# Patient Record
Sex: Male | Born: 1940
Health system: Southern US, Community
[De-identification: ages and names within clinical notes are randomized; demographics above are authoritative.]

## PROBLEM LIST (undated history)

## (undated) DIAGNOSIS — K219 Gastro-esophageal reflux disease without esophagitis: Secondary | ICD-10-CM

## (undated) DIAGNOSIS — I10 Essential (primary) hypertension: Secondary | ICD-10-CM

## (undated) DIAGNOSIS — I499 Cardiac arrhythmia, unspecified: Secondary | ICD-10-CM

## (undated) DIAGNOSIS — N1330 Unspecified hydronephrosis: Secondary | ICD-10-CM

## (undated) DIAGNOSIS — E119 Type 2 diabetes mellitus without complications: Secondary | ICD-10-CM

## (undated) DIAGNOSIS — E785 Hyperlipidemia, unspecified: Secondary | ICD-10-CM

## (undated) DIAGNOSIS — N201 Calculus of ureter: Secondary | ICD-10-CM

## (undated) DIAGNOSIS — S2241XA Multiple fractures of ribs, right side, initial encounter for closed fracture: Secondary | ICD-10-CM

## (undated) DIAGNOSIS — J449 Chronic obstructive pulmonary disease, unspecified: Secondary | ICD-10-CM

## (undated) DIAGNOSIS — N200 Calculus of kidney: Secondary | ICD-10-CM

## (undated) DIAGNOSIS — K5792 Diverticulitis of intestine, part unspecified, without perforation or abscess without bleeding: Secondary | ICD-10-CM

## (undated) HISTORY — DX: Essential (primary) hypertension: I10

## (undated) HISTORY — DX: Hyperlipidemia, unspecified: E78.5

## (undated) HISTORY — DX: Chronic obstructive pulmonary disease, unspecified: J44.9

## (undated) HISTORY — DX: Cardiac arrhythmia, unspecified: I49.9

## (undated) HISTORY — PX: SPINE SURGERY: SHX786

## (undated) HISTORY — DX: Calculus of kidney: N20.0

## (undated) HISTORY — PX: LUMBAR DISC SURGERY: SHX700

---

## 2000-05-02 ENCOUNTER — Inpatient Hospital Stay (HOSPITAL_COMMUNITY): Admission: EM | Admit: 2000-05-02 | Discharge: 2000-05-04 | Payer: Self-pay | Admitting: Emergency Medicine

## 2000-05-02 ENCOUNTER — Encounter: Payer: Self-pay | Admitting: *Deleted

## 2000-05-03 ENCOUNTER — Encounter: Payer: Self-pay | Admitting: *Deleted

## 2007-01-15 ENCOUNTER — Emergency Department (HOSPITAL_COMMUNITY): Admission: EM | Admit: 2007-01-15 | Discharge: 2007-01-15 | Payer: Self-pay | Admitting: Emergency Medicine

## 2007-01-15 IMAGING — CR DG ANKLE COMPLETE 3+V*R*
3 series · 3 of 3 positions shown · non-contrast
Comparison: none

CLINICAL DATA: 65 year-old fell.  Right ankle pain.
 RIGHT ANKLE ? 3 VIEW:
 There is lateral soft tissue swelling.  The ankle mortise is maintained.  There is a tiny rounded density noted anterior at the tibiotalar joint space which is probably an old avulsion injury or possibly unfused secondary ossification center.  Vascular calcifications noted.  Calcaneal spurs are noted.

[view not recorded (1 of 3)]
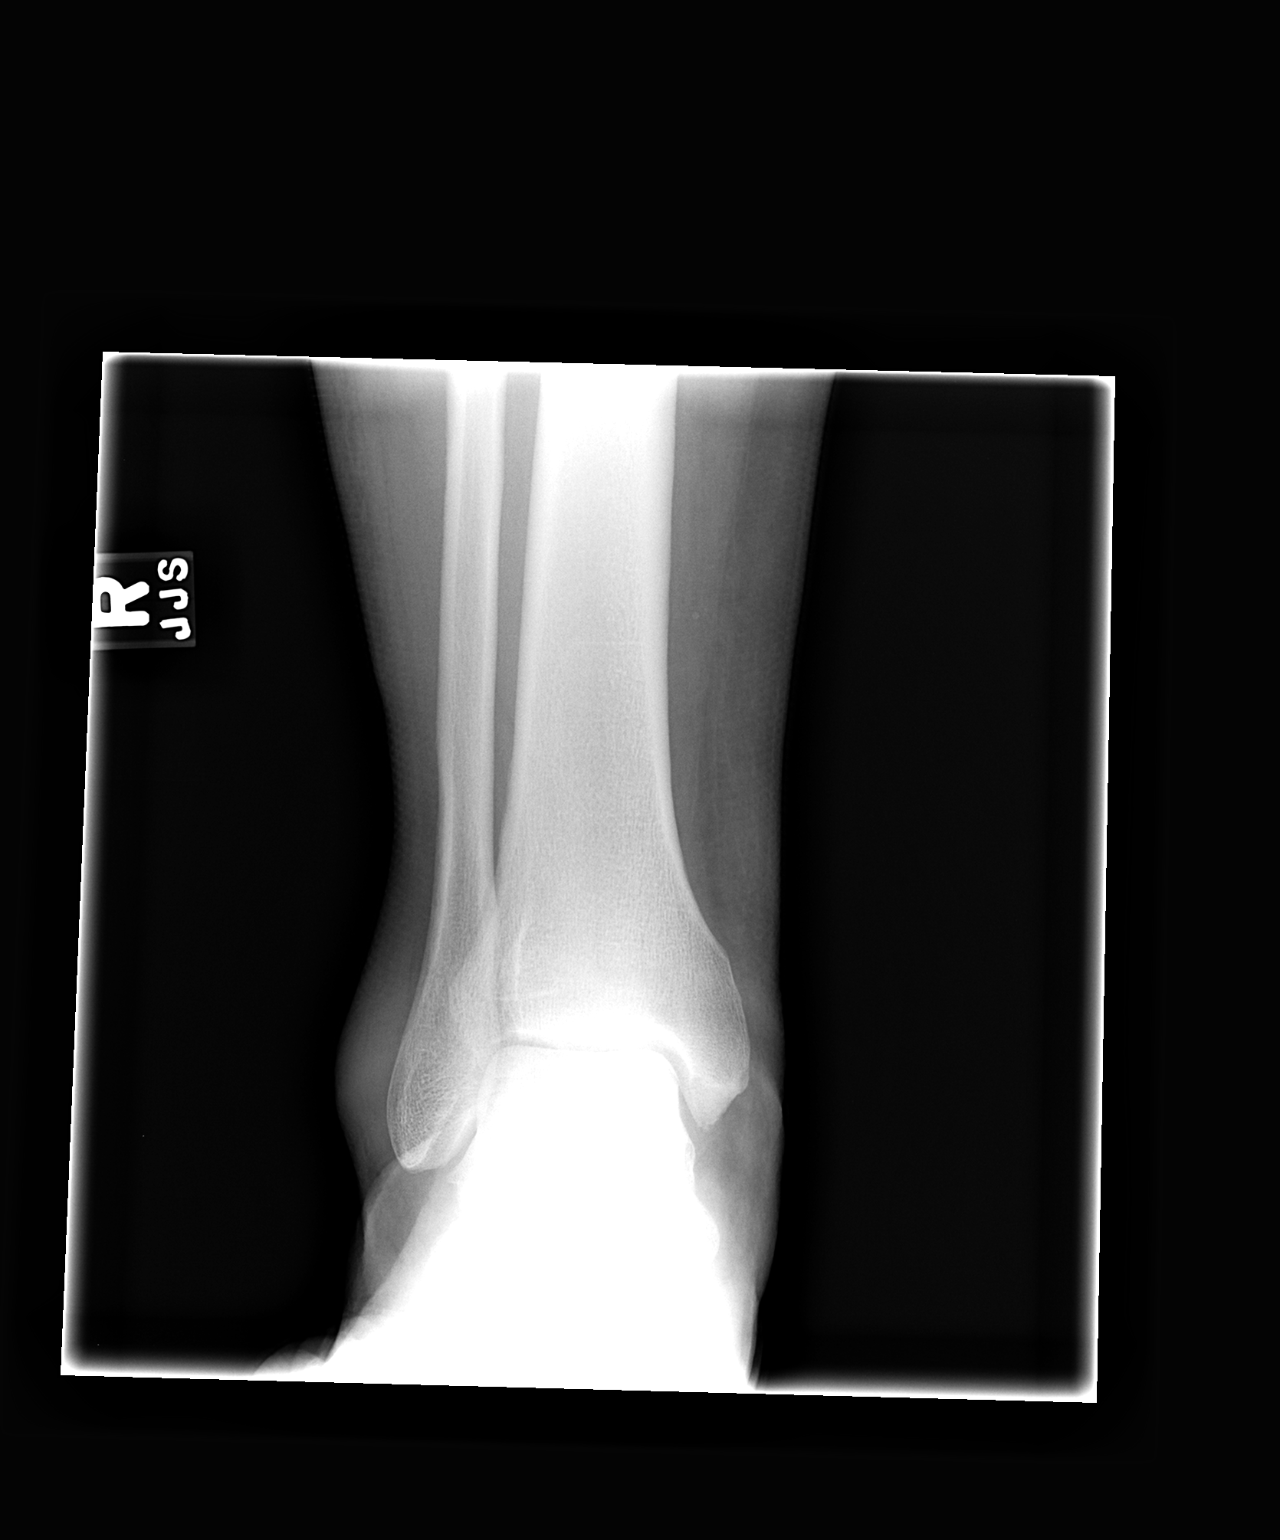

[view not recorded (2 of 3)]
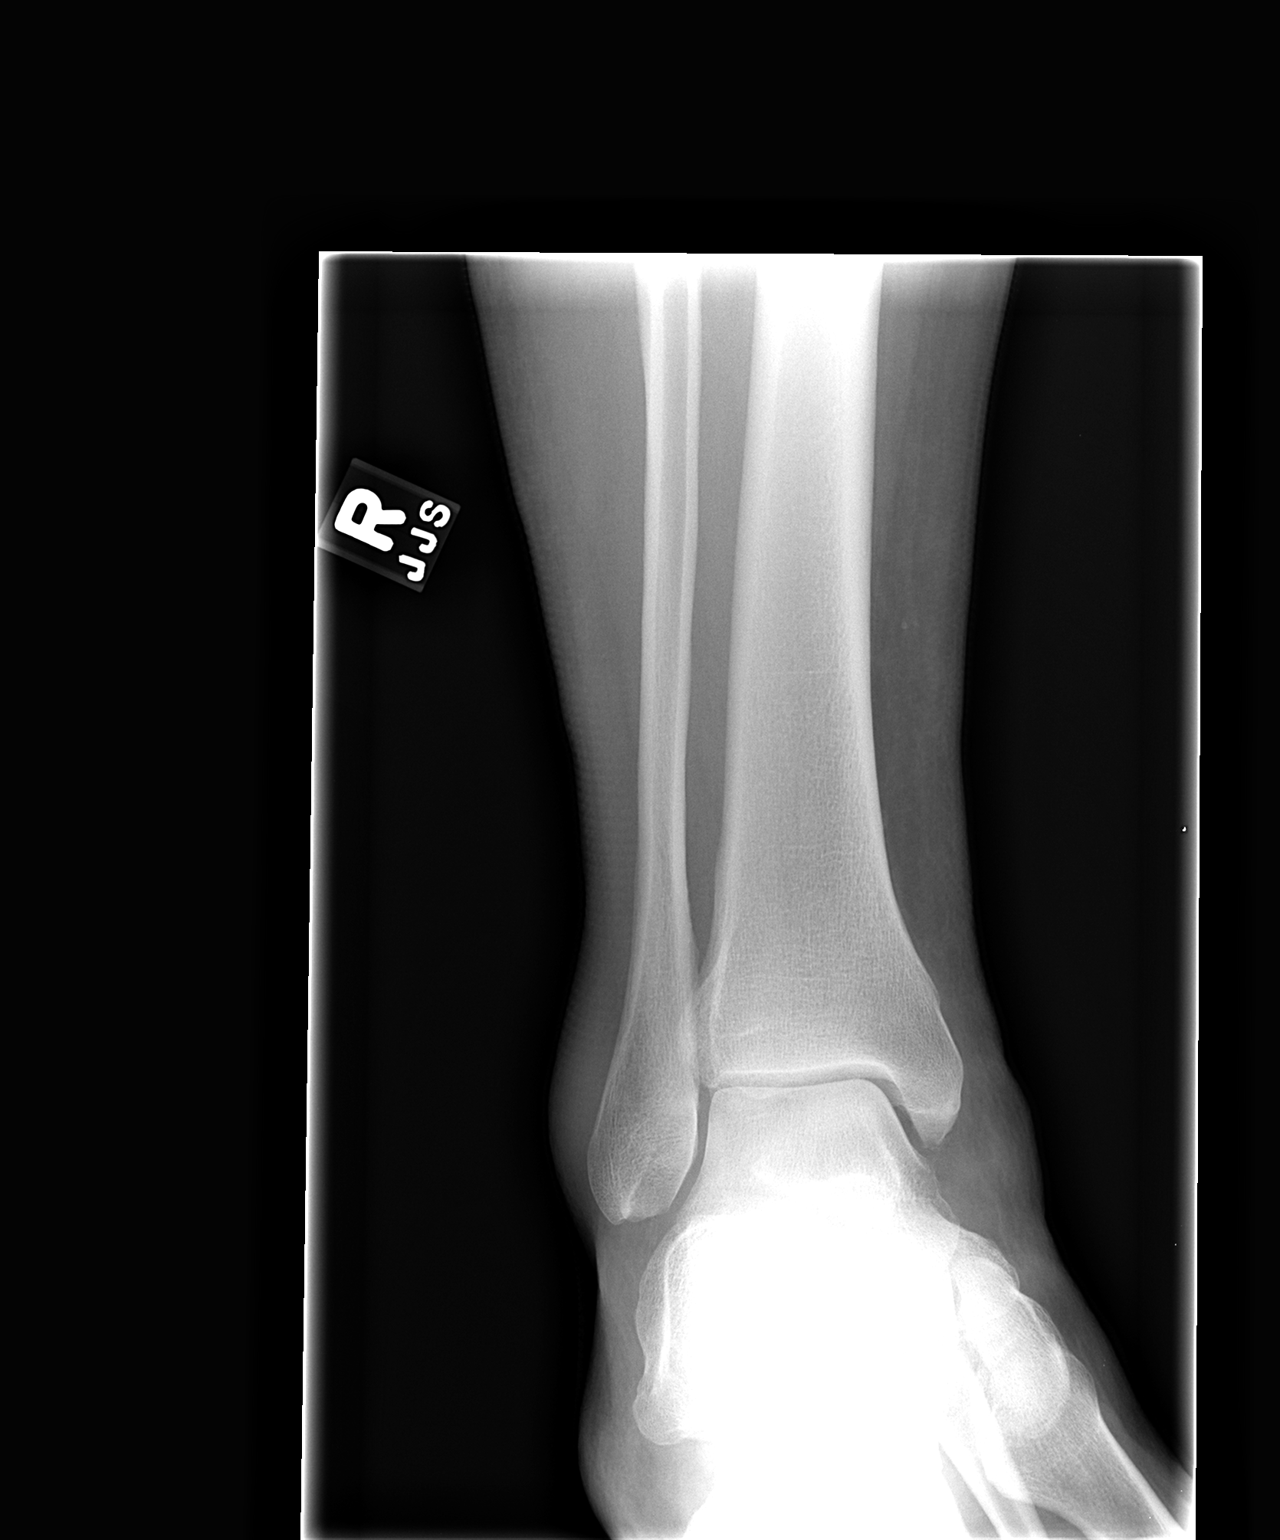

[view not recorded (3 of 3)]
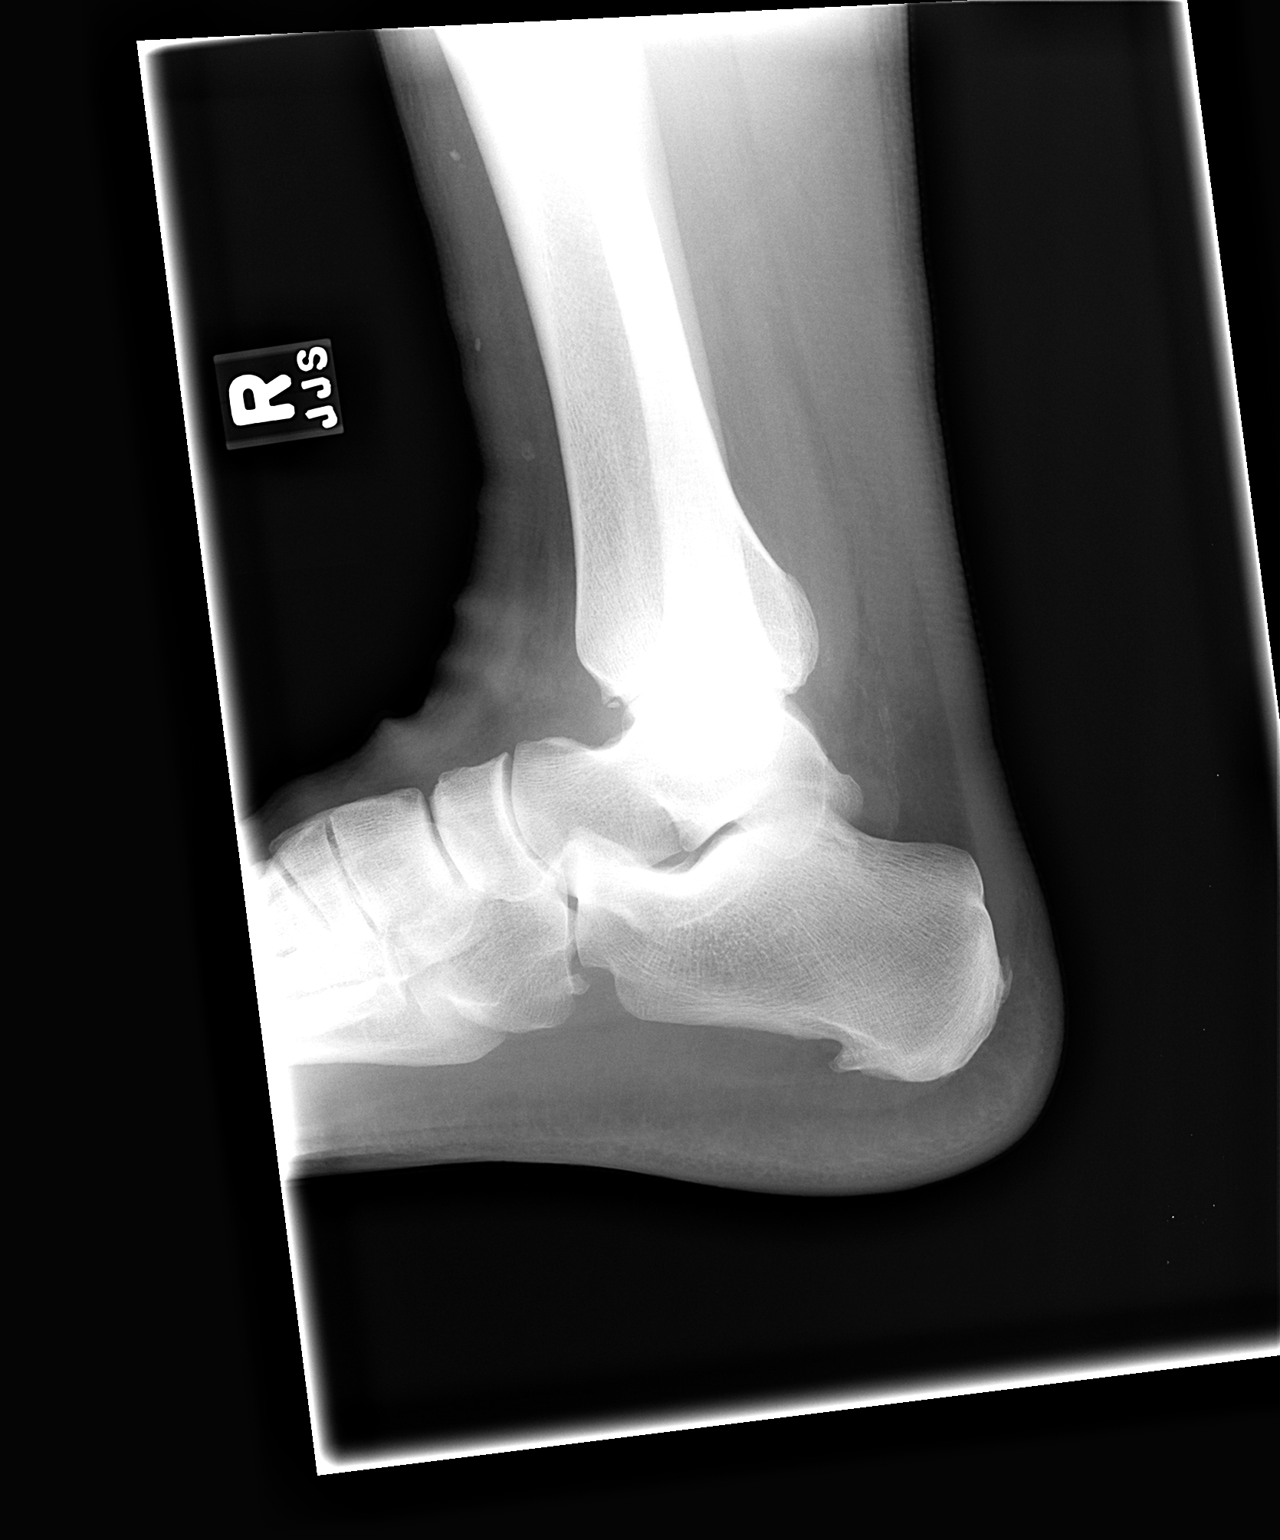

[3 of 3 positions shown; findings below may reference images not displayed]

IMPRESSION: 1.  No acute bony findings.  Lateral soft tissue swelling.

## 2007-12-09 ENCOUNTER — Ambulatory Visit: Payer: Self-pay | Admitting: Cardiology

## 2009-03-27 ENCOUNTER — Ambulatory Visit (HOSPITAL_COMMUNITY): Admission: RE | Admit: 2009-03-27 | Discharge: 2009-03-27 | Payer: Self-pay | Admitting: Family Medicine

## 2009-03-27 IMAGING — CT CT PELVIS W/O CM
2 of 4 series · 14 of 32 positions shown, 19 images · non-contrast
Comparison: None

CT ABDOMEN

CLINICAL DATA: Right flank pain.  Hematuria.

CT ABDOMEN AND PELVIS WITHOUT CONTRAST
TECHNIQUE: Multidetector CT imaging of the abdomen and pelvis was
performed following the standard protocol without intravenous
contrast.

[Series 2: start above diaphragm · axial · 0.98mm/px · z∈[-454,-154]mm · 6 of 84 slices shown, 11 images]
[im 12/84  soft-tissue]
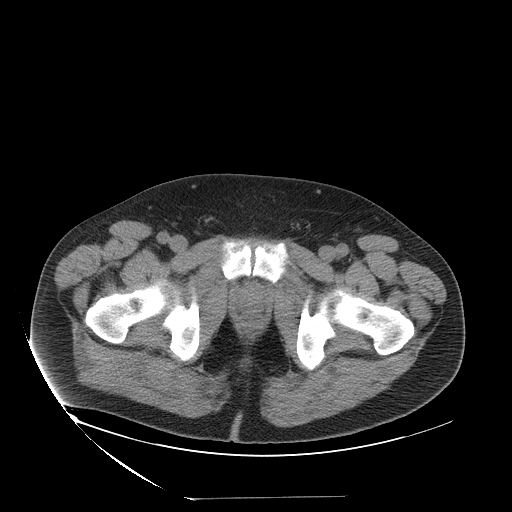
[im 12/84  bone]
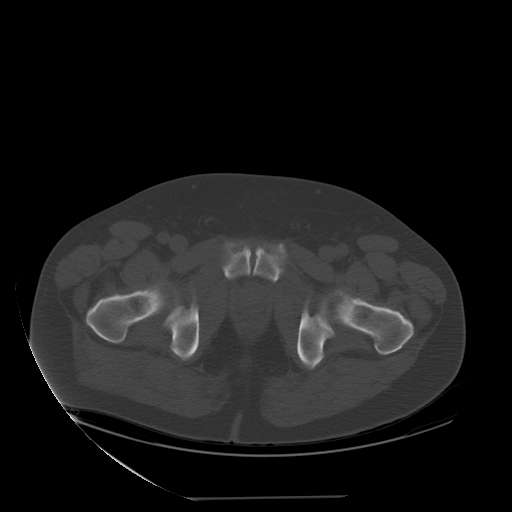
[im 24/84  soft-tissue]
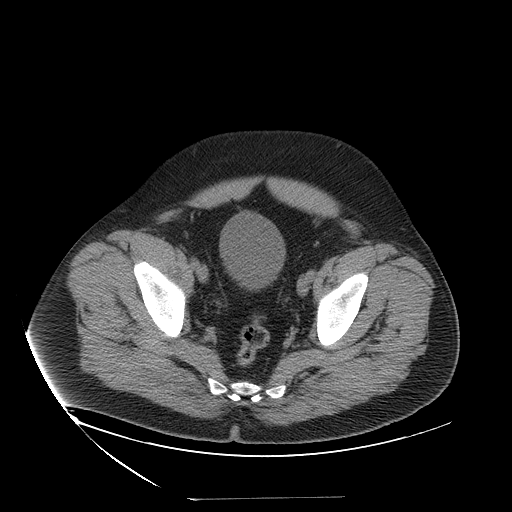
[im 36/84  soft-tissue]
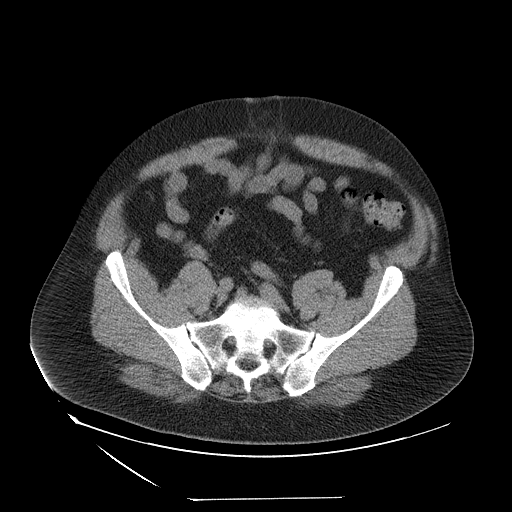
[im 36/84  lung]
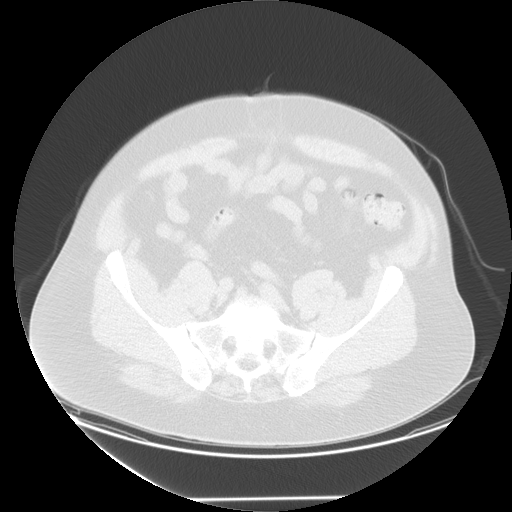
[im 48/84  soft-tissue]
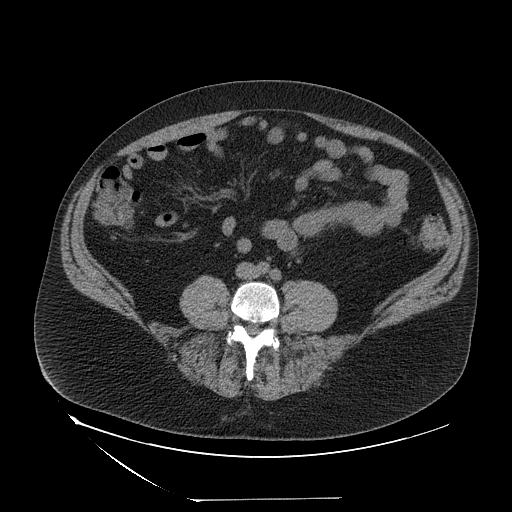
[im 48/84  lung]
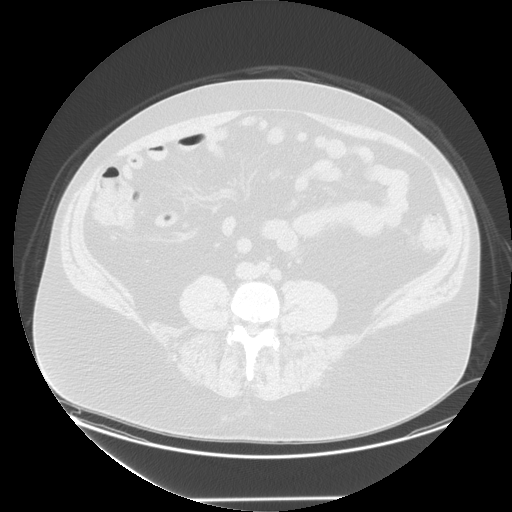
[im 60/84  soft-tissue]
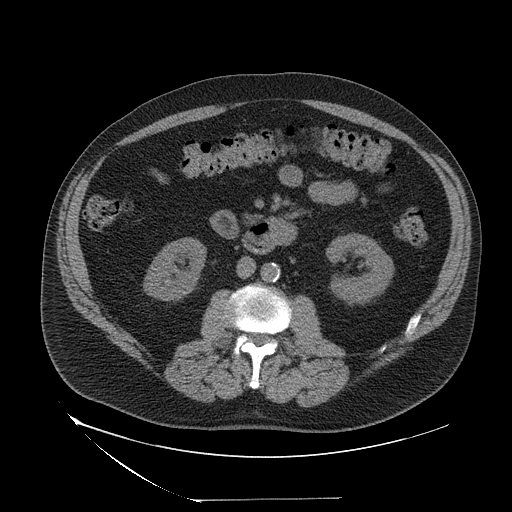
[im 60/84  lung]
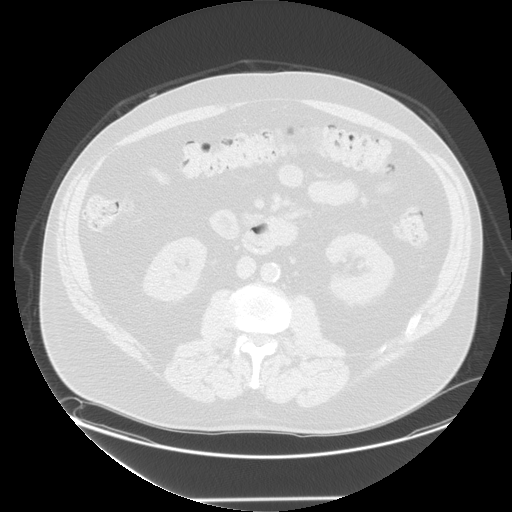
[im 72/84  soft-tissue]
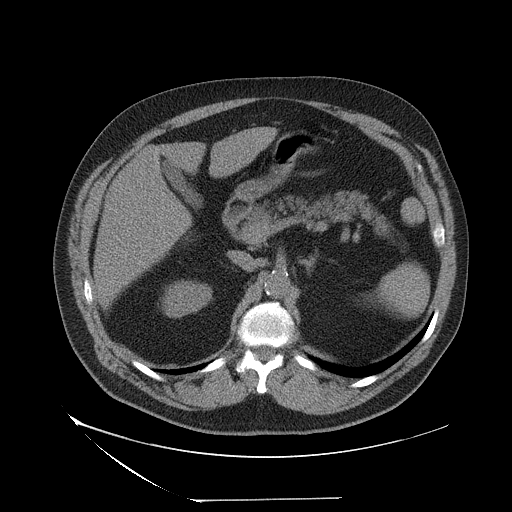
[im 72/84  lung]
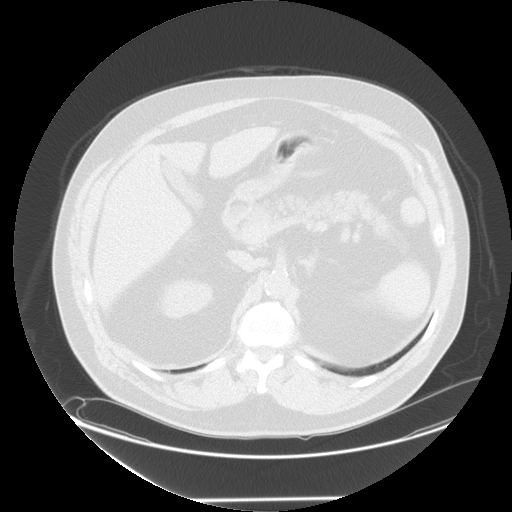

[Series 400: sagittal a/p · sagittal · 0.98mm/px · 8 of 114 slices shown]
[im 11/114  soft-tissue]
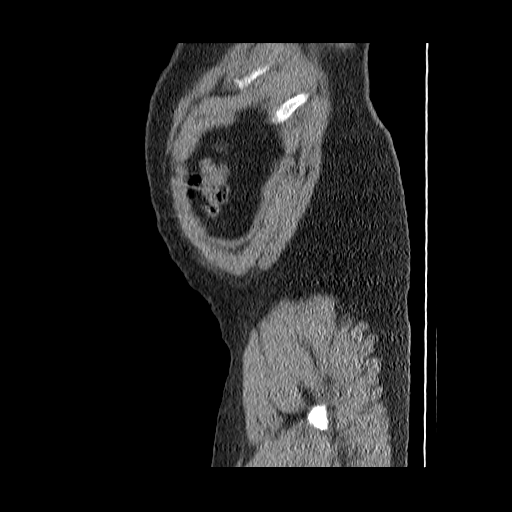
[im 21/114  soft-tissue]
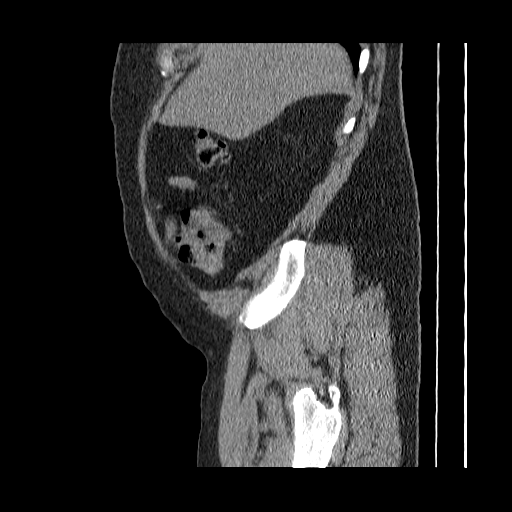
[im 42/114  soft-tissue]
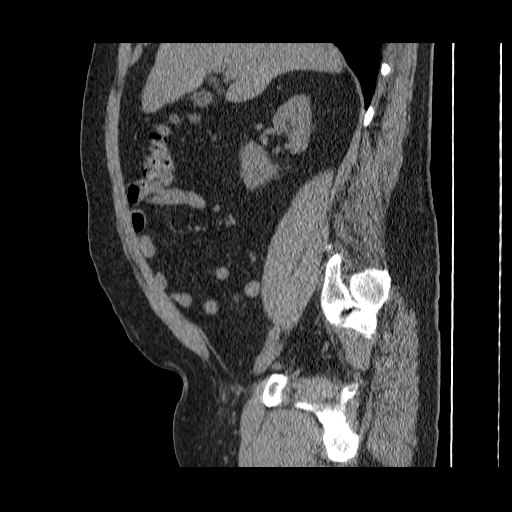
[im 52/114  soft-tissue]
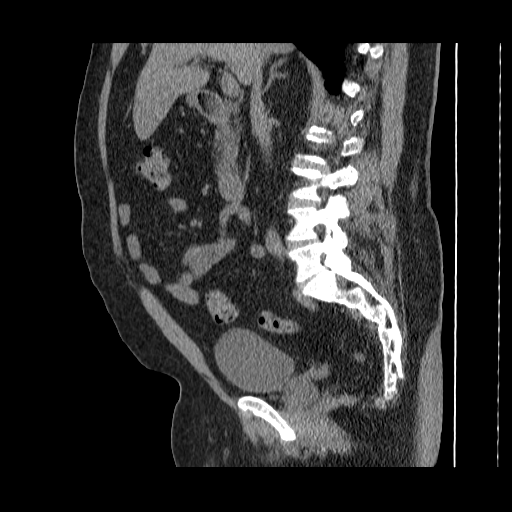
[im 62/114  soft-tissue]
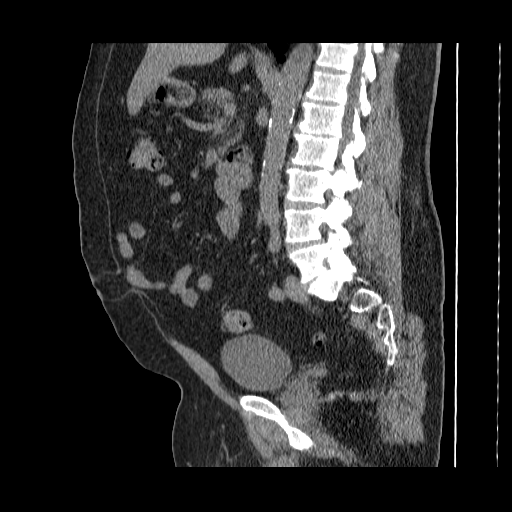
[im 72/114  soft-tissue]
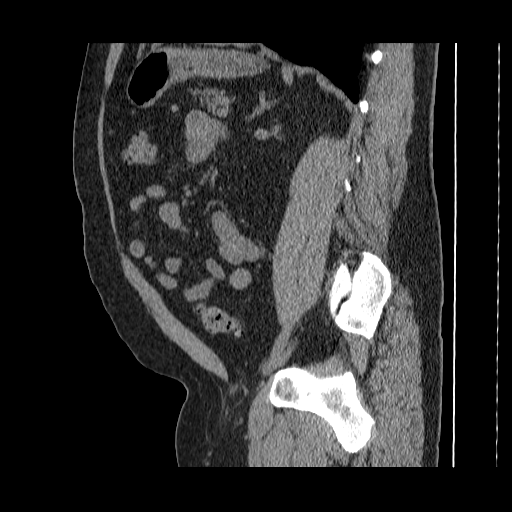
[im 93/114  soft-tissue]
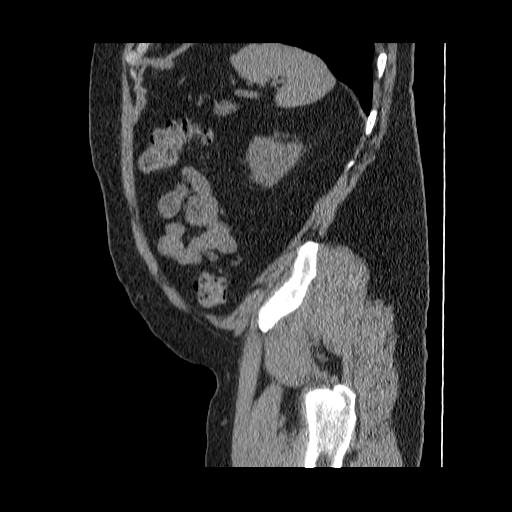
[im 103/114  soft-tissue]
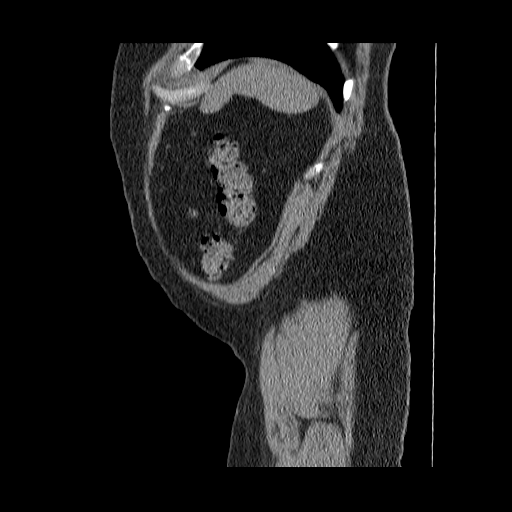

[14 of 32 positions shown; findings below may reference images not displayed]

FINDINGS: Visualized lung bases clear.  Unremarkable uninfused
evaluation of visualized portions of liver and spleen, nondistended
gallbladder, pancreas, adrenal glands, kidneys.  Negative for
nephrolithiasis or hydronephrosis.  Small bowel decompressed.
Small umbilical hernia.  No free air.  No ascites.  Mild multilevel
spondylitic changes in the lumbar spine.  Atheromatous nondilated
aorta.  No mesenteric or retroperitoneal adenopathy.
IMPRESSION: 1.  Negative for nephrolithiasis or hydronephrosis.
2.  Aortic and lumbar spine degenerative changes as above.

CT PELVIS
FINDINGS: Distal ureters decompressed without calculus.  Urinary
bladder incompletely distended.  Mild prostatic enlargement with
central calcifications.  Normal appendix.  The colon is nondilated
with a few scattered diverticula; no adjacent inflammatory
edematous change.  No free fluid.  No adenopathy.
IMPRESSION: 1.  Negative for distal ureteral calculus.
2.  Normal appendix.
3.  Scattered colonic diverticula.

## 2009-04-04 HISTORY — PX: OTHER SURGICAL HISTORY: SHX169

## 2011-04-02 ENCOUNTER — Encounter: Payer: Self-pay | Admitting: Nurse Practitioner

## 2011-04-02 DIAGNOSIS — N400 Enlarged prostate without lower urinary tract symptoms: Secondary | ICD-10-CM

## 2011-04-02 DIAGNOSIS — E559 Vitamin D deficiency, unspecified: Secondary | ICD-10-CM | POA: Insufficient documentation

## 2011-04-02 DIAGNOSIS — I10 Essential (primary) hypertension: Secondary | ICD-10-CM | POA: Insufficient documentation

## 2011-04-02 DIAGNOSIS — J449 Chronic obstructive pulmonary disease, unspecified: Secondary | ICD-10-CM

## 2011-04-02 DIAGNOSIS — K2970 Gastritis, unspecified, without bleeding: Secondary | ICD-10-CM | POA: Insufficient documentation

## 2011-04-02 DIAGNOSIS — E119 Type 2 diabetes mellitus without complications: Secondary | ICD-10-CM | POA: Insufficient documentation

## 2011-04-02 DIAGNOSIS — E785 Hyperlipidemia, unspecified: Secondary | ICD-10-CM

## 2011-04-02 DIAGNOSIS — F419 Anxiety disorder, unspecified: Secondary | ICD-10-CM

## 2011-04-02 DIAGNOSIS — E1159 Type 2 diabetes mellitus with other circulatory complications: Secondary | ICD-10-CM | POA: Insufficient documentation

## 2011-04-02 DIAGNOSIS — E1169 Type 2 diabetes mellitus with other specified complication: Secondary | ICD-10-CM | POA: Insufficient documentation

## 2011-04-02 DIAGNOSIS — K5790 Diverticulosis of intestine, part unspecified, without perforation or abscess without bleeding: Secondary | ICD-10-CM | POA: Insufficient documentation

## 2011-05-13 NOTE — Assessment & Plan Note (Signed)
[facility]                            CARDIOLOGY OFFICE NOTE   NAME:Vincent Collins, CHARLIS HARNER                        MRN:          [id number]  DATE:12/09/2007                            DOB:          02-10-1941    PRIMARY:  Lindaann Pascal, PA, [facility].   REASON FOR PRESENTATION:  Evaluate the patient with palpitations and  dyspnea.   HISTORY OF PRESENT ILLNESS:  The patient is 70 years old.  He was last  seen in this office in 2001 for evaluation of chest discomfort.  He did  have a cardiac catheterization at that time and had an LAD 25% stenosis  followed by mid 30% stenosis.  The circumflex was normal.  The right  coronary artery had 20% stenosis.  No further cardiovascular testing was  indicated.  He has not been seen by cardiologist since then.   He gets along relatively well.  However, he told Mr. Jacqulyn Bath that, when he  lies on his left side at night, he can feel his heart beating.  He says  sometimes it is fast.  He has to try to lie on his right side, which is  a problem because it causes some back pain.  However, if he does get  these, he just rolls over and he can fall asleep.  He does not notice  any palpitations during the day and has no pre-syncope or syncope.  He  does get dyspnea walking up the incline in his yard.  This has been  slowly progressive.  He does not, however, get any chest pressure, neck  or arm discomfort.  He gets no resting shortness of breath.  Denies any  PND or orthopnea.   PAST MEDICAL HISTORY:  Nonobstructive coronary artery disease,  hypertension.   PAST SURGICAL HISTORY:  Back surgery in 1989.   ALLERGIES:  None.   CURRENT MEDICATIONS:  1. Alprazolam 0.5 mg nightly.  2. Aspirin 325 mg daily.  3. Amlodipine 5 mg daily.  4. Over-the-counter acid reducer.   SOCIAL HISTORY:  The patient is retired.  He is married.  He has 2  children.  He quit smoking 40 years ago after smoking 1 pack per day  for  20 years.  He does not drink alcohol.   FAMILY HISTORY:  Contributory for his father apparently dying of  myocardial infarction at age 35, before he was born (I do not have the  details of this).  He did have 1 brother with alcohol abuse who died of  a myocardial infarction in his 64s.   REVIEW OF SYSTEMS:  As stated in the HPI and positive for occasional  headaches, glasses, snoring, reflux.  Negative for other systems.   PHYSICAL EXAMINATION:  The patient is in no distress.  Blood pressure 142/92, heart rate 73 and regular, weight 248 pounds,  body mass index 33.  HEENT:  Eyelids unremarkable.  Pupils are equal, round, and reactive to  light.  Fundi are not visualized.  Oral mucosa unremarkable.  NECK:  No jugular venous distension, wave form within normal limits,  carotid upstroke brisk and symmetric, no bruits, thyromegaly.  LYMPHATICS:  No cervical, axillary, or inguinal adenopathy.  LUNGS:  Clear to auscultation bilaterally.  BACK:  No costovertebral angle tenderness.  CHEST:  Unremarkable.  HEART:  PMI not displaced or sustained, S1 and S2 within normal limits,  no S3, no S4, no clicks, rubs, murmurs.  ABDOMEN:  Morbidly obese, positive bowel sounds, normal in frequency and  pitch, no bruits, rebound, guarding.  No midline pulsatile mass,  hepatomegaly, splenomegaly.  SKIN:  No rashes, no nodules.  EXTREMITIES:  With 2+ pulses, no edema.  NEURO:  Oriented to person, place, and time, cranial nerves 2-12 grossly  intact, motor grossly intact.   ASSESSMENT AND PLAN:  1. Dyspnea.  The patient does have dyspnea, but certainly has reasons      with his morbid obesity and his lack of exercise.  I think the pre-      test probability that he has worsening obstructive coronary artery      disease is in the low moderate range.  I think screening him with      an exercise treadmill test would be reasonable and we will call Mr.      Jacqulyn Bath to see if they can arrange this at  [facility].  2. Palpitations.  The patient describes a rapid heart rate when he      lies on his left side at night.  He does not otherwise describe any      symptoms.  I doubt that he has any dysrhythmia that would require      therapy.  Certainly, if he has any symptoms during the day or at      other times, pre-syncope, or syncope, he will need to be      reevaluated, perhaps with a monitor.  3. Obesity.  We spent a long time discussing this.  He understands the      need to lose weight with diet and exercise.  4. Hypertension.  Blood pressure is slightly elevated.  However, he      says when he has gotten checked recently, it has been in the 120s.      He certainly needs weight loss, and I bet this would help him      always achieve his target blood pressure.  5. Followup.  I will see the patient back as needed.     Rollene Rotunda, MD, [facility]  Electronically Signed    JH/MedQ  DD: 12/09/2007  DT: 12/09/2007  Job #: [id number]   cc:   Lindaann Pascal, PA

## 2011-05-16 NOTE — Cardiovascular Report (Signed)
[place]  Patient:    Vincent Collins, Vincent Collins                        MRN: [id number] Proc. Date: 05/04/00 Adm. Date:  [id number] Disc. Date: [id number] Attending:  Veneda Melter CC:         Colon Flattery, D.O.             Veneda Melter, M.D. [facility]             Cardiac Catheterization Laboratory                        Cardiac Catheterization  PROCEDURES PERFORMED:  Left heart catheterization with coronary angiography and left ventriculography.  INDICATIONS:  Mr. Maertens is a 70 year old male, admitted with chest pain worrisome for unstable angina.  He was referred for cardiac catheterization by Dr. Chales Abrahams.  DESCRIPTION OF PROCEDURE:  A 6 French sheath was placed in the right femoral artery.  Because of some bleeding around the sheath we had to up-size that to a 7 [place] sheath.  Catheters utilized included a 6 [place] JL5, 6 [place] JR4 and 6 [place] angled pigtail. Contrast was Omnipaque.  There were no complications. RESULTS:  HEMODYNAMICS:  Left ventricular pressure 130/15.  Aortic pressure 130/84. There is no aortic valve gradient.  LEFT VENTRICULOGRAM:  Left ventriculogram wall motion appears normal. Ejection fraction estimated at 55% (calculated at 53%).  CORONARY ARTERIOGRAPHY:  (Right dominant).  Left main:  Normal.  Left anterior descending:  The LAD has a 25% stenosis at its ostium and a 30% stenosis in the mid vessel just after the origin of the first diagonal.  There was a large first diagonal and a small second diagonal.  Left circumflex:  The left circumflex gives rise to a normal sized ramus intermedius, small OM-1, small OM-2 and a large OM-3.  Left circumflex is free of angiographic disease.  Right coronary artery:  The right coronary artery has a 20% stenosis in the mid vessel. It gives rise to a large acute marginal which supplies the distal portion of the inferior septum. There is a small posterior descending artery, small first  posterolateral branch and a normal sized second posterolateral branch.  IMPRESSION: 1. Left ventricular systolic function at lower limits of normal. 2. Insignificant coronary artery disease. DD:  05/04/00 TD:  05/06/00 Job: [id number]

## 2011-05-16 NOTE — Discharge Summary (Signed)
[place]  Patient:    Vincent Collins, Vincent Collins                        MRN: [id number] Adm. Date:  [id number] Disc. Date: 05/04/00 Attending:  Veneda Melter Dictator:   Leonides Cave, P.A. CC:         Daisey Must, M.D.             [facility] [place] [place], D.O.                           Discharge Summary  DATE OF BIRTH:  January 29, 1941  DISCHARGE DIAGNOSES: 1. Insignificant coronary artery disease with normal left ventricular function    on cardiac catheterization May 04, 2000. 2. Chest pain of noncardiac origin.  BRIEF HISTORY:  The patient is a 70 year old black male with history of tobacco abuse, hypertriglyceridemia, and family history of coronary disease. The patient was admitted to [facility] on May 02, 2000, for chest pain which actually started around five years ago.  It is described as substernal chest tightness.  However, over the past week and one-half, it has been occurring at rest and becoming more frequent and severe.  There is also associated shortness of breath and palpitations.  The patient denies diaphoresis, nausea, vomiting, or referred pain.  The patient had a treadmill test that was negative for ischemia due to similar symptoms about 10 to 15 years ago.  The patient was admitted to rule out coronary artery disease and was started on aspirin, Lovenox, and beta blockers and set up for cardiac catheterization.  HOSPITAL COURSE:   The patient was taken to the catheterization lab on May 04, 2000, by Dr. Loraine Leriche Pulsipher.  Results are left main normal, LAD with ostial 25% lesion, mid 30% lesion.  The left circumflex was normal.  The RCA had a mid 20% lesion.  The impression was: 1) Normal to mild decrease in LV ejection fraction with EF of 55%.  2) Insignificant coronary artery disease. The patient tolerated the procedure very well.  The patient was discharged home later on in the  evening of May 04, 2000, in stable condition.  DISCHARGE MEDICATIONS:  The patient was given a prescription for Zantac 150 mg b.i.d. for 4 weeks.  LABORATORY DATA:  The patients white count was 9.2, hemoglobin 17.2, hematocrit 46.9, platelet count 200, sodium 138, potassium 4.1, chloride 103, CO2 28, glucose 99, BUN 18, creatinine 1.0.  Cardiac enzymes were all negative.  Total cholesterol 202, triglycerides 413, HDL 29.  ACTIVITY:  The patient was told to undergo no heavy lifting, driving, or sexual activity for two days post catheterization.  DIET:  He was told to adhere to a low-fat, low-cholesterol diet.  WOUND CARE:  He was told that if bleeding or swelling occurred at the catheterization site, he should call [facility] office immediately.  FOLLOWUP:  He is to follow up with his primary care physician, Dr. Dewaine Conger, in two to four weeks.  His adverse cholesterol panel needs some attention in the future. DD:  05/04/00 TD:  05/04/00 Job: [id number]

## 2013-03-24 ENCOUNTER — Encounter: Payer: Self-pay | Admitting: *Deleted

## 2013-03-25 ENCOUNTER — Other Ambulatory Visit: Payer: Self-pay | Admitting: Family Medicine

## 2013-03-30 ENCOUNTER — Other Ambulatory Visit: Payer: Self-pay | Admitting: *Deleted

## 2013-03-30 MED ORDER — ATORVASTATIN CALCIUM 40 MG PO TABS: 40.0000 mg | ORAL_TABLET | Freq: Every day | ORAL | Status: DC

## 2013-04-20 ENCOUNTER — Other Ambulatory Visit: Payer: Self-pay | Admitting: Family Medicine

## 2013-04-21 ENCOUNTER — Other Ambulatory Visit: Payer: Self-pay | Admitting: *Deleted

## 2013-04-21 MED ORDER — NIACIN ER (ANTIHYPERLIPIDEMIC) 750 MG PO TBCR: EXTENDED_RELEASE_TABLET | ORAL | Status: DC

## 2013-04-22 ENCOUNTER — Other Ambulatory Visit: Payer: Self-pay

## 2013-04-22 MED ORDER — BENAZEPRIL HCL 20 MG PO TABS: 20.0000 mg | ORAL_TABLET | Freq: Every day | ORAL | Status: DC

## 2013-05-17 ENCOUNTER — Other Ambulatory Visit: Payer: Self-pay | Admitting: Nurse Practitioner

## 2013-06-22 ENCOUNTER — Other Ambulatory Visit: Payer: Self-pay | Admitting: Nurse Practitioner

## 2013-07-22 ENCOUNTER — Ambulatory Visit: Payer: Self-pay | Admitting: General Practice

## 2013-07-27 ENCOUNTER — Other Ambulatory Visit: Payer: Self-pay | Admitting: Nurse Practitioner

## 2013-07-27 ENCOUNTER — Other Ambulatory Visit: Payer: Self-pay | Admitting: Family Medicine

## 2013-07-28 ENCOUNTER — Ambulatory Visit (INDEPENDENT_AMBULATORY_CARE_PROVIDER_SITE_OTHER): Payer: Medicare Other | Admitting: General Practice

## 2013-07-28 ENCOUNTER — Encounter: Payer: Self-pay | Admitting: General Practice

## 2013-07-28 VITALS — BP 142/82 | HR 72 | Temp 96.9°F | Ht 68.0 in | Wt 267.0 lb

## 2013-07-28 DIAGNOSIS — R7989 Other specified abnormal findings of blood chemistry: Secondary | ICD-10-CM

## 2013-07-28 DIAGNOSIS — I10 Essential (primary) hypertension: Secondary | ICD-10-CM

## 2013-07-28 DIAGNOSIS — J449 Chronic obstructive pulmonary disease, unspecified: Secondary | ICD-10-CM

## 2013-07-28 DIAGNOSIS — R7309 Other abnormal glucose: Secondary | ICD-10-CM

## 2013-07-28 DIAGNOSIS — E785 Hyperlipidemia, unspecified: Secondary | ICD-10-CM

## 2013-07-28 LAB — POCT CBC
Granulocyte percent: 62.8 %G (ref 37–80)
HCT, POC: 48 % (ref 43.5–53.7)
Hemoglobin: 16.3 g/dL (ref 14.1–18.1)
MCV: 88.1 fL (ref 80–97)
POC Granulocyte: 4.3 (ref 2–6.9)
RBC: 5.5 M/uL (ref 4.69–6.13)
WBC: 6.9 10*3/uL (ref 4.6–10.2)

## 2013-07-28 MED ORDER — NIACIN ER (ANTIHYPERLIPIDEMIC) 750 MG PO TBCR: 750.0000 mg | EXTENDED_RELEASE_TABLET | Freq: Every day | ORAL | Status: DC

## 2013-07-28 MED ORDER — ATORVASTATIN CALCIUM 40 MG PO TABS: 40.0000 mg | ORAL_TABLET | Freq: Every day | ORAL | Status: DC

## 2013-07-28 MED ORDER — AMLODIPINE BESYLATE 5 MG PO TABS: 5.0000 mg | ORAL_TABLET | Freq: Every day | ORAL | Status: DC

## 2013-07-28 MED ORDER — BENAZEPRIL HCL 20 MG PO TABS: 20.0000 mg | ORAL_TABLET | Freq: Every day | ORAL | Status: DC

## 2013-07-28 MED ORDER — ALBUTEROL SULFATE HFA 108 (90 BASE) MCG/ACT IN AERS: 2.0000 | INHALATION_SPRAY | Freq: Four times a day (QID) | RESPIRATORY_TRACT | Status: DC | PRN

## 2013-07-28 NOTE — Progress Notes (Signed)
  Subjective:    Patient ID: Vincent Collins, male    DOB: 05-Sep-1941, 72 y.o.   MRN: [id number]  HPI Patient presents today for follow of chronic health conditions. He has history of hypertension, COPD, hyperlipidemia, and BPH. He reports taking most medications as prescribed, but denies taking flomax. He denies having trouble with urination or urine flow. He denies regular exercise or healthy eating.     Review of Systems  Constitutional: Negative for fever and chills.  HENT: Negative for ear pain, neck pain and neck stiffness.   Respiratory: Negative for cough, chest tightness and shortness of breath.   Cardiovascular: Negative for chest pain and palpitations.  Gastrointestinal: Negative for vomiting, diarrhea and blood in stool.  Genitourinary: Negative for hematuria and difficulty urinating.  Neurological: Negative for dizziness, weakness and headaches.       Objective:   Physical Exam  Constitutional: He is oriented to person, place, and time. He appears well-developed and well-nourished.  HENT:  Head: Normocephalic and atraumatic.  Right Ear: External ear normal.  Left Ear: External ear normal.  Mouth/Throat: Oropharynx is clear and moist.  Eyes: EOM are normal. Pupils are equal, round, and reactive to light.  Neck: Normal range of motion. Neck supple. No thyromegaly present.  Cardiovascular: Normal rate, regular rhythm and normal heart sounds.   Pulmonary/Chest: Effort normal and breath sounds normal. No respiratory distress. He exhibits no tenderness.  Abdominal: Soft. Bowel sounds are normal. He exhibits no distension. There is no tenderness.  Obese abdomen  Lymphadenopathy:    He has no cervical adenopathy.  Neurological: He is alert and oriented to person, place, and time.  Skin: Skin is warm and dry.  Psychiatric: He has a normal mood and affect.          Assessment & Plan:  1. Essential hypertension, benign - POCT CBC - CMP14+EGFR - benazepril (LOTENSIN) 20  MG tablet; Take 1 tablet (20 mg total) by mouth daily.  Dispense: 30 tablet; Refill: 3 - amLODipine (NORVASC) 5 MG tablet; Take 1 tablet (5 mg total) by mouth daily.  Dispense: 30 tablet; Refill: 3  2. Other and unspecified hyperlipidemia - NMR, lipoprofile - niacin (NIASPAN) 750 MG CR tablet; Take 1 tablet (750 mg total) by mouth at bedtime.  Dispense: 30 tablet; Refill: 2 - atorvastatin (LIPITOR) 40 MG tablet; Take 1 tablet (40 mg total) by mouth daily.  Dispense: 90 tablet; Refill: 0  3. COPD (chronic obstructive pulmonary disease) - albuterol (PROAIR HFA) 108 (90 BASE) MCG/ACT inhaler; Inhale 2 puffs into the lungs every 6 (six) hours as needed for wheezing.  Dispense: 8.5 g; Refill: 0  4. Elevated hemoglobin A1c - POCT glycosylated hemoglobin (Hb A1C) -Continue all current medications Labs pending F/u in 3 months and sooner if symptoms develops Discussed exercise and diet  Patient verbalized understanding Coralie Keens, FNP-C

## 2013-07-28 NOTE — Patient Instructions (Addendum)
Hypertriglyceridemia  Diet for High blood levels of Triglycerides Most fats in food are triglycerides. Triglycerides in your blood are stored as fat in your body. High levels of triglycerides in your blood may put you at a greater risk for heart disease and stroke.  Normal triglyceride levels are less than 150 mg/dL. Borderline high levels are 150-199 mg/dl. High levels are 200 - 499 mg/dL, and very high triglyceride levels are greater than 500 mg/dL. The decision to treat high triglycerides is generally based on the level. For people with borderline or high triglyceride levels, treatment includes weight loss and exercise. Drugs are recommended for people with very high triglyceride levels. Many people who need treatment for high triglyceride levels have metabolic syndrome. This syndrome is a collection of disorders that often include: insulin resistance, high blood pressure, blood clotting problems, high cholesterol and triglycerides. TESTING PROCEDURE FOR TRIGLYCERIDES  You should not eat 4 hours before getting your triglycerides measured. The normal range of triglycerides is between 10 and 250 milligrams per deciliter (mg/dl). Some people may have extreme levels (1000 or above), but your triglyceride level may be too high if it is above 150 mg/dl, depending on what other risk factors you have for heart disease.  People with high blood triglycerides may also have high blood cholesterol levels. If you have high blood cholesterol as well as high blood triglycerides, your risk for heart disease is probably greater than if you only had high triglycerides. High blood cholesterol is one of the main risk factors for heart disease. CHANGING YOUR DIET  Your weight can affect your blood triglyceride level. If you are more than 20% above your ideal body weight, you may be able to lower your blood triglycerides by losing weight. Eating less and exercising regularly is the best way to combat this. Fat provides more  calories than any other food. The best way to lose weight is to eat less fat. Only 30% of your total calories should come from fat. Less than 7% of your diet should come from saturated fat. A diet low in fat and saturated fat is the same as a diet to decrease blood cholesterol. By eating a diet lower in fat, you may lose weight, lower your blood cholesterol, and lower your blood triglyceride level.  Eating a diet low in fat, especially saturated fat, may also help you lower your blood triglyceride level. Ask your dietitian to help you figure how much fat you can eat based on the number of calories your caregiver has prescribed for you.  Exercise, in addition to helping with weight loss may also help lower triglyceride levels.   Alcohol can increase blood triglycerides. You may need to stop drinking alcoholic beverages.  Too much carbohydrate in your diet may also increase your blood triglycerides. Some complex carbohydrates are necessary in your diet. These may include bread, rice, potatoes, other starchy vegetables and cereals.  Reduce "simple" carbohydrates. These may include pure sugars, candy, honey, and jelly without losing other nutrients. If you have the kind of high blood triglycerides that is affected by the amount of carbohydrates in your diet, you will need to eat less sugar and less high-sugar foods. Your caregiver can help you with this.  Adding 2-4 grams of fish oil (EPA+ DHA) may also help lower triglycerides. Speak with your caregiver before adding any supplements to your regimen. Following the Diet  Maintain your ideal weight. Your caregivers can help you with a diet. Generally, eating less food and getting more   exercise will help you lose weight. Joining a weight control group may also help. Ask your caregivers for a good weight control group in your area.  Eat low-fat foods instead of high-fat foods. This can help you lose weight too.  These foods are lower in fat. Eat MORE of these:    Dried beans, peas, and lentils.  Egg whites.  Low-fat cottage cheese.  Fish.  Lean cuts of meat, such as round, sirloin, rump, and flank (cut extra fat off meat you fix).  Whole grain breads, cereals and pasta.  Skim and nonfat dry milk.  Low-fat yogurt.  Poultry without the skin.  Cheese made with skim or part-skim milk, such as mozzarella, parmesan, farmers', ricotta, or pot cheese. These are higher fat foods. Eat LESS of these:   Whole milk and foods made from whole milk, such as American, blue, cheddar, monterey jack, and swiss cheese  High-fat meats, such as luncheon meats, sausages, knockwurst, bratwurst, hot dogs, ribs, corned beef, ground pork, and regular ground beef.  Fried foods. Limit saturated fats in your diet. Substituting unsaturated fat for saturated fat may decrease your blood triglyceride level. You will need to read package labels to know which products contain saturated fats.  These foods are high in saturated fat. Eat LESS of these:   Fried pork skins.  Whole milk.  Skin and fat from poultry.  Palm oil.  Butter.  Shortening.  Cream cheese.  Bacon.  Margarines and baked goods made from listed oils.  Vegetable shortenings.  Chitterlings.  Fat from meats.  Coconut oil.  Palm kernel oil.  Lard.  Cream.  Sour cream.  Fatback.  Coffee whiteners and non-dairy creamers made with these oils.  Cheese made from whole milk. Use unsaturated fats (both polyunsaturated and monounsaturated) moderately. Remember, even though unsaturated fats are better than saturated fats; you still want a diet low in total fat.  These foods are high in unsaturated fat:   Canola oil.  Sunflower oil.  Mayonnaise.  Almonds.  Peanuts.  Pine nuts.  Margarines made with these oils.  Safflower oil.  Olive oil.  Avocados.  Cashews.  Peanut butter.  Sunflower seeds.  Soybean oil.  Peanut  oil.  Olives.  Pecans.  Walnuts.  Pumpkin seeds. Avoid sugar and other high-sugar foods. This will decrease carbohydrates without decreasing other nutrients. Sugar in your food goes rapidly to your blood. When there is excess sugar in your blood, your liver may use it to make more triglycerides. Sugar also contains calories without other important nutrients.  Eat LESS of these:   Sugar, brown sugar, powdered sugar, jam, jelly, preserves, honey, syrup, molasses, pies, candy, cakes, cookies, frosting, pastries, colas, soft drinks, punches, fruit drinks, and regular gelatin.  Avoid alcohol. Alcohol, even more than sugar, may increase blood triglycerides. In addition, alcohol is high in calories and low in nutrients. Ask for sparkling water, or a diet soft drink instead of an alcoholic beverage. Suggestions for planning and preparing meals   Bake, broil, grill or roast meats instead of frying.  Remove fat from meats and skin from poultry before cooking.  Add spices, herbs, lemon juice or vinegar to vegetables instead of salt, rich sauces or gravies.  Use a non-stick skillet without fat or use no-stick sprays.  Cool and refrigerate stews and broth. Then remove the hardened fat floating on the surface before serving.  Refrigerate meat drippings and skim off fat to make low-fat gravies.  Serve more fish.  Use less butter,   margarine and other high-fat spreads on bread or vegetables.  Use skim or reconstituted non-fat dry milk for cooking.  Cook with low-fat cheeses.  Substitute low-fat yogurt or cottage cheese for all or part of the sour cream in recipes for sauces, dips or congealed salads.  Use half yogurt/half mayonnaise in salad recipes.  Substitute evaporated skim milk for cream. Evaporated skim milk or reconstituted non-fat dry milk can be whipped and substituted for whipped cream in certain recipes.  Choose fresh fruits for dessert instead of high-fat foods such as pies or  cakes. Fruits are naturally low in fat. When Dining Out   Order low-fat appetizers such as fruit or vegetable juice, pasta with vegetables or tomato sauce.  Select clear, rather than cream soups.  Ask that dressings and gravies be served on the side. Then use less of them.  Order foods that are baked, broiled, poached, steamed, stir-fried, or roasted.  Ask for margarine instead of butter, and use only a small amount.  Drink sparkling water, unsweetened tea or coffee, or diet soft drinks instead of alcohol or other sweet beverages. QUESTIONS AND ANSWERS ABOUT OTHER FATS IN THE BLOOD: SATURATED FAT, TRANS FAT, AND CHOLESTEROL What is trans fat? Trans fat is a type of fat that is formed when vegetable oil is hardened through a process called hydrogenation. This process helps makes foods more solid, gives them shape, and prolongs their shelf life. Trans fats are also called hydrogenated or partially hydrogenated oils.  What do saturated fat, trans fat, and cholesterol in foods have to do with heart disease? Saturated fat, trans fat, and cholesterol in the diet all raise the level of LDL "bad" cholesterol in the blood. The higher the LDL cholesterol, the greater the risk for coronary heart disease (CHD). Saturated fat and trans fat raise LDL similarly.  What foods contain saturated fat, trans fat, and cholesterol? High amounts of saturated fat are found in animal products, such as fatty cuts of meat, chicken skin, and full-fat dairy products like butter, whole milk, cream, and cheese, and in tropical vegetable oils such as palm, palm kernel, and coconut oil. Trans fat is found in some of the same foods as saturated fat, such as vegetable shortening, some margarines (especially hard or stick margarine), crackers, cookies, baked goods, fried foods, salad dressings, and other processed foods made with partially hydrogenated vegetable oils. Small amounts of trans fat also occur naturally in some animal  products, such as milk products, beef, and lamb. Foods high in cholesterol include liver, other organ meats, egg yolks, shrimp, and full-fat dairy products. How can I use the new food label to make heart-healthy food choices? Check the Nutrition Facts panel of the food label. Choose foods lower in saturated fat, trans fat, and cholesterol. For saturated fat and cholesterol, you can also use the Percent Daily Value (%DV): 5% DV or less is low, and 20% DV or more is high. (There is no %DV for trans fat.) Use the Nutrition Facts panel to choose foods low in saturated fat and cholesterol, and if the trans fat is not listed, read the ingredients and limit products that list shortening or hydrogenated or partially hydrogenated vegetable oil, which tend to be high in trans fat. POINTS TO REMEMBER:   Discuss your risk for heart disease with your caregivers, and take steps to reduce risk factors.  Change your diet. Choose foods that are low in saturated fat, trans fat, and cholesterol.  Add exercise to your daily routine if   it is not already being done. Participate in physical activity of moderate intensity, like brisk walking, for at least 30 minutes on most, and preferably all days of the week. No time? Break the 30 minutes into three, 10-minute segments during the day.  Stop smoking. If you do smoke, contact your caregiver to discuss ways in which they can help you quit.  Do not use street drugs.  Maintain a normal weight.  Maintain a healthy blood pressure.  Keep up with your blood work for checking the fats in your blood as directed by your caregiver. Document Released: 10/02/2004 Document Revised: 06/15/2012 Document Reviewed: 04/30/2009 [employer] Patient Information 2014 [employer].  

## 2013-07-30 LAB — CMP14+EGFR
ALT: 23 IU/L (ref 0–44)
AST: 22 IU/L (ref 0–40)
Albumin/Globulin Ratio: 1.9 (ref 1.1–2.5)
Calcium: 8.9 mg/dL (ref 8.6–10.2)
Creatinine, Ser: 0.99 mg/dL (ref 0.76–1.27)
GFR calc Af Amer: 88 mL/min/{1.73_m2} (ref >59)
GFR calc non Af Amer: 76 mL/min/{1.73_m2} (ref >59)
Globulin, Total: 2.1 g/dL (ref 1.5–4.5)
Potassium: 4.3 mmol/L (ref 3.5–5.2)
Sodium: 140 mmol/L (ref 134–144)
Total Bilirubin: 0.9 mg/dL (ref 0.0–1.2)

## 2013-07-30 LAB — NMR, LIPOPROFILE
Cholesterol: 165 mg/dL (ref <200)
HDL Cholesterol by NMR: 40 mg/dL (ref >=40)
HDL Particle Number: 30.8 umol/L (ref >=30.5)
LDL Particle Number: 1671 nmol/L — ABNORMAL HIGH (ref <1000)
LDLC SERPL CALC-MCNC: 98 mg/dL (ref <100)
Triglycerides by NMR: 134 mg/dL (ref <150)

## 2013-08-02 ENCOUNTER — Other Ambulatory Visit: Payer: Self-pay | Admitting: General Practice

## 2013-08-02 DIAGNOSIS — E785 Hyperlipidemia, unspecified: Secondary | ICD-10-CM

## 2013-08-27 ENCOUNTER — Other Ambulatory Visit: Payer: Self-pay | Admitting: Family Medicine

## 2013-08-27 ENCOUNTER — Other Ambulatory Visit: Payer: Self-pay | Admitting: General Practice

## 2013-09-27 ENCOUNTER — Other Ambulatory Visit: Payer: Self-pay | Admitting: General Practice

## 2013-09-29 ENCOUNTER — Encounter: Payer: Self-pay | Admitting: *Deleted

## 2013-10-06 ENCOUNTER — Ambulatory Visit (INDEPENDENT_AMBULATORY_CARE_PROVIDER_SITE_OTHER): Payer: Medicare Other

## 2013-10-06 DIAGNOSIS — Z23 Encounter for immunization: Secondary | ICD-10-CM

## 2013-10-27 ENCOUNTER — Ambulatory Visit: Payer: Medicare Other | Admitting: General Practice

## 2013-10-28 ENCOUNTER — Ambulatory Visit: Payer: Medicare Other | Admitting: General Practice

## 2013-11-02 ENCOUNTER — Ambulatory Visit (INDEPENDENT_AMBULATORY_CARE_PROVIDER_SITE_OTHER): Payer: Medicare Other | Admitting: General Practice

## 2013-11-02 ENCOUNTER — Encounter: Payer: Self-pay | Admitting: General Practice

## 2013-11-02 VITALS — BP 127/73 | HR 70 | Temp 96.6°F | Ht 68.0 in | Wt 260.0 lb

## 2013-11-02 DIAGNOSIS — E785 Hyperlipidemia, unspecified: Secondary | ICD-10-CM

## 2013-11-02 DIAGNOSIS — D691 Qualitative platelet defects: Secondary | ICD-10-CM

## 2013-11-02 DIAGNOSIS — I10 Essential (primary) hypertension: Secondary | ICD-10-CM

## 2013-11-02 LAB — POCT CBC
Granulocyte percent: 67.6 %G (ref 37–80)
Hemoglobin: 16.2 g/dL (ref 14.1–18.1)
Lymph, poc: 1.9 (ref 0.6–3.4)
MCH, POC: 30.1 pg (ref 27–31.2)
MPV: 7.9 fL (ref 0–99.8)
POC Granulocyte: 4.9 (ref 2–6.9)
POC LYMPH PERCENT: 26.7 %L (ref 10–50)
Platelet Count, POC: 169 10*3/uL (ref 142–424)
RBC: 5.4 M/uL (ref 4.69–6.13)

## 2013-11-02 MED ORDER — AMLODIPINE BESYLATE 5 MG PO TABS: 5.0000 mg | ORAL_TABLET | Freq: Every day | ORAL | Status: DC

## 2013-11-02 MED ORDER — ATORVASTATIN CALCIUM 40 MG PO TABS: 40.0000 mg | ORAL_TABLET | Freq: Every day | ORAL | Status: DC

## 2013-11-02 MED ORDER — BENAZEPRIL HCL 20 MG PO TABS: 20.0000 mg | ORAL_TABLET | Freq: Every day | ORAL | Status: DC

## 2013-11-02 NOTE — Progress Notes (Signed)
  Subjective:    Patient ID: Vincent Collins, male    DOB: December 20, 1941, 72 y.o.   MRN: [id number]  HPI Patient presents today for follow up of hypertension. He reports trying to eat healthier (low sodium and baked foods). He reports walking for exercise. He denies taking blood pressure at home.     Review of Systems  Constitutional: Negative for fever and chills.  Respiratory: Negative for chest tightness and shortness of breath.   Cardiovascular: Negative for chest pain and palpitations.  Genitourinary: Negative for difficulty urinating.  Neurological: Negative for dizziness, weakness and headaches.       Objective:   Physical Exam  Constitutional: He is oriented to person, place, and time. He appears well-developed and well-nourished.  Cardiovascular: Normal rate, regular rhythm and normal heart sounds.   Pulmonary/Chest: Effort normal and breath sounds normal. No respiratory distress. He exhibits no tenderness.  Neurological: He is alert and oriented to person, place, and time.  Skin: Skin is warm and dry.  Psychiatric: He has a normal mood and affect.          Assessment & Plan:  1. Hypertension  - CMP14+EGFR  2. Hyperlipidemia  - NMR, lipoprofile  3. Abnormal platelets  - POCT CBC  4. Essential hypertension, benign  - amLODipine (NORVASC) 5 MG tablet; Take 1 tablet (5 mg total) by mouth daily.  Dispense: 30 tablet; Refill: 3 - benazepril (LOTENSIN) 20 MG tablet; Take 1 tablet (20 mg total) by mouth daily.  Dispense: 30 tablet; Refill: 3  5. Other and unspecified hyperlipidemia  - atorvastatin (LIPITOR) 40 MG tablet; Take 1 tablet (40 mg total) by mouth daily.  Dispense: 90 tablet; Refill: 0 -Continue all current medications Labs pending F/u in 3 months Discussed exercise and diet  Patient verbalized understanding Coralie Keens, FNP-C

## 2013-11-02 NOTE — Patient Instructions (Signed)
Hypertriglyceridemia  Diet for High blood levels of Triglycerides Most fats in food are triglycerides. Triglycerides in your blood are stored as fat in your body. High levels of triglycerides in your blood may put you at a greater risk for heart disease and stroke.  Normal triglyceride levels are less than 150 mg/dL. Borderline high levels are 150-199 mg/dl. High levels are 200 - 499 mg/dL, and very high triglyceride levels are greater than 500 mg/dL. The decision to treat high triglycerides is generally based on the level. For people with borderline or high triglyceride levels, treatment includes weight loss and exercise. Drugs are recommended for people with very high triglyceride levels. Many people who need treatment for high triglyceride levels have metabolic syndrome. This syndrome is a collection of disorders that often include: insulin resistance, high blood pressure, blood clotting problems, high cholesterol and triglycerides. TESTING PROCEDURE FOR TRIGLYCERIDES  You should not eat 4 hours before getting your triglycerides measured. The normal range of triglycerides is between 10 and 250 milligrams per deciliter (mg/dl). Some people may have extreme levels (1000 or above), but your triglyceride level may be too high if it is above 150 mg/dl, depending on what other risk factors you have for heart disease.  People with high blood triglycerides may also have high blood cholesterol levels. If you have high blood cholesterol as well as high blood triglycerides, your risk for heart disease is probably greater than if you only had high triglycerides. High blood cholesterol is one of the main risk factors for heart disease. CHANGING YOUR DIET  Your weight can affect your blood triglyceride level. If you are more than 20% above your ideal body weight, you may be able to lower your blood triglycerides by losing weight. Eating less and exercising regularly is the best way to combat this. Fat provides more  calories than any other food. The best way to lose weight is to eat less fat. Only 30% of your total calories should come from fat. Less than 7% of your diet should come from saturated fat. A diet low in fat and saturated fat is the same as a diet to decrease blood cholesterol. By eating a diet lower in fat, you may lose weight, lower your blood cholesterol, and lower your blood triglyceride level.  Eating a diet low in fat, especially saturated fat, may also help you lower your blood triglyceride level. Ask your dietitian to help you figure how much fat you can eat based on the number of calories your caregiver has prescribed for you.  Exercise, in addition to helping with weight loss may also help lower triglyceride levels.   Alcohol can increase blood triglycerides. You may need to stop drinking alcoholic beverages.  Too much carbohydrate in your diet may also increase your blood triglycerides. Some complex carbohydrates are necessary in your diet. These may include bread, rice, potatoes, other starchy vegetables and cereals.  Reduce "simple" carbohydrates. These may include pure sugars, candy, honey, and jelly without losing other nutrients. If you have the kind of high blood triglycerides that is affected by the amount of carbohydrates in your diet, you will need to eat less sugar and less high-sugar foods. Your caregiver can help you with this.  Adding 2-4 grams of fish oil (EPA+ DHA) may also help lower triglycerides. Speak with your caregiver before adding any supplements to your regimen. Following the Diet  Maintain your ideal weight. Your caregivers can help you with a diet. Generally, eating less food and getting more   exercise will help you lose weight. Joining a weight control group may also help. Ask your caregivers for a good weight control group in your area.  Eat low-fat foods instead of high-fat foods. This can help you lose weight too.  These foods are lower in fat. Eat MORE of these:    Dried beans, peas, and lentils.  Egg whites.  Low-fat cottage cheese.  Fish.  Lean cuts of meat, such as round, sirloin, rump, and flank (cut extra fat off meat you fix).  Whole grain breads, cereals and pasta.  Skim and nonfat dry milk.  Low-fat yogurt.  Poultry without the skin.  Cheese made with skim or part-skim milk, such as mozzarella, parmesan, farmers', ricotta, or pot cheese. These are higher fat foods. Eat LESS of these:   Whole milk and foods made from whole milk, such as American, blue, cheddar, monterey jack, and swiss cheese  High-fat meats, such as luncheon meats, sausages, knockwurst, bratwurst, hot dogs, ribs, corned beef, ground pork, and regular ground beef.  Fried foods. Limit saturated fats in your diet. Substituting unsaturated fat for saturated fat may decrease your blood triglyceride level. You will need to read package labels to know which products contain saturated fats.  These foods are high in saturated fat. Eat LESS of these:   Fried pork skins.  Whole milk.  Skin and fat from poultry.  Palm oil.  Butter.  Shortening.  Cream cheese.  Bacon.  Margarines and baked goods made from listed oils.  Vegetable shortenings.  Chitterlings.  Fat from meats.  Coconut oil.  Palm kernel oil.  Lard.  Cream.  Sour cream.  Fatback.  Coffee whiteners and non-dairy creamers made with these oils.  Cheese made from whole milk. Use unsaturated fats (both polyunsaturated and monounsaturated) moderately. Remember, even though unsaturated fats are better than saturated fats; you still want a diet low in total fat.  These foods are high in unsaturated fat:   Canola oil.  Sunflower oil.  Mayonnaise.  Almonds.  Peanuts.  Pine nuts.  Margarines made with these oils.  Safflower oil.  Olive oil.  Avocados.  Cashews.  Peanut butter.  Sunflower seeds.  Soybean oil.  Peanut  oil.  Olives.  Pecans.  Walnuts.  Pumpkin seeds. Avoid sugar and other high-sugar foods. This will decrease carbohydrates without decreasing other nutrients. Sugar in your food goes rapidly to your blood. When there is excess sugar in your blood, your liver may use it to make more triglycerides. Sugar also contains calories without other important nutrients.  Eat LESS of these:   Sugar, brown sugar, powdered sugar, jam, jelly, preserves, honey, syrup, molasses, pies, candy, cakes, cookies, frosting, pastries, colas, soft drinks, punches, fruit drinks, and regular gelatin.  Avoid alcohol. Alcohol, even more than sugar, may increase blood triglycerides. In addition, alcohol is high in calories and low in nutrients. Ask for sparkling water, or a diet soft drink instead of an alcoholic beverage. Suggestions for planning and preparing meals   Bake, broil, grill or roast meats instead of frying.  Remove fat from meats and skin from poultry before cooking.  Add spices, herbs, lemon juice or vinegar to vegetables instead of salt, rich sauces or gravies.  Use a non-stick skillet without fat or use no-stick sprays.  Cool and refrigerate stews and broth. Then remove the hardened fat floating on the surface before serving.  Refrigerate meat drippings and skim off fat to make low-fat gravies.  Serve more fish.  Use less butter,   margarine and other high-fat spreads on bread or vegetables.  Use skim or reconstituted non-fat dry milk for cooking.  Cook with low-fat cheeses.  Substitute low-fat yogurt or cottage cheese for all or part of the sour cream in recipes for sauces, dips or congealed salads.  Use half yogurt/half mayonnaise in salad recipes.  Substitute evaporated skim milk for cream. Evaporated skim milk or reconstituted non-fat dry milk can be whipped and substituted for whipped cream in certain recipes.  Choose fresh fruits for dessert instead of high-fat foods such as pies or  cakes. Fruits are naturally low in fat. When Dining Out   Order low-fat appetizers such as fruit or vegetable juice, pasta with vegetables or tomato sauce.  Select clear, rather than cream soups.  Ask that dressings and gravies be served on the side. Then use less of them.  Order foods that are baked, broiled, poached, steamed, stir-fried, or roasted.  Ask for margarine instead of butter, and use only a small amount.  Drink sparkling water, unsweetened tea or coffee, or diet soft drinks instead of alcohol or other sweet beverages. QUESTIONS AND ANSWERS ABOUT OTHER FATS IN THE BLOOD: SATURATED FAT, TRANS FAT, AND CHOLESTEROL What is trans fat? Trans fat is a type of fat that is formed when vegetable oil is hardened through a process called hydrogenation. This process helps makes foods more solid, gives them shape, and prolongs their shelf life. Trans fats are also called hydrogenated or partially hydrogenated oils.  What do saturated fat, trans fat, and cholesterol in foods have to do with heart disease? Saturated fat, trans fat, and cholesterol in the diet all raise the level of LDL "bad" cholesterol in the blood. The higher the LDL cholesterol, the greater the risk for coronary heart disease (CHD). Saturated fat and trans fat raise LDL similarly.  What foods contain saturated fat, trans fat, and cholesterol? High amounts of saturated fat are found in animal products, such as fatty cuts of meat, chicken skin, and full-fat dairy products like butter, whole milk, cream, and cheese, and in tropical vegetable oils such as palm, palm kernel, and coconut oil. Trans fat is found in some of the same foods as saturated fat, such as vegetable shortening, some margarines (especially hard or stick margarine), crackers, cookies, baked goods, fried foods, salad dressings, and other processed foods made with partially hydrogenated vegetable oils. Small amounts of trans fat also occur naturally in some animal  products, such as milk products, beef, and lamb. Foods high in cholesterol include liver, other organ meats, egg yolks, shrimp, and full-fat dairy products. How can I use the new food label to make heart-healthy food choices? Check the Nutrition Facts panel of the food label. Choose foods lower in saturated fat, trans fat, and cholesterol. For saturated fat and cholesterol, you can also use the Percent Daily Value (%DV): 5% DV or less is low, and 20% DV or more is high. (There is no %DV for trans fat.) Use the Nutrition Facts panel to choose foods low in saturated fat and cholesterol, and if the trans fat is not listed, read the ingredients and limit products that list shortening or hydrogenated or partially hydrogenated vegetable oil, which tend to be high in trans fat. POINTS TO REMEMBER:   Discuss your risk for heart disease with your caregivers, and take steps to reduce risk factors.  Change your diet. Choose foods that are low in saturated fat, trans fat, and cholesterol.  Add exercise to your daily routine if   it is not already being done. Participate in physical activity of moderate intensity, like brisk walking, for at least 30 minutes on most, and preferably all days of the week. No time? Break the 30 minutes into three, 10-minute segments during the day.  Stop smoking. If you do smoke, contact your caregiver to discuss ways in which they can help you quit.  Do not use street drugs.  Maintain a normal weight.  Maintain a healthy blood pressure.  Keep up with your blood work for checking the fats in your blood as directed by your caregiver. Document Released: 10/02/2004 Document Revised: 06/15/2012 Document Reviewed: 04/30/2009 [employer] Patient Information 2014 [employer].  

## 2013-11-04 LAB — CMP14+EGFR
ALT: 26 IU/L (ref 0–44)
AST: 22 IU/L (ref 0–40)
Albumin: 4.1 g/dL (ref 3.5–4.8)
Alkaline Phosphatase: 99 IU/L (ref 39–117)
BUN/Creatinine Ratio: 25 — ABNORMAL HIGH (ref 10–22)
Chloride: 100 mmol/L (ref 97–108)
GFR calc Af Amer: 85 mL/min/{1.73_m2} (ref >59)
Potassium: 4.6 mmol/L (ref 3.5–5.2)
Sodium: 139 mmol/L (ref 134–144)
Total Bilirubin: 1.3 mg/dL — ABNORMAL HIGH (ref 0.0–1.2)

## 2013-11-04 LAB — NMR, LIPOPROFILE
Cholesterol: 118 mg/dL (ref <200)
HDL Cholesterol by NMR: 40 mg/dL (ref >=40)
LDL Particle Number: 1082 nmol/L — ABNORMAL HIGH (ref <1000)
LDL Size: 20.1 nm — ABNORMAL LOW (ref >20.5)
Triglycerides by NMR: 97 mg/dL (ref <150)

## 2013-12-29 DIAGNOSIS — N200 Calculus of kidney: Secondary | ICD-10-CM

## 2013-12-29 HISTORY — DX: Calculus of kidney: N20.0

## 2014-01-03 ENCOUNTER — Ambulatory Visit (INDEPENDENT_AMBULATORY_CARE_PROVIDER_SITE_OTHER): Payer: Medicare Other | Admitting: General Practice

## 2014-01-03 ENCOUNTER — Encounter (INDEPENDENT_AMBULATORY_CARE_PROVIDER_SITE_OTHER): Payer: Self-pay

## 2014-01-03 ENCOUNTER — Telehealth: Payer: Self-pay | Admitting: General Practice

## 2014-01-03 VITALS — BP 126/79 | HR 77 | Temp 98.4°F

## 2014-01-03 DIAGNOSIS — R05 Cough: Secondary | ICD-10-CM

## 2014-01-03 DIAGNOSIS — J0100 Acute maxillary sinusitis, unspecified: Secondary | ICD-10-CM

## 2014-01-03 DIAGNOSIS — R059 Cough, unspecified: Secondary | ICD-10-CM

## 2014-01-03 MED ORDER — AZITHROMYCIN 250 MG PO TABS: ORAL_TABLET | ORAL | Status: DC

## 2014-01-03 MED ORDER — GUAIFENESIN-CODEINE 100-10 MG/5ML PO SYRP: 5.0000 mL | ORAL_SOLUTION | Freq: Three times a day (TID) | ORAL | Status: DC | PRN

## 2014-01-03 NOTE — Telephone Encounter (Signed)
appt today at 3

## 2014-01-03 NOTE — Patient Instructions (Addendum)

## 2014-01-03 NOTE — Progress Notes (Signed)
   Subjective:    Patient ID: Vincent EaringJames D Kastens, male    DOB: 03/13/1941, 73 y.o.   MRN: [id number]  Sinusitis This is a new problem. The current episode started in the past 7 days. The problem has been gradually worsening since onset. There has been no fever. Associated symptoms include coughing, sinus pressure and sneezing. Pertinent negatives include no chills or sore throat. Past treatments include oral decongestants. The treatment provided mild relief.  Cough This is a new problem. The current episode started in the past 7 days. The problem has been unchanged. The cough is productive of sputum. Pertinent negatives include no chest pain, chills, postnasal drip or sore throat. The symptoms are aggravated by lying down. He has tried OTC cough suppressant for the symptoms. His past medical history is significant for COPD. There is no history of asthma or pneumonia.      Review of Systems  Constitutional: Negative for chills.  HENT: Positive for sinus pressure and sneezing. Negative for postnasal drip and sore throat.   Respiratory: Positive for cough. Negative for chest tightness.   Cardiovascular: Negative for chest pain and palpitations.  All other systems reviewed and are negative.       Objective:   Physical Exam  Constitutional: He is oriented to person, place, and time. He appears well-developed and well-nourished.  HENT:  Head: Normocephalic and atraumatic.  Right Ear: External ear normal.  Left Ear: External ear normal.  Nose: Right sinus exhibits maxillary sinus tenderness. Left sinus exhibits maxillary sinus tenderness.  Mouth/Throat: Posterior oropharyngeal erythema present.  Cardiovascular: Normal rate, regular rhythm and normal heart sounds.   Pulmonary/Chest: Effort normal and breath sounds normal. No respiratory distress. He exhibits no tenderness.  Neurological: He is alert and oriented to person, place, and time.  Skin: Skin is warm and dry.  Psychiatric: He has a  normal mood and affect.           Assessment & Plan:  1. Sinusitis, acute maxillary  - azithromycin (ZITHROMAX) 250 MG tablet; Take as directed  Dispense: 6 tablet; Refill: 0  2. Cough  - guaiFENesin-codeine (CHERATUSSIN AC) 100-10 MG/5ML syrup; Take 5 mLs by mouth 3 (three) times daily as needed for cough.  Dispense: 120 mL; Refill: 0 -avoid irritants -RTO if symptoms worsen or unresolved -will have chest xray and possible referral if symptoms persists Patient verbalized understanding Coralie KeensMae E. Kindell Strada, FNP-C

## 2014-02-08 ENCOUNTER — Other Ambulatory Visit: Payer: Self-pay | Admitting: General Practice

## 2014-02-08 ENCOUNTER — Other Ambulatory Visit: Payer: Self-pay | Admitting: Family Medicine

## 2014-05-16 ENCOUNTER — Other Ambulatory Visit: Payer: Self-pay | Admitting: General Practice

## 2014-05-17 ENCOUNTER — Encounter (INDEPENDENT_AMBULATORY_CARE_PROVIDER_SITE_OTHER): Payer: Self-pay

## 2014-05-17 ENCOUNTER — Encounter: Payer: Self-pay | Admitting: Family

## 2014-05-17 ENCOUNTER — Ambulatory Visit (INDEPENDENT_AMBULATORY_CARE_PROVIDER_SITE_OTHER): Payer: Medicare Other | Admitting: Family

## 2014-05-17 ENCOUNTER — Ambulatory Visit: Payer: Medicare Other | Admitting: Family

## 2014-05-17 VITALS — BP 135/71 | HR 77 | Temp 97.6°F | Ht 68.0 in | Wt 257.0 lb

## 2014-05-17 DIAGNOSIS — E785 Hyperlipidemia, unspecified: Secondary | ICD-10-CM

## 2014-05-17 DIAGNOSIS — E559 Vitamin D deficiency, unspecified: Secondary | ICD-10-CM

## 2014-05-17 DIAGNOSIS — Z125 Encounter for screening for malignant neoplasm of prostate: Secondary | ICD-10-CM

## 2014-05-17 DIAGNOSIS — I10 Essential (primary) hypertension: Secondary | ICD-10-CM

## 2014-05-17 DIAGNOSIS — Z2911 Encounter for prophylactic immunotherapy for respiratory syncytial virus (RSV): Secondary | ICD-10-CM

## 2014-05-17 DIAGNOSIS — J449 Chronic obstructive pulmonary disease, unspecified: Secondary | ICD-10-CM

## 2014-05-17 DIAGNOSIS — R7989 Other specified abnormal findings of blood chemistry: Secondary | ICD-10-CM

## 2014-05-17 DIAGNOSIS — Z23 Encounter for immunization: Secondary | ICD-10-CM

## 2014-05-17 DIAGNOSIS — R7309 Other abnormal glucose: Secondary | ICD-10-CM

## 2014-05-17 NOTE — Progress Notes (Signed)
   Subjective:    Patient ID: Vincent Collins, male    DOB: 07-22-1941, 73 y.o.   MRN: [id number]  Hypertension This is a chronic problem. The current episode started more than 1 year ago. The problem has been resolved since onset. The problem is controlled. Pertinent negatives include no anxiety, blurred vision, chest pain, palpitations, peripheral edema or shortness of breath. Risk factors for coronary artery disease include diabetes mellitus, dyslipidemia, family history, male gender and obesity. Past treatments include ACE inhibitors and calcium channel blockers. The current treatment provides significant improvement. Compliance problems include exercise.  There is no history of kidney disease. There is no history of chronic renal disease.  Hyperlipidemia This is a chronic problem. The current episode started more than 1 year ago. The problem is controlled. Recent lipid tests were reviewed and are normal. Exacerbating diseases include obesity. He has no history of chronic renal disease or diabetes. Pertinent negatives include no chest pain or shortness of breath. Current antihyperlipidemic treatment includes statins. The current treatment provides moderate improvement of lipids. Risk factors for coronary artery disease include dyslipidemia, family history, male sex and obesity.  COPD Pt states he feels his breathing is good. Pt states he has not had to use "rescue inhaler" in over a month.     Review of Systems  HENT: Negative.   Eyes: Negative for blurred vision.  Respiratory: Negative.  Negative for shortness of breath.   Cardiovascular: Negative for chest pain and palpitations.  Genitourinary: Negative.   Psychiatric/Behavioral: Negative.   All other systems reviewed and are negative.      Objective:   Physical Exam  Vitals reviewed. Constitutional: He is oriented to person, place, and time. He appears well-developed and well-nourished. No distress.  HENT:  Head: Normocephalic.    Right Ear: External ear normal.  Left Ear: External ear normal.  Nose: Nose normal.  Mouth/Throat: Oropharynx is clear and moist.  Eyes: Pupils are equal, round, and reactive to light. Right eye exhibits no discharge. Left eye exhibits no discharge.  Neck: Normal range of motion. Neck supple. No thyromegaly present.  Cardiovascular: Normal rate, regular rhythm, normal heart sounds and intact distal pulses.   No murmur heard. Pulmonary/Chest: Effort normal and breath sounds normal. No respiratory distress. He has no wheezes.  Abdominal: Soft. Bowel sounds are normal. He exhibits no distension. There is tenderness ("small hernia around umbilicus area").  Musculoskeletal: Normal range of motion. He exhibits no edema and no tenderness.  Neurological: He is alert and oriented to person, place, and time.  Skin: Skin is warm and dry. No rash noted. No erythema.  Psychiatric: He has a normal mood and affect. His behavior is normal. Judgment and thought content normal.     BP 135/71  Pulse 77  Temp(Src) 97.6 F (36.4 C) (Oral)  Ht $R'5\' 8"'PM$  (1.727 m)  Wt 257 lb (116.574 kg)  BMI 39.09 kg/m2      Assessment & Plan:  1. HTN (hypertension) - CMP14+EGFR  2. COPD (chronic obstructive pulmonary disease)  3. Hyperlipemia - Lipid panel  4. Unspecified vitamin D deficiency - Vit D  25 hydroxy (rtn osteoporosis monitoring)  5. Prostate cancer screening - PSA, total and free   Continue all meds Labs pending Health Maintenance reviewed Hemoccult cards given to patient with directions Diet and exercise encouraged RTO 3 months   Evelina Dun, FNP

## 2014-05-17 NOTE — Patient Instructions (Signed)

## 2014-05-18 LAB — CMP14+EGFR
ALBUMIN: 4.6 g/dL (ref 3.5–4.8)
ALK PHOS: 106 IU/L (ref 39–117)
ALT: 20 IU/L (ref 0–44)
AST: 20 IU/L (ref 0–40)
Albumin/Globulin Ratio: 2.7 — ABNORMAL HIGH (ref 1.1–2.5)
BUN/Creatinine Ratio: 18 (ref 10–22)
BUN: 16 mg/dL (ref 8–27)
CHLORIDE: 104 mmol/L (ref 97–108)
CO2: 23 mmol/L (ref 18–29)
Calcium: 9.1 mg/dL (ref 8.6–10.2)
Creatinine, Ser: 0.88 mg/dL (ref 0.76–1.27)
GFR calc Af Amer: 100 mL/min/{1.73_m2} (ref >59)
GFR calc non Af Amer: 86 mL/min/{1.73_m2} (ref >59)
GLUCOSE: 167 mg/dL — AB (ref 65–99)
Globulin, Total: 1.7 g/dL (ref 1.5–4.5)
Potassium: 4.2 mmol/L (ref 3.5–5.2)
Sodium: 142 mmol/L (ref 134–144)
TOTAL PROTEIN: 6.3 g/dL (ref 6.0–8.5)
Total Bilirubin: 1.2 mg/dL (ref 0.0–1.2)

## 2014-05-18 LAB — LIPID PANEL
CHOLESTEROL TOTAL: 109 mg/dL (ref 100–199)
Chol/HDL Ratio: 2.7 ratio units (ref 0.0–5.0)
HDL: 40 mg/dL (ref >39)
LDL CALC: 49 mg/dL (ref 0–99)
Triglycerides: 99 mg/dL (ref 0–149)
VLDL CHOLESTEROL CAL: 20 mg/dL (ref 5–40)

## 2014-05-18 LAB — PSA, TOTAL AND FREE
PSA, Free Pct: 11.3 %
PSA, Free: 0.26 ng/mL
PSA: 2.3 ng/mL (ref 0.0–4.0)

## 2014-05-18 LAB — VITAMIN D 25 HYDROXY (VIT D DEFICIENCY, FRACTURES): Vit D, 25-Hydroxy: 37.2 ng/mL (ref 30.0–100.0)

## 2014-05-19 ENCOUNTER — Other Ambulatory Visit: Payer: Self-pay | Admitting: Family

## 2014-05-19 ENCOUNTER — Telehealth: Payer: Self-pay | Admitting: *Deleted

## 2014-05-19 LAB — POCT GLYCOSYLATED HEMOGLOBIN (HGB A1C): Hemoglobin A1C: 7.5

## 2014-05-19 MED ORDER — METFORMIN HCL 500 MG PO TABS: 500.0000 mg | ORAL_TABLET | Freq: Two times a day (BID) | ORAL | Status: DC

## 2014-05-19 NOTE — Addendum Note (Signed)
Addended by: Prescott Gum on: 05/19/2014 12:20 PM   Modules accepted: Orders

## 2014-05-19 NOTE — Telephone Encounter (Signed)
Need to discuss diabetes and medications.  Please call.

## 2014-05-23 NOTE — Telephone Encounter (Signed)
Discussed lab results with patient and new medication.

## 2014-06-08 ENCOUNTER — Encounter: Payer: Self-pay | Admitting: Pharmacist

## 2014-06-08 ENCOUNTER — Ambulatory Visit (INDEPENDENT_AMBULATORY_CARE_PROVIDER_SITE_OTHER): Payer: Medicare Other | Admitting: Pharmacist

## 2014-06-08 VITALS — BP 128/78 | HR 74 | Ht 68.0 in | Wt 255.0 lb

## 2014-06-08 DIAGNOSIS — E119 Type 2 diabetes mellitus without complications: Secondary | ICD-10-CM

## 2014-06-08 NOTE — Patient Instructions (Addendum)
Diabetes and Standards of Medical Care  Diabetes is complicated. You may find that your diabetes team includes a dietitian, nurse, diabetes educator, eye doctor, and more. To help everyone know what is going on and to help you get the care you deserve, the following schedule of care  was developed to help keep you on track. Below are the tests, exams, vaccines, medicines, education, and plans you will need.   Blood glucose goal -  Prior to meals = 70 to 130  Within 2 hours of the start of a meal = less than 180  HbA1c test - goal is less than 7.0% (your value was 7.5%) This test shows how well you have controlled your glucose over the past 2 3 months. It is used to see if your diabetes management plan needs to be adjusted.   It is performed at least 2 times a year if you are meeting treatment goals.  It is performed 4 times a year if therapy has changed or if you are not meeting treatment goals. Blood pressure test (goal is less than 140/90)  This test is performed at every routine medical visit. The goal is less than 140/90 mmHg for most people, but 130/80 mmHg in some cases. Ask your health care provider about your goal. Dental exam  Follow up with the dentist regularly. Eye exam  If you are diagnosed with type 1 diabetes as a child, get an exam upon reaching the age of 12 years or older and have had diabetes for 3 5 years. Yearly eye exams are recommended after that initial eye exam.  If you are diagnosed with type 1 diabetes as an adult, get an exam within 5 years of diagnosis and then yearly.  If you are diagnosed with type 2 diabetes, get an exam as soon as possible after the diagnosis and then yearly. Foot care exam  Visual foot exams are performed at every routine medical visit. The exams check for cuts, injuries, or other problems with the feet.  A comprehensive foot exam should be done yearly. This includes visual inspection as well as assessing foot pulses and testing for  loss of sensation.  Check your feet nightly for cuts, injuries, or other problems with your feet. Tell your health care provider if anything is not healing. Kidney function test (urine microalbumin)  This test is performed once a year.  Type 1 diabetes: The first test is performed 5 years after diagnosis.  Type 2 diabetes: The first test is performed at the time of diagnosis.  A serum creatinine and estimated glomerular filtration rate (eGFR) test is done once a year to assess the level of chronic kidney disease (CKD), if present. Lipid profile (cholesterol, HDL, LDL, triglycerides)  Performed every 5 years for most people.  The goal for LDL is less than 100 mg/dL. If you are at high risk, the goal is less than 70 mg/dL.  The goal for HDL is 40 mg/dL or above for men and 50 mg/dL 60 mg/dL for women. An HDL cholesterol of 60 mg/dL or higher gives some protection against heart disease.  The goal for triglycerides is less than 150 mg/dL. Influenza vaccine, pneumococcal vaccine, and hepatitis B vaccine  The influenza vaccine is recommended yearly.  The pneumococcal vaccine is generally given once in a lifetime. However, there are some instances when another vaccination is recommended. Check with your health care provider.  The hepatitis B vaccine is also recommended for adults with diabetes. Diabetes self-management education  Education is recommended at diagnosis and ongoing as needed. Treatment plan  Your treatment plan is reviewed at every medical visit. Document Released: 10/12/2009 Document Revised: 08/17/2013 Document Reviewed: 05/17/2013 [facility] Patient Information 2014 [employer].

## 2014-06-08 NOTE — Progress Notes (Signed)
Diabetes Follow-Up Visit Chief Complaint:   Chief Complaint  Patient presents with  . Diabetes     Filed Vitals:   06/08/14 1447  BP: 128/78  Pulse: 74    HPI: Patient is referred for diabetes education.  He was initially diagnosed with type 2 DM 1 year ago.  He was treating with therapeutic lifestyle changes only until about 2 weeks ago when A1c was elevated at 7.5%.  At that time metformin 500mg  bid was started.  Patient complains that at times he feels lightheaded since starting metformin.    Home BG Monitoring:  Checking 1 times a day. Average:  120  High: 124  Low:  108  Low fat/carbohydrate diet?  No Nicotine Abuse?  No Medication Compliance?  Yes Exercise?  No Alcohol Abuse?  No   Exam Edema:  negative  Polyuria:  negative  Polydipsia:  negative Polyphagia:  negative  BMI:  Body mass index is 38.78 kg/(m^2).   Weight changes:  Decreased 2# over last 2 weeks General Appearance:  alert, oriented, no acute distress and obese Mood/Affect:  normal   Lab Results  Component Value Date   HGBA1C 7.5 05/19/2014    No results found for this basenameConcepcion Collins    Lab Results  Component Value Date   CHOL 118 11/02/2013   HDL 40 05/17/2014   LDLCALC 49 05/17/2014   TRIG 99 05/17/2014   CHOLHDL 2.7 05/17/2014      Assessment: 1.  Diabetes.  uncontrolled 2.  Blood Pressure.  At goal 3.  Lipids.  At goal   Recommendations: 1.  Medication recommendations at this time are as follows:    Discontinue niacin - might lead to elevated BG and patient states that about twice a week he is waking during night with flushing  Change metformin to 500mg  qd with largest meal  2.  Reviewed HBG goals:  Fasting 80-130 and 1-2 hour post prandial <180.  Patient is instructed to check BG 1 times per day.    3.  BP goal < 140/85. 4.  LDL goal of < 100, HDL > 40 and TG < 150. 5.  Eye Exam yearly and Dental Exam every 6 months. 6.  Dietary recommendations:  Discussed CHO serving  sizes and taught principle of CHO exchanging to better control BG. Also recommended Increase non-starchy vegetables, limit sugar and processed foods (cakes, cookies, ice cream, crackers and chips) Increase fresh fruit but limit serving sizes 1/2 cup or about the size of tennis or baseball, limit red meat and choose whole grains / leans proteins - whole wheat bread, quinoa, whole grain rice (1/2 cup), fish, chicken, [place]  7.  Physical Activity recommendations:  Increase as able - goal per week 8.  Discussed pathophysiology of DM and ABC's of controlling DM and preventing long term complications   9.  Return to clinic in 4-6 wks   Time spent counseling patient:  60 minutes  Referring provider:  Jannifer Rodney, NP  Henrene Pastor, PharmD, CPP

## 2014-06-28 ENCOUNTER — Other Ambulatory Visit: Payer: Self-pay | Admitting: *Deleted

## 2014-06-28 MED ORDER — GLUCOSE BLOOD VI STRP: ORAL_STRIP | Status: DC

## 2014-06-28 MED ORDER — VALUE PLUS LANCETS THIN 26G MISC: Status: DC

## 2014-06-29 ENCOUNTER — Other Ambulatory Visit: Payer: Self-pay

## 2014-06-29 MED ORDER — GLUCOSE BLOOD VI STRP: 1.0000 | ORAL_STRIP | Freq: Every day | Status: DC

## 2014-06-29 MED ORDER — ACCU-CHEK AVIVA PLUS W/DEVICE KIT: 1.0000 | PACK | Freq: Every day | Status: DC

## 2014-06-29 MED ORDER — ACCU-CHEK MULTICLIX LANCETS MISC: Status: DC

## 2014-07-27 ENCOUNTER — Other Ambulatory Visit: Payer: Self-pay | Admitting: Family Medicine

## 2014-08-15 ENCOUNTER — Ambulatory Visit (INDEPENDENT_AMBULATORY_CARE_PROVIDER_SITE_OTHER): Payer: Medicare Other | Admitting: Family

## 2014-08-24 ENCOUNTER — Other Ambulatory Visit: Payer: Self-pay | Admitting: Family

## 2014-08-25 ENCOUNTER — Ambulatory Visit (INDEPENDENT_AMBULATORY_CARE_PROVIDER_SITE_OTHER): Payer: Medicare Other | Admitting: Family

## 2014-08-25 ENCOUNTER — Encounter: Payer: Self-pay | Admitting: Family

## 2014-08-25 VITALS — BP 139/79 | HR 76 | Temp 97.6°F | Ht 68.0 in | Wt 253.8 lb

## 2014-08-25 DIAGNOSIS — I10 Essential (primary) hypertension: Secondary | ICD-10-CM

## 2014-08-25 DIAGNOSIS — E1165 Type 2 diabetes mellitus with hyperglycemia: Secondary | ICD-10-CM

## 2014-08-25 DIAGNOSIS — J4489 Other specified chronic obstructive pulmonary disease: Secondary | ICD-10-CM

## 2014-08-25 DIAGNOSIS — E559 Vitamin D deficiency, unspecified: Secondary | ICD-10-CM

## 2014-08-25 DIAGNOSIS — E119 Type 2 diabetes mellitus without complications: Secondary | ICD-10-CM

## 2014-08-25 DIAGNOSIS — J449 Chronic obstructive pulmonary disease, unspecified: Secondary | ICD-10-CM

## 2014-08-25 DIAGNOSIS — E785 Hyperlipidemia, unspecified: Secondary | ICD-10-CM

## 2014-08-25 LAB — POCT GLYCOSYLATED HEMOGLOBIN (HGB A1C): Hemoglobin A1C: 6.6

## 2014-08-25 LAB — POCT UA - MICROALBUMIN: MICROALBUMIN (UR) POC: NEGATIVE mg/L

## 2014-08-25 NOTE — Patient Instructions (Signed)

## 2014-08-25 NOTE — Progress Notes (Signed)
Subjective:    Patient ID: Vincent Collins, male    DOB: 16-Sep-1941, 73 y.o.   MRN: [id number]  Diabetes He presents for his follow-up diabetic visit. He has type 2 diabetes mellitus. His disease course has been fluctuating. Pertinent negatives for hypoglycemia include no confusion or dizziness. Pertinent negatives for diabetes include no blurred vision, no chest pain, no foot ulcerations and no visual change. Pertinent negatives for diabetic complications include no CVA, heart disease, nephropathy or peripheral neuropathy. Risk factors for coronary artery disease include diabetes mellitus, dyslipidemia, hypertension, male sex, obesity and family history. Current diabetic treatment includes oral agent (monotherapy). He is following a generally healthy diet. His breakfast blood glucose range is generally 110-130 mg/dl. An ACE inhibitor/angiotensin II receptor blocker is being taken. Eye exam is current.  Hypertension This is a chronic problem. The current episode started more than 1 year ago. The problem has been resolved since onset. The problem is controlled. Pertinent negatives include no anxiety, blurred vision, chest pain, palpitations, peripheral edema or shortness of breath. Risk factors for coronary artery disease include diabetes mellitus, dyslipidemia, family history, male gender and obesity. Past treatments include ACE inhibitors and calcium channel blockers. The current treatment provides significant improvement. Compliance problems include exercise.  There is no history of kidney disease, CVA, heart failure or a thyroid problem. There is no history of chronic renal disease.  Hyperlipidemia This is a chronic problem. The current episode started more than 1 year ago. The problem is controlled. Recent lipid tests were reviewed and are normal. Exacerbating diseases include obesity. He has no history of chronic renal disease or diabetes. Pertinent negatives include no chest pain or shortness of  breath. Current antihyperlipidemic treatment includes statins. The current treatment provides moderate improvement of lipids. Risk factors for coronary artery disease include dyslipidemia, family history, male sex and obesity.      Review of Systems  Constitutional: Negative.   HENT: Negative.   Eyes: Negative for blurred vision.  Respiratory: Negative.  Negative for shortness of breath.   Cardiovascular: Negative.  Negative for chest pain and palpitations.  Gastrointestinal: Negative.   Endocrine: Negative.   Genitourinary: Negative.   Musculoskeletal: Negative.   Neurological: Negative.  Negative for dizziness.  Hematological: Negative.   Psychiatric/Behavioral: Negative.  Negative for confusion.  All other systems reviewed and are negative.      Objective:   Physical Exam  Vitals reviewed. Constitutional: He is oriented to person, place, and time. He appears well-developed and well-nourished. No distress.  HENT:  Head: Normocephalic.  Right Ear: External ear normal.  Left Ear: External ear normal.  Mouth/Throat: Oropharynx is clear and moist.  Eyes: Pupils are equal, round, and reactive to light. Right eye exhibits no discharge. Left eye exhibits no discharge.  Neck: Normal range of motion. Neck supple. No thyromegaly present.  Cardiovascular: Normal rate, regular rhythm, normal heart sounds and intact distal pulses.   No murmur heard. Pulmonary/Chest: Effort normal and breath sounds normal. No respiratory distress. He has no wheezes.  Abdominal: Soft. Bowel sounds are normal. He exhibits no distension. There is no tenderness.  Musculoskeletal: Normal range of motion. He exhibits no edema and no tenderness.  Neurological: He is alert and oriented to person, place, and time. He has normal reflexes. No cranial nerve deficit.  Skin: Skin is warm and dry. No rash noted. No erythema.  Psychiatric: He has a normal mood and affect. His behavior is normal. Judgment and thought  content normal.  BP 139/79  Pulse 76  Temp(Src) 97.6 F (36.4 C) (Oral)  Ht $R'5\' 8"'hy$  (1.727 m)  Wt 253 lb 12.8 oz (115.123 kg)  BMI 38.60 kg/m2  *See Diabetic foot note     Assessment & Plan:  1. Essential hypertension - CMP14+EGFR  2. Chronic obstructive pulmonary disease, unspecified COPD, unspecified chronic bronchitis type - CMP14+EGFR  3. Type 2 diabetes mellitus with hyperglycemia - POCT glycosylated hemoglobin (Hb A1C) - CMP14+EGFR - POCT UA - Microalbumin  4. Hyperlipemia - CMP14+EGFR - Lipid panel  5. Unspecified vitamin D deficiency - CMP14+EGFR - Vit D  25 hydroxy (rtn osteoporosis monitoring)   Continue all meds Labs pending Health Maintenance reviewed Diet and exercise encouraged RTO 3 months   Evelina Dun, FNP

## 2014-08-26 LAB — CMP14+EGFR
A/G RATIO: 2.2 (ref 1.1–2.5)
ALT: 26 IU/L (ref 0–44)
AST: 22 IU/L (ref 0–40)
Albumin: 4.2 g/dL (ref 3.5–4.8)
Alkaline Phosphatase: 108 IU/L (ref 39–117)
BUN / CREAT RATIO: 19 (ref 10–22)
BUN: 17 mg/dL (ref 8–27)
CO2: 24 mmol/L (ref 18–29)
Calcium: 9 mg/dL (ref 8.6–10.2)
Chloride: 98 mmol/L (ref 97–108)
Creatinine, Ser: 0.9 mg/dL (ref 0.76–1.27)
GFR calc non Af Amer: 85 mL/min/{1.73_m2} (ref >59)
GFR, EST AFRICAN AMERICAN: 98 mL/min/{1.73_m2} (ref >59)
GLUCOSE: 143 mg/dL — AB (ref 65–99)
Globulin, Total: 1.9 g/dL (ref 1.5–4.5)
POTASSIUM: 4.2 mmol/L (ref 3.5–5.2)
SODIUM: 138 mmol/L (ref 134–144)
TOTAL PROTEIN: 6.1 g/dL (ref 6.0–8.5)
Total Bilirubin: 1.1 mg/dL (ref 0.0–1.2)

## 2014-08-26 LAB — LIPID PANEL
Chol/HDL Ratio: 3.2 ratio units (ref 0.0–5.0)
Cholesterol, Total: 110 mg/dL (ref 100–199)
HDL: 34 mg/dL — ABNORMAL LOW (ref >39)
LDL Calculated: 53 mg/dL (ref 0–99)
Triglycerides: 116 mg/dL (ref 0–149)
VLDL CHOLESTEROL CAL: 23 mg/dL (ref 5–40)

## 2014-08-26 LAB — VITAMIN D 25 HYDROXY (VIT D DEFICIENCY, FRACTURES): VIT D 25 HYDROXY: 45.6 ng/mL (ref 30.0–100.0)

## 2014-08-28 ENCOUNTER — Telehealth: Payer: Self-pay | Admitting: Family

## 2014-08-29 ENCOUNTER — Encounter: Payer: Self-pay | Admitting: Family

## 2014-08-31 MED ORDER — BUDESONIDE-FORMOTEROL FUMARATE 80-4.5 MCG/ACT IN AERO: 2.0000 | INHALATION_SPRAY | Freq: Two times a day (BID) | RESPIRATORY_TRACT | Status: DC

## 2014-08-31 NOTE — Telephone Encounter (Signed)
Per pt request RX sent to pharmacy

## 2014-08-31 NOTE — Progress Notes (Signed)
Patient ID: Vincent Collins, male   DOB: Nov 25, 1941, 73 y.o.   MRN: [id number] Open chart in error

## 2014-09-09 ENCOUNTER — Encounter: Payer: Self-pay | Admitting: Nurse Practitioner

## 2014-09-09 ENCOUNTER — Ambulatory Visit (INDEPENDENT_AMBULATORY_CARE_PROVIDER_SITE_OTHER): Payer: Medicare Other | Admitting: Nurse Practitioner

## 2014-09-09 VITALS — BP 159/89 | HR 87 | Temp 98.7°F | Ht 68.0 in | Wt 253.6 lb

## 2014-09-09 DIAGNOSIS — K5732 Diverticulitis of large intestine without perforation or abscess without bleeding: Secondary | ICD-10-CM

## 2014-09-09 MED ORDER — HYDROCODONE-ACETAMINOPHEN 5-325 MG PO TABS: 1.0000 | ORAL_TABLET | Freq: Four times a day (QID) | ORAL | Status: DC | PRN

## 2014-09-09 MED ORDER — METRONIDAZOLE 500 MG PO TABS: 500.0000 mg | ORAL_TABLET | Freq: Two times a day (BID) | ORAL | Status: DC

## 2014-09-09 MED ORDER — CIPROFLOXACIN HCL 500 MG PO TABS: 500.0000 mg | ORAL_TABLET | Freq: Two times a day (BID) | ORAL | Status: DC

## 2014-09-09 NOTE — Progress Notes (Signed)
   Subjective:    Patient ID: Vincent Collins, male    DOB: 09/06/1941, 73 y.o.   MRN: [id number]  HPI Patient in c/o left lower quadrant that started 2 days ago- has history of diverticular disease and pain is similar to past diverticulitis episodes. Says he doesn't really watch his diet  And eats what ever he wants to.  * has had a kidney stone in the past and pain is not the same as he had with that.  Review of Systems  Constitutional: Positive for fever. Negative for chills.  Respiratory: Negative.   Cardiovascular: Negative.   Gastrointestinal: Positive for abdominal pain. Negative for diarrhea and constipation.  Genitourinary: Negative.   Neurological: Negative.   Psychiatric/Behavioral: Negative.   All other systems reviewed and are negative.      Objective:   Physical Exam  Constitutional: He is oriented to person, place, and time. He appears well-developed and well-nourished.  Cardiovascular: Normal rate, regular rhythm and normal heart sounds.   Pulmonary/Chest: Effort normal and breath sounds normal.  Abdominal: Soft. Bowel sounds are normal. There is tenderness (LLQ).  Neurological: He is alert and oriented to person, place, and time.  Skin: Skin is warm and dry.  Psychiatric: He has a normal mood and affect. His behavior is normal. Judgment and thought content normal.   BP 159/89  Pulse 87  Temp(Src) 98.7 F (37.1 C) (Oral)  Ht  (1.727 m)  Wt 253 lb 9.6 oz (115.032 kg)  BMI 38.57 kg/m2        Assessment & Plan:   1. Diverticulitis of colon (without mention of hemorrhage)    Meds ordered this encounter  Medications  . ciprofloxacin (CIPRO) 500 MG tablet    Sig: Take 1 tablet (500 mg total) by mouth 2 (two) times daily.    Dispense:  20 tablet    Refill:  0    Order Specific Question:  Supervising Provider    Answer:  Ernestina Penna [[id number]]  . metroNIDAZOLE (FLAGYL) 500 MG tablet    Sig: Take 1 tablet (500 mg total) by mouth 2 (two) times daily.   Dispense:  20 tablet    Refill:  0    Order Specific Question:  Supervising Provider    Answer:  Ernestina Penna [[id number]]  . HYDROcodone-acetaminophen (NORCO/VICODIN) 5-325 MG per tablet    Sig: Take 1 tablet by mouth every 6 (six) hours as needed for moderate pain.    Dispense:  30 tablet    Refill:  0    Order Specific Question:  Supervising Provider    Answer:  Deborra Medina   Force fluids Watch diet RTO prn  Mary-Margaret Daphine Deutscher, FNP

## 2014-09-09 NOTE — Patient Instructions (Signed)

## 2014-09-10 ENCOUNTER — Emergency Department (HOSPITAL_COMMUNITY): Payer: Medicare Other

## 2014-09-10 ENCOUNTER — Encounter (HOSPITAL_COMMUNITY): Payer: Self-pay | Admitting: Emergency Medicine

## 2014-09-10 ENCOUNTER — Emergency Department (HOSPITAL_COMMUNITY)
Admission: EM | Admit: 2014-09-10 | Discharge: 2014-09-10 | Disposition: A | Discharge status: Home / Self Care | Payer: Medicare Other | Attending: Emergency Medicine | Admitting: Emergency Medicine

## 2014-09-10 DIAGNOSIS — Z87891 Personal history of nicotine dependence: Secondary | ICD-10-CM | POA: Diagnosis not present

## 2014-09-10 DIAGNOSIS — R110 Nausea: Secondary | ICD-10-CM | POA: Diagnosis not present

## 2014-09-10 DIAGNOSIS — E119 Type 2 diabetes mellitus without complications: Secondary | ICD-10-CM | POA: Diagnosis not present

## 2014-09-10 DIAGNOSIS — J449 Chronic obstructive pulmonary disease, unspecified: Secondary | ICD-10-CM | POA: Diagnosis not present

## 2014-09-10 DIAGNOSIS — K5900 Constipation, unspecified: Secondary | ICD-10-CM | POA: Insufficient documentation

## 2014-09-10 DIAGNOSIS — Z7982 Long term (current) use of aspirin: Secondary | ICD-10-CM | POA: Insufficient documentation

## 2014-09-10 DIAGNOSIS — E785 Hyperlipidemia, unspecified: Secondary | ICD-10-CM | POA: Diagnosis not present

## 2014-09-10 DIAGNOSIS — N200 Calculus of kidney: Secondary | ICD-10-CM | POA: Insufficient documentation

## 2014-09-10 DIAGNOSIS — I10 Essential (primary) hypertension: Secondary | ICD-10-CM | POA: Diagnosis not present

## 2014-09-10 DIAGNOSIS — Z79899 Other long term (current) drug therapy: Secondary | ICD-10-CM | POA: Diagnosis not present

## 2014-09-10 DIAGNOSIS — R6883 Chills (without fever): Secondary | ICD-10-CM | POA: Insufficient documentation

## 2014-09-10 DIAGNOSIS — R109 Unspecified abdominal pain: Secondary | ICD-10-CM | POA: Insufficient documentation

## 2014-09-10 DIAGNOSIS — J4489 Other specified chronic obstructive pulmonary disease: Secondary | ICD-10-CM | POA: Insufficient documentation

## 2014-09-10 HISTORY — DX: Type 2 diabetes mellitus without complications: E11.9

## 2014-09-10 HISTORY — DX: Diverticulitis of intestine, part unspecified, without perforation or abscess without bleeding: K57.92

## 2014-09-10 LAB — COMPREHENSIVE METABOLIC PANEL
ALBUMIN: 3.6 g/dL (ref 3.5–5.2)
ALK PHOS: 96 U/L (ref 39–117)
ALT: 17 U/L (ref 0–53)
ANION GAP: 12 (ref 5–15)
AST: 17 U/L (ref 0–37)
BUN: 19 mg/dL (ref 6–23)
CALCIUM: 9.1 mg/dL (ref 8.4–10.5)
CO2: 27 mEq/L (ref 19–32)
Chloride: 96 mEq/L (ref 96–112)
Creatinine, Ser: 1.64 mg/dL — ABNORMAL HIGH (ref 0.50–1.35)
GFR calc non Af Amer: 40 mL/min — ABNORMAL LOW (ref >90)
GFR, EST AFRICAN AMERICAN: 47 mL/min — AB (ref >90)
GLUCOSE: 142 mg/dL — AB (ref 70–99)
POTASSIUM: 4.3 meq/L (ref 3.7–5.3)
SODIUM: 135 meq/L — AB (ref 137–147)
TOTAL PROTEIN: 6.7 g/dL (ref 6.0–8.3)
Total Bilirubin: 0.9 mg/dL (ref 0.3–1.2)

## 2014-09-10 LAB — CBC WITH DIFFERENTIAL/PLATELET
BASOS PCT: 0 % (ref 0–1)
Basophils Absolute: 0 10*3/uL (ref 0.0–0.1)
EOS PCT: 1 % (ref 0–5)
Eosinophils Absolute: 0.1 10*3/uL (ref 0.0–0.7)
HCT: 45 % (ref 39.0–52.0)
Hemoglobin: 16 g/dL (ref 13.0–17.0)
LYMPHS ABS: 1.5 10*3/uL (ref 0.7–4.0)
Lymphocytes Relative: 14 % (ref 12–46)
MCH: 30.7 pg (ref 26.0–34.0)
MCHC: 35.6 g/dL (ref 30.0–36.0)
MCV: 86.4 fL (ref 78.0–100.0)
Monocytes Absolute: 1.4 10*3/uL — ABNORMAL HIGH (ref 0.1–1.0)
Monocytes Relative: 13 % — ABNORMAL HIGH (ref 3–12)
NEUTROS PCT: 72 % (ref 43–77)
Neutro Abs: 7.7 10*3/uL (ref 1.7–7.7)
PLATELETS: 125 10*3/uL — AB (ref 150–400)
RBC: 5.21 MIL/uL (ref 4.22–5.81)
RDW: 12.9 % (ref 11.5–15.5)
WBC: 10.7 10*3/uL — ABNORMAL HIGH (ref 4.0–10.5)

## 2014-09-10 LAB — URINALYSIS, ROUTINE W REFLEX MICROSCOPIC
BILIRUBIN URINE: NEGATIVE
Glucose, UA: NEGATIVE mg/dL
Nitrite: NEGATIVE
PH: 7.5 (ref 5.0–8.0)
Protein, ur: NEGATIVE mg/dL
Specific Gravity, Urine: 1.01 (ref 1.005–1.030)
UROBILINOGEN UA: 0.2 mg/dL (ref 0.0–1.0)

## 2014-09-10 LAB — URINE MICROSCOPIC-ADD ON

## 2014-09-10 IMAGING — CT CT ABD-PELV W/ CM
2 of 5 series · 15 of 46 positions shown, 17 images · IV contrast (omnipaque)
Comparison: Prior CT from [DATE]

CLINICAL DATA: Lower abdominal pain, nausea.

EXAM:
CT ABDOMEN AND PELVIS WITH CONTRAST
TECHNIQUE: Multidetector CT imaging of the abdomen and pelvis was performed
using the standard protocol following bolus administration of
intravenous contrast.
CONTRAST:  80mL OMNIPAQUE IOHEXOL 300 MG/ML SOLN, 50mL OMNIPAQUE
IOHEXOL 300 MG/ML SOLN

[Series 2: abd_pel_with 5.0 b40f · axial · 0.91mm/px · z∈[-475,-45]mm · 12 of 98 slices shown, 14 images]
[im 6/98  soft-tissue]
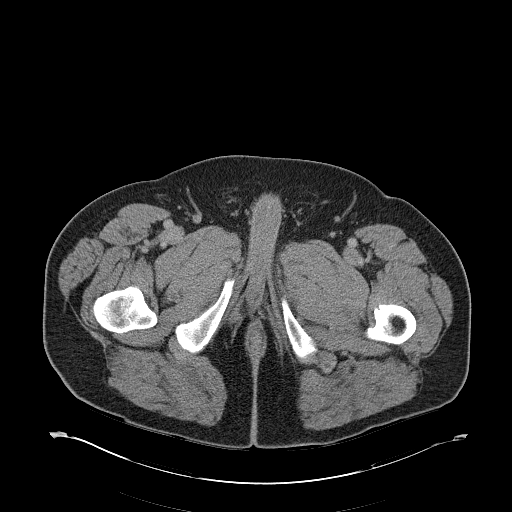
[im 6/98  bone]
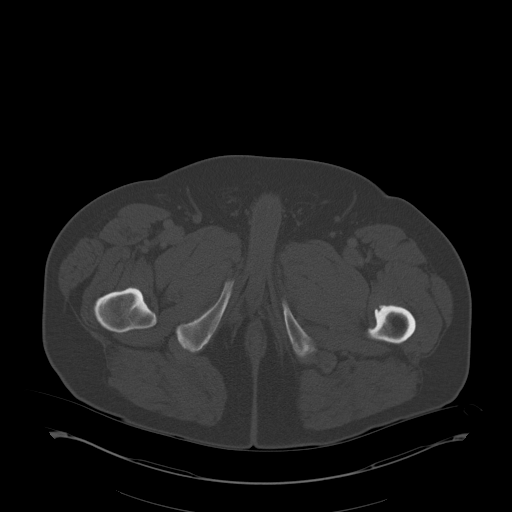
[im 17/98  soft-tissue]
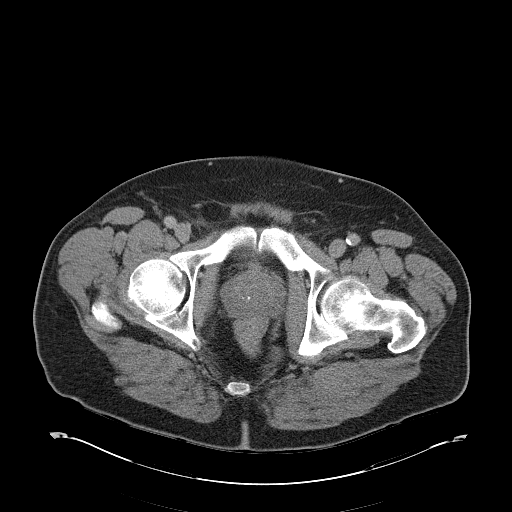
[im 22/98  soft-tissue]
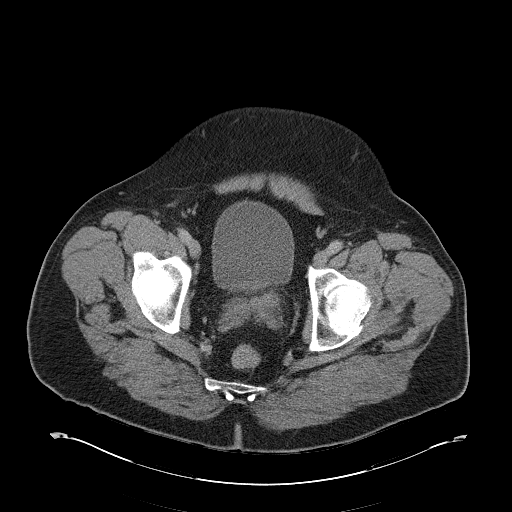
[im 27/98  soft-tissue]
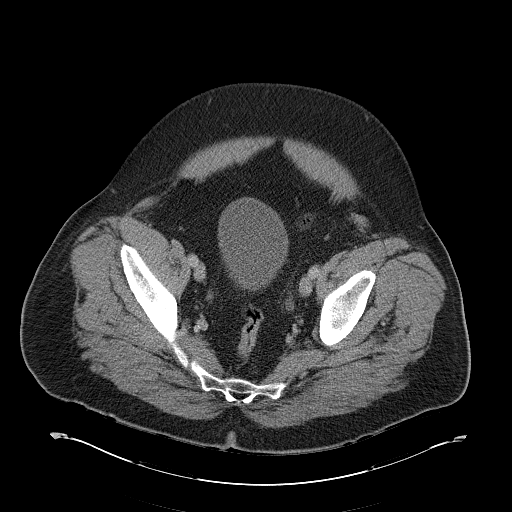
[im 38/98  soft-tissue]
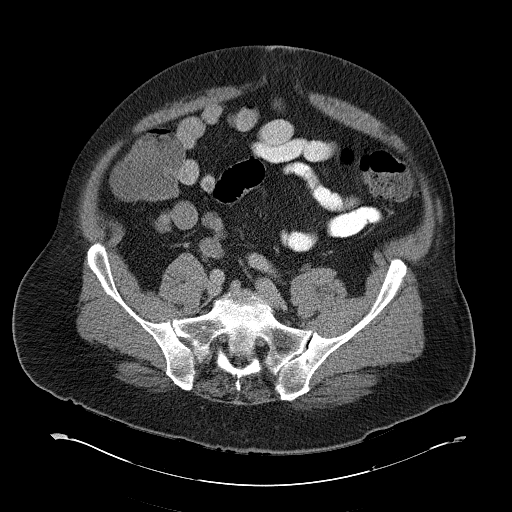
[im 44/98  soft-tissue]
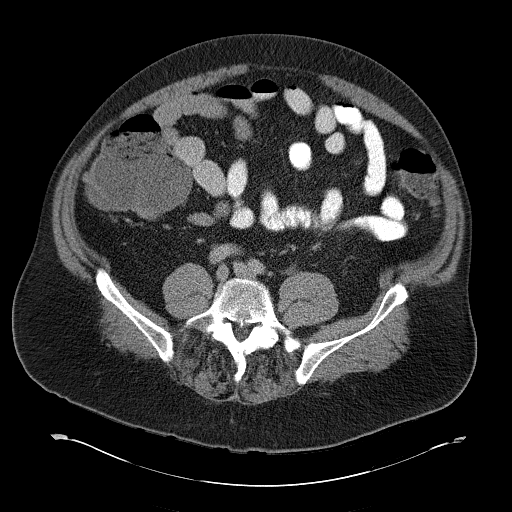
[im 54/98  soft-tissue]
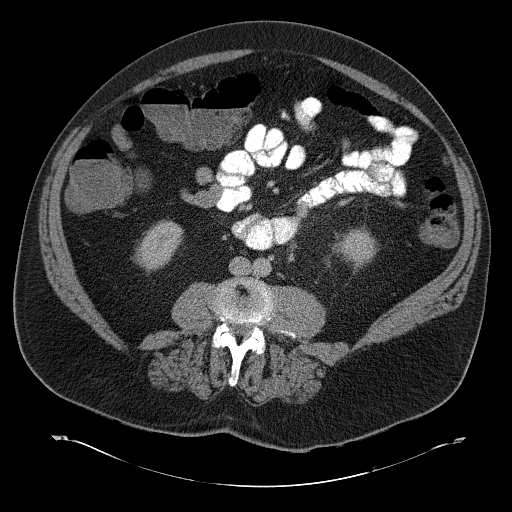
[im 60/98  soft-tissue]
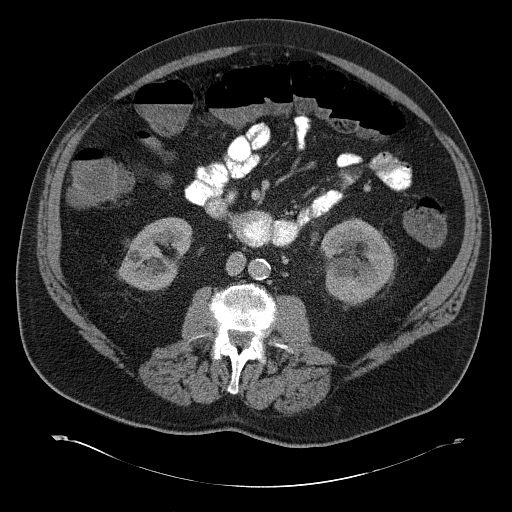
[im 71/98  soft-tissue]
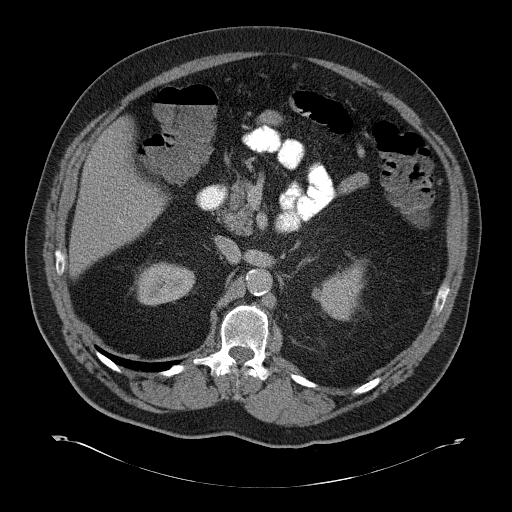
[im 71/98  bone]
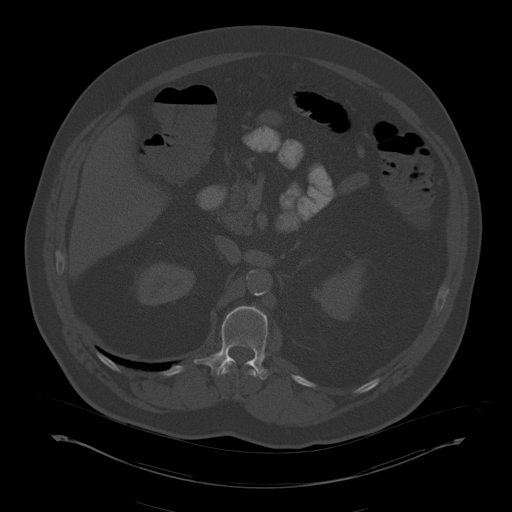
[im 76/98  soft-tissue]
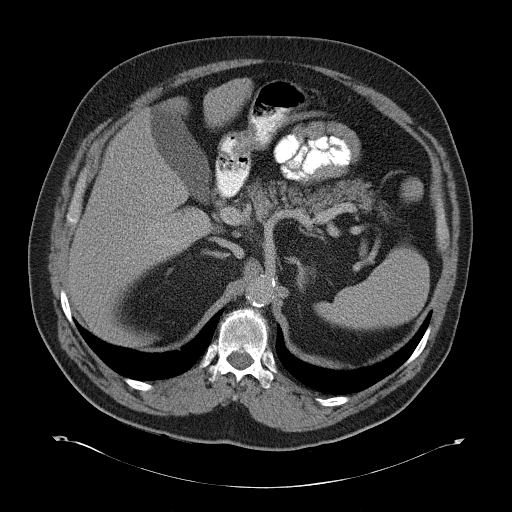
[im 81/98  soft-tissue]
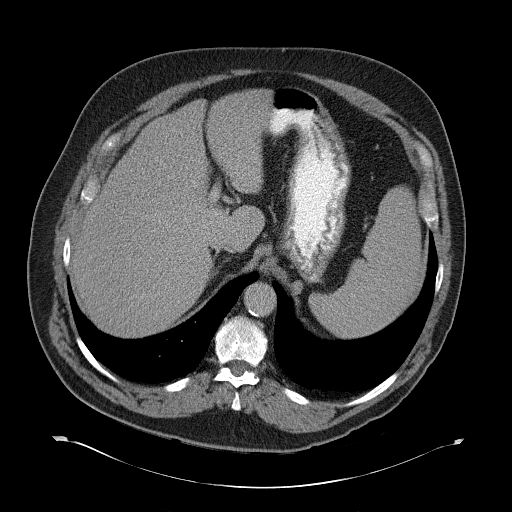
[im 92/98  soft-tissue]
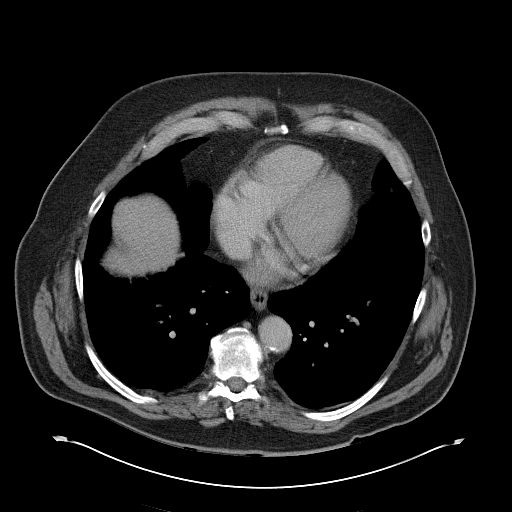

[Series 3: abd_pel_with 3.0 spo · coronal · 0.79mm/px · 3 of 120 slices shown]
[im 40/120  soft-tissue]
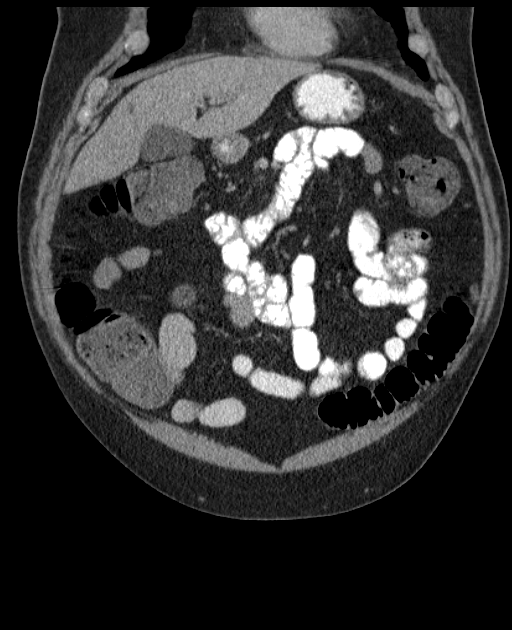
[im 53/120  soft-tissue]
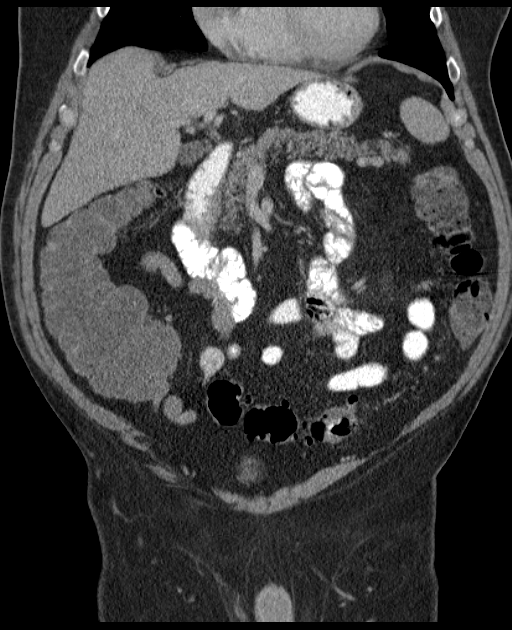
[im 67/120  soft-tissue]
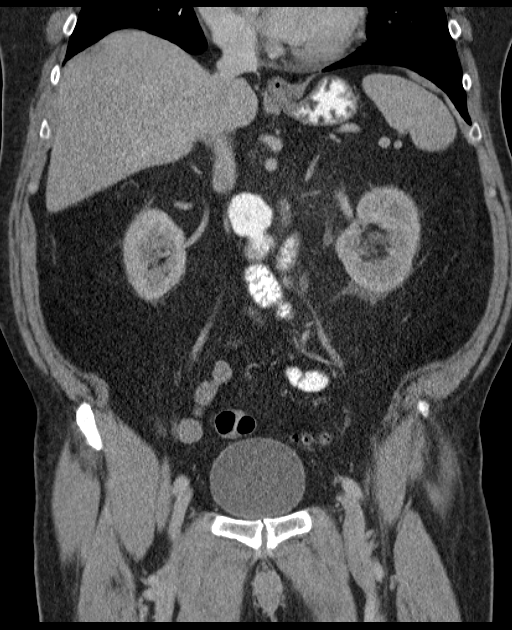

[15 of 46 positions shown; findings below may reference images not displayed]

FINDINGS: Mild subsegmental atelectasis seen dependently within the lung
bases. No pleural or pericardial effusion.

1 cm cyst present within the left hepatic lobe. Additional
subcentimeter hypodensity within the right hepatic lobe is too small
the characterize, but may represent additional small cyst. The liver
is otherwise unremarkable. Gallbladder within normal limits. No
biliary dilatation. The spleen, adrenal glands, and pancreas
demonstrate a normal contrast enhanced appearance.

1.4 cm cyst present within the right kidney. Additional tiny
subcentimeter hypodensities too small the characterize, but
statistically likely represents an additional small cyst. No
nephrolithiasis or hydronephrosis. No right-sided focal enhancing
renal mass.

On the left, there is moderate left hydroureteronephrosis with mild
left perinephric fat stranding. There is an obstructive 4 mm
calculus within the distal left ureter just proximal to the left
UVJ. No other stones seen within the left kidney or along the course
of the left renal collecting system. No focal enhancing left renal
mass. Subcentimeter exophytic cyst noted extending posteriorly from
the left kidney.

Stomach within normal limits. No evidence of bowel obstruction. No
abnormal wall thickening, mucosal enhancement, or inflammatory fat
stranding seen about the bowels. Sigmoid diverticulosis present
without acute diverticulitis.

Bladder is unremarkable. Prostate is enlarged measuring 6.4 cm in
transverse diameter with median lobe hypertrophy, invaginating upon
the base of the bladder.

Fat containing paraumbilical hernia present without associated
inflammatory changes.

No free air or fluid. No adenopathy. Moderate atherosclerotic
calcifications present within the intra-abdominal aorta without
aneurysm.

No acute osseous abnormality. No worrisome lytic or blastic osseous
lesions.
IMPRESSION: 1. Obstructive 4 mm stone within the distal left ureter with
secondary moderate left hydroureteronephrosis.
2. Sigmoid diverticulosis without acute diverticulitis.
3. Small fat containing paraumbilical hernia.
4. Enlarged prostate.

## 2014-09-10 MED ORDER — IOHEXOL 300 MG/ML  SOLN
50.0000 mL | Freq: Once | INTRAMUSCULAR | Status: AC | PRN
  Administered 2014-09-10: 50 mL via ORAL

## 2014-09-10 MED ORDER — HYDROMORPHONE HCL PF 1 MG/ML IJ SOLN
1.0000 mg | Freq: Once | INTRAMUSCULAR | Status: AC
  Administered 2014-09-10: 1 mg via INTRAVENOUS
  Filled 2014-09-10: qty 1

## 2014-09-10 MED ORDER — ONDANSETRON 4 MG PO TBDP: ORAL_TABLET | ORAL | Status: DC

## 2014-09-10 MED ORDER — SODIUM CHLORIDE 0.9 % IJ SOLN
INTRAMUSCULAR | Status: AC
  Filled 2014-09-10: qty 30

## 2014-09-10 MED ORDER — TAMSULOSIN HCL 0.4 MG PO CAPS: 0.4000 mg | ORAL_CAPSULE | Freq: Every day | ORAL | Status: DC

## 2014-09-10 MED ORDER — TAMSULOSIN HCL 0.4 MG PO CAPS
0.4000 mg | ORAL_CAPSULE | Freq: Once | ORAL | Status: AC
  Administered 2014-09-10: 0.4 mg via ORAL
  Filled 2014-09-10: qty 1

## 2014-09-10 MED ORDER — IOHEXOL 300 MG/ML  SOLN
80.0000 mL | Freq: Once | INTRAMUSCULAR | Status: AC | PRN
  Administered 2014-09-10: 80 mL via INTRAVENOUS

## 2014-09-10 MED ORDER — SODIUM CHLORIDE 0.9 % IJ SOLN
INTRAMUSCULAR | Status: AC
  Filled 2014-09-10: qty 350

## 2014-09-10 MED ORDER — ONDANSETRON HCL 4 MG/2ML IJ SOLN
4.0000 mg | Freq: Once | INTRAMUSCULAR | Status: AC
  Administered 2014-09-10: 4 mg via INTRAVENOUS
  Filled 2014-09-10: qty 2

## 2014-09-10 MED ORDER — OXYCODONE-ACETAMINOPHEN 5-325 MG PO TABS: 1.0000 | ORAL_TABLET | Freq: Four times a day (QID) | ORAL | Status: DC | PRN

## 2014-09-10 NOTE — ED Provider Notes (Signed)
CSN: [id number]     Arrival date & time 09/10/14  2010 History  This chart was scribed for Maudry Diego, MD by Tula Nakayama, ED Scribe. This patient was seen in room APA18/[facility]18 and the patient's care was started at 8:36 PM.     Chief Complaint  Patient presents with  . Abdominal Pain  . Constipation   The history is provided by the patient. No language interpreter was used.   HPI Comments: Vincent Collins is a 73 y.o. male who presents to the Emergency Department complaining of constant constipation and abdominal pain starting 5 days ago. He visited Dr. Hassell Done in [facility] 1 day ago where he was diagnosed with diverticulosis and was given Flagyl and Cipro PO at that time. Pt notes nausea and intermittent chills as associated symptoms. He states the abdominal pain is relieved by pain medication.   Past Medical History  Diagnosis Date  . Hypertension   . Hyperlipidemia   . COPD (chronic obstructive pulmonary disease)   . Diverticulitis   . Diabetes mellitus without complication    Past Surgical History  Procedure Laterality Date  . Spine surgery     Family History  Problem Relation Age of Onset  . Early death Father     MI age 56   History  Substance Use Topics  . Smoking status: Former Research scientist (life sciences)  . Smokeless tobacco: Not on file  . Alcohol Use: No    Review of Systems  Constitutional: Positive for chills. Negative for appetite change and fatigue.  HENT: Negative for congestion, ear discharge and sinus pressure.   Eyes: Negative for discharge.  Respiratory: Negative for cough.   Cardiovascular: Negative for chest pain.  Gastrointestinal: Positive for nausea, abdominal pain and constipation. Negative for diarrhea.  Genitourinary: Negative for frequency and hematuria.  Musculoskeletal: Negative for back pain.  Skin: Negative for rash.  Neurological: Negative for seizures and headaches.  Psychiatric/Behavioral: Negative for hallucinations.      Allergies   Diphenhydramine hcl  Home Medications   Prior to Admission medications   Medication Sig Start Date End Date Taking? Authorizing Provider  albuterol (PROVENTIL) (2.5 MG/3ML) 0.083% nebulizer solution NEBULIZE 1 VIAL FOUR TIMES DAILY AS NEEDED 08/25/14   Sharion Balloon, FNP  amLODipine (NORVASC) 5 MG tablet TAKE 1 TABLET DAILY    Sharion Balloon, FNP  aspirin 81 MG EC tablet Take 81 mg by mouth daily.      Historical Provider, MD  atorvastatin (LIPITOR) 40 MG tablet TAKE 1 TABLET DAILY    Christy A Hawks, FNP  benazepril (LOTENSIN) 20 MG tablet TAKE 1 TABLET DAILY    Christy A Hawks, FNP  Blood Glucose Monitoring Suppl (ACCU-CHEK AVIVA PLUS) W/DEVICE KIT 1 Device by Does not apply route daily. 06/29/14   Chipper Herb, MD  budesonide-formoterol [facility]) 80-4.5 MCG/ACT inhaler Inhale 2 puffs into the lungs 2 (two) times daily. 08/31/14   Sharion Balloon, FNP  Cholecalciferol (VITAMIN D) 1000 UNITS capsule Take 1,000 Units by mouth. 2 bid     Historical Provider, MD  ciprofloxacin (CIPRO) 500 MG tablet Take 1 tablet (500 mg total) by mouth 2 (two) times daily. 09/09/14   Mary-Margaret Hassell Done, FNP  fish oil-omega-3 fatty acids 1000 MG capsule Take 1 g by mouth daily. 3 daily     Historical Provider, MD  glucose blood (ACCU-CHEK AVIVA) test strip 1 each by Other route daily. Use as instructed 06/29/14   Chipper Herb, MD  glucose blood  test strip Use as instructed 06/28/14   Chipper Herb, MD  HYDROcodone-acetaminophen (NORCO/VICODIN) 5-325 MG per tablet Take 1 tablet by mouth every 6 (six) hours as needed for moderate pain. 09/09/14   Mary-Margaret Hassell Done, FNP  Lancets (ACCU-CHEK MULTICLIX) lancets Use as instructed 06/29/14   Chipper Herb, MD  loratadine (CLARITIN) 10 MG tablet Take 10 mg by mouth daily. prn    Historical Provider, MD  metFORMIN (GLUCOPHAGE) 500 MG tablet Take 1 tablet (500 mg total) by mouth every evening. With supper 06/08/14   Tammy Eckard, PHARMD  metroNIDAZOLE (FLAGYL) 500 MG  tablet Take 1 tablet (500 mg total) by mouth 2 (two) times daily. 09/09/14   Mary-Margaret Hassell Done, FNP  PROAIR HFA 108 (90 BASE) MCG/ACT inhaler 2 PUFFS EVERY 6 HOURS AS NEEDED FOR WHEEZING    Sharion Balloon, FNP  VALUE PLUS LANCETS THIN 26G MISC Check blood sugars once daily and as needed 06/28/14   Chipper Herb, MD   BP 150/78  Pulse 88  Temp(Src) 98.9 F (37.2 C) (Oral)  Resp 20  Ht $R'5\' 9"'FK$  (1.753 m)  Wt 250 lb (113.399 kg)  BMI 36.90 kg/m2  SpO2 96% Physical Exam  Nursing note and vitals reviewed. Constitutional: He is oriented to person, place, and time. He appears well-developed.  HENT:  Head: Normocephalic.  Eyes: Conjunctivae and EOM are normal. No scleral icterus.  Neck: Neck supple. No thyromegaly present.  Cardiovascular: Normal rate and regular rhythm.  Exam reveals no gallop and no friction rub.   No murmur heard. Pulmonary/Chest: No stridor. He has no wheezes. He has no rales. He exhibits no tenderness.  Abdominal: He exhibits distension. There is tenderness (mild LLQ). There is no rebound.  Mild LLQ tenderness  Musculoskeletal: Normal range of motion. He exhibits no edema.  Lymphadenopathy:    He has no cervical adenopathy.  Neurological: He is oriented to person, place, and time. He exhibits normal muscle tone. Coordination normal.  Skin: No rash noted. No erythema.  Psychiatric: He has a normal mood and affect. His behavior is normal.    ED Course  Procedures (including critical care time)  DIAGNOSTIC STUDIES: Oxygen Saturation is 96% on RA, adequate by my interpretation.    COORDINATION OF CARE: 8:40 PM Order labwork, CT A/P w/ contrast. Discussed treatment plan with pt. Pt agrees to treatment plan.  Labs Review Labs Reviewed - No data to display  Imaging Review No results found.   EKG Interpretation None      MDM   Final diagnoses:  None    Kidney stone   The chart was scribed for me under my direct supervision.  I personally performed the  history, physical, and medical decision making and all procedures in the evaluation of this patient.Maudry Diego, MD 09/10/14 [phone]

## 2014-09-10 NOTE — ED Notes (Signed)
Pt gone over to CT 

## 2014-09-10 NOTE — ED Notes (Signed)
Pt states he was dx with diverticulitis yesterday. Pt has been on flagyl and cipro PO now for about 24hr and still having LLQ pain. Pt reports constipation as well.

## 2014-09-10 NOTE — Discharge Instructions (Signed)
Follow up with [facility] in [place] or [place] this week.

## 2014-09-10 NOTE — ED Notes (Signed)
Called CT to inform that pt has finished contrast.

## 2014-09-18 ENCOUNTER — Encounter: Payer: Self-pay | Admitting: Family

## 2014-09-18 ENCOUNTER — Ambulatory Visit (INDEPENDENT_AMBULATORY_CARE_PROVIDER_SITE_OTHER): Payer: Medicare Other | Admitting: Family

## 2014-09-18 ENCOUNTER — Telehealth: Payer: Self-pay | Admitting: Family

## 2014-09-18 VITALS — BP 111/65 | HR 74 | Temp 97.2°F | Ht 69.0 in | Wt 250.4 lb

## 2014-09-18 DIAGNOSIS — Z09 Encounter for follow-up examination after completed treatment for conditions other than malignant neoplasm: Secondary | ICD-10-CM

## 2014-09-18 DIAGNOSIS — N200 Calculus of kidney: Secondary | ICD-10-CM

## 2014-09-18 MED ORDER — OXYCODONE-ACETAMINOPHEN 5-325 MG PO TABS: 1.0000 | ORAL_TABLET | Freq: Four times a day (QID) | ORAL | Status: DC | PRN

## 2014-09-18 NOTE — Progress Notes (Signed)
   Subjective:    Patient ID: Vincent Collins, male    DOB: 28-Jun-1941, 73 y.o.   MRN: [id number]  HPI Pt presents to office for follow-up for kidney stone. Pt went to ED on 09/13 and had a CT scan done and pt was told he had a kidney stone. Pt states he has not passed the stone yet. PT states he has an appointment with an urologists tomorrow. Pt states his intermittent sharp pain 9 out 10.    Review of Systems  Constitutional: Negative.   HENT: Negative.   Respiratory: Negative.   Cardiovascular: Negative.   Gastrointestinal: Negative.   Endocrine: Negative.   Genitourinary: Positive for flank pain. Negative for dysuria.  Musculoskeletal: Negative.   Neurological: Negative.   Hematological: Negative.   Psychiatric/Behavioral: Negative.   All other systems reviewed and are negative.      Objective:   Physical Exam  Vitals reviewed. Constitutional: He is oriented to person, place, and time. He appears well-developed and well-nourished. No distress.  Cardiovascular: Normal rate, regular rhythm, normal heart sounds and intact distal pulses.   No murmur heard. Pulmonary/Chest: Effort normal and breath sounds normal. No respiratory distress. He has no wheezes.  Abdominal: Soft. Bowel sounds are normal. He exhibits no distension. There is no tenderness.  Musculoskeletal: Normal range of motion. He exhibits no edema and no tenderness.  Neurological: He is alert and oriented to person, place, and time. He has normal reflexes. No cranial nerve deficit.  Skin: Skin is warm and dry. No rash noted. No erythema.  Psychiatric: He has a normal mood and affect. His behavior is normal. Judgment and thought content normal.     BP 111/65  Pulse 74  Temp(Src) 97.2 F (36.2 C) (Oral)  Ht  (1.753 m)  Wt 250 lb 6.4 oz (113.581 kg)  BMI 36.96 kg/m2      Assessment & Plan:  1. Hospital discharge follow-up  2. Kidney stone -Keep appointment with Urologists -Force fluids -RTO prn -  oxyCODONE-acetaminophen (PERCOCET/ROXICET) 5-325 MG per tablet; Take 1 tablet by mouth every 6 (six) hours as needed.  Dispense: 30 tablet; Refill: 0  Jannifer Rodney, FNP

## 2014-09-18 NOTE — Patient Instructions (Signed)

## 2014-09-18 NOTE — Telephone Encounter (Signed)
Pt coming in 9/21 to see Christy.

## 2014-09-20 ENCOUNTER — Inpatient Hospital Stay (HOSPITAL_COMMUNITY)
Admission: EM | Admit: 2014-09-20 | Discharge: 2014-09-22 | DRG: 916 | Disposition: A | Discharge status: Home / Self Care | Payer: Medicare Other | Attending: Internal Medicine | Admitting: Internal Medicine

## 2014-09-20 ENCOUNTER — Encounter (HOSPITAL_COMMUNITY): Payer: Self-pay | Admitting: Emergency Medicine

## 2014-09-20 ENCOUNTER — Other Ambulatory Visit: Payer: Self-pay | Admitting: Urology

## 2014-09-20 DIAGNOSIS — Z7901 Long term (current) use of anticoagulants: Secondary | ICD-10-CM

## 2014-09-20 DIAGNOSIS — E119 Type 2 diabetes mellitus without complications: Secondary | ICD-10-CM

## 2014-09-20 DIAGNOSIS — N179 Acute kidney failure, unspecified: Secondary | ICD-10-CM | POA: Diagnosis present

## 2014-09-20 DIAGNOSIS — Z79899 Other long term (current) drug therapy: Secondary | ICD-10-CM | POA: Diagnosis not present

## 2014-09-20 DIAGNOSIS — J449 Chronic obstructive pulmonary disease, unspecified: Secondary | ICD-10-CM | POA: Diagnosis present

## 2014-09-20 DIAGNOSIS — F411 Generalized anxiety disorder: Secondary | ICD-10-CM | POA: Diagnosis present

## 2014-09-20 DIAGNOSIS — I10 Essential (primary) hypertension: Secondary | ICD-10-CM | POA: Diagnosis present

## 2014-09-20 DIAGNOSIS — N400 Enlarged prostate without lower urinary tract symptoms: Secondary | ICD-10-CM | POA: Diagnosis present

## 2014-09-20 DIAGNOSIS — IMO0001 Reserved for inherently not codable concepts without codable children: Secondary | ICD-10-CM | POA: Diagnosis present

## 2014-09-20 DIAGNOSIS — Z87891 Personal history of nicotine dependence: Secondary | ICD-10-CM

## 2014-09-20 DIAGNOSIS — T783XXA Angioneurotic edema, initial encounter: Secondary | ICD-10-CM | POA: Diagnosis present

## 2014-09-20 DIAGNOSIS — T380X5A Adverse effect of glucocorticoids and synthetic analogues, initial encounter: Secondary | ICD-10-CM | POA: Diagnosis present

## 2014-09-20 DIAGNOSIS — K2970 Gastritis, unspecified, without bleeding: Secondary | ICD-10-CM

## 2014-09-20 DIAGNOSIS — E785 Hyperlipidemia, unspecified: Secondary | ICD-10-CM | POA: Diagnosis present

## 2014-09-20 DIAGNOSIS — Z23 Encounter for immunization: Secondary | ICD-10-CM | POA: Diagnosis not present

## 2014-09-20 DIAGNOSIS — F419 Anxiety disorder, unspecified: Secondary | ICD-10-CM | POA: Diagnosis present

## 2014-09-20 DIAGNOSIS — N059 Unspecified nephritic syndrome with unspecified morphologic changes: Secondary | ICD-10-CM | POA: Diagnosis present

## 2014-09-20 DIAGNOSIS — N1330 Unspecified hydronephrosis: Secondary | ICD-10-CM | POA: Diagnosis present

## 2014-09-20 DIAGNOSIS — E1165 Type 2 diabetes mellitus with hyperglycemia: Secondary | ICD-10-CM

## 2014-09-20 DIAGNOSIS — R220 Localized swelling, mass and lump, head: Secondary | ICD-10-CM | POA: Diagnosis not present

## 2014-09-20 DIAGNOSIS — R221 Localized swelling, mass and lump, neck: Secondary | ICD-10-CM | POA: Diagnosis not present

## 2014-09-20 DIAGNOSIS — T46905A Adverse effect of unspecified agents primarily affecting the cardiovascular system, initial encounter: Secondary | ICD-10-CM | POA: Diagnosis present

## 2014-09-20 DIAGNOSIS — E1159 Type 2 diabetes mellitus with other circulatory complications: Secondary | ICD-10-CM | POA: Diagnosis present

## 2014-09-20 DIAGNOSIS — E559 Vitamin D deficiency, unspecified: Secondary | ICD-10-CM

## 2014-09-20 DIAGNOSIS — J4489 Other specified chronic obstructive pulmonary disease: Secondary | ICD-10-CM | POA: Diagnosis present

## 2014-09-20 DIAGNOSIS — N201 Calculus of ureter: Secondary | ICD-10-CM

## 2014-09-20 HISTORY — DX: Unspecified hydronephrosis: N13.30

## 2014-09-20 HISTORY — DX: Calculus of ureter: N20.1

## 2014-09-20 LAB — CBC WITH DIFFERENTIAL/PLATELET
BASOS PCT: 0 % (ref 0–1)
Basophils Absolute: 0 10*3/uL (ref 0.0–0.1)
EOS PCT: 2 % (ref 0–5)
Eosinophils Absolute: 0.2 10*3/uL (ref 0.0–0.7)
HCT: 45.1 % (ref 39.0–52.0)
HEMOGLOBIN: 15.8 g/dL (ref 13.0–17.0)
LYMPHS PCT: 17 % (ref 12–46)
Lymphs Abs: 2.3 10*3/uL (ref 0.7–4.0)
MCH: 30.5 pg (ref 26.0–34.0)
MCHC: 35 g/dL (ref 30.0–36.0)
MCV: 87.1 fL (ref 78.0–100.0)
MONO ABS: 1.4 10*3/uL — AB (ref 0.1–1.0)
MONOS PCT: 10 % (ref 3–12)
NEUTROS ABS: 9.5 10*3/uL — AB (ref 1.7–7.7)
Neutrophils Relative %: 71 % (ref 43–77)
Platelets: 217 10*3/uL (ref 150–400)
RBC: 5.18 MIL/uL (ref 4.22–5.81)
RDW: 12.9 % (ref 11.5–15.5)
WBC: 13.4 10*3/uL — ABNORMAL HIGH (ref 4.0–10.5)

## 2014-09-20 LAB — COMPREHENSIVE METABOLIC PANEL
ALT: 23 U/L (ref 0–53)
ANION GAP: 13 (ref 5–15)
AST: 18 U/L (ref 0–37)
Albumin: 3.4 g/dL — ABNORMAL LOW (ref 3.5–5.2)
Alkaline Phosphatase: 96 U/L (ref 39–117)
BILIRUBIN TOTAL: 0.7 mg/dL (ref 0.3–1.2)
BUN: 29 mg/dL — ABNORMAL HIGH (ref 6–23)
CALCIUM: 9.1 mg/dL (ref 8.4–10.5)
CO2: 28 meq/L (ref 19–32)
CREATININE: 2.1 mg/dL — AB (ref 0.50–1.35)
Chloride: 95 mEq/L — ABNORMAL LOW (ref 96–112)
GFR calc Af Amer: 35 mL/min — ABNORMAL LOW (ref >90)
GFR, EST NON AFRICAN AMERICAN: 30 mL/min — AB (ref >90)
Glucose, Bld: 140 mg/dL — ABNORMAL HIGH (ref 70–99)
Potassium: 4.3 mEq/L (ref 3.7–5.3)
Sodium: 136 mEq/L — ABNORMAL LOW (ref 137–147)
Total Protein: 7.2 g/dL (ref 6.0–8.3)

## 2014-09-20 LAB — URINALYSIS, ROUTINE W REFLEX MICROSCOPIC
BILIRUBIN URINE: NEGATIVE
Glucose, UA: NEGATIVE mg/dL
KETONES UR: NEGATIVE mg/dL
LEUKOCYTES UA: NEGATIVE
NITRITE: NEGATIVE
Protein, ur: NEGATIVE mg/dL
Specific Gravity, Urine: 1.01 (ref 1.005–1.030)
UROBILINOGEN UA: 0.2 mg/dL (ref 0.0–1.0)
pH: 6 (ref 5.0–8.0)

## 2014-09-20 LAB — URINE MICROSCOPIC-ADD ON

## 2014-09-20 LAB — TROPONIN I

## 2014-09-20 MED ORDER — METHYLPREDNISOLONE SODIUM SUCC 125 MG IJ SOLR
125.0000 mg | Freq: Once | INTRAMUSCULAR | Status: AC
  Administered 2014-09-20: 125 mg via INTRAVENOUS
  Filled 2014-09-20: qty 2

## 2014-09-20 MED ORDER — DIPHENHYDRAMINE HCL 50 MG/ML IJ SOLN
25.0000 mg | Freq: Once | INTRAMUSCULAR | Status: AC
  Administered 2014-09-20: 25 mg via INTRAVENOUS
  Filled 2014-09-20: qty 1

## 2014-09-20 MED ORDER — FAMOTIDINE 20 MG PO TABS: 20.0000 mg | ORAL_TABLET | Freq: Two times a day (BID) | ORAL | Status: DC

## 2014-09-20 MED ORDER — PREDNISONE 50 MG PO TABS: ORAL_TABLET | ORAL | Status: DC

## 2014-09-20 MED ORDER — SODIUM CHLORIDE 0.9 % IV SOLN
INTRAVENOUS | Status: DC
  Administered 2014-09-20: 23:00:00 via INTRAVENOUS

## 2014-09-20 MED ORDER — FAMOTIDINE IN NACL 20-0.9 MG/50ML-% IV SOLN
20.0000 mg | Freq: Once | INTRAVENOUS | Status: AC
  Administered 2014-09-20: 20 mg via INTRAVENOUS
  Filled 2014-09-20: qty 50

## 2014-09-20 MED ORDER — SODIUM CHLORIDE 0.9 % IV BOLUS (SEPSIS)
1000.0000 mL | Freq: Once | INTRAVENOUS | Status: AC
  Administered 2014-09-20: 1000 mL via INTRAVENOUS

## 2014-09-20 MED ORDER — DIPHENHYDRAMINE HCL 25 MG PO TABS: 25.0000 mg | ORAL_TABLET | Freq: Four times a day (QID) | ORAL | Status: DC

## 2014-09-20 NOTE — ED Notes (Signed)
Pt's lips have been swelling since this am. Pt states he is on new meds for kidney stones.

## 2014-09-20 NOTE — H&P (Signed)
PCP:   Rudi Heap, MD   Chief Complaint:  Lip swelling  HPI:  73 year old male who  has a past medical history of Hypertension; Hyperlipidemia; COPD (chronic obstructive pulmonary disease); Diverticulitis; and Diabetes mellitus without complication. Today presents to the ED after patient started having swelling of the upper and lower lip since 7 am this morning. Patient says the past one week he has been having itching on the left side of the face and nose, and this morning he started having swelling of the upper and lower lip. The itching got better but the swelling persisted. He denies shortness of breath or difficulty swallowing. No tongue swelling. Patient takes benazepril for hypertension, he was recently started on Flomax for BPH. Patient also has been getting Percocet when necessary for pain for the kidney stones. Patient was seen in the ED on 09/10/2014 for kidney stone, CT scan of the abdomen was done which showed of Dr. 4 mm stone within the distal left ureter with secondary moderate left lateral ureterohydro nephrosis.  At that time patient also received IV contrast, today's patient creatinine is 2.10 his baseline creatinine is around 1. He denies chest pain, no dysuria urgency or frequency of urination. Denies fever  Allergies:   Allergies  Allergen Reactions  . Diphenhydramine Hcl Other (See Comments)    Elevates heart rate      Past Medical History  Diagnosis Date  . Hypertension   . Hyperlipidemia   . COPD (chronic obstructive pulmonary disease)   . Diverticulitis   . Diabetes mellitus without complication     Past Surgical History  Procedure Laterality Date  . Spine surgery      Prior to Admission medications   Medication Sig Start Date End Date Taking? Authorizing Provider  amLODipine (NORVASC) 5 MG tablet Take 5 mg by mouth daily.   Yes Historical Provider, MD  aspirin 81 MG EC tablet Take 81 mg by mouth daily.     Yes Historical Provider, MD    atorvastatin (LIPITOR) 40 MG tablet Take 40 mg by mouth daily.   Yes Historical Provider, MD  benazepril (LOTENSIN) 20 MG tablet Take 20 mg by mouth daily.   Yes Historical Provider, MD  budesonide-formoterol (SYMBICORT) 80-4.5 MCG/ACT inhaler Inhale 2 puffs into the lungs 2 (two) times daily. 08/31/14  Yes Junie Spencer, FNP  Cholecalciferol (VITAMIN D) 1000 UNITS capsule Take 1,000 Units by mouth 2 (two) times daily.    Yes Historical Provider, MD  ciprofloxacin (CIPRO) 500 MG tablet Take 500 mg by mouth 2 (two) times daily. Started 09/09/2014   Yes Historical Provider, MD  fish oil-omega-3 fatty acids 1000 MG capsule Take 1-2 g by mouth 2 (two) times daily. Patient takes 2g in the morning and 1g at night   Yes Historical Provider, MD  HYDROcodone-acetaminophen (NORCO/VICODIN) 5-325 MG per tablet Take 1 tablet by mouth every 6 (six) hours as needed for moderate pain. 09/09/14  Yes Mary-Margaret Daphine Deutscher, FNP  loratadine (CLARITIN) 10 MG tablet Take 10 mg by mouth daily as needed for allergies.    Yes Historical Provider, MD  metFORMIN (GLUCOPHAGE) 500 MG tablet Take 1 tablet (500 mg total) by mouth every evening. With supper 06/08/14  Yes Tammy Eckard, PHARMD  ondansetron (ZOFRAN) 4 MG tablet Take 4 mg by mouth every 6 (six) hours as needed for nausea or vomiting.   Yes Historical Provider, MD  oxyCODONE-acetaminophen (PERCOCET/ROXICET) 5-325 MG per tablet Take 1 tablet by mouth every 6 (six) hours as needed.  09/18/14  Yes Junie Spencer, FNP  tamsulosin (FLOMAX) 0.4 MG CAPS capsule Take 1 capsule (0.4 mg total) by mouth daily. 09/10/14  Yes Benny Lennert, MD  albuterol (PROAIR HFA) 108 (90 BASE) MCG/ACT inhaler Inhale 2 puffs into the lungs every 6 (six) hours as needed for wheezing.    Historical Provider, MD  albuterol (PROVENTIL) (2.5 MG/3ML) 0.083% nebulizer solution Take 2.5 mg by nebulization 4 (four) times daily as needed for wheezing or shortness of breath.    Historical Provider, MD   diphenhydrAMINE (BENADRYL) 25 MG tablet Take 1 tablet (25 mg total) by mouth every 6 (six) hours. 09/20/14   Glynn Octave, MD  famotidine (PEPCID) 20 MG tablet Take 1 tablet (20 mg total) by mouth 2 (two) times daily. 09/20/14   Glynn Octave, MD  predniSONE (DELTASONE) 50 MG tablet 1 tablet PO daily 09/20/14   Glynn Octave, MD    Social History:  reports that he has quit smoking. He does not have any smokeless tobacco history on file. He reports that he does not drink alcohol or use illicit drugs.  Family History  Problem Relation Age of Onset  . Early death Father     MI age 37     All the positives are listed in BOLD  Review of Systems:  HEENT: Headache, blurred vision, runny nose, sore throat Neck: Hypothyroidism, hyperthyroidism,,lymphadenopathy Chest : Shortness of breath, history of COPD, Asthma Heart : Chest pain, history of coronary arterey disease GI:  Nausea, vomiting, diarrhea, constipation, GERD GU: Dysuria, urgency, frequency of urination, hematuria, kidney stones Neuro: Stroke, seizures, syncope Psych: Depression, anxiety, hallucinations   Physical Exam: Blood pressure 128/66, pulse 70, temperature 97.7 F (36.5 C), temperature source Oral, resp. rate 16, height  (1.727 m), weight 113.399 kg (250 lb), SpO2 97.00%. Constitutional:   Patient is a well-developed and well-nourished male* in no acute distress and cooperative with exam. Head: Normocephalic and atraumatic Mouth: Marked swelling of both upper and lower lip Eyes: PERRL, EOMI, conjunctivae normal Neck: Supple, No Thyromegaly Cardiovascular: RRR, S1 normal, S2 normal Pulmonary/Chest: CTAB, no wheezes, rales, or rhonchi Abdominal: Soft. Non-tender, non-distended, bowel sounds are normal, no masses, organomegaly, or guarding present.  Neurological: A&O x3, Strenght is normal and symmetric bilaterally, cranial nerve II-XII are grossly intact, no focal motor deficit, sensory intact to light touch  bilaterally.  Extremities : No Cyanosis, Clubbing or Edema  Labs on Admission:  Basic Metabolic Panel:  Recent Labs Lab 09/20/14 2007  NA 136*  K 4.3  CL 95*  CO2 28  GLUCOSE 140*  BUN 29*  CREATININE 2.10*  CALCIUM 9.1   Liver Function Tests:  Recent Labs Lab 09/20/14 2007  AST 18  ALT 23  ALKPHOS 96  BILITOT 0.7  PROT 7.2  ALBUMIN 3.4*   No results found for this basename: LIPASE, AMYLASE,  in the last 168 hours No results found for this basename: AMMONIA,  in the last 168 hours CBC:  Recent Labs Lab 09/20/14 2007  WBC 13.4*  NEUTROABS 9.5*  HGB 15.8  HCT 45.1  MCV 87.1  PLT 217   Cardiac Enzymes:  Recent Labs Lab 09/20/14 2007  TROPONINI <0.30    BNP (last 3 results) No results found for this basename: PROBNP,  in the last 8760 hours CBG: No results found for this basename: GLUCAP,  in the last 168 hours  Radiological Exams on Admission: No results found.  EKG: Independently reviewed. Sinus rhythm   Assessment/Plan Principal Problem:  Angioedema Active Problems:   HTN (hypertension)   Type 2 diabetes mellitus  Angioedema Patient likely has angio edema from ACE inhibitor benazepril, he also was recently started on Flomax. I will hold both benazepril and Flomax at this time. We'll start Solu-Medrol 60 mg IV every 6 hours, will start Benadryl 25 mg IV q 6 hrs.   AKI Likely has contrast induced nephropathy, will start IV fluids. Will check ultrasound of the kidneys.   Diabetes mellitus Will start sliding scale insulin with novolog.  Code status: patient is full code  Family discussion: Admission, patients condition and plan of care including tests being ordered have been discussed with the patient and *his wife at bedside who indicate understanding and agree with the plan and Code Status.  Time Spent on Admission: 60 min  Kaseem Vastine S [facility] Pager: [phone] 09/20/2014, 11:27 PM  If 7PM-7AM, please contact  night-coverage  [email or web address]  Password [email or web address]

## 2014-09-20 NOTE — ED Provider Notes (Signed)
CSN: [id number]     Arrival date & time 09/20/14  1943 History   First MD Initiated Contact with Patient 09/20/14 2004     Chief Complaint  Patient presents with  . Allergic Reaction     (Consider location/radiation/quality/duration/timing/severity/associated sxs/prior Treatment) HPI Comments: Patient complains of swelling to his upper and lower lips since waking up at 7 AM this morning. It has been getting progressively worse. He denies any difficulty breathing or swallowing. No chest pain. He was recently started on Flomax for kidney stones. However he also takes benazepril and amlodipine. Denies any chest pain or shortness of breath. Nothing makes the symptoms better. He feels the swelling is getting worse. Denies any swelling of his throat or tongue.   The history is provided by the patient.    Past Medical History  Diagnosis Date  . Hypertension   . Hyperlipidemia   . COPD (chronic obstructive pulmonary disease)   . Diverticulitis   . Diabetes mellitus without complication    Past Surgical History  Procedure Laterality Date  . Spine surgery     Family History  Problem Relation Age of Onset  . Early death Father     MI age 4   History  Substance Use Topics  . Smoking status: Former Games developer  . Smokeless tobacco: Not on file  . Alcohol Use: No    Review of Systems  Constitutional: Negative for activity change and appetite change.  HENT: Positive for facial swelling. Negative for congestion and trouble swallowing.   Respiratory: Negative for cough, chest tightness and shortness of breath.   Cardiovascular: Negative for chest pain.  Gastrointestinal: Negative for nausea and abdominal pain.  Genitourinary: Negative for dysuria and hematuria.  Musculoskeletal: Negative for arthralgias, back pain and myalgias.  Skin: Negative for rash.  Neurological: Negative for dizziness, weakness and headaches.  A complete 10 system review of systems was obtained and all systems are  negative except as noted in the HPI and PMH.      Allergies  Diphenhydramine hcl  Home Medications   Prior to Admission medications   Medication Sig Start Date End Date Taking? Authorizing Provider  amLODipine (NORVASC) 5 MG tablet Take 5 mg by mouth daily.   Yes Historical Provider, MD  aspirin 81 MG EC tablet Take 81 mg by mouth daily.     Yes Historical Provider, MD  atorvastatin (LIPITOR) 40 MG tablet Take 40 mg by mouth daily.   Yes Historical Provider, MD  benazepril (LOTENSIN) 20 MG tablet Take 20 mg by mouth daily.   Yes Historical Provider, MD  budesonide-formoterol (SYMBICORT) 80-4.5 MCG/ACT inhaler Inhale 2 puffs into the lungs 2 (two) times daily. 08/31/14  Yes Junie Spencer, FNP  Cholecalciferol (VITAMIN D) 1000 UNITS capsule Take 1,000 Units by mouth 2 (two) times daily.    Yes Historical Provider, MD  ciprofloxacin (CIPRO) 500 MG tablet Take 500 mg by mouth 2 (two) times daily. Started 09/09/2014   Yes Historical Provider, MD  fish oil-omega-3 fatty acids 1000 MG capsule Take 1-2 g by mouth 2 (two) times daily. Patient takes 2g in the morning and 1g at night   Yes Historical Provider, MD  HYDROcodone-acetaminophen (NORCO/VICODIN) 5-325 MG per tablet Take 1 tablet by mouth every 6 (six) hours as needed for moderate pain. 09/09/14  Yes Mary-Margaret Daphine Deutscher, FNP  loratadine (CLARITIN) 10 MG tablet Take 10 mg by mouth daily as needed for allergies.    Yes Historical Provider, MD  metFORMIN (GLUCOPHAGE) 500 MG  tablet Take 1 tablet (500 mg total) by mouth every evening. With supper 06/08/14  Yes Tammy Eckard, PHARMD  ondansetron (ZOFRAN) 4 MG tablet Take 4 mg by mouth every 6 (six) hours as needed for nausea or vomiting.   Yes Historical Provider, MD  oxyCODONE-acetaminophen (PERCOCET/ROXICET) 5-325 MG per tablet Take 1 tablet by mouth every 6 (six) hours as needed. 09/18/14  Yes Junie Spencer, FNP  tamsulosin (FLOMAX) 0.4 MG CAPS capsule Take 1 capsule (0.4 mg total) by mouth daily.  09/10/14  Yes Benny Lennert, MD  albuterol (PROAIR HFA) 108 (90 BASE) MCG/ACT inhaler Inhale 2 puffs into the lungs every 6 (six) hours as needed for wheezing.    Historical Provider, MD  albuterol (PROVENTIL) (2.5 MG/3ML) 0.083% nebulizer solution Take 2.5 mg by nebulization 4 (four) times daily as needed for wheezing or shortness of breath.    Historical Provider, MD  diphenhydrAMINE (BENADRYL) 25 MG tablet Take 1 tablet (25 mg total) by mouth every 6 (six) hours. 09/20/14   Glynn Octave, MD  famotidine (PEPCID) 20 MG tablet Take 1 tablet (20 mg total) by mouth 2 (two) times daily. 09/20/14   Glynn Octave, MD  predniSONE (DELTASONE) 50 MG tablet 1 tablet PO daily 09/20/14   Glynn Octave, MD   BP 128/66  Pulse 70  Temp(Src) 97.7 F (36.5 C) (Oral)  Resp 16  Ht  (1.727 m)  Wt 250 lb (113.399 kg)  BMI 38.02 kg/m2  SpO2 97% Physical Exam  Nursing note and vitals reviewed. Constitutional: He is oriented to person, place, and time. He appears well-developed and well-nourished. No distress.  HENT:  Head: Normocephalic and atraumatic.  Mouth/Throat: Oropharynx is clear and moist. No oropharyngeal exudate.  Swelling of the upper and lower lips Tongue does not appear to be swollen Oropharynx appears to be clear No asymmetry. Uvula midline Floor of mouth soft  Eyes: Conjunctivae and EOM are normal. Pupils are equal, round, and reactive to light.  Neck: Normal range of motion. Neck supple.  No meningismus.  Cardiovascular: Normal rate, regular rhythm, normal heart sounds and intact distal pulses.   No murmur heard. Pulmonary/Chest: Effort normal and breath sounds normal. No respiratory distress.  Abdominal: Soft. There is no tenderness. There is no rebound and no guarding.  Musculoskeletal: Normal range of motion. He exhibits no edema and no tenderness.  Neurological: He is alert and oriented to person, place, and time. No cranial nerve deficit. He exhibits normal muscle tone.  Coordination normal.  No ataxia on finger to nose bilaterally. No pronator drift. 5/5 strength throughout. CN 2-12 intact. Negative Romberg. Equal grip strength. Sensation intact. Gait is normal.   Skin: Skin is warm.  Psychiatric: He has a normal mood and affect. His behavior is normal.    ED Course  Procedures (including critical care time) Labs Review Labs Reviewed  CBC WITH DIFFERENTIAL - Abnormal; Notable for the following:    WBC 13.4 (*)    Neutro Abs 9.5 (*)    Monocytes Absolute 1.4 (*)    All other components within normal limits  COMPREHENSIVE METABOLIC PANEL - Abnormal; Notable for the following:    Sodium 136 (*)    Chloride 95 (*)    Glucose, Bld 140 (*)    BUN 29 (*)    Creatinine, Ser 2.10 (*)    Albumin 3.4 (*)    GFR calc non Af Amer 30 (*)    GFR calc Af Amer 35 (*)    All  other components within normal limits  TROPONIN I    Imaging Review No results found.   EKG Interpretation   Date/Time:  Wednesday September 20 2014 20:14:59 EDT Ventricular Rate:  83 PR Interval:  212 QRS Duration: 100 QT Interval:  376 QTC Calculation: 442 R Axis:   -33 Text Interpretation:  Sinus rhythm Borderline prolonged PR interval  Abnormal R-wave progression, late transition Inferior infarct, old No  significant change was found Confirmed by Manus Gunning  MD, Josemaria Brining [phone]) on  09/20/2014 8:22:18 PM      MDM   Final diagnoses:  Angioedema, initial encounter  Acute renal failure, unspecified acute renal failure type   Angioedema likely secondary to ACE inhibitor use. Airway patent. No swelling of tongue or posterior pharynx.  Patient given antihistamines and Solu-Medrol.  Recheck at 9:30 PM. The swelling has improved. No swelling of tongue or posterior pharynx.  Patient prefers not to be admitted to the hospital.  He is instructed he needs to stop the ACE inhibitor and amlodipine use.  Patient with worsening kidney function and creatinine 2.1. Was 1.6 10 days ago.  He received IV contrast at that time. Previously creatinine was 0.8.  Patient will be admitted for continued observation of his angioedema as well as correction of his renal failure. D/w Dr. Sharl Ma.   CRITICAL CARE Performed by: Glynn Octave Total critical care time: 30 Critical care time was exclusive of separately billable procedures and treating other patients. Critical care was necessary to treat or prevent imminent or life-threatening deterioration. Critical care was time spent personally by me on the following activities: development of treatment plan with patient and/or surrogate as well as nursing, discussions with consultants, evaluation of patient's response to treatment, examination of patient, obtaining history from patient or surrogate, ordering and performing treatments and interventions, ordering and review of laboratory studies, ordering and review of radiographic studies, pulse oximetry and re-evaluation of patient's condition.   Glynn Octave, MD 09/20/14 2224

## 2014-09-21 ENCOUNTER — Observation Stay (HOSPITAL_COMMUNITY): Payer: Medicare Other

## 2014-09-21 ENCOUNTER — Encounter (HOSPITAL_COMMUNITY): Payer: Self-pay | Admitting: *Deleted

## 2014-09-21 DIAGNOSIS — N201 Calculus of ureter: Secondary | ICD-10-CM | POA: Diagnosis present

## 2014-09-21 DIAGNOSIS — N1330 Unspecified hydronephrosis: Secondary | ICD-10-CM | POA: Diagnosis present

## 2014-09-21 DIAGNOSIS — N179 Acute kidney failure, unspecified: Secondary | ICD-10-CM | POA: Diagnosis present

## 2014-09-21 LAB — CBC
HCT: 43.4 % (ref 39.0–52.0)
HEMOGLOBIN: 15 g/dL (ref 13.0–17.0)
MCH: 29.9 pg (ref 26.0–34.0)
MCHC: 34.6 g/dL (ref 30.0–36.0)
MCV: 86.6 fL (ref 78.0–100.0)
PLATELETS: 213 10*3/uL (ref 150–400)
RBC: 5.01 MIL/uL (ref 4.22–5.81)
RDW: 12.7 % (ref 11.5–15.5)
WBC: 10.1 10*3/uL (ref 4.0–10.5)

## 2014-09-21 LAB — COMPREHENSIVE METABOLIC PANEL
ALK PHOS: 86 U/L (ref 39–117)
ALT: 20 U/L (ref 0–53)
AST: 14 U/L (ref 0–37)
Albumin: 3.1 g/dL — ABNORMAL LOW (ref 3.5–5.2)
Anion gap: 10 (ref 5–15)
BUN: 30 mg/dL — ABNORMAL HIGH (ref 6–23)
CO2: 27 mEq/L (ref 19–32)
Calcium: 9 mg/dL (ref 8.4–10.5)
Chloride: 100 mEq/L (ref 96–112)
Creatinine, Ser: 1.91 mg/dL — ABNORMAL HIGH (ref 0.50–1.35)
GFR calc non Af Amer: 33 mL/min — ABNORMAL LOW (ref >90)
GFR, EST AFRICAN AMERICAN: 39 mL/min — AB (ref >90)
GLUCOSE: 289 mg/dL — AB (ref 70–99)
POTASSIUM: 5.2 meq/L (ref 3.7–5.3)
Sodium: 137 mEq/L (ref 137–147)
Total Bilirubin: 0.6 mg/dL (ref 0.3–1.2)
Total Protein: 7 g/dL (ref 6.0–8.3)

## 2014-09-21 LAB — GLUCOSE, CAPILLARY
GLUCOSE-CAPILLARY: 334 mg/dL — AB (ref 70–99)
Glucose-Capillary: 263 mg/dL — ABNORMAL HIGH (ref 70–99)
Glucose-Capillary: 440 mg/dL — ABNORMAL HIGH (ref 70–99)
Glucose-Capillary: 285 mg/dL — ABNORMAL HIGH (ref 70–99)

## 2014-09-21 LAB — HEMOGLOBIN A1C
HEMOGLOBIN A1C: 7.3 % — AB (ref <5.7)
Mean Plasma Glucose: 163 mg/dL — ABNORMAL HIGH (ref <117)

## 2014-09-21 LAB — LIPID PANEL
CHOL/HDL RATIO: 2.4 ratio
CHOLESTEROL: 129 mg/dL (ref 0–200)
HDL: 53 mg/dL (ref >39)
LDL Cholesterol: 55 mg/dL (ref 0–99)
Triglycerides: 103 mg/dL (ref <150)
VLDL: 21 mg/dL (ref 0–40)

## 2014-09-21 IMAGING — US US RENAL
1 series · 14 of 25 positions shown · non-contrast
Comparison: CT, [DATE]

CLINICAL DATA: Renal failure

EXAM:
RENAL/URINARY TRACT ULTRASOUND COMPLETE

[Series 1: us renal · 0.23mm/px · 14 of 59 slices shown]
[im 1/59]
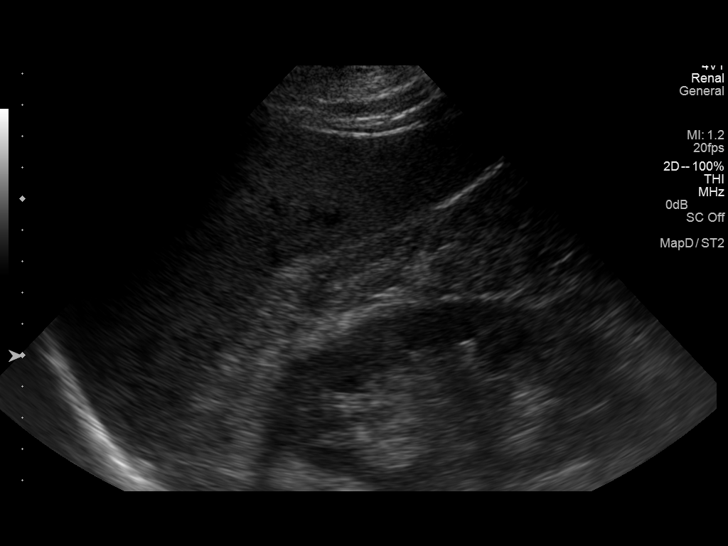
[im 5/59]
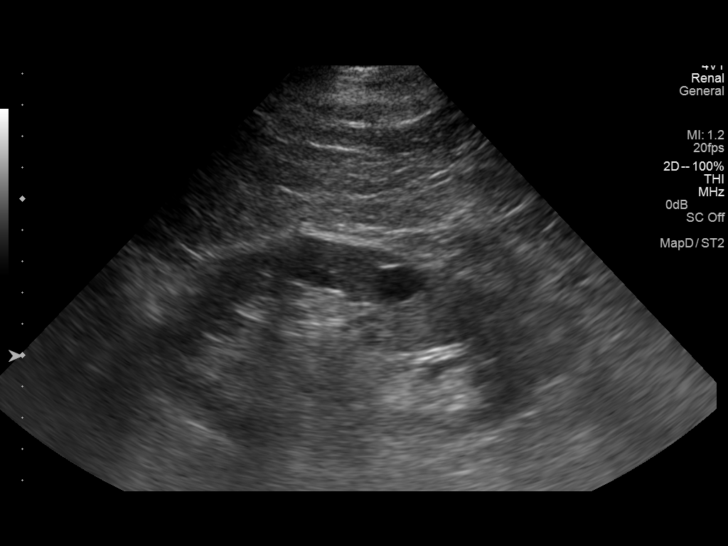
[im 10/59]
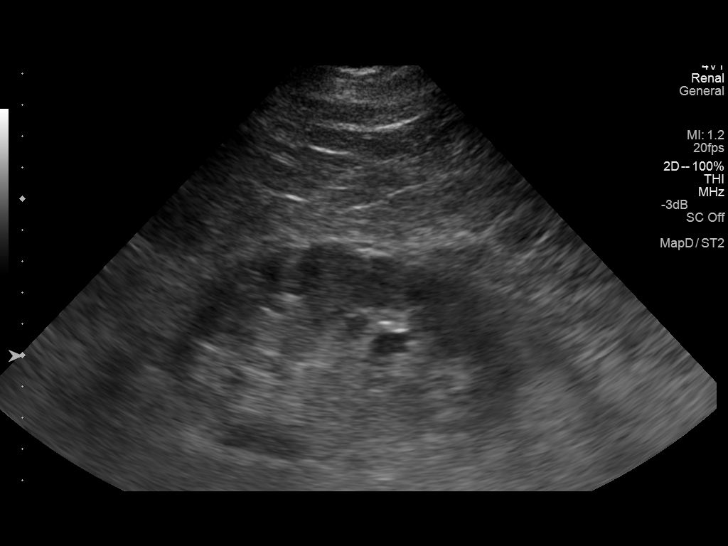
[im 15/59]
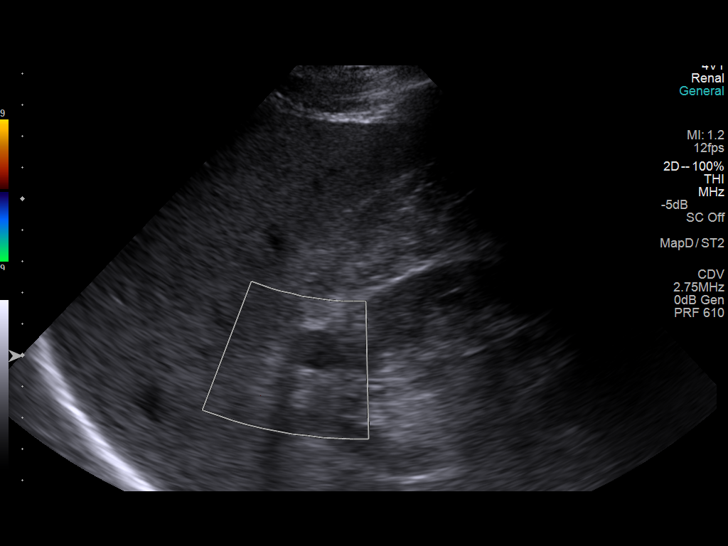
[im 20/59]
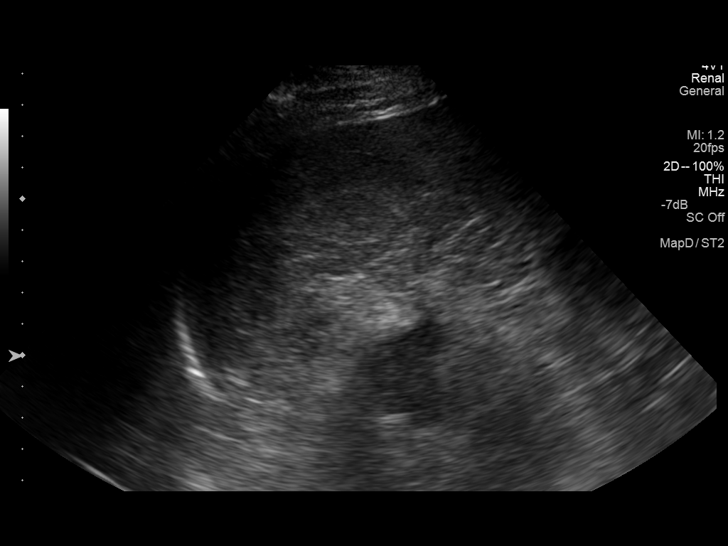
[im 22/59]
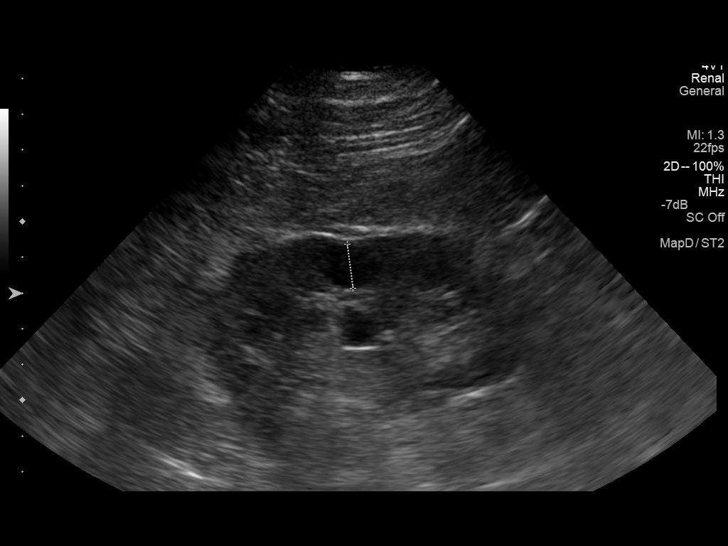
[im 27/59]
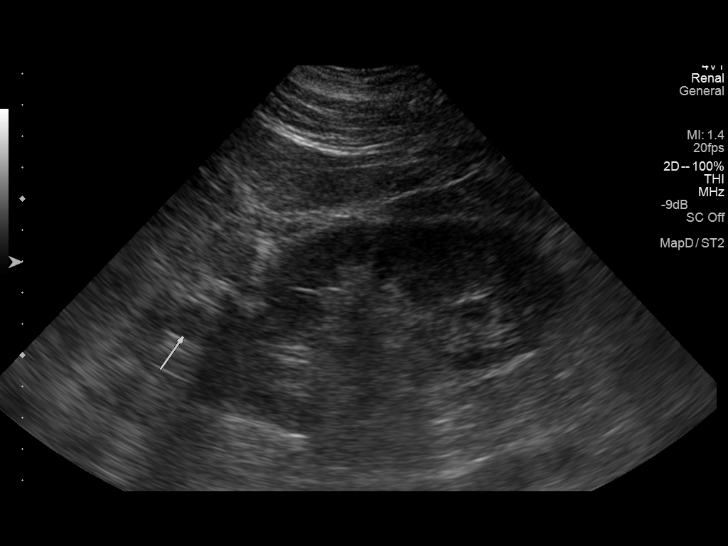
[im 32/59]
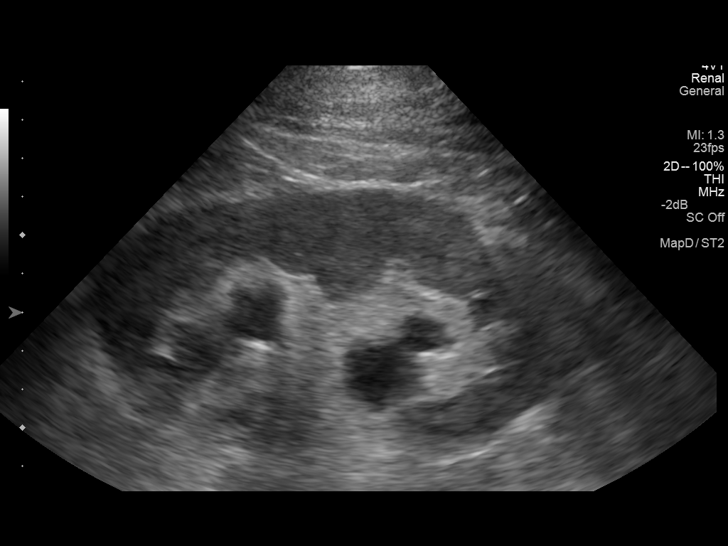
[im 37/59]
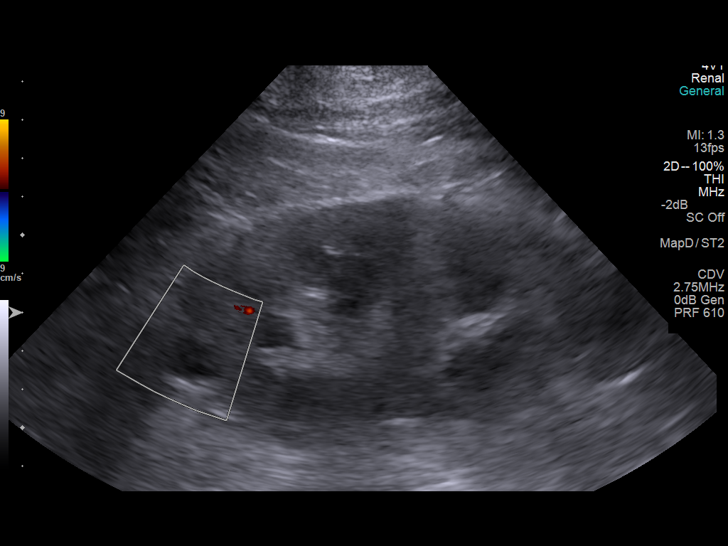
[im 39/59]
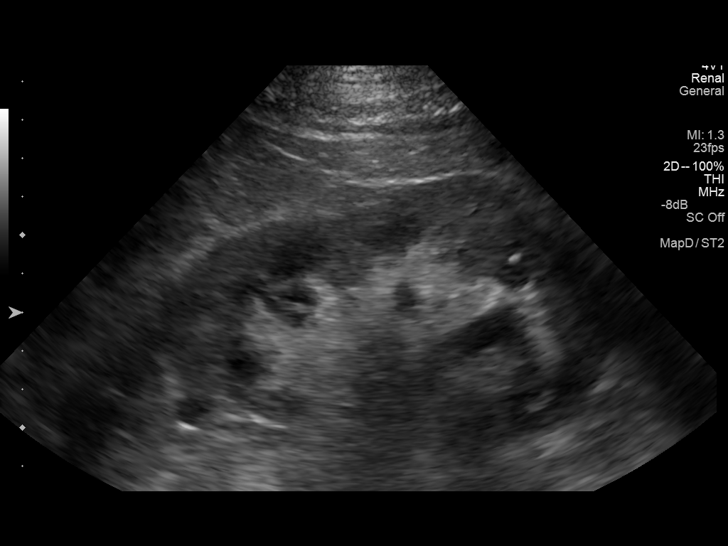
[im 44/59]
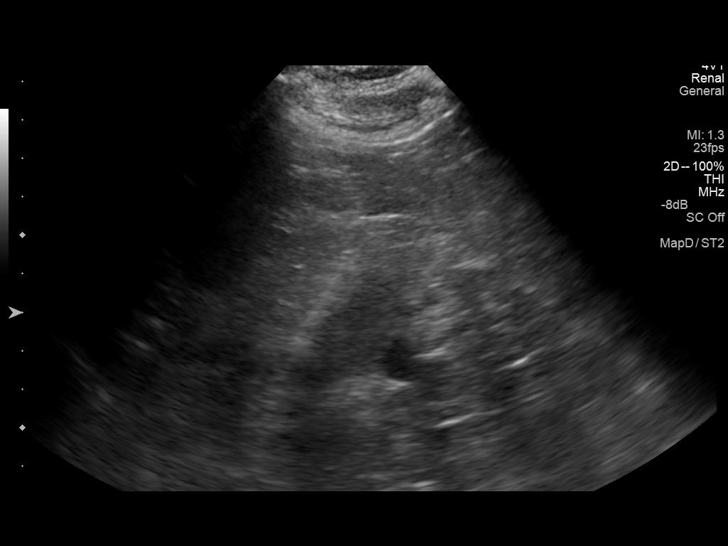
[im 49/59]
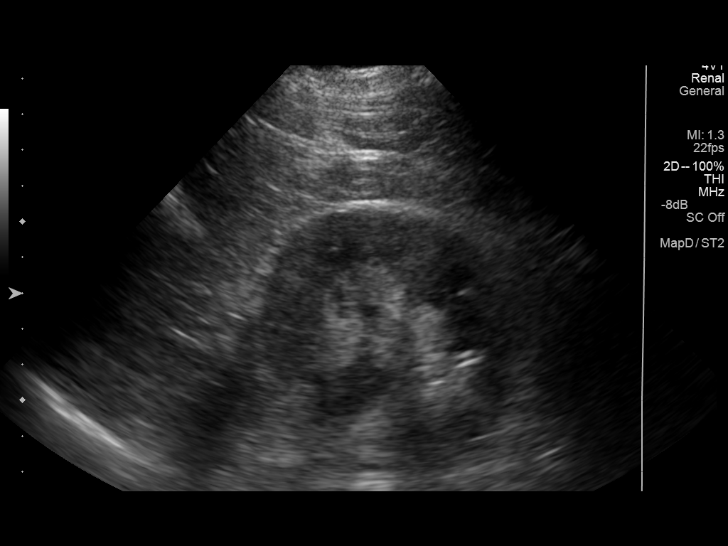
[im 54/59]
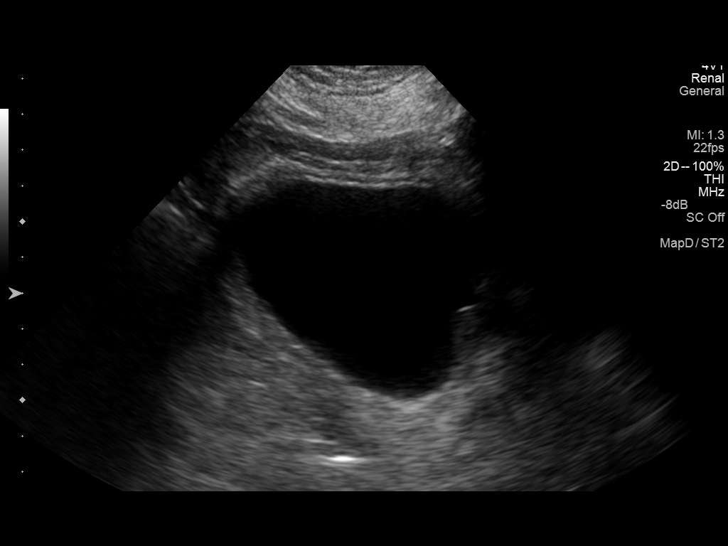
[im 59/59]
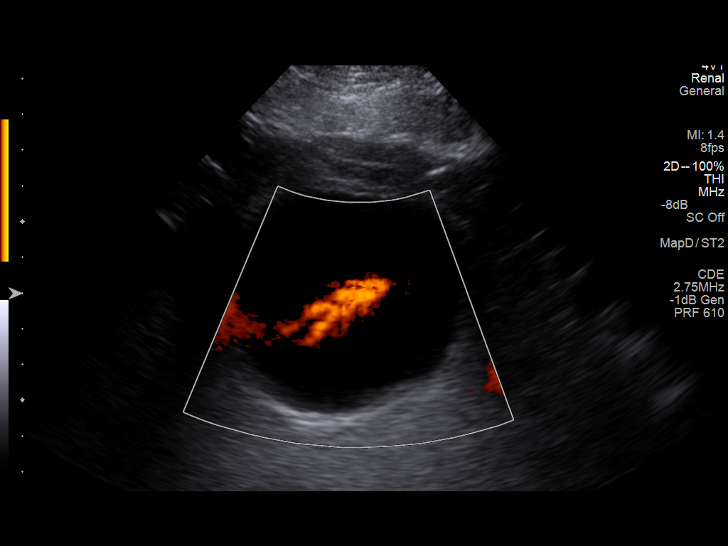

[14 of 25 positions shown; findings below may reference images not displayed]

FINDINGS: Right Kidney:

Length: 11.7 cm. Normal parenchymal echogenicity. Mild renal
cortical thinning. 14 mm midpole cortical cyst. Small calcifications
noted in the midpole. 16 mm exophytic upper pole cyst. Post cysts
appear simple. No other masses. No hydronephrosis.

Left Kidney:

Length: 13.2 cm. Normal parenchymal echogenicity. Mild
hydronephrosis similar to the prior CT. Small exophytic upper pole
simple cyst measuring 14 mm. Small nonobstructing stone in the lower
pole. No solid masses P

Bladder:

Moderately distended. No mass or stone or wall thickening. Normal
right ureteral jet. Left ureteral jet not visualized.
IMPRESSION: 1. Mild left hydronephrosis. This is similar to the hydronephrosis
noted on the prior CT due to a distal left ureteral stone. Lack of a
left ureteral jet in the bladder supports persistence of this
obstructing stone.
2. No right hydronephrosis.
3. Bilateral renal cysts. Bilateral nonobstructing intrarenal
stones.

## 2014-09-21 MED ORDER — INFLUENZA VAC SPLIT QUAD 0.5 ML IM SUSY
0.5000 mL | PREFILLED_SYRINGE | INTRAMUSCULAR | Status: DC
  Filled 2014-09-21: qty 0.5

## 2014-09-21 MED ORDER — LORATADINE 10 MG PO TABS: 10.0000 mg | ORAL_TABLET | Freq: Every day | ORAL | Status: DC | PRN

## 2014-09-21 MED ORDER — INSULIN ASPART 100 UNIT/ML ~~LOC~~ SOLN
0.0000 [IU] | Freq: Three times a day (TID) | SUBCUTANEOUS | Status: DC
  Administered 2014-09-21: 5 [IU] via SUBCUTANEOUS

## 2014-09-21 MED ORDER — ONDANSETRON HCL 4 MG/2ML IJ SOLN: 4.0000 mg | Freq: Four times a day (QID) | INTRAMUSCULAR | Status: DC | PRN

## 2014-09-21 MED ORDER — TAMSULOSIN HCL 0.4 MG PO CAPS
0.4000 mg | ORAL_CAPSULE | Freq: Every day | ORAL | Status: DC
  Administered 2014-09-21 – 2014-09-22 (×2): 0.4 mg via ORAL
  Filled 2014-09-21 (×2): qty 1

## 2014-09-21 MED ORDER — ASPIRIN EC 81 MG PO TBEC
81.0000 mg | DELAYED_RELEASE_TABLET | Freq: Every day | ORAL | Status: DC
  Administered 2014-09-21 – 2014-09-22 (×2): 81 mg via ORAL
  Filled 2014-09-21 (×2): qty 1

## 2014-09-21 MED ORDER — DIPHENHYDRAMINE HCL 50 MG/ML IJ SOLN
25.0000 mg | Freq: Four times a day (QID) | INTRAMUSCULAR | Status: DC
  Administered 2014-09-21 – 2014-09-22 (×6): 25 mg via INTRAVENOUS
  Filled 2014-09-21 (×6): qty 1

## 2014-09-21 MED ORDER — ONDANSETRON HCL 4 MG PO TABS: 4.0000 mg | ORAL_TABLET | Freq: Four times a day (QID) | ORAL | Status: DC | PRN

## 2014-09-21 MED ORDER — METHYLPREDNISOLONE SODIUM SUCC 40 MG IJ SOLR
40.0000 mg | Freq: Two times a day (BID) | INTRAMUSCULAR | Status: DC
  Administered 2014-09-21: 40 mg via INTRAVENOUS
  Filled 2014-09-21 (×2): qty 1

## 2014-09-21 MED ORDER — ACETAMINOPHEN 650 MG RE SUPP: 650.0000 mg | Freq: Four times a day (QID) | RECTAL | Status: DC | PRN

## 2014-09-21 MED ORDER — METHYLPREDNISOLONE SODIUM SUCC 40 MG IJ SOLR: 40.0000 mg | Freq: Two times a day (BID) | INTRAMUSCULAR | Status: DC

## 2014-09-21 MED ORDER — SODIUM CHLORIDE 0.9 % IV SOLN: INTRAVENOUS | Status: DC

## 2014-09-21 MED ORDER — ATORVASTATIN CALCIUM 40 MG PO TABS
40.0000 mg | ORAL_TABLET | Freq: Every day | ORAL | Status: DC
  Administered 2014-09-21 – 2014-09-22 (×2): 40 mg via ORAL
  Filled 2014-09-21 (×2): qty 1

## 2014-09-21 MED ORDER — SODIUM CHLORIDE 0.9 % IV SOLN: 250.0000 mL | INTRAVENOUS | Status: DC | PRN

## 2014-09-21 MED ORDER — SODIUM CHLORIDE 0.9 % IJ SOLN
3.0000 mL | Freq: Two times a day (BID) | INTRAMUSCULAR | Status: DC
  Administered 2014-09-21 – 2014-09-22 (×3): 3 mL via INTRAVENOUS

## 2014-09-21 MED ORDER — ALBUTEROL SULFATE (2.5 MG/3ML) 0.083% IN NEBU
3.0000 mL | INHALATION_SOLUTION | Freq: Four times a day (QID) | RESPIRATORY_TRACT | Status: DC | PRN
  Administered 2014-09-22: 3 mL via RESPIRATORY_TRACT
  Filled 2014-09-21: qty 3

## 2014-09-21 MED ORDER — INSULIN ASPART 100 UNIT/ML ~~LOC~~ SOLN
0.0000 [IU] | Freq: Every day | SUBCUTANEOUS | Status: DC
  Administered 2014-09-21: 4 [IU] via SUBCUTANEOUS

## 2014-09-21 MED ORDER — INSULIN ASPART 100 UNIT/ML ~~LOC~~ SOLN
0.0000 [IU] | Freq: Three times a day (TID) | SUBCUTANEOUS | Status: DC
  Administered 2014-09-21: 8 [IU] via SUBCUTANEOUS
  Administered 2014-09-21: 15 [IU] via SUBCUTANEOUS
  Administered 2014-09-22: 11 [IU] via SUBCUTANEOUS
  Administered 2014-09-22: 8 [IU] via SUBCUTANEOUS

## 2014-09-21 MED ORDER — SODIUM CHLORIDE 0.9 % IJ SOLN: 3.0000 mL | INTRAMUSCULAR | Status: DC | PRN

## 2014-09-21 MED ORDER — BUDESONIDE-FORMOTEROL FUMARATE 80-4.5 MCG/ACT IN AERO
2.0000 | INHALATION_SPRAY | Freq: Two times a day (BID) | RESPIRATORY_TRACT | Status: DC
  Administered 2014-09-21 – 2014-09-22 (×3): 2 via RESPIRATORY_TRACT
  Filled 2014-09-21: qty 6.9

## 2014-09-21 MED ORDER — METHYLPREDNISOLONE SODIUM SUCC 125 MG IJ SOLR
60.0000 mg | Freq: Four times a day (QID) | INTRAMUSCULAR | Status: DC
  Administered 2014-09-21 (×2): 60 mg via INTRAVENOUS
  Filled 2014-09-21 (×2): qty 2

## 2014-09-21 MED ORDER — ACETAMINOPHEN 325 MG PO TABS: 650.0000 mg | ORAL_TABLET | Freq: Four times a day (QID) | ORAL | Status: DC | PRN

## 2014-09-21 MED ORDER — METHYLPREDNISOLONE SODIUM SUCC 40 MG IJ SOLR: 40.0000 mg | Freq: Three times a day (TID) | INTRAMUSCULAR | Status: DC

## 2014-09-21 MED ORDER — SODIUM CHLORIDE 0.9 % IV SOLN
INTRAVENOUS | Status: DC
  Administered 2014-09-21 – 2014-09-22 (×2): via INTRAVENOUS

## 2014-09-21 MED ORDER — ENOXAPARIN SODIUM 40 MG/0.4ML ~~LOC~~ SOLN
40.0000 mg | SUBCUTANEOUS | Status: DC
  Administered 2014-09-21 – 2014-09-22 (×2): 40 mg via SUBCUTANEOUS
  Filled 2014-09-21 (×2): qty 0.4

## 2014-09-21 NOTE — Progress Notes (Signed)
I have directly reviewed the clinical findings, lab, imaging studies and management of this patient in detail. I have interviewed and examined the patient and agree with the documentation,  as recorded by the Physician extender, Ms. Toya Smothers, NP.  This is a 73 year old gentleman with a history of hypertension who has been on ACE inhibitor for the past 6 years who presents emergency department with increased lip swelling.  Angioedema Patient was placed on Solu-Medrol as well as Benadryl. His lip swelling has improved.  His ACE inhibitor was held secondary to angioedema. Will continue to taper off Solu-Medrol and Benadryl.   Acute kidney injury Patient's baseline creatinine is less than 1, however he did receive IV contrast approximately one week ago for CT scan, his creatinine is currently 2.10. Will continue fluids and monitor his BMP. Renal ultrasound showed mild left hydronephrosis, distal left ureteral stone, bilateral nonobstructing intrarenal stones.  Recent diagnosis of kidney stones as well as left hydronephrosis Will resume patient's Flomax.  Will continue to monitor patient's urine output as well as daily weights. She will likely need to follow the urologist outpatient.  Patient was seen examined today. General: Well-developed, well-nourished, no apparent distress Cardiovascular: Normal S1-S2, regular rate and rhythm Respiratory: Clear to auscultation bilaterally Abdomen: Soft, obese, nontender, nondistended, positive bowel sounds  Total patient care time: 30 minutes  Sherra Kimmons D.O. on 09/21/2014 at 12:13 PM  [facility] Office  [phone]

## 2014-09-21 NOTE — Progress Notes (Signed)
[facility] PROGRESS NOTE  Vincent Collins ZOX:[id number] DOB: April 17, 1941 DOA: 09/20/2014 PCP: Rudi Heap, MD  Assessment/Plan: Angioedema   Much improved this am. Less swelling Likely secondary to ACE inhibitor benazepril. Continue to hold benazepril. Will resume flomax and contine Solu-Medrol at lower dose. Continue  Benadryl 25 mg IV q 6 hrs.   AKI   Improving with IV fluids. Most likely related to contrast induced nephropathy. Urine output good. Continue IV fluids. Renal US mild left hydronephrosis. This is similar to the hydronephrosis  noted on the prior CT due to a distal left ureteral stone. Lack of a left ureteral jet in the bladder supports persistence of this obstructing stone.  No right hydronephrosis. Bilateral renal cysts. Bilateral nonobstructing intrarenal stones. Will monitor  Diabetes mellitus   uncontrolled with steroids. CBG range 263.  Continue sliding scale insulin with novolog. Obtain A1c. Hold metformin. Suspect better control as steroids tapered.   Left hydronephrosis/obstructing left ureteral stone Renal US reveals persistance of uretal stone but stable. Patient denies pain. Urine output good. Will need follow up with urologist.  HTN Fair control off anti-hypertensive meds. He also takes norvasc which is on hold as well. Monitor closely.   COPD Appears stable at baseline. Does complain of mile sob. Good air flow. Continue home inhaler and nebs. Oxygen saturation level >90% on room air. Monitor  Anxiety Reports feeling "jittery" likely related to steroids.   Code Status: full code Family Communication: wife at bedside Disposition Plan: home hopefully tomorrow   Consultants:  none  Procedures:  none  Antibiotics:  none  HPI/Subjective: Awake alert. Reports mild sob but states "i think it is my nerves".  Objective: Filed Vitals:   09/21/14 0648  BP: 146/68  Pulse: 69  Temp: 97.5 F (36.4 C)  Resp: 16   No intake or output data in  the 24 hours ending 09/21/14 1041 Filed Weights   09/20/14 1946  Weight: 113.399 kg (250 lb)    Exam:   General:  Awake alert appears comfortable  Cardiovascular: RRR No m/g/r no LE edema PPP  Respiratory: normal effort BS slightly distant but clear bilaterally to ausculation i hear no wheeze or rhonchi  Abdomen: obese soft +BS non-tender to palpation  Musculoskeletal: no clubbing or cyanosis  Head: lips slightly puffy, mucus membranes of mouth pink, moist. No tongue swelling   Data Reviewed: Basic Metabolic Panel:  Recent Labs Lab 09/20/14 2007 09/21/14 0620  NA 136* 137  K 4.3 5.2  CL 95* 100  CO2 28 27  GLUCOSE 140* 289*  BUN 29* 30*  CREATININE 2.10* 1.91*  CALCIUM 9.1 9.0   Liver Function Tests:  Recent Labs Lab 09/20/14 2007 09/21/14 0620  AST 18 14  ALT 23 20  ALKPHOS 96 86  BILITOT 0.7 0.6  PROT 7.2 7.0  ALBUMIN 3.4* 3.1*   No results found for this basename: LIPASE, AMYLASE,  in the last 168 hours No results found for this basename: AMMONIA,  in the last 168 hours CBC:  Recent Labs Lab 09/20/14 2007 09/21/14 0620  WBC 13.4* 10.1  NEUTROABS 9.5*  --   HGB 15.8 15.0  HCT 45.1 43.4  MCV 87.1 86.6  PLT 217 213   Cardiac Enzymes:  Recent Labs Lab 09/20/14 2007  TROPONINI <0.30   BNP (last 3 results) No results found for this basename: PROBNP,  in the last 8760 hours CBG:  Recent Labs Lab 09/21/14 0817  GLUCAP 263*    No results found for  this or any previous visit (from the past 240 hour(s)).   Studies: US Renal  09/21/2014   CLINICAL DATA:  Renal failure  EXAM: RENAL/URINARY TRACT ULTRASOUND COMPLETE  COMPARISON:  CT, 09/10/2014  FINDINGS: Right Kidney:  Length: 11.7 cm. Normal parenchymal echogenicity. Mild renal cortical thinning. 14 mm midpole cortical cyst. Small calcifications noted in the midpole. 16 mm exophytic upper pole cyst. Post cysts appear simple. No other masses. No hydronephrosis.  Left Kidney:  Length: 13.2  cm. Normal parenchymal echogenicity. Mild hydronephrosis similar to the prior CT. Small exophytic upper pole simple cyst measuring 14 mm. Small nonobstructing stone in the lower pole. No solid masses P  Bladder:  Moderately distended. No mass or stone or wall thickening. Normal right ureteral jet. Left ureteral jet not visualized.  IMPRESSION: 1. Mild left hydronephrosis. This is similar to the hydronephrosis noted on the prior CT due to a distal left ureteral stone. Lack of a left ureteral jet in the bladder supports persistence of this obstructing stone. 2. No right hydronephrosis. 3. Bilateral renal cysts. Bilateral nonobstructing intrarenal stones.   Electronically Signed   By: Amie Portland M.D.   On: 09/21/2014 09:46    Scheduled Meds: . aspirin EC  81 mg Oral Daily  . atorvastatin  40 mg Oral Daily  . budesonide-formoterol  2 puff Inhalation BID  . diphenhydrAMINE  25 mg Intravenous Q6H  . enoxaparin (LOVENOX) injection  40 mg Subcutaneous Q24H  . [START ON 09/22/2014] Influenza vac split quadrivalent PF  0.5 mL Intramuscular Tomorrow-1000  . insulin aspart  0-9 Units Subcutaneous TID WC  . methylPREDNISolone (SOLU-MEDROL) injection  60 mg Intravenous Q6H  . sodium chloride  3 mL Intravenous Q12H  . tamsulosin  0.4 mg Oral Daily   Continuous Infusions: . sodium chloride 75 mL/hr at 09/21/14 0045    Principal Problem:   Angioedema Active Problems:   HTN (hypertension)   Anxiety   COPD (chronic obstructive pulmonary disease)   Type 2 diabetes mellitus   Acute kidney injury   Hydronephrosis of left kidney   Left ureteral stone    Time spent: 35 mintues   [facility] M  [facility] Pager [phone]. If 7PM-7AM, please contact night-coverage at [email or web address], password [facility] 09/21/2014, 10:41 AM  LOS: 1 day

## 2014-09-22 DIAGNOSIS — F411 Generalized anxiety disorder: Secondary | ICD-10-CM

## 2014-09-22 LAB — BASIC METABOLIC PANEL
ANION GAP: 14 (ref 5–15)
BUN: 26 mg/dL — AB (ref 6–23)
CALCIUM: 9.1 mg/dL (ref 8.4–10.5)
CHLORIDE: 101 meq/L (ref 96–112)
CO2: 24 meq/L (ref 19–32)
Creatinine, Ser: 1.73 mg/dL — ABNORMAL HIGH (ref 0.50–1.35)
GFR calc Af Amer: 44 mL/min — ABNORMAL LOW (ref >90)
GFR calc non Af Amer: 38 mL/min — ABNORMAL LOW (ref >90)
GLUCOSE: 271 mg/dL — AB (ref 70–99)
Potassium: 4.6 mEq/L (ref 3.7–5.3)
Sodium: 139 mEq/L (ref 137–147)

## 2014-09-22 LAB — CBC
HEMATOCRIT: 41.3 % (ref 39.0–52.0)
HEMOGLOBIN: 14.3 g/dL (ref 13.0–17.0)
MCH: 30 pg (ref 26.0–34.0)
MCHC: 34.6 g/dL (ref 30.0–36.0)
MCV: 86.6 fL (ref 78.0–100.0)
Platelets: 249 10*3/uL (ref 150–400)
RBC: 4.77 MIL/uL (ref 4.22–5.81)
RDW: 12.8 % (ref 11.5–15.5)
WBC: 21.6 10*3/uL — ABNORMAL HIGH (ref 4.0–10.5)

## 2014-09-22 LAB — GLUCOSE, CAPILLARY
GLUCOSE-CAPILLARY: 282 mg/dL — AB (ref 70–99)
Glucose-Capillary: 308 mg/dL — ABNORMAL HIGH (ref 70–99)

## 2014-09-22 MED ORDER — CARVEDILOL 12.5 MG PO TABS
12.5000 mg | ORAL_TABLET | Freq: Two times a day (BID) | ORAL | Status: DC
  Administered 2014-09-22: 12.5 mg via ORAL
  Filled 2014-09-22: qty 1

## 2014-09-22 MED ORDER — DIPHENHYDRAMINE HCL 25 MG PO TABS: 25.0000 mg | ORAL_TABLET | Freq: Four times a day (QID) | ORAL | Status: DC | PRN

## 2014-09-22 MED ORDER — PREDNISONE 20 MG PO TABS
60.0000 mg | ORAL_TABLET | Freq: Once | ORAL | Status: AC
  Administered 2014-09-22: 60 mg via ORAL
  Filled 2014-09-22: qty 3

## 2014-09-22 MED ORDER — FAMOTIDINE 20 MG PO TABS: 20.0000 mg | ORAL_TABLET | Freq: Two times a day (BID) | ORAL | Status: DC

## 2014-09-22 MED ORDER — PREDNISONE 10 MG PO TABS: ORAL_TABLET | ORAL | Status: DC

## 2014-09-22 MED ORDER — GLIMEPIRIDE 2 MG PO TABS: 1.0000 mg | ORAL_TABLET | Freq: Two times a day (BID) | ORAL | Status: DC

## 2014-09-22 MED ORDER — GLIMEPIRIDE 1 MG PO TABS: ORAL_TABLET | ORAL | Status: DC

## 2014-09-22 MED ORDER — CIPROFLOXACIN HCL 500 MG PO TABS: 500.0000 mg | ORAL_TABLET | Freq: Two times a day (BID) | ORAL | Status: DC

## 2014-09-22 MED ORDER — CARVEDILOL 12.5 MG PO TABS: 12.5000 mg | ORAL_TABLET | Freq: Two times a day (BID) | ORAL | Status: DC

## 2014-09-22 NOTE — Care Management Note (Signed)
    Page 1 of 1   09/22/2014     4:31:35 PM CARE MANAGEMENT NOTE 09/22/2014  Patient:  KAWAN, VALLADOLID   Account Number:  [id number]  Date Initiated:  09/22/2014  Documentation initiated by:  Anibal Henderson  Subjective/Objective Assessment:   Pt admitted with angioedema, from unknown source/? medication. pt is from home with spouse, is independent, and will return home at D/C     Action/Plan:   No needs identified   Anticipated DC Date:  09/22/2014   Anticipated DC Plan:  HOME/SELF CARE      DC Planning Services  CM consult      Choice offered to / List presented to:             Status of service:  Completed, signed off Medicare Important Message given?  NA - LOS <3 / Initial given by admissions (If response is "NO", the following Medicare IM given date fields will be blank) Date Medicare IM given:   Medicare IM given by:   Date Additional Medicare IM given:   Additional Medicare IM given by:    Discharge Disposition:  HOME/SELF CARE  Per UR Regulation:  Reviewed for med. necessity/level of care/duration of stay  If discussed at Long Length of Stay Meetings, dates discussed:    Comments:  09/22/14 1630 Anibal Henderson RN/CM

## 2014-09-22 NOTE — Progress Notes (Signed)
Pt is not sure if he passed his kidney stone or not when he went to the restroom a little bit ago but he said it was painful urinating this time where it hasn't been before now.

## 2014-09-22 NOTE — Discharge Summary (Signed)
Physician Discharge Summary  Vincent Collins UXL:[id number] DOB: 1941/11/10 DOA: 09/20/2014  PCP: Rudi Heap, MD  Admit date: 09/20/2014 Discharge date: 09/22/2014  Time spent: 40 minutes  Recommendations for Outpatient Follow-up:  1. Appointment with PCP 9/29 for evaluation of resolution of angioedema and DM control while on steroid taper, monitoring of BP as anti-hypertensive medications changed and evaluation of kidney function and follow up on resuming metformin. Discharged on amaryl while AKI resolves. Recommend BMET to evaluate kidney function. 2. Currently has appointment for surgery with Dr Kathrynn Running on October 14 with follow up on October 16. Dr Emmaline Life office notified of hospitalization. His office will contact patient if surgery needs to be re-scheduled.   Discharge Diagnoses:  Principal Problem:   Angioedema Active Problems:   HTN (hypertension)   Anxiety   COPD (chronic obstructive pulmonary disease)   Type 2 diabetes mellitus   Acute kidney injury   Hydronephrosis of left kidney   Left ureteral stone   Discharge Condition: stable  Diet recommendation: carb modified heart healthy  Filed Weights   09/20/14 1946  Weight: 113.399 kg (250 lb)    History of present illness:  73 year old male who has a past medical history of Hypertension; Hyperlipidemia; COPD (chronic obstructive pulmonary disease); Diverticulitis; and Diabetes mellitus without complication.  presented to the ED on 09/20/14 after patient started having swelling of the upper and lower lip since 7 am. Patient said the previous one week he had been having itching on the left side of the face and nose, and that morning he started having swelling of the upper and lower lip. The itching got better but the swelling persisted. He denied shortness of breath or difficulty swallowing. No tongue swelling. Patient takes benazepril for hypertension, he was recently started on Flomax for BPH. Patient also had been getting  Percocet when necessary for pain for the kidney stones.   Patient was seen in the ED on 09/10/2014 for kidney stone, CT scan of the abdomen was done which showed of Dr. 4 mm stone within the distal left ureter with secondary moderate left lateral ureterohydro nephrosis.   At that time patient also received IV contrast, on admission patient creatinine was 2.10 his baseline creatinine is around 1.   He denied chest pain, no dysuria urgency or frequency of urination. Denied fever   Hospital Course:  Angioedema  Admitted and provided with steroids, benadryl and pepcid. He quickly improved. At discharge no swelling around lips and no complaints of itching. suspect secondary to ACE inhibitor benazepril so this has been discontinued.  He will be discharged with steroid taper and prn benadryl and pepcid.    AKI  Most likely related to contrast induced nephropathy in setting of long standing ACE inhibitor use. Provided with IV fluids and nephrotoxins held.  Improving at discharge.  Urine output remained good. Renal US mild left hydronephrosis. This is similar to the hydronephrosis  noted on the prior CT due to a distal left ureteral stone. Lack of a left ureteral jet in the bladder supports persistence of this obstructing stone. No right hydronephrosis. Bilateral renal cysts. Bilateral nonobstructing intrarenal stones. Recommend BMET 09/26/14 at PCP appointment to trend kidney function.   Diabetes mellitus  uncontrolled with steroids. Provided with sliding scale insulin with novolog during hosptialization. A1c 7.3. His metformin was held due to #2. Will discharge with Amaryl. Suspect better control as steroids tapered and hopefully will be able to switch back to metformin once AKI resolved. Follow up with PCP 09/26/14  as above   Left hydronephrosis/obstructing left ureteral stone  Renal US reveals persistance of uretal stone but stable. Patient denied pain. Urine output remained good. Dr Emmaline Life office  notified of hospitalization. Patient has surgery scheduled 10/14. Dr Emmaline Life office will contact patient if surgery needs to be re-scheduled   HTN  Fair control off anti-hypertensive meds during hospitalization. Ace inhibitor and norvasc discontinued due to #1. Being discharged on coreg BID. Will need OP monitoring for optimal BP control.    COPD  remained stable at baseline.  Anxiety  Reports feeling "jittery" likely related to steroids.    Procedures:  none  Consultations:  none  Discharge Exam: Filed Vitals:   09/22/14 0535  BP: 154/71  Pulse: 73  Temp: 97.5 F (36.4 C)  Resp: 14    General: awake alert appears comfortable Cardiovascular: RRR +murmur no gallup or rub No LE edema Respiratory: normal effort BS clear bilaterally no wheeze Head: no swelling to lips or tongue. PERRL EOMI   Discharge Instructions You were cared for by a hospitalist during your hospital stay. If you have any questions about your discharge medications or the care you received while you were in the hospital after you are discharged, you can call the unit and asked to speak with the hospitalist on call if the hospitalist that took care of you is not available. Once you are discharged, your primary care physician will handle any further medical issues. Please note that NO REFILLS for any discharge medications will be authorized once you are discharged, as it is imperative that you return to your primary care physician (or establish a relationship with a primary care physician if you do not have one) for your aftercare needs so that they can reassess your need for medications and monitor your lab values.   Current Discharge Medication List    START taking these medications   Details  carvedilol (COREG) 12.5 MG tablet Take 1 tablet (12.5 mg total) by mouth 2 (two) times daily with a meal. Qty: 60 tablet, Refills: 0    diphenhydrAMINE (BENADRYL) 25 MG tablet Take 1 tablet (25 mg total) by mouth  every 6 (six) hours as needed for itching. Qty: 20 tablet, Refills: 0    famotidine (PEPCID) 20 MG tablet Take 1 tablet (20 mg total) by mouth 2 (two) times daily. Qty: 10 tablet, Refills: 0    glimepiride (AMARYL) 1 MG tablet Take 1 tab twice daily in am and evening for 3 days, then take 1 tab daily in am. Qty: 15 tablet, Refills: 0    predniSONE (DELTASONE) 10 MG tablet Take 5 tabs on 5/26 then take 4 tabs on 5/27 then take 3 tabs on 5/28 then take 2 tabs on 5/29 then take 1 tab on 5/30 then stop. Qty: 15 tablet, Refills: 0      CONTINUE these medications which have NOT CHANGED   Details  aspirin 81 MG EC tablet Take 81 mg by mouth daily.      atorvastatin (LIPITOR) 40 MG tablet Take 40 mg by mouth daily.    budesonide-formoterol (SYMBICORT) 80-4.5 MCG/ACT inhaler Inhale 2 puffs into the lungs 2 (two) times daily. Qty: 1 Inhaler, Refills: 3    Cholecalciferol (VITAMIN D) 1000 UNITS capsule Take 1,000 Units by mouth 2 (two) times daily.     fish oil-omega-3 fatty acids 1000 MG capsule Take 1-2 g by mouth 2 (two) times daily. Patient takes 2g in the morning and 1g at night    HYDROcodone-acetaminophen (  NORCO/VICODIN) 5-325 MG per tablet Take 1 tablet by mouth every 6 (six) hours as needed for moderate pain. Qty: 30 tablet, Refills: 0    loratadine (CLARITIN) 10 MG tablet Take 10 mg by mouth daily as needed for allergies.     ondansetron (ZOFRAN) 4 MG tablet Take 4 mg by mouth every 6 (six) hours as needed for nausea or vomiting.    oxyCODONE-acetaminophen (PERCOCET/ROXICET) 5-325 MG per tablet Take 1 tablet by mouth every 6 (six) hours as needed. Qty: 30 tablet, Refills: 0   Associated Diagnoses: Kidney stone    tamsulosin (FLOMAX) 0.4 MG CAPS capsule Take 1 capsule (0.4 mg total) by mouth daily. Qty: 5 capsule, Refills: 0    albuterol (PROAIR HFA) 108 (90 BASE) MCG/ACT inhaler Inhale 2 puffs into the lungs every 6 (six) hours as needed for wheezing.    albuterol (PROVENTIL)  (2.5 MG/3ML) 0.083% nebulizer solution Take 2.5 mg by nebulization 4 (four) times daily as needed for wheezing or shortness of breath.      STOP taking these medications     amLODipine (NORVASC) 5 MG tablet      benazepril (LOTENSIN) 20 MG tablet      metFORMIN (GLUCOPHAGE) 500 MG tablet      ciprofloxacin (CIPRO) 500 MG tablet        Allergies  Allergen Reactions  . Diphenhydramine Hcl Other (See Comments)    Elevates heart rate   Follow-up Information   Follow up with Junie Spencer, FNP On 09/26/2014. (appointment at 9:30. recommend BMET to evaluate kidney function )    Specialty:  Nurse Practitioner   Contact information:   [address] [place] [address] [phone]       Follow up with [name], MD On 10/11/2014. (has appointment for surgery at 2:30)    Specialty:  Urology   Contact information:   [address] [place] [address] [phone]       Follow up with [name], MD On 10/23/2014. (appointment at 11:15)    Specialty:  Urology   Contact information:   [address] [place] [address] [phone]        The results of significant diagnostics from this hospitalization (including imaging, microbiology, ancillary and laboratory) are listed below for reference.    Significant Diagnostic Studies: Ct Abdomen Pelvis W Contrast  09/10/2014   CLINICAL DATA:  Lower abdominal pain, nausea.  EXAM: CT ABDOMEN AND PELVIS WITH CONTRAST  TECHNIQUE: Multidetector CT imaging of the abdomen and pelvis was performed using the standard protocol following bolus administration of intravenous contrast.  CONTRAST:  80mL OMNIPAQUE IOHEXOL 300 MG/ML SOLN, 50mL OMNIPAQUE IOHEXOL 300 MG/ML SOLN  COMPARISON:  Prior CT from 03/27/2009  FINDINGS: Mild subsegmental atelectasis seen dependently within the lung bases. No pleural or pericardial effusion.  1 cm cyst present within the left hepatic lobe. Additional subcentimeter hypodensity within the right  hepatic lobe is too small the characterize, but may represent additional small cyst. The liver is otherwise unremarkable. Gallbladder within normal limits. No biliary dilatation. The spleen, adrenal glands, and pancreas demonstrate a normal contrast enhanced appearance.  1.4 cm cyst present within the right kidney. Additional tiny subcentimeter hypodensities too small the characterize, but statistically likely represents an additional small cyst. No nephrolithiasis or hydronephrosis. No right-sided focal enhancing renal mass.  On the left, there is moderate left hydroureteronephrosis with mild left perinephric fat stranding. There is an obstructive 4 mm calculus within the distal left ureter  just proximal to the left UVJ. No other stones seen within the left kidney or along the course of the left renal collecting system. No focal enhancing left renal mass. Subcentimeter exophytic cyst noted extending posteriorly from the left kidney.  Stomach within normal limits. No evidence of bowel obstruction. No abnormal wall thickening, mucosal enhancement, or inflammatory fat stranding seen about the bowels. Sigmoid diverticulosis present without acute diverticulitis.  Bladder is unremarkable. Prostate is enlarged measuring 6.4 cm in transverse diameter with median lobe hypertrophy, invaginating upon the base of the bladder.  Fat containing paraumbilical hernia present without associated inflammatory changes.  No free air or fluid. No adenopathy. Moderate atherosclerotic calcifications present within the intra-abdominal aorta without aneurysm.  No acute osseous abnormality. No worrisome lytic or blastic osseous lesions.  IMPRESSION: 1. Obstructive 4 mm stone within the distal left ureter with secondary moderate left hydroureteronephrosis. 2. Sigmoid diverticulosis without acute diverticulitis. 3. Small fat containing paraumbilical hernia. 4. Enlarged prostate.   Electronically Signed   By: Rise Mu M.D.   On:  09/10/2014 22:50   US Renal  09/21/2014   CLINICAL DATA:  Renal failure  EXAM: RENAL/URINARY TRACT ULTRASOUND COMPLETE  COMPARISON:  CT, 09/10/2014  FINDINGS: Right Kidney:  Length: 11.7 cm. Normal parenchymal echogenicity. Mild renal cortical thinning. 14 mm midpole cortical cyst. Small calcifications noted in the midpole. 16 mm exophytic upper pole cyst. Post cysts appear simple. No other masses. No hydronephrosis.  Left Kidney:  Length: 13.2 cm. Normal parenchymal echogenicity. Mild hydronephrosis similar to the prior CT. Small exophytic upper pole simple cyst measuring 14 mm. Small nonobstructing stone in the lower pole. No solid masses P  Bladder:  Moderately distended. No mass or stone or wall thickening. Normal right ureteral jet. Left ureteral jet not visualized.  IMPRESSION: 1. Mild left hydronephrosis. This is similar to the hydronephrosis noted on the prior CT due to a distal left ureteral stone. Lack of a left ureteral jet in the bladder supports persistence of this obstructing stone. 2. No right hydronephrosis. 3. Bilateral renal cysts. Bilateral nonobstructing intrarenal stones.   Electronically Signed   By: Amie Portland M.D.   On: 09/21/2014 09:46    Microbiology: No results found for this or any previous visit (from the past 240 hour(s)).   Labs: Basic Metabolic Panel:  Recent Labs Lab 09/20/14 2007 09/21/14 0620 09/22/14 0615  NA 136* 137 139  K 4.3 5.2 4.6  CL 95* 100 101  CO2 GLUCOSE 140* 289* 271*  BUN 29* 30* 26*  CREATININE 2.10* 1.91* 1.73*  CALCIUM 9.1 9.0 9.1   Liver Function Tests:  Recent Labs Lab 09/20/14 2007 09/21/14 0620  AST 18 14  ALT 23 20  ALKPHOS 96 86  BILITOT 0.7 0.6  PROT 7.2 7.0  ALBUMIN 3.4* 3.1*   No results found for this basename: LIPASE, AMYLASE,  in the last 168 hours No results found for this basename: AMMONIA,  in the last 168 hours CBC:  Recent Labs Lab 09/20/14 2007 09/21/14 0620 09/22/14 0615  WBC 13.4* 10.1  21.6*  NEUTROABS 9.5*  --   --   HGB 15.8 15.0 14.3  HCT 45.1 43.4 41.3  MCV 87.1 86.6 86.6  PLT 217 213 249   Cardiac Enzymes:  Recent Labs Lab 09/20/14 2007  TROPONINI <0.30   BNP: BNP (last 3 results) No results found for this basename: PROBNP,  in the last 8760 hours CBG:  Recent Labs Lab 09/21/14 0817 09/21/14  1122 09/21/14 1653 09/21/14 2048 09/22/14 0716  GLUCAP 263* 440* 285* 334* 282*       Signed:  Sandar Krinke M  [facility] 09/22/2014, 10:26 AM

## 2014-09-22 NOTE — Discharge Summary (Addendum)
I have directly reviewed the clinical findings, lab, imaging studies and management of this patient in detail. I have interviewed and examined the patient and agree with the documentation, as recorded by the Physician extender, Ms. Toya Smothers, NP.   This is a 73 year old gentleman with a history of hypertension who has been on ACE inhibitor for the past 6 years who presents emergency department with increased lip swelling. Patient was made for angioedema which is thought to be secondary to his ACE inhibitor. He was placed on Solu-Medrol as well as Benadryl his swelling did improve drastically. Patient will be discharged with a prednisone taper.  Patient was also noted to have acute kidney injury which is likely secondary to contrast-induced' versus a combination of his metformin as well as ACE inhibitor. Patient's metformin and ACE inhibitor were held, patient was given normal saline. His creatinine has trended downward however not back to his baseline. Recommend patient have repeat BMP within one week of hospital discharge. Patient was changed to Amaryl do to his acute kidney injury. Patient should discuss his medication regimen with his primary care physician. He should followup with primary care physician within one week of discharge. Patient is also to follow up with Dr. Berneice Heinrich, urologist, for his kidney stone. He should also continue Flomax.  Patient was seen examined on day of discharge. General: Well-developed, well-nourished, no apparent distress  Cardiovascular: Normal S1-S2, regular rate and rhythm, 3/6 SEM  Respiratory: Clear to auscultation bilaterally  Abdomen: Soft, obese, nontender, nondistended, positive bowel sounds  Time spent: Greater than 30 minutes  Albena Comes D.O. [facility] Pager [phone]  If 7PM-7AM, please contact night-coverage [email or web address] Password [facility] 09/22/2014, 12:46 PM

## 2014-09-22 NOTE — Progress Notes (Addendum)
AVS reviewed with patient. Verbalized understanding of discharge instructions, medications, and physician follow up.Patient refuesed flu shot and states he will get it at his doctors office.   Prescriptions given to patient. Patient refused wheelchair and  accompanied to main entrance for discharge by nurse. Stable at the time of discharge.   IV removed and site within normal limits, no change.

## 2014-09-22 NOTE — Progress Notes (Signed)
Inpatient Diabetes Program Recommendations  AACE/ADA: New Consensus Statement on Inpatient Glycemic Control (2013)  Target Ranges:  Prepandial:   less than 140 mg/dL      Peak postprandial:   less than 180 mg/dL (1-2 hours)      Critically ill patients:  140 - 180 mg/dL   Results for NEERAJ, HOUSAND (MRN [id number]) as of 09/22/2014 08:51  Ref. Range 09/21/2014 08:17 09/21/2014 11:22 09/21/2014 16:53 09/21/2014 20:48 09/22/2014 07:16  Glucose-Capillary Latest Range: 70-99 mg/dL 409 (H) 811 (H) 914 (H) 334 (H) 282 (H)   Diabetes history: DM2 Outpatient Diabetes medications: Metformin 500 mg every evening Current orders for Inpatient glycemic control: Novolog 0-15 units AC, Novolog 0-5 units HS  Inpatient Diabetes Program Recommendations Insulin - Basal: If steroids are continued, please consider ordering low dose basal insulin. Recommend starting with Levemir 10 units daily starting now. Insulin - Meal Coverage: If steroids are continued, please consider ordering Novolog 5 units TID with meals for meal coverage.  Thanks, Orlando Penner, RN, MSN, CCRN Diabetes Coordinator Inpatient Diabetes Program [phone] (Team Pager) [phone] ([place] office) [phone] [facility] office)

## 2014-09-22 NOTE — Discharge Instructions (Signed)
Angioedema Stop taking your benazipril, amlodipine, and flomax.  Return to the ED if you develop difficulty breathing, difficulty swallowing, tongue swelling, lips swelling, or any other concerns. Angioedema is a sudden swelling of tissues, often of the skin. It can occur on the face or genitals or in the abdomen or other body parts. The swelling usually develops over a short period and gets better in 24 to 48 hours. It often begins during the night and is found when the person wakes up. The person may also get red, itchy patches of skin (hives). Angioedema can be dangerous if it involves swelling of the air passages.  Depending on the cause, episodes of angioedema may only happen once, come back in unpredictable patterns, or repeat for several years and then gradually fade away.  CAUSES  Angioedema can be caused by an allergic reaction to various triggers. It can also result from nonallergic causes, including reactions to drugs, immune system disorders, viral infections, or an abnormal gene that is passed to you from your parents (hereditary). For some people with angioedema, the cause is unknown.  Some things that can trigger angioedema include:   Foods.   Medicines, such as ACE inhibitors, ARBs, nonsteroidal anti-inflammatory agents, or estrogen.   Latex.   Animal saliva.   Insect stings.   Dyes used in X-rays.   Mild injury.   Dental work.  Surgery.  Stress.   Sudden changes in temperature.   Exercise. SIGNS AND SYMPTOMS   Swelling of the skin.  Hives. If these are present, there is also intense itching.  Redness in the affected area.   Pain in the affected area.  Swollen lips or tongue.  Breathing problems. This may happen if the air passages swell.  Wheezing. If internal organs are involved, there may be:   Nausea.   Abdominal pain.   Vomiting.   Difficulty swallowing.   Difficulty passing urine. DIAGNOSIS   Your health care provider will  examine the affected area and take a medical and family history.  Various tests may be done to help determine the cause. Tests may include:  Allergy skin tests to see if the problem is an allergic reaction.   Blood tests to check for hereditary angioedema.   Tests to check for underlying diseases that could cause the condition.   A review of your medicines, including over-the-counter medicines, may be done. TREATMENT  Treatment will depend on the cause of the angioedema. Possible treatments include:   Removal of anything that triggered the condition (such as stopping certain medicines).   Medicines to treat symptoms or prevent attacks. Medicines given may include:   Antihistamines.   Epinephrine injection.   Steroids.   Hospitalization may be required for severe attacks. If the air passages are affected, it can be an emergency. Tubes may need to be placed to keep the airway open. HOME CARE INSTRUCTIONS   Take all medicines as directed by your health care provider.  If you were given medicines for emergency allergy treatment, always carry them with you.  Wear a medical bracelet as directed by your health care provider.   Avoid known triggers. SEEK MEDICAL CARE IF:   You have repeat attacks of angioedema.   Your attacks are more frequent or more severe despite preventive measures.   You have hereditary angioedema and are considering having children. It is important to discuss with your health care provider the risks of passing the condition on to your children. SEEK IMMEDIATE MEDICAL CARE IF:  You have severe swelling of the mouth, tongue, or lips.  You have difficulty breathing.   You have difficulty swallowing.   You faint. MAKE SURE YOU:  Understand these instructions.  Will watch your condition.  Will get help right away if you are not doing well or get worse. Document Released: 02/23/2002 Document Revised: 05/01/2014 Document Reviewed:  08/08/2013 [facility] Patient Information 2015 [place], [place]. This information is not intended to replace advice given to you by your health care provider. Make sure you discuss any questions you have with your health care provider.  Document CBG twice daily. Take documentation to PCP appointment on 09/26/14 for evaluation of blood sugar during steroid taper Do not take amaryl if CBG less than 120.  Discuss with PCP when to stop amaryl and resume metformin based on kidney function

## 2014-09-22 NOTE — Progress Notes (Signed)
Spoke with patient about diabetes and home regimen for diabetes control. Patient reports that he is followed by his PCP (Dr. Christell Constant) for diabetes management and currently he takes Metformin 1000 mg every evening as an outpatient for diabetes control. Explained that the doctor wants him to stop the Metformin at this time due to his renal function and will start him on Amaryl for diabetes control. According to the plan, patient will take Amaryl BID for first couple of days and then decrease to once a day as steroids are tapered. Discussed Amaryl and how it works and informed of potential for hypoglycemia with Amaryl. Patient reports that he has a glucometer and he checks his glucose 1-2 times per day. Inquired about normal glucose values and patient reports that he was told his glucose needs to be less than 180 mg/dl.  Explained normal glucose values, hyperglycemia, and hypoglycemia along with treatment for both. Explained 15-15 rule for hypoglycemic treatment. Asked that patient check his glucose 2-4 times per day and to call his PCP if his glucose starts running low (less than 70 mg/dl). Inquired about knowledge about A1C and patient reports that he does not know what an A1C is. Discussed A1C results (7.3% on 09/21/14) and explained what an A1C is and mportance of checking CBGs and maintaining good CBG control to prevent long-term and short-term complications. Discussed impact of nutrition, exercise, stress, sickness, and medications on diabetes control.  Patient verbalized understanding of information discussed and he states that he has no further questions at this time related to diabetes.  In talking with the patient, he asked if he could get a note from the doctor for work to be able to go back on Monday, September 28.  Informed Toya Smothers, NP of patient request.   Thanks, Orlando Penner, RN, MSN, CCRN Diabetes Coordinator Inpatient Diabetes Program [phone] (Team Pager) [phone] ([place]  office) [phone] [facility] office)

## 2014-09-26 ENCOUNTER — Ambulatory Visit (INDEPENDENT_AMBULATORY_CARE_PROVIDER_SITE_OTHER): Payer: Medicare Other | Admitting: Family

## 2014-09-26 ENCOUNTER — Ambulatory Visit: Payer: Medicare Other | Admitting: Family

## 2014-09-26 ENCOUNTER — Encounter: Payer: Self-pay | Admitting: Family

## 2014-09-26 VITALS — BP 155/81 | HR 68 | Temp 97.3°F | Ht 68.0 in | Wt 247.8 lb

## 2014-09-26 DIAGNOSIS — I10 Essential (primary) hypertension: Secondary | ICD-10-CM

## 2014-09-26 DIAGNOSIS — Z09 Encounter for follow-up examination after completed treatment for conditions other than malignant neoplasm: Secondary | ICD-10-CM

## 2014-09-26 MED ORDER — METOPROLOL SUCCINATE ER 50 MG PO TB24: 50.0000 mg | ORAL_TABLET | Freq: Every day | ORAL | Status: DC

## 2014-09-26 NOTE — Progress Notes (Signed)
   Subjective:    Patient ID: Vincent Collins, male    DOB: 08/31/1941, 73 y.o.   MRN: [id number]  HPI Pt presents to the office today for hospital follow-up. Pt was seen in ED for angioedema r/t to ACE inhibitor and acute kidney injury r/t to contrast vs metformin and ACE? Pt was taken off metformin and placed on amaryl. Pt's ACE was d/c and pt was told to follow-up with his PCP. Pt states he has not had any edema since stopping his ACE. Pt states he is still having pain r/t kidney stone and he is following up with his urologists.    Review of Systems  Constitutional: Negative.   HENT: Negative.   Respiratory: Negative.   Cardiovascular: Negative.   Gastrointestinal: Negative.   Endocrine: Negative.   Genitourinary: Negative.   Musculoskeletal: Negative.   Neurological: Negative.   Hematological: Negative.   Psychiatric/Behavioral: Negative.   All other systems reviewed and are negative.      Objective:   Physical Exam  Vitals reviewed. Constitutional: He is oriented to person, place, and time. He appears well-developed and well-nourished. No distress.  HENT:  Head: Normocephalic.  Right Ear: External ear normal.  Left Ear: External ear normal.  Nose: Nose normal.  Mouth/Throat: Oropharynx is clear and moist.  Eyes: Pupils are equal, round, and reactive to light. Right eye exhibits no discharge. Left eye exhibits no discharge.  Neck: Normal range of motion. Neck supple. No thyromegaly present.  Cardiovascular: Normal rate, regular rhythm, normal heart sounds and intact distal pulses.   No murmur heard. Pulmonary/Chest: Effort normal and breath sounds normal. No respiratory distress. He has no wheezes.  Abdominal: Soft. Bowel sounds are normal. He exhibits no distension. There is no tenderness.  Musculoskeletal: Normal range of motion. He exhibits no edema and no tenderness.  Neurological: He is alert and oriented to person, place, and time. He has normal reflexes. No cranial  nerve deficit.  Skin: Skin is warm and dry. No rash noted. No erythema.  Psychiatric: He has a normal mood and affect. His behavior is normal. Judgment and thought content normal.    BP 155/81  Pulse 68  Temp(Src) 97.3 F (36.3 C) (Oral)  Ht 5' 8" (1.727 m)  Wt 247 lb 12.8 oz (112.401 kg)  BMI 37.69 kg/m2       Assessment & Plan:  1. Hospital discharge follow-up - BMP8+EGFR - metoprolol succinate (TOPROL-XL) 50 MG 24 hr tablet; Take 1 tablet (50 mg total) by mouth daily. Take with or immediately following a meal.  Dispense: 90 tablet; Refill: 3  2. Essential hypertension -Pt started on metoprolol 50 mg today -Pt to RTO in 2 weeks and have nurse recheck BP - Pt to keep chronic follow-up visit - BMP8+EGFR - metoprolol succinate (TOPROL-XL) 50 MG 24 hr tablet; Take 1 tablet (50 mg total) by mouth daily. Take with or immediately following a meal.  Dispense: 90 tablet; Refill: 3  Christy Hawks, FNP   

## 2014-09-26 NOTE — Patient Instructions (Signed)

## 2014-09-27 LAB — BMP8+EGFR
BUN / CREAT RATIO: 23 — AB (ref 10–22)
BUN: 28 mg/dL — ABNORMAL HIGH (ref 8–27)
CO2: 22 mmol/L (ref 18–29)
Calcium: 9 mg/dL (ref 8.6–10.2)
Chloride: 97 mmol/L (ref 97–108)
Creatinine, Ser: 1.21 mg/dL (ref 0.76–1.27)
GFR calc Af Amer: 69 mL/min/{1.73_m2} (ref >59)
GFR, EST NON AFRICAN AMERICAN: 59 mL/min/{1.73_m2} — AB (ref >59)
Glucose: 236 mg/dL — ABNORMAL HIGH (ref 65–99)
POTASSIUM: 4.4 mmol/L (ref 3.5–5.2)
SODIUM: 139 mmol/L (ref 134–144)

## 2014-09-29 ENCOUNTER — Telehealth: Payer: Self-pay | Admitting: Family Medicine

## 2014-09-29 ENCOUNTER — Other Ambulatory Visit: Payer: Self-pay | Admitting: Family

## 2014-09-29 NOTE — Telephone Encounter (Signed)
Message copied by Azalee CourseFULP, ASHLEY on Fri Sep 29, 2014  9:58 AM ------      Message from: VinaHAWKS, [place] A      Created: Fri Sep 29, 2014  8:59 AM       Do not take medication if makes SOB. Pt needs to follow-up with a provider to get BP at goal ------

## 2014-10-02 ENCOUNTER — Ambulatory Visit (INDEPENDENT_AMBULATORY_CARE_PROVIDER_SITE_OTHER): Payer: Medicare Other | Admitting: Pharmacist

## 2014-10-02 ENCOUNTER — Encounter: Payer: Self-pay | Admitting: Pharmacist

## 2014-10-02 VITALS — BP 146/84 | HR 74 | Ht 68.0 in | Wt 246.5 lb

## 2014-10-02 DIAGNOSIS — Z23 Encounter for immunization: Secondary | ICD-10-CM

## 2014-10-02 DIAGNOSIS — E1165 Type 2 diabetes mellitus with hyperglycemia: Secondary | ICD-10-CM

## 2014-10-02 DIAGNOSIS — I10 Essential (primary) hypertension: Secondary | ICD-10-CM

## 2014-10-02 MED ORDER — GLIMEPIRIDE 1 MG PO TABS: 1.0000 mg | ORAL_TABLET | Freq: Every day | ORAL | Status: DC

## 2014-10-02 NOTE — Patient Instructions (Signed)
Restart carvediolol 12.5mg  - take 1 tablet twice a day with food until current prescription is taken up. Then switch to metoprolol XL 50mg  - take 1 tablet once a day with food.   Continue glimepiride 1mg  - take 1 tablet a day with breakfast.   Diabetes and Standards of Medical Care  Diabetes is complicated. You may find that your diabetes team includes a dietitian, nurse, diabetes educator, eye doctor, and more. To help everyone know what is going on and to help you get the care you deserve, the following schedule of care was developed to help keep you on track. Below are the tests, exams, vaccines, medicines, education, and plans you will need.  Blood Glucose Goals Prior to meals = 80 - 130 Within 2 hours of the start of a meal = less than 180  If you have readings less than 80 or over 200 more than twice a week call office for medication adjustment. In the future we hope to restart metformin but we need to make sure kidneys are filtering properly.   HbA1c test (goal is less than 7.0% - your last value was 7.3%) This test shows how well you have controlled your glucose over the past 2 to 3 months. It is used to see if your diabetes management plan needs to be adjusted.   It is performed at least 2 times a year if you are meeting treatment goals.  It is performed 4 times a year if therapy has changed or if you are not meeting treatment goals.   Blood pressure test  This test is performed at every routine medical visit. The goal is less than 140/90 mmHg for most people, but 130/80 mmHg in some cases. Ask your health care provider about your goal. Dental exam  Follow up with the dentist regularly. Eye exam  If you are diagnosed with type 1 diabetes as a child, get an exam upon reaching the age of 74 years or older and have had diabetes for 3 to 5 years. Yearly eye exams are recommended after that initial eye exam.  If you are diagnosed with type 1 diabetes as an adult, get an exam  within 5 years of diagnosis and then yearly.  If you are diagnosed with type 2 diabetes, get an exam as soon as possible after the diagnosis and then yearly. Foot care exam  Visual foot exams are performed at every routine medical visit. The exams check for cuts, injuries, or other problems with the feet.  A comprehensive foot exam should be done yearly. This includes visual inspection as well as assessing foot pulses and testing for loss of sensation.  Check your feet nightly for cuts, injuries, or other problems with your feet. Tell your health care provider if anything is not healing. Kidney function test (urine microalbumin)  This test is performed once a year.  Type 1 diabetes: The first test is performed 5 years after diagnosis.  Type 2 diabetes: The first test is performed at the time of diagnosis.  A serum creatinine and estimated glomerular filtration rate (eGFR) test is done once a year to assess the level of chronic kidney disease (CKD), if present. Lipid profile (cholesterol, HDL, LDL, triglycerides)  Performed every 5 years for most people.  The goal for LDL is less than 100 mg/dL. If you are at high risk, the goal is less than 70 mg/dL.  The goal for HDL is 40 mg/dL 50 mg/dL for men and 50 mg/dL 60 mg/dL for  women. An HDL cholesterol of 60 mg/dL or higher gives some protection against heart disease.  The goal for triglycerides is less than 150 mg/dL. Influenza vaccine, pneumococcal vaccine, and hepatitis B vaccine  The influenza vaccine is recommended yearly.  The pneumococcal vaccine is generally given once in a lifetime. However, there are some instances when another vaccination is recommended. Check with your health care provider.  The hepatitis B vaccine is also recommended for adults with diabetes. Diabetes self-management education  Education is recommended at diagnosis and ongoing as needed. Treatment plan  Your treatment plan is reviewed at every medical  visit. Document Released: 10/12/2009 Document Revised: 08/17/2013 Document Reviewed: 05/17/2013 [facility] Patient Information 2014 [place].

## 2014-10-02 NOTE — Progress Notes (Signed)
Diabetes Follow-Up Visit Chief Complaint:   Chief Complaint  Patient presents with  . Diabetes  . Hypertension     Filed Vitals:   10/02/14 1027  BP: 146/84  Pulse: 74    HPI: Patient is referred for diabetes education.  He was initially diagnosed with type 2 DM 2 years ago.  He was treating with therapeutic lifestyle changes only until about 1 year ago when he was started on metformin.  A1c has been at goal since metformin 500mg  dialy started but until recent hospitalization when Serum Creatinine was elevated and metformin was discontinued.  Patient elevated serum creatinine was likely related to ureteral stone which he states he has not passed yet.  Most recent serum creatinine was improved to 1.21.   Currently taking glimepiride 1mg  daily for BG.  Patient also has question about BP medications.  Lisinopril was discontinued in hospital due to angioedema.  Prior to hospitalization he was taking carvedilol 12.5mg  bid but recently was also prescribed metoporolol XL 50mg  daily.   He was been confused about what medication to take for BP so he has not taken anything for the last 4-5 days.   Home BG Monitoring:  Checking once daily.  Recently readings - 151, 100, 119 Home BP readings:  139/85, 147/91, 132/83, 141/81  Low fat/carbohydrate diet?  Yes Nicotine Abuse?  No Medication Compliance?  Yes Exercise?  No Alcohol Abuse?  No   Exam Edema:  negative  Polyuria:  negative  Polydipsia:  negative Polyphagia:  negative  BMI:  Body mass index is 37.49 kg/(m^2).   Weight changes: down about 2# General Appearance:  alert, oriented, no acute distress and obese Mood/Affect:  normal   Lab Results  Component Value Date   HGBA1C 7.3* 09/21/2014    No results found for this basenameConcepcion Elk: MICROALBUR,  [id number]    Lab Results  Component Value Date   CHOL 129 09/21/2014   HDL 53 09/21/2014   LDLCALC 55 09/21/2014   TRIG 103 09/21/2014   CHOLHDL 2.4 09/21/2014      Assessment: 1.  Diabetes.   Not quite at goals 2.  Blood Pressure.  elevated 3.  Lipids.  At goal   Recommendations: 1.  Medication recommendations at this time are as follows:    Recommended finish current rx for carvediolo 12.5mg  1 tablet bid , then start metoprolol XL 50mg  1 tablet daily  Continue glimepiride 1mg  1 tablet qam with breakfast.    We will continue to monitor Scr - in future if remains stable will switch back to metformin for DM 2.  Reviewed HBG goals:  Fasting 80-130 and 1-2 hour post prandial <180.  Patient is instructed to check BG 1 times per day.   Reviewed s/s of hypoglycemia and how to treat. 3.  BP goal < 140/85. 4.  LDL goal of < 100, HDL > 40 and TG < 150. 5.  Eye Exam yearly and Dental Exam every 6 months. 6.   Influenza vaccine given in office today 7.  Return to clinic in 4-6 wks   Time spent counseling patient:  30 minutes  Referring provider:  Jannifer Rodneyhristy Hawks, NP  Henrene Pastorammy Luvena Wentling, PharmD, CPP, CDE

## 2014-10-09 ENCOUNTER — Encounter (HOSPITAL_BASED_OUTPATIENT_CLINIC_OR_DEPARTMENT_OTHER): Payer: Self-pay | Admitting: *Deleted

## 2014-10-09 ENCOUNTER — Other Ambulatory Visit: Payer: Self-pay | Admitting: *Deleted

## 2014-10-09 DIAGNOSIS — G4733 Obstructive sleep apnea (adult) (pediatric): Secondary | ICD-10-CM

## 2014-10-09 NOTE — Progress Notes (Signed)
[facility] - [place] [facility] on arrival-Ekg with chart.Npo after MN-solid food -clear liquids till 0630,then Npo-will take toprol xl-use his am inhaler and bring it with him.

## 2014-10-09 NOTE — Progress Notes (Signed)
10/09/14 1354  OBSTRUCTIVE SLEEP APNEA  Have you ever been diagnosed with sleep apnea through a sleep study? No  Do you snore loudly (loud enough to be heard through closed doors)?  0  Do you often feel tired, fatigued, or sleepy during the daytime? 0  Has anyone observed you stop breathing during your sleep? 0  Do you have, or are you being treated for high blood pressure? 1  BMI more than 35 kg/m2? 1  Age over 72 years old? 1  Neck circumference greater than 40 cm/16 inches? 0  Gender: 1  Obstructive Sleep Apnea Score 4  Score 4 or greater  Results sent to PCP

## 2014-10-11 ENCOUNTER — Encounter (HOSPITAL_BASED_OUTPATIENT_CLINIC_OR_DEPARTMENT_OTHER): Payer: Self-pay | Admitting: *Deleted

## 2014-10-11 ENCOUNTER — Ambulatory Visit (HOSPITAL_BASED_OUTPATIENT_CLINIC_OR_DEPARTMENT_OTHER): Payer: Medicare Other | Admitting: Anesthesiology

## 2014-10-11 ENCOUNTER — Encounter (HOSPITAL_BASED_OUTPATIENT_CLINIC_OR_DEPARTMENT_OTHER): Payer: Medicare Other | Admitting: Anesthesiology

## 2014-10-11 ENCOUNTER — Encounter (HOSPITAL_BASED_OUTPATIENT_CLINIC_OR_DEPARTMENT_OTHER)
Admission: RE | Disposition: A | Discharge status: Home / Self Care | Payer: Self-pay | Source: Ambulatory Visit | Attending: Urology

## 2014-10-11 ENCOUNTER — Ambulatory Visit (HOSPITAL_BASED_OUTPATIENT_CLINIC_OR_DEPARTMENT_OTHER)
Admission: RE | Admit: 2014-10-11 | Discharge: 2014-10-11 | Disposition: A | Discharge status: Home / Self Care | Payer: Medicare Other | Source: Ambulatory Visit | Attending: Urology | Admitting: Urology

## 2014-10-11 DIAGNOSIS — J449 Chronic obstructive pulmonary disease, unspecified: Secondary | ICD-10-CM | POA: Insufficient documentation

## 2014-10-11 DIAGNOSIS — Z6837 Body mass index (BMI) 37.0-37.9, adult: Secondary | ICD-10-CM | POA: Insufficient documentation

## 2014-10-11 DIAGNOSIS — E669 Obesity, unspecified: Secondary | ICD-10-CM | POA: Insufficient documentation

## 2014-10-11 DIAGNOSIS — Z888 Allergy status to other drugs, medicaments and biological substances status: Secondary | ICD-10-CM | POA: Diagnosis not present

## 2014-10-11 DIAGNOSIS — I10 Essential (primary) hypertension: Secondary | ICD-10-CM | POA: Diagnosis not present

## 2014-10-11 DIAGNOSIS — N201 Calculus of ureter: Secondary | ICD-10-CM | POA: Insufficient documentation

## 2014-10-11 DIAGNOSIS — E785 Hyperlipidemia, unspecified: Secondary | ICD-10-CM | POA: Diagnosis not present

## 2014-10-11 DIAGNOSIS — N1330 Unspecified hydronephrosis: Secondary | ICD-10-CM | POA: Diagnosis not present

## 2014-10-11 DIAGNOSIS — N200 Calculus of kidney: Secondary | ICD-10-CM

## 2014-10-11 DIAGNOSIS — E119 Type 2 diabetes mellitus without complications: Secondary | ICD-10-CM | POA: Diagnosis not present

## 2014-10-11 DIAGNOSIS — N281 Cyst of kidney, acquired: Secondary | ICD-10-CM | POA: Insufficient documentation

## 2014-10-11 DIAGNOSIS — Z87891 Personal history of nicotine dependence: Secondary | ICD-10-CM | POA: Diagnosis not present

## 2014-10-11 HISTORY — PX: CYSTOSCOPY WITH RETROGRADE PYELOGRAM, URETEROSCOPY AND STENT PLACEMENT: SHX5789

## 2014-10-11 LAB — POCT I-STAT, CHEM 8
BUN: 16 mg/dL (ref 6–23)
Calcium, Ion: 1.21 mmol/L (ref 1.13–1.30)
Chloride: 102 mEq/L (ref 96–112)
Creatinine, Ser: 1 mg/dL (ref 0.50–1.35)
Glucose, Bld: 125 mg/dL — ABNORMAL HIGH (ref 70–99)
HCT: 46 % (ref 39.0–52.0)
Hemoglobin: 15.6 g/dL (ref 13.0–17.0)
Potassium: 3.9 mEq/L (ref 3.7–5.3)
Sodium: 139 mEq/L (ref 137–147)
TCO2: 30 mmol/L (ref 0–100)

## 2014-10-11 LAB — GLUCOSE, CAPILLARY: Glucose-Capillary: 124 mg/dL — ABNORMAL HIGH (ref 70–99)

## 2014-10-11 SURGERY — CYSTOURETEROSCOPY, WITH RETROGRADE PYELOGRAM AND STENT INSERTION
Anesthesia: General | Site: Ureter | Laterality: Left

## 2014-10-11 MED ORDER — IOHEXOL 350 MG/ML SOLN
INTRAVENOUS | Status: DC | PRN
  Administered 2014-10-11: 21 mL

## 2014-10-11 MED ORDER — KETOROLAC TROMETHAMINE 30 MG/ML IJ SOLN
INTRAMUSCULAR | Status: DC | PRN
  Administered 2014-10-11: 15 mg via INTRAVENOUS

## 2014-10-11 MED ORDER — FENTANYL CITRATE 0.05 MG/ML IJ SOLN
25.0000 ug | INTRAMUSCULAR | Status: DC | PRN
  Filled 2014-10-11: qty 1

## 2014-10-11 MED ORDER — EPHEDRINE SULFATE 50 MG/ML IJ SOLN
INTRAMUSCULAR | Status: DC | PRN
  Administered 2014-10-11: 10 mg via INTRAVENOUS

## 2014-10-11 MED ORDER — SENNOSIDES-DOCUSATE SODIUM 8.6-50 MG PO TABS: 1.0000 | ORAL_TABLET | Freq: Two times a day (BID) | ORAL | Status: DC

## 2014-10-11 MED ORDER — FENTANYL CITRATE 0.05 MG/ML IJ SOLN
INTRAMUSCULAR | Status: AC
  Filled 2014-10-11: qty 4

## 2014-10-11 MED ORDER — DEXAMETHASONE SODIUM PHOSPHATE 4 MG/ML IJ SOLN
INTRAMUSCULAR | Status: DC | PRN
  Administered 2014-10-11: 8 mg via INTRAVENOUS

## 2014-10-11 MED ORDER — ACETAMINOPHEN 10 MG/ML IV SOLN
INTRAVENOUS | Status: DC | PRN
  Administered 2014-10-11: 1000 mg via INTRAVENOUS

## 2014-10-11 MED ORDER — LACTATED RINGERS IV SOLN
INTRAVENOUS | Status: DC
  Administered 2014-10-11: 13:00:00 via INTRAVENOUS
  Filled 2014-10-11: qty 1000

## 2014-10-11 MED ORDER — FENTANYL CITRATE 0.05 MG/ML IJ SOLN
INTRAMUSCULAR | Status: DC | PRN
  Administered 2014-10-11: 50 ug via INTRAVENOUS
  Administered 2014-10-11 (×2): 25 ug via INTRAVENOUS

## 2014-10-11 MED ORDER — LIDOCAINE HCL (CARDIAC) 20 MG/ML IV SOLN
INTRAVENOUS | Status: DC | PRN
  Administered 2014-10-11: 80 mg via INTRAVENOUS

## 2014-10-11 MED ORDER — GENTAMICIN IN SALINE 1.6-0.9 MG/ML-% IV SOLN
80.0000 mg | INTRAVENOUS | Status: AC
  Administered 2014-10-11: 430 mg via INTRAVENOUS
  Filled 2014-10-11: qty 50

## 2014-10-11 MED ORDER — OXYCODONE-ACETAMINOPHEN 5-325 MG PO TABS: 1.0000 | ORAL_TABLET | Freq: Four times a day (QID) | ORAL | Status: DC | PRN

## 2014-10-11 MED ORDER — ONDANSETRON HCL 4 MG/2ML IJ SOLN
INTRAMUSCULAR | Status: DC | PRN
  Administered 2014-10-11: 4 mg via INTRAVENOUS

## 2014-10-11 MED ORDER — SODIUM CHLORIDE 0.9 % IR SOLN
Status: DC | PRN
  Administered 2014-10-11: 4000 mL

## 2014-10-11 MED ORDER — PROPOFOL 10 MG/ML IV BOLUS
INTRAVENOUS | Status: DC | PRN
  Administered 2014-10-11: 200 mg via INTRAVENOUS

## 2014-10-11 MED ORDER — CEPHALEXIN 500 MG PO CAPS: 500.0000 mg | ORAL_CAPSULE | Freq: Two times a day (BID) | ORAL | Status: DC

## 2014-10-11 MED ORDER — STERILE WATER FOR IRRIGATION IR SOLN
Status: DC | PRN
  Administered 2014-10-11: 500 mL

## 2014-10-11 MED ORDER — ONDANSETRON HCL 4 MG/2ML IJ SOLN
4.0000 mg | Freq: Once | INTRAMUSCULAR | Status: DC | PRN
  Filled 2014-10-11: qty 2

## 2014-10-11 SURGICAL SUPPLY — 37 items
BAG DRAIN URO-CYSTO SKYTR STRL (DRAIN) ×3 IMPLANT
BAG DRN UROCATH (DRAIN) ×2
BASKET LASER NITINOL 1.9FR (BASKET) ×3 IMPLANT
BASKET STNLS GEMINI 4WIRE 3FR (BASKET) IMPLANT
BASKET ZERO TIP NITINOL 2.4FR (BASKET) IMPLANT
BSKT STON RTRVL 120 1.9FR (BASKET) ×2
BSKT STON RTRVL GEM 120X11 3FR (BASKET)
BSKT STON RTRVL ZERO TP 2.4FR (BASKET)
CANISTER SUCT LVC 12 LTR MEDI- (MISCELLANEOUS) ×3 IMPLANT
CATH INTERMIT  6FR 70CM (CATHETERS) ×3 IMPLANT
CATH URET 5FR 28IN CONE TIP (BALLOONS)
CATH URET 5FR 28IN OPEN ENDED (CATHETERS) IMPLANT
CATH URET 5FR 70CM CONE TIP (BALLOONS) IMPLANT
CLOTH BEACON ORANGE TIMEOUT ST (SAFETY) ×3 IMPLANT
DRAPE CAMERA CLOSED 9X96 (DRAPES) ×3 IMPLANT
ELECT REM PT RETURN 9FT ADLT (ELECTROSURGICAL)
ELECTRODE REM PT RTRN 9FT ADLT (ELECTROSURGICAL) IMPLANT
FIBER LASER FLEXIVA 200 (UROLOGICAL SUPPLIES) IMPLANT
FIBER LASER FLEXIVA 365 (UROLOGICAL SUPPLIES) IMPLANT
GLOVE BIO SURGEON STRL SZ7.5 (GLOVE) ×3 IMPLANT
GOWN PREVENTION PLUS LG XLONG (DISPOSABLE) ×6 IMPLANT
GOWN STRL REIN XL XLG (GOWN DISPOSABLE) ×3 IMPLANT
GUIDEWIRE 0.038 PTFE COATED (WIRE) IMPLANT
GUIDEWIRE ANG ZIPWIRE 038X150 (WIRE) ×3 IMPLANT
GUIDEWIRE STR DUAL SENSOR (WIRE) ×3 IMPLANT
IV NS IRRIG 3000ML ARTHROMATIC (IV SOLUTION) ×6 IMPLANT
KIT BALLIN UROMAX 15FX10 (LABEL) IMPLANT
KIT BALLN UROMAX 15FX4 (MISCELLANEOUS) IMPLANT
KIT BALLN UROMAX 26 75X4 (MISCELLANEOUS)
PACK CYSTO (CUSTOM PROCEDURE TRAY) ×3 IMPLANT
SET HIGH PRES BAL DIL (LABEL)
SHEATH URET ACCESS 12FR/35CM (UROLOGICAL SUPPLIES) IMPLANT
SHEATH URET ACCESS 12FR/55CM (UROLOGICAL SUPPLIES) IMPLANT
STENT URET 6FRX26 CONTOUR (STENTS) ×2 IMPLANT
SYRINGE 10CC LL (SYRINGE) ×3 IMPLANT
SYRINGE IRR TOOMEY STRL 70CC (SYRINGE) IMPLANT
TUBE FEEDING 8FR 16IN STR KANG (MISCELLANEOUS) IMPLANT

## 2014-10-11 NOTE — Brief Op Note (Signed)
10/11/2014  4:04 PM  PATIENT:  Vincent Collins  73 y.o. male  PRE-OPERATIVE DIAGNOSIS:  LEFT URETERAL STONE  POST-OPERATIVE DIAGNOSIS:  LEFT URETERAL STONE  PROCEDURE:  Procedure(s): CYSTOSCOPY WITH RETROGRADE PYELOGRAM, URETEROSCOPY AND STENT PLACEMENT (Left) HOLMIUM LASER APPLICATION (Left)  SURGEON:  Surgeon(s) and Role:    * Sebastian Acheheodore Pheobe Sandiford, MD - Primary  PHYSICIAN ASSISTANT:   ASSISTANTS: none   ANESTHESIA:   general  EBL:  Total I/O In: 600 [I.V.:600] Out: -   BLOOD ADMINISTERED:none  DRAINS: none   LOCAL MEDICATIONS USED:  NONE  SPECIMEN:  No Specimen  DISPOSITION OF SPECIMEN:  N/A  COUNTS:  YES  TOURNIQUET:  * No tourniquets in log *  DICTATION: .Other Dictation: Dictation Number [id number]  PLAN OF CARE: Discharge to home after PACU  PATIENT DISPOSITION:  PACU - hemodynamically stable.   Delay start of Pharmacological VTE agent (>24hrs) due to surgical blood loss or risk of bleeding: not applicable

## 2014-10-11 NOTE — Anesthesia Procedure Notes (Signed)
Procedure Name: LMA Insertion Date/Time: 10/11/2014 3:32 PM Performed by: Norva PavlovALLAWAY, Vincent Bines G Pre-anesthesia Checklist: Patient identified, Emergency Drugs available, Suction available and Patient being monitored Patient Re-evaluated:Patient Re-evaluated prior to inductionOxygen Delivery Method: Circle System Utilized Preoxygenation: Pre-oxygenation with 100% oxygen Intubation Type: IV induction Ventilation: Mask ventilation without difficulty LMA: LMA inserted LMA Size: 5.0 Number of attempts: 1 Airway Equipment and Method: bite block Placement Confirmation: positive ETCO2 Tube secured with: Tape Dental Injury: Teeth and Oropharynx as per pre-operative assessment

## 2014-10-11 NOTE — Transfer of Care (Signed)
Immediate Anesthesia Transfer of Care Note  Patient: Vincent Collins  Procedure(s) Performed: Procedure(s) (LRB): CYSTOSCOPY WITH RETROGRADE PYELOGRAM, DIAGNOSTIC URETEROSCOPY AND STENT PLACEMENT (Left)  Patient Location: PACU  Anesthesia Type: General  Level of Consciousness: awake, alert  and oriented  Airway & Oxygen Therapy: Patient Spontanous Breathing and Patient connected to face mask oxygen  Post-op Assessment: Report given to PACU RN and Post -op Vital signs reviewed and stable  Post vital signs: Reviewed and stable  Complications: No apparent anesthesia complications

## 2014-10-11 NOTE — Anesthesia Preprocedure Evaluation (Signed)
Anesthesia Evaluation  Patient identified by MRN, date of birth, ID band Patient awake    Reviewed: Allergy & Precautions, H&P , NPO status , Patient's Chart, lab work & pertinent test results  Airway Mallampati: II TM Distance: >3 FB Neck ROM: Full    Dental no notable dental hx.    Pulmonary COPD COPD inhaler, former smoker,  breath sounds clear to auscultation  Pulmonary exam normal       Cardiovascular Exercise Tolerance: Good hypertension, Pt. on medications and Pt. on home beta blockers Rhythm:Regular Rate:Normal     Neuro/Psych PSYCHIATRIC DISORDERS Anxiety negative neurological ROS     GI/Hepatic negative GI ROS, Neg liver ROS,   Endo/Other  diabetes, Poorly Controlled, Type 2, Oral Hypoglycemic Agents  Renal/GU Renal disease  negative genitourinary   Musculoskeletal negative musculoskeletal ROS (+)   Abdominal   Peds negative pediatric ROS (+)  Hematology negative hematology ROS (+)   Anesthesia Other Findings   Reproductive/Obstetrics negative OB ROS                           Anesthesia Physical Anesthesia Plan  ASA: III  Anesthesia Plan: General   Post-op Pain Management:    Induction: Intravenous  Airway Management Planned: LMA  Additional Equipment:   Intra-op Plan:   Post-operative Plan: Extubation in OR  Informed Consent: I have reviewed the patients History and Physical, chart, labs and discussed the procedure including the risks, benefits and alternatives for the proposed anesthesia with the patient or authorized representative who has indicated his/her understanding and acceptance.   Dental advisory given  Plan Discussed with: CRNA  Anesthesia Plan Comments:         Anesthesia Quick Evaluation

## 2014-10-11 NOTE — H&P (Signed)
Gabriel EaringJames D Touch is an 73 y.o. male.    Chief Complaint: Pre-OP right Ureteroscopic Stone Manipulation  HPI:      1 - Nephrolithiasis - left 4mm UVJ stone with mild hydro (SSD 13cm, 300HU, at level of upper 1/8 of femoral head) by ER CT 08/2014 on eval colicky flank pain. NO additional or prior stones. Given trial of medical therapy with pain meds and alpha blocker and reports no interval passage. .  2 - Non-Complex Renal Cysts - bilateral <1.5cm simple cortical cysts incidental on CT 08/2014.  3 - Lower Urinary Tract Symptoms - modest bother from obstructive symptoms. DRE 08/2014 80gm+. Was on alpha blocker in past but stopped.   PMH sig for DM2, HLD, Obesity, COPD. His PCP is Jannifer Rodneyhristy Hawks, [facility].  Today Fayrene FearingJames is seen to proceed with right ureteroscopic stone manipulation. No interval stone passage.   Past Medical History  Diagnosis Date  . Hypertension   . Hyperlipidemia   . COPD (chronic obstructive pulmonary disease)   . Diverticulitis   . Diabetes mellitus without complication   . Hydronephrosis of left kidney   . Left ureteral stone     obstructing  . Renal calculus or stone 2015    Past Surgical History  Procedure Laterality Date  . Spine surgery    . Lumbar disc surgery    . 2d echo  04/04/09    Family History  Problem Relation Age of Onset  . Early death Father     MI age 72   Social History:  reports that he has quit smoking. He has quit using smokeless tobacco. His smokeless tobacco use included Chew. He reports that he does not drink alcohol or use illicit drugs.  Allergies:  Allergies  Allergen Reactions  . Ace Inhibitors Shortness Of Breath and Swelling  . Diphenhydramine Hcl Other (See Comments)    Elevates heart rate    No prescriptions prior to admission    No results found for this or any previous visit (from the past 48 hour(s)). No results found.  Review of Systems  Constitutional: Negative.  Negative for fever and  chills.  HENT: Negative.   Eyes: Negative.   Respiratory: Negative.   Cardiovascular: Negative.   Gastrointestinal: Negative.   Genitourinary: Positive for flank pain.  Musculoskeletal: Negative.   Skin: Negative.   Neurological: Negative.   Endo/Heme/Allergies: Negative.   Psychiatric/Behavioral: Negative.     Height 5\' 8"  (1.727 m), weight 110.678 kg (244 lb). Physical Exam  Constitutional: He is oriented to person, place, and time. He appears well-developed and well-nourished.  HENT:  Head: Normocephalic.  Eyes: Pupils are equal, round, and reactive to light.  Neck: Normal range of motion. Neck supple.  Cardiovascular: Normal rate.   Respiratory: Effort normal.  GI: Soft.  Genitourinary: Penis normal.  Mild Rt CVAT  Musculoskeletal: Normal range of motion.  Neurological: He is alert and oriented to person, place, and time.  Skin: Skin is warm and dry.  Psychiatric: He has a normal mood and affect. His behavior is normal. Judgment and thought content normal.     Assessment/Plan     1 - Nephrolithiasis -   We rediscussed ureteroscopic stone manipulation with basketing and laser-lithotripsy in detail.  We rediscussed risks including bleeding, infection, damage to kidney / ureter  bladder, rarely loss of kidney. We rediscussed anesthetic risks and rare but serious surgical complications including DVT, PE, MI, and mortality. We specifically addressed that in 5-10% of cases  a staged approach is required with stenting followed by re-attempt ureteroscopy if anatomy unfavorable.   The patient voiced understanding and wises to proceed today as planned.   2 - Non-Complex Renal Cysts - no indication for further evaluation or surveillance, observe.   3 - Lower Urinary Tract Symptoms - althogh prostate quite large on exam, his bother is modest. We discussed observation v. trial of finasteride and he opts for observation. Will reconsider if symptoms progress.   Ledon Weihe,  Dayn Barich 10/11/2014, 10:03 AM

## 2014-10-11 NOTE — Discharge Instructions (Signed)
1 - You may have urinary urgency (bladder spasms) and bloody urine on / off with stent in place. This is normal.  2 - Call MD or go to ER for fever >102, severe pain / nausea / vomiting not relieved by medications, or acute change in medical status  3 - Remove tethered stent on Friday morning at home by pulling on string, then white plastic tubing, and discarding. Dr. Berneice HeinrichManny is in the office Friday afternoon if any issues arise.    CYSTOSCOPY HOME CARE INSTRUCTIONS  Activity: Rest for the remainder of the day.  Do not drive or operate equipment today.  You may resume normal activities in one to two days as instructed by your physician.   Meals: Drink plenty of liquids and eat light foods such as gelatin or soup this evening.  You may return to a normal meal plan tomorrow.  Return to Work: You may return to work in one to two days or as instructed by your physician.  Special Instructions / Symptoms: Call your physician if any of these symptoms occur:   -persistent or heavy bleeding  -bleeding which continues after first few urination  -large blood clots that are difficult to pass  -urine stream diminishes or stops completely  -fever equal to or higher than 101 degrees Farenheit.  -cloudy urine with a strong, foul odor  -severe pain  Females should always wipe from front to back after elimination.  You may feel some burning pain when you urinate.  This should disappear with time.  Applying moist heat to the lower abdomen or a hot tub bath may help relieve the pain. \  Follow-Up / Date of Return Visit to Your Physician:  *** Call for an appointment to arrange follow-up.  Patient Signature:  ________________________________________________________  Nurse's Signature:  ________________________________________________________    Post Anesthesia Home Care Instructions  Activity: Get plenty of rest for the remainder of the day. A responsible adult should stay with you for 24 hours  following the procedure.  For the next 24 hours, DO NOT: -Drive a car -Advertising copywriterperate machinery -Drink alcoholic beverages -Take any medication unless instructed by your physician -Make any legal decisions or sign important papers.  Meals: Start with liquid foods such as gelatin or soup. Progress to regular foods as tolerated. Avoid greasy, spicy, heavy foods. If nausea and/or vomiting occur, drink only clear liquids until the nausea and/or vomiting subsides. Call your physician if vomiting continues.  Special Instructions/Symptoms: Your throat may feel dry or sore from the anesthesia or the breathing tube placed in your throat during surgery. If this causes discomfort, gargle with warm salt water. The discomfort should disappear within 24 hours.

## 2014-10-12 ENCOUNTER — Encounter (HOSPITAL_BASED_OUTPATIENT_CLINIC_OR_DEPARTMENT_OTHER): Payer: Self-pay | Admitting: Urology

## 2014-10-12 NOTE — Op Note (Signed)
NAME:  Vincent Collins, Vincent Collins                 ACCOUNT NO.:  [id number]  MEDICAL RECORD NO.:  [id number]  LOCATION:                                FACILITY:  [facility]  PHYSICIAN:  Baden Betsch, MD     DATE OF BIRTH:  03/21/1941  DATE OF PROCEDURE:  10/11/2014 DATE OF DISCHARGE:  10/11/2014                              OPERATIVE REPORT   DIAGNOSIS: 1. Left ureteral stone.  PROCEDURE: 1. Cystoscopy with left retrograde pyelogram and interpretation. 2. Left diagnostic ureteroscopy. 3. Insertion of left ureteral stent, 6 x 26 with tether to dorsum of     the penis.  ESTIMATED BLOOD LOSS:  Nil.  COMPLICATIONS:  None.  SPECIMEN:  None.  FINDINGS: 1. Unremarkable urinary bladder. 2. No evidence of left ureteral or renal stone suggesting interval     passage.  No evidence of left hydroureteronephrosis.  INDICATION:  Vincent Collins is a pleasant 73-year-old gentleman who was found on workup of colicky left flank pain to have a left distal ureteral stone last month.  He underwent trial of medical therapy with alpha blockers and pain medication, but did not definitively pass the stone. He was therefore offered left ureteroscopic stone manipulation as definitive management as the stone was not easily visible on plain film and he wished to proceed.  Informed consent was obtained and placed in the medical record.  PROCEDURE IN DETAIL:  The patient being Vincent Collins was verified. Procedure being left ureteroscopic stone manipulation was confirmed. Procedure was carried out.  Time-out was performed.  Intravenous antibiotics were administered.  General LMA anesthesia was introduced. The patient placed into a low lithotomy position.  Sterile field was created by prepping and draping the patient's penis, perineum, and proximal thighs using iodine x3.  Next, cystourethroscopy was performed using 22-French rigid cystoscope with 12-degree offset lens.  Inspection of the anterior and posterior urethra  unremarkable.  Inspection of the urinary bladder revealed no diverticula, calcifications, or papular lesions.  Ureteral orifices were Singleton bilaterally.  The left ureteral orifice was cannulated with a 6-French end-hole catheter and left retrograde pyelogram was obtained.  Left retrograde pyelogram demonstrated single left ureter with single system left kidney.  No filling defects or narrowing or hydronephrosis noted whatsoever.  The 0.038 Glidewire was advanced at the level of the upper pole and set aside as a safety wire.  Next, semi-rigid ureteroscopy was performed in the distal two-thirds of the left ureter alongside a separate Sensor working wire.  No mucosal abnormalities, filling defects, or calcifications were seen whatsoever.  There was also no ureteral dilation, this was just likely interval passage of stone. As the goal today was to verify stone free status, it was felt that flexible ureteroscopy was warranted to rule out retrograde positioning of fragments into the kidney.  As such, semi-rigid ureteroscope was exchanged for the 6-French flexible ureteroscope, which was placed using fluoroscopic guidance to the level of the upper pole over the sensor working wire using fluoroscopic guidance.  Next, systematic inspection of the left kidney was performed x3.  Each calix was systematically inspected and no mucosal abnormalities or calcifications were seen whatsoever.  The ureteroscope was then removed   under continuous ureteroscopic vision and again no mucosal abnormalities or calcifications were seen in the entire length of left ureter.  It was felt that interval stenting would be warranted given the flexible ureteroscopy, however, the duration of the stent would not need to be long such that it was felt that tethered stent would achieve this goal. As such, a new 6 x 26 Contour type stent was placed using cystoscopic and fluoroscopic guidance.  Good proximal and distal curl  were noted. Bladder was emptied per cystoscope.  Tether was left in place and fashioned to the dorsum of the penis.  Procedure was terminated.  The patient tolerated the procedure well.  There were no immediate periprocedural complications.  The patient was taken to the postanesthesia care unit in a stable condition.          ______________________________ Sebastian Acheheodore Salle Brandle, MD     TM/MEDQ  D:  10/11/2014  T:  10/12/2014  Job:  [id number]

## 2014-10-12 NOTE — Op Note (Deleted)
NAMSundra Aland:  Dieterich, Mandeep                 ACCOUNT NO.:  [id number]  MEDICAL RECORD NO.:  [id number]  LOCATION:                                FACILITY:  [facility]  PHYSICIAN:  Sebastian Acheheodore Aaleyah Witherow, MD     DATE OF BIRTH:  [birth year]  DATE OF PROCEDURE:  10/11/2014 DATE OF DISCHARGE:  10/11/2014                              OPERATIVE REPORT   DIAGNOSIS: 1. Left ureteral stone.  PROCEDURE: 1. Cystoscopy with left retrograde pyelogram and interpretation. 2. Left diagnostic ureteroscopy. 3. Insertion of left ureteral stent, 6 x 26 with tether to dorsum of     the penis.  ESTIMATED BLOOD LOSS:  Nil.  COMPLICATIONS:  None.  SPECIMEN:  None.  FINDINGS: 1. Unremarkable urinary bladder. 2. No evidence of left ureteral or renal stone suggesting interval     passage.  No evidence of left hydroureteronephrosis.  INDICATION:  Mr. Tasia CatchingsCraig is a pleasant 73 year old gentleman who was found on workup of colicky left flank pain to have a left distal ureteral stone last month.  He underwent trial of medical therapy with alpha blockers and pain medication, but did not definitively pass the stone. He was therefore offered left ureteroscopic stone manipulation as definitive management as the stone was not easily visible on plain film and he wished to proceed.  Informed consent was obtained and placed in the medical record.  PROCEDURE IN DETAIL:  The patient being Charlean SanfilippoJames Dyal was verified. Procedure being left ureteroscopic stone manipulation was confirmed. Procedure was carried out.  Time-out was performed.  Intravenous antibiotics were administered.  General LMA anesthesia was introduced. The patient placed into a low lithotomy position.  Sterile field was created by prepping and draping the patient's penis, perineum, and proximal thighs using iodine x3.  Next, cystourethroscopy was performed using 22-French rigid cystoscope with 12-degree offset lens.  Inspection of the anterior and posterior urethra  unremarkable.  Inspection of the urinary bladder revealed no diverticula, calcifications, or papular lesions.  Ureteral orifices were Singleton bilaterally.  The left ureteral orifice was cannulated with a 6-French end-hole catheter and left retrograde pyelogram was obtained.  Left retrograde pyelogram demonstrated single left ureter with single system left kidney.  No filling defects or narrowing or hydronephrosis noted whatsoever.  The 0.038 Glidewire was advanced at the level of the upper pole and set aside as a safety wire.  Next, semi-rigid ureteroscopy was performed in the distal two-thirds of the left ureter alongside a separate Sensor working wire.  No mucosal abnormalities, filling defects, or calcifications were seen whatsoever.  There was also no ureteral dilation, this was just likely interval passage of stone. As the goal today was to verify stone free status, it was felt that flexible ureteroscopy was warranted to rule out retrograde positioning of fragments into the kidney.  As such, semi-rigid ureteroscope was exchanged for the 6-French flexible ureteroscope, which was placed using fluoroscopic guidance to the level of the upper pole over the sensor working wire using fluoroscopic guidance.  Next, systematic inspection of the left kidney was performed x3.  Each calix was systematically inspected and no mucosal abnormalities or calcifications were seen whatsoever.  The ureteroscope was then removed  under continuous ureteroscopic vision and again no mucosal abnormalities or calcifications were seen in the entire length of left ureter.  It was felt that interval stenting would be warranted given the flexible ureteroscopy, however, the duration of the stent would not need to be long such that it was felt that tethered stent would achieve this goal. As such, a new 6 x 26 Contour type stent was placed using cystoscopic and fluoroscopic guidance.  Good proximal and distal curl  were noted. Bladder was emptied per cystoscope.  Tether was left in place and fashioned to the dorsum of the penis.  Procedure was terminated.  The patient tolerated the procedure well.  There were no immediate periprocedural complications.  The patient was taken to the postanesthesia care unit in a stable condition.          ______________________________ Sebastian Acheheodore Kire Ferg, MD     TM/MEDQ  D:  10/11/2014  T:  10/12/2014  Job:  [id number]

## 2014-10-13 NOTE — Anesthesia Postprocedure Evaluation (Signed)
  Anesthesia Post-op Note  Patient: Vincent EaringJames D Collins  Procedure(s) Performed: Procedure(s) (LRB): CYSTOSCOPY WITH RETROGRADE PYELOGRAM, DIAGNOSTIC URETEROSCOPY AND STENT PLACEMENT (Left)  Patient Location: PACU  Anesthesia Type: General  Level of Consciousness: awake and alert   Airway and Oxygen Therapy: Patient Spontanous Breathing  Post-op Pain: mild  Post-op Assessment: Post-op Vital signs reviewed, Patient's Cardiovascular Status Stable, Respiratory Function Stable, Patent Airway and No signs of Nausea or vomiting  Last Vitals:  Filed Vitals:   10/11/14 1736  BP: 146/75  Pulse: 62  Temp: 36.6 C  Resp: 18    Post-op Vital Signs: stable   Complications: No apparent anesthesia complications

## 2014-11-02 ENCOUNTER — Ambulatory Visit (INDEPENDENT_AMBULATORY_CARE_PROVIDER_SITE_OTHER): Payer: Medicare Other | Admitting: Pharmacist

## 2014-11-02 ENCOUNTER — Encounter: Payer: Self-pay | Admitting: Pharmacist

## 2014-11-02 VITALS — BP 136/84 | HR 68 | Ht 68.0 in | Wt 248.0 lb

## 2014-11-02 DIAGNOSIS — R7989 Other specified abnormal findings of blood chemistry: Secondary | ICD-10-CM

## 2014-11-02 DIAGNOSIS — R748 Abnormal levels of other serum enzymes: Secondary | ICD-10-CM

## 2014-11-02 DIAGNOSIS — E119 Type 2 diabetes mellitus without complications: Secondary | ICD-10-CM

## 2014-11-02 DIAGNOSIS — I10 Essential (primary) hypertension: Secondary | ICD-10-CM

## 2014-11-02 DIAGNOSIS — Z79899 Other long term (current) drug therapy: Secondary | ICD-10-CM

## 2014-11-02 DIAGNOSIS — Z09 Encounter for follow-up examination after completed treatment for conditions other than malignant neoplasm: Secondary | ICD-10-CM

## 2014-11-02 MED ORDER — FAMOTIDINE 20 MG PO TABS: 20.0000 mg | ORAL_TABLET | Freq: Two times a day (BID) | ORAL | Status: DC

## 2014-11-02 NOTE — Patient Instructions (Signed)
Decrease metoprolol XL $RemoveBefor'50mg'MiImohMPnxYX$  to 1/2 tablet daily.  Continue to check blood pressure and heart rate at home.  If you begin to see blood pressure reading over 140/90 or heart rate over 90 call me.  Also if headaches or dizziness returns call me.  [phone]   Diabetes and Standards of Medical Care   Diabetes is complicated. You may find that your diabetes team includes a dietitian, nurse, diabetes educator, eye doctor, and more. To help everyone know what is going on and to help you get the care you deserve, the following schedule of care was developed to help keep you on track. Below are the tests, exams, vaccines, medicines, education, and plans you will need.  Blood Glucose Goals Prior to meals = 80 - 130 Within 2 hours of the start of a meal = less than 180  HbA1c test (goal is less than 7.0% - your last value was %) This test shows how well you have controlled your glucose over the past 2 to 3 months. It is used to see if your diabetes management plan needs to be adjusted.   It is performed at least 2 times a year if you are meeting treatment goals.  It is performed 4 times a year if therapy has changed or if you are not meeting treatment goals.  Blood pressure test  This test is performed at every routine medical visit. The goal is less than 140/90 mmHg for most people, but 130/80 mmHg in some cases. Ask your health care provider about your goal.  Dental exam  Follow up with the dentist regularly.  Eye exam  If you are diagnosed with type 1 diabetes as a child, get an exam upon reaching the age of 16 years or older and have had diabetes for 3 to 5 years. Yearly eye exams are recommended after that initial eye exam.  If you are diagnosed with type 1 diabetes as an adult, get an exam within 5 years of diagnosis and then yearly.  If you are diagnosed with type 2 diabetes, get an exam as soon as possible after the diagnosis and then yearly.  Foot care exam  Visual foot exams are  performed at every routine medical visit. The exams check for cuts, injuries, or other problems with the feet.  A comprehensive foot exam should be done yearly. This includes visual inspection as well as assessing foot pulses and testing for loss of sensation.  Check your feet nightly for cuts, injuries, or other problems with your feet. Tell your health care provider if anything is not healing.  Kidney function test (urine microalbumin)  This test is performed once a year.  Type 1 diabetes: The first test is performed 5 years after diagnosis.  Type 2 diabetes: The first test is performed at the time of diagnosis.  A serum creatinine and estimated glomerular filtration rate (eGFR) test is done once a year to assess the level of chronic kidney disease (CKD), if present.  Lipid profile (cholesterol, HDL, LDL, triglycerides)  Performed every 5 years for most people.  The goal for LDL is less than 100 mg/dL. If you are at high risk, the goal is less than 70 mg/dL.  The goal for HDL is 40 mg/dL to 50 mg/dL for men and 50 mg/dL to 60 mg/dL for women. An HDL cholesterol of 60 mg/dL or higher gives some protection against heart disease.  The goal for triglycerides is less than 150 mg/dL.  Influenza vaccine, pneumococcal vaccine, and  hepatitis B vaccine  The influenza vaccine is recommended yearly.  The pneumococcal vaccine is generally given once in a lifetime. However, there are some instances when another vaccination is recommended. Check with your health care provider.  The hepatitis B vaccine is also recommended for adults with diabetes.  Diabetes self-management education  Education is recommended at diagnosis and ongoing as needed.  Treatment plan  Your treatment plan is reviewed at every medical visit.  Document Released: 10/12/2009 Document Revised: 08/17/2013 Document Reviewed: 05/17/2013 [facility] Patient Information 2014 [place].

## 2014-11-02 NOTE — Progress Notes (Signed)
Diabetes Follow-Up Visit Chief Complaint:   No chief complaint on file.    There were no vitals filed for this visit.  HPI: Patient is here today to follow up type 2 DM and have BMP rechecked.  He was initially diagnosed with type 2 DM 2 years ago.  He was treating with therapeutic lifestyle changes only until about 1 year ago when he was started on metformin.  A1c has been at goal since metformin 500mg  daily started until recent hospitalization when Serum Creatinine was elevated and metformin was discontinued.  Patient's elevated serum creatinine was likely related to ureteral stone which he states he has not passed yet.  Most recent serum creatinine was improved to 1.21.   Currently taking glimepiride 1mg  daily for BG.   Patient also c/o headache which he atribues to metoprolol therapy.  He states he took only 1/2 tablet of metoprolol 50mg  XL for 2 days and HA resolved and when he restarted 1 tablet daily he had HA again.  Home BG Monitoring:  Checking once daily.  Recently readings - 86, 113, 124, 105, 122, 135, 139, 145 Home BP readings:  118-147/71-90  Low fat/carbohydrate diet?  Yes Nicotine Abuse?  No Medication Compliance?  Yes Exercise?  Yes - started walking Alcohol Abuse?  No   Exam Edema:  negative  Polyuria:  negative  Polydipsia:  negative Polyphagia:  negative  BMI:  There is no weight on file to calculate BMI.   Weight changes: down about 2# General Appearance:  alert, oriented, no acute distress and obese Mood/Affect:  normal   Lab Results  Component Value Date   HGBA1C 7.3* 09/21/2014    No results found for: [facility]  Lab Results  Component Value Date   CHOL 129 09/21/2014   HDL 53 09/21/2014   LDLCALC 55 09/21/2014   TRIG 103 09/21/2014   CHOLHDL 2.4 09/21/2014      Assessment: 1.  Diabetes.  Not quite at goals 2.  Blood Pressure.  elevated 3.  Lipids.  At goal 4.  COPD- not taking Symbicort daily   Recommendations: 1.  Medication  recommendations at this time are as follows:    Decrease metoprolol XL 50mg  1/2 tablet daily  Continue glimepiride 1mg  1 tablet qam with breakfast.    Checking Scr today - in future if remains stable will switch back to metformin for DM  Also discussed maintance vs rescue inhalers - patient to start taking Symbicort daily instead of prn. 2.  Reviewed HBG goals:  Fasting 80-130 and 1-2 hour post prandial <180.  Patient is instructed to check BG 1 times per day.   Reviewed s/s of hypoglycemia and how to treat. 3.  BP goal < 140/85. 4.  LDL goal of < 100, HDL > 40 and TG < 150. 5.  Eye Exam yearly and Dental Exam every 6 months. 6.    Orders Placed This Encounter  Procedures  . BMP8+EGFR   7.  Return to clinic in 6 wks   Time spent counseling patient:  30 minutes  Referring provider:  Evelina Dun, NP  Cherre Robins, PharmD, CPP, CDE

## 2014-11-03 LAB — BMP8+EGFR
BUN/Creatinine Ratio: 13 (ref 10–22)
BUN: 12 mg/dL (ref 8–27)
CALCIUM: 9.2 mg/dL (ref 8.6–10.2)
CO2: 24 mmol/L (ref 18–29)
CREATININE: 0.93 mg/dL (ref 0.76–1.27)
Chloride: 101 mmol/L (ref 97–108)
GFR, EST AFRICAN AMERICAN: 94 mL/min/{1.73_m2} (ref >59)
GFR, EST NON AFRICAN AMERICAN: 82 mL/min/{1.73_m2} (ref >59)
GLUCOSE: 136 mg/dL — AB (ref 65–99)
Potassium: 4.6 mmol/L (ref 3.5–5.2)
Sodium: 141 mmol/L (ref 134–144)

## 2014-11-06 ENCOUNTER — Telehealth: Payer: Self-pay | Admitting: Pharmacist

## 2014-11-06 MED ORDER — METFORMIN HCL ER 500 MG PO TB24: ORAL_TABLET | ORAL | Status: DC

## 2014-11-06 NOTE — Telephone Encounter (Signed)
Serum creatinine was normal.  Recommend restart metformin XR 500mg  1 tablet qd with largest meal.  Patient already has medication at home but will sent in rx to pharmacy to put on profile.  Discontinue glimepiride.

## 2014-11-29 ENCOUNTER — Telehealth: Payer: Self-pay | Admitting: Family

## 2014-11-29 NOTE — Telephone Encounter (Signed)
Patient states that he restarted metoprolol 50mg  1 tablet daily and is feeling fine.  Denies dizziness or low heart rate.  Patient to monitor for dizziness/ lightheadedness.  Will call office if any problems.

## 2014-12-15 ENCOUNTER — Encounter: Payer: Self-pay | Admitting: Family Medicine

## 2014-12-15 ENCOUNTER — Ambulatory Visit (INDEPENDENT_AMBULATORY_CARE_PROVIDER_SITE_OTHER): Payer: Medicare Other | Admitting: Family Medicine

## 2014-12-15 VITALS — BP 150/90 | HR 74 | Temp 97.9°F | Ht 68.0 in | Wt 248.2 lb

## 2014-12-15 DIAGNOSIS — J206 Acute bronchitis due to rhinovirus: Secondary | ICD-10-CM

## 2014-12-15 MED ORDER — HYDROCODONE-HOMATROPINE 5-1.5 MG/5ML PO SYRP: 5.0000 mL | ORAL_SOLUTION | Freq: Three times a day (TID) | ORAL | Status: DC | PRN

## 2014-12-15 MED ORDER — AMOXICILLIN 875 MG PO TABS: 875.0000 mg | ORAL_TABLET | Freq: Two times a day (BID) | ORAL | Status: DC

## 2014-12-15 MED ORDER — METHYLPREDNISOLONE ACETATE 80 MG/ML IJ SUSP
80.0000 mg | Freq: Once | INTRAMUSCULAR | Status: AC
  Administered 2014-12-15: 80 mg via INTRAMUSCULAR

## 2014-12-15 NOTE — Progress Notes (Signed)
   Subjective:    Patient ID: Vincent Collins, male    DOB: [birth year], 73 y.o.   MRN: [id number]  HPI C/o uri sx's and cough.  He has hx of copd.  Review of Systems  Constitutional: Negative for fever.  HENT: Negative for ear pain.   Eyes: Negative for discharge.  Respiratory: Negative for cough.   Cardiovascular: Negative for chest pain.  Gastrointestinal: Negative for abdominal distention.  Endocrine: Negative for polyuria.  Genitourinary: Negative for difficulty urinating.  Musculoskeletal: Negative for gait problem and neck pain.  Skin: Negative for color change and rash.  Neurological: Negative for speech difficulty and headaches.  Psychiatric/Behavioral: Negative for agitation.       Objective:    BP 150/90 mmHg  Pulse 74  Temp(Src) 97.9 F (36.6 C) (Oral)  Ht 5\' 8"  (1.727 m)  Wt 248 lb 3.2 oz (112.583 kg)  BMI 37.75 kg/m2 Physical Exam  Constitutional: He is oriented to person, place, and time. He appears well-developed and well-nourished.  HENT:  Head: Normocephalic and atraumatic.  Mouth/Throat: Oropharynx is clear and moist.  Eyes: Pupils are equal, round, and reactive to light.  Neck: Normal range of motion. Neck supple.  Cardiovascular: Normal rate and regular rhythm.   No murmur heard. Pulmonary/Chest: Effort normal and breath sounds normal.  Abdominal: Soft. Bowel sounds are normal. There is no tenderness.  Neurological: He is alert and oriented to person, place, and time.  Skin: Skin is warm and dry.  Psychiatric: He has a normal mood and affect.          Assessment & Plan:     ICD-9-CM ICD-10-CM   1. Acute bronchitis due to Rhinovirus 466.0 J20.6 amoxicillin (AMOXIL) 875 MG tablet   079.3  methylPREDNISolone acetate (DEPO-MEDROL) injection 80 mg     HYDROcodone-homatropine (HYCODAN) 5-1.5 MG/5ML syrup     No Follow-up on file.  Deatra CanterWilliam J Oxford FNP

## 2014-12-20 ENCOUNTER — Other Ambulatory Visit: Payer: Self-pay | Admitting: Family

## 2015-01-08 ENCOUNTER — Other Ambulatory Visit: Payer: Self-pay | Admitting: Family

## 2015-01-15 ENCOUNTER — Ambulatory Visit: Payer: Medicare Other | Admitting: Family

## 2015-02-08 ENCOUNTER — Encounter: Payer: Self-pay | Admitting: Family

## 2015-02-08 ENCOUNTER — Ambulatory Visit (INDEPENDENT_AMBULATORY_CARE_PROVIDER_SITE_OTHER): Payer: Medicare Other | Admitting: Family

## 2015-02-08 VITALS — BP 152/79 | HR 60 | Temp 97.0°F | Ht 68.0 in | Wt 248.0 lb

## 2015-02-08 DIAGNOSIS — E119 Type 2 diabetes mellitus without complications: Secondary | ICD-10-CM | POA: Diagnosis not present

## 2015-02-08 DIAGNOSIS — E1165 Type 2 diabetes mellitus with hyperglycemia: Secondary | ICD-10-CM | POA: Diagnosis not present

## 2015-02-08 DIAGNOSIS — I10 Essential (primary) hypertension: Secondary | ICD-10-CM

## 2015-02-08 DIAGNOSIS — J449 Chronic obstructive pulmonary disease, unspecified: Secondary | ICD-10-CM | POA: Diagnosis not present

## 2015-02-08 DIAGNOSIS — E559 Vitamin D deficiency, unspecified: Secondary | ICD-10-CM | POA: Diagnosis not present

## 2015-02-08 DIAGNOSIS — N400 Enlarged prostate without lower urinary tract symptoms: Secondary | ICD-10-CM

## 2015-02-08 DIAGNOSIS — R42 Dizziness and giddiness: Secondary | ICD-10-CM

## 2015-02-08 DIAGNOSIS — E785 Hyperlipidemia, unspecified: Secondary | ICD-10-CM

## 2015-02-08 DIAGNOSIS — F419 Anxiety disorder, unspecified: Secondary | ICD-10-CM

## 2015-02-08 LAB — POCT GLYCOSYLATED HEMOGLOBIN (HGB A1C): HEMOGLOBIN A1C: 7

## 2015-02-08 MED ORDER — TAMSULOSIN HCL 0.4 MG PO CAPS: 0.4000 mg | ORAL_CAPSULE | Freq: Every day | ORAL | Status: DC

## 2015-02-08 MED ORDER — ALPRAZOLAM 0.5 MG PO TABS: 0.5000 mg | ORAL_TABLET | Freq: Two times a day (BID) | ORAL | Status: DC

## 2015-02-08 MED ORDER — MECLIZINE HCL 25 MG PO TABS: 25.0000 mg | ORAL_TABLET | Freq: Three times a day (TID) | ORAL | Status: DC | PRN

## 2015-02-08 NOTE — Progress Notes (Signed)
Subjective:    Patient ID: Vincent Collins, male    DOB: 01-Dec-1941, 74 y.o.   MRN: [id number]  Hyperlipidemia This is a chronic problem. The current episode started more than 1 year ago. The problem is controlled. Recent lipid tests were reviewed and are normal. Exacerbating diseases include diabetes and obesity. He has no history of chronic renal disease. Factors aggravating his hyperlipidemia include fatty foods. Pertinent negatives include no chest pain, leg pain, myalgias or shortness of breath. Current antihyperlipidemic treatment includes statins. The current treatment provides moderate improvement of lipids. Risk factors for coronary artery disease include dyslipidemia, family history, male sex, obesity, diabetes mellitus and a sedentary lifestyle.  Hypertension This is a chronic problem. The current episode started more than 1 year ago. The problem has been waxing and waning since onset. The problem is uncontrolled. Associated symptoms include anxiety. Pertinent negatives include no blurred vision, chest pain, headaches, palpitations, peripheral edema or shortness of breath. Risk factors for coronary artery disease include diabetes mellitus, dyslipidemia, family history, male gender and obesity. Past treatments include ACE inhibitors and calcium channel blockers. The current treatment provides significant improvement. Compliance problems include exercise.  There is no history of kidney disease, CAD/MI, CVA, heart failure or a thyroid problem. There is no history of chronic renal disease.  Diabetes He presents for his follow-up diabetic visit. He has type 2 diabetes mellitus. His disease course has been fluctuating. Hypoglycemia symptoms include nervousness/anxiousness. Pertinent negatives for hypoglycemia include no confusion, dizziness or headaches. Pertinent negatives for diabetes include no blurred vision, no chest pain, no foot paresthesias, no foot ulcerations and no visual change. Pertinent  negatives for diabetic complications include no CVA, heart disease, nephropathy or peripheral neuropathy. Risk factors for coronary artery disease include diabetes mellitus, dyslipidemia, hypertension, male sex, obesity and family history. Current diabetic treatment includes oral agent (dual therapy). He is compliant with treatment all of the time. He is following a generally healthy diet. His breakfast blood glucose range is generally 110-130 mg/dl. An ACE inhibitor/angiotensin II receptor blocker is being taken. Eye exam is current.  Anxiety Presents for follow-up visit. Symptoms include excessive worry, nausea, nervous/anxious behavior and panic. Patient reports no chest pain, confusion, depressed mood, dizziness, insomnia, irritability, palpitations or shortness of breath. Symptoms occur occasionally. The symptoms are aggravated by family issues.   His past medical history is significant for anxiety/panic attacks. There is no history of depression. Past treatments include benzodiazephines. The treatment provided mild relief. Compliance with prior treatments has been good.  Benign Prostatic Hypertrophy This is a chronic problem. The current episode started more than 1 year ago. The problem has been waxing and waning since onset. Irritative symptoms include frequency, nocturia and urgency. Associated symptoms include nausea and vomiting. Pertinent negatives include no hematuria. The symptoms are aggravated by diuretics. Past treatments include nothing.  COPD Pt takes symbicort daily. Pt states "my breathing is good". Pt does not have any current complaints.    Review of Systems  Constitutional: Negative.  Negative for irritability.  HENT: Negative.   Eyes: Negative for blurred vision.  Respiratory: Negative.  Negative for shortness of breath.   Cardiovascular: Negative.  Negative for chest pain and palpitations.  Gastrointestinal: Positive for nausea and vomiting.  Endocrine: Negative.     Genitourinary: Positive for urgency, frequency and nocturia. Negative for hematuria.  Musculoskeletal: Negative.  Negative for myalgias.  Neurological: Negative.  Negative for dizziness and headaches.  Hematological: Negative.   Psychiatric/Behavioral: Negative for confusion. The patient  is nervous/anxious. The patient does not have insomnia.   All other systems reviewed and are negative.      Objective:   Physical Exam  Constitutional: He is oriented to person, place, and time. He appears well-developed and well-nourished. No distress.  HENT:  Head: Normocephalic.  Right Ear: External ear normal.  Left Ear: External ear normal.  Nose: Nose normal.  Mouth/Throat: Oropharynx is clear and moist.  Eyes: Pupils are equal, round, and reactive to light. Right eye exhibits no discharge. Left eye exhibits no discharge.  Neck: Normal range of motion. Neck supple. No thyromegaly present.  Cardiovascular: Normal rate, regular rhythm, normal heart sounds and intact distal pulses.   No murmur heard. Pulmonary/Chest: Effort normal and breath sounds normal. No respiratory distress. He has no wheezes.  Abdominal: Soft. Bowel sounds are normal. He exhibits no distension. There is no tenderness.  Musculoskeletal: Normal range of motion. He exhibits no edema or tenderness.  Neurological: He is alert and oriented to person, place, and time. He has normal reflexes. No cranial nerve deficit.  Skin: Skin is warm and dry. No rash noted. No erythema.  Psychiatric: He has a normal mood and affect. His behavior is normal. Judgment and thought content normal.  Vitals reviewed.    BP 152/79 mmHg  Pulse 60  Temp(Src) 97 F (36.1 C) (Oral)  Ht $R'5\' 8"'aQ$  (1.727 m)  Wt 248 lb (112.492 kg)  BMI 37.72 kg/m2      Assessment & Plan:  1. Essential hypertension - CMP14+EGFR  2. Chronic obstructive pulmonary disease, unspecified COPD, unspecified chronic bronchitis type - CMP14+EGFR  3. Type 2 diabetes  mellitus with hyperglycemia - POCT glycosylated hemoglobin (Hb A1C) - CMP14+EGFR  4. BPH (benign prostatic hyperplasia)  - CMP14+EGFR - tamsulosin (FLOMAX) 0.4 MG CAPS capsule; Take 1 capsule (0.4 mg total) by mouth daily.  Dispense: 30 capsule; Refill: 3  5. Vitamin D deficiency - CMP14+EGFR - Vit D  25 hydroxy (rtn osteoporosis monitoring)  6. Hyperlipemia - CMP14+EGFR - Lipid panel  7. Anxiety - CMP14+EGFR - ALPRAZolam (XANAX) 0.5 MG tablet; Take 1 tablet (0.5 mg total) by mouth 2 (two) times daily.  Dispense: 60 tablet; Refill: 2   Continue all meds Labs pending Health Maintenance reviewed Diet and exercise encouraged RTO 3 months   Evelina Dun, FNP

## 2015-02-08 NOTE — Patient Instructions (Addendum)
Health Maintenance A healthy lifestyle and preventative care can promote health and wellness.  Maintain regular health, dental, and eye exams.  Eat a healthy diet. Foods like vegetables, fruits, whole grains, low-fat dairy products, and lean protein foods contain the nutrients you need and are low in calories. Decrease your intake of foods high in solid fats, added sugars, and salt. Get information about a proper diet from your health care provider, if necessary.  Regular physical exercise is one of the most important things you can do for your health. Most adults should get at least 150 minutes of moderate-intensity exercise (any activity that increases your heart rate and causes you to sweat) each week. In addition, most adults need muscle-strengthening exercises on 2 or more days a week.   Maintain a healthy weight. The body mass index (BMI) is a screening tool to identify possible weight problems. It provides an estimate of body fat based on height and weight. Your health care provider can find your BMI and can help you achieve or maintain a healthy weight. For males 20 years and older:  A BMI below 18.5 is considered underweight.  A BMI of 18.5 to 24.9 is normal.  A BMI of 25 to 29.9 is considered overweight.  A BMI of 30 and above is considered obese.  Maintain normal blood lipids and cholesterol by exercising and minimizing your intake of saturated fat. Eat a balanced diet with plenty of fruits and vegetables. Blood tests for lipids and cholesterol should begin at age 20 and be repeated every 5 years. If your lipid or cholesterol levels are high, you are over age 50, or you are at high risk for heart disease, you may need your cholesterol levels checked more frequently.Ongoing high lipid and cholesterol levels should be treated with medicines if diet and exercise are not working.  If you smoke, find out from your health care provider how to quit. If you do not use tobacco, do not  start.  Lung cancer screening is recommended for adults aged 55-80 years who are at high risk for developing lung cancer because of a history of smoking. A yearly low-dose CT scan of the lungs is recommended for people who have at least a 30-pack-year history of smoking and are current smokers or have quit within the past 15 years. A pack year of smoking is smoking an average of 1 pack of cigarettes a day for 1 year (for example, a 30-pack-year history of smoking could mean smoking 1 pack a day for 30 years or 2 packs a day for 15 years). Yearly screening should continue until the smoker has stopped smoking for at least 15 years. Yearly screening should be stopped for people who develop a health problem that would prevent them from having lung cancer treatment.  If you choose to drink alcohol, do not have more than 2 drinks per day. One drink is considered to be 12 oz (360 mL) of beer, 5 oz (150 mL) of wine, or 1.5 oz (45 mL) of liquor.  Avoid the use of street drugs. Do not share needles with anyone. Ask for help if you need support or instructions about stopping the use of drugs.  High blood pressure causes heart disease and increases the risk of stroke. Blood pressure should be checked at least every 1-2 years. Ongoing high blood pressure should be treated with medicines if weight loss and exercise are not effective.  If you are 45-79 years old, ask your health care provider if   you should take aspirin to prevent heart disease.  Diabetes screening involves taking a blood sample to check your fasting blood sugar level. This should be done once every 3 years after age 45 if you are at a normal weight and without risk factors for diabetes. Testing should be considered at a younger age or be carried out more frequently if you are overweight and have at least 1 risk factor for diabetes.  Colorectal cancer can be detected and often prevented. Most routine colorectal cancer screening begins at the age of 50  and continues through age 75. However, your health care provider may recommend screening at an earlier age if you have risk factors for colon cancer. On a yearly basis, your health care provider may provide home test kits to check for hidden blood in the stool. A small camera at the end of a tube may be used to directly examine the colon (sigmoidoscopy or colonoscopy) to detect the earliest forms of colorectal cancer. Talk to your health care provider about this at age 50 when routine screening begins. A direct exam of the colon should be repeated every 5-10 years through age 75, unless early forms of precancerous polyps or small growths are found.  People who are at an increased risk for hepatitis B should be screened for this virus. You are considered at high risk for hepatitis B if:  You were born in a country where hepatitis B occurs often. Talk with your health care provider about which countries are considered high risk.  Your parents were born in a high-risk country and you have not received a shot to protect against hepatitis B (hepatitis B vaccine).  You have HIV or AIDS.  You use needles to inject street drugs.  You live with, or have sex with, someone who has hepatitis B.  You are a man who has sex with other men (MSM).  You get hemodialysis treatment.  You take certain medicines for conditions like cancer, organ transplantation, and autoimmune conditions.  Hepatitis C blood testing is recommended for all people born from 1945 through 1965 and any individual with known risk factors for hepatitis C.  Healthy men should no longer receive prostate-specific antigen (PSA) blood tests as part of routine cancer screening. Talk to your health care provider about prostate cancer screening.  Testicular cancer screening is not recommended for adolescents or adult males who have no symptoms. Screening includes self-exam, a health care provider exam, and other screening tests. Consult with your  health care provider about any symptoms you have or any concerns you have about testicular cancer.  Practice safe sex. Use condoms and avoid high-risk sexual practices to reduce the spread of sexually transmitted infections (STIs).  You should be screened for STIs, including gonorrhea and chlamydia if:  You are sexually active and are younger than 24 years.  You are older than 24 years, and your health care provider tells you that you are at risk for this type of infection.  Your sexual activity has changed since you were last screened, and you are at an increased risk for chlamydia or gonorrhea. Ask your health care provider if you are at risk.  If you are at risk of being infected with HIV, it is recommended that you take a prescription medicine daily to prevent HIV infection. This is called pre-exposure prophylaxis (PrEP). You are considered at risk if:  You are a man who has sex with other men (MSM).  You are a heterosexual man who   is sexually active with multiple partners.  You take drugs by injection.  You are sexually active with a partner who has HIV.  Talk with your health care provider about whether you are at high risk of being infected with HIV. If you choose to begin PrEP, you should first be tested for HIV. You should then be tested every 3 months for as long as you are taking PrEP.  Use sunscreen. Apply sunscreen liberally and repeatedly throughout the day. You should seek shade when your shadow is shorter than you. Protect yourself by wearing long sleeves, pants, a wide-brimmed hat, and sunglasses year round whenever you are outdoors.  Tell your health care provider of new moles or changes in moles, especially if there is a change in shape or color. Also, tell your health care provider if a mole is larger than the size of a pencil eraser.  A one-time screening for abdominal aortic aneurysm (AAA) and surgical repair of large AAAs by ultrasound is recommended for men aged  65-75 years who are current or former smokers.  Stay current with your vaccines (immunizations). Document Released: 06/12/2008 Document Revised: 12/20/2013 Document Reviewed: 05/12/2011 Exit[employer] Patient Information 2015 [employer]. This information is not intended to replace advice given to you by your health care provider. Make sure you discuss any questions you have with your health care provider. Generalized Anxiety Disorder Generalized anxiety disorder (GAD) is a mental disorder. It interferes with life functions, including relationships, work, and school. GAD is different from normal anxiety, which everyone experiences at some point in their lives in response to specific life events and activities. Normal anxiety actually helps us prepare for and get through these life events and activities. Normal anxiety goes away after the event or activity is over.  GAD causes anxiety that is not necessarily related to specific events or activities. It also causes excess anxiety in proportion to specific events or activities. The anxiety associated with GAD is also difficult to control. GAD can vary from mild to severe. People with severe GAD can have intense waves of anxiety with physical symptoms (panic attacks).  SYMPTOMS The anxiety and worry associated with GAD are difficult to control. This anxiety and worry are related to many life events and activities and also occur more days than not for 6 months or longer. People with GAD also have three or more of the following symptoms (one or more in children):  Restlessness.   Fatigue.  Difficulty concentrating.   Irritability.  Muscle tension.  Difficulty sleeping or unsatisfying sleep. DIAGNOSIS GAD is diagnosed through an assessment by your health care provider. Your health care provider will ask you questions aboutyour mood,physical symptoms, and events in your life. Your health care provider may ask you about your medical history and use  of alcohol or drugs, including prescription medicines. Your health care provider may also do a physical exam and blood tests. Certain medical conditions and the use of certain substances can cause symptoms similar to those associated with GAD. Your health care provider may refer you to a mental health specialist for further evaluation. TREATMENT The following therapies are usually used to treat GAD:   Medication. Antidepressant medication usually is prescribed for long-term daily control. Antianxiety medicines may be added in severe cases, especially when panic attacks occur.   Talk therapy (psychotherapy). Certain types of talk therapy can be helpful in treating GAD by providing support, education, and guidance. A form of talk therapy called cognitive behavioral therapy can teach you   healthy ways to think about and react to daily life events and activities.  Stress managementtechniques. These include yoga, meditation, and exercise and can be very helpful when they are practiced regularly. A mental health specialist can help determine which treatment is best for you. Some people see improvement with one therapy. However, other people require a combination of therapies. Document Released: 04/11/2013 Document Revised: 05/01/2014 Document Reviewed: 04/11/2013 [facility] Patient Information 2015 [employer], [place]. This information is not intended to replace advice given to you by your health care provider. Make sure you discuss any questions you have with your health care provider. Benign Prostatic Hyperplasia An enlarged prostate (benign prostatic hyperplasia) is common in older men. You may experience the following:  Weak urine stream.  Dribbling.  Feeling like the bladder has not emptied completely.  Difficulty starting urination.  Getting up frequently at night to urinate.  Urinating more frequently during the day. HOME CARE INSTRUCTIONS  Monitor your prostatic hyperplasia for any changes.  The following actions may help to alleviate any discomfort you are experiencing:  Give yourself time when you urinate.  Stay away from alcohol.  Avoid beverages containing caffeine, such as coffee, tea, and colas, because they can make the problem worse.  Avoid decongestants, antihistamines, and some prescription medicines that can make the problem worse.  Follow up with your health care provider for further treatment as recommended. SEEK MEDICAL CARE IF:  You are experiencing progressive difficulty voiding.  Your urine stream is progressively getting narrower.  You are awaking from sleep with the urge to void more frequently.  You are constantly feeling the need to void.  You experience loss of urine, especially in small amounts. SEEK IMMEDIATE MEDICAL CARE IF:   You develop increased pain with urination or are unable to urinate.  You develop severe abdominal pain, vomiting, a high fever, or fainting.  You develop back pain or blood in your urine. MAKE SURE YOU:   Understand these instructions.  Will watch your condition.  Will get help right away if you are not doing well or get worse. Document Released: 12/15/2005 Document Revised: 08/17/2013 Document Reviewed: 05/17/2013 [facility] Patient Information 2015 [place], [place]. This information is not intended to replace advice given to you by your health care provider. Make sure you discuss any questions you have with your health care provider.

## 2015-02-09 LAB — LIPID PANEL
CHOLESTEROL TOTAL: 119 mg/dL (ref 100–199)
Chol/HDL Ratio: 2.9 ratio units (ref 0.0–5.0)
HDL: 41 mg/dL (ref >39)
LDL Calculated: 55 mg/dL (ref 0–99)
TRIGLYCERIDES: 115 mg/dL (ref 0–149)
VLDL CHOLESTEROL CAL: 23 mg/dL (ref 5–40)

## 2015-02-09 LAB — CMP14+EGFR
ALBUMIN: 4.5 g/dL (ref 3.5–4.8)
ALK PHOS: 101 IU/L (ref 39–117)
ALT: 22 IU/L (ref 0–44)
AST: 21 IU/L (ref 0–40)
Albumin/Globulin Ratio: 2.4 (ref 1.1–2.5)
BUN/Creatinine Ratio: 12 (ref 10–22)
BUN: 12 mg/dL (ref 8–27)
Bilirubin Total: 1.3 mg/dL — ABNORMAL HIGH (ref 0.0–1.2)
CALCIUM: 9.4 mg/dL (ref 8.6–10.2)
CO2: 24 mmol/L (ref 18–29)
CREATININE: 0.99 mg/dL (ref 0.76–1.27)
Chloride: 100 mmol/L (ref 97–108)
GFR calc Af Amer: 88 mL/min/{1.73_m2} (ref >59)
GFR calc non Af Amer: 76 mL/min/{1.73_m2} (ref >59)
GLOBULIN, TOTAL: 1.9 g/dL (ref 1.5–4.5)
Glucose: 129 mg/dL — ABNORMAL HIGH (ref 65–99)
Potassium: 4.5 mmol/L (ref 3.5–5.2)
Sodium: 141 mmol/L (ref 134–144)
TOTAL PROTEIN: 6.4 g/dL (ref 6.0–8.5)

## 2015-02-09 LAB — VITAMIN D 25 HYDROXY (VIT D DEFICIENCY, FRACTURES): VIT D 25 HYDROXY: 42.5 ng/mL (ref 30.0–100.0)

## 2015-02-20 ENCOUNTER — Other Ambulatory Visit: Payer: Self-pay | Admitting: Family

## 2015-02-20 ENCOUNTER — Other Ambulatory Visit: Payer: Self-pay | Admitting: Family Medicine

## 2015-03-29 ENCOUNTER — Other Ambulatory Visit: Payer: Self-pay | Admitting: *Deleted

## 2015-03-29 ENCOUNTER — Other Ambulatory Visit: Payer: Self-pay | Admitting: Family

## 2015-03-29 MED ORDER — ALBUTEROL SULFATE (2.5 MG/3ML) 0.083% IN NEBU: 2.5000 mg | INHALATION_SOLUTION | Freq: Four times a day (QID) | RESPIRATORY_TRACT | Status: DC | PRN

## 2015-05-02 ENCOUNTER — Ambulatory Visit: Payer: Self-pay | Admitting: *Deleted

## 2015-05-03 ENCOUNTER — Other Ambulatory Visit: Payer: Self-pay | Admitting: Family

## 2015-05-04 ENCOUNTER — Other Ambulatory Visit: Payer: Self-pay | Admitting: Family

## 2015-05-09 ENCOUNTER — Encounter: Payer: Self-pay | Admitting: Family

## 2015-05-09 ENCOUNTER — Ambulatory Visit (INDEPENDENT_AMBULATORY_CARE_PROVIDER_SITE_OTHER): Payer: Medicare Other | Admitting: Family

## 2015-05-09 VITALS — BP 157/87 | HR 71 | Temp 96.8°F | Ht 68.0 in | Wt 254.6 lb

## 2015-05-09 DIAGNOSIS — F419 Anxiety disorder, unspecified: Secondary | ICD-10-CM | POA: Diagnosis not present

## 2015-05-09 DIAGNOSIS — J449 Chronic obstructive pulmonary disease, unspecified: Secondary | ICD-10-CM

## 2015-05-09 DIAGNOSIS — Z23 Encounter for immunization: Secondary | ICD-10-CM | POA: Diagnosis not present

## 2015-05-09 DIAGNOSIS — E785 Hyperlipidemia, unspecified: Secondary | ICD-10-CM | POA: Diagnosis not present

## 2015-05-09 DIAGNOSIS — I10 Essential (primary) hypertension: Secondary | ICD-10-CM | POA: Diagnosis not present

## 2015-05-09 DIAGNOSIS — E559 Vitamin D deficiency, unspecified: Secondary | ICD-10-CM | POA: Diagnosis not present

## 2015-05-09 DIAGNOSIS — N400 Enlarged prostate without lower urinary tract symptoms: Secondary | ICD-10-CM | POA: Diagnosis not present

## 2015-05-09 DIAGNOSIS — E1165 Type 2 diabetes mellitus with hyperglycemia: Secondary | ICD-10-CM | POA: Diagnosis not present

## 2015-05-09 LAB — POCT GLYCOSYLATED HEMOGLOBIN (HGB A1C): HEMOGLOBIN A1C: 7

## 2015-05-09 NOTE — Patient Instructions (Signed)

## 2015-05-09 NOTE — Progress Notes (Signed)
Subjective:    Patient ID: Vincent Collins, male    DOB: 20-Nov-1941, 74 y.o.   MRN: [id number]  Diabetes He presents for his follow-up diabetic visit. He has type 2 diabetes mellitus. His disease course has been fluctuating. Hypoglycemia symptoms include nervousness/anxiousness. Pertinent negatives for hypoglycemia include no confusion, dizziness or headaches. Pertinent negatives for diabetes include no blurred vision, no chest pain, no foot paresthesias, no foot ulcerations and no visual change. Pertinent negatives for diabetic complications include no CVA, heart disease, nephropathy or peripheral neuropathy. Risk factors for coronary artery disease include diabetes mellitus, dyslipidemia, hypertension, male sex, obesity and family history. Current diabetic treatment includes oral agent (dual therapy). He is compliant with treatment all of the time. He is following a generally healthy diet. His breakfast blood glucose range is generally 110-130 mg/dl. An ACE inhibitor/angiotensin II receptor blocker is being taken. Eye exam is not current.  Hypertension This is a chronic problem. The current episode started more than 1 year ago. The problem has been waxing and waning since onset. The problem is uncontrolled (Pt states he takes his BP at home every morning and iit's avg 130's/80's). Associated symptoms include anxiety. Pertinent negatives include no blurred vision, chest pain, headaches, palpitations, peripheral edema or shortness of breath. Risk factors for coronary artery disease include diabetes mellitus, dyslipidemia, family history, male gender and obesity. Past treatments include ACE inhibitors and calcium channel blockers. The current treatment provides significant improvement. Compliance problems include exercise.  Hypertensive end-organ damage includes kidney disease. There is no history of CAD/MI, CVA, heart failure or a thyroid problem. There is no history of chronic renal disease.   Hyperlipidemia This is a chronic problem. The current episode started more than 1 year ago. The problem is controlled. Recent lipid tests were reviewed and are normal. Exacerbating diseases include diabetes and obesity. He has no history of chronic renal disease. Factors aggravating his hyperlipidemia include fatty foods. Pertinent negatives include no chest pain, leg pain, myalgias or shortness of breath. Current antihyperlipidemic treatment includes statins. The current treatment provides moderate improvement of lipids. Risk factors for coronary artery disease include dyslipidemia, family history, male sex, obesity, diabetes mellitus and a sedentary lifestyle.  Anxiety Presents for follow-up visit. Onset was 1 to 6 months ago. The problem has been waxing and waning. Symptoms include excessive worry, nausea, nervous/anxious behavior and panic. Patient reports no chest pain, confusion, depressed mood, dizziness, insomnia, irritability, palpitations or shortness of breath. Symptoms occur occasionally. The symptoms are aggravated by family issues.   His past medical history is significant for anxiety/panic attacks. There is no history of depression. Past treatments include benzodiazephines. The treatment provided mild relief. Compliance with prior treatments has been good.  Benign Prostatic Hypertrophy This is a chronic problem. The current episode started more than 1 year ago. The problem has been waxing and waning since onset. Irritative symptoms include frequency, nocturia and urgency. Associated symptoms include nausea and vomiting. Pertinent negatives include no hematuria. The symptoms are aggravated by diuretics. Past treatments include nothing.  COPD Pt currently taking Symbicort inhaler BID and using his nebulizer a "few times a week". Pt states his breathing is doing "good".     Review of Systems  Constitutional: Negative.  Negative for irritability.  HENT: Negative.   Eyes: Negative for  blurred vision.  Respiratory: Negative.  Negative for shortness of breath.   Cardiovascular: Negative.  Negative for chest pain and palpitations.  Gastrointestinal: Positive for nausea and vomiting.  Endocrine: Negative.  Genitourinary: Positive for urgency, frequency and nocturia. Negative for hematuria.  Musculoskeletal: Negative.  Negative for myalgias.  Neurological: Negative.  Negative for dizziness and headaches.  Hematological: Negative.   Psychiatric/Behavioral: Negative for confusion. The patient is nervous/anxious. The patient does not have insomnia.   All other systems reviewed and are negative.      Objective:   Physical Exam  Constitutional: He is oriented to person, place, and time. He appears well-developed and well-nourished. No distress.  HENT:  Head: Normocephalic.  Right Ear: External ear normal.  Left Ear: External ear normal.  Mouth/Throat: Oropharynx is clear and moist.  Eyes: Pupils are equal, round, and reactive to light. Right eye exhibits no discharge. Left eye exhibits no discharge.  Neck: Normal range of motion. Neck supple. No thyromegaly present.  Cardiovascular: Normal rate, regular rhythm, normal heart sounds and intact distal pulses.   No murmur heard. Pulmonary/Chest: Effort normal and breath sounds normal. No respiratory distress. He has no wheezes.  Abdominal: Soft. Bowel sounds are normal. He exhibits no distension. There is no tenderness.  Musculoskeletal: Normal range of motion. He exhibits no edema or tenderness.  Neurological: He is alert and oriented to person, place, and time. He has normal reflexes. No cranial nerve deficit.  Skin: Skin is warm and dry. No rash noted. No erythema.  Psychiatric: He has a normal mood and affect. His behavior is normal. Judgment and thought content normal.  Vitals reviewed.     BP 157/87 mmHg  Pulse 71  Temp(Src) 96.8 F (36 C) (Oral)  Ht $R'5\' 8"'iT$  (1.727 m)  Wt 254 lb 9.6 oz (115.486 kg)  BMI 38.72  kg/m2     Assessment & Plan:  1. Essential hypertension - CMP14+EGFR  2. Chronic obstructive pulmonary disease, unspecified COPD, unspecified chronic bronchitis type - CMP14+EGFR  3. Type 2 diabetes mellitus with hyperglycemia - POCT glycosylated hemoglobin (Hb A1C) - CMP14+EGFR  4. BPH (benign prostatic hyperplasia) - CMP14+EGFR  5. Anxiety - CMP14+EGFR  6. Hyperlipemia - CMP14+EGFR - Lipid panel  7. Vitamin D deficiency - CMP14+EGFR   Continue all meds Labs pending Health Maintenance reviewed-Prevnar vaccine given today Diet and exercise encouraged RTO 3 months   Evelina Dun, FNP

## 2015-05-10 ENCOUNTER — Telehealth: Payer: Self-pay | Admitting: *Deleted

## 2015-05-10 LAB — CMP14+EGFR
ALT: 27 IU/L (ref 0–44)
AST: 18 IU/L (ref 0–40)
Albumin/Globulin Ratio: 1.8 (ref 1.1–2.5)
Albumin: 4.2 g/dL (ref 3.5–4.8)
Alkaline Phosphatase: 105 IU/L (ref 39–117)
BUN / CREAT RATIO: 17 (ref 10–22)
BUN: 17 mg/dL (ref 8–27)
Bilirubin Total: 1.3 mg/dL — ABNORMAL HIGH (ref 0.0–1.2)
CO2: 25 mmol/L (ref 18–29)
CREATININE: 0.99 mg/dL (ref 0.76–1.27)
Calcium: 9.5 mg/dL (ref 8.6–10.2)
Chloride: 97 mmol/L (ref 97–108)
GFR calc non Af Amer: 76 mL/min/{1.73_m2} (ref >59)
GFR, EST AFRICAN AMERICAN: 88 mL/min/{1.73_m2} (ref >59)
Globulin, Total: 2.3 g/dL (ref 1.5–4.5)
Glucose: 165 mg/dL — ABNORMAL HIGH (ref 65–99)
Potassium: 4.3 mmol/L (ref 3.5–5.2)
SODIUM: 139 mmol/L (ref 134–144)
Total Protein: 6.5 g/dL (ref 6.0–8.5)

## 2015-05-10 LAB — LIPID PANEL
CHOL/HDL RATIO: 3 ratio (ref 0.0–5.0)
Cholesterol, Total: 120 mg/dL (ref 100–199)
HDL: 40 mg/dL (ref >39)
LDL Calculated: 54 mg/dL (ref 0–99)
Triglycerides: 128 mg/dL (ref 0–149)
VLDL Cholesterol Cal: 26 mg/dL (ref 5–40)

## 2015-05-10 NOTE — Telephone Encounter (Signed)
-----   Message from Junie Spencerhristy A Hawks, FNP sent at 05/10/2015 10:53 AM EDT ----- HgbA1C slightly elevated- Pt needs to be on low carb diet Kidney and liver function stable Cholesterol levels WNL

## 2015-05-23 ENCOUNTER — Other Ambulatory Visit: Payer: Self-pay | Admitting: Pharmacist

## 2015-06-05 ENCOUNTER — Other Ambulatory Visit: Payer: Self-pay | Admitting: Family

## 2015-08-02 ENCOUNTER — Other Ambulatory Visit: Payer: Self-pay | Admitting: Family

## 2015-08-07 ENCOUNTER — Ambulatory Visit (INDEPENDENT_AMBULATORY_CARE_PROVIDER_SITE_OTHER): Payer: Medicare Other | Admitting: Family

## 2015-08-07 ENCOUNTER — Encounter: Payer: Self-pay | Admitting: Family

## 2015-08-07 ENCOUNTER — Telehealth: Payer: Self-pay | Admitting: Family

## 2015-08-07 VITALS — BP 155/87 | HR 70 | Temp 97.6°F | Ht 68.0 in | Wt 251.0 lb

## 2015-08-07 DIAGNOSIS — M5441 Lumbago with sciatica, right side: Secondary | ICD-10-CM

## 2015-08-07 DIAGNOSIS — R300 Dysuria: Secondary | ICD-10-CM | POA: Diagnosis not present

## 2015-08-07 LAB — POCT URINALYSIS DIPSTICK
GLUCOSE UA: NEGATIVE
Ketones, UA: NEGATIVE
Leukocytes, UA: NEGATIVE
NITRITE UA: NEGATIVE
Urobilinogen, UA: NEGATIVE
pH, UA: 6

## 2015-08-07 LAB — POCT UA - MICROSCOPIC ONLY
Bacteria, U Microscopic: NEGATIVE
Casts, Ur, LPF, POC: NEGATIVE
Crystals, Ur, HPF, POC: NEGATIVE
Epithelial cells, urine per micros: NEGATIVE
WBC, UR, HPF, POC: NEGATIVE
Yeast, UA: NEGATIVE

## 2015-08-07 MED ORDER — CYCLOBENZAPRINE HCL 10 MG PO TABS: 10.0000 mg | ORAL_TABLET | Freq: Three times a day (TID) | ORAL | Status: DC | PRN

## 2015-08-07 MED ORDER — METHYLPREDNISOLONE ACETATE 80 MG/ML IJ SUSP
80.0000 mg | Freq: Once | INTRAMUSCULAR | Status: AC
  Administered 2015-08-07: 80 mg via INTRAMUSCULAR

## 2015-08-07 MED ORDER — KETOROLAC TROMETHAMINE 60 MG/2ML IM SOLN
30.0000 mg | Freq: Once | INTRAMUSCULAR | Status: AC
  Administered 2015-08-07: 30 mg via INTRAMUSCULAR

## 2015-08-07 MED ORDER — TRAMADOL HCL 50 MG PO TABS: 50.0000 mg | ORAL_TABLET | Freq: Three times a day (TID) | ORAL | Status: DC | PRN

## 2015-08-07 NOTE — Progress Notes (Signed)
   Subjective:    Patient ID: Vincent Collins, male    DOB: 1941-12-22, 74 y.o.   MRN: [id number]  Hip Pain  Associated symptoms include numbness and tingling.  Leg Pain  Associated symptoms include numbness and tingling.  Back Pain This is a new problem. The current episode started more than 1 month ago. The problem occurs constantly. The problem is unchanged. The pain is present in the gluteal. The quality of the pain is described as aching. The pain radiates to the right thigh. The pain is at a severity of 10/10. The pain is moderate. The symptoms are aggravated by lying down and bending. Associated symptoms include leg pain, numbness and tingling. Pertinent negatives include no bladder incontinence or bowel incontinence. He has tried NSAIDs for the symptoms. The treatment provided mild relief.     Review of Systems  Constitutional: Negative.   HENT: Negative.   Respiratory: Negative.   Cardiovascular: Negative.   Gastrointestinal: Negative.  Negative for bowel incontinence.  Endocrine: Negative.   Genitourinary: Negative.  Negative for bladder incontinence.  Musculoskeletal: Positive for back pain.  Neurological: Positive for tingling and numbness.  Hematological: Negative.   Psychiatric/Behavioral: Negative.   All other systems reviewed and are negative.      Objective:   Physical Exam  Constitutional: He is oriented to person, place, and time. He appears well-developed and well-nourished. No distress.  HENT:  Head: Normocephalic.  Eyes: Pupils are equal, round, and reactive to light. Right eye exhibits no discharge. Left eye exhibits no discharge.  Neck: Normal range of motion. Neck supple. No thyromegaly present.  Cardiovascular: Normal rate, regular rhythm, normal heart sounds and intact distal pulses.   No murmur heard. Pulmonary/Chest: Effort normal and breath sounds normal. No respiratory distress. He has no wheezes.  Abdominal: Soft. Bowel sounds are normal. He  exhibits no distension. There is no tenderness.  Musculoskeletal: Normal range of motion. He exhibits no edema or tenderness.  Neurological: He is alert and oriented to person, place, and time. He has normal reflexes. No cranial nerve deficit.  Skin: Skin is warm and dry. No rash noted. No erythema.  Psychiatric: He has a normal mood and affect. His behavior is normal. Judgment and thought content normal.  Vitals reviewed.     BP 155/87 mmHg  Pulse 70  Temp(Src) 97.6 F (36.4 C) (Oral)  Ht $R'5\' 8"'nz$  (1.727 m)  Wt 251 lb (113.853 kg)  BMI 38.17 kg/m2     Assessment & Plan:  1. Right-sided low back pain with right-sided sciatica -Rest -Ice as needed -Low carb diet- Told pt to expect increase in Blood glucose -Sedation precautions -RTO prn and keep ortho appt - methylPREDNISolone acetate (DEPO-MEDROL) injection 80 mg; Inject 1 mL (80 mg total) into the muscle once. - ketorolac (TORADOL) injection 30 mg; Inject 1 mL (30 mg total) into the muscle once. - cyclobenzaprine (FLEXERIL) 10 MG tablet; Take 1 tablet (10 mg total) by mouth 3 (three) times daily as needed for muscle spasms.  Dispense: 30 tablet; Refill: 0 - traMADol (ULTRAM) 50 MG tablet; Take 1 tablet (50 mg total) by mouth every 8 (eight) hours as needed.  Dispense: 30 tablet; Refill: 0 - BMP8+EGFR  Evelina Dun, FNP

## 2015-08-07 NOTE — Patient Instructions (Signed)
Sciatica Sciatica is pain, weakness, numbness, or tingling along the path of the sciatic nerve. The nerve starts in the lower back and runs down the back of each leg. The nerve controls the muscles in the lower leg and in the back of the knee, while also providing sensation to the back of the thigh, lower leg, and the sole of your foot. Sciatica is a symptom of another medical condition. For instance, nerve damage or certain conditions, such as a herniated disk or bone spur on the spine, pinch or put pressure on the sciatic nerve. This causes the pain, weakness, or other sensations normally associated with sciatica. Generally, sciatica only affects one side of the body. CAUSES   Herniated or slipped disc.  Degenerative disk disease.  A pain disorder involving the narrow muscle in the buttocks (piriformis syndrome).  Pelvic injury or fracture.  Pregnancy.  Tumor (rare). SYMPTOMS  Symptoms can vary from mild to very severe. The symptoms usually travel from the low back to the buttocks and down the back of the leg. Symptoms can include:  Mild tingling or dull aches in the lower back, leg, or hip.  Numbness in the back of the calf or sole of the foot.  Burning sensations in the lower back, leg, or hip.  Sharp pains in the lower back, leg, or hip.  Leg weakness.  Severe back pain inhibiting movement. These symptoms may get worse with coughing, sneezing, laughing, or prolonged sitting or standing. Also, being overweight may worsen symptoms. DIAGNOSIS  Your caregiver will perform a physical exam to look for common symptoms of sciatica. He or she may ask you to do certain movements or activities that would trigger sciatic nerve pain. Other tests may be performed to find the cause of the sciatica. These may include:  Blood tests.  X-rays.  Imaging tests, such as an MRI or CT scan. TREATMENT  Treatment is directed at the cause of the sciatic pain. Sometimes, treatment is not necessary  and the pain and discomfort goes away on its own. If treatment is needed, your caregiver may suggest:  Over-the-counter medicines to relieve pain.  Prescription medicines, such as anti-inflammatory medicine, muscle relaxants, or narcotics.  Applying heat or ice to the painful area.  Steroid injections to lessen pain, irritation, and inflammation around the nerve.  Reducing activity during periods of pain.  Exercising and stretching to strengthen your abdomen and improve flexibility of your spine. Your caregiver may suggest losing weight if the extra weight makes the back pain worse.  Physical therapy.  Surgery to eliminate what is pressing or pinching the nerve, such as a bone spur or part of a herniated disk. HOME CARE INSTRUCTIONS   Only take over-the-counter or prescription medicines for pain or discomfort as directed by your caregiver.  Apply ice to the affected area for 20 minutes, 3-4 times a day for the first 48-72 hours. Then try heat in the same way.  Exercise, stretch, or perform your usual activities if these do not aggravate your pain.  Attend physical therapy sessions as directed by your caregiver.  Keep all follow-up appointments as directed by your caregiver.  Do not wear high heels or shoes that do not provide proper support.  Check your mattress to see if it is too soft. A firm mattress may lessen your pain and discomfort. SEEK IMMEDIATE MEDICAL CARE IF:   You lose control of your bowel or bladder (incontinence).  You have increasing weakness in the lower back, pelvis, buttocks,   or legs.  You have redness or swelling of your back.  You have a burning sensation when you urinate.  You have pain that gets worse when you lie down or awakens you at night.  Your pain is worse than you have experienced in the past.  Your pain is lasting longer than 4 weeks.  You are suddenly losing weight without reason. MAKE SURE YOU:  Understand these  instructions.  Will watch your condition.  Will get help right away if you are not doing well or get worse. Document Released: 12/09/2001 Document Revised: 06/15/2012 Document Reviewed: 04/25/2012 Exit[employer] Patient Information 2015 [employer]. This information is not intended to replace advice given to you by your health care provider. Make sure you discuss any questions you have with your health care provider.  

## 2015-08-07 NOTE — Addendum Note (Signed)
Addended by: Orma Render F on: 08/07/2015 05:03 PM   Modules accepted: Orders

## 2015-08-07 NOTE — Telephone Encounter (Signed)
Pt has not been seen since May, provider would need to see him and evaluate pain to prescribe the correct medication. Pt is good with this and we were able to make an appt for today with Jannifer Rodney

## 2015-08-07 NOTE — Addendum Note (Signed)
Addended by: Orma Render F on: 08/07/2015 04:59 PM   Modules accepted: Orders

## 2015-08-09 ENCOUNTER — Ambulatory Visit: Payer: Medicare Other | Admitting: Family

## 2015-08-09 DIAGNOSIS — M545 Low back pain: Secondary | ICD-10-CM | POA: Diagnosis not present

## 2015-08-09 LAB — URINE CULTURE: ORGANISM ID, BACTERIA: NO GROWTH

## 2015-08-10 ENCOUNTER — Encounter: Payer: Self-pay | Admitting: Family

## 2015-08-20 ENCOUNTER — Other Ambulatory Visit: Payer: Self-pay | Admitting: Family

## 2015-08-21 ENCOUNTER — Other Ambulatory Visit: Payer: Self-pay | Admitting: *Deleted

## 2015-08-21 ENCOUNTER — Other Ambulatory Visit: Payer: Self-pay

## 2015-08-21 DIAGNOSIS — N400 Enlarged prostate without lower urinary tract symptoms: Secondary | ICD-10-CM

## 2015-08-21 DIAGNOSIS — M5441 Lumbago with sciatica, right side: Secondary | ICD-10-CM

## 2015-08-21 MED ORDER — TRAMADOL HCL 50 MG PO TABS: 50.0000 mg | ORAL_TABLET | Freq: Three times a day (TID) | ORAL | Status: DC | PRN

## 2015-08-21 MED ORDER — TAMSULOSIN HCL 0.4 MG PO CAPS: 0.4000 mg | ORAL_CAPSULE | Freq: Every day | ORAL | Status: DC

## 2015-08-21 MED ORDER — CYCLOBENZAPRINE HCL 10 MG PO TABS: 10.0000 mg | ORAL_TABLET | Freq: Three times a day (TID) | ORAL | Status: DC | PRN

## 2015-08-21 NOTE — Telephone Encounter (Signed)
Patient aware that rx is ready to be picked up.  

## 2015-08-21 NOTE — Telephone Encounter (Signed)
RX ready for pick up 

## 2015-08-21 NOTE — Telephone Encounter (Signed)
These were both filled 08/07/15, for a 10 day supply. Only 30 given

## 2015-08-22 ENCOUNTER — Ambulatory Visit: Payer: Medicare Other | Attending: Orthopaedic Surgery | Admitting: Physical Therapy

## 2015-08-22 DIAGNOSIS — M545 Low back pain: Secondary | ICD-10-CM | POA: Diagnosis not present

## 2015-08-22 NOTE — Therapy (Signed)
[facility] [address] [place], [place], [address] Phone: [phone]   Fax:  [phone]  Physical Therapy Evaluation  Patient Details  Name: Vincent Collins MRN: [id number] Date of Birth: [birth year] Referring Provider:  Eldred Manges, MD  Encounter Date: 08/22/2015      PT End of Session - 08/22/15 1903    Visit Number 1   Number of Visits 12   Date for PT Re-Evaluation 10/03/15   PT Start Time 0900   PT Stop Time 0946   PT Time Calculation (min) 46 min   Activity Tolerance Patient tolerated treatment well   Behavior During Therapy [facility] for tasks assessed/performed      Past Medical History  Diagnosis Date  . Hypertension   . Hyperlipidemia   . COPD (chronic obstructive pulmonary disease)   . Diverticulitis   . Diabetes mellitus without complication   . Hydronephrosis of left kidney   . Left ureteral stone     obstructing  . Renal calculus or stone 2015    Past Surgical History  Procedure Laterality Date  . Spine surgery    . Lumbar disc surgery    . 2d echo  04/04/09  . Cystoscopy with retrograde pyelogram, ureteroscopy and stent placement Left 10/11/2014    Procedure: CYSTOSCOPY WITH RETROGRADE PYELOGRAM, DIAGNOSTIC URETEROSCOPY AND STENT PLACEMENT;  Surgeon: [name], MD;  Location: [facility];  Service: Urology;  Laterality: Left;    There were no vitals filed for this visit.  Visit Diagnosis:  Right low back pain, with sciatica presence unspecified - Plan: PT plan of care cert/re-cert      Subjective Assessment - 08/22/15 0940    Subjective (p) I took pain medication this morning so my paain is down.   Limitations (p) Standing   How long can you stand comfortably? (p) 10-15 minutes.   Patient Stated Goals (p) Get out of pain and do stuff around house like before.   Currently in Pain? (p) Yes   Pain Score (p) 6    Pain Location (p) Back   Pain Orientation (p) Right   Pain Descriptors  / Indicators (p) Throbbing;Constant   Pain Type (p) --  Sub-acute.   Pain Onset (p) More than a month ago   Pain Frequency (p) Constant            [facility] PT Assessment - 08/22/15 0001    Assessment   Medical Diagnosis Low back pain.   Onset Date/Surgical Date --  May 2016   Precautions   Precautions None   Restrictions   Weight Bearing Restrictions No   Balance Screen   Has the patient fallen in the past 6 months No   Home Environment   Living Environment Private residence   Prior Function   Level of Independence Independent   Posture/Postural Control   Posture Comments the patient stands in 15 degrees of lumbar fllexiion.   ROM / Strength   AROM / PROM / Strength AROM;Strength   AROM   Overall AROM Comments Active lumbar flexion limited by 75% and extension to 0 degrees.   Strength   Overall Strength Comments --  Normal bilateral LE strength.   Palpation   Palpation comment --  CC is referred pain into right buttock an dposterior thigh.   Special Tests    Special Tests Lumbar;Leg LengthTest   Lumbar Tests --  + RT SLR.  0+/4+ Achilles reflexes.   Leg length test  --  Equal leg lengths.   Ambulation/Gait   Gait Comments Antalgic gait.                   [facility] Adult PT Treatment/Exercise - 08/23/15 0001    Modalities   Modalities Electrical Stimulation   Electrical Stimulation   Electrical Stimulation Location Constant pre mod e stim x 15 minutes to right low back and upper gluteal region                  PT Short Term Goals - Aug 23, 2015 1904    PT SHORT TERM GOAL #1   Title Ind with HEP.   Time 2   Period Weeks   Status New           PT Long Term Goals - 08/23/15 1904    PT LONG TERM GOAL #1   Title Eliminate right LE symptoms.   Time 6   Period Weeks   Status New   PT LONG TERM GOAL #2   Title Stand 15 minutes with pain not > 3/10.   Time 6   Period Weeks   Status New   PT LONG TERM GOAL #3   Title Perform ADL's with  pain not > 3/10.   Time 6   Period Weeks   Status New               Plan - Aug 23, 2015 1856    Clinical Impression Statement Patient reports a major flare-up of low back pain while doing strnuous work at home.  His pain has been as high as a 10/10 with radiation of pain into his right buttock and and posterior thigh.   Pt will benefit from skilled therapeutic intervention in order to improve on the following deficits Pain;Decreased activity tolerance   Rehab Potential Good   PT Frequency 2x / week   PT Duration 6 weeks   PT Treatment/Interventions Electrical Stimulation;Traction;Ultrasound;Therapeutic exercise;Patient/family education;ADLs/Self Care Home Management;Manual techniques   PT Next Visit Plan Int traction at 40% bodyweight; Modalties and core exercises.   Consulted and Agree with Plan of Care Patient          G-Codes - 08-23-2015 1908    Functional Assessment Tool Used FOTO.   Functional Limitation Mobility: Walking and moving around   Mobility: Walking and Moving Around Current Status [phone]) At least 60 percent but less than 80 percent impaired, limited or restricted   Mobility: Walking and Moving Around Goal Status [phone]) At least 20 percent but less than 40 percent impaired, limited or restricted       Problem List Patient Active Problem List   Diagnosis Date Noted  . Acute kidney injury 09/21/2014  . Hydronephrosis of left kidney 09/21/2014  . Left ureteral stone 09/21/2014  . Angioedema 09/20/2014  . HTN (hypertension) 04/02/2011  . Anxiety 04/02/2011  . Diverticular disease 04/02/2011  . Gastritis 04/02/2011  . COPD (chronic obstructive pulmonary disease) 04/02/2011  . BPH (benign prostatic hyperplasia) 04/02/2011  . Hyperlipemia 04/02/2011  . Vitamin D deficiency 04/02/2011  . Type 2 diabetes mellitus 04/02/2011    Vincent Collins, [place] MPT 08-23-2015, 7:09 PM  [facility] [address] [place], [place],  [address] Phone: [phone]   Fax:  [phone]

## 2015-08-29 ENCOUNTER — Ambulatory Visit: Payer: Medicare Other | Admitting: Physical Therapy

## 2015-08-29 ENCOUNTER — Encounter: Payer: Self-pay | Admitting: Physical Therapy

## 2015-08-29 DIAGNOSIS — M545 Low back pain: Secondary | ICD-10-CM

## 2015-08-29 NOTE — Patient Instructions (Signed)
Pelvic Tilt: Posterior - Legs Bent (Supine)   Tighten stomach and flatten back by rolling pelvis down. Hold _10___ seconds. Relax. Repeat _10-30___ times per set. Do __2__ sets per session. Do _2___ sessions per day.   . Straight Leg Raise   Tighten stomach and slowly raise locked right leg __4__ inches from floor. Repeat __10-30__ times per set. Do __2__ sets per session. Do __2__ sessions per day.    Self-Mobilization: Heel Slide (Supine)   Hold draw in and Slide left/Right heel toward buttocks until a gentle stretch is felt. Hold _5___ seconds. Relax. Repeat _10___ times per set. Do _2-3___ sets per session. Do _2___ sessions per day.  Bent Leg Lift (Hook-Lying)   Tighten stomach and slowly raise right leg _5___ inches from floor. Keep trunk rigid. Hold _3___ seconds. Repeat _10___ times per set. Do ___2-3_ sets per session. Do __2__ sessions per day.    

## 2015-08-29 NOTE — Therapy (Signed)
[facility] [place], [place], [address] Phone: [phone]   Fax:  [phone]  Physical Therapy Treatment  Patient Details  Name: Vincent Collins MRN: [id number] Date of Birth: [birth year] Referring Provider:  Sharion Balloon, FNP  Encounter Date: 08/29/2015      PT End of Session - 08/29/15 0826    Visit Number 2   Number of Visits 12   Date for PT Re-Evaluation 10/03/15   PT Start Time 0813   PT Stop Time 0908   PT Time Calculation (min) 55 min   Activity Tolerance Patient tolerated treatment well   Behavior During Therapy [facility] for tasks assessed/performed      Past Medical History  Diagnosis Date  . Hypertension   . Hyperlipidemia   . COPD (chronic obstructive pulmonary disease)   . Diverticulitis   . Diabetes mellitus without complication   . Hydronephrosis of left kidney   . Left ureteral stone     obstructing  . Renal calculus or stone 2015    Past Surgical History  Procedure Laterality Date  . Spine surgery    . Lumbar disc surgery    . 2d echo  04/04/09  . Cystoscopy with retrograde pyelogram, ureteroscopy and stent placement Left 10/11/2014    Procedure: CYSTOSCOPY WITH RETROGRADE PYELOGRAM, DIAGNOSTIC URETEROSCOPY AND STENT PLACEMENT;  Surgeon: Alexis Frock, MD;  Location: [facility];  Service: Urology;  Laterality: Left;    There were no vitals filed for this visit.  Visit Diagnosis:  Right low back pain, with sciatica presence unspecified      Subjective Assessment - 08/29/15 0819    Subjective Patient felt much better after last treatment and did not have to take meds   Limitations Standing   How long can you stand comfortably? 10-36min   Patient Stated Goals Get out of pain and do stuff around house like before   Currently in Pain? Yes   Pain Score 2    Pain Location Back   Pain Orientation Right   Pain Descriptors / Indicators Sore;Radiating   Pain Type Acute pain   Pain Radiating Towards right buttock and thigh   Pain Onset More than a month ago   Pain Frequency Constant   Aggravating Factors  standing   Pain Relieving Factors rest                         [facility] Adult PT Treatment/Exercise - 08/29/15 0001    Exercises   Exercises Lumbar   Lumbar Exercises: Supine   Ab Set 20 reps   Heel Slides 20 reps   Bent Knee Raise 20 reps;3 seconds   Straight Leg Raise 20 reps;3 seconds   Modalities   Modalities Electrical Stimulation;Traction   Electrical Stimulation   Electrical Stimulation Location back   Electrical Stimulation Action premod   Electrical Stimulation Parameters 80-150Hz    Electrical Stimulation Goals Pain   Traction   Type of Traction Lumbar   Min (lbs) 5   Max (lbs) 98   Hold Time 99   Rest Time 5   Time 15                PT Education - 08/29/15 0826    Education provided Yes   Education Details HEP / Posture awareness techniques   Person(s) Educated Patient   Methods Explanation;Demonstration;Handout   Comprehension Verbalized understanding;Returned demonstration          PT  Short Term Goals - 08/29/15 6151    PT SHORT TERM GOAL #1   Title Ind with HEP.   Time 2   Period Weeks   Status Achieved           PT Long Term Goals - 08/29/15 8343    PT LONG TERM GOAL #1   Title Eliminate right LE symptoms.   Time 6   Period Weeks   Status On-going   PT LONG TERM GOAL #2   Title Stand 15 minutes with pain not > 3/10.   Time 6   Period Weeks   Status On-going   PT LONG TERM GOAL #3   Title Perform ADL's with pain not > 3/10.   Time 6   Period Weeks   Status On-going               Plan - 08/29/15 0840    Clinical Impression Statement Patient has responed well to treatment thus far and has reported less pain overall. HEP given for core activation techniques and educated patient on posture awareness techniques with ADL's, lifting, bending, power lift, and sleeping to protect  back. Started traction @ 40% per MPT and patient tolerated very well. Met STG #1 today other LTG's ongoing due to pain deficits.   Pt will benefit from skilled therapeutic intervention in order to improve on the following deficits Pain;Decreased activity tolerance   Rehab Potential Good   PT Frequency 2x / week   PT Duration 6 weeks   PT Treatment/Interventions Electrical Stimulation;Traction;Ultrasound;Therapeutic exercise;Patient/family education;ADLs/Self Care Home Management;Manual techniques   PT Next Visit Plan cont with POC progress per patient tolerance   Consulted and Agree with Plan of Care Patient        Problem List Patient Active Problem List   Diagnosis Date Noted  . Acute kidney injury 09/21/2014  . Hydronephrosis of left kidney 09/21/2014  . Left ureteral stone 09/21/2014  . Angioedema 09/20/2014  . HTN (hypertension) 04/02/2011  . Anxiety 04/02/2011  . Diverticular disease 04/02/2011  . Gastritis 04/02/2011  . COPD (chronic obstructive pulmonary disease) 04/02/2011  . BPH (benign prostatic hyperplasia) 04/02/2011  . Hyperlipemia 04/02/2011  . Vitamin D deficiency 04/02/2011  . Type 2 diabetes mellitus 04/02/2011    Phillips Climes, PTA 08/29/2015, 9:09 AM  [facility] [place], [place], [address] Phone: [phone]   Fax:  [phone]

## 2015-09-05 ENCOUNTER — Encounter: Payer: Self-pay | Admitting: Physical Therapy

## 2015-09-05 ENCOUNTER — Ambulatory Visit: Payer: Medicare Other | Attending: Orthopaedic Surgery | Admitting: Physical Therapy

## 2015-09-05 DIAGNOSIS — M545 Low back pain: Secondary | ICD-10-CM | POA: Insufficient documentation

## 2015-09-05 NOTE — Therapy (Signed)
[facility] [address] [place], [place], [address] Phone: [phone]   Fax:  [phone]  Physical Therapy Treatment  Patient Details  Name: Vincent Collins MRN: [id number] Date of Birth: 18-Aug-1941 Referring Provider:  Junie Spencer, FNP  Encounter Date: 09/05/2015      PT End of Session - 09/05/15 0826    Visit Number 3   Number of Visits 12   Date for PT Re-Evaluation 10/03/15   PT Start Time 0814   PT Stop Time 0856   PT Time Calculation (min) 42 min   Activity Tolerance Patient tolerated treatment well   Behavior During Therapy [facility] for tasks assessed/performed      Past Medical History  Diagnosis Date  . Hypertension   . Hyperlipidemia   . COPD (chronic obstructive pulmonary disease)   . Diverticulitis   . Diabetes mellitus without complication   . Hydronephrosis of left kidney   . Left ureteral stone     obstructing  . Renal calculus or stone 2015    Past Surgical History  Procedure Laterality Date  . Spine surgery    . Lumbar disc surgery    . 2d echo  04/04/09  . Cystoscopy with retrograde pyelogram, ureteroscopy and stent placement Left 10/11/2014    Procedure: CYSTOSCOPY WITH RETROGRADE PYELOGRAM, DIAGNOSTIC URETEROSCOPY AND STENT PLACEMENT;  Surgeon: [name], MD;  Location: [facility];  Service: Urology;  Laterality: Left;    There were no vitals filed for this visit.  Visit Diagnosis:  Right low back pain, with sciatica presence unspecified      Subjective Assessment - 09/05/15 0819    Subjective Patient reported feeling so much better after last treatment and has no pain only some soreness in right low back area   Limitations Standing   How long can you stand comfortably? 10-81min   Patient Stated Goals Get out of pain and do stuff around house like before   Currently in Pain? Yes   Pain Score 1    Pain Location Back   Pain Orientation Right   Pain Descriptors / Indicators  Sore   Pain Type Acute pain   Pain Radiating Towards no radiating pain today   Pain Onset More than a month ago   Pain Frequency Intermittent   Aggravating Factors  standing   Pain Relieving Factors therapy, rest                         [facility] Adult PT Treatment/Exercise - 09/05/15 0001    Lumbar Exercises: Supine   Ab Set 20 reps   Bent Knee Raise 20 reps;3 seconds   Bridge 10 reps   Straight Leg Raise 20 reps;3 seconds   Electrical Stimulation   Electrical Stimulation Location back   Electrical Stimulation Action premod   Electrical Stimulation Parameters 80-150hz    Electrical Stimulation Goals Pain   Traction   Type of Traction Lumbar   Min (lbs) 5   Max (lbs) 100   Hold Time 99   Rest Time 5   Time 15                  PT Short Term Goals - 08/29/15 0836    PT SHORT TERM GOAL #1   Title Ind with HEP.   Time 2   Period Weeks   Status Achieved           PT Long Term Goals - 08/29/15 1324  PT LONG TERM GOAL #1   Title Eliminate right LE symptoms.   Time 6   Period Weeks   Status On-going   PT LONG TERM GOAL #2   Title Stand 15 minutes with pain not > 3/10.   Time 6   Period Weeks   Status On-going   PT LONG TERM GOAL #3   Title Perform ADL's with pain not > 3/10.   Time 6   Period Weeks   Status On-going               Plan - 09/05/15 1610    Clinical Impression Statement Patient continues to respond to treatment and has no right LE pain today. Patient reports 1/10 soreness in right low back and is very pleased with progress thus far. Patient has reported being up at work and standing for longer amount of time with no pain. Patient reports doing HEP and today reviewed with good technique.Continued to progress patient per tolerance with posture and core strength and lumbar traction increase to by 2# (100#) Patient progressing  and close to meeting LTG if radiating pain relief is consistant. Goals ongoing due to soreness  deficit   Pt will benefit from skilled therapeutic intervention in order to improve on the following deficits Pain;Decreased activity tolerance   Rehab Potential Good   PT Frequency 2x / week   PT Duration 6 weeks   PT Treatment/Interventions Electrical Stimulation;Traction;Ultrasound;Therapeutic exercise;Patient/family education;ADLs/Self Care Home Management;Manual techniques   PT Next Visit Plan cont with POC progress per patient tolerance   Consulted and Agree with Plan of Care Patient        Problem List Patient Active Problem List   Diagnosis Date Noted  . Acute kidney injury 09/21/2014  . Hydronephrosis of left kidney 09/21/2014  . Left ureteral stone 09/21/2014  . Angioedema 09/20/2014  . HTN (hypertension) 04/02/2011  . Anxiety 04/02/2011  . Diverticular disease 04/02/2011  . Gastritis 04/02/2011  . COPD (chronic obstructive pulmonary disease) 04/02/2011  . BPH (benign prostatic hyperplasia) 04/02/2011  . Hyperlipemia 04/02/2011  . Vitamin D deficiency 04/02/2011  . Type 2 diabetes mellitus 04/02/2011   Cathie Hoops, PTA 09/05/2015 8:59 AM   Alhaji Mcneal, Maryruth Bun, PTA 09/05/2015, 8:59 AM  [facility] [address] [place], [place], [address] Phone: [phone]   Fax:  [phone]

## 2015-09-06 DIAGNOSIS — M545 Low back pain: Secondary | ICD-10-CM | POA: Diagnosis not present

## 2015-09-12 ENCOUNTER — Ambulatory Visit: Payer: Medicare Other | Admitting: Physical Therapy

## 2015-09-12 ENCOUNTER — Encounter: Payer: Self-pay | Admitting: Physical Therapy

## 2015-09-12 DIAGNOSIS — M545 Low back pain: Secondary | ICD-10-CM | POA: Diagnosis not present

## 2015-09-12 NOTE — Patient Instructions (Signed)
  Scapular Retraction: Bilateral   Facing anchor, pull arms back, bringing shoulder blades together. Repeat _30___ times per set. Do __1-2__ sets per session. Do _1-2___ sessions per day. Backward Bend (Standing)   Arch backward to make hollow of back deeper. Hold ___3_ seconds. Repeat _10___ times per set. Do _2___ sets per session. Do __1-2__ sessions per day.

## 2015-09-12 NOTE — Therapy (Signed)
[facility] [place], [place], [address] Phone: [phone]   Fax:  [phone]  Physical Therapy Treatment  Patient Details  Name: Vincent Collins MRN: [id number] Date of Birth: November 04, 1941 Referring Provider:  Sharion Balloon, FNP  Encounter Date: 09/12/2015      PT End of Session - 09/12/15 0826    Visit Number 4   Number of Visits 12   Date for PT Re-Evaluation 10/03/15   PT Start Time 0812   PT Stop Time 0859   PT Time Calculation (min) 47 min   Activity Tolerance Patient tolerated treatment well   Behavior During Therapy [facility] for tasks assessed/performed      Past Medical History  Diagnosis Date  . Hypertension   . Hyperlipidemia   . COPD (chronic obstructive pulmonary disease)   . Diverticulitis   . Diabetes mellitus without complication   . Hydronephrosis of left kidney   . Left ureteral stone     obstructing  . Renal calculus or stone 2015    Past Surgical History  Procedure Laterality Date  . Spine surgery    . Lumbar disc surgery    . 2d echo  04/04/09  . Cystoscopy with retrograde pyelogram, ureteroscopy and stent placement Left 10/11/2014    Procedure: CYSTOSCOPY WITH RETROGRADE PYELOGRAM, DIAGNOSTIC URETEROSCOPY AND STENT PLACEMENT;  Surgeon: Alexis Frock, MD;  Location: [facility];  Service: Urology;  Laterality: Left;    There were no vitals filed for this visit.  Visit Diagnosis:  Right low back pain, with sciatica presence unspecified      Subjective Assessment - 09/12/15 0818    Subjective Patient has reported continued relief from therapy and would like to be on hold after today. No pain only very minor soreness in right low back   Limitations Standing   How long can you stand comfortably? 10-59min   Patient Stated Goals Get out of pain and do stuff around house like before   Currently in Pain? No/denies                         [facility] Adult PT  Treatment/Exercise - 09/12/15 0001    Lumbar Exercises: Standing   Other Standing Lumbar Exercises standing ext 2x10   Other Standing Lumbar Exercises scap retractions 2x10 with red t-band   Lumbar Exercises: Supine   Ab Set --   Bent Knee Raise 20 reps;3 seconds   Bridge 20 reps   Straight Leg Raise 20 reps;3 seconds   Electrical Stimulation   Electrical Stimulation Location back   Electrical Stimulation Action premod   Electrical Stimulation Parameters 80-150hz    Electrical Stimulation Goals Pain   Traction   Type of Traction Lumbar   Min (lbs) 5   Max (lbs) 100   Hold Time 99   Rest Time 5   Time 15                PT Education - 09/12/15 0826    Education provided Yes   Education Details HEP   Person(s) Educated Patient   Methods Explanation;Demonstration;Handout   Comprehension Verbalized understanding;Returned demonstration          PT Short Term Goals - 08/29/15 0836    PT SHORT TERM GOAL #1   Title Ind with HEP.   Time 2   Period Weeks   Status Achieved           PT Long Term Goals -  09/12/15 0829    PT LONG TERM GOAL #1   Title Eliminate right LE symptoms.   Time 6   Period Weeks   Status Achieved   PT LONG TERM GOAL #2   Title Stand 15 minutes with pain not > 3/10.   Time 6   Status Achieved   PT LONG TERM GOAL #3   Title Perform ADL's with pain not > 3/10.   Time 6   Period Weeks   Status Achieved               Plan - 09/12/15 0829    Clinical Impression Statement Patient has continued to respond well to treatment with reports of no pain and no symptoms in right LE, only some soreness in right low back around 1-2/10 at most. Patient has essentially met all current goals and patient would like to be put on hold as per his Dr. agreed with letting him be released and come back if there is any flare up. HEP given and educated patient with continued safety and posture to prevent re-injury. FOTO 34% limitation ( initial 76%)   Pt  will benefit from skilled therapeutic intervention in order to improve on the following deficits Pain;Decreased activity tolerance   Rehab Potential Good   PT Frequency 2x / week   PT Duration 6 weeks   PT Treatment/Interventions Electrical Stimulation;Traction;Ultrasound;Therapeutic exercise;Patient/family education;ADLs/Self Care Home Management;Manual techniques   PT Next Visit Plan on hold per patient/MD   Consulted and Agree with Plan of Care Patient        Problem List Patient Active Problem List   Diagnosis Date Noted  . Acute kidney injury 09/21/2014  . Hydronephrosis of left kidney 09/21/2014  . Left ureteral stone 09/21/2014  . Angioedema 09/20/2014  . HTN (hypertension) 04/02/2011  . Anxiety 04/02/2011  . Diverticular disease 04/02/2011  . Gastritis 04/02/2011  . COPD (chronic obstructive pulmonary disease) 04/02/2011  . BPH (benign prostatic hyperplasia) 04/02/2011  . Hyperlipemia 04/02/2011  . Vitamin D deficiency 04/02/2011  . Type 2 diabetes mellitus 04/02/2011    Cailyn Houdek P, PTA 09/12/2015, 9:20 AM  [facility] [address] [place], [place], [address] Phone: [phone]   Fax:  [phone]

## 2015-09-17 ENCOUNTER — Telehealth: Payer: Self-pay | Admitting: Family

## 2015-09-27 ENCOUNTER — Emergency Department (HOSPITAL_COMMUNITY): Payer: Medicare Other

## 2015-09-27 ENCOUNTER — Encounter (HOSPITAL_COMMUNITY): Payer: Self-pay | Admitting: *Deleted

## 2015-09-27 ENCOUNTER — Emergency Department (HOSPITAL_COMMUNITY)
Admission: EM | Admit: 2015-09-27 | Discharge: 2015-09-27 | Disposition: A | Discharge status: Home / Self Care | Payer: Medicare Other | Attending: Emergency Medicine | Admitting: Emergency Medicine

## 2015-09-27 DIAGNOSIS — I10 Essential (primary) hypertension: Secondary | ICD-10-CM | POA: Diagnosis not present

## 2015-09-27 DIAGNOSIS — Z79899 Other long term (current) drug therapy: Secondary | ICD-10-CM | POA: Insufficient documentation

## 2015-09-27 DIAGNOSIS — K5900 Constipation, unspecified: Secondary | ICD-10-CM

## 2015-09-27 DIAGNOSIS — R140 Abdominal distension (gaseous): Secondary | ICD-10-CM

## 2015-09-27 DIAGNOSIS — Z7951 Long term (current) use of inhaled steroids: Secondary | ICD-10-CM | POA: Insufficient documentation

## 2015-09-27 DIAGNOSIS — M549 Dorsalgia, unspecified: Secondary | ICD-10-CM | POA: Insufficient documentation

## 2015-09-27 DIAGNOSIS — E669 Obesity, unspecified: Secondary | ICD-10-CM | POA: Diagnosis not present

## 2015-09-27 DIAGNOSIS — J449 Chronic obstructive pulmonary disease, unspecified: Secondary | ICD-10-CM | POA: Diagnosis not present

## 2015-09-27 DIAGNOSIS — E785 Hyperlipidemia, unspecified: Secondary | ICD-10-CM | POA: Insufficient documentation

## 2015-09-27 DIAGNOSIS — Z87891 Personal history of nicotine dependence: Secondary | ICD-10-CM | POA: Diagnosis not present

## 2015-09-27 DIAGNOSIS — Z7982 Long term (current) use of aspirin: Secondary | ICD-10-CM | POA: Diagnosis not present

## 2015-09-27 DIAGNOSIS — E119 Type 2 diabetes mellitus without complications: Secondary | ICD-10-CM | POA: Diagnosis not present

## 2015-09-27 DIAGNOSIS — R110 Nausea: Secondary | ICD-10-CM | POA: Insufficient documentation

## 2015-09-27 DIAGNOSIS — Z87442 Personal history of urinary calculi: Secondary | ICD-10-CM | POA: Diagnosis not present

## 2015-09-27 DIAGNOSIS — R0789 Other chest pain: Secondary | ICD-10-CM

## 2015-09-27 DIAGNOSIS — R079 Chest pain, unspecified: Secondary | ICD-10-CM | POA: Diagnosis present

## 2015-09-27 DIAGNOSIS — R0602 Shortness of breath: Secondary | ICD-10-CM | POA: Diagnosis not present

## 2015-09-27 LAB — BASIC METABOLIC PANEL
Anion gap: 10 (ref 5–15)
BUN: 14 mg/dL (ref 6–20)
CALCIUM: 9.2 mg/dL (ref 8.9–10.3)
CHLORIDE: 103 mmol/L (ref 101–111)
CO2: 24 mmol/L (ref 22–32)
Creatinine, Ser: 0.96 mg/dL (ref 0.61–1.24)
Glucose, Bld: 146 mg/dL — ABNORMAL HIGH (ref 65–99)
Potassium: 4.1 mmol/L (ref 3.5–5.1)
Sodium: 137 mmol/L (ref 135–145)

## 2015-09-27 LAB — CBC
HEMATOCRIT: 48.3 % (ref 39.0–52.0)
HEMOGLOBIN: 16.9 g/dL (ref 13.0–17.0)
MCH: 30.6 pg (ref 26.0–34.0)
MCHC: 35 g/dL (ref 30.0–36.0)
MCV: 87.5 fL (ref 78.0–100.0)
Platelets: 153 10*3/uL (ref 150–400)
RBC: 5.52 MIL/uL (ref 4.22–5.81)
RDW: 13.6 % (ref 11.5–15.5)
WBC: 11 10*3/uL — AB (ref 4.0–10.5)

## 2015-09-27 LAB — HEPATIC FUNCTION PANEL
ALT: 24 U/L (ref 17–63)
AST: 22 U/L (ref 15–41)
Albumin: 3.7 g/dL (ref 3.5–5.0)
Alkaline Phosphatase: 92 U/L (ref 38–126)
BILIRUBIN DIRECT: 0.3 mg/dL (ref 0.1–0.5)
BILIRUBIN TOTAL: 1.5 mg/dL — AB (ref 0.3–1.2)
Indirect Bilirubin: 1.2 mg/dL — ABNORMAL HIGH (ref 0.3–0.9)
Total Protein: 6.2 g/dL — ABNORMAL LOW (ref 6.5–8.1)

## 2015-09-27 LAB — BRAIN NATRIURETIC PEPTIDE: B NATRIURETIC PEPTIDE 5: 39.6 pg/mL (ref 0.0–100.0)

## 2015-09-27 LAB — LIPASE, BLOOD: LIPASE: 25 U/L (ref 22–51)

## 2015-09-27 LAB — TROPONIN I

## 2015-09-27 LAB — D-DIMER, QUANTITATIVE (NOT AT ARMC)

## 2015-09-27 IMAGING — DX DG ABDOMEN 1V
1 series · 1 of 1 positions shown · non-contrast
Comparison: None.

CLINICAL DATA: Bloating and constipation for 3 days. History of
diverticulitis, diabetes, LEFT kidney stones.

EXAM:
ABDOMEN - 1 VIEW

[abdomen kub]
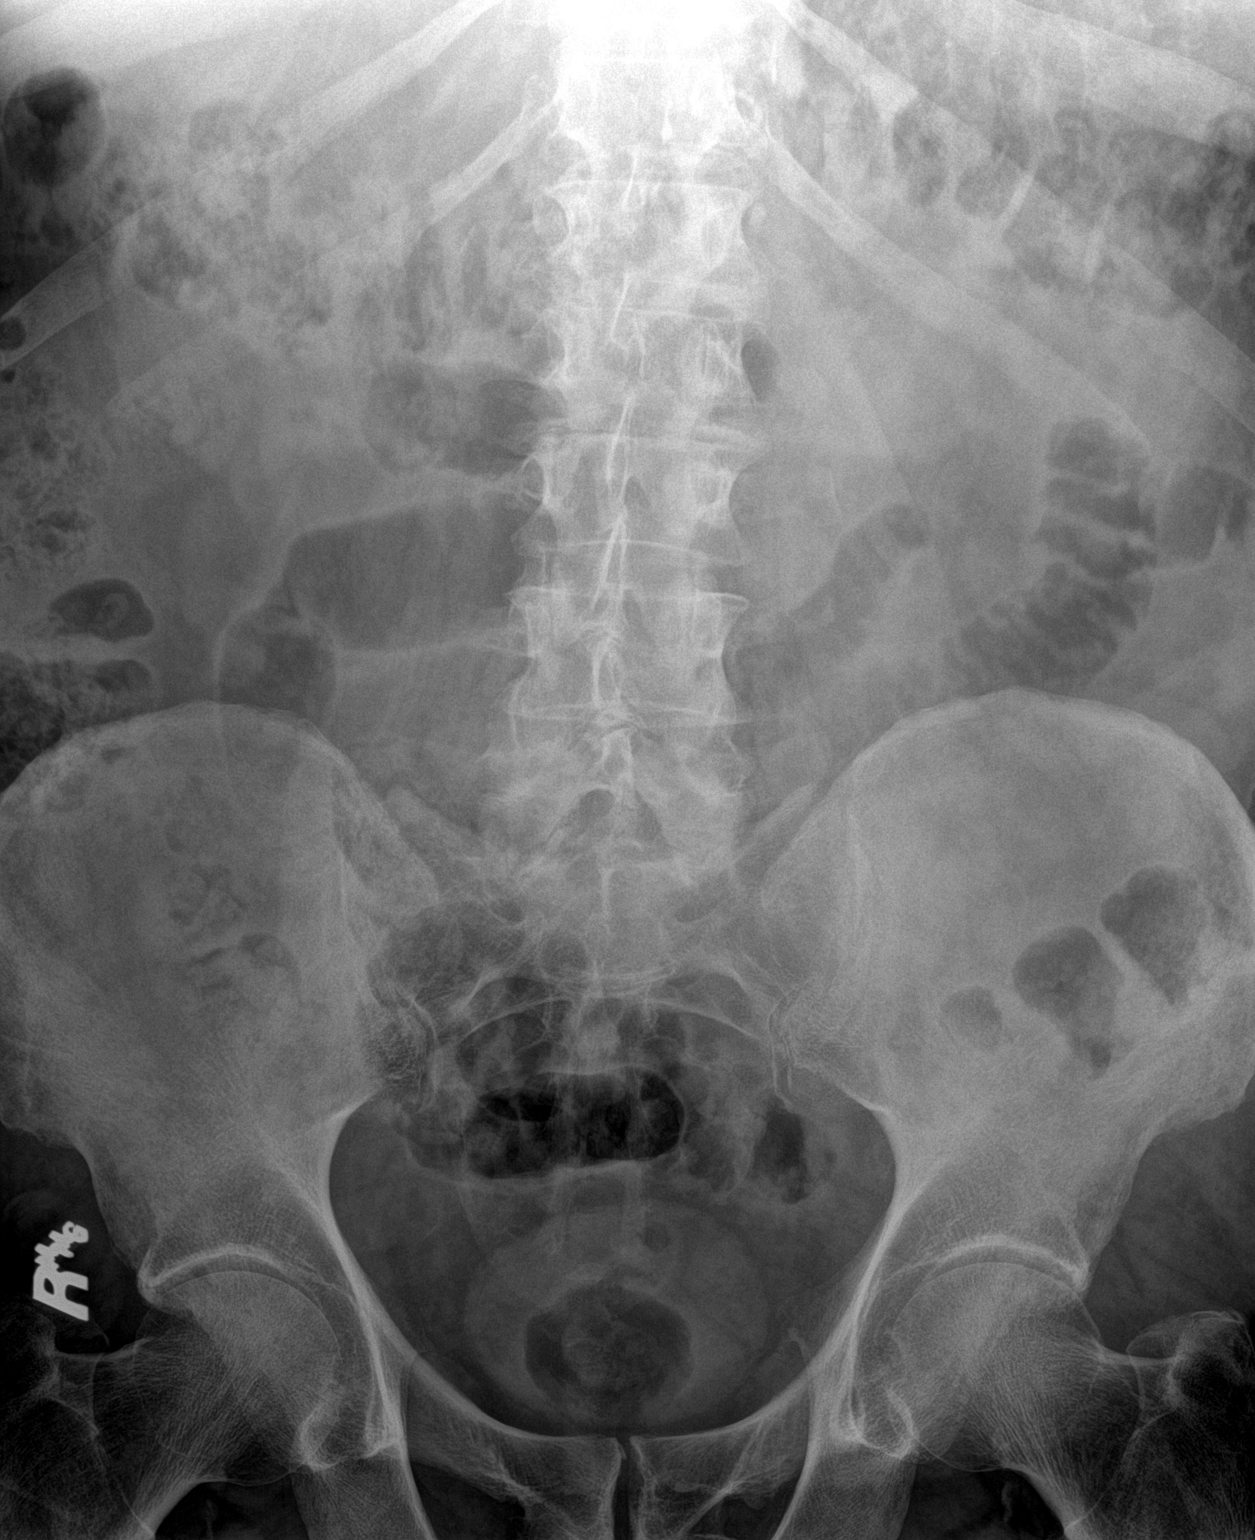

[1 of 1 positions shown; findings below may reference images not displayed]

FINDINGS: The bowel gas pattern is normal. Mild amount of retained large bowel
stool. No radio-opaque calculi or other significant radiographic
abnormality are seen.
IMPRESSION: Normal bowel gas pattern. Mild amount of retained large bowel stool.

## 2015-09-27 IMAGING — DX DG CHEST 2V
2 series · 2 of 2 positions shown · non-contrast
Comparison: None.

CLINICAL DATA: Chest and back pain with shortness of breath and
nausea tonight.

EXAM:
CHEST  2 VIEW

[chest pa]
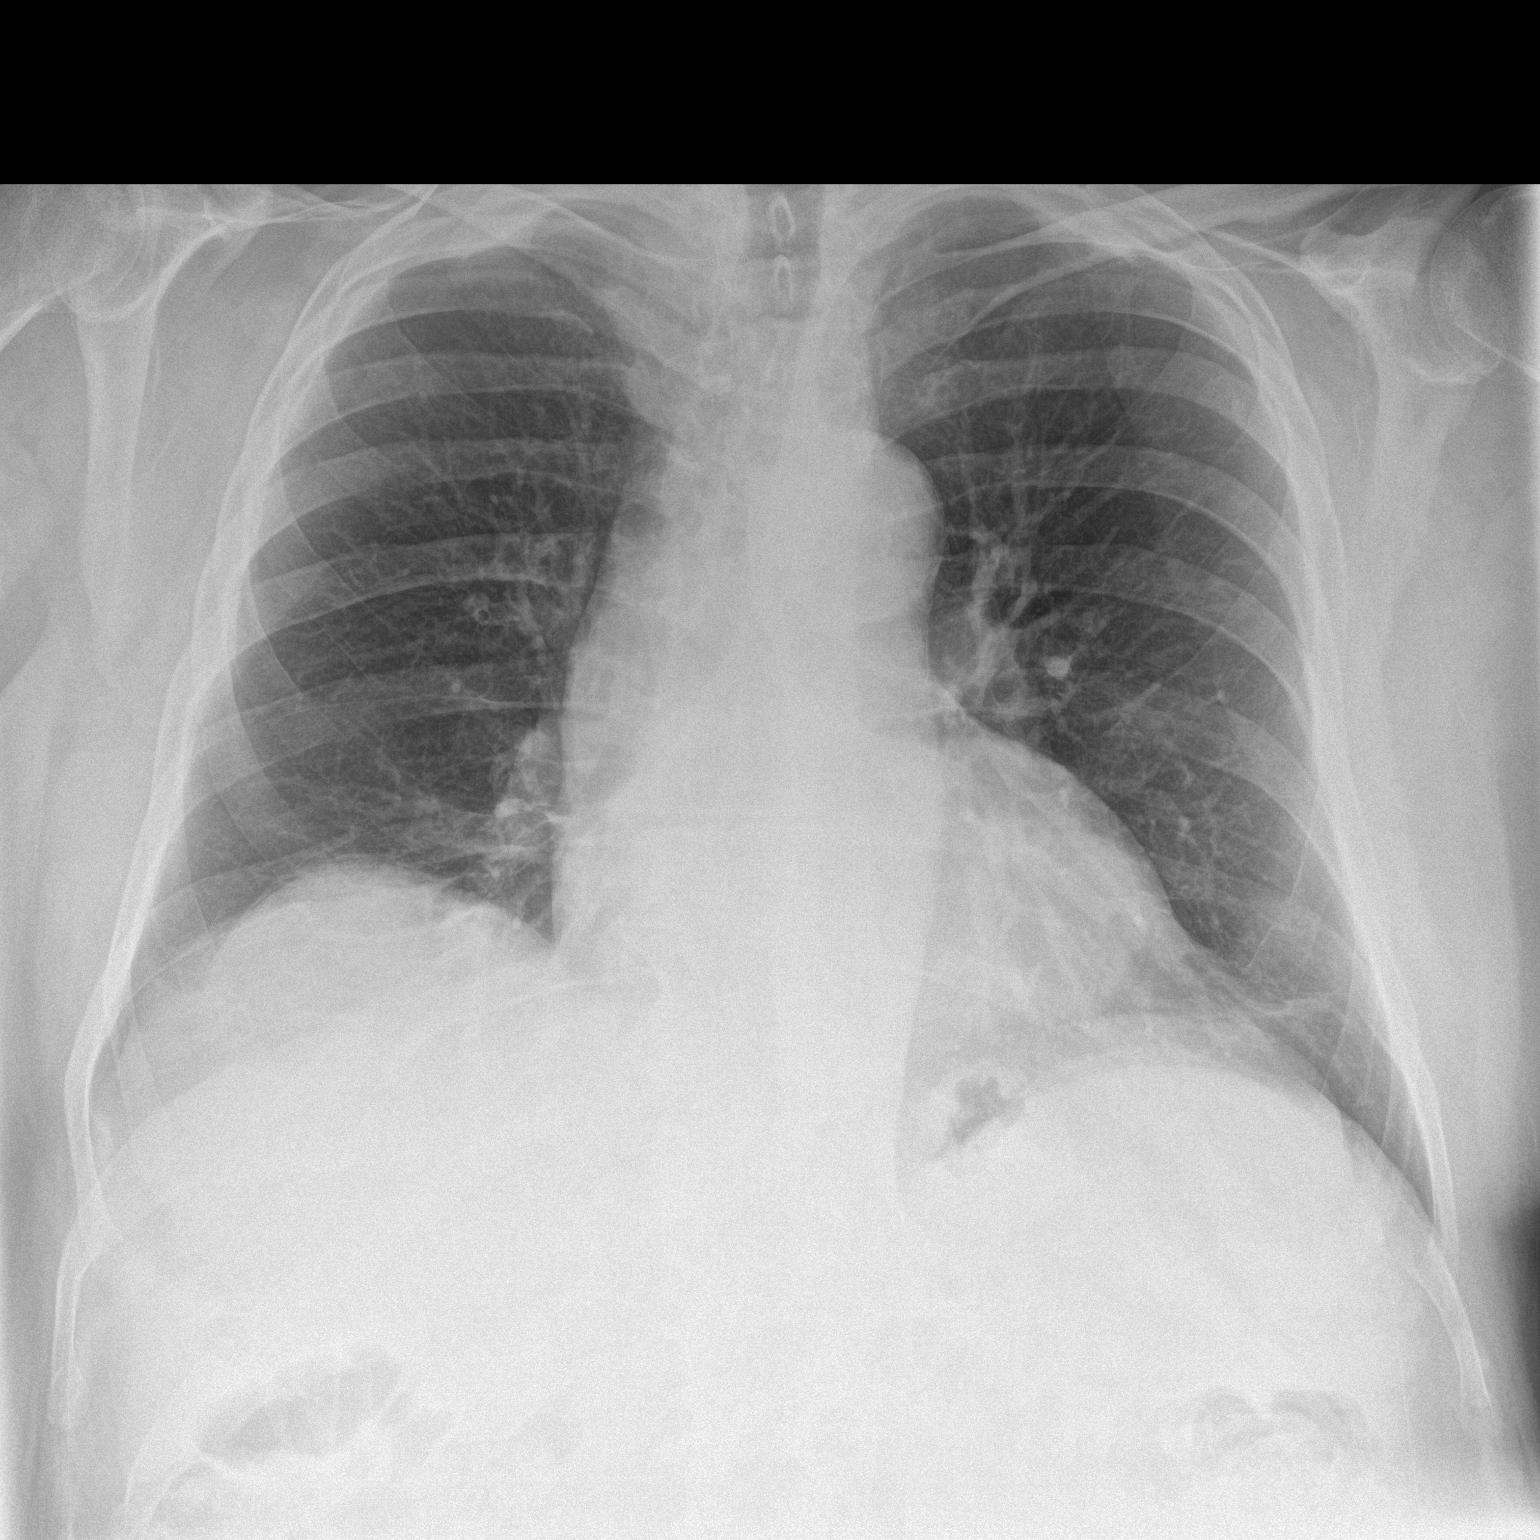

[chest lat]
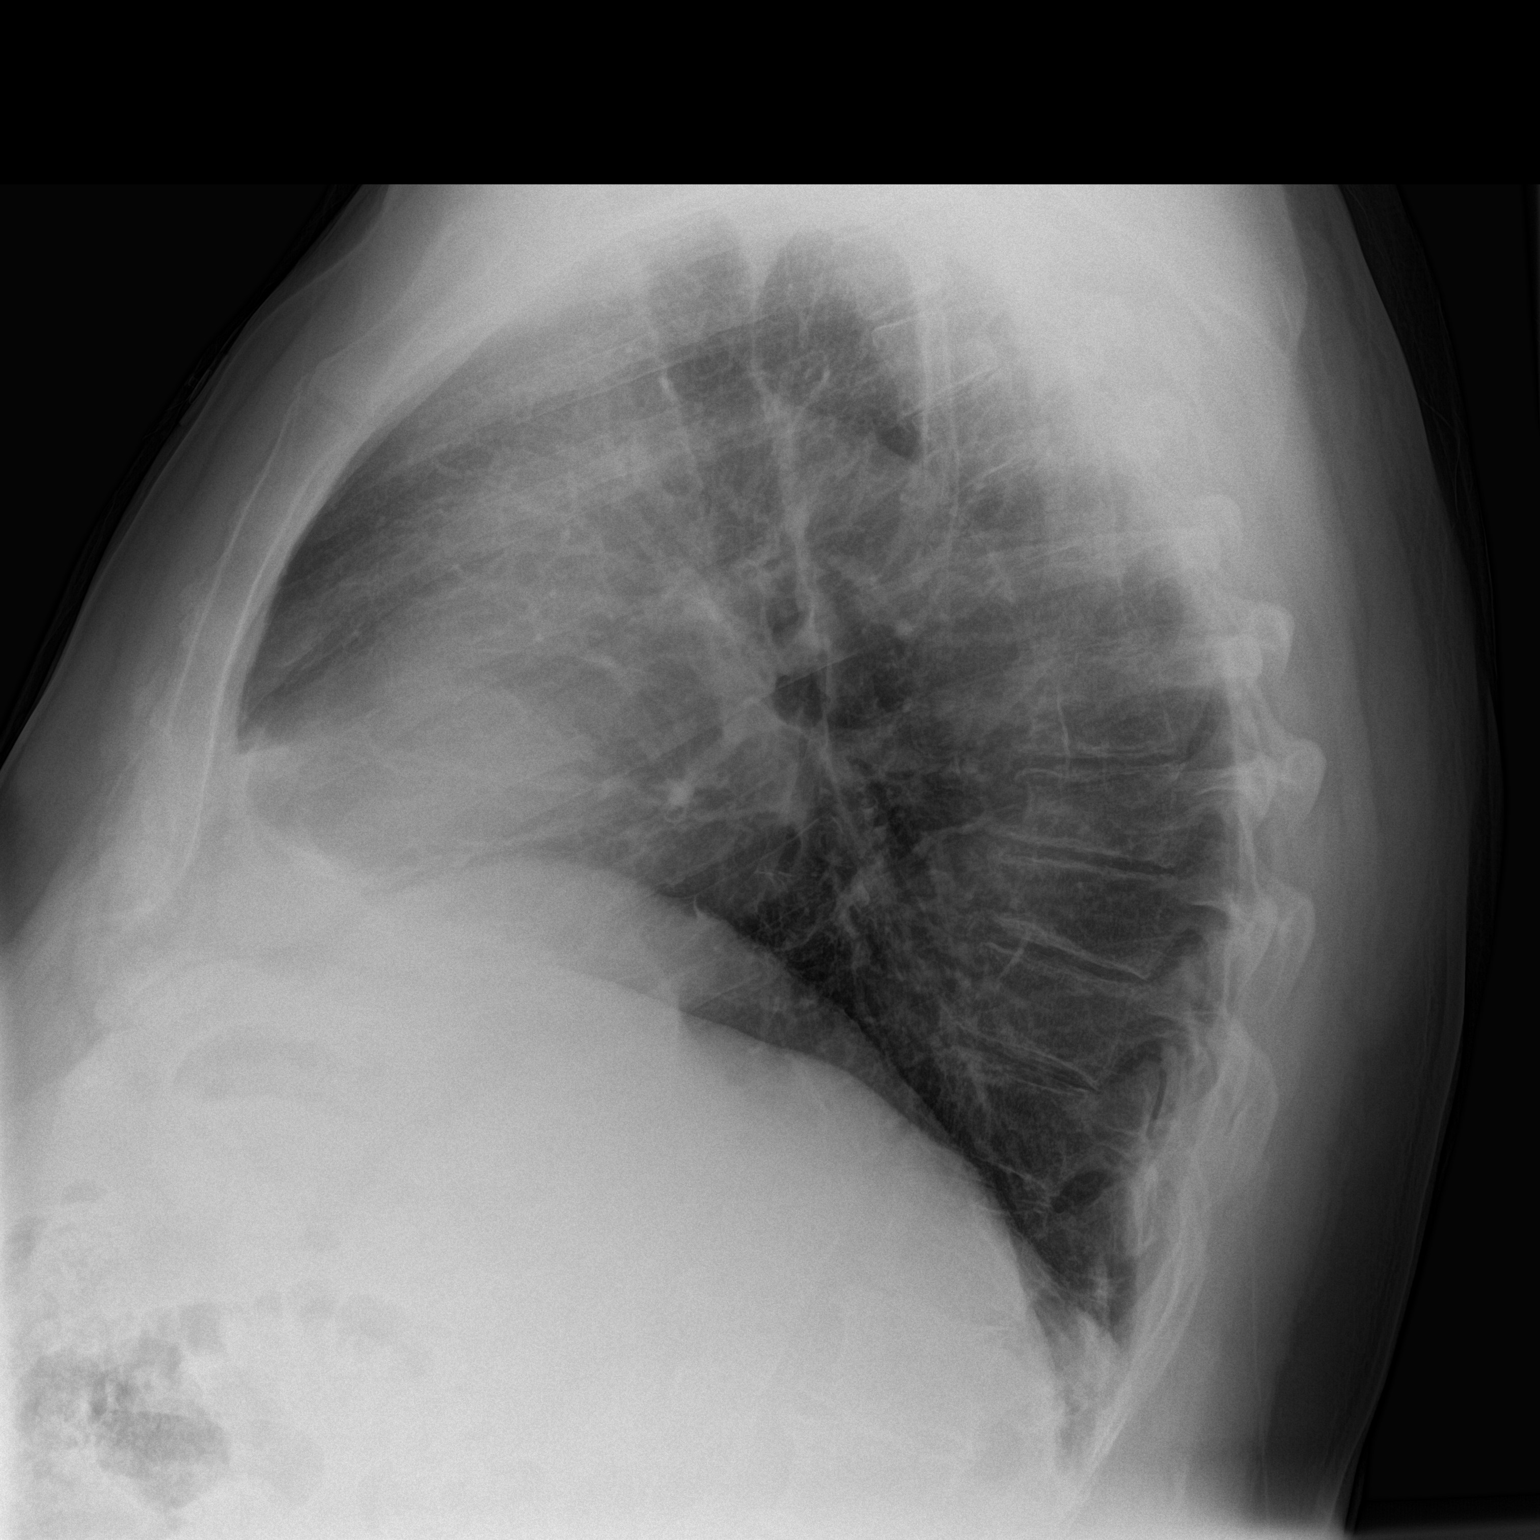

[2 of 2 positions shown; findings below may reference images not displayed]

FINDINGS: Mild elevation of right hemidiaphragm. The heart is normal in size,
mediastinal contours are normal. Minimal subsegmental atelectasis or
scarring at the left lung base. No consolidation to suggest
pneumonia. No pulmonary edema, pleural effusion or pneumothorax. No
acute osseous abnormalities seen. Degenerative changes seen in the
midthoracic spine.
IMPRESSION: Minimal subsegmental atelectasis or scarring at the left lung base.
There is otherwise no acute process.

## 2015-09-27 MED ORDER — POLYETHYLENE GLYCOL 3350 17 GM/SCOOP PO POWD: ORAL | Status: DC

## 2015-09-27 MED ORDER — ONDANSETRON HCL 4 MG/2ML IJ SOLN
4.0000 mg | Freq: Once | INTRAMUSCULAR | Status: AC
  Administered 2015-09-27: 4 mg via INTRAVENOUS
  Filled 2015-09-27: qty 2

## 2015-09-27 MED ORDER — MORPHINE SULFATE (PF) 4 MG/ML IV SOLN
4.0000 mg | Freq: Once | INTRAVENOUS | Status: AC
  Administered 2015-09-27: 4 mg via INTRAVENOUS
  Filled 2015-09-27: qty 1

## 2015-09-27 MED ORDER — OMEPRAZOLE 20 MG PO CPDR: 20.0000 mg | DELAYED_RELEASE_CAPSULE | Freq: Every day | ORAL | Status: DC

## 2015-09-27 MED ORDER — GI COCKTAIL ~~LOC~~
30.0000 mL | Freq: Once | ORAL | Status: AC
  Administered 2015-09-27: 30 mL via ORAL
  Filled 2015-09-27: qty 30

## 2015-09-27 MED ORDER — DICYCLOMINE HCL 10 MG PO CAPS
10.0000 mg | ORAL_CAPSULE | Freq: Once | ORAL | Status: AC
  Administered 2015-09-27: 10 mg via ORAL
  Filled 2015-09-27: qty 1

## 2015-09-27 MED ORDER — PANTOPRAZOLE SODIUM 40 MG IV SOLR
40.0000 mg | Freq: Once | INTRAVENOUS | Status: AC
  Administered 2015-09-27: 40 mg via INTRAVENOUS
  Filled 2015-09-27: qty 40

## 2015-09-27 NOTE — ED Notes (Signed)
The pt has had chest pain and sob since last pm with nausea.  No previous history

## 2015-09-27 NOTE — ED Provider Notes (Signed)
CSN: [id number]     Arrival date & time 09/27/15  0005 History  By signing my name below, I, Doreatha Martin, attest that this documentation has been prepared under the direction and in the presence of Shon Baton, MD. Electronically Signed: Doreatha Martin, ED Scribe. 09/27/2015. 12:47 AM.     Chief Complaint  Patient presents with  . Chest Pain   The history is provided by the patient. No language interpreter was used.   HPI Comments: Vincent Collins is a 74 y.o. male with hx of HTN, HLD, COPD, diverticulitis, DM who presents to the Emergency Department complaining of moderate, constant 3/10 CP that radiates to the upper back onset yesterday morning at 2AM. He states associated epigastric abdominal distention and tightness, nausea, intermittent abdominal pain, cough, increased belching.    He reports that chest pain is relieved when he lays flat. He notes that his back pain is worsened with ambulation. Denies of bowel or bladder dysfunction. Does report constipation. Unknown when his last normal bowel movement was. Reports abdominal bloating. Pt has taken ASA today. No hx of CHF/CAD. Pt is a non-smoker and quit when he was 74yo. No recent hospitalizations, illnesses, prolonged immobilization, recent travel. He denies recent fever, leg swelling, vomiting.   Past Medical History  Diagnosis Date  . Hypertension   . Hyperlipidemia   . COPD (chronic obstructive pulmonary disease)   . Diverticulitis   . Diabetes mellitus without complication   . Hydronephrosis of left kidney   . Left ureteral stone     obstructing  . Renal calculus or stone 2015   Past Surgical History  Procedure Laterality Date  . Spine surgery    . Lumbar disc surgery    . 2d echo  04/04/09  . Cystoscopy with retrograde pyelogram, ureteroscopy and stent placement Left 10/11/2014    Procedure: CYSTOSCOPY WITH RETROGRADE PYELOGRAM, DIAGNOSTIC URETEROSCOPY AND STENT PLACEMENT;  Surgeon: [name], MD;  Location: [facility];  Service: Urology;  Laterality: Left;   Family History  Problem Relation Age of Onset  . Early death Father     MI age 77   Social History  Substance Use Topics  . Smoking status: Former Games developer  . Smokeless tobacco: Former Neurosurgeon    Types: Chew  . Alcohol Use: No    Review of Systems  Constitutional: Negative for fever.  Respiratory: Negative for cough.   Cardiovascular: Positive for chest pain. Negative for leg swelling.  Gastrointestinal: Positive for nausea, abdominal pain and abdominal distention. Negative for vomiting.  Musculoskeletal: Positive for back pain.  All other systems reviewed and are negative.  Allergies  Ace inhibitors and Diphenhydramine hcl  Home Medications   Prior to Admission medications   Medication Sig Start Date End Date Taking? Authorizing Provider  albuterol (PROVENTIL) (2.5 MG/3ML) 0.083% nebulizer solution NEBULIZE 1 VIAL FOUR TIMES DAILY AS NEEDED FOR SHORTNESS OF BREATH ORWHEEZING 08/02/15  Yes Junie Spencer, FNP  aspirin 81 MG EC tablet Take 81 mg by mouth daily.     Yes Historical Provider, MD  atorvastatin (LIPITOR) 40 MG tablet TAKE 1 TABLET DAILY 08/02/15  Yes Junie Spencer, FNP  Cholecalciferol (VITAMIN D) 1000 UNITS capsule Take 1,000 Units by mouth 2 (two) times daily.    Yes Historical Provider, MD  famotidine (PEPCID) 20 MG tablet Take 1 tablet (20 mg total) by mouth 2 (two) times daily. 02/20/15  Yes Junie Spencer, FNP  fish oil-omega-3 fatty acids 1000 MG capsule  Take 1-2 g by mouth 2 (two) times daily. Patient takes 2g in the morning and 1g at night   Yes Historical Provider, MD  metFORMIN (GLUCOPHAGE-XR) 500 MG 24 hr tablet TAKE 1 TABLET ONCE DAILY WITH LARGEST MEAL. 05/23/15  Yes Junie Spencer, FNP  metoprolol succinate (TOPROL-XL) 50 MG 24 hr tablet Take 50 mg by mouth daily. Take with or immediately following a meal. 11/02/14  Yes Tammy Eckard, PHARMD  PROAIR HFA 108 (90 BASE) MCG/ACT inhaler 2 PUFFS EVERY 6 HOURS  AS NEEDED FOR WHEEZING 06/06/15  Yes Junie Spencer, FNP  SYMBICORT 80-4.5 MCG/ACT inhaler Inhale 2 puffs into the lungs 2 (two) times daily. 05/04/15  Yes Junie Spencer, FNP  tamsulosin (FLOMAX) 0.4 MG CAPS capsule Take 1 capsule (0.4 mg total) by mouth daily. 08/21/15  Yes Junie Spencer, FNP  ACCU-CHEK AVIVA PLUS test strip  11/03/14   Historical Provider, MD  ACCU-CHEK SOFTCLIX LANCETS lancets  11/03/14   Historical Provider, MD  ALPRAZolam Prudy Feeler) 0.5 MG tablet Take 1 tablet (0.5 mg total) by mouth 2 (two) times daily. Patient not taking: Reported on 09/27/2015 02/08/15   Junie Spencer, FNP  cyclobenzaprine (FLEXERIL) 10 MG tablet Take 1 tablet (10 mg total) by mouth 3 (three) times daily as needed for muscle spasms. Patient not taking: Reported on 09/27/2015 08/21/15   Junie Spencer, FNP  meclizine (ANTIVERT) 25 MG tablet Take 1 tablet (25 mg total) by mouth 3 (three) times daily as needed for dizziness. Patient not taking: Reported on 05/09/2015 02/08/15   Junie Spencer, FNP  omeprazole (PRILOSEC) 20 MG capsule Take 1 capsule (20 mg total) by mouth daily. 09/27/15   Shon Baton, MD  polyethylene glycol powder (GLYCOLAX/MIRALAX) powder Take one capful by mouth twice daily until stools are loose. 09/27/15   Shon Baton, MD  traMADol (ULTRAM) 50 MG tablet Take 1 tablet (50 mg total) by mouth every 8 (eight) hours as needed. Patient not taking: Reported on 09/27/2015 08/21/15   Junie Spencer, FNP   BP 126/65 mmHg  Pulse 83  Temp(Src) 97.8 F (36.6 C)  Resp 13  Ht 5\' 10"  (1.778 m)  SpO2 91% Physical Exam  Constitutional: He is oriented to person, place, and time. He appears well-developed and well-nourished.  Elderly, overweight  HENT:  Head: Normocephalic and atraumatic.  Cardiovascular: Normal rate, regular rhythm and normal heart sounds.   No murmur heard. Pulmonary/Chest: Effort normal and breath sounds normal. No respiratory distress. He has no wheezes. He has no rales.   Abdominal: Soft. Bowel sounds are normal. There is no tenderness. There is no rebound and no guarding.  Musculoskeletal: He exhibits no edema.  Neurological: He is alert and oriented to person, place, and time.  Skin: Skin is warm and dry.  Psychiatric: He has a normal mood and affect.  Nursing note and vitals reviewed.   ED Course  Procedures (including critical care time) DIAGNOSTIC STUDIES: Oxygen Saturation is 96% on RA, adequate by my interpretation.    COORDINATION OF CARE: 12:45 AM Discussed treatment plan with pt at bedside and pt agreed to plan.   Labs Review Labs Reviewed  BASIC METABOLIC PANEL - Abnormal; Notable for the following:    Glucose, Bld 146 (*)    All other components within normal limits  CBC - Abnormal; Notable for the following:    WBC 11.0 (*)    All other components within normal limits  HEPATIC FUNCTION PANEL - Abnormal;  Notable for the following:    Total Protein 6.2 (*)    Total Bilirubin 1.5 (*)    Indirect Bilirubin 1.2 (*)    All other components within normal limits  TROPONIN I  BRAIN NATRIURETIC PEPTIDE  D-DIMER, QUANTITATIVE (NOT AT [facility])  LIPASE, BLOOD  TROPONIN I    Imaging Review Dg Chest 2 View  09/27/2015   CLINICAL DATA:  Chest and back pain with shortness of breath and nausea tonight.  EXAM: CHEST  2 VIEW  COMPARISON:  None.  FINDINGS: Mild elevation of right hemidiaphragm. The heart is normal in size, mediastinal contours are normal. Minimal subsegmental atelectasis or scarring at the left lung base. No consolidation to suggest pneumonia. No pulmonary edema, pleural effusion or pneumothorax. No acute osseous abnormalities seen. Degenerative changes seen in the midthoracic spine.  IMPRESSION: Minimal subsegmental atelectasis or scarring at the left lung base. There is otherwise no acute process.   Electronically Signed   By: Rubye Oaks M.D.   On: 09/27/2015 01:22   Dg Abd 1 View  09/27/2015   CLINICAL DATA:  Bloating and  constipation for 3 days. History of diverticulitis, diabetes, LEFT kidney stones.  EXAM: ABDOMEN - 1 VIEW  COMPARISON:  None.  FINDINGS: The bowel gas pattern is normal. Mild amount of retained large bowel stool. No radio-opaque calculi or other significant radiographic abnormality are seen.  IMPRESSION: Normal bowel gas pattern. Mild amount of retained large bowel stool.   Electronically Signed   By: Awilda Metro M.D.   On: 09/27/2015 02:16   I have personally reviewed and evaluated these images and lab results as part of my medical decision-making.   EKG Interpretation   Date/Time:  Thursday September 27 2015 00:09:11 EDT Ventricular Rate:  97 PR Interval:  200 QRS Duration: 98 QT Interval:  358 QTC Calculation: 454 R Axis:   -31 Text Interpretation:  Normal sinus rhythm Left axis deviation Moderate  voltage criteria for LVH, may be normal variant Possible Inferior infarct  , age undetermined Abnormal ECG No significant change since last tracing  Confirmed by HORTON  MD, Toni Amend ([id number]) on 09/27/2015 12:12:28 AM      MDM   Final diagnoses:  Other chest pain  Abdominal bloating  Constipation, unspecified constipation type    Patient presents for chest pain, abdominal pain, and back pain. Nontoxic on exam. Initial vital signs are reassuring. His chest pain appears to be accompanied by multiple abdominal symptoms including nausea, bloating, constipation. Very atypical for ACS. However, patient does has risk factors. EKG is nonischemic. Initial troponin is negative. Chest x-ray is reassuring. Abdominal labs were obtained and unremarkable. Abdominal exam is also unremarkable without signs of peritonitis.  Patient does appear to have moderate stool burden on KUB.  Patient reports improvement after pain medicine, GI cocktail, and Protonix. Low suspicion at this time for ACS. Delta troponin also negative. However, given recent factors would have him follow-up closely with cardiology.  Giving bloating and belching, patient may have an element of reflux. He does report back pain. I reviewed prior imaging, no history of AAA and no widening of the mediastinum on chest x-ray suggestive of dissection. Patient's clinical picture does not fit.  Patient is requesting discharge home. Discussed with patient cardiology and PCP follow-up. He will be started on a PPI and MiraLAX for constipation. If he has any new or worsening symptoms he should return immediately.  After history, exam, and medical workup I feel the patient has been appropriately medically  screened and is safe for discharge home. Pertinent diagnoses were discussed with the patient. Patient was given return precautions.  I personally performed the services described in this documentation, which was scribed in my presence. The recorded information has been reviewed and is accurate.   Shon Baton, MD 09/27/15 [phone]

## 2015-09-27 NOTE — Discharge Instructions (Signed)
You were seen today for chest pain, back pain, and abdominal bloating. Your workup is reassuring. Your cardiac testing in the ED is unremarkable. You do need to follow-up with cardiology for formal cardiac evaluation. Your symptoms are most suspicious for a gastrointestinal source of your pain. He will be treated for reflux and constipation. He had any new or worsening symptoms she should be evaluated immediately.  Chest Pain (Nonspecific) It is often hard to give a specific diagnosis for the cause of chest pain. There is always a chance that your pain could be related to something serious, such as a heart attack or a blood clot in the lungs. You need to follow up with your health care provider for further evaluation. CAUSES   Heartburn.  Pneumonia or bronchitis.  Anxiety or stress.  Inflammation around your heart (pericarditis) or lung (pleuritis or pleurisy).  A blood clot in the lung.  A collapsed lung (pneumothorax). It can develop suddenly on its own (spontaneous pneumothorax) or from trauma to the chest.  Shingles infection (herpes zoster virus). The chest wall is composed of bones, muscles, and cartilage. Any of these can be the source of the pain.  The bones can be bruised by injury.  The muscles or cartilage can be strained by coughing or overwork.  The cartilage can be affected by inflammation and become sore (costochondritis). DIAGNOSIS  Lab tests or other studies may be needed to find the cause of your pain. Your health care provider may have you take a test called an ambulatory electrocardiogram (ECG). An ECG records your heartbeat patterns over a 24-hour period. You may also have other tests, such as:  Transthoracic echocardiogram (TTE). During echocardiography, sound waves are used to evaluate how blood flows through your heart.  Transesophageal echocardiogram (TEE).  Cardiac monitoring. This allows your health care provider to monitor your heart rate and rhythm in  real time.  Holter monitor. This is a portable device that records your heartbeat and can help diagnose heart arrhythmias. It allows your health care provider to track your heart activity for several days, if needed.  Stress tests by exercise or by giving medicine that makes the heart beat faster. TREATMENT   Treatment depends on what may be causing your chest pain. Treatment may include:  Acid blockers for heartburn.  Anti-inflammatory medicine.  Pain medicine for inflammatory conditions.  Antibiotics if an infection is present.  You may be advised to change lifestyle habits. This includes stopping smoking and avoiding alcohol, caffeine, and chocolate.  You may be advised to keep your head raised (elevated) when sleeping. This reduces the chance of acid going backward from your stomach into your esophagus. Most of the time, nonspecific chest pain will improve within 2-3 days with rest and mild pain medicine.  HOME CARE INSTRUCTIONS   If antibiotics were prescribed, take them as directed. Finish them even if you start to feel better.  For the next few days, avoid physical activities that bring on chest pain. Continue physical activities as directed.  Do not use any tobacco products, including cigarettes, chewing tobacco, or electronic cigarettes.  Avoid drinking alcohol.  Only take medicine as directed by your health care provider.  Follow your health care provider's suggestions for further testing if your chest pain does not go away.  Keep any follow-up appointments you made. If you do not go to an appointment, you could develop lasting (chronic) problems with pain. If there is any problem keeping an appointment, call to reschedule. SEEK MEDICAL  CARE IF:   Your chest pain does not go away, even after treatment.  You have a rash with blisters on your chest.  You have a fever. SEEK IMMEDIATE MEDICAL CARE IF:   You have increased chest pain or pain that spreads to your arm,  neck, jaw, back, or abdomen.  You have shortness of breath.  You have an increasing cough, or you cough up blood.  You have severe back or abdominal pain.  You feel nauseous or vomit.  You have severe weakness.  You faint.  You have chills. This is an emergency. Do not wait to see if the pain will go away. Get medical help at once. Call your local emergency services (911 in [place] Do not drive yourself to the hospital. MAKE SURE YOU:   Understand these instructions.  Will watch your condition.  Will get help right away if you are not doing well or get worse. Document Released: 09/24/2005 Document Revised: 12/20/2013 Document Reviewed: 07/20/2008 [facility] Patient Information 2015 [place], [place]. This information is not intended to replace advice given to you by your health care provider. Make sure you discuss any questions you have with your health care provider.

## 2015-10-05 ENCOUNTER — Encounter: Payer: Self-pay | Admitting: Family

## 2015-10-05 ENCOUNTER — Ambulatory Visit (INDEPENDENT_AMBULATORY_CARE_PROVIDER_SITE_OTHER): Payer: Medicare Other | Admitting: Family

## 2015-10-05 VITALS — BP 146/85 | HR 67 | Temp 97.1°F | Ht 70.0 in | Wt 244.2 lb

## 2015-10-05 DIAGNOSIS — F419 Anxiety disorder, unspecified: Secondary | ICD-10-CM

## 2015-10-05 DIAGNOSIS — Z125 Encounter for screening for malignant neoplasm of prostate: Secondary | ICD-10-CM | POA: Diagnosis not present

## 2015-10-05 DIAGNOSIS — K5790 Diverticulosis of intestine, part unspecified, without perforation or abscess without bleeding: Secondary | ICD-10-CM

## 2015-10-05 DIAGNOSIS — E1165 Type 2 diabetes mellitus with hyperglycemia: Secondary | ICD-10-CM

## 2015-10-05 DIAGNOSIS — K2970 Gastritis, unspecified, without bleeding: Secondary | ICD-10-CM | POA: Diagnosis not present

## 2015-10-05 DIAGNOSIS — E785 Hyperlipidemia, unspecified: Secondary | ICD-10-CM

## 2015-10-05 DIAGNOSIS — I10 Essential (primary) hypertension: Secondary | ICD-10-CM

## 2015-10-05 DIAGNOSIS — E559 Vitamin D deficiency, unspecified: Secondary | ICD-10-CM | POA: Diagnosis not present

## 2015-10-05 DIAGNOSIS — N400 Enlarged prostate without lower urinary tract symptoms: Secondary | ICD-10-CM

## 2015-10-05 LAB — POCT GLYCOSYLATED HEMOGLOBIN (HGB A1C): Hemoglobin A1C: 7

## 2015-10-05 LAB — POCT UA - MICROALBUMIN: Microalbumin Ur, POC: 50 mg/L

## 2015-10-05 NOTE — Progress Notes (Signed)
Subjective:    Patient ID: Vincent Collins, male    DOB: 02-28-1941, 74 y.o.   MRN: [id number]  Pt presents to the office today for chronic follow up. Pt went to the ED for chest pain, bloating, and constipation. Pt states he was a "diverticulitis flare up". Pt reports feeling better and states "my heart checked out good!" Diabetes He presents for his follow-up diabetic visit. He has type 2 diabetes mellitus. His disease course has been fluctuating. Hypoglycemia symptoms include nervousness/anxiousness. Pertinent negatives for hypoglycemia include no confusion, dizziness or headaches. Pertinent negatives for diabetes include no blurred vision, no chest pain, no foot paresthesias, no foot ulcerations and no visual change. Pertinent negatives for diabetic complications include no CVA, heart disease, nephropathy or peripheral neuropathy. Risk factors for coronary artery disease include diabetes mellitus, dyslipidemia, hypertension, male sex, obesity and family history. Current diabetic treatment includes oral agent (dual therapy). He is compliant with treatment all of the time. He is following a generally healthy diet. His breakfast blood glucose range is generally 140-180 mg/dl. An ACE inhibitor/angiotensin II receptor blocker is being taken. Eye exam is not current.  Hypertension This is a chronic problem. The current episode started more than 1 year ago. The problem has been waxing and waning since onset. The problem is uncontrolled (Pt states he takes his BP at home every morning and iit's avg 130's/80's). Associated symptoms include anxiety. Pertinent negatives include no blurred vision, chest pain, headaches, palpitations, peripheral edema or shortness of breath. Risk factors for coronary artery disease include diabetes mellitus, dyslipidemia, family history, male gender and obesity. Past treatments include ACE inhibitors and calcium channel blockers. The current treatment provides significant  improvement. Compliance problems include exercise.  Hypertensive end-organ damage includes kidney disease. There is no history of CAD/MI, CVA, heart failure or a thyroid problem. There is no history of chronic renal disease.  Hyperlipidemia This is a chronic problem. The current episode started more than 1 year ago. The problem is controlled. Recent lipid tests were reviewed and are normal. Exacerbating diseases include diabetes and obesity. He has no history of chronic renal disease. Factors aggravating his hyperlipidemia include fatty foods. Pertinent negatives include no chest pain, leg pain, myalgias or shortness of breath. Current antihyperlipidemic treatment includes statins. The current treatment provides moderate improvement of lipids. Risk factors for coronary artery disease include dyslipidemia, family history, male sex, obesity, diabetes mellitus and a sedentary lifestyle.  Anxiety Presents for follow-up visit. Onset was 1 to 6 months ago. The problem has been waxing and waning. Symptoms include excessive worry, nausea, nervous/anxious behavior and panic. Patient reports no chest pain, confusion, depressed mood, dizziness, insomnia, irritability, palpitations or shortness of breath. Symptoms occur occasionally. The symptoms are aggravated by family issues.   His past medical history is significant for anxiety/panic attacks. There is no history of depression. Past treatments include benzodiazephines. The treatment provided mild relief. Compliance with prior treatments has been good.  Benign Prostatic Hypertrophy This is a chronic problem. The current episode started more than 1 year ago. The problem has been waxing and waning since onset. Irritative symptoms include frequency, nocturia and urgency. Associated symptoms include nausea and vomiting. Pertinent negatives include no hematuria. The symptoms are aggravated by diuretics. Past treatments include tamsulosin.      Review of Systems    Constitutional: Negative.  Negative for irritability.  HENT: Negative.   Eyes: Negative for blurred vision.  Respiratory: Negative.  Negative for shortness of breath.   Cardiovascular: Negative.  Negative for chest pain and palpitations.  Gastrointestinal: Positive for nausea and vomiting.  Endocrine: Negative.   Genitourinary: Positive for urgency, frequency and nocturia. Negative for hematuria.  Musculoskeletal: Negative.  Negative for myalgias.  Neurological: Negative.  Negative for dizziness and headaches.  Hematological: Negative.   Psychiatric/Behavioral: Negative for confusion. The patient is nervous/anxious. The patient does not have insomnia.   All other systems reviewed and are negative.      Objective:   Physical Exam  Constitutional: He is oriented to person, place, and time. He appears well-developed and well-nourished. No distress.  HENT:  Head: Normocephalic.  Right Ear: External ear normal.  Left Ear: External ear normal.  Mouth/Throat: Oropharynx is clear and moist.  Eyes: Pupils are equal, round, and reactive to light. Right eye exhibits no discharge. Left eye exhibits no discharge.  Neck: Normal range of motion. Neck supple. No thyromegaly present.  Cardiovascular: Normal rate, regular rhythm, normal heart sounds and intact distal pulses.   No murmur heard. Pulmonary/Chest: Effort normal and breath sounds normal. No respiratory distress. He has no wheezes.  Abdominal: Soft. Bowel sounds are normal. He exhibits no distension. There is no tenderness.  Musculoskeletal: Normal range of motion. He exhibits no edema or tenderness.  Neurological: He is alert and oriented to person, place, and time. He has normal reflexes. No cranial nerve deficit.  Skin: Skin is warm and dry. No rash noted. No erythema.  Psychiatric: He has a normal mood and affect. His behavior is normal. Judgment and thought content normal.  Vitals reviewed.     BP 146/85 mmHg  Pulse 67   Temp(Src) 97.1 F (36.2 C) (Oral)  Ht $R'5\' 10"'Zl$  (1.778 m)  Wt 244 lb 3.2 oz (110.768 kg)  BMI 35.04 kg/m2     Assessment & Plan:  1. Essential hypertension - CMP14+EGFR  2. Diverticulosis of intestine without bleeding, unspecified intestinal tract location - CMP14+EGFR  3. Gastritis - CMP14+EGFR  4. Type 2 diabetes mellitus with hyperglycemia, without long-term current use of insulin (HCC) - POCT glycosylated hemoglobin (Hb A1C) - POCT UA - Microalbumin - CMP14+EGFR - Ambulatory referral to Ophthalmology  5. BPH (benign prostatic hyperplasia) - CMP14+EGFR  6. Anxiety - CMP14+EGFR  7. Hyperlipemia - CMP14+EGFR - Lipid panel  8. Vitamin D deficiency - CMP14+EGFR  9. Prostate cancer screening - CMP14+EGFR - PSA, total and free   Continue all meds Labs pending Health Maintenance reviewed- hemoccult cards given to patient with directions Diet and exercise encouraged RTO 3 months  Evelina Dun, FNP

## 2015-10-05 NOTE — Patient Instructions (Addendum)
Diverticulitis Diverticulitis is inflammation or infection of small pouches in your colon that form when you have a condition called diverticulosis. The pouches in your colon are called diverticula. Your colon, or large intestine, is where water is absorbed and stool is formed. Complications of diverticulitis can include:  Bleeding.  Severe infection.  Severe pain.  Perforation of your colon.  Obstruction of your colon. CAUSES  Diverticulitis is caused by bacteria. Diverticulitis happens when stool becomes trapped in diverticula. This allows bacteria to grow in the diverticula, which can lead to inflammation and infection. RISK FACTORS People with diverticulosis are at risk for diverticulitis. Eating a diet that does not include enough fiber from fruits and vegetables may make diverticulitis more likely to develop. SYMPTOMS  Symptoms of diverticulitis may include:  Abdominal pain and tenderness. The pain is normally located on the left side of the abdomen, but may occur in other areas.  Fever and chills.  Bloating.  Cramping.  Nausea.  Vomiting.  Constipation.  Diarrhea.  Blood in your stool. DIAGNOSIS  Your health care provider will ask you about your medical history and do a physical exam. You may need to have tests done because many medical conditions can cause the same symptoms as diverticulitis. Tests may include:  Blood tests.  Urine tests.  Imaging tests of the abdomen, including X-rays and CT scans. When your condition is under control, your health care provider may recommend that you have a colonoscopy. A colonoscopy can show how severe your diverticula are and whether something else is causing your symptoms. TREATMENT  Most cases of diverticulitis are mild and can be treated at home. Treatment may include:  Taking over-the-counter pain medicines.  Following a clear liquid diet.  Taking antibiotic medicines by mouth for 7-10 days. More severe cases may  be treated at a hospital. Treatment may include:  Not eating or drinking.  Taking prescription pain medicine.  Receiving antibiotic medicines through an IV tube.  Receiving fluids and nutrition through an IV tube.  Surgery. HOME CARE INSTRUCTIONS   Follow your health care provider's instructions carefully.  Follow a full liquid diet or other diet as directed by your health care provider. After your symptoms improve, your health care provider may tell you to change your diet. He or she may recommend you eat a high-fiber diet. Fruits and vegetables are good sources of fiber. Fiber makes it easier to pass stool.  Take fiber supplements or probiotics as directed by your health care provider.  Only take medicines as directed by your health care provider.  Keep all your follow-up appointments. SEEK MEDICAL CARE IF:   Your pain does not improve.  You have a hard time eating food.  Your bowel movements do not return to normal. SEEK IMMEDIATE MEDICAL CARE IF:   Your pain becomes worse.  Your symptoms do not get better.  Your symptoms suddenly get worse.  You have a fever.  You have repeated vomiting.  You have bloody or black, tarry stools. MAKE SURE YOU:   Understand these instructions.  Will watch your condition.  Will get help right away if you are not doing well or get worse.   This information is not intended to replace advice given to you by your health care provider. Make sure you discuss any questions you have with your health care provider.   Document Released: 09/24/2005 Document Revised: 12/20/2013 Document Reviewed: 11/09/2013 Elsevier Interactive Patient Education 2016 [employer]    Diverticulosis Diverticulosis is the condition that   develops when small pouches (diverticula) form in the wall of your colon. Your colon, or large intestine, is where water is absorbed and stool is formed. The pouches form when the inside layer of your colon pushes through  weak spots in the outer layers of your colon. CAUSES  No one knows exactly what causes diverticulosis. RISK FACTORS  Being older than 50. Your risk for this condition increases with age. Diverticulosis is rare in people younger than 40 years. By age 80, almost everyone has it.  Eating a low-fiber diet.  Being frequently constipated.  Being overweight.  Not getting enough exercise.  Smoking.  Taking over-the-counter pain medicines, like aspirin and ibuprofen. SYMPTOMS  Most people with diverticulosis do not have symptoms. DIAGNOSIS  Because diverticulosis often has no symptoms, health care providers often discover the condition during an exam for other colon problems. In many cases, a health care provider will diagnose diverticulosis while using a flexible scope to examine the colon (colonoscopy). TREATMENT  If you have never developed an infection related to diverticulosis, you may not need treatment. If you have had an infection before, treatment may include:  Eating more fruits, vegetables, and grains.  Taking a fiber supplement.  Taking a live bacteria supplement (probiotic).  Taking medicine to relax your colon. HOME CARE INSTRUCTIONS   Drink at least 6-8 glasses of water each day to prevent constipation.  Try not to strain when you have a bowel movement.  Keep all follow-up appointments. If you have had an infection before:  Increase the fiber in your diet as directed by your health care provider or dietitian.  Take a dietary fiber supplement if your health care provider approves.  Only take medicines as directed by your health care provider. SEEK MEDICAL CARE IF:   You have abdominal pain.  You have bloating.  You have cramps.  You have not gone to the bathroom in 3 days. SEEK IMMEDIATE MEDICAL CARE IF:   Your pain gets worse.  Yourbloating becomes very bad.  You have a fever or chills, and your symptoms suddenly get worse.  You begin  vomiting.  You have bowel movements that are bloody or black. MAKE SURE YOU:  Understand these instructions.  Will watch your condition.  Will get help right away if you are not doing well or get worse.   This information is not intended to replace advice given to you by your health care provider. Make sure you discuss any questions you have with your health care provider.   Document Released: 09/11/2004 Document Revised: 12/20/2013 Document Reviewed: 11/09/2013 Elsevier Interactive Patient Education 2016 [employer] Inc.  

## 2015-10-05 NOTE — Addendum Note (Signed)
Addended by: Tommas Olp on: 10/05/2015 11:35 AM   Modules accepted: Orders

## 2015-10-06 LAB — CMP14+EGFR
A/G RATIO: 2.1 (ref 1.1–2.5)
ALK PHOS: 147 IU/L — AB (ref 39–117)
ALT: 106 IU/L — AB (ref 0–44)
AST: 36 IU/L (ref 0–40)
Albumin: 4.2 g/dL (ref 3.5–4.8)
BILIRUBIN TOTAL: 1.3 mg/dL — AB (ref 0.0–1.2)
BUN / CREAT RATIO: 16 (ref 10–22)
BUN: 13 mg/dL (ref 8–27)
CO2: 24 mmol/L (ref 18–29)
Calcium: 9.1 mg/dL (ref 8.6–10.2)
Chloride: 101 mmol/L (ref 97–108)
Creatinine, Ser: 0.81 mg/dL (ref 0.76–1.27)
GFR calc Af Amer: 102 mL/min/{1.73_m2} (ref >59)
GFR calc non Af Amer: 88 mL/min/{1.73_m2} (ref >59)
GLUCOSE: 142 mg/dL — AB (ref 65–99)
Globulin, Total: 2 g/dL (ref 1.5–4.5)
POTASSIUM: 4.6 mmol/L (ref 3.5–5.2)
Sodium: 142 mmol/L (ref 134–144)
Total Protein: 6.2 g/dL (ref 6.0–8.5)

## 2015-10-06 LAB — LIPID PANEL
CHOLESTEROL TOTAL: 124 mg/dL (ref 100–199)
Chol/HDL Ratio: 3.8 ratio units (ref 0.0–5.0)
HDL: 33 mg/dL — AB (ref >39)
LDL Calculated: 69 mg/dL (ref 0–99)
Triglycerides: 109 mg/dL (ref 0–149)
VLDL CHOLESTEROL CAL: 22 mg/dL (ref 5–40)

## 2015-10-06 LAB — PSA, TOTAL AND FREE
PSA FREE: 0.37 ng/mL
PSA, Free Pct: 23.1 %
Prostate Specific Ag, Serum: 1.6 ng/mL (ref 0.0–4.0)

## 2015-10-06 LAB — MICROALBUMIN, URINE: MICROALBUM., U, RANDOM: 27.2 ug/mL

## 2015-10-19 ENCOUNTER — Ambulatory Visit (INDEPENDENT_AMBULATORY_CARE_PROVIDER_SITE_OTHER): Payer: Medicare Other

## 2015-10-19 DIAGNOSIS — Z23 Encounter for immunization: Secondary | ICD-10-CM

## 2015-10-24 ENCOUNTER — Other Ambulatory Visit: Payer: Self-pay | Admitting: Family

## 2015-10-30 ENCOUNTER — Telehealth: Payer: Self-pay | Admitting: Family

## 2015-11-28 ENCOUNTER — Other Ambulatory Visit: Payer: Self-pay | Admitting: Family

## 2015-12-03 ENCOUNTER — Encounter: Payer: Self-pay | Admitting: Family Medicine

## 2015-12-03 ENCOUNTER — Ambulatory Visit (INDEPENDENT_AMBULATORY_CARE_PROVIDER_SITE_OTHER): Payer: Medicare Other | Admitting: Family Medicine

## 2015-12-03 VITALS — BP 159/88 | HR 77 | Temp 97.3°F | Ht 70.0 in | Wt 250.2 lb

## 2015-12-03 DIAGNOSIS — J441 Chronic obstructive pulmonary disease with (acute) exacerbation: Secondary | ICD-10-CM | POA: Diagnosis not present

## 2015-12-03 MED ORDER — AZITHROMYCIN 250 MG PO TABS: ORAL_TABLET | ORAL | Status: DC

## 2015-12-03 MED ORDER — PREDNISONE 20 MG PO TABS: ORAL_TABLET | ORAL | Status: DC

## 2015-12-03 MED ORDER — HYDROCODONE-HOMATROPINE 5-1.5 MG/5ML PO SYRP: 5.0000 mL | ORAL_SOLUTION | Freq: Four times a day (QID) | ORAL | Status: DC | PRN

## 2015-12-03 NOTE — Progress Notes (Signed)
BP 159/88 mmHg  Pulse 77  Temp(Src) 97.3 F (36.3 C) (Oral)  Ht 5\' 10"  (1.778 m)  Wt 250 lb 3.2 oz (113.49 kg)  BMI 35.90 kg/m2   Subjective:    Patient ID: Vincent Collins, male    DOB: [birth year], 74 y.o.   MRN: [id number]  HPI: Vincent Collins is a 74 y.o. male presenting on 12/03/2015 for Nasal Congestion; Cough; Sore Throat; and Chills   HPI Cough and wheezing Patient has been having coughing and wheezing and sore throat and nasal congestion that have been increasing over the past week. He denies any fevers but does have some chills. He denies any sick contacts. He has tried to use over-the-counter Tylenol Sinus and cold and he is Proventil and is still using his Symbicort. He says he gets like this about once a year. Last night he had a significant trouble with coughing spells. He says his cough is mostly nonproductive and it just gets stuck in his throat. He is not smoking anymore but did smoke previously.  Relevant past medical, surgical, family and social history reviewed and updated as indicated. Interim medical history since our last visit reviewed. Allergies and medications reviewed and updated.  Review of Systems  Constitutional: Positive for chills. Negative for fever.  HENT: Positive for congestion, postnasal drip, rhinorrhea, sinus pressure and sore throat. Negative for ear discharge, ear pain, sneezing and voice change.   Eyes: Negative for pain, discharge, redness and visual disturbance.  Respiratory: Positive for cough and wheezing. Negative for shortness of breath.   Cardiovascular: Negative for chest pain, palpitations and leg swelling.  Gastrointestinal: Negative for abdominal pain, diarrhea and constipation.  Genitourinary: Negative for difficulty urinating.  Musculoskeletal: Negative for back pain and gait problem.  Skin: Negative for rash.  Neurological: Negative for syncope, light-headedness and headaches.  All other systems reviewed and are negative.   Per  HPI unless specifically indicated above     Medication List       This list is accurate as of: 12/03/15  2:56 PM.  Always use your most recent med list.               ACCU-CHEK AVIVA PLUS test strip  Generic drug:  glucose blood     ACCU-CHEK SOFTCLIX LANCETS lancets     ALPRAZolam 0.5 MG tablet  Commonly known as:  XANAX  Take 1 tablet (0.5 mg total) by mouth 2 (two) times daily.     aspirin 81 MG EC tablet  Take 81 mg by mouth daily.     atorvastatin 40 MG tablet  Commonly known as:  LIPITOR  TAKE 1 TABLET DAILY     azithromycin 250 MG tablet  Commonly known as:  ZITHROMAX  Take 2 the first day and then one each day after.     famotidine 20 MG tablet  Commonly known as:  PEPCID  TAKE  (1)  TABLET TWICE A DAY.     fish oil-omega-3 fatty acids 1000 MG capsule  Take 1-2 g by mouth 2 (two) times daily. Patient takes 2g in the morning and 1g at night     meclizine 25 MG tablet  Commonly known as:  ANTIVERT  TAKE (1) TABLET THREE TIMES DAILY AS NEEDED FOR DIZZINESS.     metFORMIN 500 MG 24 hr tablet  Commonly known as:  GLUCOPHAGE-XR  TAKE 1 TABLET ONCE DAILY WITH LARGEST MEAL.     metoprolol succinate 50 MG 24 hr tablet  Commonly  known as:  TOPROL-XL  Take 50 mg by mouth daily. Take with or immediately following a meal.     metoprolol succinate 50 MG 24 hr tablet  Commonly known as:  TOPROL-XL  TAKE 1 TABLET ONCE A DAY WITH A MEAL     predniSONE 20 MG tablet  Commonly known as:  DELTASONE  2 po at same time daily for 5 days     PROAIR HFA 108 (90 BASE) MCG/ACT inhaler  Generic drug:  albuterol  2 PUFFS EVERY 6 HOURS AS NEEDED FOR WHEEZING     albuterol (2.5 MG/3ML) 0.083% nebulizer solution  Commonly known as:  PROVENTIL  NEBULIZE 1 VIAL FOUR TIMES DAILY AS NEEDED FOR SHORTNESS OF BREATH ORW HEEZING     SYMBICORT 80-4.5 MCG/ACT inhaler  Generic drug:  budesonide-formoterol  Inhale 2 puffs into the lungs 2 (two) times daily.     tamsulosin 0.4 MG  Caps capsule  Commonly known as:  FLOMAX  Take 1 capsule (0.4 mg total) by mouth daily.     Vitamin D 1000 UNITS capsule  Take 1,000 Units by mouth 2 (two) times daily.           Objective:    BP 159/88 mmHg  Pulse 77  Temp(Src) 97.3 F (36.3 C) (Oral)  Ht  (1.778 m)  Wt 250 lb 3.2 oz (113.49 kg)  BMI 35.90 kg/m2  Wt Readings from Last 3 Encounters:  12/03/15 250 lb 3.2 oz (113.49 kg)  10/05/15 244 lb 3.2 oz (110.768 kg)  08/07/15 251 lb (113.853 kg)    Physical Exam  Constitutional: He is oriented to person, place, and time. He appears well-developed and well-nourished. No distress.  HENT:  Right Ear: Tympanic membrane, external ear and ear canal normal.  Left Ear: Tympanic membrane, external ear and ear canal normal.  Nose: Mucosal edema and rhinorrhea present. No sinus tenderness. No epistaxis. Right sinus exhibits no maxillary sinus tenderness and no frontal sinus tenderness. Left sinus exhibits no maxillary sinus tenderness and no frontal sinus tenderness.  Mouth/Throat: Uvula is midline and mucous membranes are normal. Posterior oropharyngeal edema and posterior oropharyngeal erythema present. No oropharyngeal exudate or tonsillar abscesses.  Eyes: Conjunctivae and EOM are normal. Pupils are equal, round, and reactive to light. Right eye exhibits no discharge. No scleral icterus.  Cardiovascular: Normal rate, regular rhythm, normal heart sounds and intact distal pulses.   No murmur heard. Pulmonary/Chest: Effort normal and breath sounds normal. No respiratory distress. He has no wheezes. He has no rales. He exhibits no tenderness.  Abdominal: He exhibits no distension.  Musculoskeletal: Normal range of motion. He exhibits no edema.  Neurological: He is alert and oriented to person, place, and time. Coordination normal.  Skin: Skin is warm and dry. No rash noted. He is not diaphoretic.  Psychiatric: He has a normal mood and affect. His behavior is normal.  Vitals  reviewed.     Assessment & Plan:   Problem List Items Addressed This Visit    None    Visit Diagnoses    COPD exacerbation (HCC)    -  Primary    Relevant Medications    predniSONE (DELTASONE) 20 MG tablet    azithromycin (ZITHROMAX) 250 MG tablet    HYDROcodone-homatropine (HYCODAN) 5-1.5 MG/5ML syrup        Follow up plan: Return in about 5 weeks (around 01/07/2016), or if symptoms worsen or fail to improve, for Diabetes and hypertension check.  Counseling provided for all of  the vaccine components No orders of the defined types were placed in this encounter.    Arville Care, MD [facility] 12/03/2015, 2:56 PM

## 2015-12-11 ENCOUNTER — Telehealth: Payer: Self-pay | Admitting: Family Medicine

## 2015-12-11 MED ORDER — PREDNISONE 20 MG PO TABS: ORAL_TABLET | ORAL | Status: DC

## 2015-12-11 NOTE — Telephone Encounter (Signed)
Dr. Louanne Skyeettinger, please let me know specific directions for the prednisone so I can make sure I do it exactly as you recommend.  Thanks!!

## 2015-12-11 NOTE — Telephone Encounter (Signed)
Sent the prednisone myself.

## 2015-12-11 NOTE — Telephone Encounter (Signed)
Left detailed message that [facility]

## 2015-12-11 NOTE — Telephone Encounter (Signed)
Please call in a longer prednisone taper, 10 day taper would probably be best. Start at 60 mg and taper down to 20 mg

## 2016-01-02 ENCOUNTER — Other Ambulatory Visit: Payer: Self-pay | Admitting: Family Medicine

## 2016-01-02 ENCOUNTER — Other Ambulatory Visit: Payer: Self-pay | Admitting: Family

## 2016-01-16 ENCOUNTER — Ambulatory Visit: Payer: Self-pay | Admitting: Family

## 2016-01-23 ENCOUNTER — Encounter: Payer: Self-pay | Admitting: Family Medicine

## 2016-01-23 ENCOUNTER — Ambulatory Visit (INDEPENDENT_AMBULATORY_CARE_PROVIDER_SITE_OTHER): Payer: Medicare Other | Admitting: Family Medicine

## 2016-01-23 VITALS — BP 132/86 | HR 60 | Temp 97.6°F | Ht 70.0 in | Wt 251.8 lb

## 2016-01-23 DIAGNOSIS — I10 Essential (primary) hypertension: Secondary | ICD-10-CM

## 2016-01-23 DIAGNOSIS — E785 Hyperlipidemia, unspecified: Secondary | ICD-10-CM

## 2016-01-23 DIAGNOSIS — E119 Type 2 diabetes mellitus without complications: Secondary | ICD-10-CM

## 2016-01-23 DIAGNOSIS — J019 Acute sinusitis, unspecified: Secondary | ICD-10-CM

## 2016-01-23 LAB — POCT GLYCOSYLATED HEMOGLOBIN (HGB A1C): HEMOGLOBIN A1C: 7.9

## 2016-01-23 MED ORDER — ALBUTEROL SULFATE (2.5 MG/3ML) 0.083% IN NEBU: INHALATION_SOLUTION | RESPIRATORY_TRACT | Status: DC

## 2016-01-23 MED ORDER — METOPROLOL SUCCINATE ER 50 MG PO TB24: 50.0000 mg | ORAL_TABLET | Freq: Every day | ORAL | Status: DC

## 2016-01-23 MED ORDER — FLUTICASONE PROPIONATE 50 MCG/ACT NA SUSP: 1.0000 | Freq: Two times a day (BID) | NASAL | Status: DC | PRN

## 2016-01-23 MED ORDER — ALBUTEROL SULFATE HFA 108 (90 BASE) MCG/ACT IN AERS: INHALATION_SPRAY | RESPIRATORY_TRACT | Status: DC

## 2016-01-23 MED ORDER — ATORVASTATIN CALCIUM 40 MG PO TABS: 40.0000 mg | ORAL_TABLET | Freq: Every day | ORAL | Status: DC

## 2016-01-23 MED ORDER — FAMOTIDINE 20 MG PO TABS: ORAL_TABLET | ORAL | Status: DC

## 2016-01-23 MED ORDER — BUDESONIDE-FORMOTEROL FUMARATE 80-4.5 MCG/ACT IN AERO: INHALATION_SPRAY | RESPIRATORY_TRACT | Status: DC

## 2016-01-23 NOTE — Progress Notes (Signed)
BP 132/86 mmHg  Pulse 60  Temp(Src) 97.6 F (36.4 C) (Oral)  Ht '5\' 10"'$  (1.778 m)  Wt 251 lb 12.8 oz (114.216 kg)  BMI 36.13 kg/m2   Subjective:    Patient ID: Vincent Collins, male    DOB: 1941/09/25, 75 y.o.   MRN: [id number]  HPI: Vincent Collins is a 75 y.o. male presenting on 01/23/2016 for Hypertension; Sinusitis; and Cough   HPI Type 2 diabetes Patient presents today for a diabetes recheck. His hemoglobin A1c came back as 7.9 and previously it was 7.0. Currently he is on metformin 500 mg once a day.he denies any issues with his feet. He has seen an ophthalmologist not too long ago for his eyes.he has not had any renal complications.  Hypertension Patient comes in today for hypertension recheck and is currently on metoprolol 50 daily and his blood pressure today is 132/86. Patient denies headaches, blurred vision, chest pains, shortness of breath, or weakness. Denies any side effects from medication and is content with current medication.   Hyperlipidemia Patient is currently on atorvastatin 40 mg at bedtime. He denies any issues with that medication, Such as myalgias or liver issues that he knows of. He is due for recheck of his cholesterol today.  Sinus congestion and cough Patient has been having some sinus congestion and postnasal drainage and cough is worse at night than during the day. He has not tried anything for it yet is been going on for a couple of days now. He denies any fevers or chills or shortness of breath or wheezing.   Relevant past medical, surgical, family and social history reviewed and updated as indicated. Interim medical history since our last visit reviewed. Allergies and medications reviewed and updated.  Review of Systems  Constitutional: Negative for fever and chills.  HENT: Positive for postnasal drip, rhinorrhea, sinus pressure and sore throat. Negative for congestion, ear discharge, ear pain, sneezing and voice change.   Eyes: Negative for pain,  discharge, redness and visual disturbance.  Respiratory: Positive for cough. Negative for chest tightness, shortness of breath and wheezing.   Cardiovascular: Negative for chest pain and leg swelling.  Gastrointestinal: Negative for abdominal pain, diarrhea and constipation.  Genitourinary: Negative for difficulty urinating.  Musculoskeletal: Negative for back pain and gait problem.  Skin: Negative for rash.  Neurological: Negative for syncope, light-headedness and headaches.  All other systems reviewed and are negative.   Per HPI unless specifically indicated above     Medication List       This list is accurate as of: 01/23/16  8:41 AM.  Always use your most recent med list.               ACCU-CHEK AVIVA PLUS test strip  Generic drug:  glucose blood     ACCU-CHEK AVIVA PLUS test strip  Generic drug:  glucose blood  CHECK BLOOD SUGAR ONCE A DAY OR AS DIRECTED     ACCU-CHEK SOFTCLIX LANCETS lancets     ACCU-CHEK SOFTCLIX LANCETS lancets  Use as instructed     albuterol (2.5 MG/3ML) 0.083% nebulizer solution  Commonly known as:  PROVENTIL  NEBULIZE 1 VIAL FOUR TIMES DAILY AS NEEDED FOR SHORTNESS OF BREATH ORW HEEZING     albuterol 108 (90 Base) MCG/ACT inhaler  Commonly known as:  PROAIR HFA  2 PUFFS EVERY 6 HOURS AS NEEDED FOR WHEEZING     ALPRAZolam 0.5 MG tablet  Commonly known as:  XANAX  Take 1 tablet (  0.5 mg total) by mouth 2 (two) times daily.     aspirin 81 MG EC tablet  Take 81 mg by mouth daily.     atorvastatin 40 MG tablet  Commonly known as:  LIPITOR  Take 1 tablet (40 mg total) by mouth daily.     budesonide-formoterol 80-4.5 MCG/ACT inhaler  Commonly known as:  SYMBICORT  Inhale 2 puffs into the lungs 2 (two) times daily.     famotidine 20 MG tablet  Commonly known as:  PEPCID  TAKE  (1)  TABLET TWICE A DAY.     fish oil-omega-3 fatty acids 1000 MG capsule  Take 1-2 g by mouth 2 (two) times daily. Patient takes 2g in the morning and 1g at  night     fluticasone 50 MCG/ACT nasal spray  Commonly known as:  FLONASE  Place 1 spray into both nostrils 2 (two) times daily as needed for allergies or rhinitis.     meclizine 25 MG tablet  Commonly known as:  ANTIVERT  TAKE (1) TABLET THREE TIMES DAILY AS NEEDED FOR DIZZINESS.     metFORMIN 500 MG 24 hr tablet  Commonly known as:  GLUCOPHAGE-XR  TAKE 1 TABLET ONCE DAILY WITH LARGEST MEAL.     metoprolol succinate 50 MG 24 hr tablet  Commonly known as:  TOPROL-XL  Take 1 tablet (50 mg total) by mouth daily. Take with or immediately following a meal.     tamsulosin 0.4 MG Caps capsule  Commonly known as:  FLOMAX  Take 1 capsule (0.4 mg total) by mouth daily.     Vitamin D 1000 units capsule  Take 1,000 Units by mouth 2 (two) times daily.           Objective:    BP 132/86 mmHg  Pulse 60  Temp(Src) 97.6 F (36.4 C) (Oral)  Ht '5\' 10"'$  (1.778 m)  Wt 251 lb 12.8 oz (114.216 kg)  BMI 36.13 kg/m2  Wt Readings from Last 3 Encounters:  01/23/16 251 lb 12.8 oz (114.216 kg)  12/03/15 250 lb 3.2 oz (113.49 kg)  10/05/15 244 lb 3.2 oz (110.768 kg)    Physical Exam  Constitutional: He is oriented to person, place, and time. He appears well-developed and well-nourished. No distress.  HENT:  Right Ear: Tympanic membrane, external ear and ear canal normal.  Left Ear: Tympanic membrane, external ear and ear canal normal.  Nose: Mucosal edema and rhinorrhea present. No sinus tenderness. No epistaxis. Right sinus exhibits maxillary sinus tenderness. Right sinus exhibits no frontal sinus tenderness. Left sinus exhibits maxillary sinus tenderness. Left sinus exhibits no frontal sinus tenderness.  Mouth/Throat: Uvula is midline and mucous membranes are normal. Posterior oropharyngeal edema and posterior oropharyngeal erythema present. No oropharyngeal exudate or tonsillar abscesses.  Eyes: Conjunctivae and EOM are normal. Pupils are equal, round, and reactive to light. Right eye exhibits  no discharge. No scleral icterus.  Cardiovascular: Normal rate, regular rhythm, normal heart sounds and intact distal pulses.   No murmur heard. Pulmonary/Chest: Effort normal and breath sounds normal. No respiratory distress. He has no wheezes.  Abdominal: He exhibits no distension.  Musculoskeletal: Normal range of motion. He exhibits no edema.  Neurological: He is alert and oriented to person, place, and time. Coordination normal.  Skin: Skin is warm and dry. No rash noted. He is not diaphoretic.  Psychiatric: He has a normal mood and affect. His behavior is normal.  Vitals reviewed.   Results for orders placed or performed in visit on  10/05/15  CMP14+EGFR  Result Value Ref Range   Glucose 142 (H) 65 - 99 mg/dL   BUN 13 8 - 27 mg/dL   Creatinine, Ser 0.81 0.76 - 1.27 mg/dL   GFR calc non Af Amer 88 >59 mL/min/1.73   GFR calc Af Amer 102 >59 mL/min/1.73   BUN/Creatinine Ratio 16 10 - 22   Sodium 142 134 - 144 mmol/L   Potassium 4.6 3.5 - 5.2 mmol/L   Chloride 101 97 - 108 mmol/L   CO2 24 18 - 29 mmol/L   Calcium 9.1 8.6 - 10.2 mg/dL   Total Protein 6.2 6.0 - 8.5 g/dL   Albumin 4.2 3.5 - 4.8 g/dL   Globulin, Total 2.0 1.5 - 4.5 g/dL   Albumin/Globulin Ratio 2.1 1.1 - 2.5   Bilirubin Total 1.3 (H) 0.0 - 1.2 mg/dL   Alkaline Phosphatase 147 (H) 39 - 117 IU/L   AST 36 0 - 40 IU/L   ALT 106 (H) 0 - 44 IU/L  Lipid panel  Result Value Ref Range   Cholesterol, Total 124 100 - 199 mg/dL   Triglycerides 109 0 - 149 mg/dL   HDL 33 (L) >39 mg/dL   VLDL Cholesterol Cal 22 5 - 40 mg/dL   LDL Calculated 69 0 - 99 mg/dL   Chol/HDL Ratio 3.8 0.0 - 5.0 ratio units  PSA, total and free  Result Value Ref Range   Prostate Specific Ag, Serum 1.6 0.0 - 4.0 ng/mL   PSA, Free 0.37 N/A ng/mL   PSA, Free Pct 23.1 %  Microalbumin, urine  Result Value Ref Range   Microalbum.,U,Random 27.2 Not Estab. ug/mL  POCT glycosylated hemoglobin (Hb A1C)  Result Value Ref Range   Hemoglobin A1C 7.0     POCT UA - Microalbumin  Result Value Ref Range   Microalbumin Ur, POC 50 mg/L      Assessment & Plan:   Problem List Items Addressed This Visit      Cardiovascular and Mediastinum   HTN (hypertension)   Relevant Medications   metoprolol succinate (TOPROL-XL) 50 MG 24 hr tablet   atorvastatin (LIPITOR) 40 MG tablet     Endocrine   Type 2 diabetes mellitus (HCC) - Primary   Relevant Medications   atorvastatin (LIPITOR) 40 MG tablet   Other Relevant Orders   Lipid panel (Completed)   CMP14+EGFR (Completed)   POCT glycosylated hemoglobin (Hb A1C) (Completed)    Other Visit Diagnoses    Hyperlipidemia        Relevant Medications    metoprolol succinate (TOPROL-XL) 50 MG 24 hr tablet    atorvastatin (LIPITOR) 40 MG tablet    Other Relevant Orders    Lipid panel (Completed)    CMP14+EGFR (Completed)    POCT glycosylated hemoglobin (Hb A1C) (Completed)    Acute rhinosinusitis        Relevant Medications    fluticasone (FLONASE) 50 MCG/ACT nasal spray        Follow up plan: Return in about 3 months (around 04/22/2016), or if symptoms worsen or fail to improve, for Diabetes and hypertension.  Counseling provided for all of the vaccine components Orders Placed This Encounter  Procedures  . Lipid panel  . CMP14+EGFR  . POCT glycosylated hemoglobin (Hb A1C)    Caryl Pina, MD [facility] 01/23/2016, 8:41 AM

## 2016-01-24 LAB — LIPID PANEL
CHOLESTEROL TOTAL: 119 mg/dL (ref 100–199)
Chol/HDL Ratio: 2.8 ratio units (ref 0.0–5.0)
HDL: 42 mg/dL (ref >39)
LDL Calculated: 57 mg/dL (ref 0–99)
TRIGLYCERIDES: 99 mg/dL (ref 0–149)
VLDL CHOLESTEROL CAL: 20 mg/dL (ref 5–40)

## 2016-01-24 LAB — CMP14+EGFR
A/G RATIO: 1.7 (ref 1.1–2.5)
ALBUMIN: 4 g/dL (ref 3.5–4.8)
ALK PHOS: 107 IU/L (ref 39–117)
ALT: 22 IU/L (ref 0–44)
AST: 19 IU/L (ref 0–40)
BILIRUBIN TOTAL: 1 mg/dL (ref 0.0–1.2)
BUN / CREAT RATIO: 14 (ref 10–22)
BUN: 14 mg/dL (ref 8–27)
CHLORIDE: 101 mmol/L (ref 96–106)
CO2: 24 mmol/L (ref 18–29)
Calcium: 9.1 mg/dL (ref 8.6–10.2)
Creatinine, Ser: 1.03 mg/dL (ref 0.76–1.27)
GFR calc non Af Amer: 72 mL/min/{1.73_m2} (ref >59)
GFR, EST AFRICAN AMERICAN: 83 mL/min/{1.73_m2} (ref >59)
GLOBULIN, TOTAL: 2.3 g/dL (ref 1.5–4.5)
Glucose: 158 mg/dL — ABNORMAL HIGH (ref 65–99)
Potassium: 5.1 mmol/L (ref 3.5–5.2)
SODIUM: 145 mmol/L — AB (ref 134–144)
TOTAL PROTEIN: 6.3 g/dL (ref 6.0–8.5)

## 2016-02-20 DIAGNOSIS — J449 Chronic obstructive pulmonary disease, unspecified: Secondary | ICD-10-CM | POA: Diagnosis not present

## 2016-02-27 NOTE — Therapy (Signed)
[facility] [place], [place], [address] Phone: [phone]   Fax:  [phone]  Physical Therapy Treatment  Patient Details  Name: Vincent Collins MRN: [id number] Date of Birth: 1941-09-13 No Data Recorded  Encounter Date: 09/12/2015    Past Medical History  Diagnosis Date  . Hypertension   . Hyperlipidemia   . COPD (chronic obstructive pulmonary disease) ([place])   . Diverticulitis   . Diabetes mellitus without complication ([place])   . Hydronephrosis of left kidney   . Left ureteral stone     obstructing  . Renal calculus or stone 2015    Past Surgical History  Procedure Laterality Date  . Spine surgery    . Lumbar disc surgery    . 2d echo  04/04/09  . Cystoscopy with retrograde pyelogram, ureteroscopy and stent placement Left 10/11/2014    Procedure: CYSTOSCOPY WITH RETROGRADE PYELOGRAM, DIAGNOSTIC URETEROSCOPY AND STENT PLACEMENT;  Surgeon: Alexis Frock, MD;  Location: [facility];  Service: Urology;  Laterality: Left;    There were no vitals filed for this visit.  Visit Diagnosis:  Right low back pain, with sciatica presence unspecified                                 PT Short Term Goals - 08/29/15 3748    PT SHORT TERM GOAL #1   Title Ind with HEP.   Time 2   Period Weeks   Status Achieved           PT Long Term Goals - 09/12/15 2707    PT LONG TERM GOAL #1   Title Eliminate right LE symptoms.   Time 6   Period Weeks   Status Achieved   PT LONG TERM GOAL #2   Title Stand 15 minutes with pain not > 3/10.   Time 6   Status Achieved   PT LONG TERM GOAL #3   Title Perform ADL's with pain not > 3/10.   Time 6   Period Weeks   Status Achieved               Problem List Patient Active Problem List   Diagnosis Date Noted  . Acute kidney injury ([place]) 09/21/2014  . Hydronephrosis of left kidney 09/21/2014  . Left ureteral stone 09/21/2014  .  Angioedema 09/20/2014  . HTN (hypertension) 04/02/2011  . Anxiety 04/02/2011  . Diverticular disease 04/02/2011  . Gastritis 04/02/2011  . COPD (chronic obstructive pulmonary disease) ([place]) 04/02/2011  . BPH (benign prostatic hyperplasia) 04/02/2011  . Hyperlipemia 04/02/2011  . Vitamin D deficiency 04/02/2011  . Type 2 diabetes mellitus ([place]) 04/02/2011   PHYSICAL THERAPY DISCHARGE SUMMARY  Visits from Start of Care: 4  Current functional level related to goals / functional outcomes: Please see above.   Remaining deficits: All goals met.   Education / Equipment: HEP.  Plan: Patient agrees to discharge.  Patient goals were met. Patient is being discharged due to meeting the stated rehab goals.  ?????      Lurie Mullane, [place] MPT 02/27/2016, 5:58 PM  [facility] [address] [place], [place], [address] Phone: [phone]   Fax:  [phone]  Name: Vincent Collins MRN: [id number] Date of Birth: 1941/12/29

## 2016-03-07 ENCOUNTER — Other Ambulatory Visit: Payer: Self-pay | Admitting: Family

## 2016-03-19 DIAGNOSIS — J449 Chronic obstructive pulmonary disease, unspecified: Secondary | ICD-10-CM | POA: Diagnosis not present

## 2016-04-15 ENCOUNTER — Other Ambulatory Visit: Payer: Self-pay | Admitting: Family

## 2016-04-19 DIAGNOSIS — J449 Chronic obstructive pulmonary disease, unspecified: Secondary | ICD-10-CM | POA: Diagnosis not present

## 2016-04-23 ENCOUNTER — Encounter: Payer: Self-pay | Admitting: Family Medicine

## 2016-04-23 ENCOUNTER — Ambulatory Visit (INDEPENDENT_AMBULATORY_CARE_PROVIDER_SITE_OTHER): Payer: Medicare Other | Admitting: Family Medicine

## 2016-04-23 VITALS — BP 139/90 | HR 74 | Temp 97.0°F | Ht 70.0 in | Wt 255.6 lb

## 2016-04-23 DIAGNOSIS — I10 Essential (primary) hypertension: Secondary | ICD-10-CM | POA: Diagnosis not present

## 2016-04-23 DIAGNOSIS — E785 Hyperlipidemia, unspecified: Secondary | ICD-10-CM

## 2016-04-23 DIAGNOSIS — F419 Anxiety disorder, unspecified: Secondary | ICD-10-CM | POA: Diagnosis not present

## 2016-04-23 DIAGNOSIS — E1165 Type 2 diabetes mellitus with hyperglycemia: Secondary | ICD-10-CM

## 2016-04-23 LAB — BAYER DCA HB A1C WAIVED: HB A1C (BAYER DCA - WAIVED): 7.4 % — ABNORMAL HIGH (ref <7.0)

## 2016-04-23 MED ORDER — ALPRAZOLAM 0.5 MG PO TABS: 0.5000 mg | ORAL_TABLET | Freq: Two times a day (BID) | ORAL | Status: DC | PRN

## 2016-04-23 MED ORDER — METFORMIN HCL ER 500 MG PO TB24: 500.0000 mg | ORAL_TABLET | Freq: Two times a day (BID) | ORAL | Status: DC

## 2016-04-23 NOTE — Progress Notes (Signed)
BP 139/90 mmHg  Pulse 74  Temp(Src) 97 F (36.1 C) (Oral)  Ht  (1.778 m)  Wt 255 lb 9.6 oz (115.939 kg)  BMI 36.67 kg/m2   Subjective:    Patient ID: Vincent Collins, male    DOB: 1941/06/30, 75 y.o.   MRN: [id number]  HPI: [name] is a 75 y.o. male presenting on 04/23/2016 for Diabetes; Hyperlipidemia; Hypertension; and Medication Refill   HPI Hypertension recheck Patient is coming in for hypertension checkup today. His blood pressure today is 139/90. He is currently taking metoprolol. Patient denies headaches, blurred vision, chest pains, shortness of breath, or weakness. Denies any side effects from medication and is content with current medication.   Type 2 diabetes recheck Patient is coming in today as well for a recheck on his diabetes. He is currently taking metformin 500 mg twice a day. He denies any issues with medication. His hemoglobin A1c is 7.4 which is down from 7.9. He does not want to add any medications at this time but wants to continue trying diet and exercise see if we can bring it down even more. He denies any issues with his feet. He is due to go see his ophthalmologist again but sees him every year.  Hyperlipidemia Patient is coming in for recheck on his cholesterol as well. His current medication is Lipitor 40 mg and he denies any issues with that medication such as myalgias or liver issues that he knows of.  Anxiety Patient admits to having anxiety and says that he takes Xanax 0.5 mg but doesn't use it every day and in fact only uses it about once or twice a week. He denies any suicidal ideations. He denies any issues with sleeping. He denies any issues with depression or sadness.  Relevant past medical, surgical, family and social history reviewed and updated as indicated. Interim medical history since our last visit reviewed. Allergies and medications reviewed and updated.  Review of Systems  Constitutional: Negative for fever and chills.  HENT:  Negative for ear discharge and ear pain.   Eyes: Negative for discharge and visual disturbance.  Respiratory: Negative for shortness of breath and wheezing.   Cardiovascular: Negative for chest pain and leg swelling.  Gastrointestinal: Negative for abdominal pain, diarrhea and constipation.  Genitourinary: Negative for difficulty urinating.  Musculoskeletal: Negative for back pain and gait problem.  Skin: Negative for rash.  Neurological: Negative for dizziness, syncope, light-headedness and headaches.  Psychiatric/Behavioral: Negative for suicidal ideas, sleep disturbance, self-injury and dysphoric mood. The patient is nervous/anxious.   All other systems reviewed and are negative.   Per HPI unless specifically indicated above     Medication List       This list is accurate as of: 04/23/16  8:51 AM.  Always use your most recent med list.               ACCU-CHEK AVIVA PLUS test strip  Generic drug:  glucose blood     ACCU-CHEK AVIVA PLUS test strip  Generic drug:  glucose blood  CHECK BLOOD SUGAR ONCE A DAY OR AS DIRECTED     ACCU-CHEK SOFTCLIX LANCETS lancets     ACCU-CHEK SOFTCLIX LANCETS lancets  Use as instructed     albuterol (2.5 MG/3ML) 0.083% nebulizer solution  Commonly known as:  PROVENTIL  NEBULIZE 1 VIAL FOUR TIMES DAILY AS NEEDED FOR SHORTNESS OF BREATH ORW HEEZING     albuterol 108 (90 Base) MCG/ACT inhaler  Commonly known as:  PROAIR HFA  2 PUFFS EVERY 6 HOURS AS NEEDED FOR WHEEZING     ALPRAZolam 0.5 MG tablet  Commonly known as:  XANAX  Take 1 tablet (0.5 mg total) by mouth 2 (two) times daily as needed.     aspirin 81 MG EC tablet  Take 81 mg by mouth daily.     atorvastatin 40 MG tablet  Commonly known as:  LIPITOR  Take 1 tablet (40 mg total) by mouth daily.     budesonide-formoterol 80-4.5 MCG/ACT inhaler  Commonly known as:  SYMBICORT  Inhale 2 puffs into the lungs 2 (two) times daily.     famotidine 20 MG tablet  Commonly known as:   PEPCID  TAKE  (1)  TABLET TWICE A DAY.     fish oil-omega-3 fatty acids 1000 MG capsule  Take 1-2 g by mouth 2 (two) times daily. Patient takes 2g in the morning and 1g at night     fluticasone 50 MCG/ACT nasal spray  Commonly known as:  FLONASE  Place 1 spray into both nostrils 2 (two) times daily as needed for allergies or rhinitis.     meclizine 25 MG tablet  Commonly known as:  ANTIVERT  TAKE (1) TABLET THREE TIMES DAILY AS NEEDED FOR DIZZINESS.     metFORMIN 500 MG 24 hr tablet  Commonly known as:  GLUCOPHAGE-XR  Take 1 tablet (500 mg total) by mouth 2 (two) times daily.     metoprolol succinate 50 MG 24 hr tablet  Commonly known as:  TOPROL-XL  Take 1 tablet (50 mg total) by mouth daily. Take with or immediately following a meal.     tamsulosin 0.4 MG Caps capsule  Commonly known as:  FLOMAX  TAKE (1) CAPSULE DAILY     Vitamin D 1000 units capsule  Take 1,000 Units by mouth 2 (two) times daily.           Objective:    BP 139/90 mmHg  Pulse 74  Temp(Src) 97 F (36.1 C) (Oral)  Ht  (1.778 m)  Wt 255 lb 9.6 oz (115.939 kg)  BMI 36.67 kg/m2  Wt Readings from Last 3 Encounters:  04/23/16 255 lb 9.6 oz (115.939 kg)  01/23/16 251 lb 12.8 oz (114.216 kg)  12/03/15 250 lb 3.2 oz (113.49 kg)    Physical Exam  Constitutional: He is oriented to person, place, and time. He appears well-developed and well-nourished. No distress.  Eyes: Conjunctivae and EOM are normal. Pupils are equal, round, and reactive to light. Right eye exhibits no discharge. No scleral icterus.  Neck: Neck supple. No thyromegaly present.  Cardiovascular: Normal rate, regular rhythm, normal heart sounds and intact distal pulses.   No murmur heard. Pulmonary/Chest: Effort normal and breath sounds normal. No respiratory distress. He has no wheezes.  Musculoskeletal: Normal range of motion. He exhibits no edema.  Lymphadenopathy:    He has no cervical adenopathy.  Neurological: He is alert and  oriented to person, place, and time. Coordination normal.  Skin: Skin is warm and dry. No rash noted. He is not diaphoretic.  Psychiatric: His speech is normal and behavior is normal. Judgment and thought content normal. His mood appears anxious. He does not exhibit a depressed mood. He expresses no suicidal ideation. He expresses no suicidal plans.  Vitals reviewed.     Assessment & Plan:   Problem List Items Addressed This Visit      Cardiovascular and Mediastinum   HTN (hypertension)   Relevant Orders  Microalbumin / creatinine urine ratio (Completed)     Endocrine   Type 2 diabetes mellitus (HCC) - Primary   Relevant Medications   metFORMIN (GLUCOPHAGE-XR) 500 MG 24 hr tablet   Other Relevant Orders   Bayer DCA Hb A1c Waived (Completed)   Microalbumin / creatinine urine ratio (Completed)     Other   Anxiety   Relevant Medications   ALPRAZolam (XANAX) 0.5 MG tablet   Hyperlipemia       Follow up plan: Return in about 3 months (around 07/23/2016), or if symptoms worsen or fail to improve, for Diabetes recheck.  Counseling provided for all of the vaccine components Orders Placed This Encounter  Procedures  . Bayer DCA Hb A1c Waived  . Microalbumin / creatinine urine ratio    Arville CareJoshua Dettinger, MD [facility] 04/23/2016, 8:51 AM

## 2016-04-24 LAB — MICROALBUMIN / CREATININE URINE RATIO
CREATININE, UR: 166.9 mg/dL
MICROALB/CREAT RATIO: 7 mg/g{creat} (ref 0.0–30.0)
MICROALBUM., U, RANDOM: 11.7 ug/mL

## 2016-05-19 DIAGNOSIS — J449 Chronic obstructive pulmonary disease, unspecified: Secondary | ICD-10-CM | POA: Diagnosis not present

## 2016-05-28 ENCOUNTER — Telehealth: Payer: Self-pay | Admitting: Family

## 2016-05-28 NOTE — Telephone Encounter (Signed)
Spoke to pt

## 2016-06-19 DIAGNOSIS — J449 Chronic obstructive pulmonary disease, unspecified: Secondary | ICD-10-CM | POA: Diagnosis not present

## 2016-07-19 DIAGNOSIS — J449 Chronic obstructive pulmonary disease, unspecified: Secondary | ICD-10-CM | POA: Diagnosis not present

## 2016-07-31 ENCOUNTER — Other Ambulatory Visit: Payer: Self-pay | Admitting: Family Medicine

## 2016-08-04 ENCOUNTER — Telehealth: Payer: Self-pay | Admitting: Family

## 2016-08-04 NOTE — Telephone Encounter (Signed)
Denied.

## 2016-08-19 DIAGNOSIS — J449 Chronic obstructive pulmonary disease, unspecified: Secondary | ICD-10-CM | POA: Diagnosis not present

## 2016-08-20 ENCOUNTER — Encounter: Payer: Self-pay | Admitting: Family Medicine

## 2016-08-20 ENCOUNTER — Telehealth: Payer: Self-pay | Admitting: Family Medicine

## 2016-08-20 ENCOUNTER — Ambulatory Visit (INDEPENDENT_AMBULATORY_CARE_PROVIDER_SITE_OTHER): Payer: Medicare Other | Admitting: Family Medicine

## 2016-08-20 VITALS — BP 133/87 | HR 58 | Temp 97.0°F | Ht 70.0 in | Wt 257.4 lb

## 2016-08-20 DIAGNOSIS — E1165 Type 2 diabetes mellitus with hyperglycemia: Secondary | ICD-10-CM

## 2016-08-20 DIAGNOSIS — I10 Essential (primary) hypertension: Secondary | ICD-10-CM

## 2016-08-20 DIAGNOSIS — E785 Hyperlipidemia, unspecified: Secondary | ICD-10-CM | POA: Diagnosis not present

## 2016-08-20 LAB — BAYER DCA HB A1C WAIVED: HB A1C (BAYER DCA - WAIVED): 7.1 % — ABNORMAL HIGH (ref <7.0)

## 2016-08-20 MED ORDER — METFORMIN HCL ER 500 MG PO TB24: 500.0000 mg | ORAL_TABLET | Freq: Two times a day (BID) | ORAL | 1 refills | Status: DC

## 2016-08-20 NOTE — Telephone Encounter (Signed)
Aware, A1C results.

## 2016-08-20 NOTE — Progress Notes (Signed)
BP 133/87 (BP Location: Left Arm, Patient Position: Sitting, Cuff Size: Large)   Pulse (!) 58   Temp 97 F (36.1 C) (Oral)   Ht _0  (1.778 m)   Wt 257 lb 6.4 oz (116.8 kg)   BMI 36.93 kg/m    Subjective:    Patient ID: Vincent Collins, male    DOB: [birth year], 75 y.o.   MRN: [id number]  HPI: Vincent Collins is a 75 y.o. male presenting on 08/20/2016 for Diabetes (3 month followup, patient is fasting); Hyperlipidemia; and Hypertension   HPI Diabetes type 2 recheck Patient comes in today for a diabetes recheck. He is currently on metformin 500 twice a day. His last hemoglobin A1c was 6.4 and we will continue to monitor by checking again today. Patient is not currently on an ACE inhibitor because of a previous allergic reaction and has no signs of renal disease. Patient is currently on a statin. Patient has not yet seen an ophthalmologist this year because of monetary issues and is trying to get some bills paid off so that he can go do that. He has been trying to lose weight by reducing dietary intake which has been challenging because his wife likes to feed him large plates. He will continue to try to do better on this.  Hyperlipidemia Patient is currently on a statin for hyperlipidemia and it was controlled at his last check the patient is due for recheck today.  Hypertension Patient comes in today for hypertension recheck. He is currently on metoprolol and denies any issues with the medication. His blood pressure today is 133/87 and his heart rate is 58. Patient denies headaches, blurred vision, chest pains, shortness of breath, or weakness. Denies any side effects from medication and is content with current medication.   Relevant past medical, surgical, family and social history reviewed and updated as indicated. Interim medical history since our last visit reviewed. Allergies and medications reviewed and updated.  Review of Systems  Constitutional: Negative for fever.  HENT:  Negative for ear discharge and ear pain.   Eyes: Negative for discharge and visual disturbance.  Respiratory: Negative for shortness of breath and wheezing.   Cardiovascular: Negative for chest pain and leg swelling.  Gastrointestinal: Negative for abdominal pain, constipation and diarrhea.  Genitourinary: Negative for difficulty urinating.  Musculoskeletal: Negative for back pain and gait problem.  Skin: Negative for rash.  Neurological: Negative for syncope, light-headedness and headaches.  All other systems reviewed and are negative.   Per HPI unless specifically indicated above     Medication List       Accurate as of 08/20/16  8:22 AM. Always use your most recent med list.          ACCU-CHEK AVIVA PLUS test strip Generic drug:  glucose blood   ACCU-CHEK SOFTCLIX LANCETS lancets   albuterol (2.5 MG/3ML) 0.083% nebulizer solution Commonly known as:  PROVENTIL NEBULIZE 1 VIAL FOUR TIMES DAILY AS NEEDED FOR SHORTNESS OF BREATH ORW HEEZING   albuterol 108 (90 Base) MCG/ACT inhaler Commonly known as:  PROAIR HFA 2 PUFFS EVERY 6 HOURS AS NEEDED FOR WHEEZING   ALPRAZolam 0.5 MG tablet Commonly known as:  XANAX Take 1 tablet (0.5 mg total) by mouth 2 (two) times daily as needed.   aspirin 81 MG EC tablet Take 81 mg by mouth daily.   atorvastatin 40 MG tablet Commonly known as:  LIPITOR Take 1 tablet (40 mg total) by mouth daily.   budesonide-formoterol 80-4.5 MCG/ACT  inhaler Commonly known as:  SYMBICORT Inhale 2 puffs into the lungs 2 (two) times daily.   famotidine 20 MG tablet Commonly known as:  PEPCID TAKE (1) TABLET TWICE A DAY.   fish oil-omega-3 fatty acids 1000 MG capsule Take 1-2 g by mouth 2 (two) times daily. Patient takes 2g in the morning and 1g at night   fluticasone 50 MCG/ACT nasal spray Commonly known as:  FLONASE Place 1 spray into both nostrils 2 (two) times daily as needed for allergies or rhinitis.   metFORMIN 500 MG 24 hr  tablet Commonly known as:  GLUCOPHAGE-XR Take 1 tablet (500 mg total) by mouth 2 (two) times daily.   metoprolol succinate 50 MG 24 hr tablet Commonly known as:  TOPROL-XL Take 1 tablet (50 mg total) by mouth daily. Take with or immediately following a meal.   tamsulosin 0.4 MG Caps capsule Commonly known as:  FLOMAX TAKE (1) CAPSULE DAILY   Vitamin D 1000 units capsule Take 1,000 Units by mouth 2 (two) times daily.          Objective:    BP 133/87 (BP Location: Left Arm, Patient Position: Sitting, Cuff Size: Large)   Pulse (!) 58   Temp 97 F (36.1 C) (Oral)   Ht _0  (1.778 m)   Wt 257 lb 6.4 oz (116.8 kg)   BMI 36.93 kg/m   Wt Readings from Last 3 Encounters:  08/20/16 257 lb 6.4 oz (116.8 kg)  04/23/16 255 lb 9.6 oz (115.9 kg)  01/23/16 251 lb 12.8 oz (114.2 kg)    Physical Exam  Constitutional: He is oriented to person, place, and time. He appears well-developed and well-nourished. No distress.  Eyes: Conjunctivae and EOM are normal. Pupils are equal, round, and reactive to light. Right eye exhibits no discharge. No scleral icterus.  Neck: Neck supple. No thyromegaly present.  Cardiovascular: Normal rate, regular rhythm, normal heart sounds and intact distal pulses.   No murmur heard. Pulmonary/Chest: Effort normal and breath sounds normal. No respiratory distress. He has no wheezes.  Musculoskeletal: Normal range of motion. He exhibits no edema.  Lymphadenopathy:    He has no cervical adenopathy.  Neurological: He is alert and oriented to person, place, and time. Coordination normal.  Skin: Skin is warm and dry. No rash noted. He is not diaphoretic.  Psychiatric: He has a normal mood and affect. His behavior is normal.  Nursing note and vitals reviewed.   Results for orders placed or performed in visit on 04/23/16  Bayer DCA Hb A1c Waived  Result Value Ref Range   Bayer DCA Hb A1c Waived 7.4 (H) <7.0 %  Microalbumin / creatinine urine ratio  Result Value  Ref Range   Creatinine, Urine 166.9 Not Estab. mg/dL   Microalbum.,U,Random 11.7 Not Estab. ug/mL   MICROALB/CREAT RATIO 7.0 0.0 - 30.0 mg/g creat      Assessment & Plan:   Problem List Items Addressed This Visit      Cardiovascular and Mediastinum   HTN (hypertension) - Primary   Relevant Orders   CMP14+EGFR     Endocrine   Type 2 diabetes mellitus (HCC)   Relevant Medications   metFORMIN (GLUCOPHAGE-XR) 500 MG 24 hr tablet   Other Relevant Orders   CMP14+EGFR   Bayer DCA Hb A1c Waived     Other   Hyperlipemia   Relevant Orders   Lipid panel    Other Visit Diagnoses   None.     Patient declines colonoscopy because  of age and money.   Follow up plan: Return in about 3 months (around 11/20/2016), or if symptoms worsen or fail to improve, for Recheck diabetes and hypertension.  Counseling provided for all of the vaccine components Orders Placed This Encounter  Procedures  . Lipid panel  . CMP14+EGFR  . Bayer [facility] Hb A1c [place], MD [facility] 08/20/2016, 8:22 AM

## 2016-08-21 LAB — CMP14+EGFR
ALBUMIN: 3.7 g/dL (ref 3.5–4.8)
ALK PHOS: 97 IU/L (ref 39–117)
ALT: 28 IU/L (ref 0–44)
AST: 23 IU/L (ref 0–40)
Albumin/Globulin Ratio: 1.5 (ref 1.2–2.2)
BUN / CREAT RATIO: 16 (ref 10–24)
BUN: 16 mg/dL (ref 8–27)
Bilirubin Total: 1.3 mg/dL — ABNORMAL HIGH (ref 0.0–1.2)
CHLORIDE: 102 mmol/L (ref 96–106)
CO2: 24 mmol/L (ref 18–29)
CREATININE: 0.97 mg/dL (ref 0.76–1.27)
Calcium: 8.9 mg/dL (ref 8.6–10.2)
GFR calc Af Amer: 89 mL/min/{1.73_m2} (ref >59)
GFR calc non Af Amer: 77 mL/min/{1.73_m2} (ref >59)
GLUCOSE: 138 mg/dL — AB (ref 65–99)
Globulin, Total: 2.4 g/dL (ref 1.5–4.5)
Potassium: 4.3 mmol/L (ref 3.5–5.2)
Sodium: 139 mmol/L (ref 134–144)
Total Protein: 6.1 g/dL (ref 6.0–8.5)

## 2016-08-21 LAB — LIPID PANEL
CHOLESTEROL TOTAL: 102 mg/dL (ref 100–199)
Chol/HDL Ratio: 2.8 ratio units (ref 0.0–5.0)
HDL: 37 mg/dL — ABNORMAL LOW (ref >39)
LDL CALC: 47 mg/dL (ref 0–99)
TRIGLYCERIDES: 88 mg/dL (ref 0–149)
VLDL Cholesterol Cal: 18 mg/dL (ref 5–40)

## 2016-09-18 ENCOUNTER — Other Ambulatory Visit: Payer: Self-pay | Admitting: Family

## 2016-09-18 ENCOUNTER — Other Ambulatory Visit: Payer: Self-pay | Admitting: Family Medicine

## 2016-09-19 DIAGNOSIS — J449 Chronic obstructive pulmonary disease, unspecified: Secondary | ICD-10-CM | POA: Diagnosis not present

## 2016-10-03 ENCOUNTER — Ambulatory Visit (INDEPENDENT_AMBULATORY_CARE_PROVIDER_SITE_OTHER): Payer: Medicare Other | Admitting: Family Medicine

## 2016-10-03 ENCOUNTER — Encounter: Payer: Self-pay | Admitting: Family Medicine

## 2016-10-03 VITALS — BP 140/86 | HR 71 | Temp 97.0°F | Ht 70.0 in | Wt 256.2 lb

## 2016-10-03 DIAGNOSIS — J441 Chronic obstructive pulmonary disease with (acute) exacerbation: Secondary | ICD-10-CM | POA: Diagnosis not present

## 2016-10-03 MED ORDER — CEFTRIAXONE SODIUM 1 G IJ SOLR
1.0000 g | Freq: Once | INTRAMUSCULAR | Status: AC
  Administered 2016-10-03: 1 g via INTRAMUSCULAR

## 2016-10-03 MED ORDER — METHYLPREDNISOLONE ACETATE 80 MG/ML IJ SUSP
40.0000 mg | Freq: Once | INTRAMUSCULAR | Status: AC
  Administered 2016-10-03: 40 mg via INTRAMUSCULAR

## 2016-10-03 NOTE — Progress Notes (Signed)
BP (!) 148/90   Pulse 71   Temp 97 F (36.1 C) (Oral)   Ht 5\' 10"  (1.778 m)   Wt 256 lb 4 oz (116.2 kg)   BMI 36.77 kg/m    Subjective:    Patient ID: Vincent Collins, male    DOB: November 25, 1941, 75 y.o.   MRN: [id number]  HPI: Vincent Collins is a 75 y.o. male presenting on 10/03/2016 for Cough (pt here today c/o cough and sinus pressure)   HPI Cough and chest congestion and sinus pressure and wheezing Patient has been having cough and chest congestion and wheezing and sinus congestion and postnasal drainage that's been going on for the past few days. He has been trying to use his Flonase and his inhalers and they're helping some but not completely. He denies any fevers or chills or shortness of breath. He says there have been people sick at his church and that's likely where he got it from. He says he usually does get something like this this time here.  Relevant past medical, surgical, family and social history reviewed and updated as indicated. Interim medical history since our last visit reviewed. Allergies and medications reviewed and updated.  Review of Systems  Constitutional: Positive for fever. Negative for chills.  HENT: Positive for congestion, postnasal drip, rhinorrhea, sinus pressure, sneezing and sore throat. Negative for ear discharge, ear pain and voice change.   Eyes: Negative for pain, discharge, redness and visual disturbance.  Respiratory: Positive for cough and wheezing. Negative for chest tightness and shortness of breath.   Cardiovascular: Negative for chest pain and leg swelling.  Musculoskeletal: Negative for gait problem.  Skin: Negative for rash.  All other systems reviewed and are negative.   Per HPI unless specifically indicated above     Medication List       Accurate as of 10/03/16  9:41 AM. Always use your most recent med list.          ACCU-CHEK AVIVA PLUS test strip Generic drug:  glucose blood   ACCU-CHEK SOFTCLIX LANCETS lancets     albuterol (2.5 MG/3ML) 0.083% nebulizer solution Commonly known as:  PROVENTIL NEBULIZE 1 VIAL FOUR TIMES DAILY AS NEEDED FOR SHORTNESS OF BREATH ORW HEEZING   albuterol 108 (90 Base) MCG/ACT inhaler Commonly known as:  PROAIR HFA 2 PUFFS EVERY 6 HOURS AS NEEDED FOR WHEEZING   aspirin 81 MG EC tablet Take 81 mg by mouth daily.   atorvastatin 40 MG tablet Commonly known as:  LIPITOR Take 1 tablet (40 mg total) by mouth daily.   budesonide-formoterol 80-4.5 MCG/ACT inhaler Commonly known as:  SYMBICORT Inhale 2 puffs into the lungs 2 (two) times daily.   famotidine 20 MG tablet Commonly known as:  PEPCID TAKE (1) TABLET TWICE A DAY.   fish oil-omega-3 fatty acids 1000 MG capsule Take 1-2 g by mouth 2 (two) times daily. Patient takes 2g in the morning and 1g at night   fluticasone 50 MCG/ACT nasal spray Commonly known as:  FLONASE Place 1 spray into both nostrils 2 (two) times daily as needed for allergies or rhinitis.   meclizine 25 MG tablet Commonly known as:  ANTIVERT TAKE (1) TABLET THREE TIMES DAILY AS NEEDED FOR DIZZINESS.   metFORMIN 500 MG 24 hr tablet Commonly known as:  GLUCOPHAGE-XR Take 1 tablet (500 mg total) by mouth 2 (two) times daily.   metoprolol succinate 50 MG 24 hr tablet Commonly known as:  TOPROL-XL Take 1 tablet (50 mg  total) by mouth daily. Take with or immediately following a meal.   tamsulosin 0.4 MG Caps capsule Commonly known as:  FLOMAX TAKE (1) CAPSULE DAILY   Vitamin D 1000 units capsule Take 1,000 Units by mouth 2 (two) times daily.          Objective:    BP (!) 148/90   Pulse 71   Temp 97 F (36.1 C) (Oral)   Ht 5\' 10"  (1.778 m)   Wt 256 lb 4 oz (116.2 kg)   BMI 36.77 kg/m   Wt Readings from Last 3 Encounters:  10/03/16 256 lb 4 oz (116.2 kg)  08/20/16 257 lb 6.4 oz (116.8 kg)  04/23/16 255 lb 9.6 oz (115.9 kg)    Physical Exam  Constitutional: He is oriented to person, place, and time. He appears well-developed  and well-nourished. No distress.  HENT:  Right Ear: Tympanic membrane, external ear and ear canal normal.  Left Ear: Tympanic membrane, external ear and ear canal normal.  Nose: Mucosal edema and rhinorrhea present. No sinus tenderness. No epistaxis. Right sinus exhibits no maxillary sinus tenderness and no frontal sinus tenderness. Left sinus exhibits no maxillary sinus tenderness and no frontal sinus tenderness.  Mouth/Throat: Uvula is midline and mucous membranes are normal. Posterior oropharyngeal edema and posterior oropharyngeal erythema present. No oropharyngeal exudate or tonsillar abscesses.  Eyes: Conjunctivae are normal. Right eye exhibits no discharge. Left eye exhibits no discharge. No scleral icterus.  Neck: Neck supple. No thyromegaly present.  Cardiovascular: Normal rate, regular rhythm, normal heart sounds and intact distal pulses.   No murmur heard. Pulmonary/Chest: Effort normal and breath sounds normal. No respiratory distress. He has no wheezes. He has no rales.  Musculoskeletal: Normal range of motion. He exhibits no edema.  Lymphadenopathy:    He has no cervical adenopathy.  Neurological: He is alert and oriented to person, place, and time. Coordination normal.  Skin: Skin is warm and dry. No rash noted. He is not diaphoretic.  Psychiatric: He has a normal mood and affect. His behavior is normal.  Nursing note and vitals reviewed.     Assessment & Plan:   Problem List Items Addressed This Visit    None    Visit Diagnoses    Obstructive chronic bronchitis with exacerbation (HCC)    -  Primary   Relevant Medications   cefTRIAXone (ROCEPHIN) injection 1 g (Start on 10/03/2016  9:45 AM)   methylPREDNISolone acetate (DEPO-MEDROL) injection 40 mg (Start on 10/03/2016  9:45 AM)       Follow up plan: Return if symptoms worsen or fail to improve.  Counseling provided for all of the vaccine components No orders of the defined types were placed in this  encounter.   Arville Care, MD [facility] 10/03/2016, 9:41 AM

## 2016-10-08 ENCOUNTER — Telehealth: Payer: Self-pay | Admitting: Family

## 2016-10-08 MED ORDER — HYDROCODONE-HOMATROPINE 5-1.5 MG/5ML PO SYRP: 5.0000 mL | ORAL_SOLUTION | Freq: Four times a day (QID) | ORAL | 0 refills | Status: DC | PRN

## 2016-10-08 MED ORDER — AZITHROMYCIN 250 MG PO TABS: ORAL_TABLET | ORAL | 0 refills | Status: DC

## 2016-10-08 NOTE — Telephone Encounter (Signed)
Please advise 

## 2016-10-08 NOTE — Telephone Encounter (Signed)
Patient aware, prescription for Hycodan placed at front desk

## 2016-10-19 DIAGNOSIS — J449 Chronic obstructive pulmonary disease, unspecified: Secondary | ICD-10-CM | POA: Diagnosis not present

## 2016-11-05 ENCOUNTER — Other Ambulatory Visit: Payer: Self-pay | Admitting: Family Medicine

## 2016-11-19 DIAGNOSIS — J449 Chronic obstructive pulmonary disease, unspecified: Secondary | ICD-10-CM | POA: Diagnosis not present

## 2016-11-26 ENCOUNTER — Encounter: Payer: Self-pay | Admitting: Family Medicine

## 2016-11-26 ENCOUNTER — Ambulatory Visit (INDEPENDENT_AMBULATORY_CARE_PROVIDER_SITE_OTHER): Payer: Medicare Other | Admitting: Family Medicine

## 2016-11-26 VITALS — BP 162/88 | HR 54 | Temp 97.5°F | Ht 70.0 in | Wt 255.5 lb

## 2016-11-26 DIAGNOSIS — K2950 Unspecified chronic gastritis without bleeding: Secondary | ICD-10-CM | POA: Diagnosis not present

## 2016-11-26 DIAGNOSIS — E1165 Type 2 diabetes mellitus with hyperglycemia: Secondary | ICD-10-CM | POA: Diagnosis not present

## 2016-11-26 DIAGNOSIS — Z23 Encounter for immunization: Secondary | ICD-10-CM | POA: Diagnosis not present

## 2016-11-26 DIAGNOSIS — I10 Essential (primary) hypertension: Secondary | ICD-10-CM | POA: Diagnosis not present

## 2016-11-26 DIAGNOSIS — E7800 Pure hypercholesterolemia, unspecified: Secondary | ICD-10-CM

## 2016-11-26 LAB — BAYER DCA HB A1C WAIVED: HB A1C: 6.9 % (ref <7.0)

## 2016-11-26 MED ORDER — OMEPRAZOLE 20 MG PO CPDR: 20.0000 mg | DELAYED_RELEASE_CAPSULE | Freq: Every day | ORAL | 3 refills | Status: DC

## 2016-11-26 NOTE — Progress Notes (Signed)
BP (!) 154/80   Pulse (!) 54   Temp 97.5 F (36.4 C) (Oral)   Ht 5\' 10"  (1.778 m)   Wt 255 lb 8 oz (115.9 kg)   BMI 36.66 kg/m    Subjective:    Patient ID: Vincent Collins, male    DOB: 01/10/1941, 75 y.o.   MRN: [id number]  HPI: Vincent EaringJames D Dann is a 75 y.o. male presenting on 11/26/2016 for Diabetes (3 month followup); Hypertension; Hyperlipidemia; and Gastroesophageal Reflux (does not feel that Pepcid 20 mg is helping)   HPI Diabetes recheck Patient is coming today for a diabetes recheck. His blood sugar has been running between 120s and 180s. He says he has been trying to work on diet and trying to lose weight but has gained weight recently with the holidays. He denies any issues with his feet. He has not yet had an ophthalmology exam this year because of monetary issues. Patient is not on an ACE inhibitor due to a possible angioedema reaction. Patient is currently on metformin 500 twice a day. He denies any issues or side effects with the currently.  Hypertension recheck Patient is coming in for a hypertension recheck today. His blood pressure today is 154/80. He says that it has been elevated because he is having his son with 2 grandchildren live with them and this is been causing a lot of stress which is causing his blood pressure to be up a little bit more over the past month. He is currently on metoprolol for his blood pressure. Patient denies headaches, blurred vision, chest pains, shortness of breath, or weakness. Denies any side effects from medication and is content with current medication.   Hyperlipidemia Patient is coming in for a cholesterol recheck as well. He has not been for labs but his last time it looked pretty good. He is currently on Lipitor 40 mg and denies any myalgias or side effects from the medication. He denies any focal numbness or weakness or chest pain.  Heartburn/acid reflux Patient is coming in today for a recheck on his heartburn. He has been on  famotidine and says that it is just not cutting it lately and that he is having some acid reflux almost daily despite the famotidine and would like to see if he could change to something else. His abdominal pain is a burning and then he has a lot of belching and burping and indigestion and feeling bloated. He is having regular bowel movements and no diarrhea. He denies any blood in his stool.  Relevant past medical, surgical, family and social history reviewed and updated as indicated. Interim medical history since our last visit reviewed. Allergies and medications reviewed and updated.  Review of Systems  Constitutional: Negative for chills and fever.  Respiratory: Negative for shortness of breath and wheezing.   Cardiovascular: Negative for chest pain and leg swelling.  Gastrointestinal: Positive for abdominal pain. Negative for blood in stool, constipation, diarrhea, nausea and vomiting.  Musculoskeletal: Negative for back pain and gait problem.  Skin: Negative for rash.  Neurological: Negative for dizziness, weakness, numbness and headaches.  All other systems reviewed and are negative.   Per HPI unless specifically indicated above     Objective:    BP (!) 154/80   Pulse (!) 54   Temp 97.5 F (36.4 C) (Oral)   Ht 5\' 10"  (1.778 m)   Wt 255 lb 8 oz (115.9 kg)   BMI 36.66 kg/m   Wt Readings from Last 3  Encounters:  11/26/16 255 lb 8 oz (115.9 kg)  10/03/16 256 lb 4 oz (116.2 kg)  08/20/16 257 lb 6.4 oz (116.8 kg)    Physical Exam  Constitutional: He is oriented to person, place, and time. He appears well-developed and well-nourished. No distress.  Eyes: Conjunctivae are normal. Right eye exhibits no discharge. Left eye exhibits no discharge. No scleral icterus.  Neck: Neck supple. No thyromegaly present.  Cardiovascular: Normal rate, regular rhythm, normal heart sounds and intact distal pulses.   No murmur heard. Pulmonary/Chest: Effort normal and breath sounds normal. No  respiratory distress. He has no wheezes. He has no rales.  Abdominal: Soft. Bowel sounds are normal. He exhibits no distension. There is no tenderness (No pain on exam today, no CVA tenderness). There is no rebound and no guarding.  Musculoskeletal: Normal range of motion. He exhibits no edema.  Lymphadenopathy:    He has no cervical adenopathy.  Neurological: He is alert and oriented to person, place, and time. Coordination normal.  Skin: Skin is warm and dry. No rash noted. He is not diaphoretic.  Psychiatric: He has a normal mood and affect. His behavior is normal.  Nursing note and vitals reviewed.  Diabetic Foot Exam - Simple   Simple Foot Form Diabetic Foot exam was performed with the following findings:  Yes 11/26/2016  8:34 AM  Visual Inspection No deformities, no ulcerations, no other skin breakdown bilaterally:  Yes Sensation Testing Intact to touch and monofilament testing bilaterally:  Yes Pulse Check Posterior Tibialis and Dorsalis pulse intact bilaterally:  Yes Comments        Assessment & Plan:   Problem List Items Addressed This Visit      Cardiovascular and Mediastinum   HTN (hypertension) - Primary     Digestive   Gastritis   Relevant Medications   omeprazole (PRILOSEC) 20 MG capsule     Endocrine   Type 2 diabetes mellitus (HCC)   Relevant Orders   Bayer DCA Hb A1c Waived     Other   Hyperlipemia     Continue current medications, switch from famotidine to Prilosec, check hemoglobin A1c today.  Follow up plan: Return in about 3 months (around 02/25/2017), or if symptoms worsen or fail to improve, for Recheck diabetes and hypertension.  Counseling provided for all of the vaccine components Orders Placed This Encounter  Procedures  . [facility] A[facility] Waived    Arville CareJoshua Jewel Mcafee, MD [facility] 11/26/2016, 8:32 AM

## 2016-12-19 ENCOUNTER — Other Ambulatory Visit: Payer: Self-pay | Admitting: Family Medicine

## 2016-12-19 DIAGNOSIS — J019 Acute sinusitis, unspecified: Secondary | ICD-10-CM

## 2016-12-19 DIAGNOSIS — J449 Chronic obstructive pulmonary disease, unspecified: Secondary | ICD-10-CM | POA: Diagnosis not present

## 2016-12-28 IMAGING — US US EXTREM LOW VENOUS*R*
1 series · 13 of 24 positions shown · non-contrast
Comparison: None.

CLINICAL DATA: Right lower extremity edema.



[Series 1: us extrem low venous*right* · 0.10mm/px · 13 of 39 slices shown]
[im 1/39]
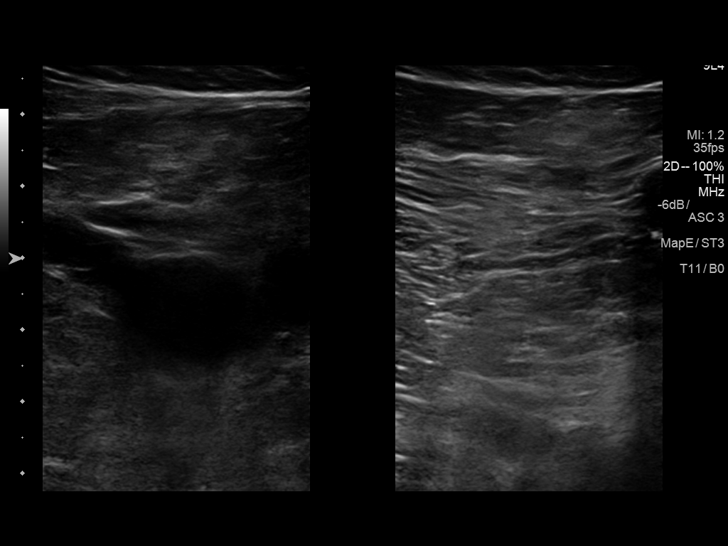
[im 4/39]
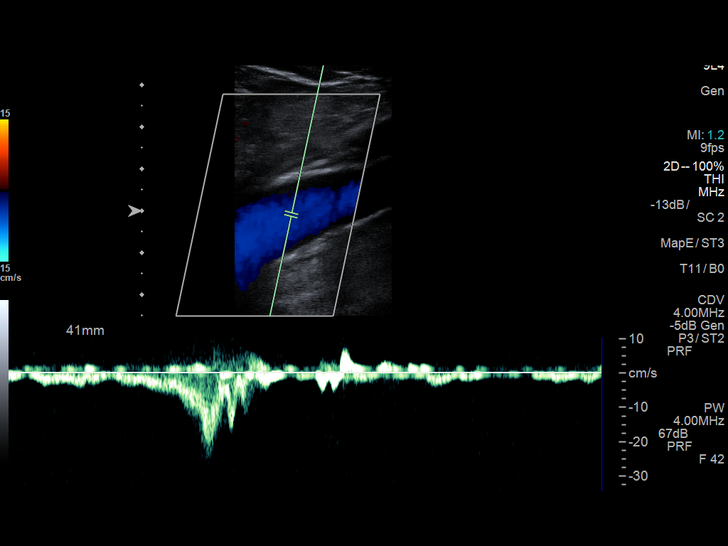
[im 7/39]
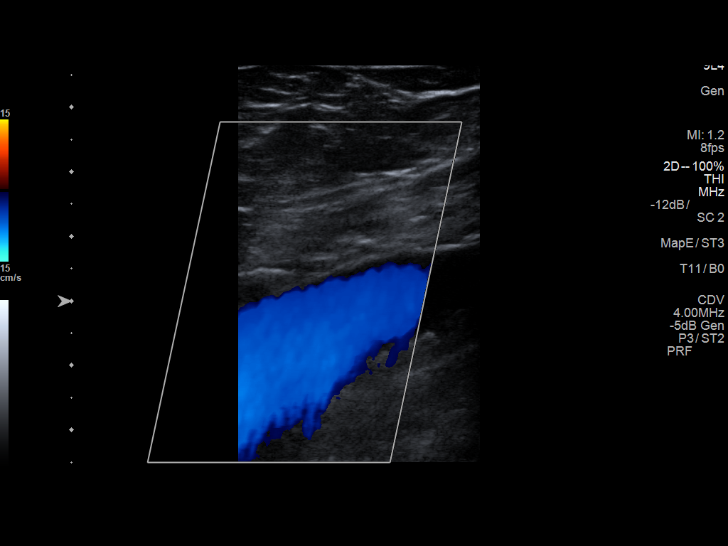
[im 10/39]
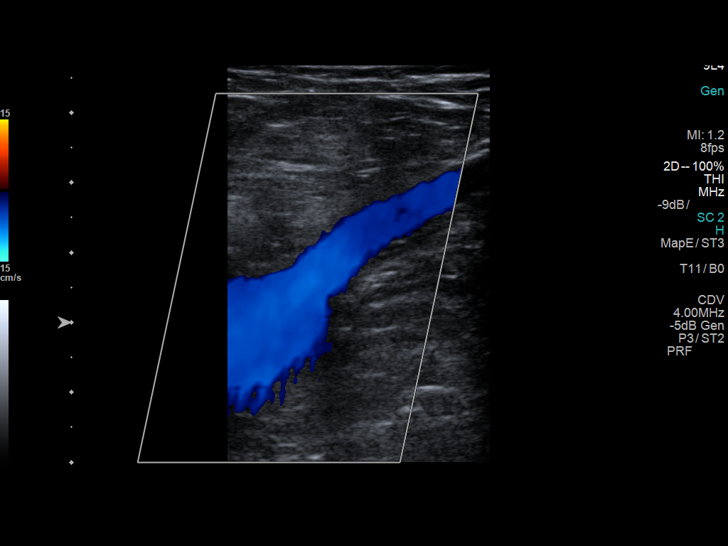
[im 14/39]
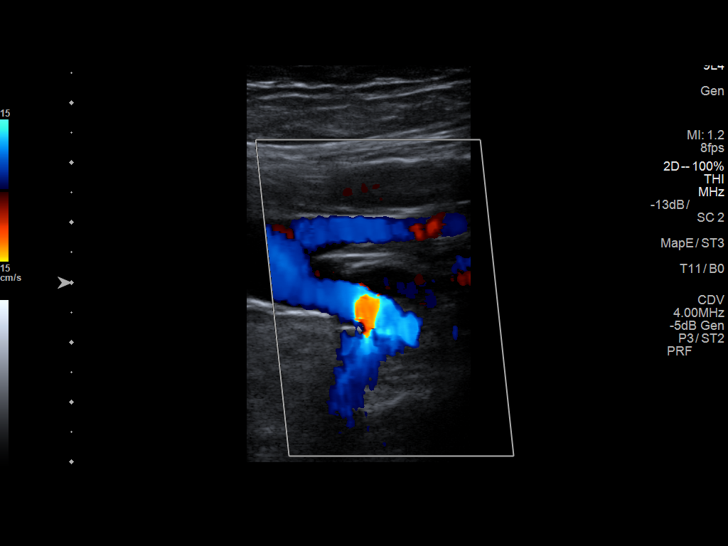
[im 17/39]
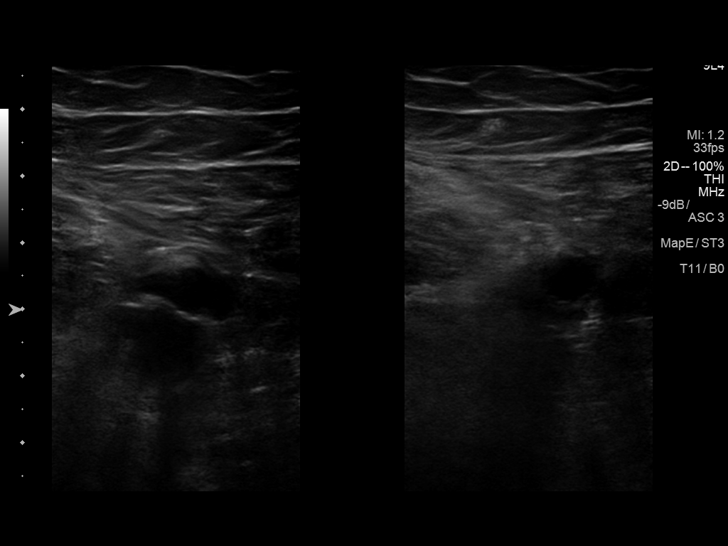
[im 20/39]
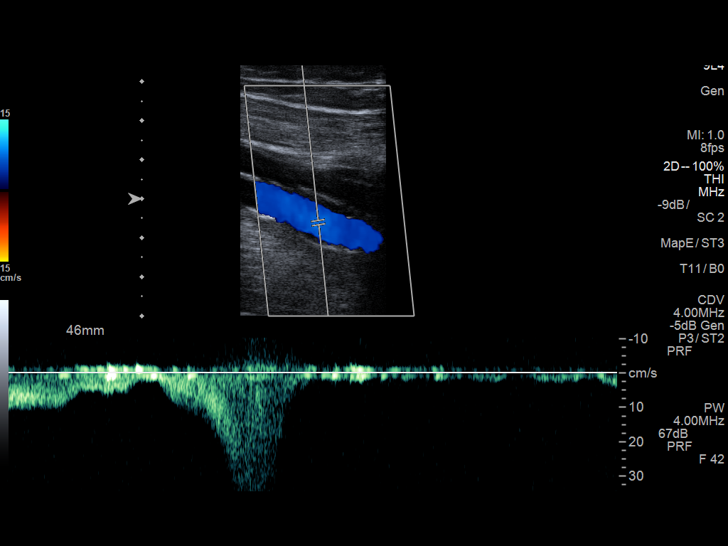
[im 22/39]
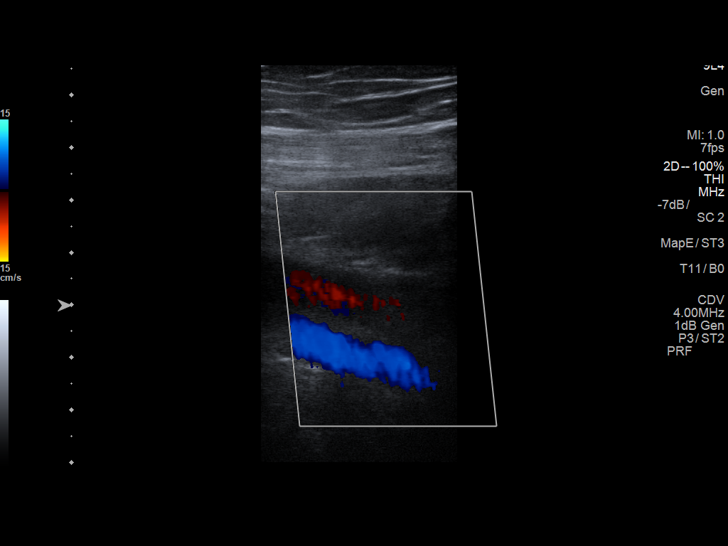
[im 25/39]
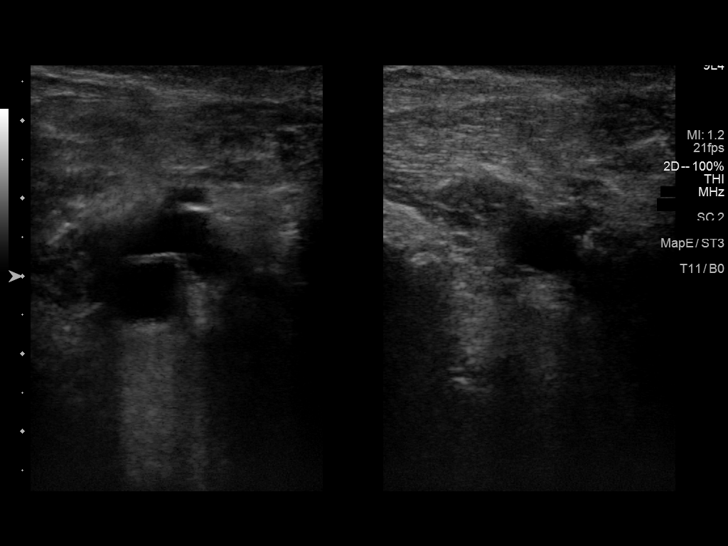
[im 29/39]
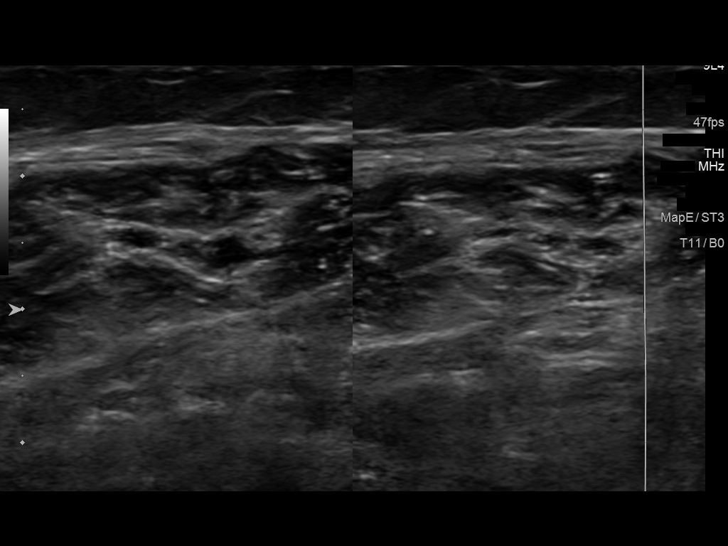
[im 32/39]
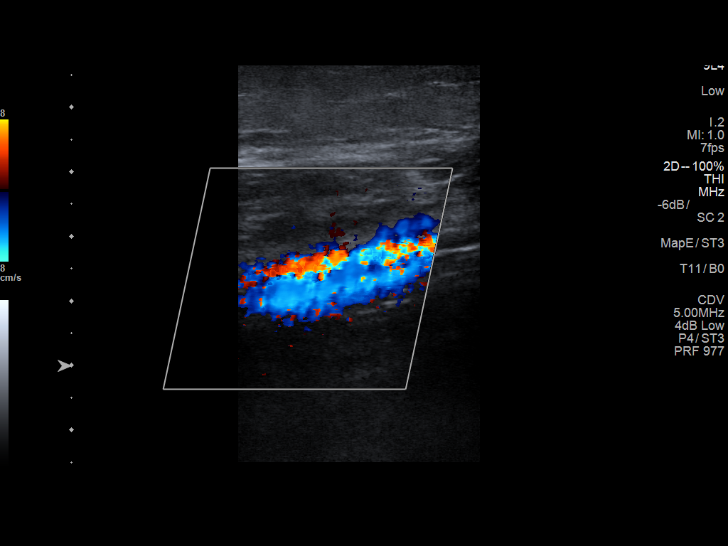
[im 35/39]
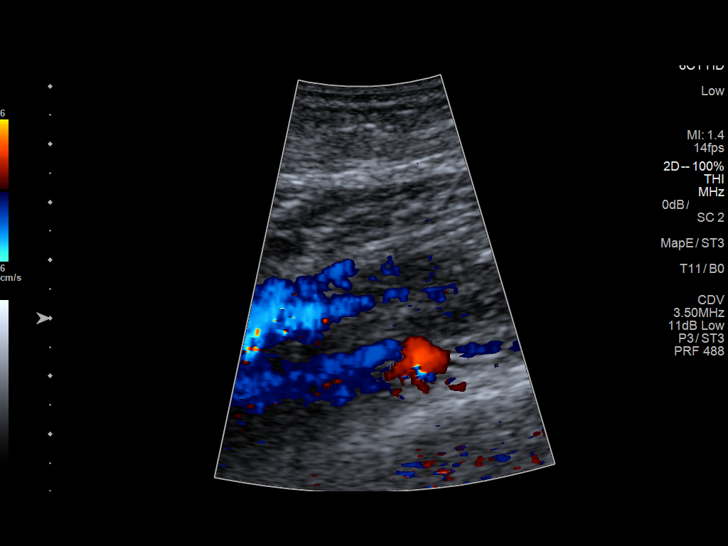
[im 39/39]
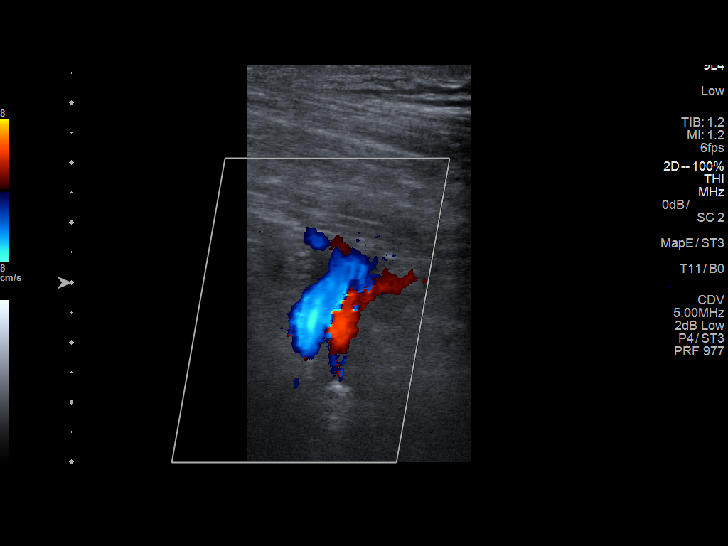

[13 of 24 positions shown; findings below may reference images not displayed]

FINDINGS: Contralateral Common Femoral Vein: Respiratory phasicity is normal
and symmetric with the symptomatic side. No evidence of thrombus.
Normal compressibility.

Common Femoral Vein: No evidence of thrombus. Normal
compressibility, respiratory phasicity and response to augmentation.

Saphenofemoral Junction: No evidence of thrombus. Normal
compressibility and flow on color Doppler imaging.

Profunda Femoral Vein: No evidence of thrombus. Normal
compressibility and flow on color Doppler imaging.

Femoral Vein: No evidence of thrombus. Normal compressibility,
respiratory phasicity and response to augmentation.

Popliteal Vein: No evidence of thrombus. Normal compressibility,
respiratory phasicity and response to augmentation.

Calf Veins: Visualized right deep calf veins are patent without
thrombus.

Superficial Great Saphenous Vein: No evidence of thrombus. Normal
compressibility.
IMPRESSION: Negative for deep venous thrombosis in right lower extremity.

## 2017-01-26 ENCOUNTER — Other Ambulatory Visit: Payer: Self-pay | Admitting: Family Medicine

## 2017-02-20 ENCOUNTER — Other Ambulatory Visit: Payer: Self-pay | Admitting: Family Medicine

## 2017-02-20 DIAGNOSIS — I10 Essential (primary) hypertension: Secondary | ICD-10-CM

## 2017-03-02 ENCOUNTER — Other Ambulatory Visit: Payer: Self-pay | Admitting: Family Medicine

## 2017-03-04 ENCOUNTER — Ambulatory Visit: Payer: Medicare Other | Admitting: Family Medicine

## 2017-03-25 ENCOUNTER — Encounter: Payer: Self-pay | Admitting: Family Medicine

## 2017-03-25 ENCOUNTER — Ambulatory Visit (INDEPENDENT_AMBULATORY_CARE_PROVIDER_SITE_OTHER): Payer: Medicare Other | Admitting: Family Medicine

## 2017-03-25 VITALS — BP 139/90 | HR 79 | Temp 98.0°F | Ht 70.0 in | Wt 261.0 lb

## 2017-03-25 DIAGNOSIS — E1165 Type 2 diabetes mellitus with hyperglycemia: Secondary | ICD-10-CM | POA: Diagnosis not present

## 2017-03-25 DIAGNOSIS — I10 Essential (primary) hypertension: Secondary | ICD-10-CM

## 2017-03-25 DIAGNOSIS — E7800 Pure hypercholesterolemia, unspecified: Secondary | ICD-10-CM

## 2017-03-25 LAB — BAYER DCA HB A1C WAIVED: HB A1C: 7.5 % — AB (ref <7.0)

## 2017-03-25 NOTE — Progress Notes (Signed)
BP (!) 167/85   Pulse 79   Temp 98 F (36.7 C) (Oral)   Ht '5\' 10"'$  (1.778 m)   Wt 261 lb (118.4 kg)   BMI 37.45 kg/m    Subjective:    Patient ID: Vincent Collins, male    DOB: 1941/06/22, 76 y.o.   MRN: [id number]  HPI: Vincent Collins is a 76 y.o. male presenting on 03/25/2017 for Diabetes (3 month followup; patient is fasting); Hyperlipidemia; and Hypertension   HPI Type 2 diabetes mellitus Patient comes in today for recheck of his diabetes. Patient has been currently taking Metformin 500. Patient is not currently on an ACE inhibitor. Patient has not seen an ophthalmologist this year. Patient denies any issues with his feet.   Hypertension follow-up Patient is coming in today for hypertension follow-up. On initial blood pressure was 167/85 on repeat it was 139/80. He does admit that he is fasting so he did not drink anything or take his blood pressure medications this morning. He says normally his blood pressures running well. Patient denies headaches, blurred vision, chest pains, shortness of breath, or weakness. Denies any side effects from medication and is content with current medication.   Hyperlipidemia Patient is coming in for recheck of his hyperlipidemia. He is currently taking Lipitor 40 mg. He denies any issues with myalgias or history of liver damage from it. He denies any focal numbness or weakness or chest pain.   Relevant past medical, surgical, family and social history reviewed and updated as indicated. Interim medical history since our last visit reviewed. Allergies and medications reviewed and updated.  Review of Systems  Constitutional: Negative for chills and fever.  Respiratory: Negative for shortness of breath and wheezing.   Cardiovascular: Negative for chest pain, palpitations and leg swelling.  Musculoskeletal: Negative for back pain and gait problem.  Skin: Negative for rash.  Neurological: Negative for dizziness, weakness, light-headedness, numbness and  headaches.  All other systems reviewed and are negative.   Per HPI unless specifically indicated above      Objective:    BP (!) 167/85   Pulse 79   Temp 98 F (36.7 C) (Oral)   Ht '5\' 10"'$  (1.778 m)   Wt 261 lb (118.4 kg)   BMI 37.45 kg/m   Wt Readings from Last 3 Encounters:  03/25/17 261 lb (118.4 kg)  11/26/16 255 lb 8 oz (115.9 kg)  10/03/16 256 lb 4 oz (116.2 kg)    Physical Exam  Constitutional: He is oriented to person, place, and time. He appears well-developed and well-nourished. No distress.  Eyes: Conjunctivae are normal. No scleral icterus.  Cardiovascular: Normal rate, regular rhythm, normal heart sounds and intact distal pulses.   No murmur heard. Pulmonary/Chest: Effort normal and breath sounds normal. No respiratory distress. He has no wheezes. He has no rales.  Musculoskeletal: Normal range of motion. He exhibits no edema.  Lymphadenopathy:    He has no cervical adenopathy.  Neurological: He is alert and oriented to person, place, and time. Coordination normal.  Skin: Skin is warm and dry. No rash noted. He is not diaphoretic.  Psychiatric: He has a normal mood and affect. His behavior is normal.  Nursing note and vitals reviewed.   Results for orders placed or performed in visit on 11/26/16  Bayer DCA Hb A1c Waived  Result Value Ref Range   Bayer DCA Hb A1c Waived 6.9 <7.0 %      Assessment & Plan:   Problem List Items  Addressed This Visit      Cardiovascular and Mediastinum   HTN (hypertension)   Relevant Orders   CMP14+EGFR     Endocrine   Type 2 diabetes mellitus ([place]) - Primary   Relevant Orders   CMP14+EGFR   Bayer DCA Hb A1c Waived     Other   Hyperlipemia   Relevant Orders   Lipid panel       Follow up plan: Return in about 3 months (around 06/25/2017), or if symptoms worsen or fail to improve, for Recheck diabetes and cholesterol and blood pressure.  Counseling provided for all of the vaccine components Orders Placed This  Encounter  Procedures  . CMP14+EGFR  . Lipid panel  . Bayer [facility] Hb A1c Whitten, MD [facility] 03/25/2017, 8:29 AM

## 2017-03-26 LAB — CMP14+EGFR
A/G RATIO: 2 (ref 1.2–2.2)
ALT: 28 IU/L (ref 0–44)
AST: 22 IU/L (ref 0–40)
Albumin: 3.9 g/dL (ref 3.5–4.8)
Alkaline Phosphatase: 108 IU/L (ref 39–117)
BUN/Creatinine Ratio: 16 (ref 10–24)
BUN: 16 mg/dL (ref 8–27)
Bilirubin Total: 1 mg/dL (ref 0.0–1.2)
CALCIUM: 9.1 mg/dL (ref 8.6–10.2)
CO2: 25 mmol/L (ref 18–29)
Chloride: 99 mmol/L (ref 96–106)
Creatinine, Ser: 1 mg/dL (ref 0.76–1.27)
GFR, EST AFRICAN AMERICAN: 85 mL/min/{1.73_m2} (ref >59)
GFR, EST NON AFRICAN AMERICAN: 74 mL/min/{1.73_m2} (ref >59)
GLOBULIN, TOTAL: 2 g/dL (ref 1.5–4.5)
Glucose: 178 mg/dL — ABNORMAL HIGH (ref 65–99)
POTASSIUM: 4.2 mmol/L (ref 3.5–5.2)
SODIUM: 139 mmol/L (ref 134–144)
Total Protein: 5.9 g/dL — ABNORMAL LOW (ref 6.0–8.5)

## 2017-03-26 LAB — LIPID PANEL
CHOL/HDL RATIO: 2.8 ratio (ref 0.0–5.0)
Cholesterol, Total: 112 mg/dL (ref 100–199)
HDL: 40 mg/dL (ref >39)
LDL CALC: 54 mg/dL (ref 0–99)
TRIGLYCERIDES: 88 mg/dL (ref 0–149)
VLDL Cholesterol Cal: 18 mg/dL (ref 5–40)

## 2017-05-26 ENCOUNTER — Other Ambulatory Visit: Payer: Self-pay | Admitting: Family Medicine

## 2017-05-26 ENCOUNTER — Other Ambulatory Visit: Payer: Self-pay | Admitting: Family

## 2017-05-26 DIAGNOSIS — I10 Essential (primary) hypertension: Secondary | ICD-10-CM

## 2017-06-01 ENCOUNTER — Telehealth: Payer: Self-pay | Admitting: Family

## 2017-06-01 NOTE — Telephone Encounter (Signed)
Scheduled

## 2017-06-17 ENCOUNTER — Ambulatory Visit (INDEPENDENT_AMBULATORY_CARE_PROVIDER_SITE_OTHER): Payer: Medicare Other | Admitting: *Deleted

## 2017-06-17 VITALS — BP 158/79 | HR 64 | Ht 67.5 in | Wt 258.0 lb

## 2017-06-17 DIAGNOSIS — Z0000 Encounter for general adult medical examination without abnormal findings: Secondary | ICD-10-CM | POA: Diagnosis not present

## 2017-06-17 DIAGNOSIS — E1165 Type 2 diabetes mellitus with hyperglycemia: Secondary | ICD-10-CM | POA: Diagnosis not present

## 2017-06-17 NOTE — Progress Notes (Addendum)
Subjective:   Vincent Collins is a 76 y.o. male who presents for an Initial Medicare Annual Wellness Visit. Vincent Collins lives at home with his wife. His son and 2 granddaughters recently moved in with them as well. 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 He works part time for the [employer] and [employer]. He works 6 hrs daily for a week and is off the next week. He has a large yard and enjoys taking care of it and also does some mechanical work as a hobby. He has COPD and finds it hard to do a lot of physical activity in the heat. He tries to walk for at least 15 minutes a day while at work.   Review of Systems  Reports that his health is about the same as last year.  Cardiac Risk Factors include: advanced age (>17men, >52 women);diabetes mellitus;dyslipidemia;male gender;hypertension;obesity (BMI >30kg/m2) (Stays active around his yard and house.)  Musculoskeletal: some joint stiffness after inactivity  Other systems negative Objective:    Today's Vitals   06/17/17 1036 06/17/17 1037  BP: (!) 149/80 (!) 158/79  Pulse: 64   Weight: 258 lb (117 kg)   Height: 5' 7.5" (1.715 m)    Body mass index is 39.81 kg/m.  Current Medications (verified) Outpatient Encounter Prescriptions as of 06/17/2017  Medication Sig  . ACCU-CHEK AVIVA PLUS test strip   . ACCU-CHEK SOFTCLIX LANCETS lancets   . albuterol (PROVENTIL) (2.5 MG/3ML) 0.083% nebulizer solution 1 VIAL 4 TIMES DAILY AS NEEDED FOR SHORTNESS OF BREATH OR WHEEZING  . aspirin 81 MG EC tablet Take 81 mg by mouth daily.    Marland Kitchen atorvastatin (LIPITOR) 40 MG tablet TAKE 1 TABLET DAILY  . Cholecalciferol (VITAMIN D) 1000 UNITS capsule Take 1,000 Units by mouth 2 (two) times daily.   . fish oil-omega-3 fatty acids 1000 MG capsule Take 1-2 g by mouth 2 (two) times daily. Patient takes 2g in the morning and 1g at night  . fluticasone (FLONASE) 50 MCG/ACT nasal spray Place 1 spray into both nostrils 2 (two) times  daily as needed for allergies or rhinitis.  Marland Kitchen meclizine (ANTIVERT) 25 MG tablet TAKE (1) TABLET THREE TIMES DAILY AS NEEDED FOR DIZZINESS.  . metFORMIN (GLUCOPHAGE-XR) 500 MG 24 hr tablet TAKE  (1)  TABLET TWICE A DAY.  . metoprolol succinate (TOPROL-XL) 50 MG 24 hr tablet Take 1 tablet daily. Take with or immediately following a meal.  . omeprazole (PRILOSEC) 20 MG capsule Take 1 capsule (20 mg total) by mouth daily.  . tamsulosin (FLOMAX) 0.4 MG CAPS capsule TAKE (1) CAPSULE DAILY  . [DISCONTINUED] budesonide-formoterol (SYMBICORT) 80-4.5 MCG/ACT inhaler Inhale 2 puffs into the lungs 2 (two) times daily.   No facility-administered encounter medications on file as of 06/17/2017.     Allergies (verified) Ace inhibitors and Diphenhydramine hcl   History: Past Medical History:  Diagnosis Date  . COPD (chronic obstructive pulmonary disease) (HCC)   . Diabetes mellitus without complication (HCC)   . Diverticulitis   . Hydronephrosis of left kidney   . Hyperlipidemia   . Hypertension   . Left ureteral stone    obstructing  . Renal calculus or stone 2015   Past Surgical History:  Procedure Laterality Date  . 2d echo  04/04/09  . CYSTOSCOPY WITH RETROGRADE PYELOGRAM, URETEROSCOPY AND STENT PLACEMENT Left 10/11/2014   Procedure: CYSTOSCOPY WITH RETROGRADE PYELOGRAM, DIAGNOSTIC URETEROSCOPY AND STENT PLACEMENT;  Surgeon: [name], MD;  Location: [facility];  Service: Urology;  Laterality: Left;  . LUMBAR DISC SURGERY    . SPINE SURGERY     Family History  Problem Relation Age of Onset  . Early death Father        MI age 75  . Heart attack Father   . COPD Brother   . Cancer Sister    Social History   Occupational History  . Not on file.   Social History Main Topics  . Smoking status: Former Smoker    Quit date: 06/18/1967  . Smokeless tobacco: Former NeurosurgeonUser    Types: Chew  . Alcohol use No  . Drug use: No  . Sexual activity: Not Currently   Tobacco  Counseling No tobacco use  Activities of Daily Living In your present state of health, do you have any difficulty performing the following activities: 06/17/2017  Hearing? N  Vision? N  Difficulty concentrating or making decisions? N  Walking or climbing stairs? N  Dressing or bathing? N  Doing errands, shopping? N  Preparing Food and eating ? N  Using the Toilet? N  In the past six months, have you accidently leaked urine? N  Do you have problems with loss of bowel control? N  Managing your Medications? N  Managing your Finances? N  Housekeeping or managing your Housekeeping? N  Some recent data might be hidden    Immunizations and Health Maintenance Immunization History  Administered Date(s) Administered  . DTaP 11/23/2007  . Influenza Whole 09/26/2010  . Influenza,inj,Quad PF,36+ Mos 10/06/2013, 10/02/2014, 10/19/2015, 11/26/2016  . Pneumococcal Conjugate-13 05/09/2015  . Pneumococcal Polysaccharide-23 04/20/2012  . Zoster 05/17/2014   Health Maintenance Due  Topic Date Due  . COLONOSCOPY  06/13/1992  . OPHTHALMOLOGY EXAM  05/26/2015  . URINE MICROALBUMIN  04/23/2017    Patient Care Team: Junie SpencerHawks, Christy A, FNP as PCP - General (Nurse Practitioner)  No hospitalizations, ER visits, or surgeries this past year.    Assessment:   This is a routine wellness examination for Vincent Collins.   Hearing/Vision screen No deficits noted during visit. [facility] 3 [facility] States that he can see well with his glasses on.  Dietary issues and exercise activities discussed: Current Exercise Habits: Home exercise routine (patient stays active in his yard and around his house. He walks for about 15 minutes a day when it's not too hot. ), Type of exercise: walking, Time (Minutes): 15, Frequency (Times/Week): 5, Weekly Exercise (Minutes/Week): 75, Intensity: Mild, Exercise limited by: respiratory conditions(s)  Diet: Eats 3 meals a day- Homecooked  breakfast and supper and a sandwich for lunch. States that he knows he eats too much. His wife is a good cook and fixes his plates for him. She gives him large portions and he doesn't like to leave it on the plate.  Goals    . Exercise 150 minutes per week (moderate activity)          Try to walk for at least 30 minutes daily. Avoid the heat of the day.     . Reduce portion size      Depression Screen PHQ 2/9 Scores 06/17/2017 03/25/2017 11/26/2016 10/03/2016  PHQ - 2 Score 0 0 0 0    Fall Risk Fall Risk  06/17/2017 03/25/2017 11/26/2016 10/03/2016 08/20/2016  Falls in the past year? No No No No No    Cognitive Function: MMSE - Mini Mental State Exam 06/17/2017  Not completed: Unable  to complete  Orientation to time 5  Orientation to Place 4  Registration 3  Attention/ Calculation 0  Attention/Calculation-comments unable to spell  Recall 3  Language- name 2 objects 2  Language- repeat 1  Language- follow 3 step command 3  Language- read & follow direction 0  Language-read & follow direction-comments difficulty with reading and spelling  Write a sentence 0  Write a sentence-comments difficulty with reading and spelling  Copy design 1  Total score 22   Difficult to complete because he has some difficulty with reading and writting    Screening Tests Health Maintenance  Topic Date Due  . COLONOSCOPY  06/13/1992  . OPHTHALMOLOGY EXAM  05/26/2015  . URINE MICROALBUMIN  04/23/2017  . INFLUENZA VACCINE  07/29/2017  . HEMOGLOBIN A1C  09/25/2017  . FOOT EXAM  11/26/2017  . TETANUS/TDAP  05/18/2019  . PNA vac Low Risk Adult  Completed  Declined colonoscopy. FOBT given. Will schedule his own eye exam.     Plan:  Follow up with Dr Dettinger scheduled for 06/24/17. Urine microalbumin ordered today. Advanced Directives given. Return a signed/notarized copy to our office. Return FOBT Schedule eye exam. Ask them to send a copy of the report to our office. Handout given on proper  portion sizes. May want to f/u with Henrene Pastor, PharmD to discuss nutrition.  I have personally reviewed and noted the following in the patient's chart:   . Medical and social history . Use of alcohol, tobacco or illicit drugs  . Current medications and supplements . Functional ability and status . Nutritional status . Physical activity . Advanced directives . List of other physicians . Hospitalizations, surgeries, and ER visits in previous 12 months . Vitals . Screenings to include cognitive, depression, and falls . Referrals and appointments  In addition, I have reviewed and discussed with patient certain preventive protocols, quality metrics, and best practice recommendations. A written personalized care plan for preventive services as well as general preventive health recommendations were provided to patient.     Demetrios Loll, RN   06/17/2017    I have reviewed and agree with the above AWV documentation.   Arville Care, MD [facility] 06/17/2017, 1:36 PM

## 2017-06-17 NOTE — Patient Instructions (Signed)
Mr. Allums , Thank you for taking time to come for your Medicare Wellness Visit. I appreciate your ongoing commitment to your health goals. Please review the following plan we discussed and let me know if I can assist you in the future.   These are the goals we discussed: Goals    . Exercise 150 minutes per week (moderate activity)          Try to walk for at least 30 minutes daily. Avoid the heat of the day.     . Reduce portion size       -Return stool specimen card -Review Advance Directives. Bring a signed/notarized copy to our office.  -Schedule an eye exam and have them send a copy to our office  This is a list of the screening recommended for you and due dates:  Health Maintenance  Topic Date Due  . Colon Cancer Screening  06/13/1992  . Eye exam for diabetics  05/26/2015  . Urine Protein Check  04/23/2017  . Flu Shot  07/29/2017  . Hemoglobin A1C  09/25/2017  . Complete foot exam   11/26/2017  . Tetanus Vaccine  05/18/2019  . Pneumonia vaccines  Completed    Serving Sizes A serving size is a measured amount of food or drink, such as one slice of bread, that has an associated nutrient content. Knowing the serving size of a food or drink can help you determine how much of that food you should consume. What is the size of one serving? The size of one healthy serving depends on the food or drink. To determine a serving size, read the food label. If the food or drink does not have a food label, try to find serving size information online. Or, use the following to estimate the size of one adult serving: Grain 1 slice bread.  bagel.  cup pasta. Vegetable  cup cooked or canned vegetables. 1 cup raw, leafy greens. Fruit  cup canned fruit. 1 medium fruit.  cup dried fruit. Meat and Other Protein Sources 1 oz meat, poultry, or fish.  cup cooked beans. 1 egg.  cup nuts or seeds. 1 Tbsp nut butter.  cup tofu or tempeh. 2 Tbsp hummus. Dairy An individual container of  yogurt (6-8 oz). 1 piece of cheese the size of your thumb (1 oz). 1 cup (8 oz) milk or milk alternative. Fat A piece the size of one dice. 1 tsp soft margarine. 1 Tbsp mayonnaise. 1 tsp vegetable oil. 1 Tbsp regular salad dressing. 2 Tbsp low-fat salad dressing. How many servings should I eat from each food group each day? The following are the suggested number of servings to try and have every day from each food group. You can also look at your eating throughout the week and aim for meeting these requirements on most days for overall healthy eating. Grain 6-8 servings. Try to have half of your grains from whole grains, such as whole wheat bread, corn tortillas, oatmeal, brown rice, whole wheat pasta, and bulgur. Vegetable At least 2-3 servings. Fruit 2 servings. Meat and Other Protein Foods 5-6 servings. Aim to have lean proteins, such as chicken, [place], fish, beans, or tofu. Dairy 3 servings. Choose low-fat or nonfat if you are trying to control your weight. Fat 2-3 servings. Is a serving the same thing as a portion? No. A portion is the actual amount you eat, which may be more than one serving. Knowing the specific serving size of a food and the nutritional information that  goes with it can help you make a healthy decision on what size portion to eat. What are some tips to help me learn healthy serving sizes?  Check food labels for serving sizes. Many foods that come as a single portion actually contain multiple servings.  Determine the serving size of foods you commonly eat and figure out how large a portion you usually eat.  Measure the number of servings that can be held by the bowls, glasses, cups, and plates you typically use. For example, pour your breakfast cereal into your regular bowl and then pour it into a measuring cup.  For 1-2 days, measure the serving sizes of all the foods you eat.  Practice estimating serving sizes and determining how big your portions should  be. This information is not intended to replace advice given to you by your health care provider. Make sure you discuss any questions you have with your health care provider. Document Released: 09/13/2003 Document Revised: 08/09/2016 Document Reviewed: 03/14/2014 Elsevier Interactive Patient Education  [employer]2018 [employer].

## 2017-06-18 LAB — MICROALBUMIN / CREATININE URINE RATIO
CREATININE, UR: 192.7 mg/dL
MICROALB/CREAT RATIO: 26.9 mg/g{creat} (ref 0.0–30.0)
Microalbumin, Urine: 51.9 ug/mL

## 2017-06-22 NOTE — Addendum Note (Signed)
Addended by: Gwenith DailyHUDY, Jaylean Buenaventura N on: 06/22/2017 03:28 PM   Modules accepted: Level of Service

## 2017-06-24 ENCOUNTER — Ambulatory Visit (INDEPENDENT_AMBULATORY_CARE_PROVIDER_SITE_OTHER): Payer: Medicare Other | Admitting: Family Medicine

## 2017-06-24 ENCOUNTER — Encounter: Payer: Self-pay | Admitting: Family Medicine

## 2017-06-24 VITALS — BP 161/87 | HR 61 | Temp 97.0°F | Ht 67.5 in | Wt 261.0 lb

## 2017-06-24 DIAGNOSIS — E1169 Type 2 diabetes mellitus with other specified complication: Secondary | ICD-10-CM | POA: Diagnosis not present

## 2017-06-24 DIAGNOSIS — E1165 Type 2 diabetes mellitus with hyperglycemia: Secondary | ICD-10-CM

## 2017-06-24 DIAGNOSIS — E785 Hyperlipidemia, unspecified: Secondary | ICD-10-CM | POA: Diagnosis not present

## 2017-06-24 DIAGNOSIS — I10 Essential (primary) hypertension: Secondary | ICD-10-CM

## 2017-06-24 LAB — BAYER DCA HB A1C WAIVED: HB A1C: 7.4 % — AB (ref <7.0)

## 2017-06-24 MED ORDER — LISINOPRIL 10 MG PO TABS: 10.0000 mg | ORAL_TABLET | Freq: Every day | ORAL | 3 refills | Status: DC

## 2017-06-24 NOTE — Progress Notes (Signed)
BP (!) 174/87   Pulse 64   Temp 97 F (36.1 C) (Oral)   Ht 5' 7.5" (1.715 m)   Wt 261 lb (118.4 kg)   BMI 40.28 kg/m    Subjective:    Patient ID: Vincent Collins, male    DOB: 03-May-1941, 76 y.o.   MRN: [id number]  HPI: Vincent Collins is a 76 y.o. male presenting on 06/24/2017 for Diabetes (followup; patient is fasting); Hyperlipidemia; and Hypertension   HPI Type 2 diabetes mellitus Patient comes in today for recheck of his diabetes. Patient has been currently taking metformin, he says his blood sugars been running mostly below 150 but has had a couple of [place]. Patient is not currently on an ACE inhibitor/ARB but we will start with. Patient has not seen an ophthalmologist this year. Patient denies any issues with their feet.   Hypertension Patient is currently on metoprolol, and their blood pressure today is 174/87. Patient denies any lightheadedness or dizziness. Patient denies headaches, blurred vision, chest pains, shortness of breath, or weakness. Denies any side effects from medication and is content with current medication.   Hyperlipidemia Patient is coming in for recheck of his hyperlipidemia. The patient is currently taking Lipitor and fish oil. They deny any issues with myalgias or history of liver damage from it. They deny any focal numbness or weakness or chest pain.   Relevant past medical, surgical, family and social history reviewed and updated as indicated. Interim medical history since our last visit reviewed. Allergies and medications reviewed and updated.  Review of Systems  Constitutional: Negative for chills and fever.  Eyes: Negative for discharge.  Respiratory: Negative for shortness of breath and wheezing.   Cardiovascular: Negative for chest pain and leg swelling.  Musculoskeletal: Negative for back pain and gait problem.  Skin: Negative for rash.  Neurological: Negative for dizziness, weakness, light-headedness and numbness.  All other systems  reviewed and are negative.   Per HPI unless specifically indicated above     Objective:    BP (!) 174/87   Pulse 64   Temp 97 F (36.1 C) (Oral)   Ht 5' 7.5" (1.715 m)   Wt 261 lb (118.4 kg)   BMI 40.28 kg/m   Wt Readings from Last 3 Encounters:  06/24/17 261 lb (118.4 kg)  06/17/17 258 lb (117 kg)  03/25/17 261 lb (118.4 kg)    Physical Exam  Constitutional: He is oriented to person, place, and time. He appears well-developed and well-nourished. No distress.  Eyes: Conjunctivae are normal. No scleral icterus.  Neck: Neck supple. No thyromegaly present.  Cardiovascular: Normal rate, regular rhythm, normal heart sounds and intact distal pulses.   No murmur heard. Pulmonary/Chest: Effort normal and breath sounds normal. No respiratory distress. He has no wheezes. He has no rales.  Musculoskeletal: Normal range of motion. He exhibits no edema.  Lymphadenopathy:    He has no cervical adenopathy.  Neurological: He is alert and oriented to person, place, and time. Coordination normal.  Skin: Skin is warm and dry. No rash noted. He is not diaphoretic.  Psychiatric: He has a normal mood and affect. His behavior is normal.  Nursing note and vitals reviewed.       Assessment & Plan:   Problem List Items Addressed This Visit      Cardiovascular and Mediastinum   HTN (hypertension)   Relevant Medications   lisinopril (PRINIVIL,ZESTRIL) 10 MG tablet     Endocrine   Hyperlipidemia associated with type  2 diabetes mellitus (HCC)   Relevant Medications   lisinopril (PRINIVIL,ZESTRIL) 10 MG tablet   Type 2 diabetes mellitus (HCC) - Primary   Relevant Medications   lisinopril (PRINIVIL,ZESTRIL) 10 MG tablet   Other Relevant Orders   Bayer DCA Hb A1c Waived       Follow up plan: Return in about 3 months (around 09/24/2017), or if symptoms worsen or fail to improve, for Diabetes and cholesterol and hypertension.  Counseling provided for all of the vaccine components Orders  Placed This Encounter  Procedures  . [facility] Wa[facility]    Arville CareJoshua Travius Crochet, MD [facility] 06/24/2017, 8:22 AM

## 2017-08-18 ENCOUNTER — Other Ambulatory Visit: Payer: Self-pay | Admitting: Family Medicine

## 2017-08-18 ENCOUNTER — Other Ambulatory Visit: Payer: Self-pay | Admitting: Family

## 2017-08-18 DIAGNOSIS — J019 Acute sinusitis, unspecified: Secondary | ICD-10-CM

## 2017-08-18 DIAGNOSIS — I10 Essential (primary) hypertension: Secondary | ICD-10-CM

## 2017-09-28 ENCOUNTER — Other Ambulatory Visit: Payer: Self-pay | Admitting: Family Medicine

## 2017-09-28 NOTE — Telephone Encounter (Signed)
Rx called to pharmacy

## 2017-09-28 NOTE — Telephone Encounter (Signed)
Go ahead and call in a refill for xanax

## 2017-10-07 ENCOUNTER — Ambulatory Visit (INDEPENDENT_AMBULATORY_CARE_PROVIDER_SITE_OTHER): Payer: Medicare Other | Admitting: Family

## 2017-10-07 ENCOUNTER — Encounter: Payer: Self-pay | Admitting: Family

## 2017-10-07 VITALS — BP 160/91 | HR 72 | Temp 96.9°F | Ht 67.5 in | Wt 260.4 lb

## 2017-10-07 DIAGNOSIS — E785 Hyperlipidemia, unspecified: Secondary | ICD-10-CM

## 2017-10-07 DIAGNOSIS — J449 Chronic obstructive pulmonary disease, unspecified: Secondary | ICD-10-CM

## 2017-10-07 DIAGNOSIS — E1169 Type 2 diabetes mellitus with other specified complication: Secondary | ICD-10-CM

## 2017-10-07 DIAGNOSIS — E559 Vitamin D deficiency, unspecified: Secondary | ICD-10-CM

## 2017-10-07 DIAGNOSIS — R351 Nocturia: Secondary | ICD-10-CM | POA: Diagnosis not present

## 2017-10-07 DIAGNOSIS — Z23 Encounter for immunization: Secondary | ICD-10-CM

## 2017-10-07 DIAGNOSIS — N401 Enlarged prostate with lower urinary tract symptoms: Secondary | ICD-10-CM | POA: Diagnosis not present

## 2017-10-07 DIAGNOSIS — F411 Generalized anxiety disorder: Secondary | ICD-10-CM | POA: Diagnosis not present

## 2017-10-07 DIAGNOSIS — K219 Gastro-esophageal reflux disease without esophagitis: Secondary | ICD-10-CM

## 2017-10-07 DIAGNOSIS — I10 Essential (primary) hypertension: Secondary | ICD-10-CM | POA: Diagnosis not present

## 2017-10-07 DIAGNOSIS — E1165 Type 2 diabetes mellitus with hyperglycemia: Secondary | ICD-10-CM

## 2017-10-07 LAB — BAYER DCA HB A1C WAIVED: HB A1C (BAYER DCA - WAIVED): 7.5 % — ABNORMAL HIGH (ref <7.0)

## 2017-10-07 MED ORDER — TAMSULOSIN HCL 0.4 MG PO CAPS: 0.8000 mg | ORAL_CAPSULE | Freq: Every day | ORAL | 0 refills | Status: DC

## 2017-10-07 NOTE — Progress Notes (Signed)
Subjective:    Patient ID: Vincent Collins, male    DOB: 09-06-1941, 76 y.o.   MRN: [id number]  Pt presents to the office today for chronic follow up. Diabetes  He presents for his follow-up diabetic visit. He has type 2 diabetes mellitus. His disease course has been stable. Hypoglycemia symptoms include nervousness/anxiousness. Pertinent negatives for hypoglycemia include no headaches. Pertinent negatives for diabetes include no blurred vision, no foot paresthesias and no visual change. There are no hypoglycemic complications. Symptoms are stable. Pertinent negatives for diabetic complications include no CVA, heart disease, nephropathy or peripheral neuropathy. Risk factors for coronary artery disease include dyslipidemia, diabetes mellitus, male sex, obesity, hypertension and sedentary lifestyle. He is following a generally unhealthy diet. His breakfast blood glucose range is generally 110-130 mg/dl. Eye exam is not current.  Hypertension  This is a chronic problem. The current episode started more than 1 year ago. The problem has been waxing and waning since onset. The problem is uncontrolled. Associated symptoms include anxiety, malaise/fatigue and shortness of breath. Pertinent negatives include no blurred vision, headaches or peripheral edema. Risk factors for coronary artery disease include diabetes mellitus, dyslipidemia, obesity, male gender and sedentary lifestyle. The current treatment provides mild improvement. There is no history of kidney disease, CAD/MI or CVA.  Hyperlipidemia  This is a chronic problem. The current episode started more than 1 year ago. The problem is controlled. Recent lipid tests were reviewed and are normal. Exacerbating diseases include obesity. Associated symptoms include shortness of breath. Current antihyperlipidemic treatment includes statins. The current treatment provides moderate improvement of lipids.  Gastroesophageal Reflux  He complains of heartburn. He  reports no belching or no coughing. This is a chronic problem. The current episode started more than 1 year ago. The problem occurs occasionally. The problem has been waxing and waning. Risk factors include obesity. He has tried a PPI for the symptoms. The treatment provided moderate relief.  Benign Prostatic Hypertrophy  This is a chronic problem. The current episode started more than 1 month ago. The problem has been waxing and waning since onset. Irritative symptoms include frequency and nocturia (8 times). Past treatments include tamsulosin. The treatment provided mild relief.  Anxiety  Presents for follow-up visit. Symptoms include excessive worry, nervous/anxious behavior and shortness of breath. Symptoms occur occasionally. The severity of symptoms is moderate.    COPD  PT taking albuterol as needed. Quit smoking when he was 76 years old.    Review of Systems  Constitutional: Positive for malaise/fatigue.  Eyes: Negative for blurred vision.  Respiratory: Positive for shortness of breath. Negative for cough.   Gastrointestinal: Positive for heartburn.  Genitourinary: Positive for frequency and nocturia (8 times).  Neurological: Negative for headaches.  Psychiatric/Behavioral: The patient is nervous/anxious.   All other systems reviewed and are negative.      Objective:   Physical Exam  Constitutional: He is oriented to person, place, and time. He appears well-developed and well-nourished. No distress.  Morbid obese  HENT:  Head: Normocephalic.  Right Ear: External ear normal.  Left Ear: External ear normal.  Nose: Nose normal.  Mouth/Throat: Oropharynx is clear and moist.  Eyes: Pupils are equal, round, and reactive to light. Right eye exhibits no discharge. Left eye exhibits no discharge.  Neck: Normal range of motion. Neck supple. No thyromegaly present.  Cardiovascular: Normal rate, regular rhythm, normal heart sounds and intact distal pulses.   No murmur  heard. Pulmonary/Chest: Effort normal and breath sounds normal. No respiratory distress.  He has no wheezes.  Abdominal: Soft. Bowel sounds are normal. He exhibits no distension. There is no tenderness.  Musculoskeletal: Normal range of motion. He exhibits no edema or tenderness.  Neurological: He is alert and oriented to person, place, and time.  Skin: Skin is warm and dry. No rash noted. No erythema.  Psychiatric: He has a normal mood and affect. His behavior is normal. Judgment and thought content normal.  Vitals reviewed.     BP (!) 160/91   Pulse 72   Temp (!) 96.9 F (36.1 C) (Oral)   Ht 5' 7.5" (1.715 m)   Wt 260 lb 6.4 oz (118.1 kg)   BMI 40.18 kg/m      Assessment & Plan:  1. Essential hypertension -Pt to stop by office this week after he takes his BP medications Pt states he is fasting this AM and did not take any medications - CMP14+EGFR  2. Chronic obstructive pulmonary disease, unspecified COPD type ([place]) - CMP14+EGFR  3. Type 2 diabetes mellitus with hyperglycemia, without long-term current use of insulin (HCC) - Ambulatory referral to Ophthalmology - Bayer DCA Hb A1c Waived - CMP14+EGFR  4. Vitamin D deficiency - CMP14+EGFR  5. Hyperlipidemia associated with type 2 diabetes mellitus (HCC) - CMP14+EGFR - Lipid panel  6. GAD (generalized anxiety disorder) - CMP14+EGFR  7. Gastroesophageal reflux disease, esophagitis presence not specified - CMP14+EGFR  8. Benign prostatic hyperplasia with nocturia Flomax increased to 0.38m  - tamsulosin (FLOMAX) 0.4 MG CAPS capsule; Take 2 capsules (0.8 mg total) by mouth daily after supper.  Dispense: 180 capsule; Refill: 0   Continue all meds Labs pending Health Maintenance reviewed Diet and exercise encouraged RTO 3 months with PCP  CEvelina Dun FNP

## 2017-10-07 NOTE — Patient Instructions (Signed)

## 2017-10-08 LAB — CMP14+EGFR
A/G RATIO: 2.3 — AB (ref 1.2–2.2)
ALBUMIN: 4.2 g/dL (ref 3.5–4.8)
ALT: 18 IU/L (ref 0–44)
AST: 16 IU/L (ref 0–40)
Alkaline Phosphatase: 100 IU/L (ref 39–117)
BILIRUBIN TOTAL: 1.1 mg/dL (ref 0.0–1.2)
BUN / CREAT RATIO: 23 (ref 10–24)
BUN: 23 mg/dL (ref 8–27)
CALCIUM: 8.8 mg/dL (ref 8.6–10.2)
CHLORIDE: 103 mmol/L (ref 96–106)
CO2: 22 mmol/L (ref 20–29)
Creatinine, Ser: 1.02 mg/dL (ref 0.76–1.27)
GFR, EST AFRICAN AMERICAN: 83 mL/min/{1.73_m2} (ref >59)
GFR, EST NON AFRICAN AMERICAN: 72 mL/min/{1.73_m2} (ref >59)
Globulin, Total: 1.8 g/dL (ref 1.5–4.5)
Glucose: 147 mg/dL — ABNORMAL HIGH (ref 65–99)
POTASSIUM: 4.1 mmol/L (ref 3.5–5.2)
Sodium: 140 mmol/L (ref 134–144)
TOTAL PROTEIN: 6 g/dL (ref 6.0–8.5)

## 2017-10-08 LAB — LIPID PANEL
CHOL/HDL RATIO: 3.3 ratio (ref 0.0–5.0)
Cholesterol, Total: 115 mg/dL (ref 100–199)
HDL: 35 mg/dL — AB (ref >39)
LDL Calculated: 59 mg/dL (ref 0–99)
Triglycerides: 104 mg/dL (ref 0–149)
VLDL CHOLESTEROL CAL: 21 mg/dL (ref 5–40)

## 2017-11-02 ENCOUNTER — Other Ambulatory Visit: Payer: Self-pay | Admitting: Family Medicine

## 2017-11-02 MED ORDER — ACCU-CHEK AVIVA CONNECT W/DEVICE KIT: 1.0000 | PACK | Freq: Every day | 0 refills | Status: DC

## 2017-11-02 MED ORDER — GLUCOSE BLOOD VI STRP: ORAL_STRIP | 2 refills | Status: DC

## 2017-11-02 NOTE — Telephone Encounter (Signed)
Pt aware Rx sent to pharmacy 

## 2017-11-02 NOTE — Telephone Encounter (Signed)
What is the name of the medication? Needs the complete kit for ACCU-Chek to check blood sugar. Needs everything called in.  Have you contacted your pharmacy to request a refill? YES  Which pharmacy would you like this sent to? [facility]   Patient notified that their request is being sent to the clinical staff for review and that they should receive a call once it is complete. If they do not receive a call within 24 hours they can check with their pharmacy or our office.

## 2017-11-16 ENCOUNTER — Other Ambulatory Visit: Payer: Self-pay | Admitting: Family Medicine

## 2017-11-16 DIAGNOSIS — I10 Essential (primary) hypertension: Secondary | ICD-10-CM

## 2017-11-16 DIAGNOSIS — K2950 Unspecified chronic gastritis without bleeding: Secondary | ICD-10-CM

## 2017-12-16 ENCOUNTER — Ambulatory Visit (INDEPENDENT_AMBULATORY_CARE_PROVIDER_SITE_OTHER): Payer: Medicare Other | Admitting: Family Medicine

## 2017-12-16 ENCOUNTER — Telehealth: Payer: Self-pay | Admitting: Family Medicine

## 2017-12-16 ENCOUNTER — Ambulatory Visit (INDEPENDENT_AMBULATORY_CARE_PROVIDER_SITE_OTHER): Payer: Medicare Other

## 2017-12-16 ENCOUNTER — Encounter: Payer: Self-pay | Admitting: Family Medicine

## 2017-12-16 ENCOUNTER — Ambulatory Visit (HOSPITAL_COMMUNITY)
Admission: RE | Admit: 2017-12-16 | Discharge: 2017-12-16 | Disposition: A | Discharge status: Home / Self Care | Payer: Medicare Other | Source: Ambulatory Visit | Attending: Family Medicine | Admitting: Family Medicine

## 2017-12-16 ENCOUNTER — Other Ambulatory Visit: Payer: Self-pay

## 2017-12-16 VITALS — BP 145/76 | HR 80 | Temp 97.6°F | Ht 67.0 in | Wt 258.0 lb

## 2017-12-16 DIAGNOSIS — R600 Localized edema: Secondary | ICD-10-CM

## 2017-12-16 DIAGNOSIS — M25571 Pain in right ankle and joints of right foot: Secondary | ICD-10-CM

## 2017-12-16 DIAGNOSIS — S93491A Sprain of other ligament of right ankle, initial encounter: Secondary | ICD-10-CM

## 2017-12-16 DIAGNOSIS — S93409A Sprain of unspecified ligament of unspecified ankle, initial encounter: Secondary | ICD-10-CM | POA: Insufficient documentation

## 2017-12-16 DIAGNOSIS — W19XXXA Unspecified fall, initial encounter: Secondary | ICD-10-CM

## 2017-12-16 IMAGING — DX DG ANKLE COMPLETE 3+V*R*
3 series · 3 of 3 positions shown · non-contrast
Comparison: None.

CLINICAL DATA: Fall 2 weeks ago. Ankle pain and swelling on the
right.

EXAM:
RIGHT ANKLE - COMPLETE 3+ VIEW

[ankle ap]
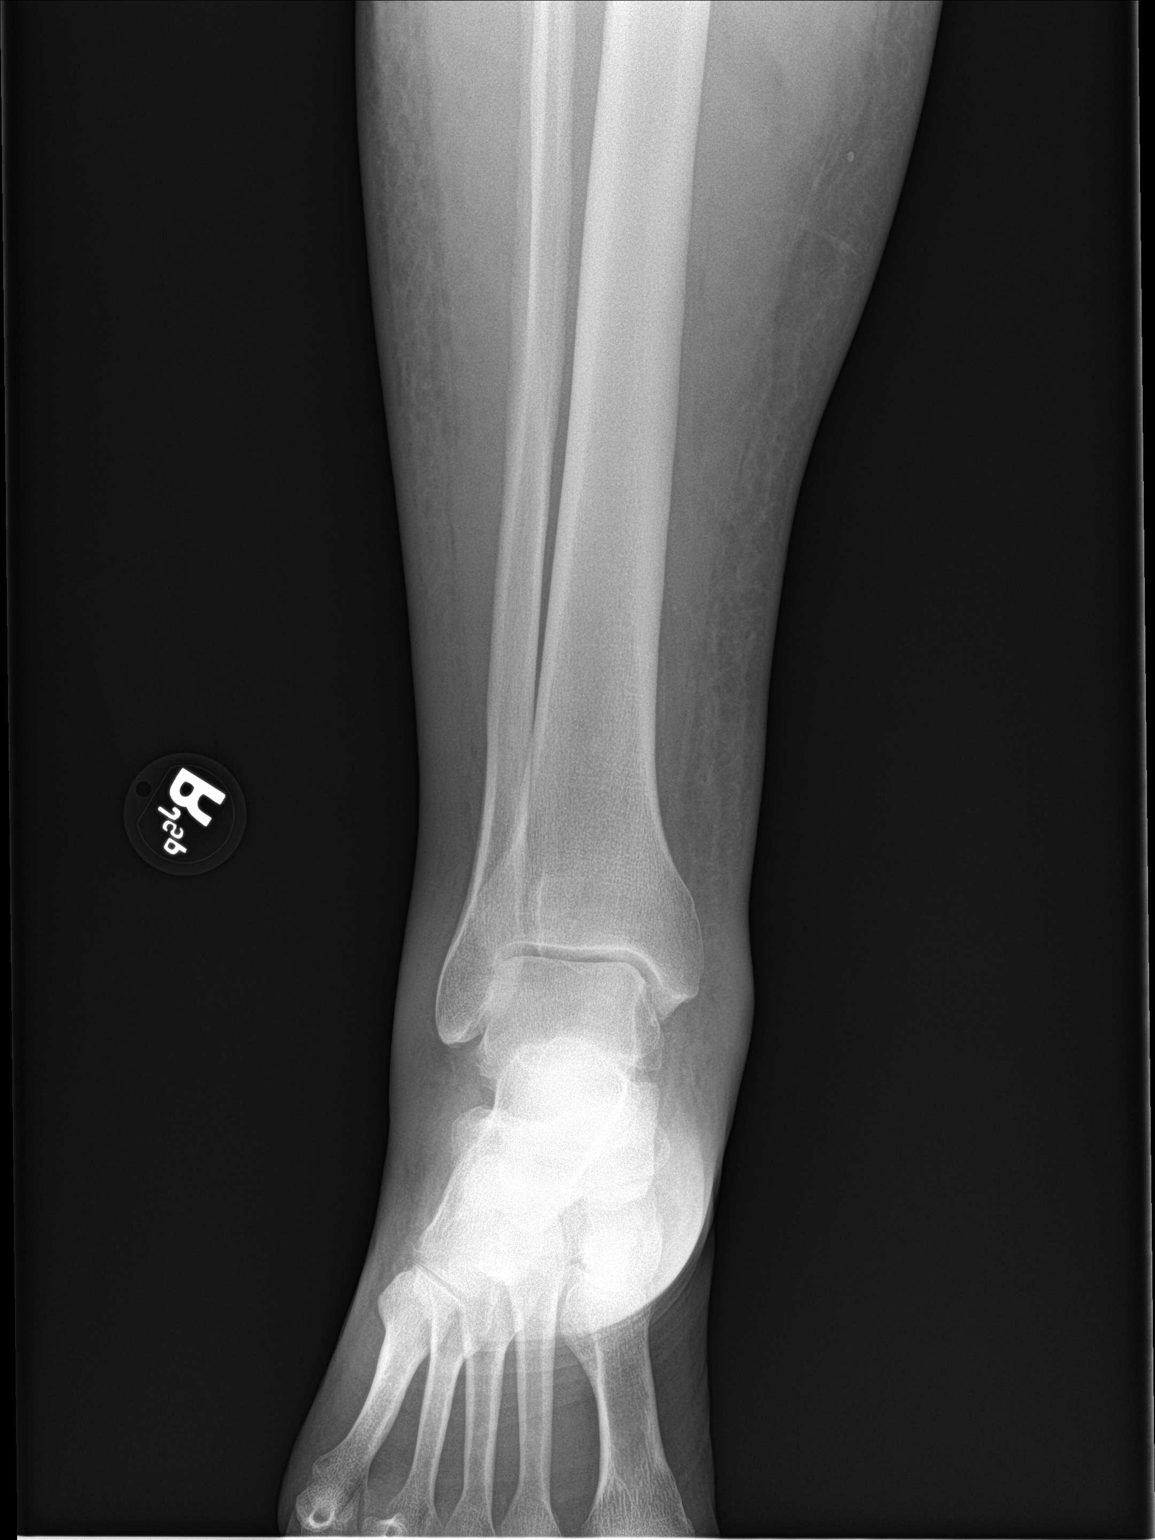

[ankle obl]
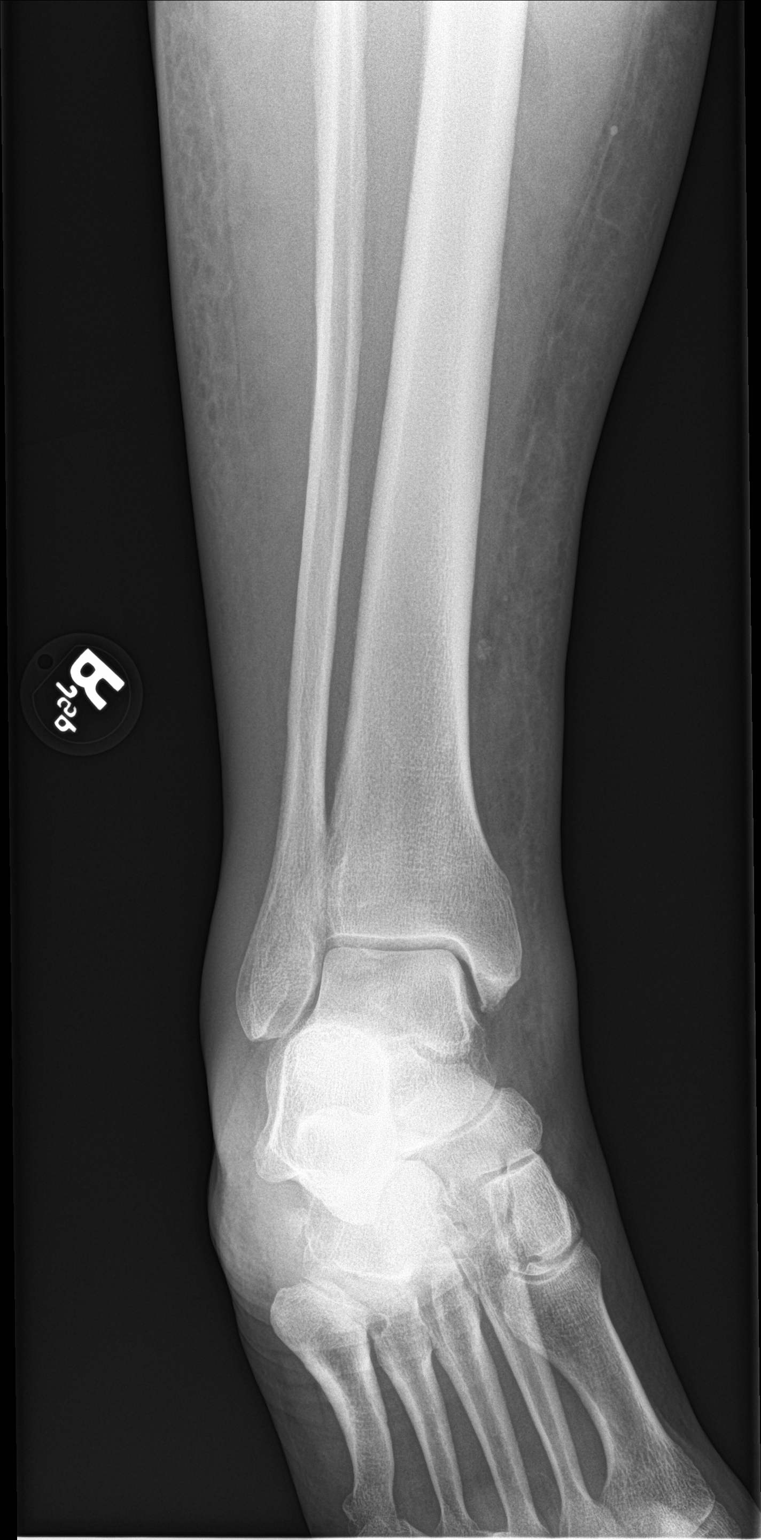

[ankle lat]
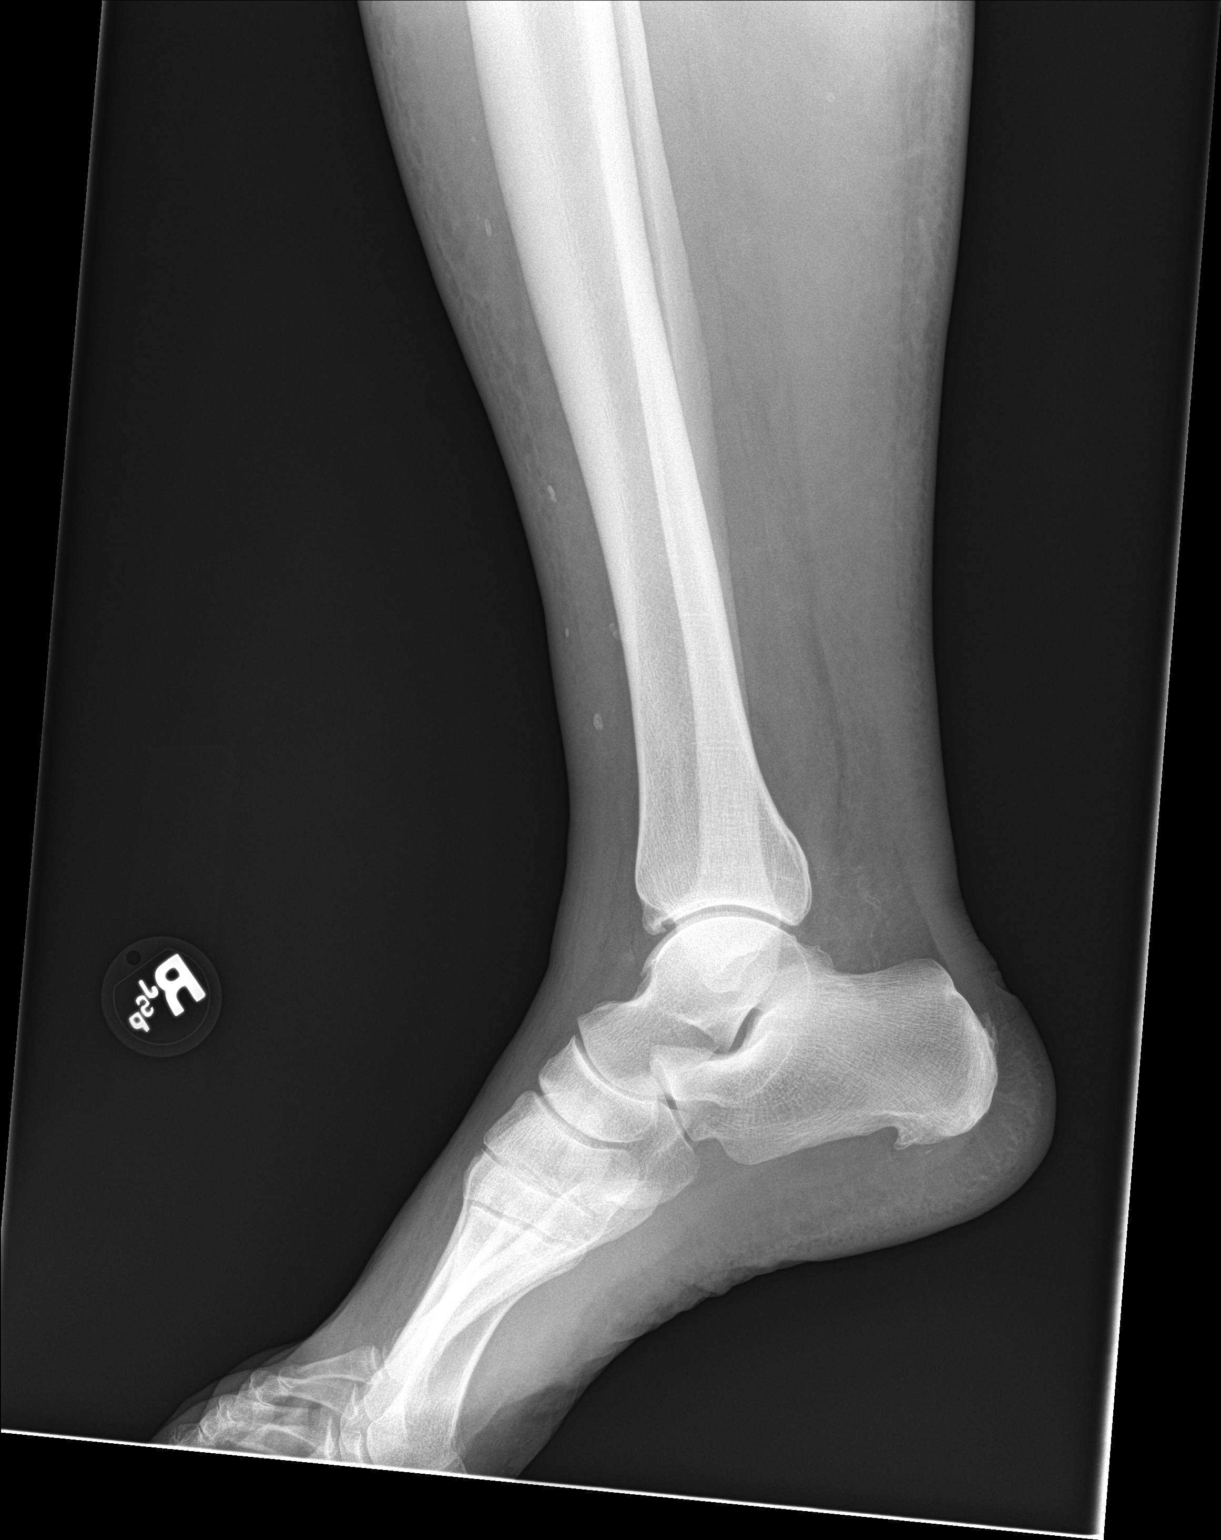

[3 of 3 positions shown; findings below may reference images not displayed]

FINDINGS: Nonspecific soft tissue edema in the lower leg. Venous and arterial
calcifications. No acute fracture or malalignment. No evidence of
ankle joint effusion. Small heel spur.
IMPRESSION: Nonspecific soft tissue edema.  No acute osseous finding.

## 2017-12-16 IMAGING — DX DG TIBIA/FIBULA 2V*R*
2 series · 2 of 2 positions shown · non-contrast
Comparison: None.

CLINICAL DATA: Fall with shin pain and lower extremity edema for 2
weeks.

EXAM:
RIGHT TIBIA AND FIBULA - 2 VIEW

[tibia ap]
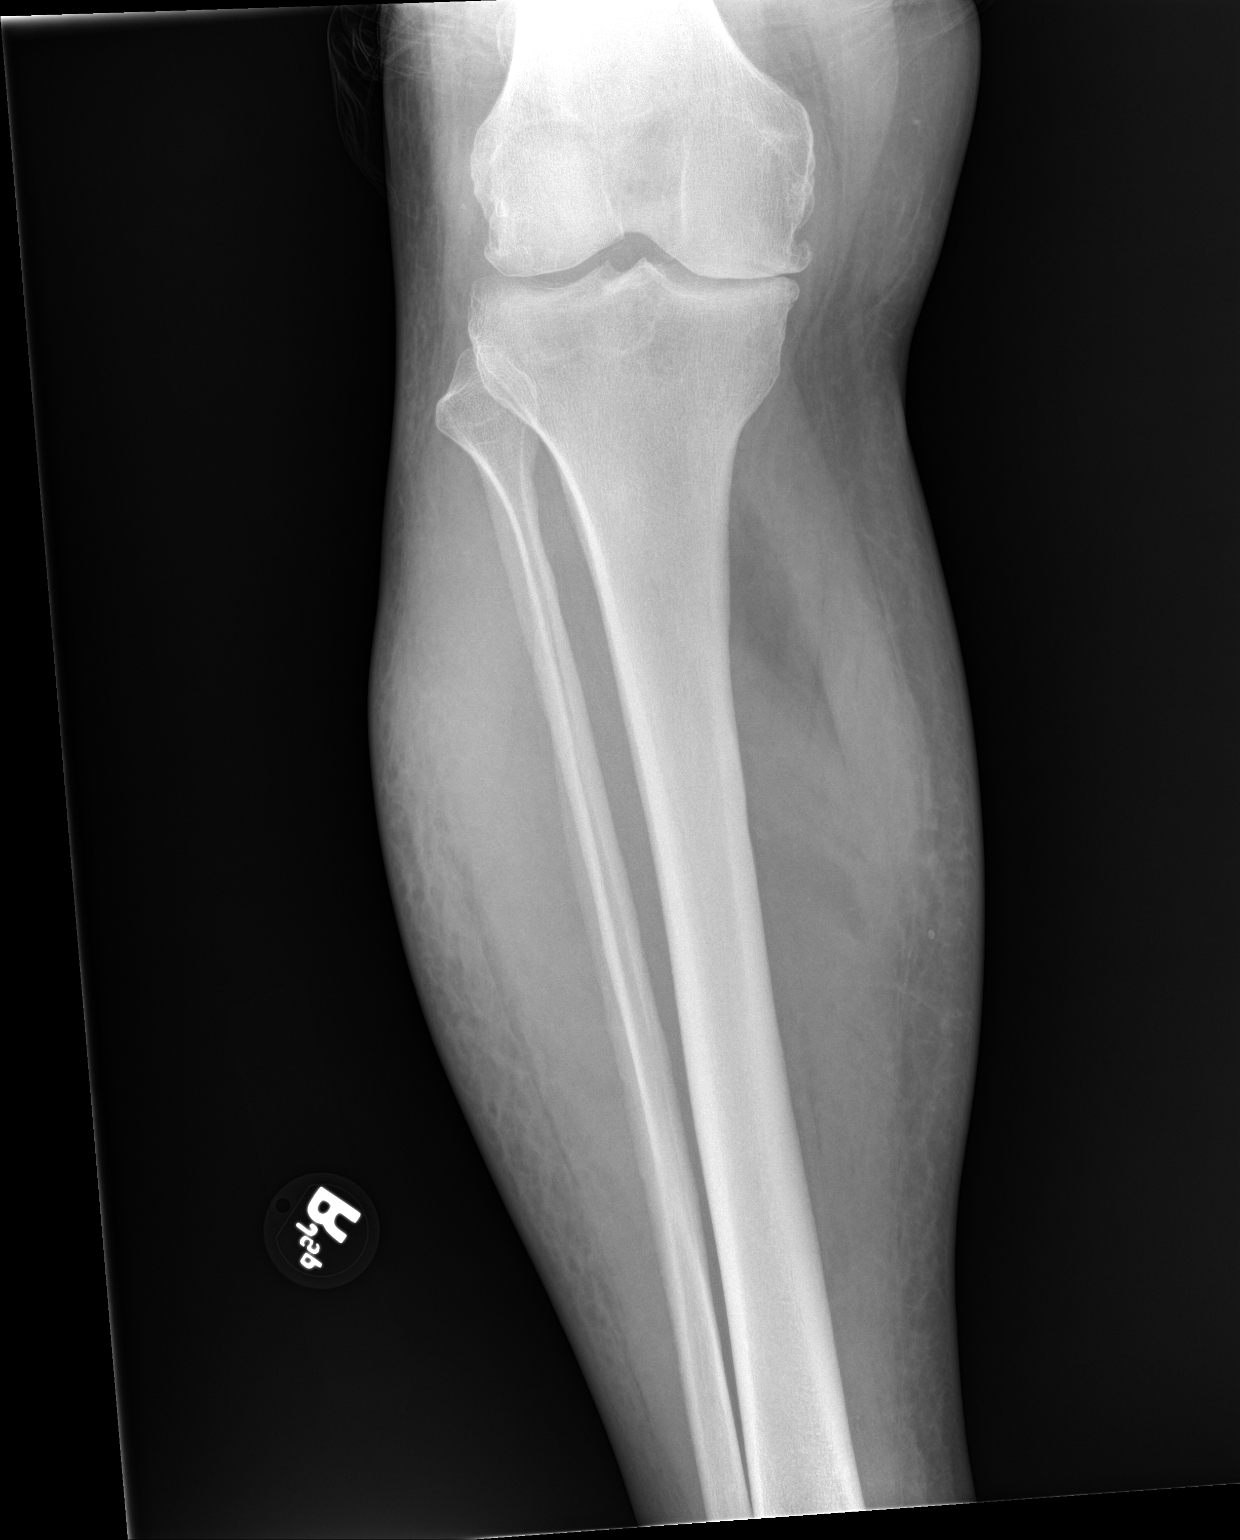

[tibia lat]
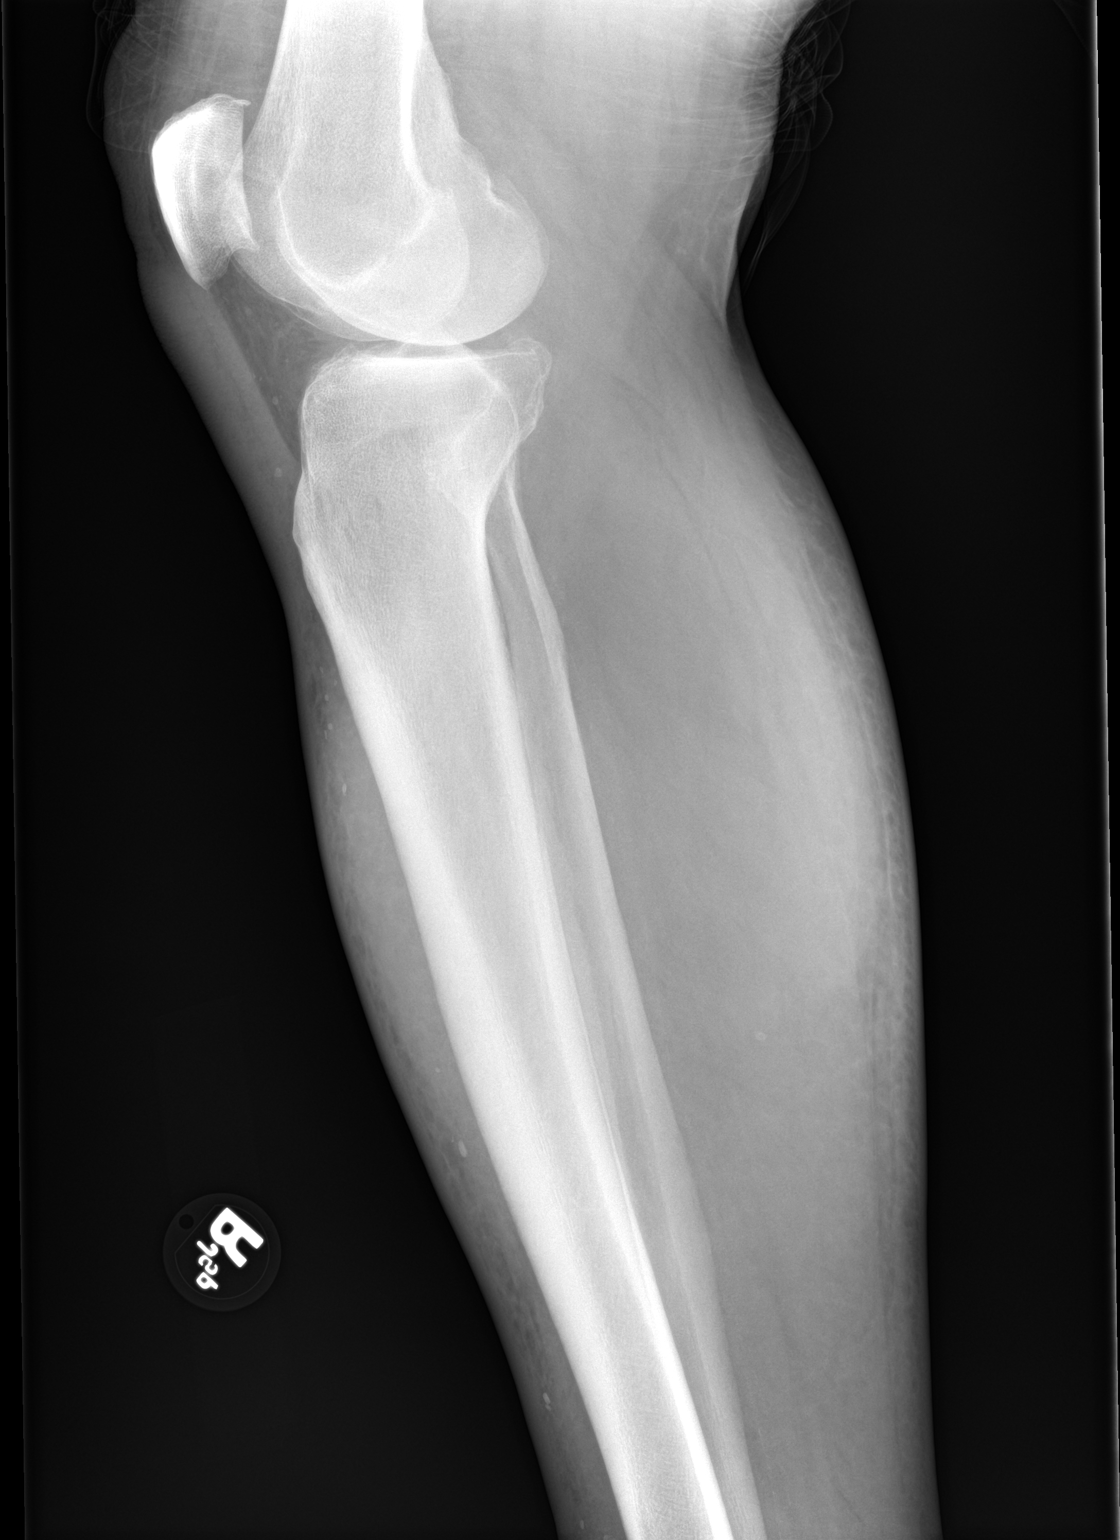

[2 of 2 positions shown; findings below may reference images not displayed]

FINDINGS: Nonspecific soft tissue reticulation in the leg. Venous type
calcifications. No opaque foreign body or soft tissue gas. No
fracture deformity or bone erosion. Knee osteoarthritis with medial
compartment spurring. Equivocal for small joint effusion.
IMPRESSION: Nonspecific soft tissue edema in the leg. No acute osseous finding
or soft tissue gas.

## 2017-12-16 MED ORDER — MELOXICAM 15 MG PO TABS: 15.0000 mg | ORAL_TABLET | Freq: Every day | ORAL | 0 refills | Status: DC

## 2017-12-16 NOTE — Patient Instructions (Addendum)
Your x-rays were negative today for any evidence of fracture.  As we discussed, soft tissue injuries like a sprain would not be evident on x-ray.  Because of your significant swelling of the right lower leg, I have ordered an ultrasound to make sure that there is no evidence of clot formation in your leg.  Once these are resulted, I will call you and get you into orthopedics if needed.  In the meantime, keep the leg elevated.  You may continue to ice the areas that are painful.  You may use Tylenol if needed for pain.   I value your feedback and appreciate you entrusting us with your care.  If you get a survey, I would appreciate your taking the time to let us know what your experience was like.

## 2017-12-16 NOTE — Progress Notes (Signed)
Subjective: CC: injured leg PCP: Dettinger, Fransisca Kaufmann, MD Vincent Collins is a 76 y.o. male presenting to clinic today for:  1. Injury Patient reports that he sustained a right lower leg injury on December 1 after he sustained a mechanical fall.  He notes that he was moving a large piece of furniture downhill when he tripped and fell, rolling his ankle.  He notes a moderate amount of bruising and swelling after the injury occurred.  Bruising has essentially resolved but swelling seems to worsen and is now to his knee on the right side.  Denies substantial pain with walking or standing but notes that some movements can irritate the lower leg.  He has been using ice and soaking his leg in Epsom salt.  He has not taken any over-the-counter medications for this.  He notes that the swelling seems somewhat better each morning but worsens throughout the day.  No numbness or tingling.  No overt weakness.  No chest pain, shortness of breath, heart palpitations, calf pain, pleuritic chest pain or hemoptysis.  No recent travel or prolonged immobilization.  Patient notes that he is very active and continues to work.   ROS: Per HPI  Allergies  Allergen Reactions  . Ace Inhibitors Shortness Of Breath and Swelling  . Diphenhydramine Hcl Other (See Comments)    Elevates heart rate   Past Medical History:  Diagnosis Date  . COPD (chronic obstructive pulmonary disease) ([place])   . Diabetes mellitus without complication ([place])   . Diverticulitis   . Hydronephrosis of left kidney   . Hyperlipidemia   . Hypertension   . Left ureteral stone    obstructing  . Renal calculus or stone 2015    Current Outpatient Medications:  .  ACCU-CHEK SOFTCLIX LANCETS lancets, CHECK BLOOD SUGAR ONCE DAILY OR AS DIRECTED, Disp: 100 each, Rfl: 2 .  albuterol (PROVENTIL) (2.5 MG/3ML) 0.083% nebulizer solution, 1 VIAL 4 TIMES DAILY AS NEEDED FOR SHORTNESS OF BREATH OR WHEEZING, Disp: 360 mL, Rfl: 3 .  ALPRAZolam (XANAX) 0.5  MG tablet, TAKE  (1)  TABLET TWICE A DAY AS NEEDED., Disp: 60 tablet, Rfl: 0 .  aspirin 81 MG EC tablet, Take 81 mg by mouth daily.  , Disp: , Rfl:  .  atorvastatin (LIPITOR) 40 MG tablet, TAKE 1 TABLET DAILY, Disp: 90 tablet, Rfl: 0 .  Blood Glucose Monitoring Suppl (ACCU-CHEK AVIVA CONNECT) w/Device KIT, 1 kit daily by Does not apply route., Disp: 1 kit, Rfl: 0 .  Cholecalciferol (VITAMIN D) 1000 UNITS capsule, Take 1,000 Units by mouth 2 (two) times daily. , Disp: , Rfl:  .  fish oil-omega-3 fatty acids 1000 MG capsule, Take 1-2 g by mouth 2 (two) times daily. Patient takes 2g in the morning and 1g at night, Disp: , Rfl:  .  fluticasone (FLONASE) 50 MCG/ACT nasal spray, Place 1 spray into both nostrils 2 (two) times daily as needed for allergies or rhinitis., Disp: 16 g, Rfl: 2 .  glucose blood (ACCU-CHEK AVIVA PLUS) test strip, [place] to check blood sugars once daily, Disp: 100 each, Rfl: 2 .  lisinopril (PRINIVIL,ZESTRIL) 10 MG tablet, Take 1 tablet (10 mg total) by mouth daily., Disp: 90 tablet, Rfl: 3 .  metFORMIN (GLUCOPHAGE-XR) 500 MG 24 hr tablet, TAKE (1) TABLET TWICE A DAY., Disp: 180 tablet, Rfl: 0 .  metoprolol succinate (TOPROL-XL) 50 MG 24 hr tablet, TAKE 1 TABLET ONCE A DAY WITH A MEAL, Disp: 90 tablet, Rfl: 0 .  omeprazole (  PRILOSEC) 20 MG capsule, Take 1 capsule (20 mg total) by mouth daily., Disp: 90 capsule, Rfl: 0 .  PROAIR HFA 108 (90 Base) MCG/ACT inhaler, 2 PUFFS EVERY 6 HOURS AS NEEDED FOR WHEEZING, Disp: 8.5 g, Rfl: 0 .  tamsulosin (FLOMAX) 0.4 MG CAPS capsule, Take 2 capsules (0.8 mg total) by mouth daily after supper., Disp: 180 capsule, Rfl: 0 Social History   Socioeconomic History  . Marital status: Married    Spouse name: Not on file  . Number of children: Not on file  . Years of education: Not on file  . Highest education level: Not on file  Social Needs  . Financial resource strain: Not on file  . Food insecurity - worry: Not on file  . Food insecurity -  inability: Not on file  . Transportation needs - medical: Not on file  . Transportation needs - non-medical: Not on file  Occupational History  . Not on file  Tobacco Use  . Smoking status: Former Smoker    Last attempt to quit: 06/18/1967    Years since quitting: 50.5  . Smokeless tobacco: Former Systems developer    Types: Chew  Substance and Sexual Activity  . Alcohol use: No  . Drug use: No  . Sexual activity: Not Currently  Other Topics Concern  . Not on file  Social History Narrative  . Not on file   Family History  Problem Relation Age of Onset  . Early death Father        MI age 75  . Heart attack Father   . COPD Brother   . Cancer Sister     Objective: Office vital signs reviewed. BP (!) 145/76   Pulse 80   Temp 97.6 F (36.4 C) (Oral)   Ht _0  (1.702 m)   Wt 258 lb (117 kg)   BMI 40.41 kg/m   Physical Examination:  General: Awake, alert, well nourished, well appearing elderly man, No acute distress HEENT: Normal, MMM Cardio: regular rate and rhythm, S1S2 heard, no murmurs appreciated Pulm: clear to auscultation bilaterally, no wheezes, rhonchi or rales; normal work of breathing on room air Ext: [place] swelling of the right lower extremity to the knee.  Skin is quite tight in this area.  He has a healing excoriation along the medial aspect of the shin which is erythematous but blanches.  He has a similar skin change over the lateral aspect of the right ankle near the ATF.  The area over her ATF is tender.  Distal anterior fibula of the right lower extremity also tender.  There is mild TTP over the cuboid.  Patient has limited active range of motion of the ankle secondary to significant swelling.  Negative Homans.  Some tenderness to palpation of the calf but no palpable cord.  Slight increased warmth of the right lower extremity but no significant erythema or skin discoloration appreciated. Neuro: Light touch sensation grossly intact bilateral lower extremities  Dg  Tibia/fibula Right  Result Date: 12/16/2017 CLINICAL DATA:  Fall with shin pain and lower extremity edema for 2 weeks. EXAM: RIGHT TIBIA AND FIBULA - 2 VIEW COMPARISON:  None. FINDINGS: Nonspecific soft tissue reticulation in the leg. Venous type calcifications. No opaque foreign body or soft tissue gas. No fracture deformity or bone erosion. Knee osteoarthritis with medial compartment spurring. Equivocal for small joint effusion. IMPRESSION: Nonspecific soft tissue edema in the leg. No acute osseous finding or soft tissue gas. Electronically Signed   By: Monte Fantasia  M.D.   On: 12/16/2017 09:02   Dg Ankle Complete Right  Result Date: 12/16/2017 CLINICAL DATA:  Fall 2 weeks ago. Ankle pain and swelling on the right. EXAM: RIGHT ANKLE - COMPLETE 3+ VIEW COMPARISON:  None. FINDINGS: Nonspecific soft tissue edema in the lower leg. Venous and arterial calcifications. No acute fracture or malalignment. No evidence of ankle joint effusion. Small heel spur. IMPRESSION: Nonspecific soft tissue edema.  No acute osseous finding. Electronically Signed   By: Monte Fantasia M.D.   On: 12/16/2017 09:03     Assessment/ Plan: 75 y.o. male   1. Leg edema, right Upon first glance, I was highly concerned for possible DVT despite lack of skin discoloration.  The right lower extremity from the knee down is significantly larger than the left.  Wells score for DVT 3.  Venous ultrasound to evaluate for possible DVT ordered stat.  He has a scheduled at 1245 today at [facility].  Instructions were provided to the patient and we discussed my concern today which he voiced good understanding of.   - DG Ankle Complete Right; Future - DG Tibia/Fibula Right; Future - VAS [place] LOWER EXTREMITY VENOUS (DVT); Future  2. Fall, initial encounter Mechanical. Imaging studies as above. - DG Ankle Complete Right; Future - DG Tibia/Fibula Right; Future - VAS [place] LOWER EXTREMITY VENOUS (DVT); Future  3. Acute right ankle  pain Given significant swelling of the right lower extremity, x-ray was obtained to rule out potential fracture.  Both his ankle x-ray and tib-fib x-rays were negative for any acute bony processes.  Right lower extremity edema was considered to potentially be secondary to a significant ankle sprain.  However, given the almost 3-year week duration of right lower extremity swelling and progressive nature of the swelling, venous ultrasound was obtained as above.  No compression was applied today because DVT could not be ruled out.  If venous ultrasound is negative for evidence of DVT, will proceed with oral anti-inflammatories, recommend compression and place referral to orthopedics for further intervention. - DG Ankle Complete Right; Future - DG Tibia/Fibula Right; Future - VAS [place] LOWER EXTREMITY VENOUS (DVT); Future   Orders Placed This Encounter  Procedures  . DG Ankle Complete Right    Standing Status:   Future    Number of Occurrences:   1    Standing Expiration Date:   02/16/2019    Order Specific Question:   Reason for Exam (SYMPTOM  OR DIAGNOSIS REQUIRED)    Answer:   fall w/ ankle pain and swelling x2 weeks    Order Specific Question:   Preferred imaging location?    Answer:   Internal    Order Specific Question:   Radiology Contrast Protocol - do NOT remove file path    Answer:   [email or web address]  . DG Tibia/Fibula Right    Standing Status:   Future    Number of Occurrences:   1    Standing Expiration Date:   02/16/2019    Order Specific Question:   Reason for Exam (SYMPTOM  OR DIAGNOSIS REQUIRED)    Answer:   fall, right shin pain w/ LE edema x2 weeks    Order Specific Question:   Preferred imaging location?    Answer:   Internal    Order Specific Question:   Radiology Contrast Protocol - do NOT remove file path    Answer:   [email or web address]   Strict return precautions and reasons for  emergent evaluation in the emergency department review  with patient and his wife.  They voiced understanding and will follow-up as needed.  Janora Norlander, DO [place] [phone]

## 2017-12-16 NOTE — Telephone Encounter (Signed)
Reviewed venous ultrasound.  Right lower extremity DVT was ruled out.  We will proceed with treatment for severe ankle sprain.  Meloxicam 15 mg at the pharmacy to take once daily as needed for pain and swelling.  Elevation, compression and rest recommended.  Patient also referred to orthopedics for further evaluation and management.

## 2017-12-25 ENCOUNTER — Other Ambulatory Visit: Payer: Self-pay

## 2017-12-25 DIAGNOSIS — R351 Nocturia: Secondary | ICD-10-CM

## 2017-12-25 DIAGNOSIS — J019 Acute sinusitis, unspecified: Secondary | ICD-10-CM

## 2017-12-25 DIAGNOSIS — N401 Enlarged prostate with lower urinary tract symptoms: Secondary | ICD-10-CM

## 2017-12-25 MED ORDER — TAMSULOSIN HCL 0.4 MG PO CAPS: 0.8000 mg | ORAL_CAPSULE | Freq: Every day | ORAL | 0 refills | Status: DC

## 2017-12-25 MED ORDER — ALBUTEROL SULFATE HFA 108 (90 BASE) MCG/ACT IN AERS: INHALATION_SPRAY | RESPIRATORY_TRACT | 0 refills | Status: DC

## 2017-12-25 MED ORDER — FLUTICASONE PROPIONATE 50 MCG/ACT NA SUSP: 1.0000 | Freq: Two times a day (BID) | NASAL | 2 refills | Status: DC | PRN

## 2017-12-25 MED ORDER — ALBUTEROL SULFATE (2.5 MG/3ML) 0.083% IN NEBU: INHALATION_SOLUTION | RESPIRATORY_TRACT | 3 refills | Status: DC

## 2017-12-25 NOTE — Telephone Encounter (Signed)
Patient is having his rx 's [facility]. Review pended medications and send if approved. Also requesting refill of Alprazolam and meclizine 25mg  (not on med list)

## 2017-12-30 ENCOUNTER — Ambulatory Visit (INDEPENDENT_AMBULATORY_CARE_PROVIDER_SITE_OTHER): Payer: Self-pay

## 2017-12-30 ENCOUNTER — Ambulatory Visit (INDEPENDENT_AMBULATORY_CARE_PROVIDER_SITE_OTHER): Payer: Medicare Other | Admitting: Orthopaedic Surgery

## 2017-12-30 ENCOUNTER — Encounter (INDEPENDENT_AMBULATORY_CARE_PROVIDER_SITE_OTHER): Payer: Self-pay | Admitting: Orthopaedic Surgery

## 2017-12-30 VITALS — Resp 18 | Ht 66.0 in | Wt 256.0 lb

## 2017-12-30 DIAGNOSIS — M79661 Pain in right lower leg: Secondary | ICD-10-CM | POA: Diagnosis not present

## 2017-12-30 DIAGNOSIS — M79671 Pain in right foot: Secondary | ICD-10-CM

## 2017-12-30 NOTE — Progress Notes (Signed)
Office Visit Note   Patient: Vincent Collins           Date of Birth: 01/18/1941           MRN: [id number] Visit Date: 12/30/2017              Requested by: Raliegh Ip, DO [address] [place], [place] [address] PCP: Dettinger, Elige Radon, MD   Assessment & Plan: Visit Diagnoses:  1. Pain in right lower leg   2. Right foot pain     Plan: Soft tissue injury right distal leg with pitting edema. No evidence of bony abnormality. I suspect this will resolve over time. He might want to consider support stockings to help with the edema. No evidence of DVT by ultrasound. Return to the office next 2-3 weeks if no improvement  Follow-Up Instructions: No Follow-up on file.   Orders:  Orders Placed This Encounter  Procedures  . XR Tibia/Fibula Right   No orders of the defined types were placed in this encounter.     Procedures: No procedures performed   Clinical Data: No additional findings.   Subjective: Chief Complaint  Patient presents with  . Right Leg - Edema, Injury    Vincent Collins is q 77 y o here today for Right lower leg pain x 1 month. Injury 11/28/17. Pt was carrying an entertainment center, missed step landed on right foot/leg with   Around December 1 Vincent Collins was carrying a large entertainment center with his son when he slipped and fell down in a bank meant. He believes he internally rotated or inverted his right ankle. He didn't "think much of it" and for several days and was evaluated by Dr. Christell Constant. X-rays were negative. There was concern because of the swelling he might have DVT. Doppler study and [facility] was negative. He still having some swelling and some mild pain and therefore is evaluated. The pain seems to be located proximal to the ankle joint anteriorly no numbness or tingling. He does have history of diabetes and is on metformin. No specific ankle or foot pain  HPI  Review of Systems  Constitutional: Negative for fatigue.  HENT: Negative for hearing  loss.   Respiratory: Negative for apnea, chest tightness and shortness of breath.   Cardiovascular: Positive for leg swelling. Negative for chest pain and palpitations.  Gastrointestinal: Negative for blood in stool, constipation and diarrhea.  Genitourinary: Positive for difficulty urinating.  Musculoskeletal: Positive for joint swelling. Negative for arthralgias, back pain, myalgias, neck pain and neck stiffness.  Neurological: Negative for weakness, numbness and headaches.  Hematological: Does not bruise/bleed easily.  Psychiatric/Behavioral: Negative for sleep disturbance. The patient is not nervous/anxious.      Objective: Vital Signs: Resp 18   Ht 5\' 6"  (1.676 m)   Wt 256 lb (116.1 kg)   BMI 41.32 kg/m   Physical Exam  Ortho Exam awake alert and oriented 3. Comfortable sitting. He has pitting edema from about the mid tibia distally across his foot. No ankle pain medially or laterally at the malleoli or behind either malleolus. Good sensibility. Didn't feel good pulses in the foot was warm. Skin intact. He was an area of tenderness along the anterior tibia that's minimally pink without any ulceration or skin change. This probably represents the area of skin contact with the entertainment center I don't think this represents cellulitis  Specialty Comments:  No specialty comments available.  Imaging: Xr Tibia/fibula Right  Result Date: 12/30/2017  Films of the right tib-fib were obtained in several projections. There are some areas of calcification within the soft tissue consistent with phleboliths. I don't see any abnormality of the distal tibia or fibula. There is an osteophyte along the distal tibia anteriorly at the ankle joint but the joint space is well-maintained    PMFS History: Patient Active Problem List   Diagnosis Date Noted  . Ankle sprain 12/16/2017  . GAD (generalized anxiety disorder) 10/07/2017  . GERD (gastroesophageal reflux disease) 10/07/2017  .  Hydronephrosis of left kidney 09/21/2014  . Left ureteral stone 09/21/2014  . HTN (hypertension) 04/02/2011  . Diverticular disease 04/02/2011  . Gastritis 04/02/2011  . COPD (chronic obstructive pulmonary disease) ([facility]) 04/02/2011  . BPH (benign prostatic hyperplasia) 04/02/2011  . Hyperlipidemia associated with type 2 diabetes mellitus (H[facility]) 04/02/2011  . Vitamin D deficiency 04/02/2011  . Type 2 diabetes mellitus ([facility]) 04/02/2011   Past Medical History:  Diagnosis Date  . COPD (chronic obstructive pulmonary disease) ([facility])   . Diabetes mellitus without complication (H[facility])   . Diverticulitis   . Hydronephrosis of left kidney   . Hyperlipidemia   . Hypertension   . Left ureteral stone    obstructing  . Renal calculus or stone 2015    Family History  Problem Relation Age of Onset  . Early death Father        MI age 76  . Heart attack Father   . COPD Brother   . Cancer Sister     Past Surgical History:  Procedure Laterality Date  . 2d echo  04/04/09  . CYSTOSCOPY WITH RETROGRADE PYELOGRAM, URETEROSCOPY AND STENT PLACEMENT Left 10/11/2014   Procedure: CYSTOSCOPY WITH RETROGRADE PYELOGRAM, DIAGNOSTIC URETEROSCOPY AND STENT PLACEMENT;  Surgeon: Sebastian Acheheodore Manny, MD;  Location: [facility];  Service: Urology;  Laterality: Left;  . LUMBAR DISC SURGERY    . SPINE SURGERY     Social History   Occupational History  . Not on file  Tobacco Use  . Smoking status: Former Smoker    Last attempt to quit: 06/18/1967    Years since quitting: 50.5  . Smokeless tobacco: Former NeurosurgeonUser    Types: Chew  Substance and Sexual Activity  . Alcohol use: No  . Drug use: No  . Sexual activity: Not Currently

## 2018-01-22 ENCOUNTER — Ambulatory Visit (INDEPENDENT_AMBULATORY_CARE_PROVIDER_SITE_OTHER): Payer: Medicare Other | Admitting: Family Medicine

## 2018-01-22 ENCOUNTER — Encounter: Payer: Self-pay | Admitting: Family Medicine

## 2018-01-22 VITALS — BP 166/85 | HR 61 | Temp 97.8°F | Ht 66.0 in | Wt 257.0 lb

## 2018-01-22 DIAGNOSIS — E785 Hyperlipidemia, unspecified: Secondary | ICD-10-CM

## 2018-01-22 DIAGNOSIS — K219 Gastro-esophageal reflux disease without esophagitis: Secondary | ICD-10-CM

## 2018-01-22 DIAGNOSIS — E1169 Type 2 diabetes mellitus with other specified complication: Secondary | ICD-10-CM

## 2018-01-22 DIAGNOSIS — E1159 Type 2 diabetes mellitus with other circulatory complications: Secondary | ICD-10-CM | POA: Diagnosis not present

## 2018-01-22 DIAGNOSIS — I10 Essential (primary) hypertension: Secondary | ICD-10-CM

## 2018-01-22 DIAGNOSIS — E1165 Type 2 diabetes mellitus with hyperglycemia: Secondary | ICD-10-CM | POA: Diagnosis not present

## 2018-01-22 DIAGNOSIS — J449 Chronic obstructive pulmonary disease, unspecified: Secondary | ICD-10-CM | POA: Diagnosis not present

## 2018-01-22 LAB — BAYER DCA HB A1C WAIVED: HB A1C: 7 % — AB (ref <7.0)

## 2018-01-22 MED ORDER — LISINOPRIL 10 MG PO TABS: 10.0000 mg | ORAL_TABLET | Freq: Every day | ORAL | 3 refills | Status: DC

## 2018-01-22 NOTE — Addendum Note (Signed)
Addended by: Quay BurowPOTTER, Mavi Un K on: 01/22/2018 10:35 AM   Modules accepted: Orders

## 2018-01-22 NOTE — Progress Notes (Signed)
BP (!) 162/84   Pulse 61   Temp 97.8 F (36.6 C) (Oral)   Ht _0  (1.676 m)   Wt 257 lb (116.6 kg)   BMI 41.48 kg/m    Subjective:    Patient ID: Vincent Collins, male    DOB: [birth year], 77 y.o.   MRN: [id number]  HPI: Vincent Collins is a 77 y.o. male presenting on 01/22/2018 for Diabetes (6 mo); Hyperlipidemia; and Hypertension (has been out of Lisinopril since end of December)   HPI Type 2 diabetes mellitus Patient comes in today for recheck of his diabetes. Patient has been currently taking metformin. Patient is currently on an ACE inhibitor/ARB. Patient has not seen an ophthalmologist this year. Patient denies any issues with their feet.   Hyperlipidemia Patient is coming in for recheck of his hyperlipidemia. The patient is currently taking Lipitor. They deny any issues with myalgias or history of liver damage from it. They deny any focal numbness or weakness or chest pain.   Hypertension Patient is currently on metoprolol and lisinopril, he has been out of his lisinopril because of a mixup with the pharmacy, and their blood pressure today is 162/84. Patient denies any lightheadedness or dizziness. Patient denies headaches, blurred vision, chest pains, shortness of breath, or weakness. Denies any side effects from medication and is content with current medication.   GERD Patient is currently on omeprazole.  She denies any major symptoms or abdominal pain or belching or burping. She denies any blood in her stool or lightheadedness or dizziness.   Copd Patient denies any major issues with his breathing and says is doing very well right now and has not coughing spells.  He says he has been able to continue working without any issues with.  Relevant past medical, surgical, family and social history reviewed and updated as indicated. Interim medical history since our last visit reviewed. Allergies and medications reviewed and updated.  Review of Systems  Constitutional: Negative  for chills and fever.  Respiratory: Negative for shortness of breath and wheezing.   Cardiovascular: Positive for leg swelling (Patient had accident where he fell and landed on his right lower extremity and has some swelling in that leg since that time, already evaluated for blood clots and fractures). Negative for chest pain.  Gastrointestinal: Negative for abdominal pain.  Musculoskeletal: Negative for back pain and gait problem.  Skin: Negative for rash.  Neurological: Negative for dizziness, weakness, light-headedness and numbness.  All other systems reviewed and are negative.   Per HPI unless specifically indicated above   Allergies as of 01/22/2018      Reactions   Ace Inhibitors Shortness Of Breath, Swelling   Diphenhydramine Hcl Other (See Comments)   Elevates heart rate      Medication List        Accurate as of 01/22/18  9:07 AM. Always use your most recent med list.          ACCU-CHEK AVIVA CONNECT w/Device Kit 1 kit daily by Does not apply route.   ACCU-CHEK SOFTCLIX LANCETS lancets CHECK BLOOD SUGAR ONCE DAILY OR AS DIRECTED   albuterol 108 (90 Base) MCG/ACT inhaler Commonly known as:  PROAIR HFA 2 PUFFS EVERY 6 HOURS AS NEEDED FOR WHEEZING   albuterol (2.5 MG/3ML) 0.083% nebulizer solution Commonly known as:  PROVENTIL 1 VIAL 4 TIMES DAILY AS NEEDED FOR SHORTNESS OF BREATH OR WHEEZING   ALPRAZolam 0.5 MG tablet Commonly known as:  XANAX TAKE  (1)  TABLET  TWICE A DAY AS NEEDED.   aspirin 81 MG EC tablet Take 81 mg by mouth daily.   atorvastatin 40 MG tablet Commonly known as:  LIPITOR TAKE 1 TABLET DAILY   fluticasone 50 MCG/ACT nasal spray Commonly known as:  FLONASE Place 1 spray into both nostrils 2 (two) times daily as needed for allergies or rhinitis.   glucose blood test strip Commonly known as:  ACCU-CHEK AVIVA PLUS [place] to check blood sugars once daily   lisinopril 10 MG tablet Commonly known as:  PRINIVIL,ZESTRIL Take 1 tablet (10 mg  total) by mouth daily.   meloxicam 15 MG tablet Commonly known as:  MOBIC Take 1 tablet (15 mg total) by mouth daily.   metFORMIN 500 MG 24 hr tablet Commonly known as:  GLUCOPHAGE-XR TAKE (1) TABLET TWICE A DAY.   metoprolol succinate 50 MG 24 hr tablet Commonly known as:  TOPROL-XL TAKE 1 TABLET ONCE A DAY WITH A MEAL   omeprazole 20 MG capsule Commonly known as:  PRILOSEC Take 1 capsule (20 mg total) by mouth daily.   tamsulosin 0.4 MG Caps capsule Commonly known as:  FLOMAX Take 2 capsules (0.8 mg total) by mouth daily after supper.   Vitamin D 1000 units capsule Take 1,000 Units by mouth 2 (two) times daily.          Objective:    BP (!) 162/84   Pulse 61   Temp 97.8 F (36.6 C) (Oral)   Ht _0  (1.676 m)   Wt 257 lb (116.6 kg)   BMI 41.48 kg/m   Wt Readings from Last 3 Encounters:  01/22/18 257 lb (116.6 kg)  12/30/17 256 lb (116.1 kg)  12/16/17 258 lb (117 kg)    Physical Exam  Constitutional: He is oriented to person, place, and time. He appears well-developed and well-nourished. No distress.  Eyes: Conjunctivae are normal. No scleral icterus.  Neck: Neck supple. No thyromegaly present.  Cardiovascular: Normal rate, regular rhythm, normal heart sounds and intact distal pulses.  No murmur heard. Pulmonary/Chest: Effort normal and breath sounds normal. No respiratory distress. He has no wheezes. He has no rales.  Musculoskeletal: Normal range of motion. He exhibits edema (Trace edema in right lower extremity). He exhibits no tenderness or deformity.  Lymphadenopathy:    He has no cervical adenopathy.  Neurological: He is alert and oriented to person, place, and time. Coordination normal.  Skin: Skin is warm and dry. No rash noted. He is not diaphoretic.  Psychiatric: He has a normal mood and affect. His behavior is normal.  Nursing note and vitals reviewed.       Assessment & Plan:   Problem List Items Addressed This Visit      Cardiovascular  and Mediastinum   Hypertension associated with diabetes ([place])   Relevant Medications   lisinopril (PRINIVIL,ZESTRIL) 10 MG tablet     Respiratory   COPD (chronic obstructive pulmonary disease) (HCC)     Digestive   GERD (gastroesophageal reflux disease)     Endocrine   Hyperlipidemia associated with type 2 diabetes mellitus ([facility])   Relevant Medications   lisinopril (PRINIVIL,ZESTRIL) 10 MG tablet   Type 2 diabetes mellitus ([place]) - Primary   Relevant Medications   lisinopril (PRINIVIL,ZESTRIL) 10 MG tablet     Will add lisinopril back to his blood pressure regimen and see him back in 3 months.  He will keep a close eye on his blood pressures at home.  Follow up plan: Return in about  3 months (around 04/22/2018), or if symptoms worsen or fail to improve, for Recheck diabetes.  Counseling provided for all of the vaccine components No orders of the defined types were placed in this encounter.   Caryl Pina, MD [facility] 01/22/2018, 9:07 AM

## 2018-01-23 LAB — LIPID PANEL
CHOL/HDL RATIO: 3 ratio (ref 0.0–5.0)
Cholesterol, Total: 125 mg/dL (ref 100–199)
HDL: 41 mg/dL (ref >39)
LDL CALC: 63 mg/dL (ref 0–99)
Triglycerides: 106 mg/dL (ref 0–149)
VLDL Cholesterol Cal: 21 mg/dL (ref 5–40)

## 2018-01-23 LAB — CMP14+EGFR
A/G RATIO: 2 (ref 1.2–2.2)
ALBUMIN: 4.5 g/dL (ref 3.5–4.8)
ALK PHOS: 119 IU/L — AB (ref 39–117)
ALT: 17 IU/L (ref 0–44)
AST: 20 IU/L (ref 0–40)
BUN / CREAT RATIO: 13 (ref 10–24)
BUN: 13 mg/dL (ref 8–27)
Bilirubin Total: 0.9 mg/dL (ref 0.0–1.2)
CALCIUM: 9.4 mg/dL (ref 8.6–10.2)
CO2: 24 mmol/L (ref 20–29)
CREATININE: 0.97 mg/dL (ref 0.76–1.27)
Chloride: 100 mmol/L (ref 96–106)
GFR calc Af Amer: 88 mL/min/{1.73_m2} (ref >59)
GFR, EST NON AFRICAN AMERICAN: 76 mL/min/{1.73_m2} (ref >59)
Globulin, Total: 2.2 g/dL (ref 1.5–4.5)
Glucose: 149 mg/dL — ABNORMAL HIGH (ref 65–99)
POTASSIUM: 4.7 mmol/L (ref 3.5–5.2)
SODIUM: 143 mmol/L (ref 134–144)
Total Protein: 6.7 g/dL (ref 6.0–8.5)

## 2018-02-03 ENCOUNTER — Ambulatory Visit (INDEPENDENT_AMBULATORY_CARE_PROVIDER_SITE_OTHER): Payer: Medicare Other | Admitting: Family Medicine

## 2018-02-03 ENCOUNTER — Telehealth: Payer: Self-pay | Admitting: Family Medicine

## 2018-02-03 ENCOUNTER — Encounter: Payer: Self-pay | Admitting: Family Medicine

## 2018-02-03 VITALS — BP 151/73 | HR 63 | Temp 97.2°F | Ht 66.0 in | Wt 260.0 lb

## 2018-02-03 DIAGNOSIS — J0100 Acute maxillary sinusitis, unspecified: Secondary | ICD-10-CM | POA: Diagnosis not present

## 2018-02-03 MED ORDER — DOXYCYCLINE HYCLATE 100 MG PO TABS: 100.0000 mg | ORAL_TABLET | Freq: Two times a day (BID) | ORAL | 0 refills | Status: DC

## 2018-02-03 NOTE — Progress Notes (Signed)
BP (!) 151/73   Pulse 63   Temp (!) 97.2 F (36.2 C) (Oral)   Ht _0  (1.676 m)   Wt 260 lb (117.9 kg)   BMI 41.97 kg/m    Subjective:    Patient ID: Vincent Collins, male    DOB: Dec 06, 1941, 77 y.o.   MRN: [id number]  HPI: Vincent Collins is a 77 y.o. male presenting on 02/03/2018 for Cough (pt here today c/o coughing, left side of nose bleeding and headaches on the left side which he thinks is a sinus infection)   HPI  Cough congestion headache and nosebleed Patient is here complaining of cough and congestion bleeding from the left side of his nose.  He has a lot of pressure on that side of his face under his eye as well but also radiates into his teeth.  He thinks it is a sinus infection which is similar to previous sinus infections that he had.  He denies any fevers or chills or shortness of breath.  He does have a minimal wheeze but that is about at his baseline.  He denies any sick contacts that he knows of.  He has been trying to use some over-the-counter sinus medication which has been helping some.  This is been going on about 4 days.  Relevant past medical, surgical, family and social history reviewed and updated as indicated. Interim medical history since our last visit reviewed. Allergies and medications reviewed and updated.  Review of Systems  Constitutional: Positive for fever. Negative for chills.  HENT: Positive for congestion, postnasal drip, rhinorrhea, sinus pressure, sneezing and sore throat. Negative for ear discharge, ear pain and voice change.   Eyes: Negative for pain, discharge, redness and visual disturbance.  Respiratory: Positive for cough. Negative for shortness of breath and wheezing.   Cardiovascular: Negative for chest pain and leg swelling.  Musculoskeletal: Negative for gait problem.  Skin: Negative for rash.  All other systems reviewed and are negative.   Per HPI unless specifically indicated above   Allergies as of 02/03/2018      Reactions   Ace Inhibitors Shortness Of Breath, Swelling   Diphenhydramine Hcl Other (See Comments)   Elevates heart rate      Medication List        Accurate as of 02/03/18  6:17 PM. Always use your most recent med list.          ACCU-CHEK AVIVA CONNECT w/Device Kit 1 kit daily by Does not apply route.   ACCU-CHEK SOFTCLIX LANCETS lancets CHECK BLOOD SUGAR ONCE DAILY OR AS DIRECTED   albuterol 108 (90 Base) MCG/ACT inhaler Commonly known as:  PROAIR HFA 2 PUFFS EVERY 6 HOURS AS NEEDED FOR WHEEZING   albuterol (2.5 MG/3ML) 0.083% nebulizer solution Commonly known as:  PROVENTIL 1 VIAL 4 TIMES DAILY AS NEEDED FOR SHORTNESS OF BREATH OR WHEEZING   ALPRAZolam 0.5 MG tablet Commonly known as:  XANAX TAKE  (1)  TABLET TWICE A DAY AS NEEDED.   aspirin 81 MG EC tablet Take 81 mg by mouth daily.   atorvastatin 40 MG tablet Commonly known as:  LIPITOR TAKE 1 TABLET DAILY   doxycycline 100 MG tablet Commonly known as:  VIBRA-TABS Take 1 tablet (100 mg total) by mouth 2 (two) times daily. 1 po bid   fluticasone 50 MCG/ACT nasal spray Commonly known as:  FLONASE Place 1 spray into both nostrils 2 (two) times daily as needed for allergies or rhinitis.   glucose blood  test strip Commonly known as:  ACCU-CHEK AVIVA PLUS [place] to check blood sugars once daily   lisinopril 10 MG tablet Commonly known as:  PRINIVIL,ZESTRIL Take 1 tablet (10 mg total) by mouth daily.   meloxicam 15 MG tablet Commonly known as:  MOBIC Take 1 tablet (15 mg total) by mouth daily.   metFORMIN 500 MG 24 hr tablet Commonly known as:  GLUCOPHAGE-XR TAKE (1) TABLET TWICE A DAY.   metoprolol succinate 50 MG 24 hr tablet Commonly known as:  TOPROL-XL TAKE 1 TABLET ONCE A DAY WITH A MEAL   omeprazole 20 MG capsule Commonly known as:  PRILOSEC Take 1 capsule (20 mg total) by mouth daily.   tamsulosin 0.4 MG Caps capsule Commonly known as:  FLOMAX Take 2 capsules (0.8 mg total) by mouth daily after supper.     Vitamin D 1000 units capsule Take 1,000 Units by mouth 2 (two) times daily.          Objective:    BP (!) 151/73   Pulse 63   Temp (!) 97.2 F (36.2 C) (Oral)   Ht _0  (1.676 m)   Wt 260 lb (117.9 kg)   BMI 41.97 kg/m   Wt Readings from Last 3 Encounters:  02/03/18 260 lb (117.9 kg)  01/22/18 257 lb (116.6 kg)  12/30/17 256 lb (116.1 kg)    Physical Exam  Constitutional: He is oriented to person, place, and time. He appears well-developed and well-nourished. No distress.  HENT:  Right Ear: Tympanic membrane, external ear and ear canal normal.  Left Ear: Tympanic membrane, external ear and ear canal normal.  Nose: Mucosal edema and rhinorrhea present. No sinus tenderness. No epistaxis. Right sinus exhibits maxillary sinus tenderness. Right sinus exhibits no frontal sinus tenderness. Left sinus exhibits maxillary sinus tenderness. Left sinus exhibits no frontal sinus tenderness.  Mouth/Throat: Uvula is midline and mucous membranes are normal. Posterior oropharyngeal edema and posterior oropharyngeal erythema present. No oropharyngeal exudate or tonsillar abscesses.  Eyes: Conjunctivae and EOM are normal. Pupils are equal, round, and reactive to light. No scleral icterus.  Neck: Neck supple. No thyromegaly present.  Cardiovascular: Normal rate, regular rhythm, normal heart sounds and intact distal pulses.  No murmur heard. Pulmonary/Chest: Effort normal and breath sounds normal. No respiratory distress. He has no wheezes. He has no rales.  Musculoskeletal: Normal range of motion. He exhibits no edema.  Lymphadenopathy:    He has no cervical adenopathy.  Neurological: He is alert and oriented to person, place, and time. Coordination normal.  Skin: Skin is warm and dry. No rash noted. He is not diaphoretic.  Psychiatric: He has a normal mood and affect. His behavior is normal.  Nursing note and vitals reviewed.     Assessment & Plan:   Problem List Items Addressed This  Visit    None    Visit Diagnoses    Acute non-recurrent maxillary sinusitis    -  Primary   Relevant Medications   doxycycline (VIBRA-TABS) 100 MG tablet       Follow up plan: Return if symptoms worsen or fail to improve.  Counseling provided for all of the vaccine components No orders of the defined types were placed in this encounter.   Caryl Pina, MD [facility] 02/03/2018, 6:17 PM

## 2018-02-03 NOTE — Telephone Encounter (Signed)
Since patient has not been seen for this problem previously, he will need to be seen.  Scheduled appointment for him tonight at 6 pm with Dr. Louanne Skyeettinger.

## 2018-02-24 ENCOUNTER — Other Ambulatory Visit: Payer: Self-pay | Admitting: Family Medicine

## 2018-02-24 DIAGNOSIS — R351 Nocturia: Principal | ICD-10-CM

## 2018-02-24 DIAGNOSIS — N401 Enlarged prostate with lower urinary tract symptoms: Secondary | ICD-10-CM

## 2018-03-04 ENCOUNTER — Telehealth: Payer: Self-pay | Admitting: Family Medicine

## 2018-03-04 DIAGNOSIS — K2950 Unspecified chronic gastritis without bleeding: Secondary | ICD-10-CM

## 2018-03-04 DIAGNOSIS — I10 Essential (primary) hypertension: Secondary | ICD-10-CM

## 2018-03-04 MED ORDER — METOPROLOL SUCCINATE ER 50 MG PO TB24
ORAL_TABLET | ORAL | 0 refills | Status: DC
Start: 2018-03-04 — End: 2018-04-22

## 2018-03-04 MED ORDER — OMEPRAZOLE 20 MG PO CPDR: 20.0000 mg | DELAYED_RELEASE_CAPSULE | Freq: Every day | ORAL | 0 refills | Status: DC

## 2018-03-04 MED ORDER — ATORVASTATIN CALCIUM 40 MG PO TABS: 40.0000 mg | ORAL_TABLET | Freq: Every day | ORAL | 0 refills | Status: DC

## 2018-03-04 MED ORDER — METFORMIN HCL ER 500 MG PO TB24: ORAL_TABLET | ORAL | 0 refills | Status: DC

## 2018-03-04 MED ORDER — METOPROLOL SUCCINATE ER 50 MG PO TB24: ORAL_TABLET | ORAL | 0 refills | Status: DC

## 2018-03-04 NOTE — Telephone Encounter (Signed)
RXs sent in per pt request Okayed per Dr Dettinger

## 2018-03-29 ENCOUNTER — Other Ambulatory Visit: Payer: Self-pay | Admitting: Family Medicine

## 2018-03-29 DIAGNOSIS — J019 Acute sinusitis, unspecified: Secondary | ICD-10-CM

## 2018-04-22 ENCOUNTER — Other Ambulatory Visit: Payer: Self-pay | Admitting: Family Medicine

## 2018-04-22 DIAGNOSIS — K2950 Unspecified chronic gastritis without bleeding: Secondary | ICD-10-CM

## 2018-04-22 DIAGNOSIS — I10 Essential (primary) hypertension: Secondary | ICD-10-CM

## 2018-04-29 ENCOUNTER — Ambulatory Visit (INDEPENDENT_AMBULATORY_CARE_PROVIDER_SITE_OTHER): Payer: Medicare Other | Admitting: Family Medicine

## 2018-04-29 ENCOUNTER — Encounter: Payer: Self-pay | Admitting: Family Medicine

## 2018-04-29 VITALS — BP 148/76 | HR 66 | Temp 96.8°F | Ht 66.0 in | Wt 259.2 lb

## 2018-04-29 DIAGNOSIS — E1169 Type 2 diabetes mellitus with other specified complication: Secondary | ICD-10-CM | POA: Diagnosis not present

## 2018-04-29 DIAGNOSIS — E1165 Type 2 diabetes mellitus with hyperglycemia: Secondary | ICD-10-CM

## 2018-04-29 DIAGNOSIS — K219 Gastro-esophageal reflux disease without esophagitis: Secondary | ICD-10-CM | POA: Diagnosis not present

## 2018-04-29 DIAGNOSIS — E1159 Type 2 diabetes mellitus with other circulatory complications: Secondary | ICD-10-CM | POA: Diagnosis not present

## 2018-04-29 DIAGNOSIS — E785 Hyperlipidemia, unspecified: Secondary | ICD-10-CM | POA: Diagnosis not present

## 2018-04-29 DIAGNOSIS — J449 Chronic obstructive pulmonary disease, unspecified: Secondary | ICD-10-CM | POA: Diagnosis not present

## 2018-04-29 DIAGNOSIS — I10 Essential (primary) hypertension: Secondary | ICD-10-CM

## 2018-04-29 LAB — BAYER DCA HB A1C WAIVED: HB A1C (BAYER DCA - WAIVED): 7.1 % — ABNORMAL HIGH (ref <7.0)

## 2018-04-29 NOTE — Patient Instructions (Signed)
Get your diabetic eye exam every year

## 2018-04-29 NOTE — Progress Notes (Signed)
BP (!) 148/76   Pulse 66   Temp (!) 96.8 F (36 C) (Oral)   Ht '5\' 6"'$  (1.676 m)   Wt 259 lb 3.2 oz (117.6 kg)   BMI 41.84 kg/m    Subjective:    Patient ID: Vincent Collins, male    DOB: [birth year], 77 y.o.   MRN: [id number]  HPI: Vincent Collins is a 77 y.o. male presenting on 04/29/2018 for Follow-up (3 month ); Diabetes; Hyperlipidemia; and Hypertension   HPI Type 2 diabetes mellitus Patient comes in today for recheck of his diabetes. Patient has been currently taking metformin. Patient is currently on an ACE inhibitor/ARB. Patient has not seen an ophthalmologist this year. Patient denies any issues with their feet.   Hyperlipidemia Patient is coming in for recheck of his hyperlipidemia. The patient is currently taking Lipitor. They deny any issues with myalgias or history of liver damage from it. They deny any focal numbness or weakness or chest pain.   Hypertension Patient is currently on lisinopril 10 and metoprolol, and their blood pressure today is 148/76, we have been permitting him to be slightly more hypertensive because he has been having orthostatic hypotension still at times.  We will not increase his dose because of this. Patient has lightheadedness and dizziness that sometimes his vertigo but sometimes is orthostatic when he stands up too quickly. Patient denies headaches, blurred vision, chest pains, shortness of breath, or weakness. Denies any side effects from medication and is content with current medication.   COPD Patient is coming in for COPD recheck today.  He is currently on albuterol as needed and Flonase for allergies.  He has a mild chronic cough but denies any major coughing spells or wheezing spells.  He has 0nighttime symptoms per week and 1daytime symptoms per week currently.   GERD Patient is currently on omeprazole.  She denies any major symptoms or abdominal pain or belching or burping. She denies any blood in her stool or lightheadedness or  dizziness.  Relevant past medical, surgical, family and social history reviewed and updated as indicated. Interim medical history since our last visit reviewed. Allergies and medications reviewed and updated.  Review of Systems  Constitutional: Negative for chills and fever.  Respiratory: Positive for wheezing (Has an occasional wheeze but not very frequently). Negative for shortness of breath.   Cardiovascular: Negative for chest pain and leg swelling.  Gastrointestinal: Negative for abdominal pain.  Musculoskeletal: Negative for back pain and gait problem.  Skin: Negative for rash.  Neurological: Negative for dizziness, weakness, light-headedness and numbness.  All other systems reviewed and are negative.   Per HPI unless specifically indicated above   Allergies as of 04/29/2018      Reactions   Ace Inhibitors Shortness Of Breath, Swelling   Diphenhydramine Hcl Other (See Comments)   Elevates heart rate      Medication List        Accurate as of 04/29/18  8:33 AM. Always use your most recent med list.          ACCU-CHEK AVIVA CONNECT w/Device Kit 1 kit daily by Does not apply route.   ACCU-CHEK SOFTCLIX LANCETS lancets CHECK BLOOD SUGAR ONCE DAILY OR AS DIRECTED   albuterol 108 (90 Base) MCG/ACT inhaler Commonly known as:  PROAIR HFA 2 PUFFS EVERY 6 HOURS AS NEEDED FOR WHEEZING   albuterol (2.5 MG/3ML) 0.083% nebulizer solution Commonly known as:  PROVENTIL 1 VIAL 4 TIMES DAILY AS NEEDED FOR SHORTNESS OF BREATH  OR WHEEZING   ALPRAZolam 0.5 MG tablet Commonly known as:  XANAX TAKE  (1)  TABLET TWICE A DAY AS NEEDED.   aspirin 81 MG EC tablet Take 81 mg by mouth daily.   atorvastatin 40 MG tablet Commonly known as:  LIPITOR TAKE 1 TABLET BY MOUTH  DAILY   fluticasone 50 MCG/ACT nasal spray Commonly known as:  FLONASE PLACE 1 SPRAY INTO BOTH  NOSTRILS 2 TIMES DAILY AS  NEEDED FOR ALLERGIES OR  RHINITIS.   glucose blood test strip Commonly known as:   ACCU-CHEK AVIVA PLUS [place] to check blood sugars once daily   lisinopril 10 MG tablet Commonly known as:  PRINIVIL,ZESTRIL Take 1 tablet (10 mg total) by mouth daily.   meloxicam 15 MG tablet Commonly known as:  MOBIC Take 1 tablet (15 mg total) by mouth daily.   metFORMIN 500 MG 24 hr tablet Commonly known as:  GLUCOPHAGE-XR TAKE 1 TABLET BY MOUTH  TWICE A DAY   metoprolol succinate 50 MG 24 hr tablet Commonly known as:  TOPROL-XL TAKE 1 TABLET BY MOUTH ONCE DAILY WITH A MEAL   omeprazole 20 MG capsule Commonly known as:  PRILOSEC TAKE 1 CAPSULE BY MOUTH  DAILY   tamsulosin 0.4 MG Caps capsule Commonly known as:  FLOMAX TAKE 2 CAPSULES BY MOUTH  DAILY AFTER SUPPER   Vitamin D 1000 units capsule Take 1,000 Units by mouth 2 (two) times daily.          Objective:    BP (!) 148/76   Pulse 66   Temp (!) 96.8 F (36 C) (Oral)   Ht '5\' 6"'$  (1.676 m)   Wt 259 lb 3.2 oz (117.6 kg)   BMI 41.84 kg/m   Wt Readings from Last 3 Encounters:  04/29/18 259 lb 3.2 oz (117.6 kg)  02/03/18 260 lb (117.9 kg)  01/22/18 257 lb (116.6 kg)    Physical Exam  Constitutional: He is oriented to person, place, and time. He appears well-developed and well-nourished. No distress.  Eyes: Pupils are equal, round, and reactive to light. Conjunctivae and EOM are normal. Right eye exhibits no discharge. No scleral icterus.  Neck: Neck supple. No thyromegaly present.  Cardiovascular: Normal rate, regular rhythm, normal heart sounds and intact distal pulses.  No murmur heard. Pulmonary/Chest: Effort normal. No respiratory distress. He has wheezes (End expiratory upper wheezes some of the time, not on every breath).  Musculoskeletal: Normal range of motion. He exhibits no edema.  Lymphadenopathy:    He has no cervical adenopathy.  Neurological: He is alert and oriented to person, place, and time. Coordination normal.  Skin: Skin is warm and dry. No rash noted. He is not diaphoretic.  Psychiatric: He  has a normal mood and affect. His behavior is normal.  Nursing note and vitals reviewed.   Diabetic Foot Exam - Simple   Simple Foot Form Diabetic Foot exam was performed with the following findings:  Yes 04/29/2018  8:32 AM  Visual Inspection No deformities, no ulcerations, no other skin breakdown bilaterally:  Yes Sensation Testing Intact to touch and monofilament testing bilaterally:  Yes Pulse Check Posterior Tibialis and Dorsalis pulse intact bilaterally:  Yes Comments Patient does have onychomycosis in left great toe but otherwise normal        Assessment & Plan:   Problem List Items Addressed This Visit      Cardiovascular and Mediastinum   Hypertension associated with diabetes ([place])     Respiratory   COPD (chronic obstructive  pulmonary disease) ([place])     Digestive   GERD (gastroesophageal reflux disease)     Endocrine   Hyperlipidemia associated with type 2 diabetes mellitus ([place])   Type 2 diabetes mellitus ([place]) - Primary   Relevant Orders   Bayer DCA Hb A1c Waived     Continue current medications for diabetes, no increases for now, will check A1c, recommended diabetic eye exam  Permissive hypertension because has been orthostatic in the past, will not increase lisinopril at this time  Follow up plan: Return in about 3 months (around 07/30/2018), or if symptoms worsen or fail to improve, for Diabetes and cholesterol.  Counseling provided for all of the vaccine components Orders Placed This Encounter  Procedures  . Bayer [facility] Hb A1c [place], MD [facility] 04/29/2018, 8:33 AM

## 2018-05-22 ENCOUNTER — Other Ambulatory Visit: Payer: Self-pay | Admitting: Family Medicine

## 2018-07-10 ENCOUNTER — Other Ambulatory Visit: Payer: Self-pay | Admitting: Family Medicine

## 2018-07-10 DIAGNOSIS — I10 Essential (primary) hypertension: Secondary | ICD-10-CM

## 2018-07-10 DIAGNOSIS — K2950 Unspecified chronic gastritis without bleeding: Secondary | ICD-10-CM

## 2018-08-18 ENCOUNTER — Ambulatory Visit (INDEPENDENT_AMBULATORY_CARE_PROVIDER_SITE_OTHER): Payer: Medicare Other | Admitting: Family Medicine

## 2018-08-18 ENCOUNTER — Encounter: Payer: Self-pay | Admitting: Family Medicine

## 2018-08-18 VITALS — BP 160/83 | HR 58 | Temp 96.9°F | Ht 66.0 in | Wt 259.0 lb

## 2018-08-18 DIAGNOSIS — N401 Enlarged prostate with lower urinary tract symptoms: Secondary | ICD-10-CM

## 2018-08-18 DIAGNOSIS — K219 Gastro-esophageal reflux disease without esophagitis: Secondary | ICD-10-CM | POA: Diagnosis not present

## 2018-08-18 DIAGNOSIS — E1169 Type 2 diabetes mellitus with other specified complication: Secondary | ICD-10-CM

## 2018-08-18 DIAGNOSIS — J449 Chronic obstructive pulmonary disease, unspecified: Secondary | ICD-10-CM | POA: Diagnosis not present

## 2018-08-18 DIAGNOSIS — I10 Essential (primary) hypertension: Secondary | ICD-10-CM

## 2018-08-18 DIAGNOSIS — E1159 Type 2 diabetes mellitus with other circulatory complications: Secondary | ICD-10-CM

## 2018-08-18 DIAGNOSIS — I152 Hypertension secondary to endocrine disorders: Secondary | ICD-10-CM

## 2018-08-18 DIAGNOSIS — R351 Nocturia: Secondary | ICD-10-CM

## 2018-08-18 DIAGNOSIS — E785 Hyperlipidemia, unspecified: Secondary | ICD-10-CM | POA: Diagnosis not present

## 2018-08-18 LAB — BAYER DCA HB A1C WAIVED: HB A1C: 7.5 % — AB (ref <7.0)

## 2018-08-18 MED ORDER — LISINOPRIL 20 MG PO TABS: 20.0000 mg | ORAL_TABLET | Freq: Every day | ORAL | 3 refills | Status: DC

## 2018-08-18 NOTE — Addendum Note (Signed)
Addended by: Almeta MonasSTONE, JANIE M on: 08/18/2018 11:32 AM   Modules accepted: Orders

## 2018-08-18 NOTE — Progress Notes (Signed)
BP (!) 168/81   Pulse 60   Temp (!) 96.9 F (36.1 C) (Oral)   Ht _0  (1.676 m)   Wt 259 lb (117.5 kg)   BMI 41.80 kg/m    Subjective:    Patient ID: Vincent Collins, male    DOB: 20-Mar-1941, 77 y.o.   MRN: [id number]  HPI: Vincent Collins is a 77 y.o. male presenting on 08/18/2018 for Medical Management of Chronic Issues (diabetic three month recheck)   HPI Type 2 diabetes mellitus Patient comes in today for recheck of his diabetes. Patient has been currently taking metformin. Patient is currently on an ACE inhibitor/ARB. Patient has not seen an ophthalmologist this year. Patient denies any issues with their feet.  He says his blood sugars are running well but he does not check it too frequently.  Hypertension Patient is currently on lisinopril and metoprolol, and their blood pressure today is 168/81. Patient denies any lightheadedness or dizziness. Patient denies headaches, blurred vision, chest pains, shortness of breath, or weakness. Denies any side effects from medication and is content with current medication.   Hyperlipidemia Patient is coming in for recheck of his hyperlipidemia. The patient is currently taking atorvastatin. They deny any issues with myalgias or history of liver damage from it. They deny any focal numbness or weakness or chest pain.   GERD Patient is currently on omeprazole.  She denies any major symptoms or abdominal pain or belching or burping. She denies any blood in her stool or lightheadedness or dizziness.   BPH Patient says is been doing very well on the tamsulosin and has been helping with his nocturia and he denies any major issues with it currently.  Relevant past medical, surgical, family and social history reviewed and updated as indicated. Interim medical history since our last visit reviewed. Allergies and medications reviewed and updated.  Review of Systems  Constitutional: Negative for chills and fever.  Eyes: Negative for visual  disturbance.  Respiratory: Negative for cough, chest tightness, shortness of breath and wheezing.   Cardiovascular: Negative for chest pain and leg swelling.  Gastrointestinal: Negative for abdominal pain.  Musculoskeletal: Negative for back pain and gait problem.  Skin: Negative for rash.  Neurological: Negative for dizziness, weakness, light-headedness and headaches.  All other systems reviewed and are negative.   Per HPI unless specifically indicated above   Allergies as of 08/18/2018      Reactions   Ace Inhibitors Shortness Of Breath, Swelling   Diphenhydramine Hcl Other (See Comments)   Elevates heart rate      Medication List        Accurate as of 08/18/18 11:07 AM. Always use your most recent med list.          ACCU-CHEK AVIVA CONNECT w/Device Kit 1 kit daily by Does not apply route.   ACCU-CHEK SOFTCLIX LANCETS lancets CHECK BLOOD SUGAR ONCE DAILY OR AS DIRECTED   albuterol (2.5 MG/3ML) 0.083% nebulizer solution Commonly known as:  PROVENTIL 1 VIAL 4 TIMES DAILY AS NEEDED FOR SHORTNESS OF BREATH OR WHEEZING   albuterol 108 (90 Base) MCG/ACT inhaler Commonly known as:  PROVENTIL HFA;VENTOLIN HFA USE 2 PUFFS EVERY 6 HOURS  AS NEEDED FOR WHEEZING   aspirin 81 MG EC tablet Take 81 mg by mouth daily.   atorvastatin 40 MG tablet Commonly known as:  LIPITOR TAKE 1 TABLET BY MOUTH  DAILY   fluticasone 50 MCG/ACT nasal spray Commonly known as:  FLONASE PLACE 1 SPRAY INTO BOTH  NOSTRILS 2 TIMES DAILY AS  NEEDED FOR ALLERGIES OR  RHINITIS.   glucose blood test strip [place] to check blood sugars once daily   lisinopril 10 MG tablet Commonly known as:  PRINIVIL,ZESTRIL Take 1 tablet (10 mg total) by mouth daily.   metFORMIN 500 MG 24 hr tablet Commonly known as:  GLUCOPHAGE-XR TAKE 1 TABLET BY MOUTH  TWICE A DAY   metoprolol succinate 50 MG 24 hr tablet Commonly known as:  TOPROL-XL TAKE 1 TABLET BY MOUTH ONCE DAILY WITH A MEAL   omeprazole 20 MG  capsule Commonly known as:  PRILOSEC TAKE 1 CAPSULE BY MOUTH  DAILY   tamsulosin 0.4 MG Caps capsule Commonly known as:  FLOMAX TAKE 2 CAPSULES BY MOUTH  DAILY AFTER SUPPER   Vitamin D 1000 units capsule Take 1,000 Units by mouth 2 (two) times daily.          Objective:    BP (!) 168/81   Pulse 60   Temp (!) 96.9 F (36.1 C) (Oral)   Ht _0  (1.676 m)   Wt 259 lb (117.5 kg)   BMI 41.80 kg/m   Wt Readings from Last 3 Encounters:  08/18/18 259 lb (117.5 kg)  04/29/18 259 lb 3.2 oz (117.6 kg)  02/03/18 260 lb (117.9 kg)    Physical Exam  Constitutional: He is oriented to person, place, and time. He appears well-developed and well-nourished. No distress.  Eyes: Conjunctivae are normal. No scleral icterus.  Neck: Neck supple. No thyromegaly present.  Cardiovascular: Normal rate, regular rhythm, normal heart sounds and intact distal pulses.  No murmur heard. Pulmonary/Chest: Effort normal and breath sounds normal. No respiratory distress. He has no wheezes. He has no rales.  Musculoskeletal: Normal range of motion. He exhibits no edema.  Lymphadenopathy:    He has no cervical adenopathy.  Neurological: He is alert and oriented to person, place, and time. Coordination normal.  Skin: Skin is warm and dry. No rash noted. He is not diaphoretic.  Psychiatric: He has a normal mood and affect. His behavior is normal.  Nursing note and vitals reviewed.   Results for orders placed or performed in visit on 04/29/18  Bayer DCA Hb A1c Waived  Result Value Ref Range   HB A1C (BAYER DCA - WAIVED) 7.1 (H) <7.0 %      Assessment & Plan:   Problem List Items Addressed This Visit      Cardiovascular and Mediastinum   Hypertension associated with diabetes ([place])   Relevant Medications   lisinopril (PRINIVIL,ZESTRIL) 20 MG tablet     Respiratory   COPD (chronic obstructive pulmonary disease) ([facility])     Digestive   GERD (gastroesophageal reflux disease)     Endocrine    Hyperlipidemia associated with type 2 diabetes mellitus ([facility])   Relevant Medications   lisinopril (PRINIVIL,ZESTRIL) 20 MG tablet   Type 2 diabetes mellitus ([place]) - Primary   Relevant Medications   lisinopril (PRINIVIL,ZESTRIL) 20 MG tablet     Genitourinary   BPH (benign prostatic hyperplasia)     Increased patient's lisinopril up to 20 mg because of elevated blood pressure, Continue other medications currently including atorvastatin and metoprolol and metformin and omeprazole and tamsulosin  Follow up plan: Return in about 3 months (around 11/18/2018), or if symptoms worsen or fail to improve, for Diabetes recheck.  Counseling provided for all of the vaccine components No orders of the defined types were placed in this encounter.   Caryl Pina, MD [facility] 08/18/2018, 11:07 AM

## 2018-08-19 LAB — CMP14+EGFR
ALBUMIN: 4 g/dL (ref 3.5–4.8)
ALK PHOS: 119 IU/L — AB (ref 39–117)
ALT: 16 IU/L (ref 0–44)
AST: 17 IU/L (ref 0–40)
Albumin/Globulin Ratio: 1.5 (ref 1.2–2.2)
BILIRUBIN TOTAL: 0.8 mg/dL (ref 0.0–1.2)
BUN / CREAT RATIO: 17 (ref 10–24)
BUN: 17 mg/dL (ref 8–27)
CHLORIDE: 104 mmol/L (ref 96–106)
CO2: 26 mmol/L (ref 20–29)
Calcium: 9 mg/dL (ref 8.6–10.2)
Creatinine, Ser: 0.99 mg/dL (ref 0.76–1.27)
GFR calc Af Amer: 85 mL/min/{1.73_m2} (ref >59)
GFR calc non Af Amer: 74 mL/min/{1.73_m2} (ref >59)
GLOBULIN, TOTAL: 2.6 g/dL (ref 1.5–4.5)
Glucose: 149 mg/dL — ABNORMAL HIGH (ref 65–99)
POTASSIUM: 4.3 mmol/L (ref 3.5–5.2)
Sodium: 142 mmol/L (ref 134–144)
Total Protein: 6.6 g/dL (ref 6.0–8.5)

## 2018-08-19 LAB — LIPID PANEL
CHOLESTEROL TOTAL: 117 mg/dL (ref 100–199)
Chol/HDL Ratio: 3.2 ratio (ref 0.0–5.0)
HDL: 37 mg/dL — ABNORMAL LOW (ref >39)
LDL Calculated: 59 mg/dL (ref 0–99)
TRIGLYCERIDES: 104 mg/dL (ref 0–149)
VLDL Cholesterol Cal: 21 mg/dL (ref 5–40)

## 2018-10-25 ENCOUNTER — Ambulatory Visit: Payer: Medicare Other

## 2018-11-02 ENCOUNTER — Ambulatory Visit: Payer: Medicare Other

## 2018-11-16 ENCOUNTER — Other Ambulatory Visit: Payer: Self-pay | Admitting: Family Medicine

## 2018-11-16 DIAGNOSIS — I10 Essential (primary) hypertension: Secondary | ICD-10-CM

## 2018-11-16 DIAGNOSIS — N401 Enlarged prostate with lower urinary tract symptoms: Secondary | ICD-10-CM

## 2018-11-16 DIAGNOSIS — K2950 Unspecified chronic gastritis without bleeding: Secondary | ICD-10-CM

## 2018-11-16 DIAGNOSIS — R351 Nocturia: Principal | ICD-10-CM

## 2018-11-17 NOTE — Telephone Encounter (Signed)
Message left for patient to schedule an appointment for check up and lab work for further refills.

## 2018-11-17 NOTE — Telephone Encounter (Signed)
Dettinger. Refills sent to pharmacy [facility] for his 3 mos follow-up

## 2018-12-16 ENCOUNTER — Ambulatory Visit: Payer: Medicare Other | Admitting: Family Medicine

## 2019-01-04 ENCOUNTER — Ambulatory Visit (INDEPENDENT_AMBULATORY_CARE_PROVIDER_SITE_OTHER): Payer: Medicare Other | Admitting: Family Medicine

## 2019-01-04 ENCOUNTER — Encounter: Payer: Self-pay | Admitting: Family Medicine

## 2019-01-04 VITALS — BP 166/79 | HR 65 | Temp 95.8°F | Ht 66.0 in | Wt 255.2 lb

## 2019-01-04 DIAGNOSIS — K2950 Unspecified chronic gastritis without bleeding: Secondary | ICD-10-CM | POA: Diagnosis not present

## 2019-01-04 DIAGNOSIS — E1169 Type 2 diabetes mellitus with other specified complication: Secondary | ICD-10-CM

## 2019-01-04 DIAGNOSIS — N401 Enlarged prostate with lower urinary tract symptoms: Secondary | ICD-10-CM

## 2019-01-04 DIAGNOSIS — J449 Chronic obstructive pulmonary disease, unspecified: Secondary | ICD-10-CM

## 2019-01-04 DIAGNOSIS — Z23 Encounter for immunization: Secondary | ICD-10-CM | POA: Diagnosis not present

## 2019-01-04 DIAGNOSIS — R351 Nocturia: Secondary | ICD-10-CM

## 2019-01-04 DIAGNOSIS — E1159 Type 2 diabetes mellitus with other circulatory complications: Secondary | ICD-10-CM

## 2019-01-04 DIAGNOSIS — I10 Essential (primary) hypertension: Secondary | ICD-10-CM | POA: Diagnosis not present

## 2019-01-04 DIAGNOSIS — K219 Gastro-esophageal reflux disease without esophagitis: Secondary | ICD-10-CM

## 2019-01-04 DIAGNOSIS — E785 Hyperlipidemia, unspecified: Secondary | ICD-10-CM

## 2019-01-04 LAB — BAYER DCA HB A1C WAIVED: HB A1C (BAYER DCA - WAIVED): 6.9 % (ref <7.0)

## 2019-01-04 MED ORDER — LISINOPRIL 20 MG PO TABS: 20.0000 mg | ORAL_TABLET | Freq: Every day | ORAL | 3 refills | Status: DC

## 2019-01-04 MED ORDER — METFORMIN HCL ER 500 MG PO TB24: 500.0000 mg | ORAL_TABLET | Freq: Two times a day (BID) | ORAL | 3 refills | Status: DC

## 2019-01-04 MED ORDER — OMEPRAZOLE 20 MG PO CPDR: 20.0000 mg | DELAYED_RELEASE_CAPSULE | Freq: Every day | ORAL | 3 refills | Status: DC

## 2019-01-04 MED ORDER — TAMSULOSIN HCL 0.4 MG PO CAPS: ORAL_CAPSULE | ORAL | 3 refills | Status: DC

## 2019-01-04 MED ORDER — ATORVASTATIN CALCIUM 40 MG PO TABS: 40.0000 mg | ORAL_TABLET | Freq: Every day | ORAL | 3 refills | Status: DC

## 2019-01-04 MED ORDER — METOPROLOL SUCCINATE ER 50 MG PO TB24: ORAL_TABLET | ORAL | 3 refills | Status: DC

## 2019-01-04 NOTE — Progress Notes (Signed)
BP (!) 166/79   Pulse 65   Temp (!) 95.8 F (35.4 C) (Oral)   Ht 5' 6" (1.676 m)   Wt 255 lb 3.2 oz (115.8 kg)   BMI 41.19 kg/m    Subjective:    Patient ID: Vincent Collins, male    DOB: [birth year], 78 y.o.   MRN: [id number]  HPI: Vincent Collins is a 78 y.o. male presenting on 01/04/2019 for Diabetes (5 month follow up) and Hypertension   HPI Hypertension Patient is currently on metoprolol and lisinopril, and their blood pressure today is 166/79. Patient denies any lightheadedness or dizziness. Patient denies headaches, blurred vision, chest pains, shortness of breath, or weakness. Denies any side effects from medication and is content with current medication.   Type 2 diabetes mellitus Patient comes in today for recheck of his diabetes. Patient has been currently taking metformin. Patient is currently on an ACE inhibitor/ARB. Patient has not seen an ophthalmologist this year. Patient is with his feet.  He does have some neuropathy but is controlled currently.   COPD Patient is coming in for COPD recheck today.  He is currently on albuterol.  He has a mild chronic cough but denies any major coughing spells or wheezing spells.  He has 0nighttime symptoms per week and 0daytime symptoms per week currently.   GERD Patient is currently on omeprazole.  She denies any major symptoms or abdominal pain or belching or burping. She denies any blood in her stool or lightheadedness or dizziness.   Hyperlipidemia Patient is coming in for recheck of his hyperlipidemia. The patient is currently taking Lipitor. They deny any issues with myalgias or history of liver damage from it. They deny any focal numbness or weakness or chest pain.   Relevant past medical, surgical, family and social history reviewed and updated as indicated. Interim medical history since our last visit reviewed. Allergies and medications reviewed and updated.  Review of Systems  Constitutional: Negative for chills and fever.    Eyes: Negative for visual disturbance.  Respiratory: Negative for shortness of breath and wheezing.   Cardiovascular: Negative for chest pain and leg swelling.  Gastrointestinal: Negative for abdominal pain, constipation, diarrhea, nausea and vomiting.  Musculoskeletal: Negative for back pain and gait problem.  Skin: Negative for rash.  Neurological: Positive for numbness. Negative for dizziness, weakness and light-headedness.  All other systems reviewed and are negative.   Per HPI unless specifically indicated above   Allergies as of 01/04/2019      Reactions   Diphenhydramine Hcl Other (See Comments)   Elevates heart rate      Medication List       Accurate as of January 04, 2019  8:48 AM. Always use your most recent med list.        ACCU-CHEK AVIVA CONNECT w/Device Kit 1 kit daily by Does not apply route.   ACCU-CHEK SOFTCLIX LANCETS lancets CHECK BLOOD SUGAR ONCE DAILY OR AS DIRECTED   albuterol (2.5 MG/3ML) 0.083% nebulizer solution Commonly known as:  PROVENTIL 1 VIAL 4 TIMES DAILY AS NEEDED FOR SHORTNESS OF BREATH OR WHEEZING   albuterol 108 (90 Base) MCG/ACT inhaler Commonly known as:  PROAIR HFA USE 2 PUFFS EVERY 6 HOURS  AS NEEDED FOR WHEEZING   aspirin 81 MG EC tablet Take 81 mg by mouth daily.   atorvastatin 40 MG tablet Commonly known as:  LIPITOR Take 1 tablet (40 mg total) by mouth daily.   fluticasone 50 MCG/ACT nasal spray Commonly known  as:  FLONASE PLACE 1 SPRAY INTO BOTH  NOSTRILS 2 TIMES DAILY AS  NEEDED FOR ALLERGIES OR  RHINITIS.   glucose blood test strip Commonly known as:  ACCU-CHEK AVIVA PLUS [place] to check blood sugars once daily   lisinopril 20 MG tablet Commonly known as:  PRINIVIL,ZESTRIL Take 1 tablet (20 mg total) by mouth daily.   metFORMIN 500 MG 24 hr tablet Commonly known as:  GLUCOPHAGE-XR Take 1 tablet (500 mg total) by mouth 2 (two) times daily.   metoprolol succinate 50 MG 24 hr tablet Commonly known as:   TOPROL-XL Take with or immediately following a meal.   omeprazole 20 MG capsule Commonly known as:  PRILOSEC Take 1 capsule (20 mg total) by mouth daily.   tamsulosin 0.4 MG Caps capsule Commonly known as:  FLOMAX TAKE 2 CAPSULES BY MOUTH  DAILY AFTER SUPPER   Vitamin D 1000 units capsule Take 1,000 Units by mouth 2 (two) times daily.          Objective:    BP (!) 166/79   Pulse 65   Temp (!) 95.8 F (35.4 C) (Oral)   Ht 5' 6" (1.676 m)   Wt 255 lb 3.2 oz (115.8 kg)   BMI 41.19 kg/m   Wt Readings from Last 3 Encounters:  01/04/19 255 lb 3.2 oz (115.8 kg)  08/18/18 259 lb (117.5 kg)  04/29/18 259 lb 3.2 oz (117.6 kg)    Physical Exam Vitals signs and nursing note reviewed.  Constitutional:      General: He is not in acute distress.    Appearance: He is well-developed. He is not diaphoretic.  Eyes:     General: No scleral icterus.    Conjunctiva/sclera: Conjunctivae normal.  Neck:     Musculoskeletal: Neck supple.     Thyroid: No thyromegaly.  Cardiovascular:     Rate and Rhythm: Normal rate and regular rhythm.     Heart sounds: Normal heart sounds. No murmur.  Pulmonary:     Effort: Pulmonary effort is normal. No respiratory distress.     Breath sounds: Normal breath sounds. No wheezing.  Musculoskeletal: Normal range of motion.  Lymphadenopathy:     Cervical: No cervical adenopathy.  Skin:    General: Skin is warm and dry.     Findings: No rash.  Neurological:     Mental Status: He is alert and oriented to person, place, and time.     Coordination: Coordination normal.  Psychiatric:        Behavior: Behavior normal.         Assessment & Plan:   Problem List Items Addressed This Visit      Cardiovascular and Mediastinum   Hypertension associated with diabetes ([place])   Relevant Medications   metoprolol succinate (TOPROL-XL) 50 MG 24 hr tablet   lisinopril (PRINIVIL,ZESTRIL) 20 MG tablet   atorvastatin (LIPITOR) 40 MG tablet   metFORMIN  (GLUCOPHAGE-XR) 500 MG 24 hr tablet   Other Relevant Orders   CMP14+EGFR     Respiratory   COPD (chronic obstructive pulmonary disease) (HCC)     Digestive   GERD (gastroesophageal reflux disease)   Relevant Medications   omeprazole (PRILOSEC) 20 MG capsule     Endocrine   Hyperlipidemia associated with type 2 diabetes mellitus (HCC)   Relevant Medications   lisinopril (PRINIVIL,ZESTRIL) 20 MG tablet   atorvastatin (LIPITOR) 40 MG tablet   metFORMIN (GLUCOPHAGE-XR) 500 MG 24 hr tablet   Type 2 diabetes mellitus ([place])  Relevant Medications   lisinopril (PRINIVIL,ZESTRIL) 20 MG tablet   atorvastatin (LIPITOR) 40 MG tablet   metFORMIN (GLUCOPHAGE-XR) 500 MG 24 hr tablet   Other Relevant Orders   Bayer DCA Hb A1c Waived   CMP14+EGFR     Genitourinary   BPH (benign prostatic hyperplasia)   Relevant Medications   tamsulosin (FLOMAX) 0.4 MG CAPS capsule    Other Visit Diagnoses    Essential hypertension    -  Primary   Relevant Medications   metoprolol succinate (TOPROL-XL) 50 MG 24 hr tablet   lisinopril (PRINIVIL,ZESTRIL) 20 MG tablet   atorvastatin (LIPITOR) 40 MG tablet   Other Relevant Orders   CMP14+EGFR   Chronic gastritis without bleeding, unspecified gastritis type       Relevant Medications   omeprazole (PRILOSEC) 20 MG capsule      Continue current medication Follow up plan: Return in about 3 months (around 04/05/2019), or if symptoms worsen or fail to improve, for Type 2 diabetes and hypertension.  Counseling provided for all of the vaccine components Orders Placed This Encounter  Procedures  . Bayer DCA Hb A1c Waived  . [place] Dettinger, MD [facility] 01/04/2019, 8:48 AM

## 2019-01-05 LAB — CMP14+EGFR
A/G RATIO: 2 (ref 1.2–2.2)
ALT: 15 IU/L (ref 0–44)
AST: 17 IU/L (ref 0–40)
Albumin: 4.1 g/dL (ref 3.5–4.8)
Alkaline Phosphatase: 108 IU/L (ref 39–117)
BUN/Creatinine Ratio: 18 (ref 10–24)
BUN: 19 mg/dL (ref 8–27)
Bilirubin Total: 1 mg/dL (ref 0.0–1.2)
CO2: 23 mmol/L (ref 20–29)
Calcium: 7.9 mg/dL — ABNORMAL LOW (ref 8.6–10.2)
Chloride: 100 mmol/L (ref 96–106)
Creatinine, Ser: 1.04 mg/dL (ref 0.76–1.27)
GFR calc Af Amer: 80 mL/min/{1.73_m2} (ref >59)
GFR calc non Af Amer: 69 mL/min/{1.73_m2} (ref >59)
GLOBULIN, TOTAL: 2.1 g/dL (ref 1.5–4.5)
Glucose: 144 mg/dL — ABNORMAL HIGH (ref 65–99)
Potassium: 4.3 mmol/L (ref 3.5–5.2)
Sodium: 141 mmol/L (ref 134–144)
Total Protein: 6.2 g/dL (ref 6.0–8.5)

## 2019-02-21 ENCOUNTER — Telehealth: Payer: Self-pay | Admitting: Family Medicine

## 2019-03-03 ENCOUNTER — Other Ambulatory Visit: Payer: Self-pay | Admitting: Family Medicine

## 2019-03-03 DIAGNOSIS — J019 Acute sinusitis, unspecified: Secondary | ICD-10-CM

## 2019-03-04 MED ORDER — FLUTICASONE PROPIONATE 50 MCG/ACT NA SUSP: NASAL | 3 refills | Status: DC

## 2019-03-04 MED ORDER — ALBUTEROL SULFATE (2.5 MG/3ML) 0.083% IN NEBU: 2.5000 mg | INHALATION_SOLUTION | Freq: Four times a day (QID) | RESPIRATORY_TRACT | 1 refills | Status: DC | PRN

## 2019-03-04 NOTE — Addendum Note (Signed)
Addended by: Julious Payer D on: 03/04/2019 09:13 AM   Modules accepted: Orders

## 2019-03-09 ENCOUNTER — Telehealth: Payer: Self-pay | Admitting: *Deleted

## 2019-03-09 DIAGNOSIS — I10 Essential (primary) hypertension: Secondary | ICD-10-CM

## 2019-03-09 MED ORDER — METOPROLOL SUCCINATE ER 50 MG PO TB24: 50.0000 mg | ORAL_TABLET | Freq: Every day | ORAL | 3 refills | Status: DC

## 2019-03-09 NOTE — Telephone Encounter (Signed)
Fax from [facility] Clarification on frequency & directions on Metoprolol 50 mg Please clarify and send in new Rx

## 2019-03-09 NOTE — Telephone Encounter (Signed)
I sent a new prescription

## 2019-04-11 ENCOUNTER — Other Ambulatory Visit: Payer: Self-pay

## 2019-04-11 ENCOUNTER — Encounter: Payer: Self-pay | Admitting: Family Medicine

## 2019-04-11 ENCOUNTER — Ambulatory Visit (INDEPENDENT_AMBULATORY_CARE_PROVIDER_SITE_OTHER): Payer: Medicare Other | Admitting: Family Medicine

## 2019-04-11 DIAGNOSIS — E1169 Type 2 diabetes mellitus with other specified complication: Secondary | ICD-10-CM | POA: Diagnosis not present

## 2019-04-11 DIAGNOSIS — K219 Gastro-esophageal reflux disease without esophagitis: Secondary | ICD-10-CM

## 2019-04-11 DIAGNOSIS — I10 Essential (primary) hypertension: Secondary | ICD-10-CM

## 2019-04-11 DIAGNOSIS — J449 Chronic obstructive pulmonary disease, unspecified: Secondary | ICD-10-CM

## 2019-04-11 DIAGNOSIS — E785 Hyperlipidemia, unspecified: Secondary | ICD-10-CM

## 2019-04-11 DIAGNOSIS — E1159 Type 2 diabetes mellitus with other circulatory complications: Secondary | ICD-10-CM

## 2019-04-11 NOTE — Progress Notes (Signed)
Virtual Visit via telephone Note  I connected with Vincent Collins on 04/11/19 at 0927 by telephone and verified that I am speaking with the correct person using two identifiers. Vincent Collins is currently located at home and no other people are currently with her during visit. The provider, Fransisca Kaufmann Santiago Graf, MD is located in their office at time of visit.  Call ended at 0933  I discussed the limitations, risks, security and privacy concerns of performing an evaluation and management service by telephone and the availability of in person appointments. I also discussed with the patient that there may be a patient responsible charge related to this service. The patient expressed understanding and agreed to proceed.   History and Present Illness: Type 2 diabetes mellitus Patient comes in today for recheck of his diabetes. Patient has been currently taking metformin. Patient is currently on an ACE inhibitor/ARB. Patient has not seen an ophthalmologist this year. Patient denies any issues with their feet.   Hyperlipidemia Patient is coming in for recheck of his hyperlipidemia. The patient is currently taking atorvastatin. They deny any issues with myalgias or history of liver damage from it. They deny any focal numbness or weakness or chest pain.   GERD Patient is currently on omeprazole.  She denies any major symptoms or abdominal pain or belching or burping. She denies any blood in her stool or lightheadedness or dizziness.   Hypertension Patient is currently on lisinopril and metoprolol, and their blood pressure today is 130s. Patient denies any lightheadedness or dizziness. Patient denies headaches, blurred vision, chest pains, shortness of breath, or weakness. Denies any side effects from medication and is content with current medication.   COPD Patient is coming in for COPD recheck today.  He is currently on flonase and albuterol.  He has a mild chronic cough but denies any major coughing  spells or wheezing spells.  He has 0nighttime symptoms per week and 0daytime symptoms per week currently.   No diagnosis found.  Outpatient Encounter Medications as of 04/11/2019  Medication Sig   ACCU-CHEK SOFTCLIX LANCETS lancets CHECK BLOOD SUGAR ONCE DAILY OR AS DIRECTED   albuterol (PROAIR HFA) 108 (90 Base) MCG/ACT inhaler USE 2 PUFFS EVERY 6 HOURS  AS NEEDED FOR WHEEZING   albuterol (PROVENTIL) (2.5 MG/3ML) 0.083% nebulizer solution Take 3 mLs (2.5 mg total) by nebulization 4 (four) times daily as needed for wheezing or shortness of breath.   aspirin 81 MG EC tablet Take 81 mg by mouth daily.     atorvastatin (LIPITOR) 40 MG tablet Take 1 tablet (40 mg total) by mouth daily.   Blood Glucose Monitoring Suppl (ACCU-CHEK AVIVA CONNECT) w/Device KIT 1 kit daily by Does not apply route.   Cholecalciferol (VITAMIN D) 1000 UNITS capsule Take 1,000 Units by mouth 2 (two) times daily.    fluticasone (FLONASE) 50 MCG/ACT nasal spray USE 1 SPRAY INTO BOTH  NOSTRILS 2 TIMES DAILY AS  NEEDED FOR ALLERGIES OR  RHINITIS.   glucose blood (ACCU-CHEK AVIVA PLUS) test strip [place] to check blood sugars once daily   lisinopril (PRINIVIL,ZESTRIL) 20 MG tablet Take 1 tablet (20 mg total) by mouth daily.   metFORMIN (GLUCOPHAGE-XR) 500 MG 24 hr tablet Take 1 tablet (500 mg total) by mouth 2 (two) times daily.   metoprolol succinate (TOPROL-XL) 50 MG 24 hr tablet Take 1 tablet (50 mg total) by mouth daily. Take with or immediately following a meal.   omeprazole (PRILOSEC) 20 MG capsule Take 1 capsule (  20 mg total) by mouth daily.   tamsulosin (FLOMAX) 0.4 MG CAPS capsule TAKE 2 CAPSULES BY MOUTH  DAILY AFTER SUPPER   No facility-administered encounter medications on file as of 04/11/2019.     Review of Systems  Constitutional: Negative for chills and fever.  HENT: Positive for congestion (Allergy congestion). Negative for sinus pressure, sinus pain, sneezing and sore throat.   Eyes: Negative for  visual disturbance.  Respiratory: Negative for cough, shortness of breath and wheezing.   Cardiovascular: Negative for chest pain and leg swelling.  Musculoskeletal: Negative for back pain and gait problem.  Skin: Negative for rash.  Neurological: Negative for dizziness, weakness, light-headedness and numbness.  All other systems reviewed and are negative.   Observations/Objective: Patient sounds comfortable and in no acute distress  Assessment and Plan: Problem List Items Addressed This Visit      Cardiovascular and Mediastinum   Hypertension associated with diabetes ([place])     Respiratory   COPD (chronic obstructive pulmonary disease) (HCC)     Digestive   GERD (gastroesophageal reflux disease)     Endocrine   Hyperlipidemia associated with type 2 diabetes mellitus ([place])   Type 2 diabetes mellitus ([place]) - Primary       Follow Up Instructions:  Recommended follow-up in 3 months with office visit hopefully labs.  Continue current medication, no changes, it sounds like everything is doing well for him as far as he can tell.   I discussed the assessment and treatment plan with the patient. The patient was provided an opportunity to ask questions and all were answered. The patient agreed with the plan and demonstrated an understanding of the instructions.   The patient was advised to call back or seek an in-person evaluation if the symptoms worsen or if the condition fails to improve as anticipated.  The above assessment and management plan was discussed with the patient. The patient verbalized understanding of and has agreed to the management plan. Patient is aware to call the clinic if symptoms persist or worsen. Patient is aware when to return to the clinic for a follow-up visit. Patient educated on when it is appropriate to go to the emergency department.    I provided 6 minutes of non-face-to-face time during this encounter.    [name], MD

## 2019-04-19 ENCOUNTER — Telehealth: Payer: Self-pay | Admitting: Family Medicine

## 2019-04-19 NOTE — Telephone Encounter (Signed)
No answer at patient's number.    Which medication is needed, metoprolol or lisinopril?

## 2019-04-22 NOTE — Telephone Encounter (Signed)
No response from patient since three days earlier when original call came in.Left message to please call our office.  There are refills on both blood pressure medications.

## 2019-07-18 ENCOUNTER — Other Ambulatory Visit: Payer: Self-pay

## 2019-07-18 ENCOUNTER — Telehealth: Payer: Self-pay | Admitting: Family Medicine

## 2019-07-18 NOTE — Telephone Encounter (Signed)
Changed to tele visit- patient aware.

## 2019-07-19 ENCOUNTER — Ambulatory Visit (INDEPENDENT_AMBULATORY_CARE_PROVIDER_SITE_OTHER): Payer: Medicare Other | Admitting: Family Medicine

## 2019-07-19 ENCOUNTER — Encounter: Payer: Self-pay | Admitting: Family Medicine

## 2019-07-19 DIAGNOSIS — E1159 Type 2 diabetes mellitus with other circulatory complications: Secondary | ICD-10-CM | POA: Diagnosis not present

## 2019-07-19 DIAGNOSIS — K219 Gastro-esophageal reflux disease without esophagitis: Secondary | ICD-10-CM | POA: Diagnosis not present

## 2019-07-19 DIAGNOSIS — I152 Hypertension secondary to endocrine disorders: Secondary | ICD-10-CM

## 2019-07-19 DIAGNOSIS — E785 Hyperlipidemia, unspecified: Secondary | ICD-10-CM

## 2019-07-19 DIAGNOSIS — I10 Essential (primary) hypertension: Secondary | ICD-10-CM

## 2019-07-19 DIAGNOSIS — N401 Enlarged prostate with lower urinary tract symptoms: Secondary | ICD-10-CM

## 2019-07-19 DIAGNOSIS — E1169 Type 2 diabetes mellitus with other specified complication: Secondary | ICD-10-CM

## 2019-07-19 DIAGNOSIS — R351 Nocturia: Secondary | ICD-10-CM

## 2019-07-19 NOTE — Progress Notes (Signed)
Virtual Visit via telephone Note  I connected with Vincent Collins on 07/19/19 at 0824 by telephone and verified that I am speaking with the correct person using two identifiers. Vincent Collins is currently located at home and no other people are currently with her during visit. The provider, [name] Parton, MD is located in their office at time of visit.  Call ended at [phone]  I discussed the limitations, risks, security and privacy concerns of performing an evaluation and management service by telephone and the availability of in person appointments. I also discussed with the patient that there may be a patient responsible charge related to this service. The patient expressed understanding and agreed to proceed.  Weight 244 at home History and Present Illness: Type 2 diabetes mellitus Patient comes in today for recheck of his diabetes. Patient has been currently taking metformin and bs is 131 today. Patient is currently on an ACE inhibitor/ARB. Patient has not seen an ophthalmologist this year. Patient denies any issues with their feet.   Hypertension Patient is currently on lisinopril and metoprolol and BP 141/78 today at home, and their blood pressure today is 141/78. Patient denies any lightheadedness or dizziness. Patient denies headaches, blurred vision, chest pains, shortness of breath, or weakness. Denies any side effects from medication and is content with current medication.   COPD Patient is coming in for COPD recheck today.  He is currently on albuterol.  He has a mild chronic cough but denies any major coughing spells or wheezing spells.  He has 0nighttime symptoms per week and 0daytime symptoms per week currently.   Hyperlipidemia Patient is coming in for recheck of his hyperlipidemia. The patient is currently taking atorvastatin. They deny any issues with myalgias or history of liver damage from it. They deny any focal numbness or weakness or chest pain.   GERD Patient is  currently on omeprazole.  She denies any major symptoms or abdominal pain or belching or burping. She denies any blood in her stool or lightheadedness or dizziness.   No diagnosis found.  Outpatient Encounter Medications as of 07/19/2019  Medication Sig  . ACCU-CHEK SOFTCLIX LANCETS lancets CHECK BLOOD SUGAR ONCE DAILY OR AS DIRECTED  . albuterol (PROAIR HFA) 108 (90 Base) MCG/ACT inhaler USE 2 PUFFS EVERY 6 HOURS  AS NEEDED FOR WHEEZING  . albuterol (PROVENTIL) (2.5 MG/3ML) 0.083% nebulizer solution Take 3 mLs (2.5 mg total) by nebulization 4 (four) times daily as needed for wheezing or shortness of breath.  Marland Kitchen aspirin 81 MG EC tablet Take 81 mg by mouth daily.    Marland Kitchen atorvastatin (LIPITOR) 40 MG tablet Take 1 tablet (40 mg total) by mouth daily.  . Blood Glucose Monitoring Suppl (ACCU-CHEK AVIVA CONNECT) w/Device KIT 1 kit daily by Does not apply route.  . Cholecalciferol (VITAMIN D) 1000 UNITS capsule Take 1,000 Units by mouth 2 (two) times daily.   . fluticasone (FLONASE) 50 MCG/ACT nasal spray USE 1 SPRAY INTO BOTH  NOSTRILS 2 TIMES DAILY AS  NEEDED FOR ALLERGIES OR  RHINITIS.  Marland Kitchen glucose blood (ACCU-CHEK AVIVA PLUS) test strip [place] to check blood sugars once daily  . lisinopril (PRINIVIL,ZESTRIL) 20 MG tablet Take 1 tablet (20 mg total) by mouth daily.  . metFORMIN (GLUCOPHAGE-XR) 500 MG 24 hr tablet Take 1 tablet (500 mg total) by mouth 2 (two) times daily.  . metoprolol succinate (TOPROL-XL) 50 MG 24 hr tablet Take 1 tablet (50 mg total) by mouth daily. Take with or immediately following  a meal.  . omeprazole (PRILOSEC) 20 MG capsule Take 1 capsule (20 mg total) by mouth daily.  . tamsulosin (FLOMAX) 0.4 MG CAPS capsule TAKE 2 CAPSULES BY MOUTH  DAILY AFTER SUPPER   No facility-administered encounter medications on file as of 07/19/2019.     Review of Systems  Constitutional: Negative for chills and fever.  Eyes: Negative for visual disturbance.  Respiratory: Negative for shortness of  breath and wheezing.   Cardiovascular: Negative for chest pain and leg swelling.  Musculoskeletal: Negative for back pain and gait problem.  Skin: Negative for rash.  Neurological: Negative for dizziness, weakness and light-headedness.  All other systems reviewed and are negative.   Observations/Objective: Patient sounds comfortable and in no acute distress  Assessment and Plan: Problem List Items Addressed This Visit      Cardiovascular and Mediastinum   Hypertension associated with diabetes ([place])     Digestive   GERD (gastroesophageal reflux disease)     Endocrine   Hyperlipidemia associated with type 2 diabetes mellitus ([place])   Type 2 diabetes mellitus ([place]) - Primary     Genitourinary   BPH (benign prostatic hyperplasia)       Follow Up Instructions: Follow up in 3 months for diabetes and hypertension, sounds like he is doing really well, no changes.    I discussed the assessment and treatment plan with the patient. The patient was provided an opportunity to ask questions and all were answered. The patient agreed with the plan and demonstrated an understanding of the instructions.   The patient was advised to call back or seek an in-person evaluation if the symptoms worsen or if the condition fails to improve as anticipated.  The above assessment and management plan was discussed with the patient. The patient verbalized understanding of and has agreed to the management plan. Patient is aware to call the clinic if symptoms persist or worsen. Patient is aware when to return to the clinic for a follow-up visit. Patient educated on when it is appropriate to go to the emergency department.    I provided 9 minutes of non-face-to-face time during this encounter.    [name], MD

## 2019-10-26 ENCOUNTER — Ambulatory Visit (INDEPENDENT_AMBULATORY_CARE_PROVIDER_SITE_OTHER): Payer: Medicare Other | Admitting: Family Medicine

## 2019-10-26 ENCOUNTER — Encounter: Payer: Self-pay | Admitting: Family Medicine

## 2019-10-26 DIAGNOSIS — E1169 Type 2 diabetes mellitus with other specified complication: Secondary | ICD-10-CM

## 2019-10-26 DIAGNOSIS — E1159 Type 2 diabetes mellitus with other circulatory complications: Secondary | ICD-10-CM

## 2019-10-26 DIAGNOSIS — I10 Essential (primary) hypertension: Secondary | ICD-10-CM | POA: Diagnosis not present

## 2019-10-26 DIAGNOSIS — J449 Chronic obstructive pulmonary disease, unspecified: Secondary | ICD-10-CM

## 2019-10-26 DIAGNOSIS — I152 Hypertension secondary to endocrine disorders: Secondary | ICD-10-CM

## 2019-10-26 DIAGNOSIS — E785 Hyperlipidemia, unspecified: Secondary | ICD-10-CM

## 2019-10-26 DIAGNOSIS — K219 Gastro-esophageal reflux disease without esophagitis: Secondary | ICD-10-CM | POA: Diagnosis not present

## 2019-10-26 MED ORDER — ALBUTEROL SULFATE (2.5 MG/3ML) 0.083% IN NEBU: 2.5000 mg | INHALATION_SOLUTION | Freq: Four times a day (QID) | RESPIRATORY_TRACT | 1 refills | Status: DC | PRN

## 2019-10-26 MED ORDER — BLOOD GLUCOSE METER KIT: PACK | 0 refills | Status: DC

## 2019-10-26 NOTE — Progress Notes (Signed)
Virtual Visit via telephone Note  I connected with Vincent Collins on 10/26/19 at 1137 by telephone and verified that I am speaking with the correct person using two identifiers. Vincent Collins is currently located at home and no other people are currently with her during visit. The provider, Fransisca Kaufmann Narelle Schoening, MD is located in their office at time of visit.  Call ended at 1150  I discussed the limitations, risks, security and privacy concerns of performing an evaluation and management service by telephone and the availability of in person appointments. I also discussed with the patient that there may be a patient responsible charge related to this service. The patient expressed understanding and agreed to proceed.   History and Present Illness: Type 2 diabetes mellitus Patient comes in today for recheck of his diabetes. Patient has been currently taking metformin 500. Patient is currently on an ACE inhibitor/ARB. Patient has not seen an ophthalmologist this year. Patient denies any issues with their feet.   Hypertension Patient is currently on lisinopril and metoprolol, and their blood pressure today is 144/79. Patient denies any lightheadedness or dizziness. Patient denies headaches, blurred vision, chest pains, shortness of breath, or weakness. Denies any side effects from medication and is content with current medication.   Hyperlipidemia Patient is coming in for recheck of his hyperlipidemia. The patient is currently taking atorvastatin. They deny any issues with myalgias or history of liver damage from it. They deny any focal numbness or weakness or chest pain.  GERD Patient is currently on omeprazole.  She denies any major symptoms or abdominal pain or belching or burping. She denies any blood in her stool or lightheadedness or dizziness.    COPD Patient is coming in for COPD recheck today.  He is currently on albuterol as needed.  He has a mild chronic cough but denies any major  coughing spells or wheezing spells.  He has 0nighttime symptoms per week and 0daytime symptoms per week currently.  He says is been doing a lot better recently and has not needed the albuterol like he had before.  No diagnosis found.  Outpatient Encounter Medications as of 10/26/2019  Medication Sig  . ACCU-CHEK SOFTCLIX LANCETS lancets CHECK BLOOD SUGAR ONCE DAILY OR AS DIRECTED  . albuterol (PROAIR HFA) 108 (90 Base) MCG/ACT inhaler USE 2 PUFFS EVERY 6 HOURS  AS NEEDED FOR WHEEZING  . albuterol (PROVENTIL) (2.5 MG/3ML) 0.083% nebulizer solution Take 3 mLs (2.5 mg total) by nebulization 4 (four) times daily as needed for wheezing or shortness of breath.  Marland Kitchen aspirin 81 MG EC tablet Take 81 mg by mouth daily.    Marland Kitchen atorvastatin (LIPITOR) 40 MG tablet Take 1 tablet (40 mg total) by mouth daily.  . Blood Glucose Monitoring Suppl (ACCU-CHEK AVIVA CONNECT) w/Device KIT 1 kit daily by Does not apply route.  . Cholecalciferol (VITAMIN D) 1000 UNITS capsule Take 1,000 Units by mouth 2 (two) times daily.   . fluticasone (FLONASE) 50 MCG/ACT nasal spray USE 1 SPRAY INTO BOTH  NOSTRILS 2 TIMES DAILY AS  NEEDED FOR ALLERGIES OR  RHINITIS.  Marland Kitchen glucose blood (ACCU-CHEK AVIVA PLUS) test strip [place] to check blood sugars once daily  . lisinopril (PRINIVIL,ZESTRIL) 20 MG tablet Take 1 tablet (20 mg total) by mouth daily.  . metFORMIN (GLUCOPHAGE-XR) 500 MG 24 hr tablet Take 1 tablet (500 mg total) by mouth 2 (two) times daily.  . metoprolol succinate (TOPROL-XL) 50 MG 24 hr tablet Take 1 tablet (50 mg total)  by mouth daily. Take with or immediately following a meal.  . omeprazole (PRILOSEC) 20 MG capsule Take 1 capsule (20 mg total) by mouth daily.  . tamsulosin (FLOMAX) 0.4 MG CAPS capsule TAKE 2 CAPSULES BY MOUTH  DAILY AFTER SUPPER   No facility-administered encounter medications on file as of 10/26/2019.     Review of Systems  Constitutional: Negative for chills and fever.  Eyes: Negative for discharge.   Respiratory: Negative for shortness of breath and wheezing.   Cardiovascular: Negative for chest pain and leg swelling.  Musculoskeletal: Negative for back pain and gait problem.  Skin: Negative for rash.  Neurological: Negative for dizziness, weakness and numbness.  All other systems reviewed and are negative.   Observations/Objective: Patient sounds comfortable and in no acute distress  Assessment and Plan: Problem List Items Addressed This Visit      Cardiovascular and Mediastinum   Hypertension associated with diabetes ([place])   Relevant Orders   For home use only DME Other see comment     Respiratory   COPD (chronic obstructive pulmonary disease) ([place])   Relevant Medications   albuterol (PROVENTIL) (2.5 MG/3ML) 0.083% nebulizer solution     Digestive   GERD (gastroesophageal reflux disease)     Endocrine   Hyperlipidemia associated with type 2 diabetes mellitus ([place])   Type 2 diabetes mellitus ([place]) - Primary   Relevant Medications   blood glucose meter kit and supplies       Follow Up Instructions: 3 months diabetes recheck, sounds like he is doing very well, has a consult cannot come in today, will see back in 3 months    I discussed the assessment and treatment plan with the patient. The patient was provided an opportunity to ask questions and all were answered. The patient agreed with the plan and demonstrated an understanding of the instructions.   The patient was advised to call back or seek an in-person evaluation if the symptoms worsen or if the condition fails to improve as anticipated.  The above assessment and management plan was discussed with the patient. The patient verbalized understanding of and has agreed to the management plan. Patient is aware to call the clinic if symptoms persist or worsen. Patient is aware when to return to the clinic for a follow-up visit. Patient educated on when it is appropriate to go to the emergency department.    I  provided 13 minutes of non-face-to-face time during this encounter.    [name], MD

## 2019-12-08 ENCOUNTER — Telehealth: Payer: Self-pay | Admitting: Family Medicine

## 2019-12-08 DIAGNOSIS — M545 Low back pain, unspecified: Secondary | ICD-10-CM

## 2019-12-08 NOTE — Telephone Encounter (Signed)
I placed the referral for the patient.

## 2019-12-13 ENCOUNTER — Other Ambulatory Visit: Payer: Self-pay

## 2019-12-13 ENCOUNTER — Encounter: Payer: Self-pay | Admitting: Physical Therapy

## 2019-12-13 ENCOUNTER — Ambulatory Visit: Payer: Medicare Other | Attending: Family Medicine | Admitting: Physical Therapy

## 2019-12-13 DIAGNOSIS — R293 Abnormal posture: Secondary | ICD-10-CM | POA: Insufficient documentation

## 2019-12-13 DIAGNOSIS — M5441 Lumbago with sciatica, right side: Secondary | ICD-10-CM

## 2019-12-13 DIAGNOSIS — M6281 Muscle weakness (generalized): Secondary | ICD-10-CM | POA: Diagnosis not present

## 2019-12-13 NOTE — Therapy (Signed)
[facility] [place], [place], [address] Phone: [phone]   Fax:  [phone]  Physical Therapy Evaluation  Patient Details  Name: Vincent Collins MRN: [id number] Date of Birth: 11/16/1941 Referring Provider (PT): Caryl Pina, MD   Encounter Date: 12/13/2019  PT End of Session - 12/13/19 2124    Visit Number  1    Number of Visits  12    Date for PT Re-Evaluation  01/31/20    Authorization Type  FOTO every 5th visit, progress note every 10th visit, KX modifier at 15th visit    PT Start Time  1430    PT Stop Time  1516    PT Time Calculation (min)  46 min    Activity Tolerance  Patient tolerated treatment well    Behavior During Therapy  [facility] for tasks assessed/performed       Past Medical History:  Diagnosis Date  . COPD (chronic obstructive pulmonary disease) ([place])   . Diabetes mellitus without complication ([place])   . Diverticulitis   . Hydronephrosis of left kidney   . Hyperlipidemia   . Hypertension   . Left ureteral stone    obstructing  . Renal calculus or stone 2015    Past Surgical History:  Procedure Laterality Date  . 2d echo  04/04/09  . CYSTOSCOPY WITH RETROGRADE PYELOGRAM, URETEROSCOPY AND STENT PLACEMENT Left 10/11/2014   Procedure: CYSTOSCOPY WITH RETROGRADE PYELOGRAM, DIAGNOSTIC URETEROSCOPY AND STENT PLACEMENT;  Surgeon: Alexis Frock, MD;  Location: [facility];  Service: Urology;  Laterality: Left;  . LUMBAR DISC SURGERY    . SPINE SURGERY      There were no vitals filed for this visit.   Subjective Assessment - 12/13/19 1507    Subjective  COVID-19 screening performed upon arrival.Patient arrives to physical therapy with reports of right sided low back pain, neurological symptoms to his lateral right calf, and difficulties with home and work activities that began on 12/03/2019. Patient is unsure of mechanism of injury but reported he tends to "over do it" when pain levels  are low. Patient also reports a constant dull ache and reports he feels "as if my back is out of line". Patient reports ability to perform all ADLs but with pain. Patient reports pain at worst as 6/10 and pain at best as 4/10. Patient's goals are to decrease pain and improve ability to perform home and work activities.    Pertinent History  HTN, DM, COPD    Limitations  Standing;Walking;House hold activities    Diagnostic tests  n/a    Patient Stated Goals  decrease pain    Currently in Pain?  Yes    Pain Score  4     Pain Location  Back    Pain Orientation  Right;Lower    Pain Descriptors / Indicators  Aching    Pain Type  Acute pain    Pain Radiating Towards  right lateral calf    Pain Onset  1 to 4 weeks ago    Pain Frequency  Constant    Aggravating Factors   "over doing it"    Pain Relieving Factors  "sometimes ice pack"    Effect of Pain on Daily Activities  "i feel like my back could be out of line"         [facility] PT Assessment - 12/13/19 0001      Assessment   Medical Diagnosis  acute bilateral low back pain without sciatica  Referring Provider (PT)  Caryl Pina, MD    Onset Date/Surgical Date  12/04/19   onset "about a week ago"   Next MD Visit  Jan/February 2021    Prior Therapy  yes      Precautions   Precautions  None      Restrictions   Weight Bearing Restrictions  No      Balance Screen   Has the patient fallen in the past 6 months  No    Has the patient had a decrease in activity level because of a fear of falling?   No    Is the patient reluctant to leave their home because of a fear of falling?   No      Home Film/video editor residence    Living Arrangements  Spouse/significant other      Prior Function   Level of Independence  Independent    Vocation  Part time employment      Observation/Other Assessments   Focus on Therapeutic Outcomes (FOTO)   51% limitation      Posture/Postural Control   Posture/Postural  Control  Postural limitations    Postural Limitations  Rounded Shoulders;Forward head;Increased thoracic kyphosis;Anterior pelvic tilt      ROM / Strength   AROM / PROM / Strength  Strength      Strength   Strength Assessment Site  Knee;Hip    Right/Left Hip  Right;Left    Right Hip Flexion  4/5    Right Hip Extension  3-/5    Right Hip ABduction  3-/5    Left Hip Flexion  4/5    Left Hip Extension  3-/5    Left Hip ABduction  3-/5    Right/Left Knee  Left;Right    Right Knee Flexion  4/5    Right Knee Extension  4/5    Left Knee Flexion  4/5    Left Knee Extension  4/5      Palpation   SI assessment   right anterior tilt, improvements in length after SIJ MIET    Palpation comment  tenderness to right glute, increased muscle tone in lumbar paraspinals and QL      Transfers   Transfers  Independent with all Transfers                Objective measurements completed on examination: See above findings.      [facility] Adult PT Treatment/Exercise - 12/13/19 0001      Modalities   Modalities  Electrical Stimulation      Electrical Stimulation   Electrical Stimulation Location  right low back SIJ region    Electrical Stimulation Action  pre-mod    Electrical Stimulation Parameters  80-150 hz x10 mins    Electrical Stimulation Goals  Pain             PT Education - 12/13/19 2122    Education Details  Draw ins, hip pillow squeeze, single knee to chest, bridges    Person(s) Educated  Patient    Methods  Explanation;Demonstration;Handout    Comprehension  Verbalized understanding;Returned demonstration          PT Long Term Goals - 12/13/19 2125      PT LONG TERM GOAL #1   Title  Patient will be independent with HEP.    Time  6    Period  Weeks    Status  New      PT LONG TERM GOAL #2  Title  Patient will report ability to perform ADLs with low back pain less than or equal to 3/10.    Time  6    Period  Weeks    Status  New      PT LONG TERM  GOAL #3   Title  Patient will report a centralization of neurological symptoms to right buttocks or no neurological symptoms to indicate no nerve irritation.    Time  6    Period  Weeks    Status  New      PT LONG TERM GOAL #4   Title  Patient will demonstrate 4+/5 or greater bilateral LE MMT to improve stability during functional tasks.    Time  6    Period  Weeks    Status  New             Plan - 12/13/19 2200    Clinical Impression Statement  Patient s a 78 year old male who presents to physical therapy with right sided low back pain with neurological symptoms to right lateral calf, decreased bilateral LE MMT, and a right anterior SIJ tilt. Patient noted with good improvement after MET to address leg length discrepancy. Patient tender to palpation to right hamstrings and note with increased lumbar paraspinal muscle tone. Patient and PT discussed plan of care and HEP to maximize PT benefit. Patient reported understanding and agreement. Patient would benefit from skilled physical therapy to address deficits and address patient's goals.    Personal Factors and Comorbidities  Time since onset of injury/illness/exacerbation    Examination-Activity Limitations  Locomotion Level;Stand;Stairs    Examination-Participation Restrictions  Yard Work    Stability/Clinical Decision Making  Stable/Uncomplicated    Clinical Decision Making  Low    Rehab Potential  Good    PT Frequency  2x / week    PT Duration  6 weeks    PT Treatment/Interventions  ADLs/Self Care Home Management;Cryotherapy;Electrical Stimulation;Iontophoresis '4mg'$ /ml Dexamethasone;Moist Heat;Traction;Ultrasound;Gait training;Stair training;Functional mobility training;Therapeutic activities;Therapeutic exercise;Balance training;Neuromuscular re-education;Manual techniques;Dry needling;Passive range of motion;Patient/family education    PT Next Visit Plan  nustep, MET for leg length (anterior tilt right) single knee to chest, gentle  strengthening, modalities PRN for pain relief    PT Home Exercise Plan  see patient education section    Consulted and Agree with Plan of Care  Patient       Patient will benefit from skilled therapeutic intervention in order to improve the following deficits and impairments:  Decreased activity tolerance, Difficulty walking, Pain, Improper body mechanics, Postural dysfunction, Decreased strength  Visit Diagnosis: Acute right-sided low back pain with right-sided sciatica - Plan: PT plan of care cert/re-cert  Muscle weakness (generalized) - Plan: PT plan of care cert/re-cert  Abnormal posture - Plan: PT plan of care cert/re-cert     Problem List Patient Active Problem List   Diagnosis Date Noted  . GERD (gastroesophageal reflux disease) 10/07/2017  . Hypertension associated with diabetes ([place]) 04/02/2011  . COPD (chronic obstructive pulmonary disease) ([place]) 04/02/2011  . BPH (benign prostatic hyperplasia) 04/02/2011  . Hyperlipidemia associated with type 2 diabetes mellitus ([place]) 04/02/2011  . Vitamin D deficiency 04/02/2011  . Type 2 diabetes mellitus ([place]) 04/02/2011    Gabriela Eves, PT, DPT 12/13/2019, 10:10 PM  [facility] [address] [place], [place], [address] Phone: [phone]   Fax:  [phone]  Name: EMELIO SCHNELLER MRN: [id number] Date of Birth: [birth year]

## 2019-12-15 ENCOUNTER — Telehealth: Payer: Self-pay | Admitting: Family Medicine

## 2019-12-15 NOTE — Telephone Encounter (Signed)
Appt made

## 2019-12-15 NOTE — Telephone Encounter (Signed)
Patients wife says that patient went to physical therapy on 12/13/19 and ever since then he has been having hip and leg pain. Wants to know if there is anything that can be prescribed to patient for pain.Marland Kitchen

## 2019-12-16 ENCOUNTER — Ambulatory Visit (INDEPENDENT_AMBULATORY_CARE_PROVIDER_SITE_OTHER): Payer: Medicare Other | Admitting: Physician Assistant

## 2019-12-16 ENCOUNTER — Encounter: Payer: Self-pay | Admitting: Physician Assistant

## 2019-12-16 ENCOUNTER — Ambulatory Visit (INDEPENDENT_AMBULATORY_CARE_PROVIDER_SITE_OTHER): Payer: Medicare Other

## 2019-12-16 ENCOUNTER — Other Ambulatory Visit: Payer: Self-pay

## 2019-12-16 VITALS — BP 158/87 | HR 80 | Temp 98.6°F | Ht 66.0 in | Wt 244.8 lb

## 2019-12-16 DIAGNOSIS — R29898 Other symptoms and signs involving the musculoskeletal system: Secondary | ICD-10-CM | POA: Diagnosis not present

## 2019-12-16 DIAGNOSIS — M545 Low back pain: Secondary | ICD-10-CM | POA: Diagnosis not present

## 2019-12-16 DIAGNOSIS — M5431 Sciatica, right side: Secondary | ICD-10-CM | POA: Diagnosis not present

## 2019-12-16 DIAGNOSIS — M5441 Lumbago with sciatica, right side: Secondary | ICD-10-CM

## 2019-12-16 IMAGING — DX DG LUMBAR SPINE 2-3V
2 series · 2 of 2 positions shown · non-contrast
Comparison: None.

CLINICAL DATA: Right-sided low back pain with sciatica.

EXAM:
LUMBAR SPINE - 2-3 VIEW

[l-spine ap]
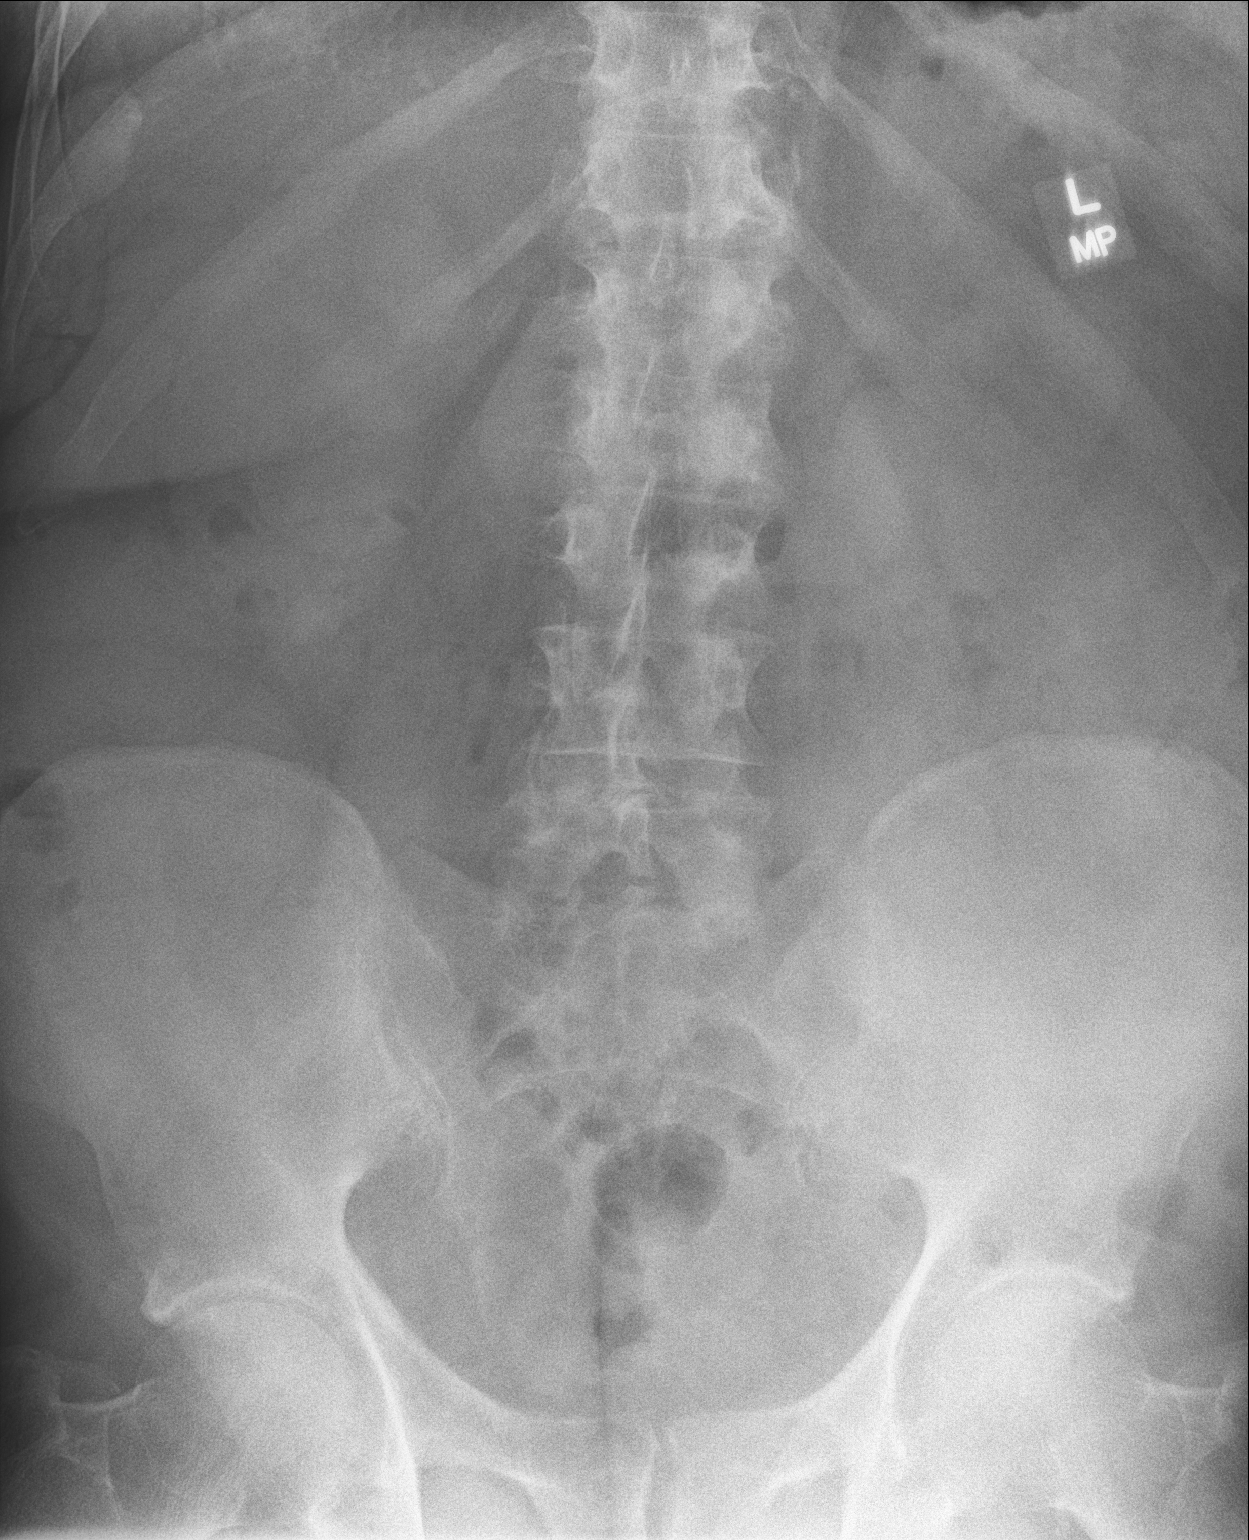

[l-spine lat]
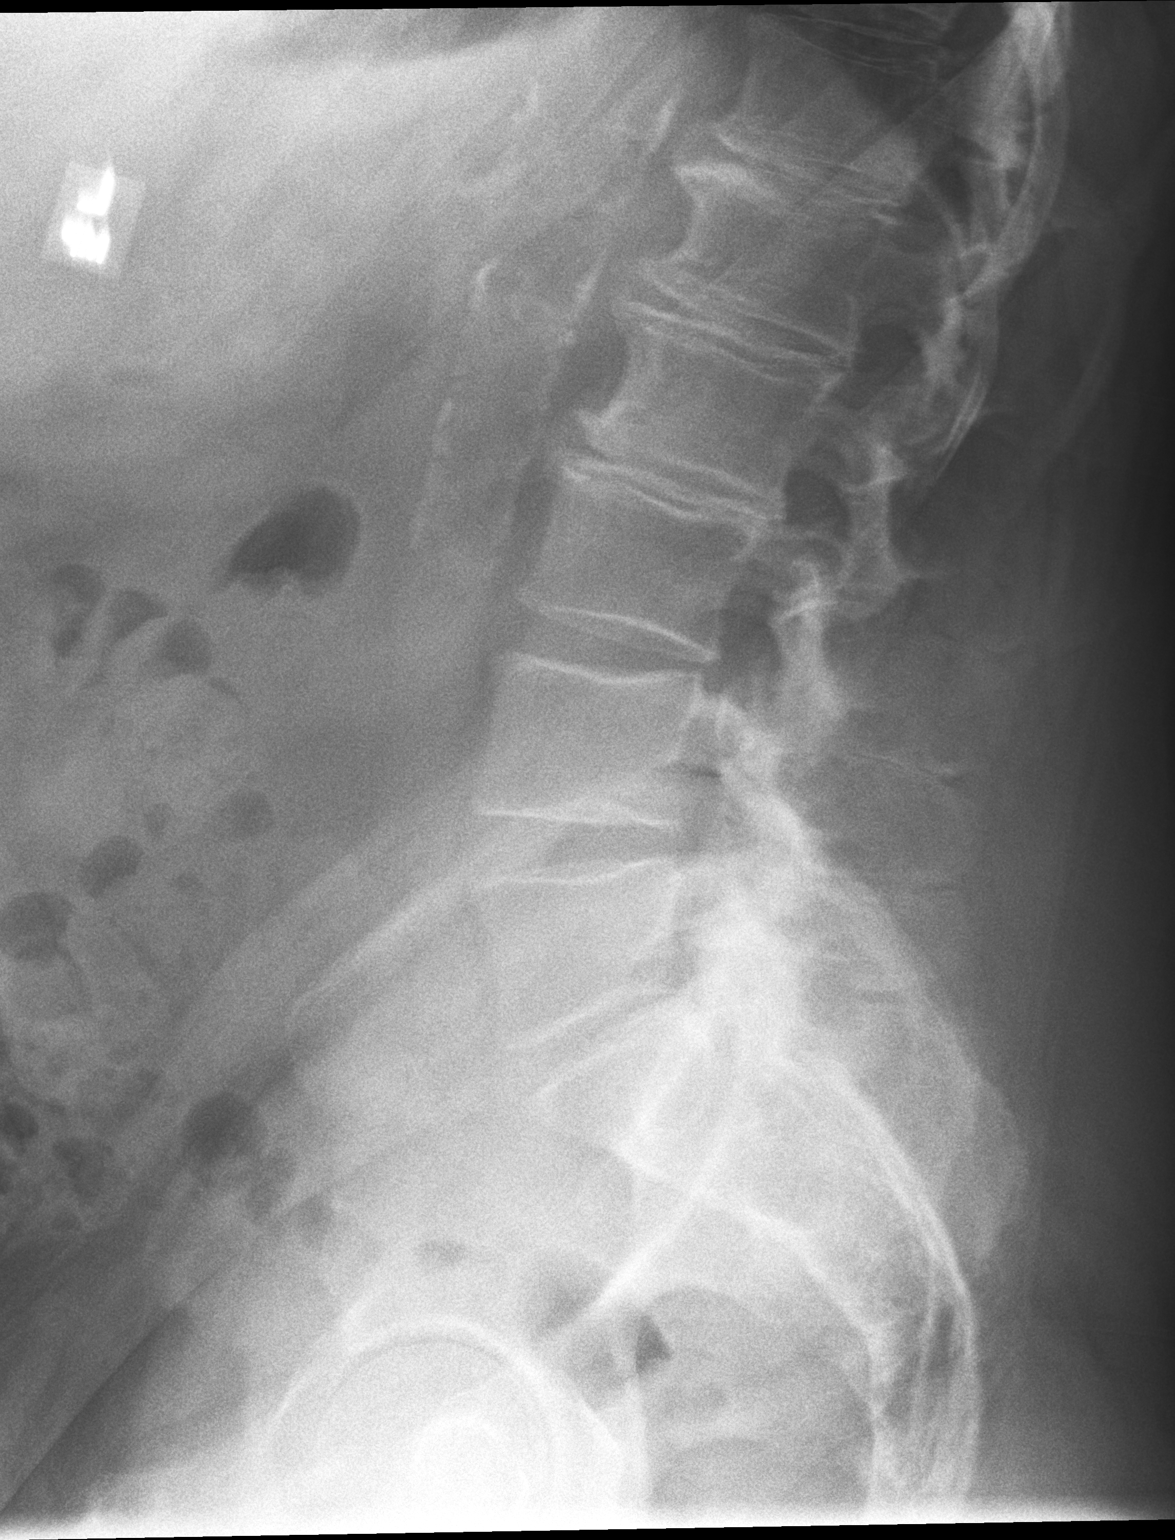

[2 of 2 positions shown; findings below may reference images not displayed]

FINDINGS: No fracture or spondylolisthesis is noted. Severe degenerative disc
disease is noted at L1-2 and L2-3 with anterior osteophyte
formation.
IMPRESSION: Severe multilevel degenerative disc disease. No acute abnormality
seen in the lumbar spine.

## 2019-12-16 MED ORDER — HYDROCODONE-ACETAMINOPHEN 5-325 MG PO TABS: 1.0000 | ORAL_TABLET | Freq: Four times a day (QID) | ORAL | 0 refills | Status: DC | PRN

## 2019-12-16 MED ORDER — METHOCARBAMOL 500 MG PO TABS: 500.0000 mg | ORAL_TABLET | Freq: Four times a day (QID) | ORAL | 0 refills | Status: DC

## 2019-12-16 MED ORDER — PREDNISONE 10 MG (48) PO TBPK: ORAL_TABLET | ORAL | 0 refills | Status: DC

## 2019-12-16 NOTE — Progress Notes (Signed)
Acute Office Visit  Subjective:    Patient ID: Vincent Collins, male    DOB: August 26, 1941, 78 y.o.   MRN: [id number]  Chief Complaint  Patient presents with  . Hip Pain    right   . Leg Pain    Hip Pain  The incident occurred more than 1 week ago. The incident occurred at work. The injury mechanism was a twisting injury. The pain is present in the right thigh, right leg and right hip. The quality of the pain is described as shooting and stabbing. The pain is at a severity of 10/10. The pain is severe. The pain has been constant since onset. Pertinent negatives include no numbness. The symptoms are aggravated by movement and weight bearing. He has tried ice and heat for the symptoms.  Back Pain This is a chronic problem. The current episode started 1 to 4 weeks ago. The problem occurs constantly. The problem has been rapidly worsening since onset. The pain is present in the sacro-iliac. The quality of the pain is described as shooting and stabbing. The pain radiates to the right thigh. The pain is at a severity of 10/10. The pain is severe. The pain is the same all the time. Stiffness is present all day. Associated symptoms include weakness. Pertinent negatives include no abdominal pain, chest pain, headaches or numbness. He has tried ice and heat for the symptoms.   He tried to do PT but after one session he was much worse. In the past it had helped a lot.  Past Medical History:  Diagnosis Date  . COPD (chronic obstructive pulmonary disease) ([place])   . Diabetes mellitus without complication ([place])   . Diverticulitis   . Hydronephrosis of left kidney   . Hyperlipidemia   . Hypertension   . Left ureteral stone    obstructing  . Renal calculus or stone 2015    Past Surgical History:  Procedure Laterality Date  . 2d echo  04/04/09  . CYSTOSCOPY WITH RETROGRADE PYELOGRAM, URETEROSCOPY AND STENT PLACEMENT Left 10/11/2014   Procedure: CYSTOSCOPY WITH RETROGRADE PYELOGRAM, DIAGNOSTIC  URETEROSCOPY AND STENT PLACEMENT;  Surgeon: Alexis Frock, MD;  Location: [facility];  Service: Urology;  Laterality: Left;  . LUMBAR DISC SURGERY    . SPINE SURGERY      Family History  Problem Relation Age of Onset  . Early death Father        MI age 47  . Heart attack Father   . COPD Brother   . Cancer Sister     Social History   Socioeconomic History  . Marital status: Married    Spouse name: Not on file  . Number of children: Not on file  . Years of education: Not on file  . Highest education level: Not on file  Occupational History  . Not on file  Tobacco Use  . Smoking status: Former Smoker    Quit date: 06/18/1967    Years since quitting: 52.5  . Smokeless tobacco: Former Systems developer    Types: Chew  Substance and Sexual Activity  . Alcohol use: No  . Drug use: No  . Sexual activity: Not Currently  Other Topics Concern  . Not on file  Social History Narrative  . Not on file   Social Determinants of Health   Financial Resource Strain:   . Difficulty of Paying Living Expenses: Not on file  Food Insecurity:   . Worried About Charity fundraiser in the Last Year:  Not on file  . Ran Out of Food in the Last Year: Not on file  Transportation Needs:   . Lack of Transportation (Medical): Not on file  . Lack of Transportation (Non-Medical): Not on file  Physical Activity:   . Days of Exercise per Week: Not on file  . Minutes of Exercise per Session: Not on file  Stress:   . Feeling of Stress : Not on file  Social Connections:   . Frequency of Communication with Friends and Family: Not on file  . Frequency of Social Gatherings with Friends and Family: Not on file  . Attends Religious Services: Not on file  . Active Member of Clubs or Organizations: Not on file  . Attends Archivist Meetings: Not on file  . Marital Status: Not on file  Intimate Partner Violence:   . Fear of Current or Ex-Partner: Not on file  . Emotionally Abused: Not on  file  . Physically Abused: Not on file  . Sexually Abused: Not on file    Outpatient Medications Prior to Visit  Medication Sig Dispense Refill  . ACCU-CHEK SOFTCLIX LANCETS lancets CHECK BLOOD SUGAR ONCE DAILY OR AS DIRECTED 100 each 2  . albuterol (PROAIR HFA) 108 (90 Base) MCG/ACT inhaler USE 2 PUFFS EVERY 6 HOURS  AS NEEDED FOR WHEEZING 8.5 g 5  . albuterol (PROVENTIL) (2.5 MG/3ML) 0.083% nebulizer solution Take 3 mLs (2.5 mg total) by nebulization 4 (four) times daily as needed for wheezing or shortness of breath. 360 mL 1  . aspirin 81 MG EC tablet Take 81 mg by mouth daily.      Marland Kitchen atorvastatin (LIPITOR) 40 MG tablet Take 1 tablet (40 mg total) by mouth daily. 90 tablet 3  . blood glucose meter kit and supplies Dispense based on patient and insurance preference. Use up to four times daily as directed. (FOR ICD-10 E10.9, E11.9). 1 each 0  . Blood Glucose Monitoring Suppl (ACCU-CHEK AVIVA CONNECT) w/Device KIT 1 kit daily by Does not apply route. 1 kit 0  . Cholecalciferol (VITAMIN D) 1000 UNITS capsule Take 1,000 Units by mouth 2 (two) times daily.     . fluticasone (FLONASE) 50 MCG/ACT nasal spray USE 1 SPRAY INTO BOTH  NOSTRILS 2 TIMES DAILY AS  NEEDED FOR ALLERGIES OR  RHINITIS. 48 g 3  . glucose blood (ACCU-CHEK AVIVA PLUS) test strip [place] to check blood sugars once daily 100 each 2  . lisinopril (PRINIVIL,ZESTRIL) 20 MG tablet Take 1 tablet (20 mg total) by mouth daily. 90 tablet 3  . metFORMIN (GLUCOPHAGE-XR) 500 MG 24 hr tablet Take 1 tablet (500 mg total) by mouth 2 (two) times daily. 180 tablet 3  . metoprolol succinate (TOPROL-XL) 50 MG 24 hr tablet Take 1 tablet (50 mg total) by mouth daily. Take with or immediately following a meal. 90 tablet 3  . omeprazole (PRILOSEC) 20 MG capsule Take 1 capsule (20 mg total) by mouth daily. 90 capsule 3  . tamsulosin (FLOMAX) 0.4 MG CAPS capsule TAKE 2 CAPSULES BY MOUTH  DAILY AFTER SUPPER 180 capsule 3   No facility-administered medications  prior to visit.    Allergies  Allergen Reactions  . Diphenhydramine Hcl Other (See Comments)    Can tolerate    Review of Systems  Constitutional: Negative.  Negative for appetite change and fatigue.  Eyes: Negative for pain and visual disturbance.  Respiratory: Negative.  Negative for cough, chest tightness, shortness of breath and wheezing.   Cardiovascular: Negative.  Negative for chest pain, palpitations and leg swelling.  Gastrointestinal: Negative.  Negative for abdominal pain, diarrhea, nausea and vomiting.  Genitourinary: Negative.   Musculoskeletal: Positive for back pain, gait problem and myalgias.  Skin: Negative.  Negative for color change and rash.  Neurological: Positive for weakness. Negative for numbness and headaches.  Psychiatric/Behavioral: Negative.        Objective:    Physical Exam Vitals and nursing note reviewed.  Constitutional:      General: He is not in acute distress.    Appearance: He is well-developed.  HENT:     Head: Normocephalic and atraumatic.  Eyes:     Conjunctiva/sclera: Conjunctivae normal.     Pupils: Pupils are equal, round, and reactive to light.  Cardiovascular:     Rate and Rhythm: Normal rate and regular rhythm.     Heart sounds: Normal heart sounds.  Pulmonary:     Effort: Pulmonary effort is normal. No respiratory distress.     Breath sounds: Normal breath sounds.  Musculoskeletal:     Lumbar back: Spasms and tenderness present. Decreased range of motion. Positive right straight leg raise test. Negative left straight leg raise test.       Back:  Skin:    General: Skin is warm and dry.  Psychiatric:        Behavior: Behavior normal.     BP (!) 158/87   Pulse 80   Temp 98.6 F (37 C) (Temporal)   Ht '5\' 6"'$  (1.676 m)   Wt 244 lb 12.8 oz (111 kg)   SpO2 96%   BMI 39.51 kg/m  Wt Readings from Last 3 Encounters:  12/16/19 244 lb 12.8 oz (111 kg)  01/04/19 255 lb 3.2 oz (115.8 kg)  08/18/18 259 lb (117.5 kg)     Health Maintenance Due  Topic Date Due  . OPHTHALMOLOGY EXAM  05/26/2015  . FOOT EXAM  04/30/2019  . TETANUS/TDAP  05/18/2019  . HEMOGLOBIN A1C  07/05/2019  . INFLUENZA VACCINE  07/30/2019    There are no preventive care reminders to display for this patient.   No results found for: TSH Lab Results  Component Value Date   WBC 11.0 (H) 09/27/2015   HGB 16.9 09/27/2015   HCT 48.3 09/27/2015   MCV 87.5 09/27/2015   PLT 153 09/27/2015   Lab Results  Component Value Date   NA 141 01/04/2019   K 4.3 01/04/2019   CO2 23 01/04/2019   GLUCOSE 144 (H) 01/04/2019   BUN 19 01/04/2019   CREATININE 1.04 01/04/2019   BILITOT 1.0 01/04/2019   ALKPHOS 108 01/04/2019   AST 17 01/04/2019   ALT 15 01/04/2019   PROT 6.2 01/04/2019   ALBUMIN 4.1 01/04/2019   CALCIUM 7.9 (L) 01/04/2019   ANIONGAP 10 09/27/2015   Lab Results  Component Value Date   CHOL 117 08/18/2018   Lab Results  Component Value Date   HDL 37 (L) 08/18/2018   Lab Results  Component Value Date   LDLCALC 59 08/18/2018   Lab Results  Component Value Date   TRIG 104 08/18/2018   Lab Results  Component Value Date   CHOLHDL 3.2 08/18/2018   Lab Results  Component Value Date   HGBA1C 6.9 01/04/2019       Assessment & Plan:   Problem List Items Addressed This Visit    None    Visit Diagnoses    Right sciatic nerve pain    -  Primary   Relevant Medications  predniSONE (STERAPRED UNI-PAK 48 TAB) 10 MG (48) TBPK tablet   methocarbamol (ROBAXIN) 500 MG tablet   HYDROcodone-acetaminophen (NORCO/VICODIN) 5-325 MG tablet   Other Relevant Orders   DG Lumbar Spine 2-3 Views   Acute right-sided low back pain with right-sided sciatica       Relevant Medications   predniSONE (STERAPRED UNI-PAK 48 TAB) 10 MG (48) TBPK tablet   methocarbamol (ROBAXIN) 500 MG tablet   HYDROcodone-acetaminophen (NORCO/VICODIN) 5-325 MG tablet   Other Relevant Orders   DG Lumbar Spine 2-3 Views   Right leg weakness        Relevant Medications   predniSONE (STERAPRED UNI-PAK 48 TAB) 10 MG (48) TBPK tablet   methocarbamol (ROBAXIN) 500 MG tablet   HYDROcodone-acetaminophen (NORCO/VICODIN) 5-325 MG tablet     Lumbar MRI ordered   Out of work 12/16-12/27/20 May need a work note after that   Meds ordered this encounter  Medications  . predniSONE (STERAPRED UNI-PAK 48 TAB) 10 MG (48) TBPK tablet    Sig: Take as directed for 12 days    Dispense:  48 tablet    Refill:  0    Order Specific Question:   Supervising Provider    Answer:   Janora Norlander [[id number]]  . methocarbamol (ROBAXIN) 500 MG tablet    Sig: Take 1 tablet (500 mg total) by mouth 4 (four) times daily.    Dispense:  30 tablet    Refill:  0    Order Specific Question:   Supervising Provider    Answer:   Janora Norlander [[id number]]  . HYDROcodone-acetaminophen (NORCO/VICODIN) 5-325 MG tablet    Sig: Take 1 tablet by mouth every 6 (six) hours as needed for moderate pain.    Dispense:  30 tablet    Refill:  0    Order Specific Question:   Supervising Provider    Answer:   Janora Norlander [[id number]]     Terald Sleeper, PA-C

## 2019-12-19 ENCOUNTER — Encounter: Payer: Medicare Other | Admitting: Physical Therapy

## 2019-12-26 ENCOUNTER — Telehealth: Payer: Self-pay | Admitting: Family Medicine

## 2019-12-26 ENCOUNTER — Ambulatory Visit (INDEPENDENT_AMBULATORY_CARE_PROVIDER_SITE_OTHER): Payer: Medicare Other | Admitting: Family Medicine

## 2019-12-26 DIAGNOSIS — F418 Other specified anxiety disorders: Secondary | ICD-10-CM | POA: Diagnosis not present

## 2019-12-26 DIAGNOSIS — M5441 Lumbago with sciatica, right side: Secondary | ICD-10-CM

## 2019-12-26 MED ORDER — GABAPENTIN 100 MG PO CAPS: 100.0000 mg | ORAL_CAPSULE | Freq: Every day | ORAL | 0 refills | Status: DC

## 2019-12-26 MED ORDER — LORAZEPAM 0.5 MG PO TABS: ORAL_TABLET | ORAL | 0 refills | Status: DC

## 2019-12-26 NOTE — Telephone Encounter (Signed)
Apt scheduled.  

## 2019-12-26 NOTE — Progress Notes (Signed)
Telephone visit  Subjective: CC: right LE pain and situational anxiety PCP: Dettinger, Fransisca Kaufmann, MD [id number] D Scerbo is a 78 y.o. male calls for telephone consult today. Patient provides verbal consent for consult held via phone.  Due to COVID-19 pandemic this visit was conducted virtually. This visit type was conducted due to national recommendations for restrictions regarding the COVID-19 Pandemic (e.g. social distancing, sheltering in place) in an effort to limit this patient's exposure and mitigate transmission in our community. All issues noted in this document were discussed and addressed.  A physical exam was not performed with this format.   Location of patient: home Location of provider: [facility] Others present for call: none  1.  Situational anxiety Patient is scheduled for an MRI tomorrow for ongoing right-sided low back and hip pain radiating down to the right lower extremity.  He notes his pain is not very well controlled despite use of muscle relaxer, pain medication and prednisone.  He has history of back surgery in the past.  He does have quite a bit of anxiety in the MRI tubes and notes that he has problems with being in small spaces.  He was previously prescribed anxiolytic in the past for this and wants to know if this is something we can do today.   ROS: Per HPI  Allergies  Allergen Reactions  . Diphenhydramine Hcl Other (See Comments)    Can tolerate   Past Medical History:  Diagnosis Date  . COPD (chronic obstructive pulmonary disease) ([place])   . Diabetes mellitus without complication ([place])   . Diverticulitis   . Hydronephrosis of left kidney   . Hyperlipidemia   . Hypertension   . Left ureteral stone    obstructing  . Renal calculus or stone 2015    Current Outpatient Medications:  .  ACCU-CHEK SOFTCLIX LANCETS lancets, CHECK BLOOD SUGAR ONCE DAILY OR AS DIRECTED, Disp: 100 each, Rfl: 2 .  albuterol (PROAIR HFA) 108 (90 Base) MCG/ACT inhaler, USE 2 PUFFS  EVERY 6 HOURS  AS NEEDED FOR WHEEZING, Disp: 8.5 g, Rfl: 5 .  albuterol (PROVENTIL) (2.5 MG/3ML) 0.083% nebulizer solution, Take 3 mLs (2.5 mg total) by nebulization 4 (four) times daily as needed for wheezing or shortness of breath., Disp: 360 mL, Rfl: 1 .  aspirin 81 MG EC tablet, Take 81 mg by mouth daily.  , Disp: , Rfl:  .  atorvastatin (LIPITOR) 40 MG tablet, Take 1 tablet (40 mg total) by mouth daily., Disp: 90 tablet, Rfl: 3 .  blood glucose meter kit and supplies, Dispense based on patient and insurance preference. Use up to four times daily as directed. (FOR ICD-10 E10.9, E11.9)., Disp: 1 each, Rfl: 0 .  Blood Glucose Monitoring Suppl (ACCU-CHEK AVIVA CONNECT) w/Device KIT, 1 kit daily by Does not apply route., Disp: 1 kit, Rfl: 0 .  Cholecalciferol (VITAMIN D) 1000 UNITS capsule, Take 1,000 Units by mouth 2 (two) times daily. , Disp: , Rfl:  .  fluticasone (FLONASE) 50 MCG/ACT nasal spray, USE 1 SPRAY INTO BOTH  NOSTRILS 2 TIMES DAILY AS  NEEDED FOR ALLERGIES OR  RHINITIS., Disp: 48 g, Rfl: 3 .  glucose blood (ACCU-CHEK AVIVA PLUS) test strip, [place] to check blood sugars once daily, Disp: 100 each, Rfl: 2 .  lisinopril (PRINIVIL,ZESTRIL) 20 MG tablet, Take 1 tablet (20 mg total) by mouth daily., Disp: 90 tablet, Rfl: 3 .  metFORMIN (GLUCOPHAGE-XR) 500 MG 24 hr tablet, Take 1 tablet (500 mg total) by mouth 2 (two)  times daily., Disp: 180 tablet, Rfl: 3 .  metoprolol succinate (TOPROL-XL) 50 MG 24 hr tablet, Take 1 tablet (50 mg total) by mouth daily. Take with or immediately following a meal., Disp: 90 tablet, Rfl: 3 .  omeprazole (PRILOSEC) 20 MG capsule, Take 1 capsule (20 mg total) by mouth daily., Disp: 90 capsule, Rfl: 3 .  predniSONE (STERAPRED UNI-PAK 48 TAB) 10 MG (48) TBPK tablet, Take as directed for 12 days, Disp: 48 tablet, Rfl: 0 .  tamsulosin (FLOMAX) 0.4 MG CAPS capsule, TAKE 2 CAPSULES BY MOUTH  DAILY AFTER SUPPER, Disp: 180 capsule, Rfl: 3  Assessment/ Plan: 78 y.o. male    1. Situational anxiety Ativan prescribed for patient.  The national narcotic database was reviewed and there were no red flags.  We discussed the risk of this medication patient understood.  He will follow-up as needed with PCP. - LORazepam (ATIVAN) 0.5 MG tablet; Take 1 tablet 1 hour before MRI.  May repeat 1 time if needed.  Dispense: 2 tablet; Refill: 0  2. Acute right-sided low back pain with right-sided sciatica Plan for MRI tomorrow.  We will add gabapentin given sciatic type pain.  We discussed the possible side effects of this medication and how to titrate up if needed.  He will follow-up with PCP for ongoing needs - gabapentin (NEURONTIN) 100 MG capsule; Take 1-2 capsules (100-200 mg total) by mouth at bedtime. (for nerve pain)  Dispense: 30 capsule; Refill: 0   Start time: 11:02am End time: 11:09am  Total time spent on patient care (including telephone call/ virtual visit): 12 minutes  [place], [place] [phone]

## 2019-12-26 NOTE — Telephone Encounter (Signed)
Double book my 115 and I will call during lunch so I can legally prescribe him something.

## 2019-12-26 NOTE — Telephone Encounter (Signed)
Covering PCP- please advise and send to pools.  

## 2019-12-27 ENCOUNTER — Other Ambulatory Visit: Payer: Self-pay

## 2019-12-27 ENCOUNTER — Encounter: Payer: Self-pay | Admitting: Family Medicine

## 2019-12-27 ENCOUNTER — Ambulatory Visit (HOSPITAL_COMMUNITY)
Admission: RE | Admit: 2019-12-27 | Discharge: 2019-12-27 | Disposition: A | Discharge status: Home / Self Care | Payer: Medicare Other | Source: Ambulatory Visit | Attending: Physician Assistant | Admitting: Physician Assistant

## 2019-12-27 DIAGNOSIS — M5441 Lumbago with sciatica, right side: Secondary | ICD-10-CM | POA: Diagnosis not present

## 2019-12-27 DIAGNOSIS — M5431 Sciatica, right side: Secondary | ICD-10-CM | POA: Insufficient documentation

## 2019-12-27 DIAGNOSIS — R29898 Other symptoms and signs involving the musculoskeletal system: Secondary | ICD-10-CM | POA: Diagnosis not present

## 2019-12-27 DIAGNOSIS — M545 Low back pain: Secondary | ICD-10-CM | POA: Diagnosis not present

## 2019-12-27 IMAGING — MR MR LUMBAR SPINE W/O CM
4 of 5 series · 13 of 48 positions shown · non-contrast
Comparison: Lumbar spine radiographs [DATE]

CLINICAL DATA: Right sciatic nerve pain, acute right-sided low back
pain with right-sided sciatica, right leg weakness. Additional
history provided: Low back pain radiating down right leg for 2
weeks. Prior surgery.

EXAM:
MRI LUMBAR SPINE WITHOUT CONTRAST
TECHNIQUE: Multiplanar, multisequence MR imaging of the lumbar spine was
performed. No intravenous contrast was administered.

[Series 3: T2 · sagittal · 4.0mm · 0.78mm/px · 4 of 15 slices shown (1 of 2)]
[im 1/15]
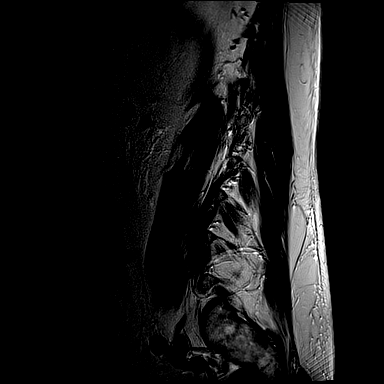
[im 3/15]
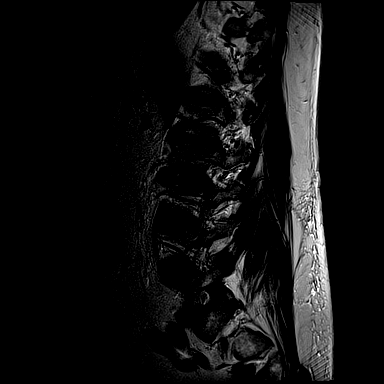
[im 9/15]
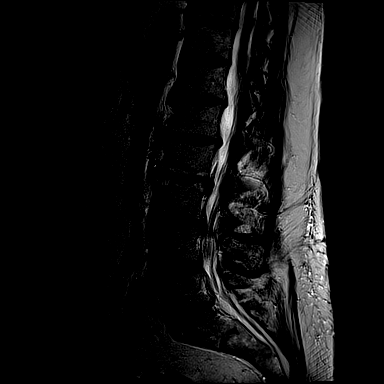
[im 15/15]
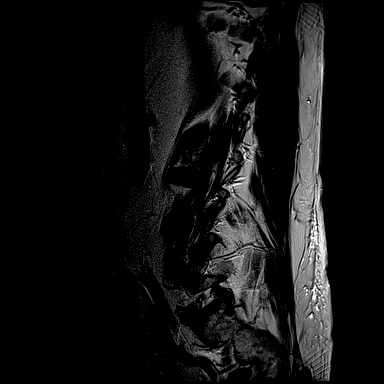

[Series 4: T1 · sagittal · 4.0mm · 0.39mm/px · 3 of 15 slices shown (1 of 2)]
[im 1/15]
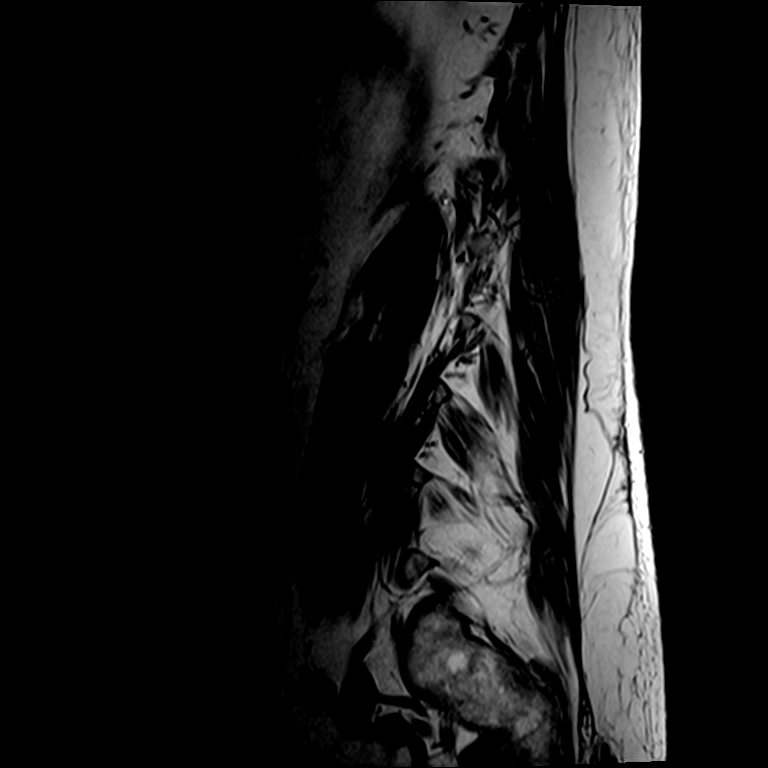
[im 8/15]
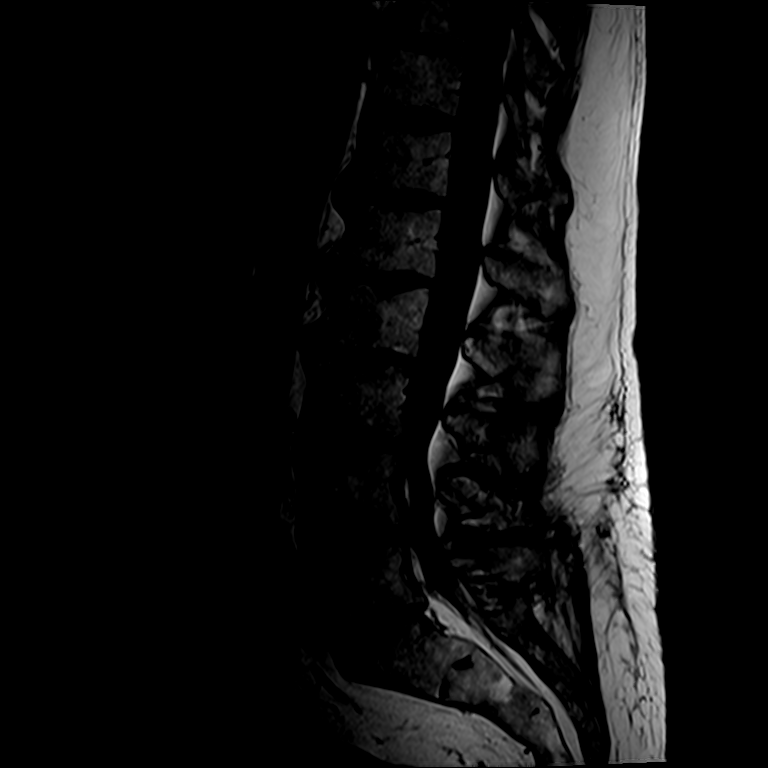
[im 15/15]
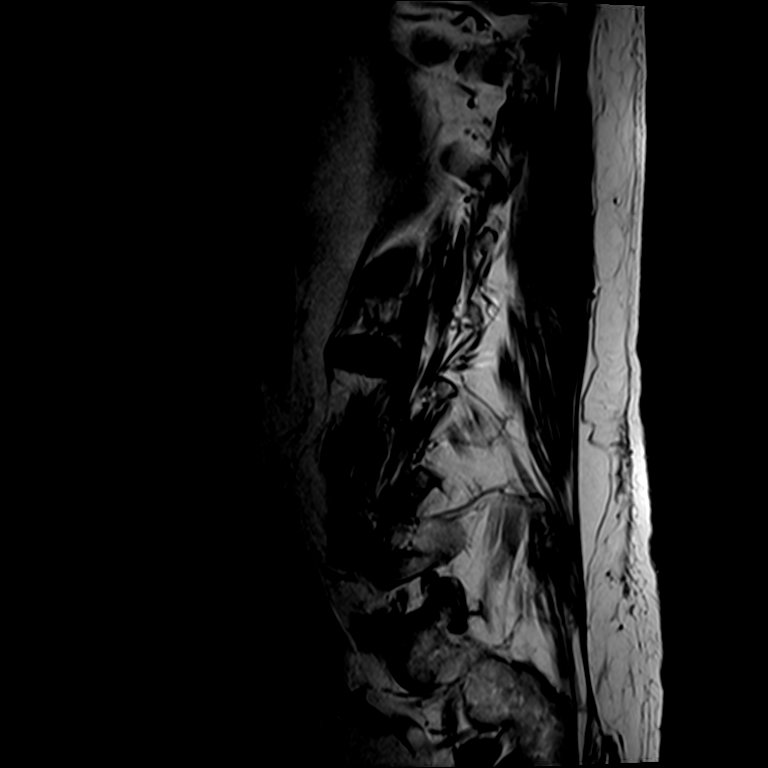

[Series 6: T2 · axial · 4.0mm · 0.38mm/px · z∈[-88,+78]mm · 3 of 44 slices shown (2 of 2)]
[im 6/44]
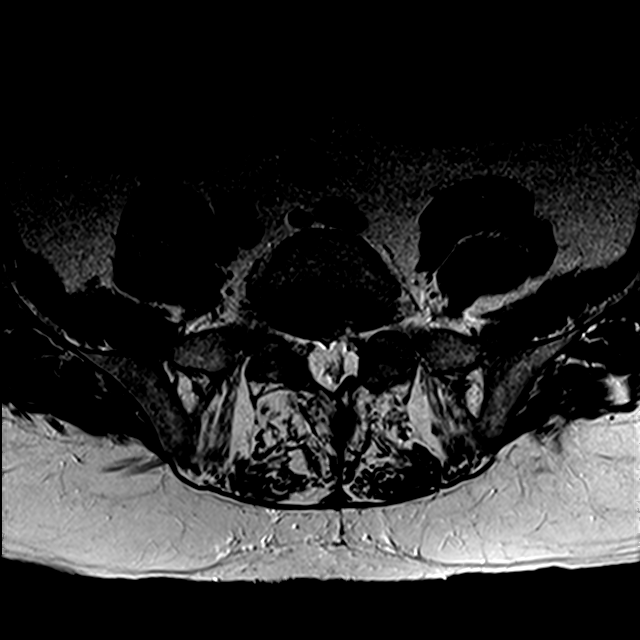
[im 23/44]
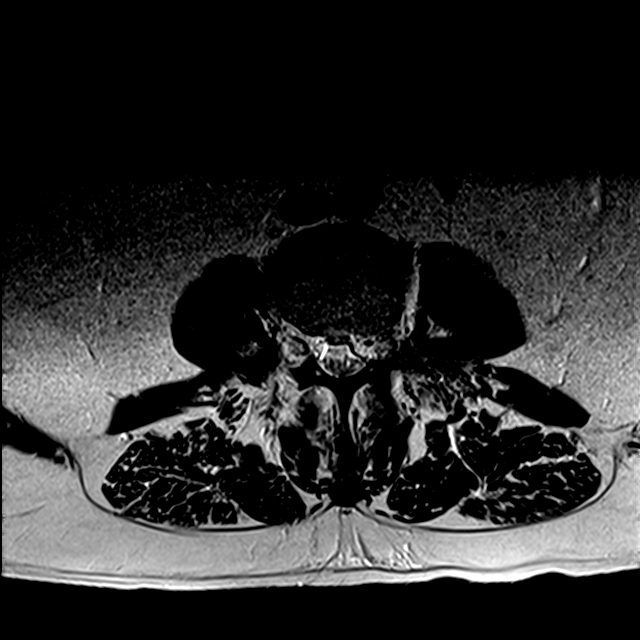
[im 38/44]
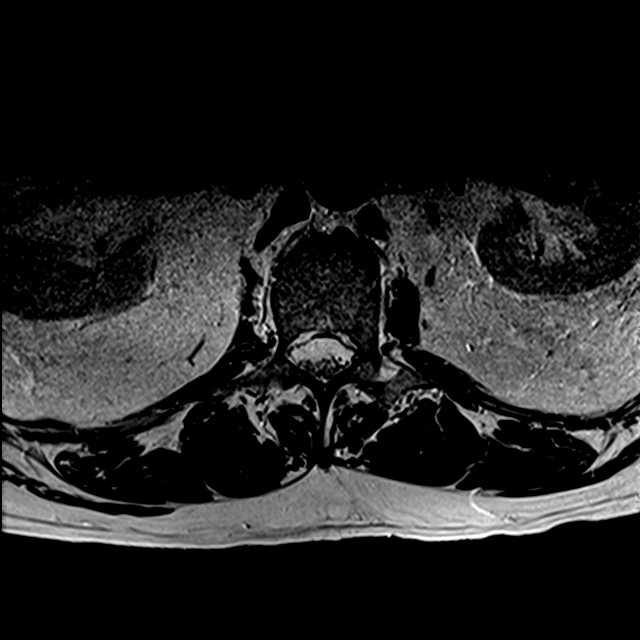

[Series 7: T1 · axial · 4.0mm · 0.38mm/px · z∈[-88,+78]mm · 3 of 44 slices shown (2 of 2)]
[im 6/44]
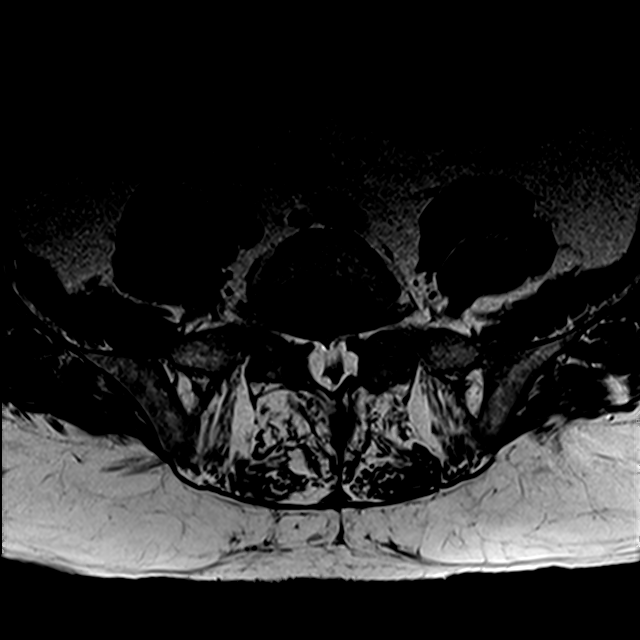
[im 23/44]
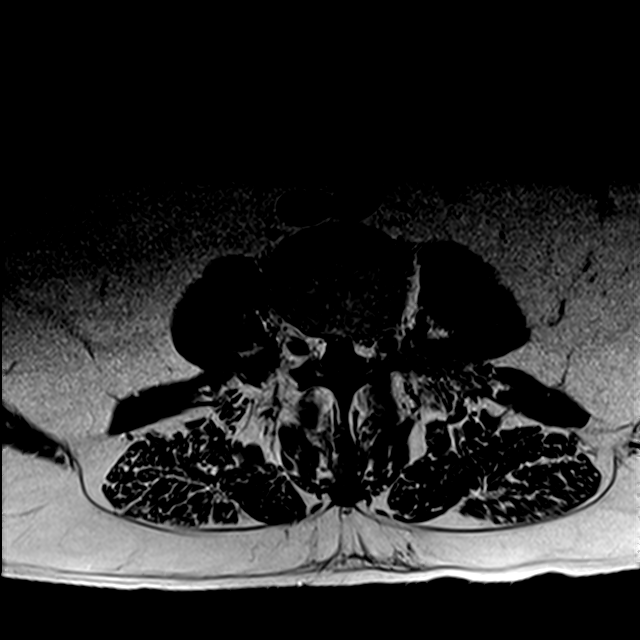
[im 38/44]
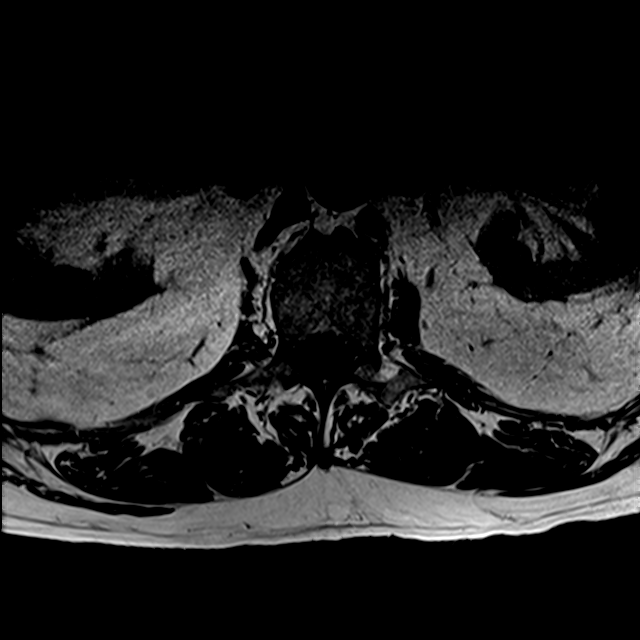

[13 of 48 positions shown; findings below may reference images not displayed]

FINDINGS: Segmentation:  5 lumbar vertebrae.

Alignment: Straightening of the expected lumbar lordosis. Trace
multilevel retrolisthesis.

Vertebrae: Vertebral body height is maintained. No suspicious
osseous lesion. Trace degenerative edema at L2-L3. Multilevel
degenerative endplate irregularity with small Schmorl nodes. L4
vertebral body hemangioma.

Conus medullaris and cauda equina: Conus extends to the L1 level. No
signal abnormality within the visualized distal spinal cord.

Paraspinal and other soft tissues: Incompletely assessed left renal
cyst. Atrophy of the lumbar paraspinal musculature.

Disc levels:

Moderate disc degeneration at the T12-L1 through L2-L3 levels. Mild
disc degeneration at the remaining levels.

T12-L1: Small disc bulge. No significant spinal canal or neural
foraminal narrowing.

L1-L2: Small disc bulge with superimposed small right foraminal disc
protrusion. Associated endplate spurring. Mild effacement of the
ventral thecal sac without significant central canal stenosis. No
significant neural foraminal narrowing.

L2-L3: Disc bulge with endplate spurring. Superimposed small right
subarticular disc extrusion with caudal migration to the upper L3
level. Associated right subarticular stenosis with posterior
displacement of the descending right L3 nerve root (series 6, image
19). Mild facet arthrosis. There is also mild left subarticular and
central canal narrowing. No significant neural foraminal narrowing

L3-L4: Disc bulge with endplate spurring. Superimposed small right
center/subarticular disc extrusion with caudal migration to the
upper L4 level. Sequestered disc material cannot be excluded. Mild
facet arthrosis/ligamentum flavum hypertrophy. Prominence of the
dorsal epidural fat. The disc extrusion contributes to right
subarticular stenosis, encroaching upon the descending right L4
nerve root (series 6, image 26). Moderate central canal narrowing.
No significant neural foraminal narrowing.

L4-L5: Small disc bulge. Superimposed small central disc protrusion
at site of posterior annular fissure. Moderate facet arthrosis with
ligamentum flavum hypertrophy. No significant spinal canal stenosis
or neural foraminal narrowing.

L5-S1: Postoperative changes to the posterior elements. Disc bulge
with endplate spurring. Moderate facet arthrosis. No significant
degenerative spinal canal stenosis. Prominent epidural fat effaces
the thecal sac. Mild/moderate bilateral neural foraminal narrowing
(greater on the left).
IMPRESSION: Lumbar spondylosis as outlined and most notably as follows.

At L2-L3, a small caudally migrated right subarticular disc
extrusion contributes to right subarticular stenosis with posterior
displacement of the descending right L3 nerve root.

At L3-L4, a small caudally migrated right center/subarticular disc
extrusion contributes to right subarticular stenosis, encroaching
upon the descending right L4 nerve root. Sequestered disc material
cannot be excluded. Moderate central canal narrowing also present at
this level.

At L5-S1, multifactorial mild/moderate bilateral neural foraminal
narrowing.

## 2019-12-27 NOTE — Telephone Encounter (Signed)
Patient had appointment for MRI wants a work note faxed for him to go back to work on 12/18/19. Please fax to [phone]. Is patient okay for work note?

## 2019-12-27 NOTE — Telephone Encounter (Signed)
Note faxed.

## 2019-12-27 NOTE — Telephone Encounter (Signed)
Patients wife aware

## 2019-12-27 NOTE — Telephone Encounter (Signed)
Yes, please provide note.

## 2019-12-29 ENCOUNTER — Emergency Department (HOSPITAL_COMMUNITY): Payer: No Typology Code available for payment source

## 2019-12-29 ENCOUNTER — Emergency Department (HOSPITAL_COMMUNITY)
Admission: EM | Admit: 2019-12-29 | Discharge: 2019-12-29 | Disposition: A | Discharge status: Home / Self Care | Payer: No Typology Code available for payment source | Attending: Emergency Medicine | Admitting: Emergency Medicine

## 2019-12-29 ENCOUNTER — Other Ambulatory Visit: Payer: Self-pay

## 2019-12-29 ENCOUNTER — Encounter (HOSPITAL_COMMUNITY): Payer: Self-pay | Admitting: Emergency Medicine

## 2019-12-29 DIAGNOSIS — Y990 Civilian activity done for income or pay: Secondary | ICD-10-CM | POA: Diagnosis not present

## 2019-12-29 DIAGNOSIS — Z87891 Personal history of nicotine dependence: Secondary | ICD-10-CM | POA: Insufficient documentation

## 2019-12-29 DIAGNOSIS — W109XXA Fall (on) (from) unspecified stairs and steps, initial encounter: Secondary | ICD-10-CM | POA: Diagnosis not present

## 2019-12-29 DIAGNOSIS — J449 Chronic obstructive pulmonary disease, unspecified: Secondary | ICD-10-CM | POA: Insufficient documentation

## 2019-12-29 DIAGNOSIS — I10 Essential (primary) hypertension: Secondary | ICD-10-CM | POA: Insufficient documentation

## 2019-12-29 DIAGNOSIS — W19XXXA Unspecified fall, initial encounter: Secondary | ICD-10-CM

## 2019-12-29 DIAGNOSIS — S29001A Unspecified injury of muscle and tendon of front wall of thorax, initial encounter: Secondary | ICD-10-CM | POA: Diagnosis present

## 2019-12-29 DIAGNOSIS — Y9301 Activity, walking, marching and hiking: Secondary | ICD-10-CM | POA: Diagnosis not present

## 2019-12-29 DIAGNOSIS — Y929 Unspecified place or not applicable: Secondary | ICD-10-CM | POA: Diagnosis not present

## 2019-12-29 DIAGNOSIS — Z7984 Long term (current) use of oral hypoglycemic drugs: Secondary | ICD-10-CM | POA: Diagnosis not present

## 2019-12-29 DIAGNOSIS — S20211A Contusion of right front wall of thorax, initial encounter: Secondary | ICD-10-CM

## 2019-12-29 DIAGNOSIS — Z79899 Other long term (current) drug therapy: Secondary | ICD-10-CM | POA: Insufficient documentation

## 2019-12-29 DIAGNOSIS — Z7982 Long term (current) use of aspirin: Secondary | ICD-10-CM | POA: Diagnosis not present

## 2019-12-29 DIAGNOSIS — E119 Type 2 diabetes mellitus without complications: Secondary | ICD-10-CM | POA: Diagnosis not present

## 2019-12-29 IMAGING — DX DG HIP (WITH OR WITHOUT PELVIS) 2-3V*R*
3 series · 3 of 3 positions shown · non-contrast
Comparison: None.

CLINICAL DATA: Pain status post fall.

EXAM:
DG HIP (WITH OR WITHOUT PELVIS) 2-3V RIGHT

[pelvis ap]
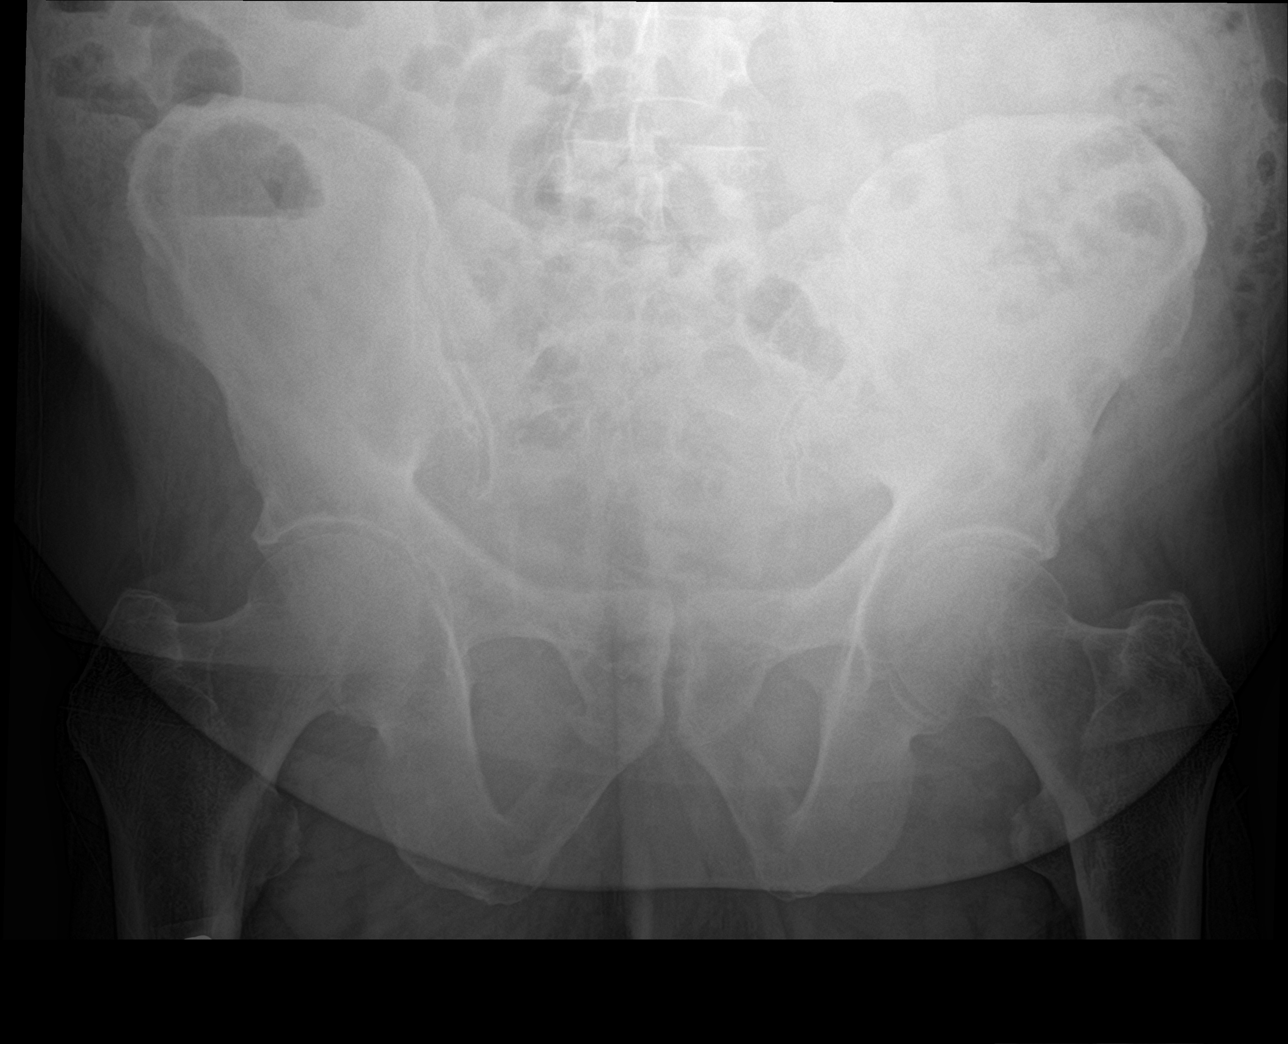

[hip ap]
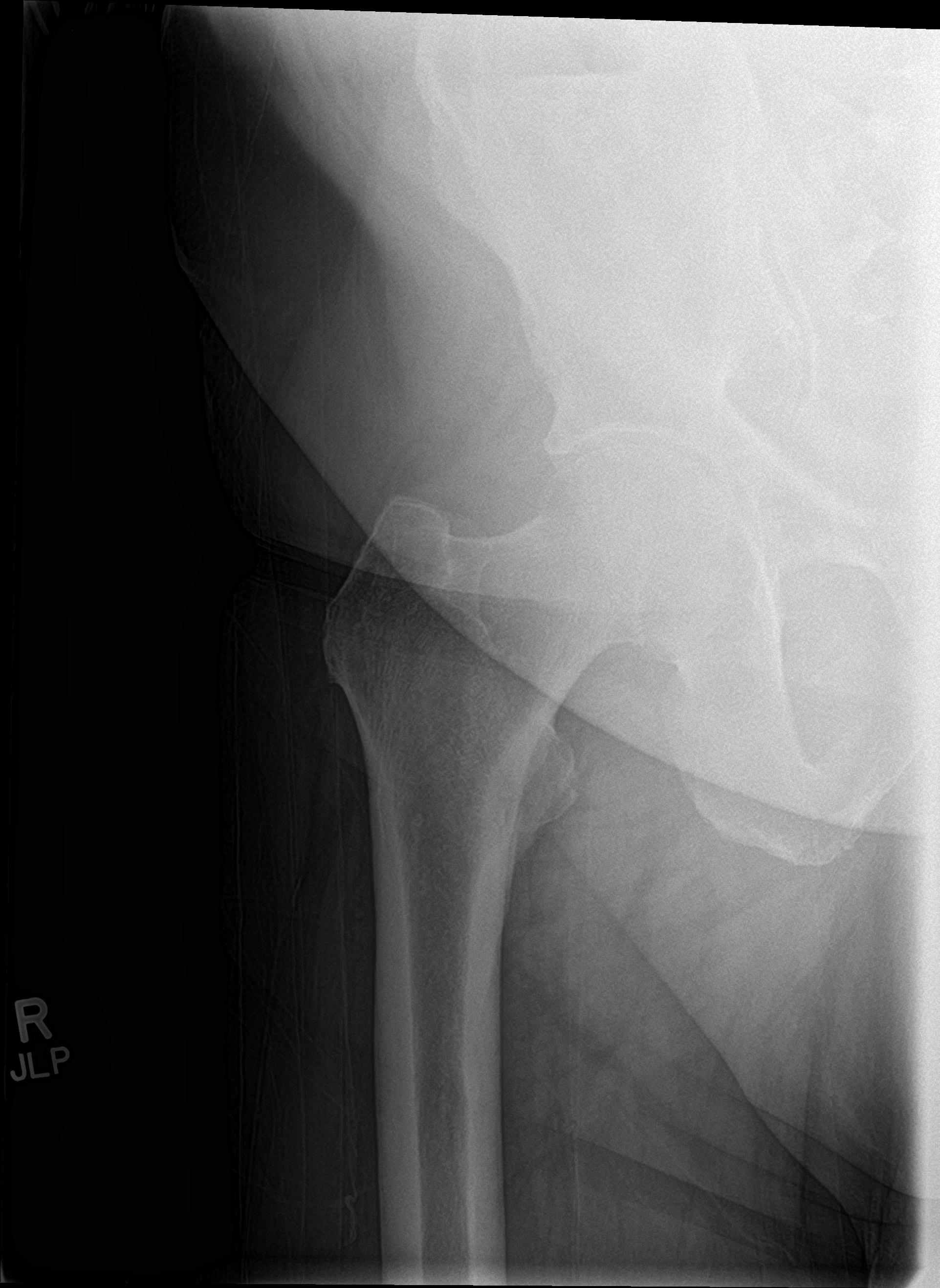

[hip frog leg]
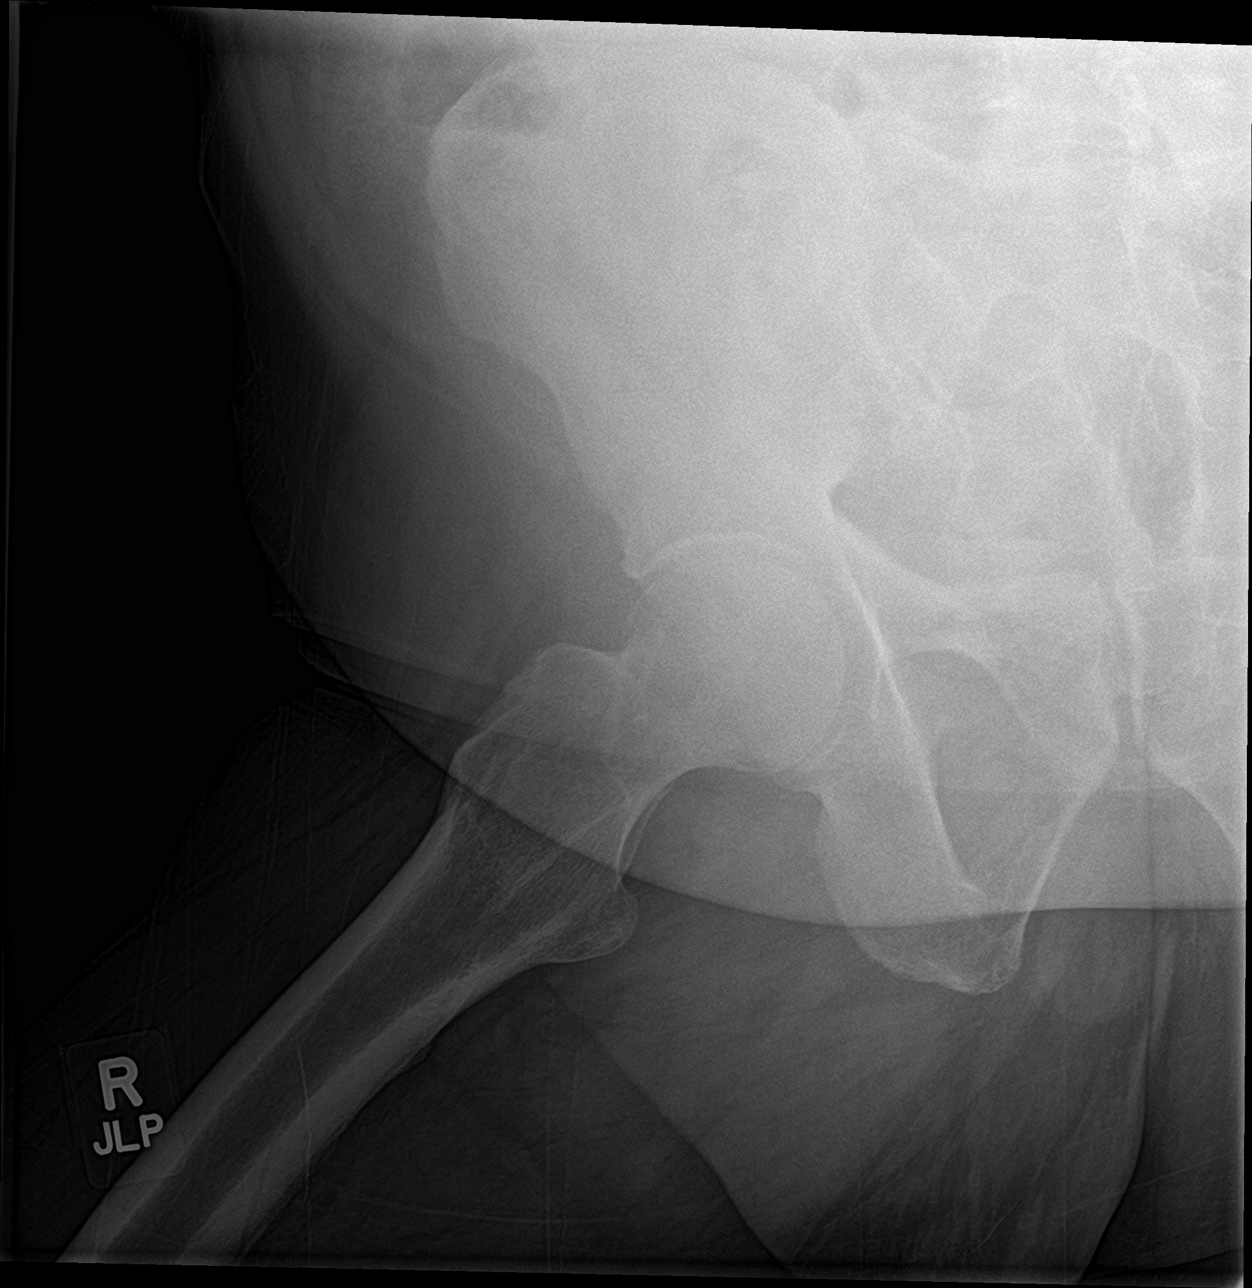

[3 of 3 positions shown; findings below may reference images not displayed]

FINDINGS: There is no acute displaced fracture. No dislocation.
Mild-to-moderate bilateral hip osteoarthritis is noted.
IMPRESSION: 1. No acute displaced fracture. No dislocation.
2. Mild to moderate bilateral hip osteoarthritis.

## 2019-12-29 IMAGING — DX DG LUMBAR SPINE COMPLETE 4+V
5 series · 5 of 5 positions shown · non-contrast
Comparison: [DATE].

CLINICAL DATA: Pain status post fall.

EXAM:
LUMBAR SPINE - COMPLETE 4+ VIEW

[l-spine ap]
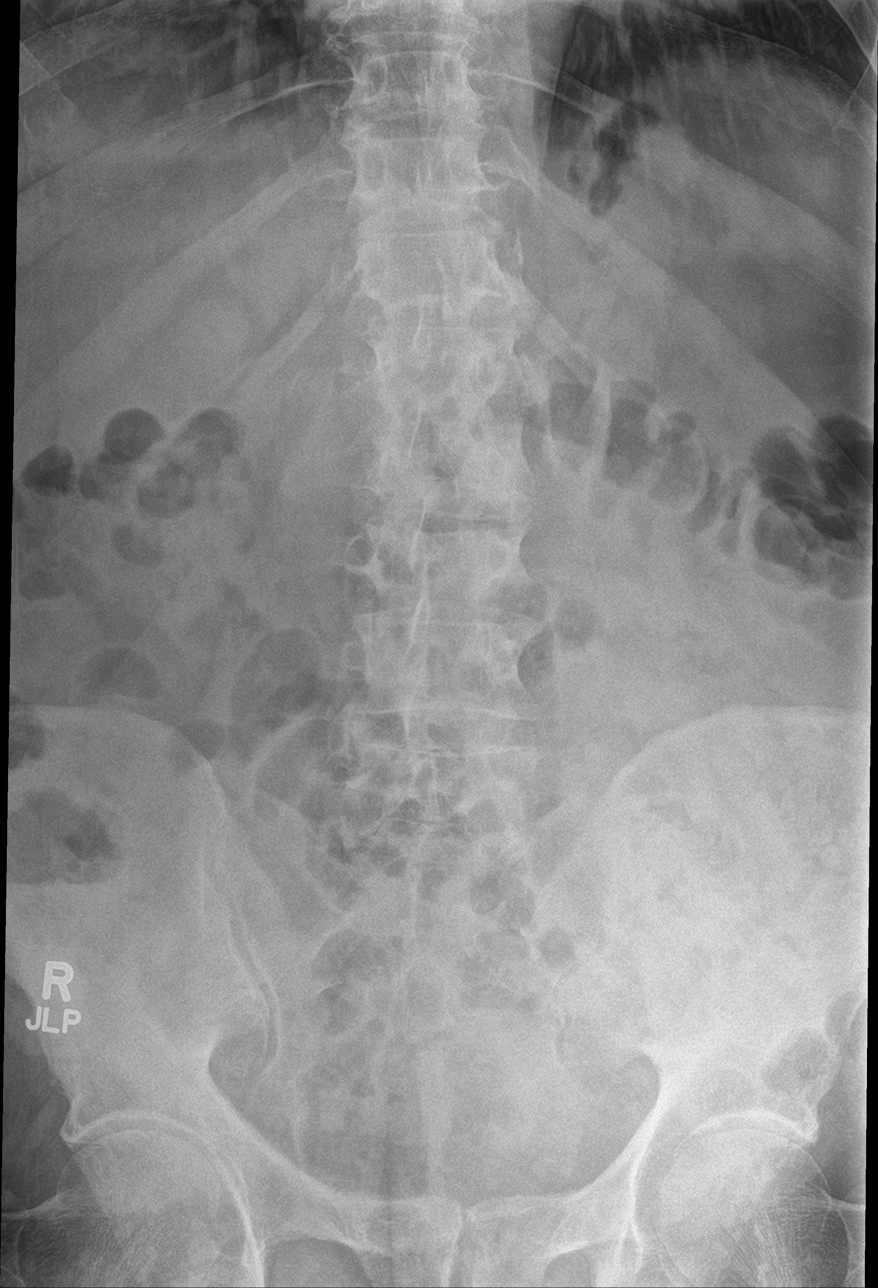

[l-spine obl (1 of 2)]
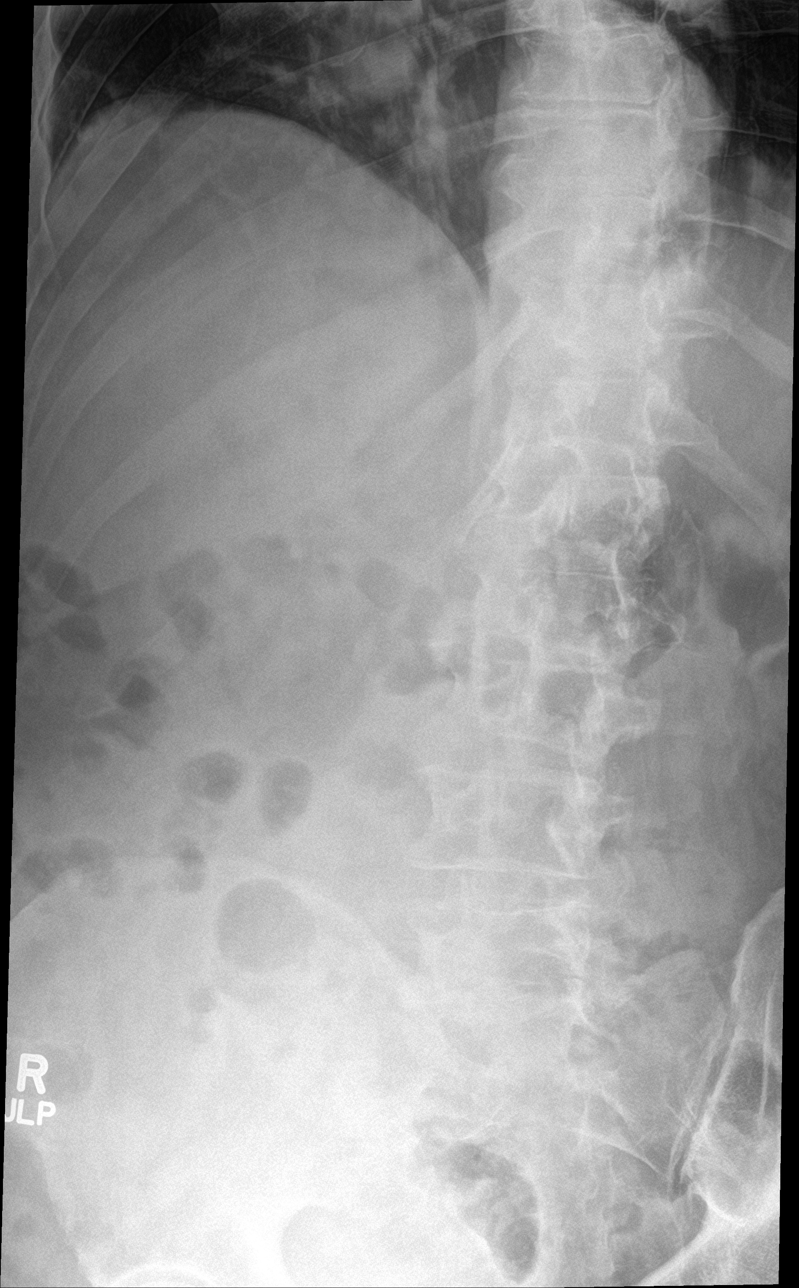

[l-spine obl (2 of 2)]
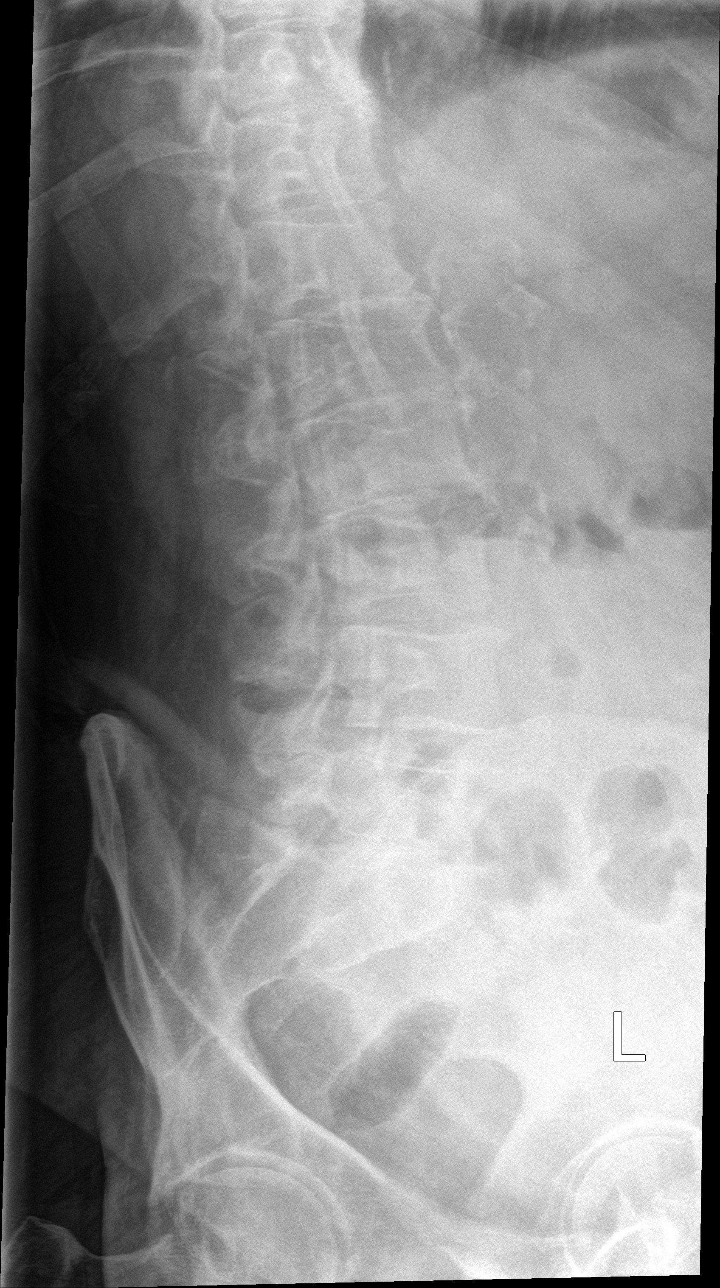

[l-spine lat]
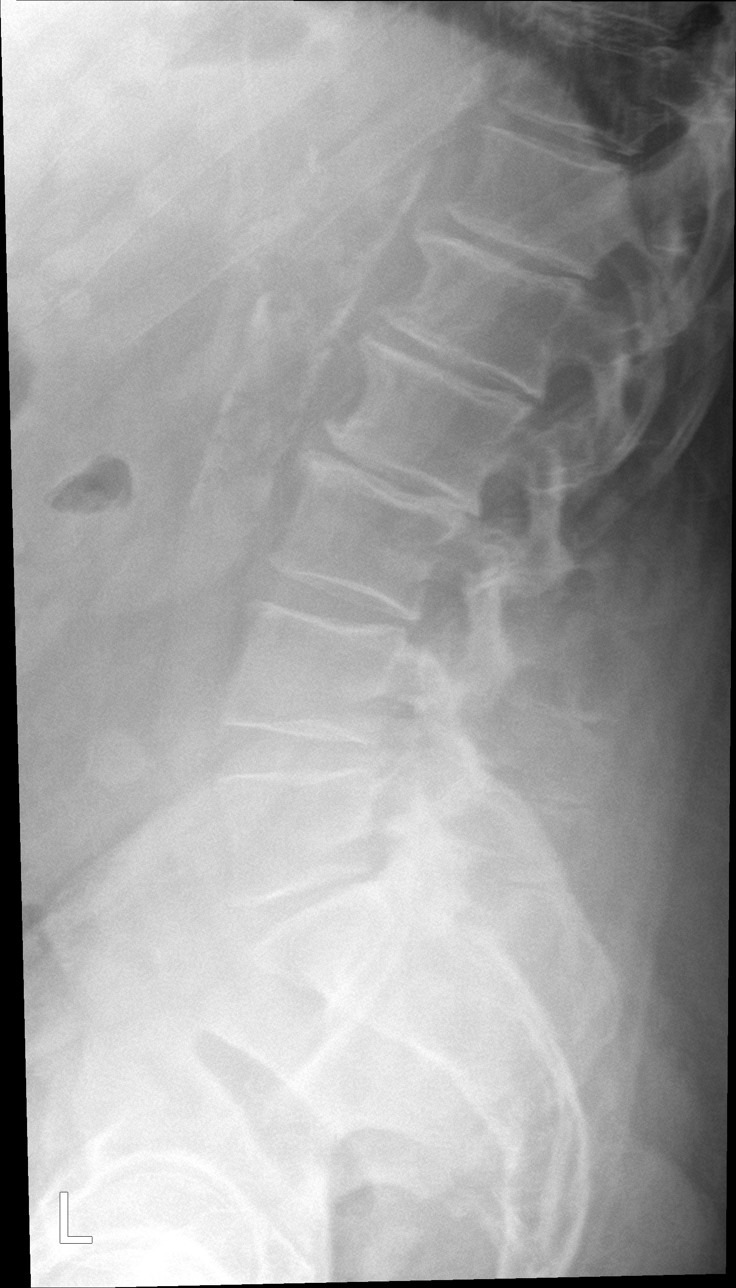

[l-spine spot]
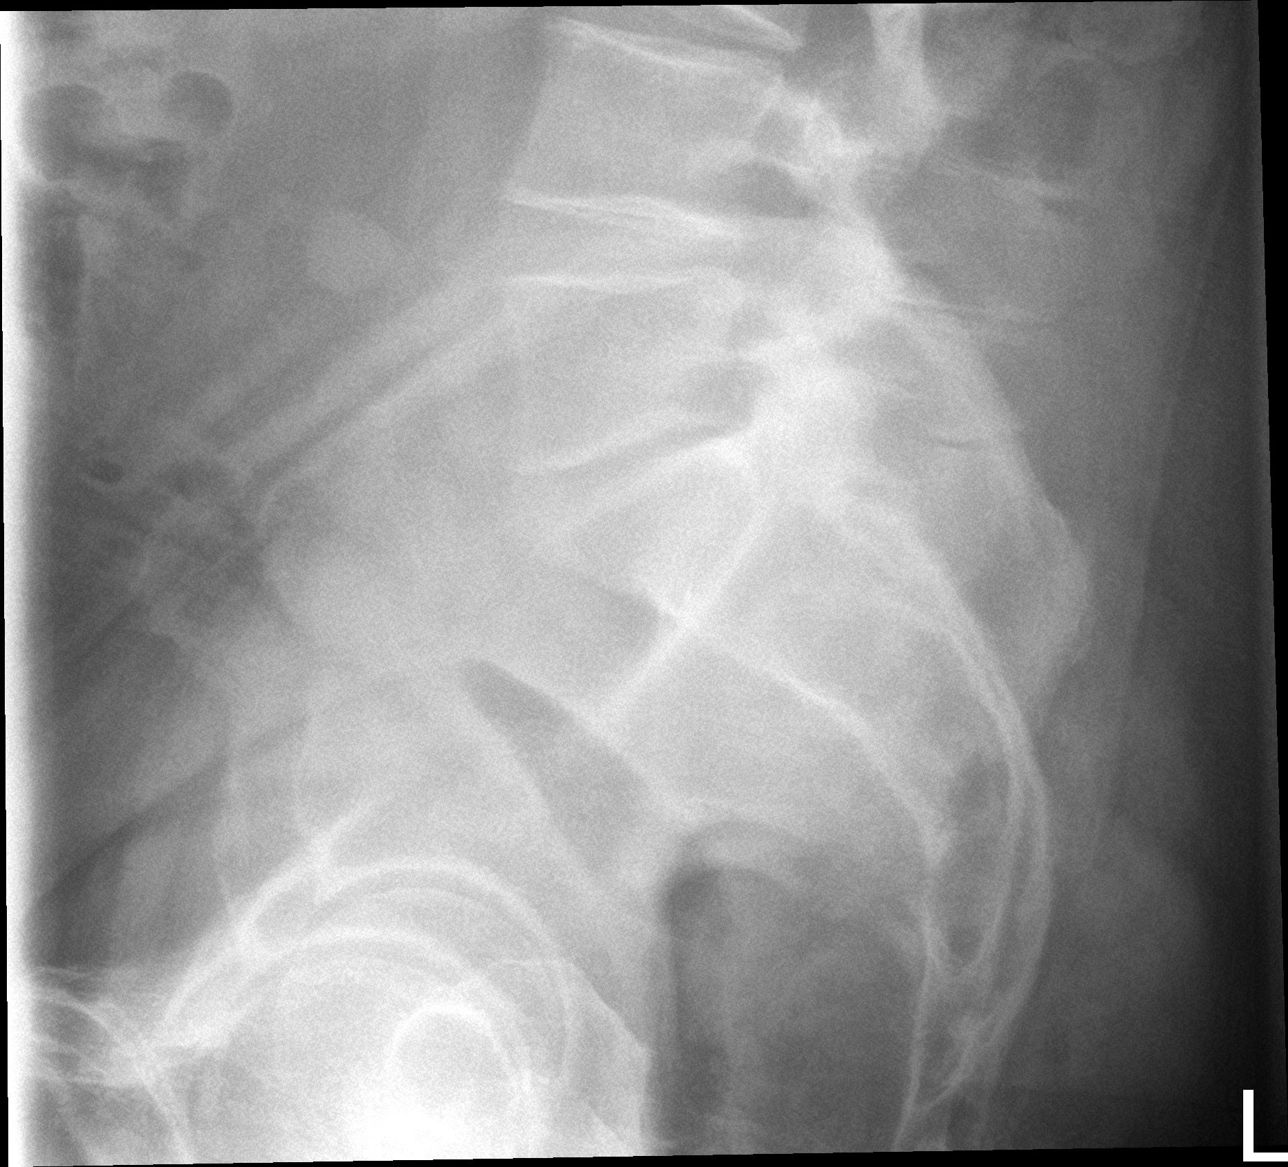

[5 of 5 positions shown; findings below may reference images not displayed]

FINDINGS: Again noted is moderate to severe multilevel disc height loss
throughout the lumbar spine. There is multilevel facet arthrosis,
greatest at the lower lumbar segments. There is no acute osseous
abnormality. The alignment is appropriate. Vascular calcifications
are noted.
IMPRESSION: Stable appearance of the lumbar spine with moderate to severe
multilevel disc height loss and facet arthrosis. No acute osseous
abnormality.

## 2019-12-29 IMAGING — DX DG RIBS W/ CHEST 3+V*R*
4 series · 4 of 4 positions shown · non-contrast
Comparison: [DATE]

CLINICAL DATA: Pain status post fall

EXAM:
RIGHT RIBS AND CHEST - 3+ VIEW

[rib pa (1 of 2)]
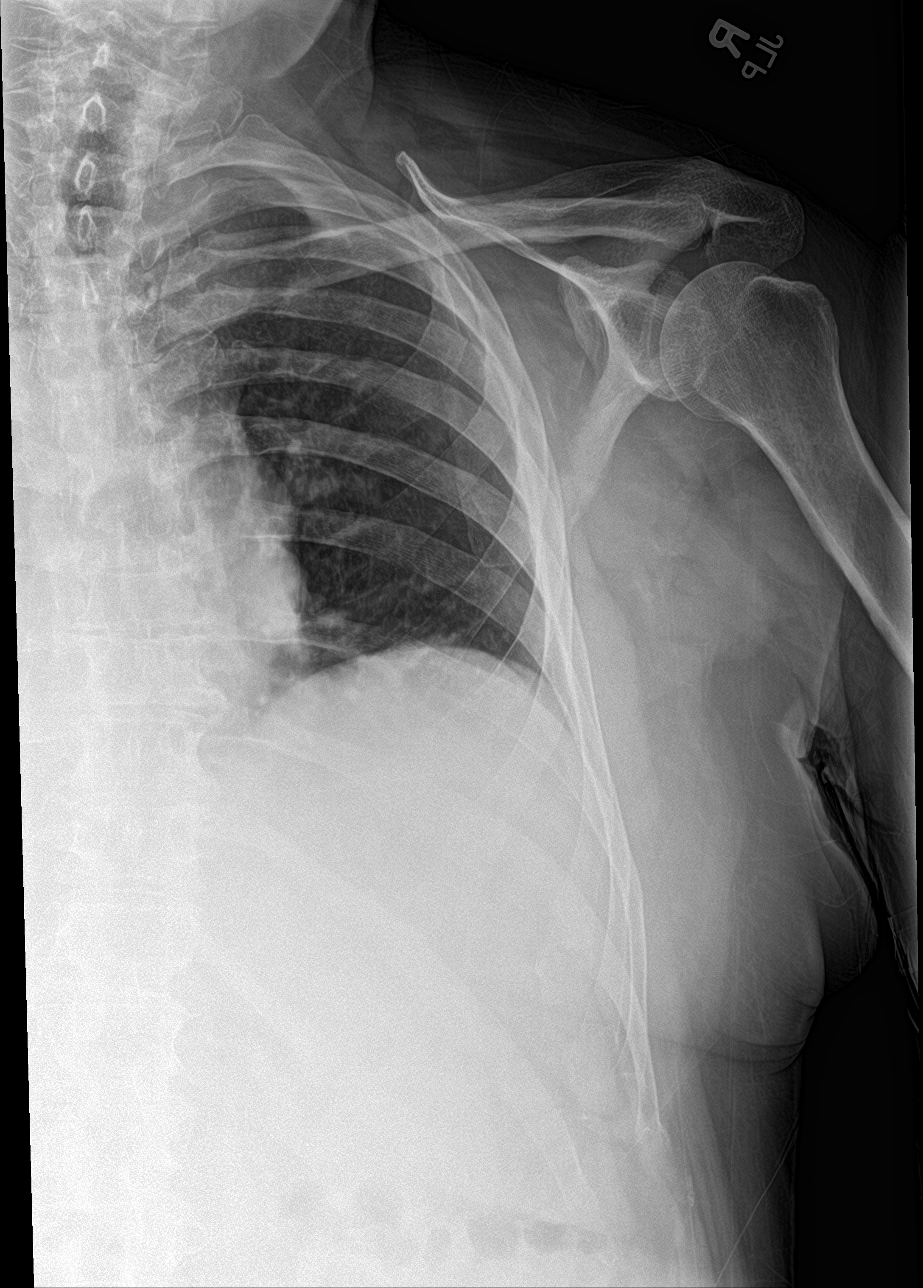

[chest ap]
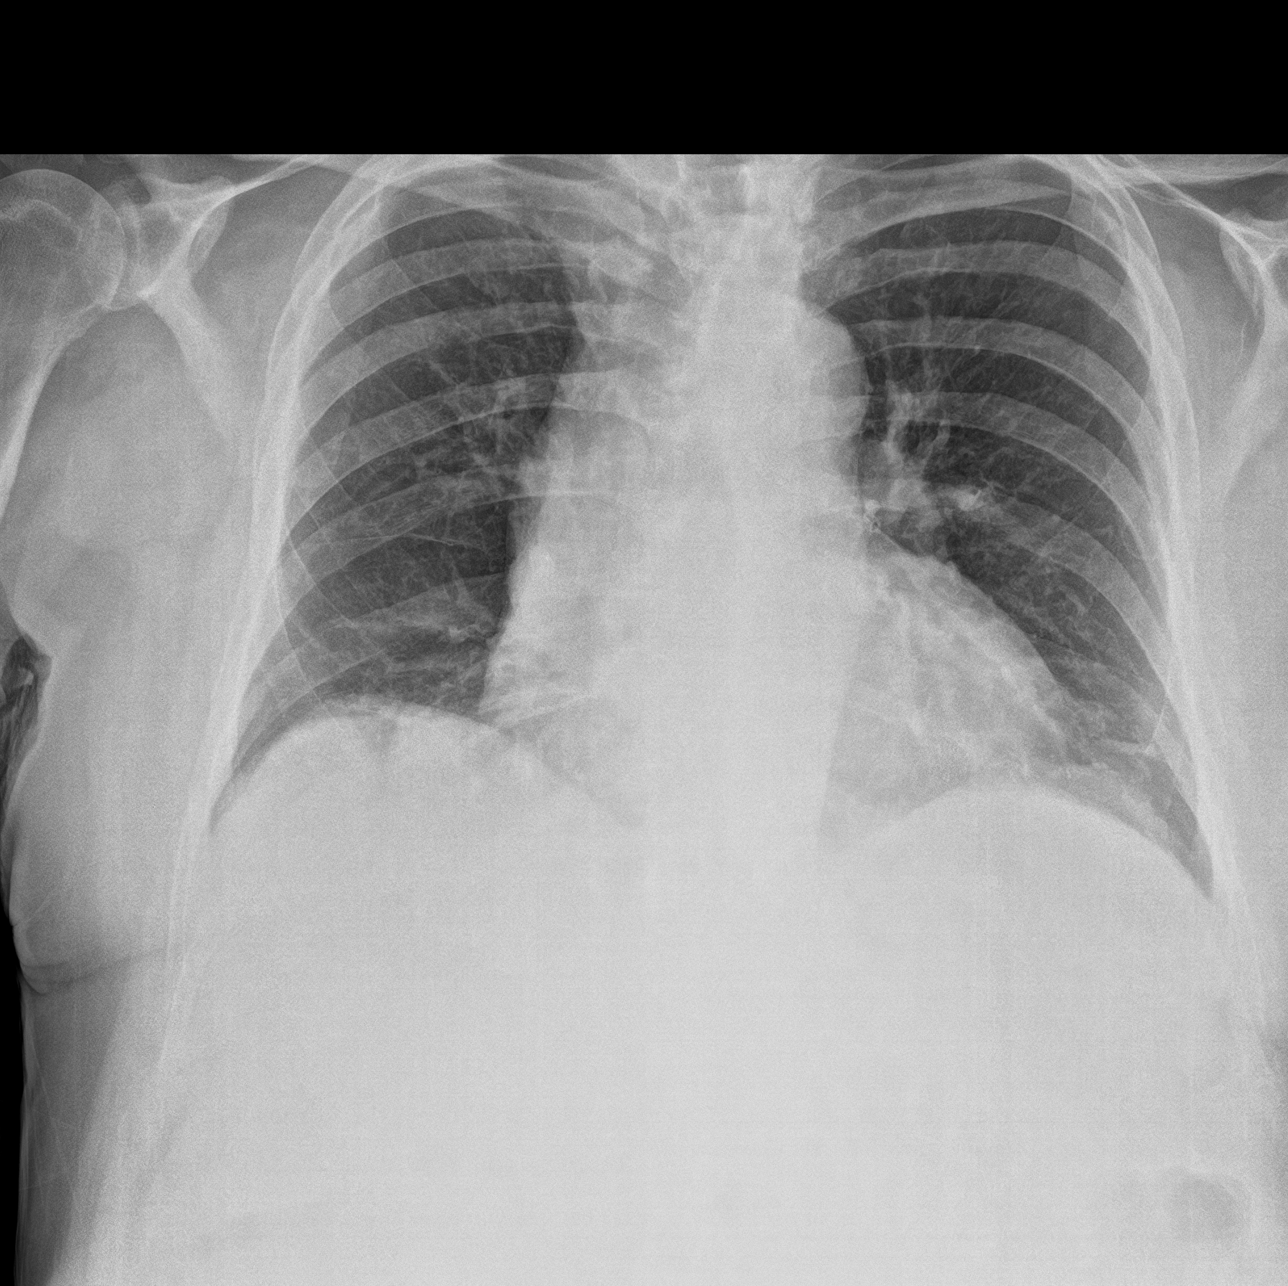

[rib pa (2 of 2)]
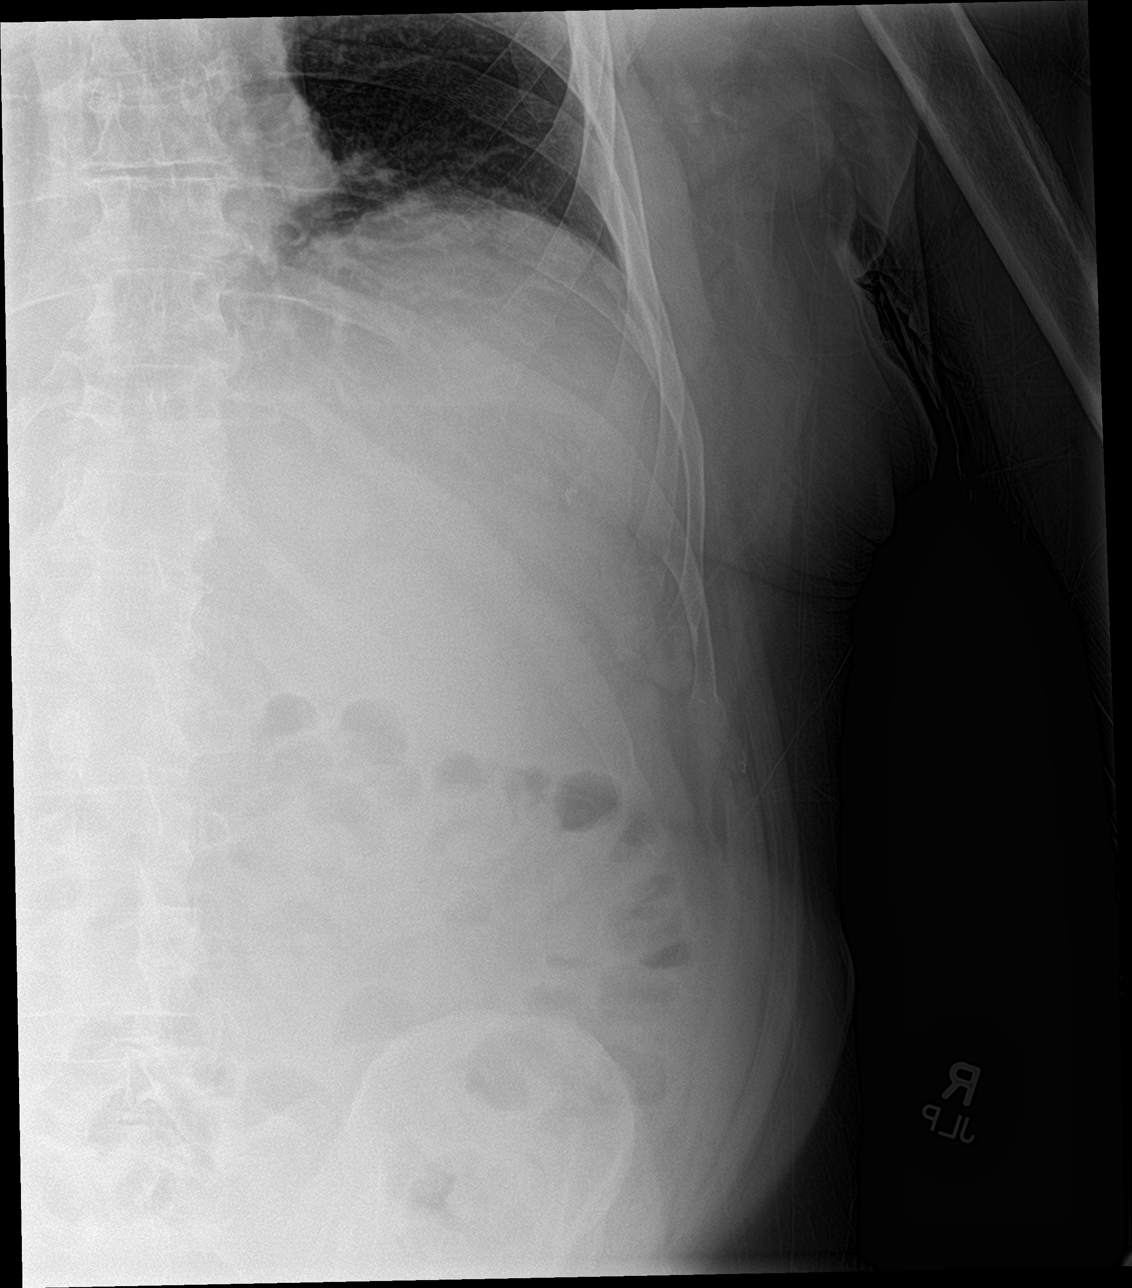

[rib pa obl]
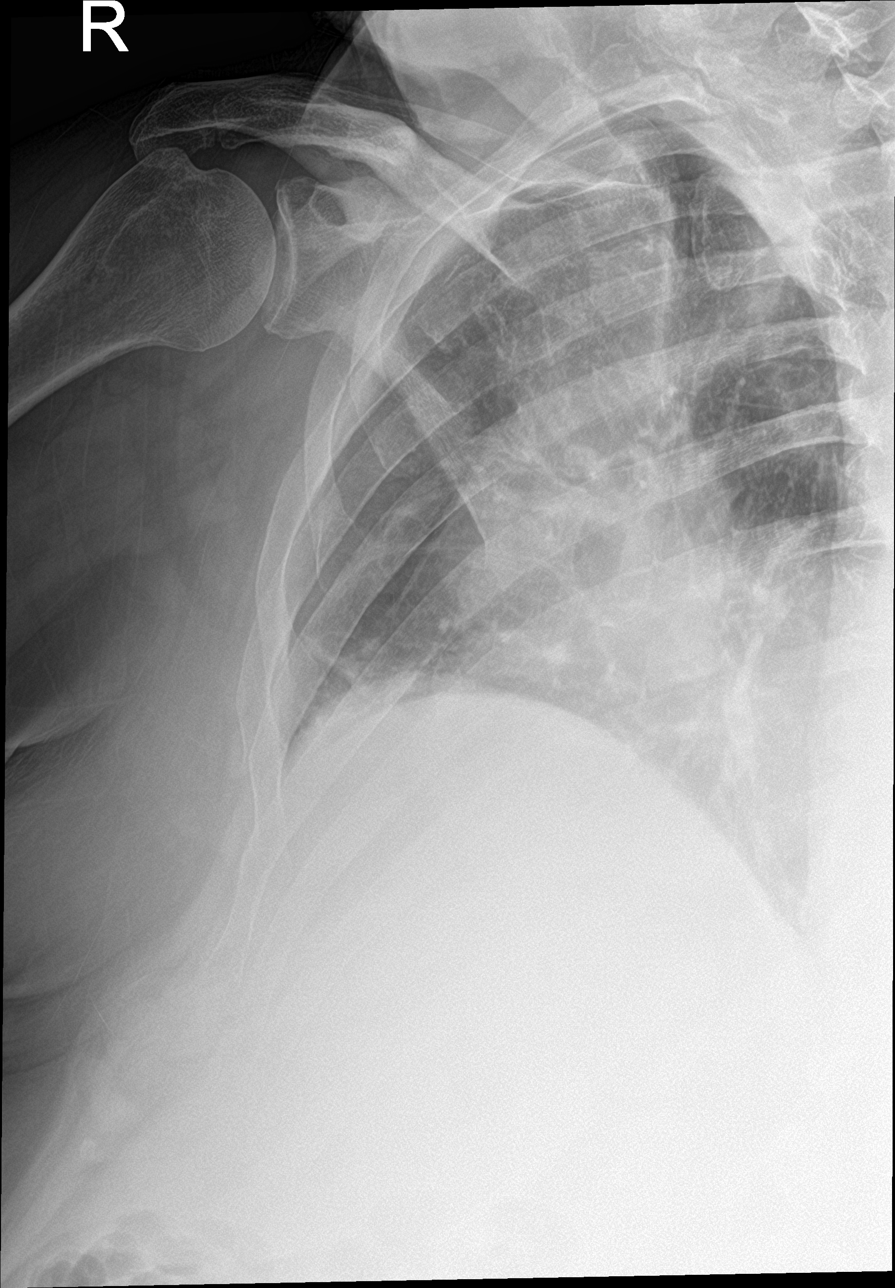

[4 of 4 positions shown; findings below may reference images not displayed]

FINDINGS: There are linear airspace opacities at the lung bases bilaterally
favored to represent atelectasis. There is no pneumothorax. There is
no large pleural effusion. There is no acute displaced rib fracture
identified on this exam. The heart size is mildly enlarged but not
significantly changed from [7L].
IMPRESSION: 1. Bibasilar atelectasis.
2. No acute displaced rib fracture identified. No pneumothorax.
3. No large pleural effusion or pneumothorax.

## 2019-12-29 IMAGING — DX DG SHOULDER 2+V*R*
3 series · 3 of 3 positions shown · non-contrast
Comparison: None.

CLINICAL DATA: Pain status post fall

EXAM:
RIGHT SHOULDER - 2+ VIEW

[shoulder grashey (1 of 2)]
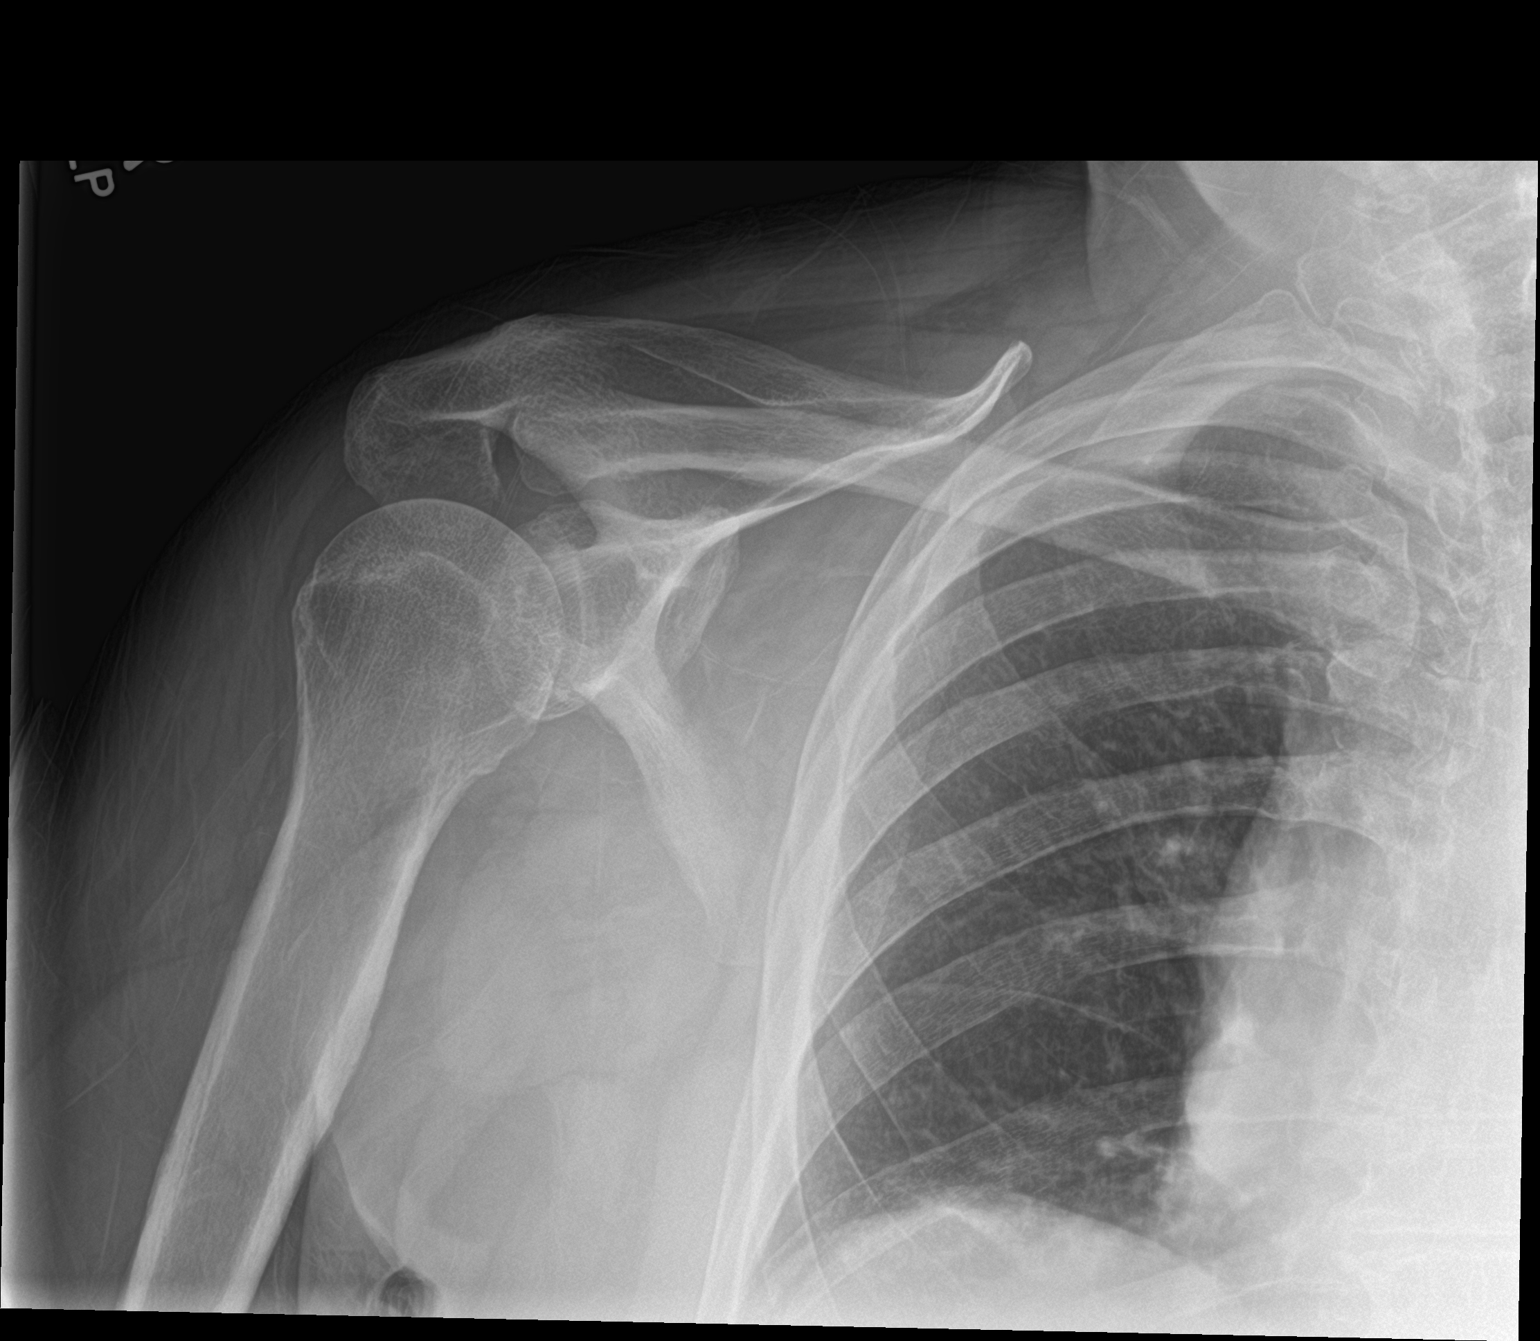

[shoulder grashey (2 of 2)]
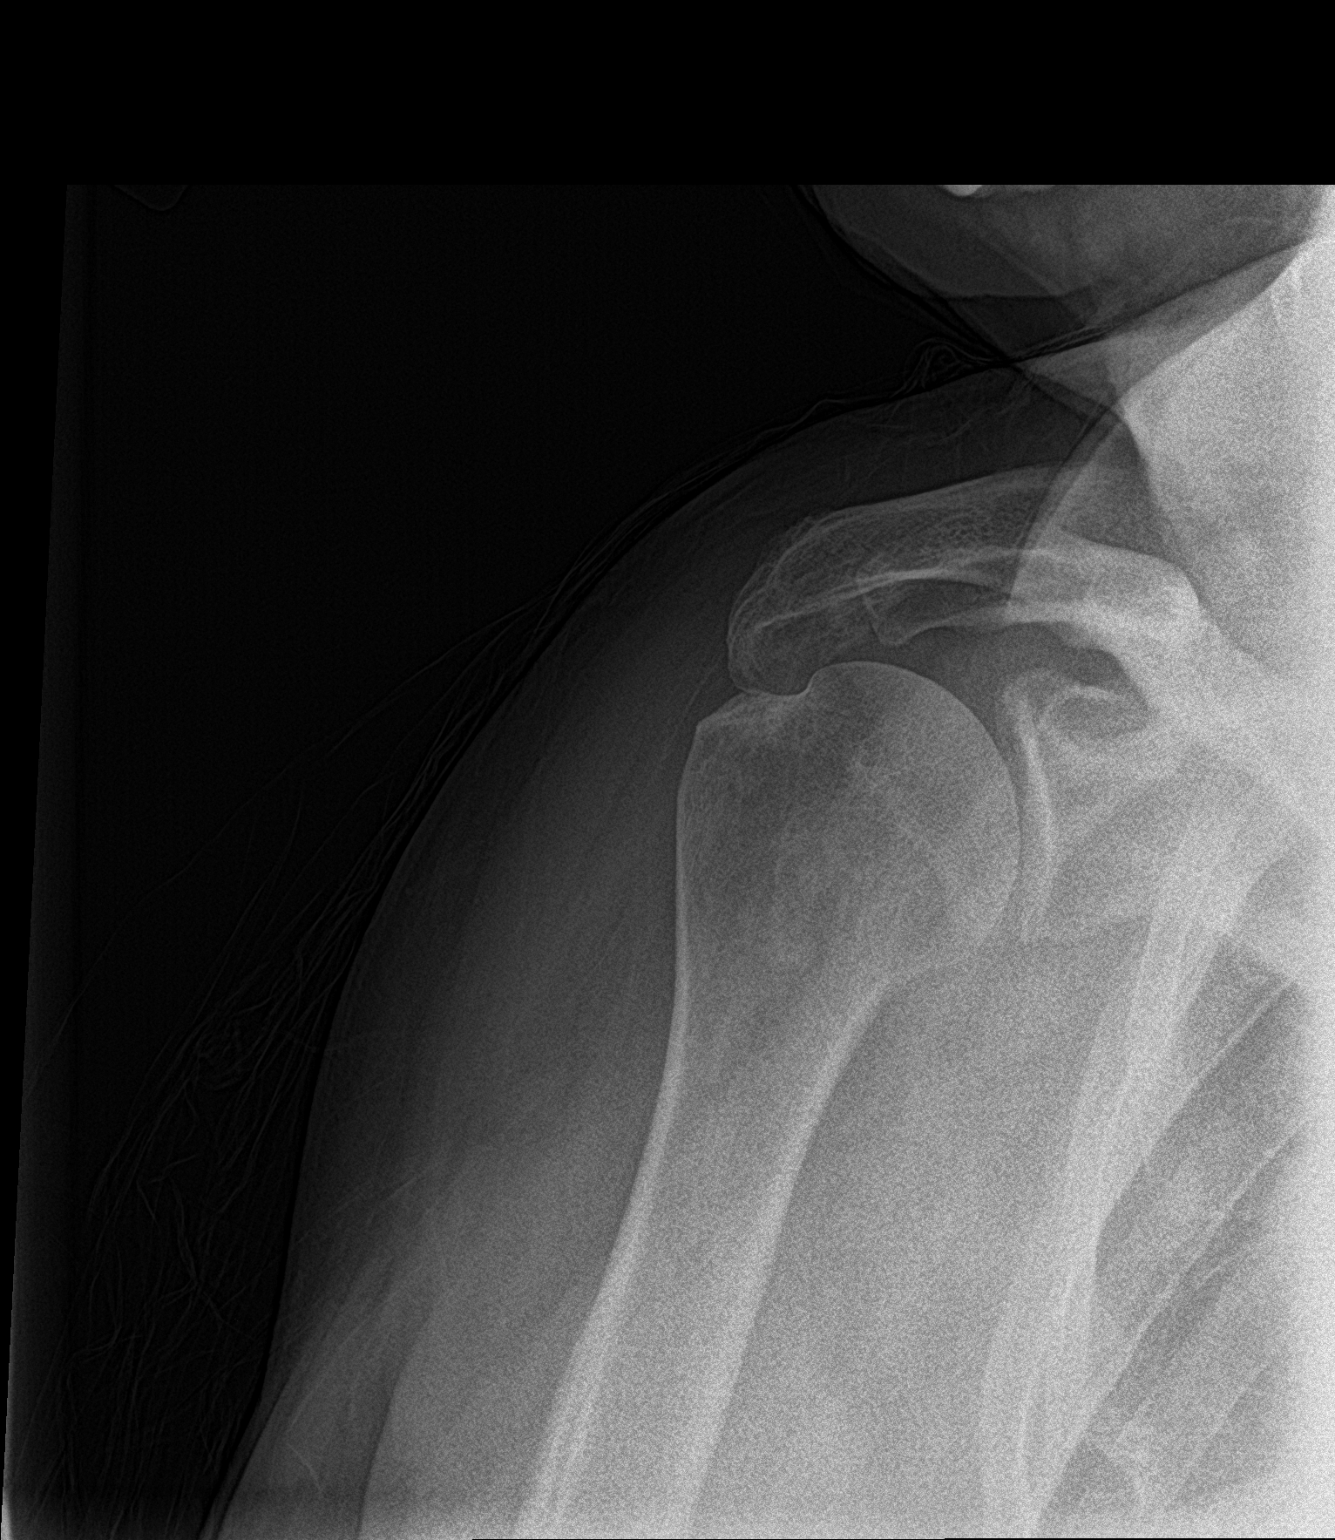

[shoulder y view]
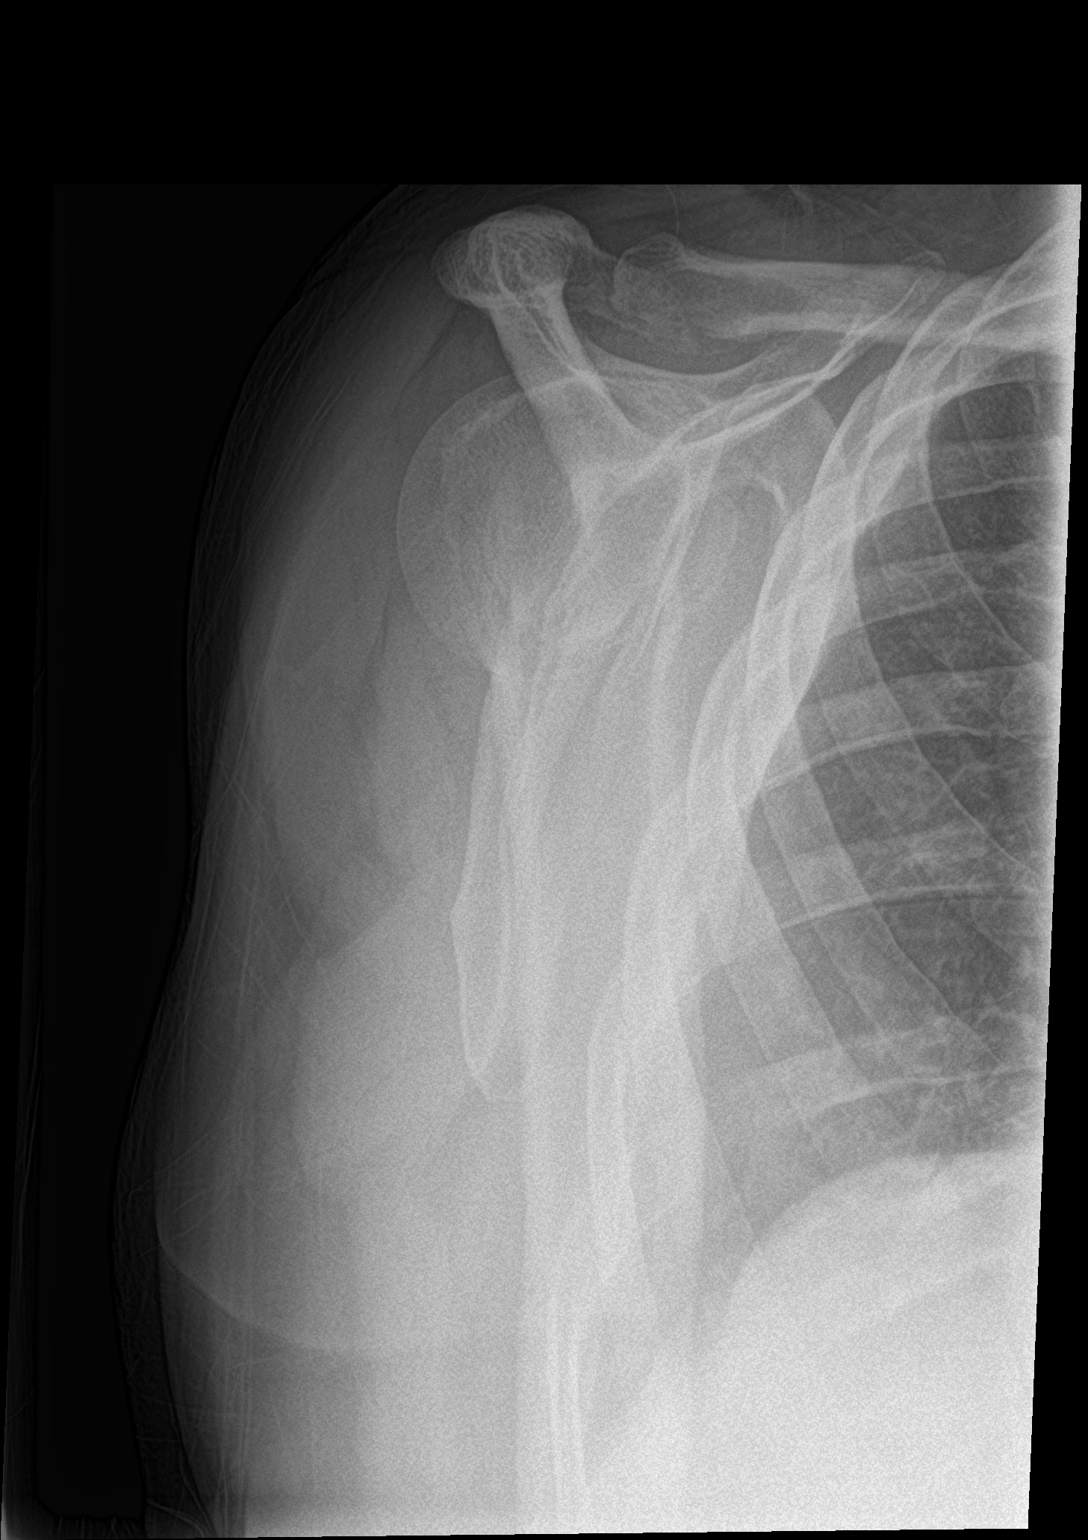

[3 of 3 positions shown; findings below may reference images not displayed]

FINDINGS: There is no evidence of fracture or dislocation. There is no
evidence of arthropathy or other focal bone abnormality. Soft
tissues are unremarkable.
IMPRESSION: Negative.

## 2019-12-29 MED ORDER — LIDOCAINE 5 % EX PTCH
1.0000 | MEDICATED_PATCH | CUTANEOUS | Status: DC
  Administered 2019-12-29: 1 via TRANSDERMAL
  Filled 2019-12-29: qty 1

## 2019-12-29 MED ORDER — HYDROMORPHONE HCL 1 MG/ML IJ SOLN
0.5000 mg | Freq: Once | INTRAMUSCULAR | Status: AC
  Administered 2019-12-29: 17:00:00 0.5 mg via INTRAMUSCULAR
  Filled 2019-12-29: qty 1

## 2019-12-29 MED ORDER — HYDROCODONE-ACETAMINOPHEN 5-325 MG PO TABS: 1.0000 | ORAL_TABLET | ORAL | 0 refills | Status: DC | PRN

## 2019-12-29 MED ORDER — LIDOCAINE 5 % EX PTCH: 1.0000 | MEDICATED_PATCH | CUTANEOUS | 0 refills | Status: DC

## 2019-12-29 NOTE — ED Provider Notes (Signed)
[facility] EMERGENCY DEPARTMENT Provider Note   CSN: [id number] Arrival date & time: 12/29/19  1207     History Chief Complaint  Patient presents with  . Fall    Vincent Collins is a 78 y.o. male.  HPI      NIVAN MELENDREZ is a 78 y.o. male with past medical history of COPD, diabetes, hypertension, and previous lumbar disc surgery.  He presents to the Emergency Department complaining of right rib, back pain and right shoulder pain secondary to mechanical fall that occurred yesterday.  He states that he was at climbing some steps when his right leg "went out."  He describes a fall backwards landing on his right shoulder and side.  He states that he was able to get up unassisted but continues to have pain to his right chest wall.  Pain is worse with certain movements and palpation.  Pain improves at rest.  He denies shortness of breath, hematuria, neck pain.  He does state that he struck his head slightly, but denies LOC, nausea, vomiting, visual changes or headache.  Today, he reports worsening pain along his right chest wall prompting his ER evaluation    Past Medical History:  Diagnosis Date  . COPD (chronic obstructive pulmonary disease) ([place])   . Diabetes mellitus without complication ([place])   . Diverticulitis   . Hydronephrosis of left kidney   . Hyperlipidemia   . Hypertension   . Left ureteral stone    obstructing  . Renal calculus or stone 2015    Patient Active Problem List   Diagnosis Date Noted  . GERD (gastroesophageal reflux disease) 10/07/2017  . Hypertension associated with diabetes ([place]) 04/02/2011  . COPD (chronic obstructive pulmonary disease) ([place]) 04/02/2011  . BPH (benign prostatic hyperplasia) 04/02/2011  . Hyperlipidemia associated with type 2 diabetes mellitus ([place]) 04/02/2011  . Vitamin D deficiency 04/02/2011  . Type 2 diabetes mellitus ([place]) 04/02/2011    Past Surgical History:  Procedure Laterality Date  . 2d echo  04/04/09  . CYSTOSCOPY WITH  RETROGRADE PYELOGRAM, URETEROSCOPY AND STENT PLACEMENT Left 10/11/2014   Procedure: CYSTOSCOPY WITH RETROGRADE PYELOGRAM, DIAGNOSTIC URETEROSCOPY AND STENT PLACEMENT;  Surgeon: Alexis Frock, MD;  Location: [facility];  Service: Urology;  Laterality: Left;  . LUMBAR DISC SURGERY    . SPINE SURGERY         Family History  Problem Relation Age of Onset  . Early death Father        MI age 42  . Heart attack Father   . COPD Brother   . Cancer Sister     Social History   Tobacco Use  . Smoking status: Former Smoker    Quit date: 06/18/1967    Years since quitting: 52.5  . Smokeless tobacco: Former Systems developer    Types: Chew  Substance Use Topics  . Alcohol use: No  . Drug use: No    Home Medications Prior to Admission medications   Medication Sig Start Date End Date Taking? Authorizing Provider  ACCU-CHEK SOFTCLIX LANCETS lancets CHECK BLOOD SUGAR ONCE DAILY OR AS DIRECTED 09/28/17   Evelina Dun A, FNP  albuterol (PROAIR HFA) 108 (90 Base) MCG/ACT inhaler USE 2 PUFFS EVERY 6 HOURS  AS NEEDED FOR WHEEZING 05/25/18   Dettinger, Fransisca Kaufmann, MD  albuterol (PROVENTIL) (2.5 MG/3ML) 0.083% nebulizer solution Take 3 mLs (2.5 mg total) by nebulization 4 (four) times daily as needed for wheezing or shortness of breath. 10/26/19 01/24/20  Dettinger, Fransisca Kaufmann,  MD  aspirin 81 MG EC tablet Take 81 mg by mouth daily.      [provider]  atorvastatin (LIPITOR) 40 MG tablet Take 1 tablet (40 mg total) by mouth daily. 01/04/19   Dettinger, Fransisca Kaufmann, MD  blood glucose meter kit and supplies Dispense based on patient and insurance preference. Use up to four times daily as directed. (FOR ICD-10 E10.9, E11.9). 10/26/19   Dettinger, Fransisca Kaufmann, MD  Blood Glucose Monitoring Suppl (ACCU-CHEK AVIVA CONNECT) w/Device KIT 1 kit daily by Does not apply route. 11/02/17   Dettinger, Fransisca Kaufmann, MD  Cholecalciferol (VITAMIN D) 1000 UNITS capsule Take 1,000 Units by mouth 2 (two) times daily.      [provider]  fluticasone (FLONASE) 50 MCG/ACT nasal spray USE 1 SPRAY INTO BOTH  NOSTRILS 2 TIMES DAILY AS  NEEDED FOR ALLERGIES OR  RHINITIS. 03/04/19   Dettinger, Fransisca Kaufmann, MD  gabapentin (NEURONTIN) 100 MG capsule Take 1-2 capsules (100-200 mg total) by mouth at bedtime. (for nerve pain) 12/26/19   Ronnie Doss M, DO  glucose blood (ACCU-CHEK AVIVA PLUS) test strip [place] to check blood sugars once daily 11/02/17   Dettinger, Fransisca Kaufmann, MD  lisinopril (PRINIVIL,ZESTRIL) 20 MG tablet Take 1 tablet (20 mg total) by mouth daily. 01/04/19   Dettinger, Fransisca Kaufmann, MD  LORazepam (ATIVAN) 0.5 MG tablet Take 1 tablet 1 hour before MRI.  May repeat 1 time if needed. 12/26/19   Janora Norlander, DO  metFORMIN (GLUCOPHAGE-XR) 500 MG 24 hr tablet Take 1 tablet (500 mg total) by mouth 2 (two) times daily. 01/04/19   Dettinger, Fransisca Kaufmann, MD  metoprolol succinate (TOPROL-XL) 50 MG 24 hr tablet Take 1 tablet (50 mg total) by mouth daily. Take with or immediately following a meal. 03/09/19   Dettinger, Fransisca Kaufmann, MD  omeprazole (PRILOSEC) 20 MG capsule Take 1 capsule (20 mg total) by mouth daily. 01/04/19   Dettinger, Fransisca Kaufmann, MD  predniSONE (STERAPRED UNI-PAK 48 TAB) 10 MG (48) TBPK tablet Take as directed for 12 days 12/16/19   Terald Sleeper, PA-C  tamsulosin (FLOMAX) 0.4 MG CAPS capsule TAKE 2 CAPSULES BY MOUTH  DAILY AFTER SUPPER 01/04/19   Dettinger, Fransisca Kaufmann, MD    Allergies    Diphenhydramine hcl  Review of Systems   Review of Systems  Constitutional: Negative for activity change, chills and fever.  Respiratory: Negative for cough, choking, chest tightness and shortness of breath.   Cardiovascular: Positive for chest pain (right chest wall pain).  Gastrointestinal: Negative for abdominal pain, diarrhea, nausea and vomiting.  Genitourinary: Negative for dysuria, flank pain and hematuria.  Musculoskeletal: Positive for arthralgias (right shoulder pain) and back pain. Negative for neck pain.  Skin:  Negative for color change and rash.  Neurological: Negative for dizziness, syncope, weakness, light-headedness, numbness and headaches.    Physical Exam Updated Vital Signs BP (!) 141/67 (BP Location: Right Arm)   Pulse 64   Temp 98.2 F (36.8 C) (Oral)   Resp 18   Ht '5\' 9"'$  (1.753 m)   Wt 110.2 kg   SpO2 96%   BMI 35.88 kg/m   Physical Exam Vitals and nursing note reviewed.  Constitutional:      General: He is not in acute distress.    Appearance: Normal appearance. He is well-developed. He is not toxic-appearing.  HENT:     Head: Atraumatic.  Cardiovascular:     Rate and Rhythm: Normal rate and regular rhythm.  Pulses: Normal pulses.  Pulmonary:     Effort: Pulmonary effort is normal.     Comments: ttp of the lateral and posterior right chest wall.  No bony crepitus.  No ecchymosis.   Chest:     Chest wall: Tenderness present.  Abdominal:     General: There is no distension.     Palpations: Abdomen is soft.     Tenderness: There is no abdominal tenderness. There is no guarding.  Musculoskeletal:        General: Tenderness and signs of injury present. No swelling.     Cervical back: Normal range of motion. No tenderness.     Lumbar back: No swelling, tenderness or bony tenderness.     Comments: ttp with ROM of the right shoulder.  No bony step offs.  C spine non-tender.  Mild ttp of the bilateral lumbar paraspinal muscles.  Hip and knee flexors and extensors intact  Skin:    General: Skin is warm.     Capillary Refill: Capillary refill takes less than 2 seconds.     Findings: No erythema or rash.  Neurological:     General: No focal deficit present.     Mental Status: He is alert.     Sensory: Sensation is intact. No sensory deficit.     Motor: Motor function is intact. No weakness.     Coordination: Coordination is intact.     Gait: Gait is intact.     Comments: CN II-XII intact.  Speech clear.       ED Results / Procedures / Treatments   Labs (all labs  ordered are listed, but only abnormal results are displayed) Labs Reviewed - No data to display  EKG None  Radiology DG Ribs Unilateral W/Chest Right  Result Date: 12/29/2019 CLINICAL DATA:  Pain status post fall EXAM: RIGHT RIBS AND CHEST - 3+ VIEW COMPARISON:  09/27/2015 FINDINGS: There are linear airspace opacities at the lung bases bilaterally favored to represent atelectasis. There is no pneumothorax. There is no large pleural effusion. There is no acute displaced rib fracture identified on this exam. The heart size is mildly enlarged but not significantly changed from 2016. IMPRESSION: 1. Bibasilar atelectasis. 2. No acute displaced rib fracture identified. No pneumothorax. 3. No large pleural effusion or pneumothorax. Electronically Signed   By: Constance Holster M.D.   On: 12/29/2019 17:08   DG Lumbar Spine Complete  Result Date: 12/29/2019 CLINICAL DATA:  Pain status post fall. EXAM: LUMBAR SPINE - COMPLETE 4+ VIEW COMPARISON:  December 16, 2019. FINDINGS: Again noted is moderate to severe multilevel disc height loss throughout the lumbar spine. There is multilevel facet arthrosis, greatest at the lower lumbar segments. There is no acute osseous abnormality. The alignment is appropriate. Vascular calcifications are noted. IMPRESSION: Stable appearance of the lumbar spine with moderate to severe multilevel disc height loss and facet arthrosis. No acute osseous abnormality. Electronically Signed   By: Constance Holster M.D.   On: 12/29/2019 16:55   DG Shoulder Right  Result Date: 12/29/2019 CLINICAL DATA:  Pain status post fall EXAM: RIGHT SHOULDER - 2+ VIEW COMPARISON:  None. FINDINGS: There is no evidence of fracture or dislocation. There is no evidence of arthropathy or other focal bone abnormality. Soft tissues are unremarkable. IMPRESSION: Negative. Electronically Signed   By: Constance Holster M.D.   On: 12/29/2019 17:04   DG Hip Unilat W or Wo Pelvis 2-3 Views Right  Result  Date: 12/29/2019 CLINICAL DATA:  Pain status post  fall. EXAM: DG HIP (WITH OR WITHOUT PELVIS) 2-3V RIGHT COMPARISON:  None. FINDINGS: There is no acute displaced fracture. No dislocation. Mild-to-moderate bilateral hip osteoarthritis is noted. IMPRESSION: 1. No acute displaced fracture. No dislocation. 2. Mild to moderate bilateral hip osteoarthritis. Electronically Signed   By: Constance Holster M.D.   On: 12/29/2019 16:57    Procedures Procedures (including critical care time)  Medications Ordered in ED Medications  HYDROmorphone (DILAUDID) injection 0.5 mg (0.5 mg Intramuscular Given 12/29/19 1650)    ED Course  I have reviewed the triage vital signs and the nursing notes.  Pertinent labs & imaging results that were available during my care of the patient were reviewed by me and considered in my medical decision making (see chart for details).    MDM Rules/Calculators/A&P                      Pt with right rib and shoulder pain secondary to mechanical fall that occurred yesterday.  No significant head injury, neck pain or LOC.  No focal neuro deficits.  Pt does not take blood thinners.  Clinically, exam is concerning for likely rib fx.  Vitals reviewed.  Pt is comfortable at rest, ambulatory with steady gait.    1630  XR's pending.  Discussed with Evalee Jefferson, PA-C who assumes care.  Plan will likely be d/c home with pain medication and close out pt f/u   Final Clinical Impression(s) / ED Diagnoses Final diagnoses:  None    Rx / DC Orders ED Discharge Orders    None       Bufford Lope 12/29/19 2025    Noemi Chapel, MD 12/30/19 [phone]

## 2019-12-29 NOTE — Discharge Instructions (Signed)
You may continue to use the lidoderm patches prescribed if the one we applied seemed to help your pain.  Additionally, you may take the hydrocodone that was sent to your pharmacy.  This will make you drowsy - do not drive within 4 hours of taking this medication.You may continue to use ice or heat, as discussed, heat may help heal your injuries quicker.

## 2019-12-29 NOTE — ED Provider Notes (Signed)
Pt signed out to me by Vincent Parkinson, PA-C pending imaging results. Pt fell from the 3rd to bottom step yesterday, falling backwards, landing on his right shoulder, back and hip with persistent pain with movement.  He denies sob, headache, also no abdominal pain,n/v.  Pain radiates around the right chest wall to anterior ribs with deep inspiration.    Imaging reviewed and interpreted, results below.   Results for orders placed or performed in visit on 01/04/19  Bayer DCA Hb A1c Waived  Result Value Ref Range   HB A1C (BAYER DCA - WAIVED) 6.9 <7.0 %  CMP14+EGFR  Result Value Ref Range   Glucose 144 (H) 65 - 99 mg/dL   BUN 19 8 - 27 mg/dL   Creatinine, Ser 1.04 0.76 - 1.27 mg/dL   GFR calc non Af Amer 69 >59 mL/min/1.73   GFR calc Af Amer 80 >59 mL/min/1.73   BUN/Creatinine Ratio 18 10 - 24   Sodium 141 134 - 144 mmol/L   Potassium 4.3 3.5 - 5.2 mmol/L   Chloride 100 96 - 106 mmol/L   CO2 23 20 - 29 mmol/L   Calcium 7.9 (L) 8.6 - 10.2 mg/dL   Total Protein 6.2 6.0 - 8.5 g/dL   Albumin 4.1 3.5 - 4.8 g/dL   Globulin, Total 2.1 1.5 - 4.5 g/dL   Albumin/Globulin Ratio 2.0 1.2 - 2.2   Bilirubin Total 1.0 0.0 - 1.2 mg/dL   Alkaline Phosphatase 108 39 - 117 IU/L   AST 17 0 - 40 IU/L   ALT 15 0 - 44 IU/L   DG Ribs Unilateral W/Chest Right  Result Date: 12/29/2019 CLINICAL DATA:  Pain status post fall EXAM: RIGHT RIBS AND CHEST - 3+ VIEW COMPARISON:  09/27/2015 FINDINGS: There are linear airspace opacities at the lung bases bilaterally favored to represent atelectasis. There is no pneumothorax. There is no large pleural effusion. There is no acute displaced rib fracture identified on this exam. The heart size is mildly enlarged but not significantly changed from 2016. IMPRESSION: 1. Bibasilar atelectasis. 2. No acute displaced rib fracture identified. No pneumothorax. 3. No large pleural effusion or pneumothorax. Electronically Signed   By: Constance Holster M.D.   On: 12/29/2019 17:08   DG  Lumbar Spine 2-3 Views  Result Date: 12/16/2019 CLINICAL DATA:  Right-sided low back pain with sciatica. EXAM: LUMBAR SPINE - 2-3 VIEW COMPARISON:  None. FINDINGS: No fracture or spondylolisthesis is noted. Severe degenerative disc disease is noted at L1-2 and L2-3 with anterior osteophyte formation. IMPRESSION: Severe multilevel degenerative disc disease. No acute abnormality seen in the lumbar spine. Electronically Signed   By: Marijo Conception M.D.   On: 12/16/2019 14:32   DG Lumbar Spine Complete  Result Date: 12/29/2019 CLINICAL DATA:  Pain status post fall. EXAM: LUMBAR SPINE - COMPLETE 4+ VIEW COMPARISON:  December 16, 2019. FINDINGS: Again noted is moderate to severe multilevel disc height loss throughout the lumbar spine. There is multilevel facet arthrosis, greatest at the lower lumbar segments. There is no acute osseous abnormality. The alignment is appropriate. Vascular calcifications are noted. IMPRESSION: Stable appearance of the lumbar spine with moderate to severe multilevel disc height loss and facet arthrosis. No acute osseous abnormality. Electronically Signed   By: Constance Holster M.D.   On: 12/29/2019 16:55   DG Shoulder Right  Result Date: 12/29/2019 CLINICAL DATA:  Pain status post fall EXAM: RIGHT SHOULDER - 2+ VIEW COMPARISON:  None. FINDINGS: There is no evidence of fracture or  dislocation. There is no evidence of arthropathy or other focal bone abnormality. Soft tissues are unremarkable. IMPRESSION: Negative. Electronically Signed   By: Constance Holster M.D.   On: 12/29/2019 17:04   DG Hip Unilat W or Wo Pelvis 2-3 Views Right  Result Date: 12/29/2019 CLINICAL DATA:  Pain status post fall. EXAM: DG HIP (WITH OR WITHOUT PELVIS) 2-3V RIGHT COMPARISON:  None. FINDINGS: There is no acute displaced fracture. No dislocation. Mild-to-moderate bilateral hip osteoarthritis is noted. IMPRESSION: 1. No acute displaced fracture. No dislocation. 2. Mild to moderate bilateral hip  osteoarthritis. Electronically Signed   By: Constance Holster M.D.   On: 12/29/2019 16:57    Pt has used ice and heat with some relief of sx but has difficulty sleeping as movement worsens pain.  lidoderm patch given with script for same if effective.  Also prescribed hydrocodone if needed for additional pain relief, especially qhs.  Caution re sedation.  Advised f/u with pcp in 1 week if sx not improving.   The patient appears reasonably screened and/or stabilized for discharge and I doubt any other medical condition or other [facility] requiring further screening, evaluation, or treatment in the ED at this time prior to discharge.     Evalee Jefferson, PA-C 12/29/19 1939    Noemi Chapel, MD 12/30/19 [phone]

## 2019-12-29 NOTE — ED Triage Notes (Signed)
Patient complains of lower back pain after right leg gave out yesterday causing him to fall. States pain in back.

## 2020-01-02 ENCOUNTER — Telehealth: Payer: Self-pay | Admitting: Family Medicine

## 2020-01-02 NOTE — Telephone Encounter (Signed)
Pt says he received a missed call from [place]. Says it could be about his MRI he recently had done at [facility] since he hasnt heard anything back. Wants to be called back.

## 2020-01-04 ENCOUNTER — Ambulatory Visit (INDEPENDENT_AMBULATORY_CARE_PROVIDER_SITE_OTHER): Payer: Medicare Other | Admitting: Family Medicine

## 2020-01-04 ENCOUNTER — Other Ambulatory Visit: Payer: Self-pay

## 2020-01-04 ENCOUNTER — Encounter: Payer: Self-pay | Admitting: Family Medicine

## 2020-01-04 ENCOUNTER — Ambulatory Visit (INDEPENDENT_AMBULATORY_CARE_PROVIDER_SITE_OTHER): Payer: Medicare Other

## 2020-01-04 VITALS — BP 134/83 | HR 94 | Temp 97.8°F | Ht 69.0 in | Wt 244.0 lb

## 2020-01-04 DIAGNOSIS — R0781 Pleurodynia: Secondary | ICD-10-CM

## 2020-01-04 DIAGNOSIS — W19XXXA Unspecified fall, initial encounter: Secondary | ICD-10-CM

## 2020-01-04 DIAGNOSIS — Z23 Encounter for immunization: Secondary | ICD-10-CM

## 2020-01-04 DIAGNOSIS — S2241XA Multiple fractures of ribs, right side, initial encounter for closed fracture: Secondary | ICD-10-CM | POA: Diagnosis not present

## 2020-01-04 DIAGNOSIS — S2241XD Multiple fractures of ribs, right side, subsequent encounter for fracture with routine healing: Secondary | ICD-10-CM

## 2020-01-04 IMAGING — DX DG RIBS W/ CHEST 3+V*R*
5 series · 5 of 5 positions shown · non-contrast
Comparison: Radiographs dated [DATE]

CLINICAL DATA: Trauma secondary to a fall.

EXAM:
RIGHT RIBS AND CHEST - 3+ VIEW

[chest pa]
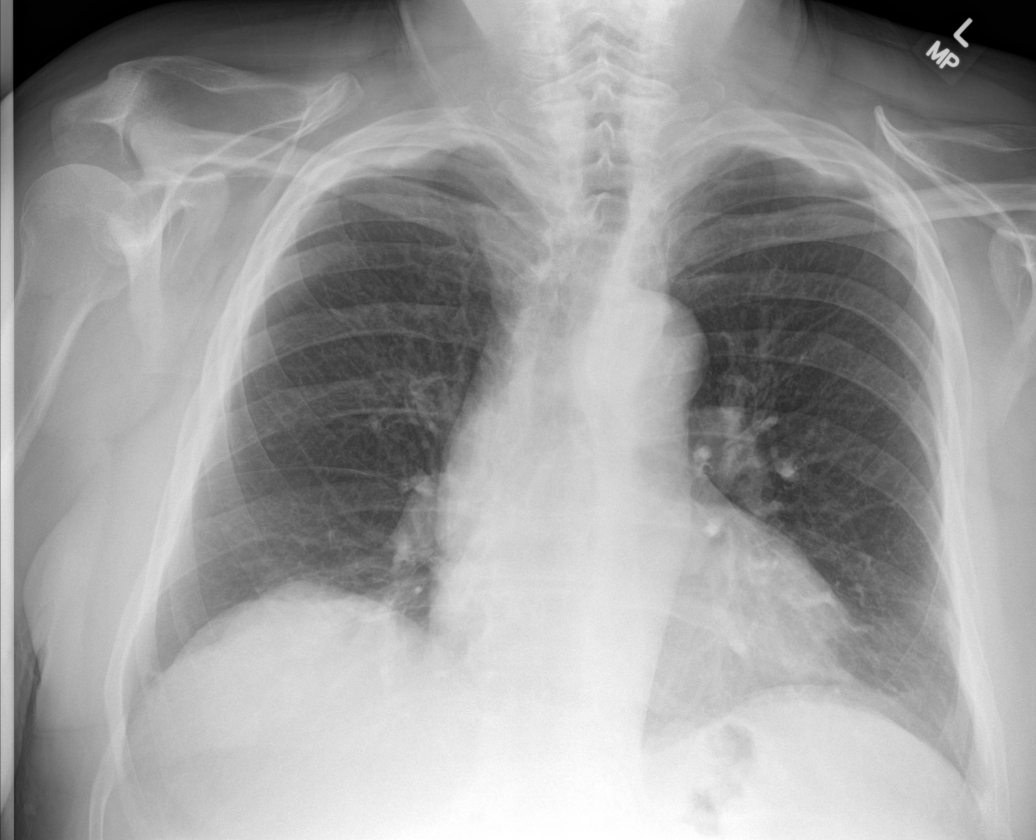

[rib pa (1 of 2)]
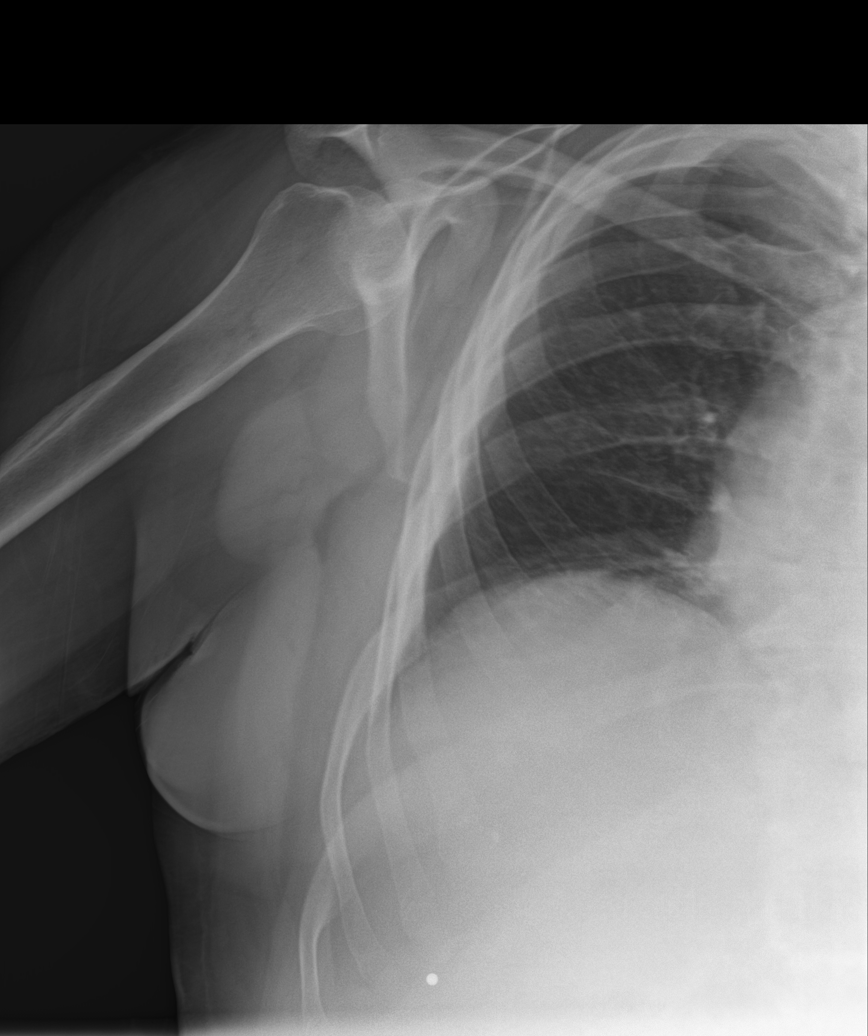

[rib obl (1 of 2)]
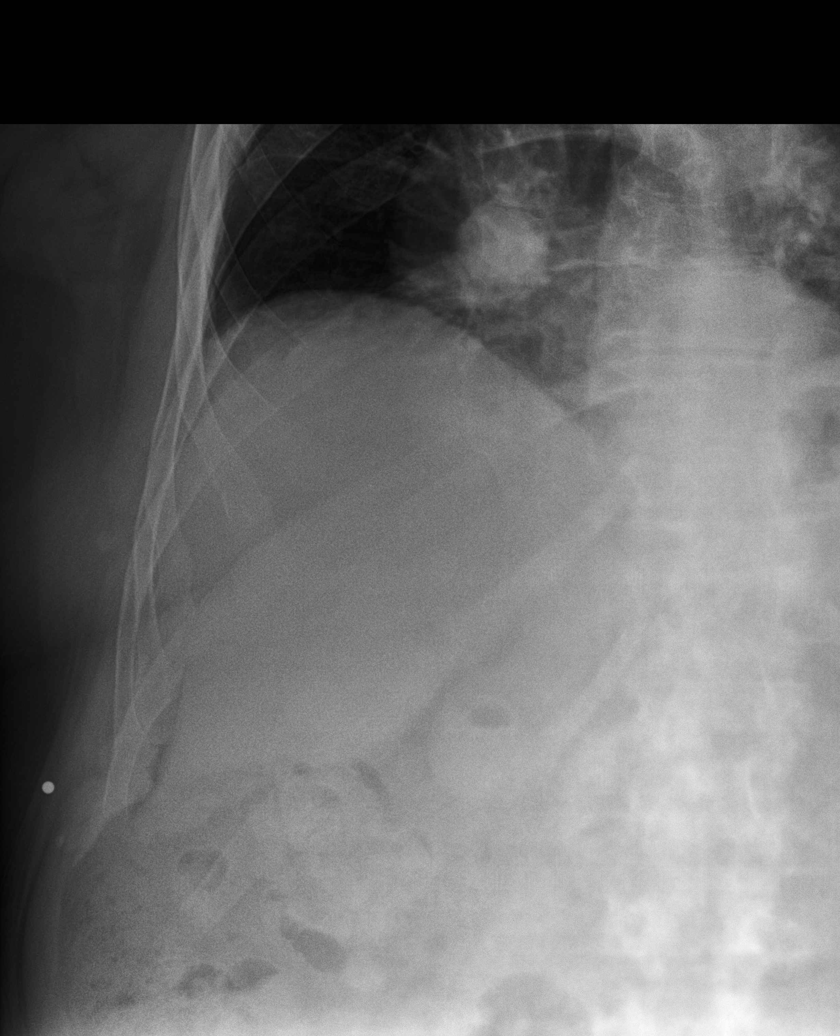

[rib obl (2 of 2)]
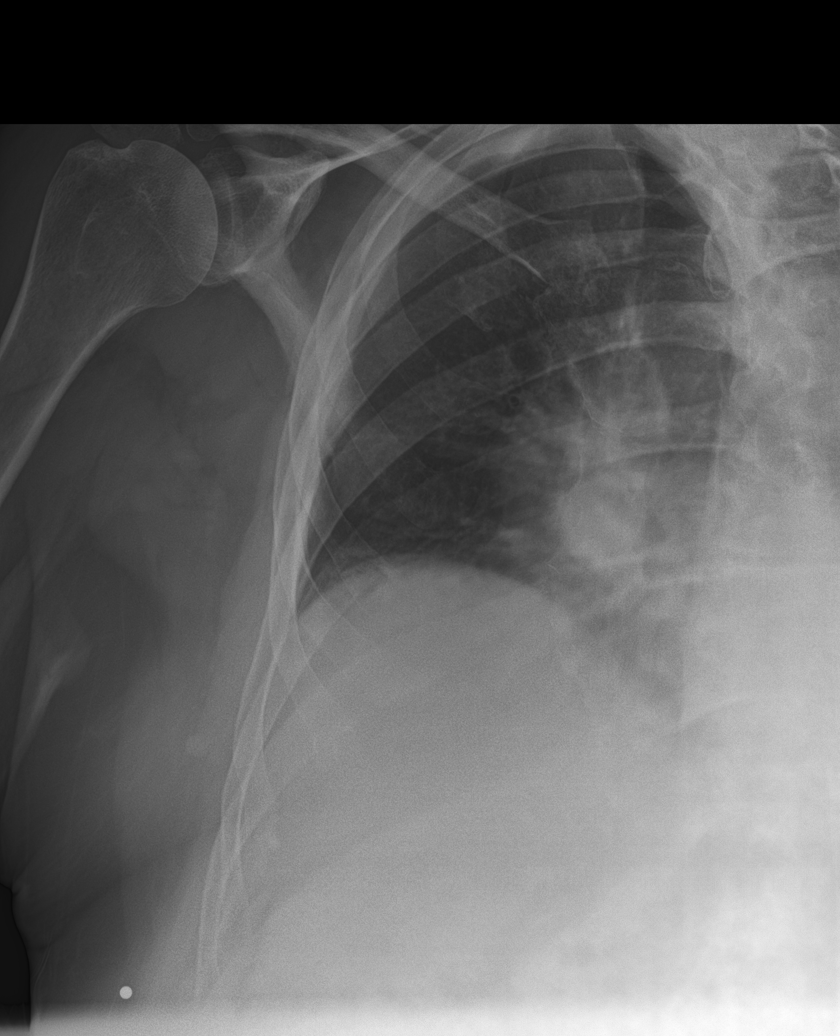

[rib pa (2 of 2)]
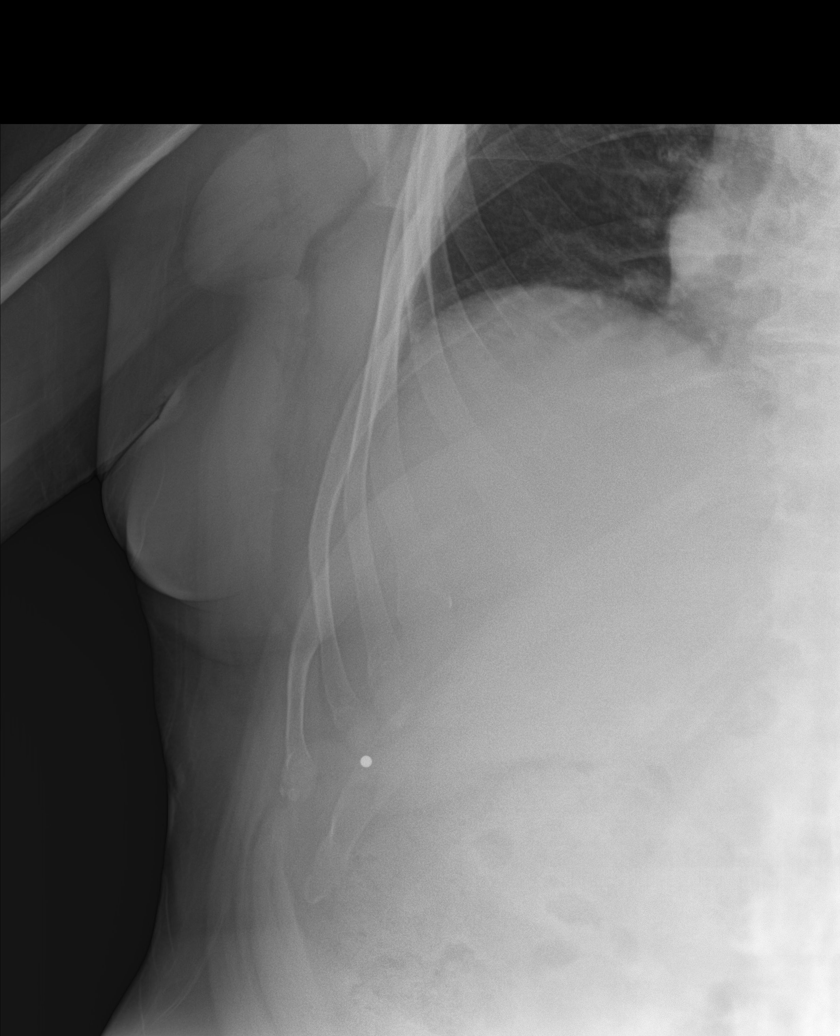

[5 of 5 positions shown; findings below may reference images not displayed]

FINDINGS: There is slight deformities of the anterior aspects of the right
sixth and seventh ribs which appear new since the prior study.

The heart size and pulmonary vascularity are normal. Lungs are
clear. No pneumothorax or pleural effusion.
IMPRESSION: Fractures of the anterolateral aspects of the right sixth and
seventh ribs.

## 2020-01-04 MED ORDER — HYDROCODONE-ACETAMINOPHEN 5-325 MG PO TABS: 1.0000 | ORAL_TABLET | Freq: Four times a day (QID) | ORAL | 0 refills | Status: DC | PRN

## 2020-01-04 NOTE — Patient Instructions (Signed)
I have sent in a refill of the pain medication.  I want you to try and use something like Aleve or ibuprofen as your MAIN pain medication as this will help with the inflammation.  You can use the hydrocodone on top of this if needed.    We discussed icing the area and using a pillow to help with moving around.  If you develop shortness of breath, severe pain or cough up blood you need to seek immediate medical attention.  If symptoms are not better in the next couple of weeks I want you to follow-up with Dr. Louanne Skye   Costochondritis Costochondritis is swelling and irritation (inflammation) of the tissue (cartilage) that connects your ribs to your breastbone (sternum). This causes pain in the front of your chest. Usually, the pain:  Starts gradually.  Is in more than one rib. This condition usually goes away on its own over time. Follow these instructions at home:  Do not do anything that makes your pain worse.  If directed, put ice on the painful area: ? Put ice in a plastic bag. ? Place a towel between your skin and the bag. ? Leave the ice on for 20 minutes, 2-3 times a day.  If directed, put heat on the affected area as often as told by your doctor. Use the heat source that your doctor tells you to use, such as a moist heat pack or a heating pad. ? Place a towel between your skin and the heat source. ? Leave the heat on for 20-30 minutes. ? Take off the heat if your skin turns bright red. This is very important if you cannot feel pain, heat, or cold. You may have a greater risk of getting burned.  Take over-the-counter and prescription medicines only as told by your doctor.  Return to your normal activities as told by your doctor. Ask your doctor what activities are safe for you.  Keep all follow-up visits as told by your doctor. This is important. Contact a doctor if:  You have chills or a fever.  Your pain does not go away or it gets worse.  You have a cough that does  not go away. Get help right away if:  You are short of breath. This information is not intended to replace advice given to you by your health care provider. Make sure you discuss any questions you have with your health care provider. Document Revised: 12/30/2017 Document Reviewed: 04/09/2016 Elsevier Patient Education  2020 [employer].

## 2020-01-04 NOTE — Progress Notes (Signed)
Subjective: CC: rib pain PCP: Dettinger, Fransisca Kaufmann, MD QIO:NGEXB D Holland is a 79 y.o. male presenting to clinic today for:  1. Rib pain Patient reports ongoing right sided anterior rib pain after a fall 12/28/2019.  He reports norco did help make it bearable but did not fully relieve.  He has been eating more fiber to prevent constipation. No shortness of breath but pain is worse with certain movements.  Previous xray without evidence of fracture   ROS: Per HPI  Allergies  Allergen Reactions  . Diphenhydramine Hcl Other (See Comments)    Can tolerate   Past Medical History:  Diagnosis Date  . COPD (chronic obstructive pulmonary disease) ([place])   . Diabetes mellitus without complication ([place])   . Diverticulitis   . Hydronephrosis of left kidney   . Hyperlipidemia   . Hypertension   . Left ureteral stone    obstructing  . Renal calculus or stone 2015    Current Outpatient Medications:  .  ACCU-CHEK SOFTCLIX LANCETS lancets, CHECK BLOOD SUGAR ONCE DAILY OR AS DIRECTED, Disp: 100 each, Rfl: 2 .  albuterol (PROAIR HFA) 108 (90 Base) MCG/ACT inhaler, USE 2 PUFFS EVERY 6 HOURS  AS NEEDED FOR WHEEZING, Disp: 8.5 g, Rfl: 5 .  albuterol (PROVENTIL) (2.5 MG/3ML) 0.083% nebulizer solution, Take 3 mLs (2.5 mg total) by nebulization 4 (four) times daily as needed for wheezing or shortness of breath., Disp: 360 mL, Rfl: 1 .  aspirin 81 MG EC tablet, Take 81 mg by mouth daily.  , Disp: , Rfl:  .  atorvastatin (LIPITOR) 40 MG tablet, Take 1 tablet (40 mg total) by mouth daily., Disp: 90 tablet, Rfl: 3 .  blood glucose meter kit and supplies, Dispense based on patient and insurance preference. Use up to four times daily as directed. (FOR ICD-10 E10.9, E11.9)., Disp: 1 each, Rfl: 0 .  Blood Glucose Monitoring Suppl (ACCU-CHEK AVIVA CONNECT) w/Device KIT, 1 kit daily by Does not apply route., Disp: 1 kit, Rfl: 0 .  Cholecalciferol (VITAMIN D) 1000 UNITS capsule, Take 1,000 Units by mouth 2 (two)  times daily. , Disp: , Rfl:  .  fluticasone (FLONASE) 50 MCG/ACT nasal spray, USE 1 SPRAY INTO BOTH  NOSTRILS 2 TIMES DAILY AS  NEEDED FOR ALLERGIES OR  RHINITIS., Disp: 48 g, Rfl: 3 .  gabapentin (NEURONTIN) 100 MG capsule, Take 1-2 capsules (100-200 mg total) by mouth at bedtime. (for nerve pain), Disp: 30 capsule, Rfl: 0 .  glucose blood (ACCU-CHEK AVIVA PLUS) test strip, [place] to check blood sugars once daily, Disp: 100 each, Rfl: 2 .  HYDROcodone-acetaminophen (NORCO/VICODIN) 5-325 MG tablet, Take 1 tablet by mouth every 6 (six) hours as needed for severe pain (if ibuprofen does not help)., Disp: 20 tablet, Rfl: 0 .  lidocaine (LIDODERM) 5 %, Place 1 patch onto the skin daily. Remove & Discard patch within 12 hours or as directed by MD, Disp: 30 patch, Rfl: 0 .  lisinopril (PRINIVIL,ZESTRIL) 20 MG tablet, Take 1 tablet (20 mg total) by mouth daily., Disp: 90 tablet, Rfl: 3 .  LORazepam (ATIVAN) 0.5 MG tablet, Take 1 tablet 1 hour before MRI.  May repeat 1 time if needed., Disp: 2 tablet, Rfl: 0 .  metFORMIN (GLUCOPHAGE-XR) 500 MG 24 hr tablet, Take 1 tablet (500 mg total) by mouth 2 (two) times daily., Disp: 180 tablet, Rfl: 3 .  metoprolol succinate (TOPROL-XL) 50 MG 24 hr tablet, Take 1 tablet (50 mg total) by mouth daily. Take with  or immediately following a meal., Disp: 90 tablet, Rfl: 3 .  omeprazole (PRILOSEC) 20 MG capsule, Take 1 capsule (20 mg total) by mouth daily., Disp: 90 capsule, Rfl: 3 .  predniSONE (STERAPRED UNI-PAK 48 TAB) 10 MG (48) TBPK tablet, Take as directed for 12 days, Disp: 48 tablet, Rfl: 0 .  tamsulosin (FLOMAX) 0.4 MG CAPS capsule, TAKE 2 CAPSULES BY MOUTH  DAILY AFTER SUPPER, Disp: 180 capsule, Rfl: 3 Social History   Socioeconomic History  . Marital status: Married    Spouse name: Not on file  . Number of children: Not on file  . Years of education: Not on file  . Highest education level: Not on file  Occupational History  . Not on file  Tobacco Use  . Smoking  status: Former Smoker    Quit date: 06/18/1967    Years since quitting: 52.5  . Smokeless tobacco: Former Systems developer    Types: Chew  Substance and Sexual Activity  . Alcohol use: No  . Drug use: No  . Sexual activity: Not Currently  Other Topics Concern  . Not on file  Social History Narrative  . Not on file   Social Determinants of Health   Financial Resource Strain:   . Difficulty of Paying Living Expenses: Not on file  Food Insecurity:   . Worried About Charity fundraiser in the Last Year: Not on file  . Ran Out of Food in the Last Year: Not on file  Transportation Needs:   . Lack of Transportation (Medical): Not on file  . Lack of Transportation (Non-Medical): Not on file  Physical Activity:   . Days of Exercise per Week: Not on file  . Minutes of Exercise per Session: Not on file  Stress:   . Feeling of Stress : Not on file  Social Connections:   . Frequency of Communication with Friends and Family: Not on file  . Frequency of Social Gatherings with Friends and Family: Not on file  . Attends Religious Services: Not on file  . Active Member of Clubs or Organizations: Not on file  . Attends Archivist Meetings: Not on file  . Marital Status: Not on file  Intimate Partner Violence:   . Fear of Current or Ex-Partner: Not on file  . Emotionally Abused: Not on file  . Physically Abused: Not on file  . Sexually Abused: Not on file   Family History  Problem Relation Age of Onset  . Early death Father        MI age 58  . Heart attack Father   . COPD Brother   . Cancer Sister     Objective: Office vital signs reviewed. BP 134/83   Pulse 94   Temp 97.8 F (36.6 C) (Temporal)   Ht '5\' 9"'$  (1.753 m)   Wt 244 lb (110.7 kg)   SpO2 97%   BMI 36.03 kg/m   Physical Examination:  General: Awake, alert, obese, No acute distress Pulm: CTAB, normal work of breathing on room air. MSK: TTP out of proportion to exam on right ribs 6-10. No palpable step off. No  bruising.  DG Ribs Unilateral W/Chest Right  Result Date: 01/04/2020 CLINICAL DATA:  Trauma secondary to a fall. EXAM: RIGHT RIBS AND CHEST - 3+ VIEW COMPARISON:  Radiographs dated 12/29/2019 FINDINGS: There is slight deformities of the anterior aspects of the right sixth and seventh ribs which appear new since the prior study. The heart size and pulmonary vascularity are normal.  Lungs are clear. No pneumothorax or pleural effusion. IMPRESSION: Fractures of the anterolateral aspects of the right sixth and seventh ribs. Electronically Signed   By: Lorriane Shire M.D.   On: 01/04/2020 12:03    Assessment/ Plan: 79 y.o. male   1. Fall, initial encounter X-rays were repeated due to ongoing rib pain.  Personal review of x-ray demonstrated no fracture but radiology review does show fractures on the right sixth and seventh ribs anteriorly.  This is consistent with where his pain was. - DG Ribs Unilateral W/Chest Right; Future  2. Rib pain on right side I have continued his pain medication given fractures.  We discussed home care.  He will follow-up with PCP as needed.  National narcotic database was reviewed and there were no red flags. - DG Ribs Unilateral W/Chest Right; Future - HYDROcodone-acetaminophen (NORCO/VICODIN) 5-325 MG tablet; Take 1 tablet by mouth every 6 (six) hours as needed for severe pain (if ibuprofen does not help).  Dispense: 20 tablet; Refill: 0  3. Closed fracture of multiple ribs of right side with routine healing, subsequent encounter As above  4. Need for immunization against influenza administered - Flu Vaccine QUAD High Dose(Fluad)   Orders Placed This Encounter  Procedures  . DG Ribs Unilateral W/Chest Right    Standing Status:   Future    Number of Occurrences:   1    Standing Expiration Date:   03/05/2021    Order Specific Question:   Reason for Exam (SYMPTOM  OR DIAGNOSIS REQUIRED)    Answer:   fall    Order Specific Question:   Preferred imaging location?     Answer:   Internal  . Flu Vaccine QUAD High Dose(Fluad)   Meds ordered this encounter  Medications  . HYDROcodone-acetaminophen (NORCO/VICODIN) 5-325 MG tablet    Sig: Take 1 tablet by mouth every 6 (six) hours as needed for severe pain (if ibuprofen does not help).    Dispense:  20 tablet    Refill:  Thompson, DO [place] [phone]

## 2020-01-05 ENCOUNTER — Telehealth: Payer: Self-pay | Admitting: Family Medicine

## 2020-01-05 DIAGNOSIS — S3421XD Injury of nerve root of lumbar spine, subsequent encounter: Secondary | ICD-10-CM

## 2020-01-05 NOTE — Telephone Encounter (Signed)
Placed the referral for the patient.

## 2020-01-05 NOTE — Telephone Encounter (Signed)
Patients wife wants to clarify what pain medications he is supposed to be taking. Says he was prescribed Prednisone back in December and was prescribed other pain medication yesterday. Wants to speak with nurse.

## 2020-01-05 NOTE — Telephone Encounter (Signed)
Spoke with pt's wife and advised yesterday pt was given Norco to take only if Ibuprofen or Aleve didn't relieve the pain. He should only take the Norco if he needs it. Pt's wife voiced understanding.

## 2020-01-05 NOTE — Telephone Encounter (Signed)
-----   Message from Cleda Daub, LPN sent at 0/02/1593 12:23 PM EST ----- Patient notified and verbalized understanding. He would like to see a neurosurgeon. He states that he last saw one in 1989. I advised that we will see who we can get him in with the quickest. Can the referral be placed?

## 2020-01-10 DIAGNOSIS — M5416 Radiculopathy, lumbar region: Secondary | ICD-10-CM | POA: Diagnosis not present

## 2020-01-26 DIAGNOSIS — M5126 Other intervertebral disc displacement, lumbar region: Secondary | ICD-10-CM | POA: Insufficient documentation

## 2020-01-26 DIAGNOSIS — E119 Type 2 diabetes mellitus without complications: Secondary | ICD-10-CM | POA: Diagnosis not present

## 2020-01-26 DIAGNOSIS — M5416 Radiculopathy, lumbar region: Secondary | ICD-10-CM | POA: Diagnosis not present

## 2020-02-03 ENCOUNTER — Ambulatory Visit: Payer: Medicare Other | Admitting: Family Medicine

## 2020-02-07 ENCOUNTER — Telehealth: Payer: Self-pay | Admitting: Family Medicine

## 2020-02-07 NOTE — Chronic Care Management (AMB) (Signed)
Chronic Care Management   Note  02/07/2020 Name: Vincent Collins MRN: [id number] DOB: 1941-07-11  [name] is a 79 y.o. year old male who is a primary care patient of Dettinger, Fransisca Kaufmann, MD. I reached out to [name] by phone today in response to a referral sent by [name]'s health plan.     [name] was given information about Chronic Care Management services today including:  1. CCM service includes personalized support from designated clinical staff supervised by his physician, including individualized plan of care and coordination with other care providers 2. 24/7 contact phone numbers for assistance for urgent and routine care needs. 3. Service will only be billed when office clinical staff spend 20 minutes or more in a month to coordinate care. 4. Only one practitioner may furnish and bill the service in a calendar month. 5. The patient may stop CCM services at any time (effective at the end of the month) by phone call to the office staff. 6. The patient will be responsible for cost sharing (co-pay) of up to 20% of the service fee (after annual deductible is met).  Patient did not agree to enrollment in care management services and does not wish to consider at this time.  Follow up plan: Telephone appointment with care management team member scheduled for: The patient has been provided with contact information for the care management team and has been advised to call with any health related questions or concerns.   Noreene Larsson, [place], [place], [place] [address] Direct Dial: [phone] [email or web address] Website: [email or web address]

## 2020-03-03 ENCOUNTER — Other Ambulatory Visit: Payer: Self-pay | Admitting: Family Medicine

## 2020-03-03 DIAGNOSIS — J019 Acute sinusitis, unspecified: Secondary | ICD-10-CM

## 2020-03-03 DIAGNOSIS — I10 Essential (primary) hypertension: Secondary | ICD-10-CM

## 2020-03-03 DIAGNOSIS — N401 Enlarged prostate with lower urinary tract symptoms: Secondary | ICD-10-CM

## 2020-03-03 DIAGNOSIS — K2950 Unspecified chronic gastritis without bleeding: Secondary | ICD-10-CM

## 2020-03-03 DIAGNOSIS — E1159 Type 2 diabetes mellitus with other circulatory complications: Secondary | ICD-10-CM

## 2020-03-05 NOTE — Telephone Encounter (Signed)
Vincent Collins. Pt needs his 6 mos appt in April. Mail order sent for one refill

## 2020-03-20 DIAGNOSIS — N281 Cyst of kidney, acquired: Secondary | ICD-10-CM | POA: Diagnosis not present

## 2020-03-20 DIAGNOSIS — K529 Noninfective gastroenteritis and colitis, unspecified: Secondary | ICD-10-CM | POA: Diagnosis not present

## 2020-03-20 DIAGNOSIS — R079 Chest pain, unspecified: Secondary | ICD-10-CM | POA: Diagnosis not present

## 2020-03-20 DIAGNOSIS — R9431 Abnormal electrocardiogram [ECG] [EKG]: Secondary | ICD-10-CM | POA: Diagnosis not present

## 2020-03-20 DIAGNOSIS — Z87891 Personal history of nicotine dependence: Secondary | ICD-10-CM | POA: Diagnosis not present

## 2020-03-20 DIAGNOSIS — E119 Type 2 diabetes mellitus without complications: Secondary | ICD-10-CM | POA: Diagnosis not present

## 2020-03-20 DIAGNOSIS — J449 Chronic obstructive pulmonary disease, unspecified: Secondary | ICD-10-CM | POA: Diagnosis not present

## 2020-03-20 DIAGNOSIS — I10 Essential (primary) hypertension: Secondary | ICD-10-CM | POA: Diagnosis not present

## 2020-03-20 DIAGNOSIS — K219 Gastro-esophageal reflux disease without esophagitis: Secondary | ICD-10-CM | POA: Diagnosis not present

## 2020-03-20 DIAGNOSIS — M546 Pain in thoracic spine: Secondary | ICD-10-CM | POA: Diagnosis not present

## 2020-03-20 DIAGNOSIS — K5989 Other specified functional intestinal disorders: Secondary | ICD-10-CM | POA: Diagnosis not present

## 2020-03-20 DIAGNOSIS — Z7984 Long term (current) use of oral hypoglycemic drugs: Secondary | ICD-10-CM | POA: Diagnosis not present

## 2020-03-20 DIAGNOSIS — K439 Ventral hernia without obstruction or gangrene: Secondary | ICD-10-CM | POA: Diagnosis not present

## 2020-03-20 DIAGNOSIS — Z7982 Long term (current) use of aspirin: Secondary | ICD-10-CM | POA: Diagnosis not present

## 2020-03-20 DIAGNOSIS — Z79899 Other long term (current) drug therapy: Secondary | ICD-10-CM | POA: Diagnosis not present

## 2020-03-20 MED ORDER — GENERIC EXTERNAL MEDICATION
Status: DC
Start: ? — End: 2020-03-20

## 2020-03-20 MED ORDER — SODIUM CHLORIDE 0.9 % IV SOLN
10.00 | INTRAVENOUS | Status: DC
Start: ? — End: 2020-03-20

## 2020-03-24 ENCOUNTER — Emergency Department (HOSPITAL_COMMUNITY): Payer: Medicare Other

## 2020-03-24 ENCOUNTER — Encounter (HOSPITAL_COMMUNITY): Payer: Self-pay | Admitting: Emergency Medicine

## 2020-03-24 ENCOUNTER — Other Ambulatory Visit: Payer: Self-pay

## 2020-03-24 ENCOUNTER — Inpatient Hospital Stay (HOSPITAL_COMMUNITY)
Admission: EM | Admit: 2020-03-24 | Discharge: 2020-03-30 | DRG: 871 | Disposition: A | Discharge status: Home / Self Care | Payer: Medicare Other | Attending: Internal Medicine | Admitting: Internal Medicine

## 2020-03-24 DIAGNOSIS — A419 Sepsis, unspecified organism: Secondary | ICD-10-CM | POA: Diagnosis not present

## 2020-03-24 DIAGNOSIS — Z888 Allergy status to other drugs, medicaments and biological substances status: Secondary | ICD-10-CM

## 2020-03-24 DIAGNOSIS — Z87891 Personal history of nicotine dependence: Secondary | ICD-10-CM

## 2020-03-24 DIAGNOSIS — Z6837 Body mass index (BMI) 37.0-37.9, adult: Secondary | ICD-10-CM

## 2020-03-24 DIAGNOSIS — E669 Obesity, unspecified: Secondary | ICD-10-CM | POA: Diagnosis present

## 2020-03-24 DIAGNOSIS — A4151 Sepsis due to Escherichia coli [E. coli]: Secondary | ICD-10-CM | POA: Diagnosis not present

## 2020-03-24 DIAGNOSIS — Z79899 Other long term (current) drug therapy: Secondary | ICD-10-CM

## 2020-03-24 DIAGNOSIS — Z87442 Personal history of urinary calculi: Secondary | ICD-10-CM

## 2020-03-24 DIAGNOSIS — K828 Other specified diseases of gallbladder: Secondary | ICD-10-CM | POA: Diagnosis not present

## 2020-03-24 DIAGNOSIS — I480 Paroxysmal atrial fibrillation: Secondary | ICD-10-CM | POA: Diagnosis present

## 2020-03-24 DIAGNOSIS — R0602 Shortness of breath: Secondary | ICD-10-CM

## 2020-03-24 DIAGNOSIS — K219 Gastro-esophageal reflux disease without esophagitis: Secondary | ICD-10-CM | POA: Diagnosis not present

## 2020-03-24 DIAGNOSIS — Z7982 Long term (current) use of aspirin: Secondary | ICD-10-CM

## 2020-03-24 DIAGNOSIS — R1013 Epigastric pain: Secondary | ICD-10-CM | POA: Diagnosis not present

## 2020-03-24 DIAGNOSIS — E119 Type 2 diabetes mellitus without complications: Secondary | ICD-10-CM | POA: Diagnosis not present

## 2020-03-24 DIAGNOSIS — I493 Ventricular premature depolarization: Secondary | ICD-10-CM | POA: Diagnosis present

## 2020-03-24 DIAGNOSIS — E785 Hyperlipidemia, unspecified: Secondary | ICD-10-CM | POA: Diagnosis present

## 2020-03-24 DIAGNOSIS — K5730 Diverticulosis of large intestine without perforation or abscess without bleeding: Secondary | ICD-10-CM | POA: Diagnosis not present

## 2020-03-24 DIAGNOSIS — J449 Chronic obstructive pulmonary disease, unspecified: Secondary | ICD-10-CM | POA: Diagnosis present

## 2020-03-24 DIAGNOSIS — K819 Cholecystitis, unspecified: Secondary | ICD-10-CM | POA: Diagnosis not present

## 2020-03-24 DIAGNOSIS — R6520 Severe sepsis without septic shock: Secondary | ICD-10-CM | POA: Diagnosis not present

## 2020-03-24 DIAGNOSIS — E1169 Type 2 diabetes mellitus with other specified complication: Secondary | ICD-10-CM | POA: Diagnosis not present

## 2020-03-24 DIAGNOSIS — K8591 Acute pancreatitis with uninfected necrosis, unspecified: Secondary | ICD-10-CM | POA: Diagnosis present

## 2020-03-24 DIAGNOSIS — K429 Umbilical hernia without obstruction or gangrene: Secondary | ICD-10-CM | POA: Diagnosis not present

## 2020-03-24 DIAGNOSIS — E876 Hypokalemia: Secondary | ICD-10-CM | POA: Diagnosis not present

## 2020-03-24 DIAGNOSIS — Z825 Family history of asthma and other chronic lower respiratory diseases: Secondary | ICD-10-CM

## 2020-03-24 DIAGNOSIS — R1011 Right upper quadrant pain: Secondary | ICD-10-CM

## 2020-03-24 DIAGNOSIS — R0600 Dyspnea, unspecified: Secondary | ICD-10-CM

## 2020-03-24 DIAGNOSIS — I071 Rheumatic tricuspid insufficiency: Secondary | ICD-10-CM | POA: Diagnosis not present

## 2020-03-24 DIAGNOSIS — J9601 Acute respiratory failure with hypoxia: Secondary | ICD-10-CM | POA: Diagnosis not present

## 2020-03-24 DIAGNOSIS — Z8249 Family history of ischemic heart disease and other diseases of the circulatory system: Secondary | ICD-10-CM

## 2020-03-24 DIAGNOSIS — N400 Enlarged prostate without lower urinary tract symptoms: Secondary | ICD-10-CM | POA: Diagnosis present

## 2020-03-24 DIAGNOSIS — K8309 Other cholangitis: Secondary | ICD-10-CM | POA: Diagnosis not present

## 2020-03-24 DIAGNOSIS — R509 Fever, unspecified: Secondary | ICD-10-CM | POA: Diagnosis not present

## 2020-03-24 DIAGNOSIS — Z7984 Long term (current) use of oral hypoglycemic drugs: Secondary | ICD-10-CM

## 2020-03-24 DIAGNOSIS — R0601 Orthopnea: Secondary | ICD-10-CM | POA: Diagnosis not present

## 2020-03-24 DIAGNOSIS — Z20822 Contact with and (suspected) exposure to covid-19: Secondary | ICD-10-CM | POA: Diagnosis not present

## 2020-03-24 DIAGNOSIS — I10 Essential (primary) hypertension: Secondary | ICD-10-CM | POA: Diagnosis not present

## 2020-03-24 DIAGNOSIS — R918 Other nonspecific abnormal finding of lung field: Secondary | ICD-10-CM | POA: Diagnosis not present

## 2020-03-24 DIAGNOSIS — K851 Biliary acute pancreatitis without necrosis or infection: Secondary | ICD-10-CM | POA: Diagnosis not present

## 2020-03-24 DIAGNOSIS — I4891 Unspecified atrial fibrillation: Secondary | ICD-10-CM | POA: Diagnosis not present

## 2020-03-24 DIAGNOSIS — K5900 Constipation, unspecified: Secondary | ICD-10-CM | POA: Diagnosis not present

## 2020-03-24 HISTORY — DX: Multiple fractures of ribs, right side, initial encounter for closed fracture: S22.41XA

## 2020-03-24 HISTORY — DX: Gastro-esophageal reflux disease without esophagitis: K21.9

## 2020-03-24 LAB — PROTIME-INR
INR: 1 (ref 0.8–1.2)
Prothrombin Time: 13.2 seconds (ref 11.4–15.2)

## 2020-03-24 LAB — CBC WITH DIFFERENTIAL/PLATELET
Abs Immature Granulocytes: 0.03 10*3/uL (ref 0.00–0.07)
Basophils Absolute: 0 10*3/uL (ref 0.0–0.1)
Basophils Relative: 0 %
Eosinophils Absolute: 0 10*3/uL (ref 0.0–0.5)
Eosinophils Relative: 0 %
HCT: 47.7 % (ref 39.0–52.0)
Hemoglobin: 16.1 g/dL (ref 13.0–17.0)
Immature Granulocytes: 0 %
Lymphocytes Relative: 7 %
Lymphs Abs: 0.5 10*3/uL — ABNORMAL LOW (ref 0.7–4.0)
MCH: 30.4 pg (ref 26.0–34.0)
MCHC: 33.8 g/dL (ref 30.0–36.0)
MCV: 90 fL (ref 80.0–100.0)
Monocytes Absolute: 0 10*3/uL — ABNORMAL LOW (ref 0.1–1.0)
Monocytes Relative: 0 %
Neutro Abs: 7.1 10*3/uL (ref 1.7–7.7)
Neutrophils Relative %: 93 %
Platelets: 178 10*3/uL (ref 150–400)
RBC: 5.3 MIL/uL (ref 4.22–5.81)
RDW: 12.8 % (ref 11.5–15.5)
WBC: 7.7 10*3/uL (ref 4.0–10.5)
nRBC: 0 % (ref 0.0–0.2)

## 2020-03-24 LAB — COMPREHENSIVE METABOLIC PANEL
ALT: 255 U/L — ABNORMAL HIGH (ref 0–44)
AST: 352 U/L — ABNORMAL HIGH (ref 15–41)
Albumin: 3.5 g/dL (ref 3.5–5.0)
Alkaline Phosphatase: 195 U/L — ABNORMAL HIGH (ref 38–126)
Anion gap: 12 (ref 5–15)
BUN: 21 mg/dL (ref 8–23)
CO2: 20 mmol/L — ABNORMAL LOW (ref 22–32)
Calcium: 8.3 mg/dL — ABNORMAL LOW (ref 8.9–10.3)
Chloride: 104 mmol/L (ref 98–111)
Creatinine, Ser: 1.07 mg/dL (ref 0.61–1.24)
GFR calc Af Amer: >60 mL/min (ref >60)
GFR calc non Af Amer: >60 mL/min (ref >60)
Glucose, Bld: 226 mg/dL — ABNORMAL HIGH (ref 70–99)
Potassium: 3.6 mmol/L (ref 3.5–5.1)
Sodium: 136 mmol/L (ref 135–145)
Total Bilirubin: 4.2 mg/dL — ABNORMAL HIGH (ref 0.3–1.2)
Total Protein: 6.6 g/dL (ref 6.5–8.1)

## 2020-03-24 LAB — RESPIRATORY PANEL BY RT PCR (FLU A&B, COVID)
Influenza A by PCR: NEGATIVE
Influenza B by PCR: NEGATIVE
SARS Coronavirus 2 by RT PCR: NEGATIVE

## 2020-03-24 LAB — LACTIC ACID, PLASMA
Lactic Acid, Venous: 3.3 mmol/L (ref 0.5–1.9)
Lactic Acid, Venous: 2.7 mmol/L (ref 0.5–1.9)

## 2020-03-24 LAB — POC SARS CORONAVIRUS 2 AG -  ED: SARS Coronavirus 2 Ag: NEGATIVE

## 2020-03-24 IMAGING — DX DG CHEST 1V PORT
1 series · 1 of 1 positions shown · non-contrast
Comparison: [DATE]

CLINICAL DATA: Sepsis.

EXAM:
PORTABLE CHEST 1 VIEW

[chest ap]
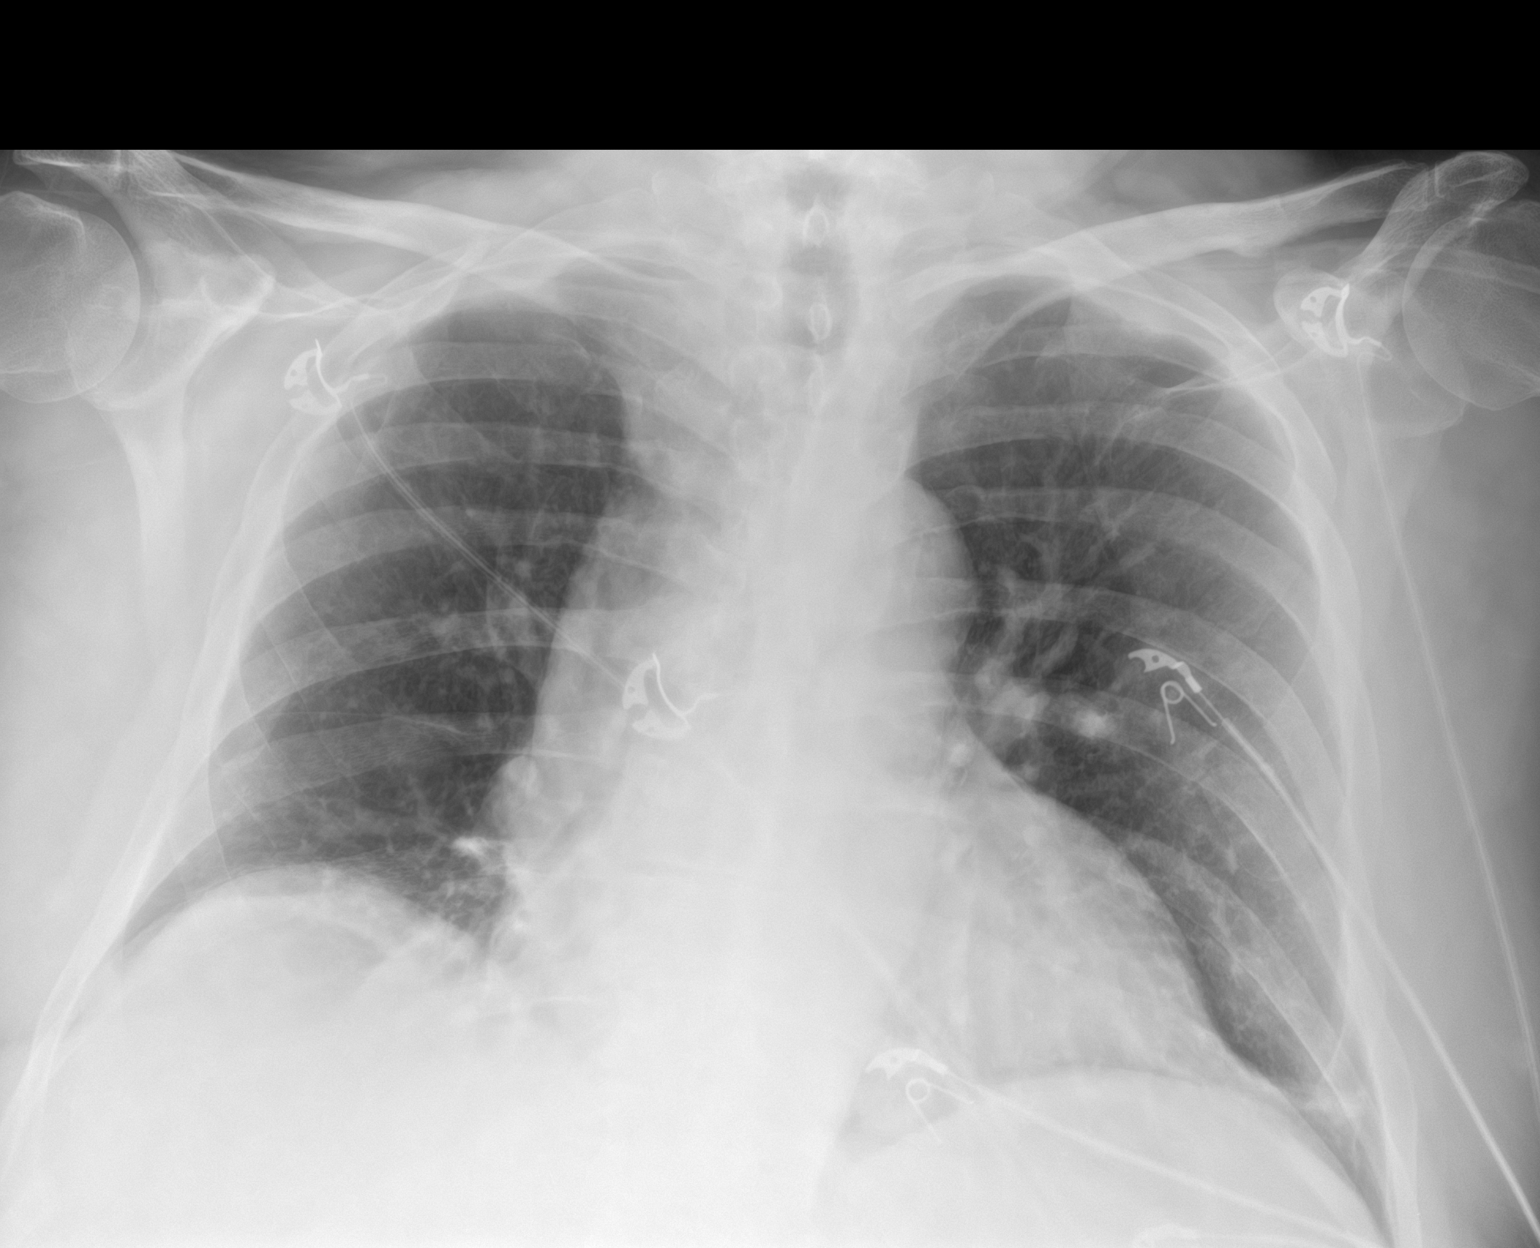

[1 of 1 positions shown; findings below may reference images not displayed]

FINDINGS: Heart size is enlarged. There is no pneumothorax or pleural
effusion. There are airspace opacities at the lung bases bilaterally
favored to represent areas of scarring atelectasis. There is no
acute osseous abnormality.
IMPRESSION: 1. Cardiomegaly.
2. Bibasilar airspace opacities favored to represent scarring
atelectasis.

## 2020-03-24 IMAGING — US US ABDOMEN LIMITED
1 series · 14 of 25 positions shown · non-contrast
Comparison: None.

CLINICAL DATA: Right upper quadrant pain

EXAM:
ULTRASOUND ABDOMEN LIMITED RIGHT UPPER QUADRANT

[Series 1: us abdomen limited ruq · 14 of 30 slices shown]
[im 1/30]
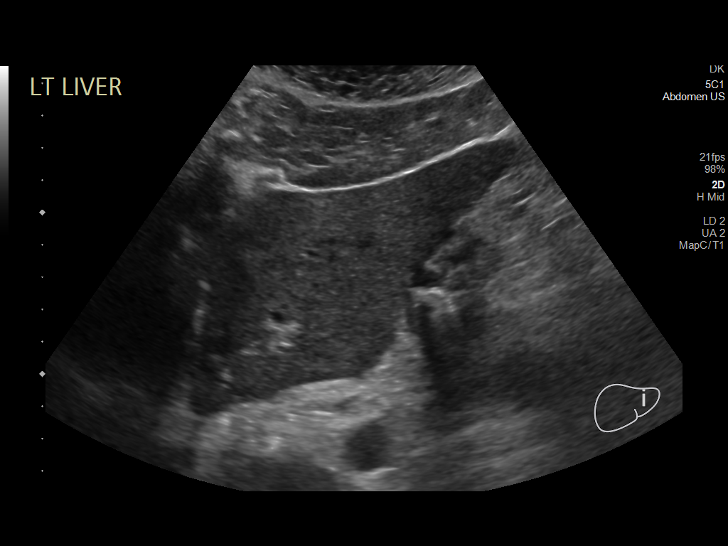
[im 3/30]
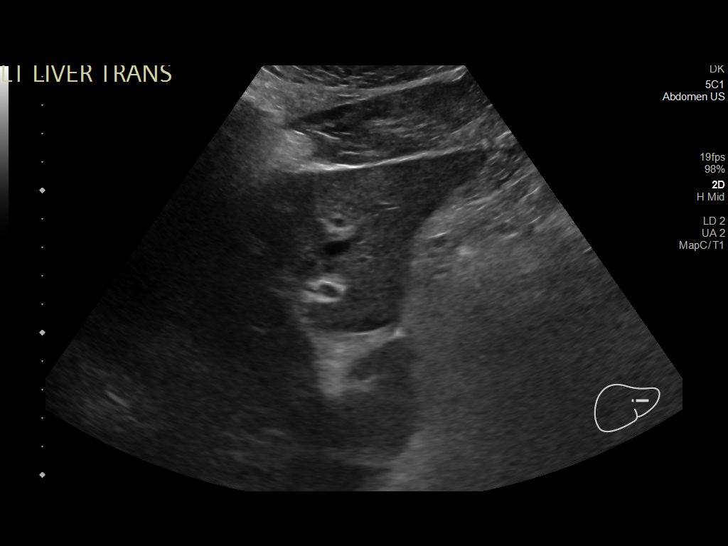
[im 5/30]
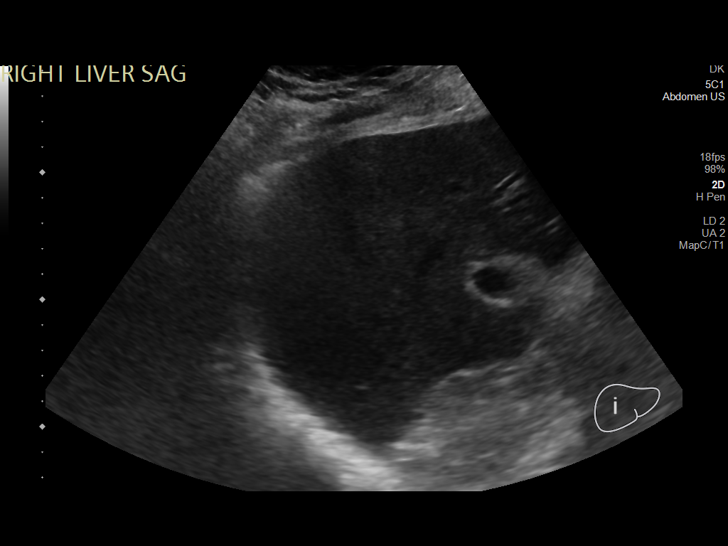
[im 8/30]
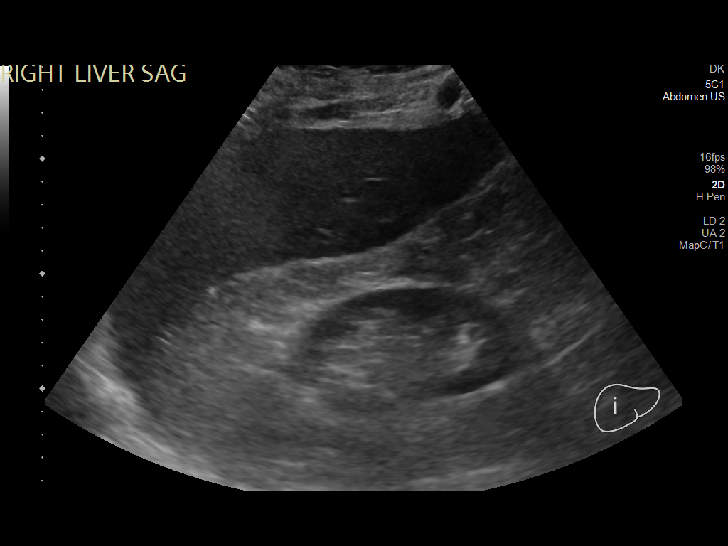
[im 10/30]
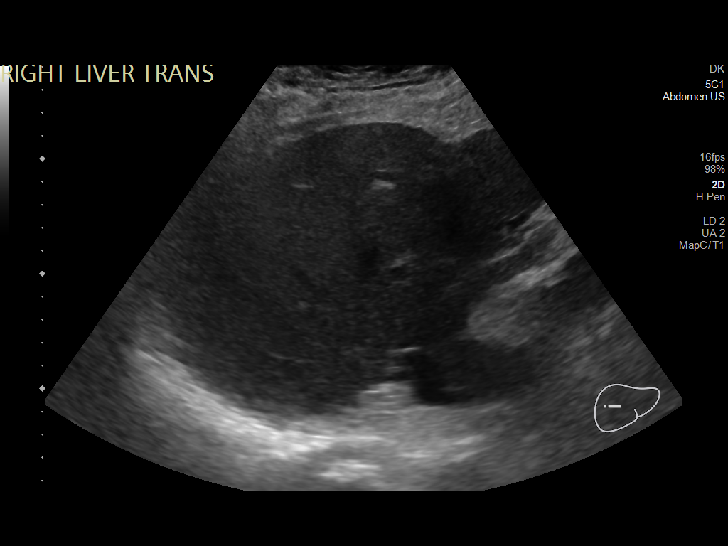
[im 11/30]
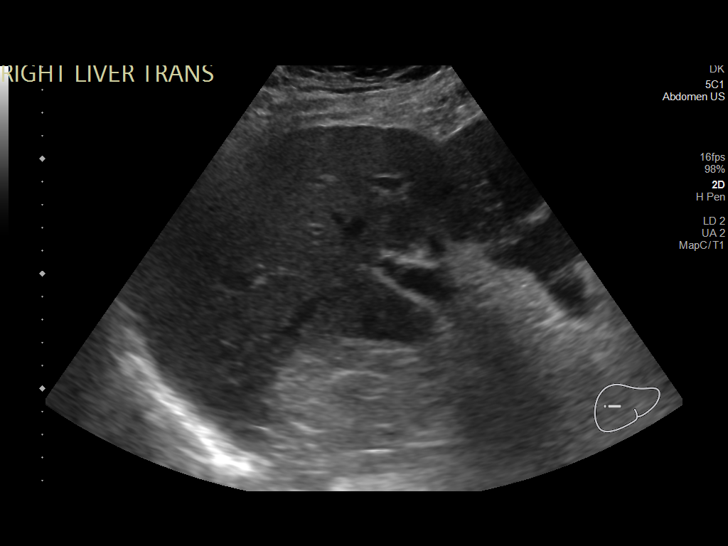
[im 14/30]
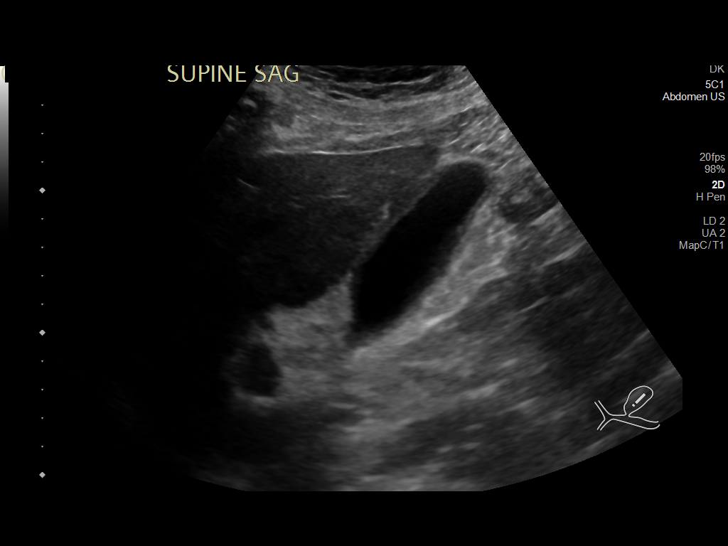
[im 16/30]
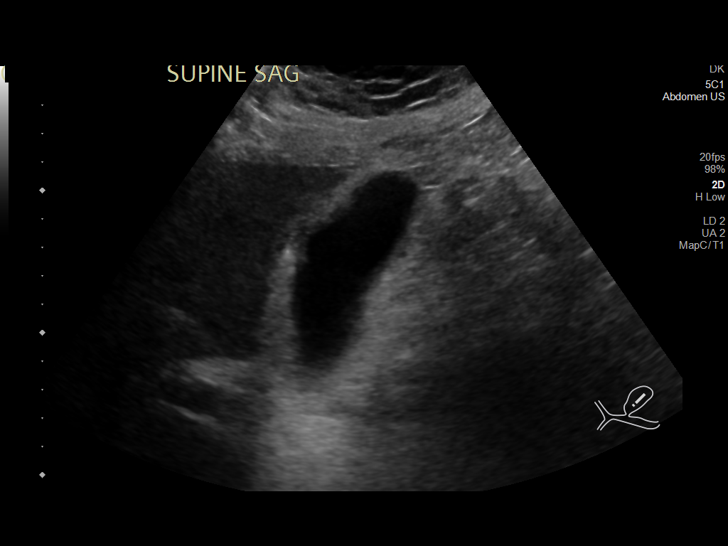
[im 19/30]
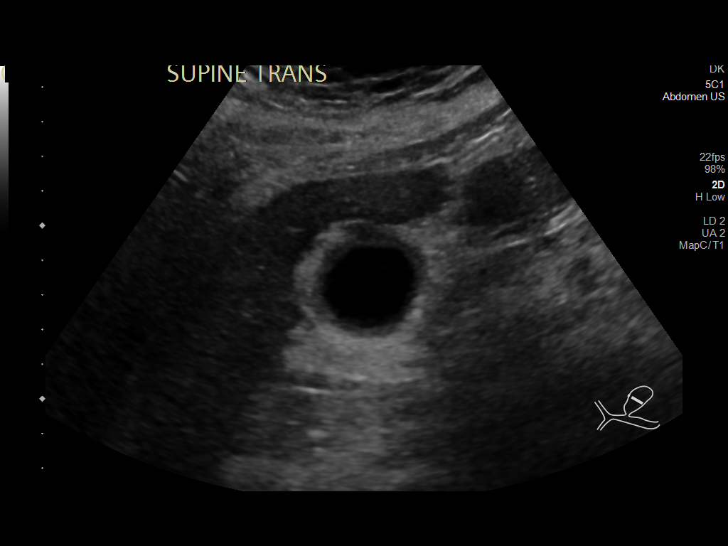
[im 20/30]
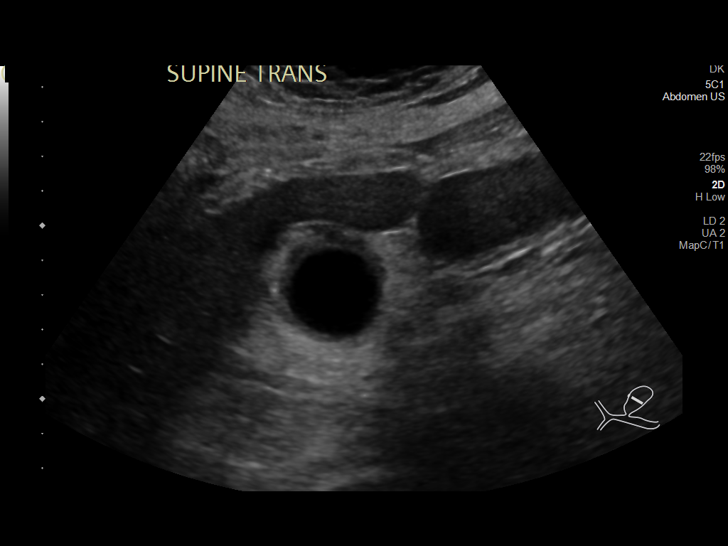
[im 22/30]
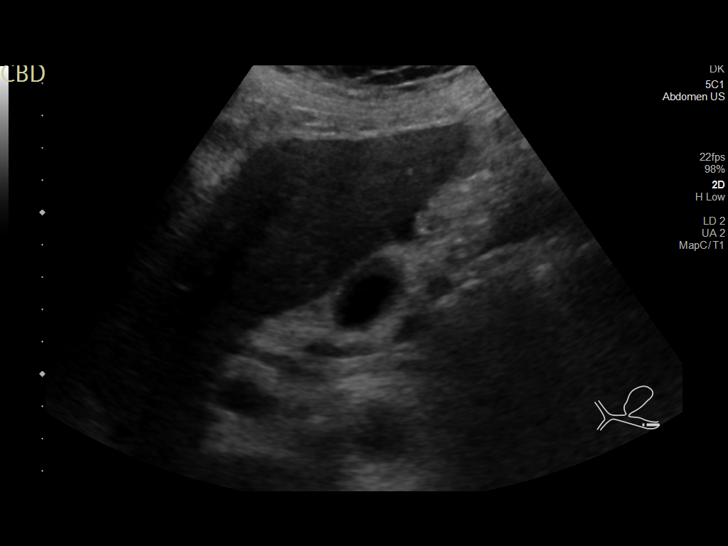
[im 25/30]
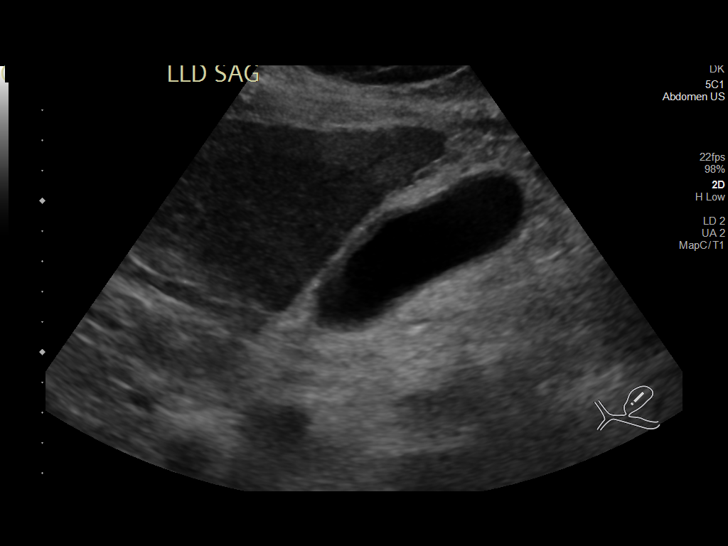
[im 27/30]
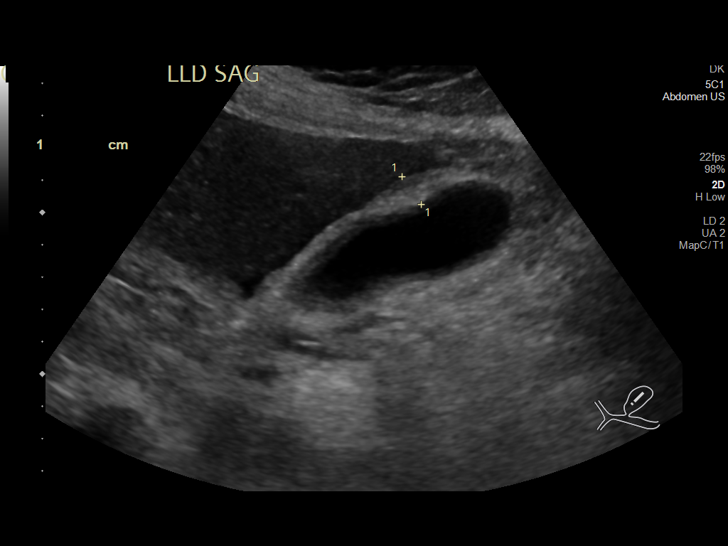
[im 30/30]
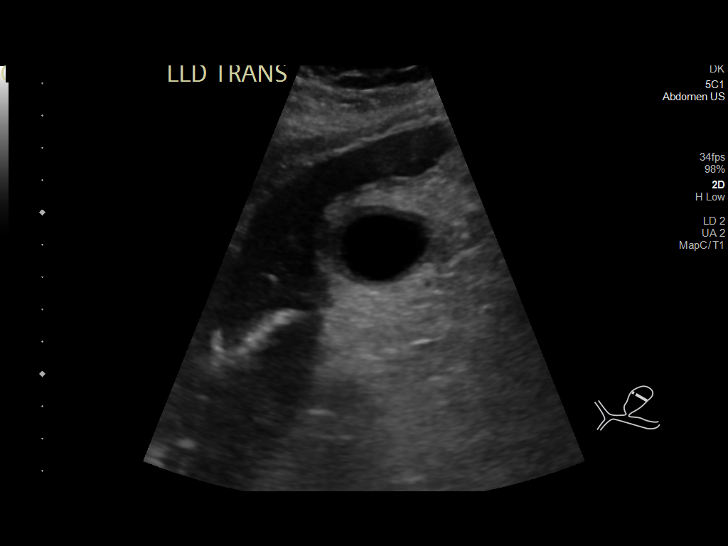

[14 of 25 positions shown; findings below may reference images not displayed]

FINDINGS: Gallbladder:

Diffusely thickened gallbladder wall measuring up to 1 cm. No
sonographic Murphy sign. Small amount of pericholecystic fluid is
seen.

Common bile duct:

Diameter: 4.4 mm

Liver:

No focal lesion identified. Within normal limits in parenchymal
echogenicity. Portal vein is patent on color Doppler imaging with
normal direction of blood flow towards the liver.

Other: None.
IMPRESSION: Diffusely thickened gallbladder wall with a small amount of
pericholecystic fluid. This is nonspecific, could be due to
acalculous cholecystitis

## 2020-03-24 MED ORDER — PIPERACILLIN-TAZOBACTAM 3.375 G IVPB 30 MIN
3.3750 g | Freq: Once | INTRAVENOUS | Status: AC
  Administered 2020-03-24: 3.375 g via INTRAVENOUS
  Filled 2020-03-24: qty 50

## 2020-03-24 MED ORDER — SODIUM CHLORIDE 0.9 % IV BOLUS
1000.0000 mL | Freq: Once | INTRAVENOUS | Status: AC
  Administered 2020-03-24: 1000 mL via INTRAVENOUS

## 2020-03-24 MED ORDER — ACETAMINOPHEN 500 MG PO TABS
1000.0000 mg | ORAL_TABLET | Freq: Once | ORAL | Status: AC
  Administered 2020-03-24: 19:00:00 1000 mg via ORAL
  Filled 2020-03-24: qty 2

## 2020-03-24 MED ORDER — ONDANSETRON 4 MG PO TBDP
4.0000 mg | ORAL_TABLET | Freq: Once | ORAL | Status: DC
  Filled 2020-03-24: qty 1

## 2020-03-24 MED ORDER — MORPHINE SULFATE (PF) 2 MG/ML IV SOLN
2.0000 mg | Freq: Once | INTRAVENOUS | Status: AC
  Administered 2020-03-25: 01:00:00 2 mg via INTRAVENOUS
  Filled 2020-03-24: qty 1

## 2020-03-24 MED ORDER — LACTATED RINGERS IV SOLN: INTRAVENOUS | Status: AC

## 2020-03-24 MED ORDER — LACTATED RINGERS IV BOLUS
1000.0000 mL | Freq: Once | INTRAVENOUS | Status: AC
  Administered 2020-03-24: 1000 mL via INTRAVENOUS

## 2020-03-24 NOTE — ED Notes (Signed)
Awaiting [facility] to transport  Dr Juleen China is aware

## 2020-03-24 NOTE — ED Notes (Signed)
Pt is to be transferred to [facility]  As ER- ER transfer Dr Harvie Junior accepting for [facility]   Call to pt son Thereasa Distance [phone] To return to get his mother   He is enroute

## 2020-03-24 NOTE — ED Notes (Signed)
Report to Filbert Schilder, RN, [facility] ED  [facility] enroute, pt and family informed   Awaiting transfer

## 2020-03-24 NOTE — ED Notes (Signed)
Date and time results received: 03/24/20 7:17 PM  (use smartphrase ".now" to insert current time)  Test: lactic acid Critical Value: 3.3  Name of Provider Notified: [place]  Orders Received? Or Actions Taken?: Orders Received - See Orders for details

## 2020-03-24 NOTE — ED Notes (Signed)
Continues on bedside commode  Spouse reports pt is not finished and will let know

## 2020-03-24 NOTE — ED Notes (Signed)
Pt and spouse appears very anxious  HOB elevated for pt  He then states he has to go to the bathroom Out of bed to bedside commode

## 2020-03-24 NOTE — ED Notes (Signed)
Pt spouse reports pt was not in [facility], but was [place] at the [facility] there

## 2020-03-24 NOTE — ED Triage Notes (Signed)
Patient c/o generalized abd pain with nausea and vomiting x1 week. Per patient seen at [facility] and discharged with diagnosis of GERD. Patient states symptoms got worse today. Per patient loose green stools today. Unsure of any fevers but hot to touch.

## 2020-03-24 NOTE — ED Provider Notes (Signed)
[facility] EMERGENCY DEPARTMENT Provider Note   CSN: [id number] Arrival date & time: 03/24/20  1800     History Chief Complaint  Patient presents with  . Abdominal Pain    Vincent Collins is a 79 y.o. male.  HPI   79 year old male with fatigue, nausea vomiting abdominal pain.  Symptoms began Monday.  Vague pain in the epigastric region and sometimes into his back and shoulders.  He went to an outside facility and was evaluated the emergency room.  Is diagnosed with possible reflux.  Symptoms initially seemed to improve somewhat after this visit until worsening again today.  Increasingly tired.  Subjective fevers.  Nauseated and vomited twice.  No change in his bowel movements.  No urinary complaints.  No sick contacts that he is aware of.  He has not received Covid vaccination.  Past Medical History:  Diagnosis Date  . Broken leg and ribs, right, closed, initial encounter   . COPD (chronic obstructive pulmonary disease) ([place])   . Diabetes mellitus without complication ([place])   . Diverticulitis   . GERD (gastroesophageal reflux disease)   . Hydronephrosis of left kidney   . Hyperlipidemia   . Hypertension   . Left ureteral stone    obstructing  . Renal calculus or stone 2015    Patient Active Problem List   Diagnosis Date Noted  . GERD (gastroesophageal reflux disease) 10/07/2017  . Hypertension associated with diabetes ([place]) 04/02/2011  . COPD (chronic obstructive pulmonary disease) ([place]) 04/02/2011  . BPH (benign prostatic hyperplasia) 04/02/2011  . Hyperlipidemia associated with type 2 diabetes mellitus ([place]) 04/02/2011  . Vitamin D deficiency 04/02/2011  . Type 2 diabetes mellitus ([place]) 04/02/2011    Past Surgical History:  Procedure Laterality Date  . 2d echo  04/04/09  . CYSTOSCOPY WITH RETROGRADE PYELOGRAM, URETEROSCOPY AND STENT PLACEMENT Left 10/11/2014   Procedure: CYSTOSCOPY WITH RETROGRADE PYELOGRAM, DIAGNOSTIC URETEROSCOPY AND STENT PLACEMENT;  Surgeon:  Alexis Frock, MD;  Location: [facility];  Service: Urology;  Laterality: Left;  . LUMBAR DISC SURGERY    . SPINE SURGERY       Family History  Problem Relation Age of Onset  . Early death Father        MI age 1  . Heart attack Father   . COPD Brother   . Cancer Sister     Social History   Tobacco Use  . Smoking status: Former Smoker    Types: Cigarettes    Quit date: 06/18/1967    Years since quitting: 52.8  . Smokeless tobacco: Former Systems developer    Types: Chew  Substance Use Topics  . Alcohol use: No  . Drug use: No    Home Medications Prior to Admission medications   Medication Sig Start Date End Date Taking? Authorizing Provider  ACCU-CHEK SOFTCLIX LANCETS lancets CHECK BLOOD SUGAR ONCE DAILY OR AS DIRECTED 09/28/17   Evelina Dun A, FNP  albuterol (PROAIR HFA) 108 (90 Base) MCG/ACT inhaler USE 2 PUFFS EVERY 6 HOURS  AS NEEDED FOR WHEEZING 05/25/18   Dettinger, Fransisca Kaufmann, MD  albuterol (PROVENTIL) (2.5 MG/3ML) 0.083% nebulizer solution Take 3 mLs (2.5 mg total) by nebulization 4 (four) times daily as needed for wheezing or shortness of breath. 10/26/19 01/24/20  Dettinger, Fransisca Kaufmann, MD  aspirin 81 MG EC tablet Take 81 mg by mouth daily.      [provider]  atorvastatin (LIPITOR) 40 MG tablet TAKE 1 TABLET BY MOUTH  DAILY 03/05/20   Dettinger,  Fransisca Kaufmann, MD  blood glucose meter kit and supplies Dispense based on patient and insurance preference. Use up to four times daily as directed. (FOR ICD-10 E10.9, E11.9). 10/26/19   Dettinger, Fransisca Kaufmann, MD  Blood Glucose Monitoring Suppl (ACCU-CHEK AVIVA CONNECT) w/Device KIT 1 kit daily by Does not apply route. 11/02/17   Dettinger, Fransisca Kaufmann, MD  Cholecalciferol (VITAMIN D) 1000 UNITS capsule Take 1,000 Units by mouth 2 (two) times daily.     [provider]  fluticasone (FLONASE) 50 MCG/ACT nasal spray USE 1 SPRAY INTO BOTH  NOSTRILS 2 TIMES DAILY AS  NEEDED FOR ALLERGIES OR  RHINITIS 03/05/20   Dettinger,  Fransisca Kaufmann, MD  gabapentin (NEURONTIN) 100 MG capsule Take 1-2 capsules (100-200 mg total) by mouth at bedtime. (for nerve pain) 12/26/19   Ronnie Doss M, DO  glucose blood (ACCU-CHEK AVIVA PLUS) test strip [place] to check blood sugars once daily 11/02/17   Dettinger, Fransisca Kaufmann, MD  HYDROcodone-acetaminophen (NORCO/VICODIN) 5-325 MG tablet Take 1 tablet by mouth every 6 (six) hours as needed for severe pain (if ibuprofen does not help). 01/04/20   Janora Norlander, DO  lidocaine (LIDODERM) 5 % Place 1 patch onto the skin daily. Remove & Discard patch within 12 hours or as directed by MD 12/29/19   Evalee Jefferson, PA-C  lisinopril (ZESTRIL) 20 MG tablet TAKE 1 TABLET BY MOUTH  DAILY 03/05/20   Dettinger, Fransisca Kaufmann, MD  LORazepam (ATIVAN) 0.5 MG tablet Take 1 tablet 1 hour before MRI.  May repeat 1 time if needed. 12/26/19   Ronnie Doss M, DO  metFORMIN (GLUCOPHAGE-XR) 500 MG 24 hr tablet TAKE 1 TABLET BY MOUTH TWO  TIMES DAILY 03/05/20   Dettinger, Fransisca Kaufmann, MD  metoprolol succinate (TOPROL-XL) 50 MG 24 hr tablet TAKE 1 TABLET BY MOUTH  DAILY WITH OR IMMEDIATELY  FOLLOWING A MEAL 03/05/20   Dettinger, Fransisca Kaufmann, MD  omeprazole (PRILOSEC) 20 MG capsule TAKE 1 CAPSULE BY MOUTH  DAILY 03/05/20   Dettinger, Fransisca Kaufmann, MD  predniSONE (STERAPRED UNI-PAK 48 TAB) 10 MG (48) TBPK tablet Take as directed for 12 days 12/16/19   Terald Sleeper, PA-C  tamsulosin (FLOMAX) 0.4 MG CAPS capsule TAKE 2 CAPSULES BY MOUTH  DAILY AFTER SUPPER 03/05/20   Dettinger, Fransisca Kaufmann, MD    Allergies    Diphenhydramine hcl  Review of Systems   Review of Systems All systems reviewed and negative, other than as noted in HPI.  Physical Exam Updated Vital Signs BP (!) 169/76 (BP Location: Left Arm)   Pulse (!) 126   Temp (!) 101.7 F (38.7 C) (Oral)   Resp (!) 27   Ht '5\' 8"'$  (1.727 m)   Wt 111.1 kg   SpO2 90%   BMI 37.25 kg/m   Physical Exam Vitals and nursing note reviewed.  Constitutional:      Appearance: He is  well-developed. He is obese.     Comments: Laying in bed.  Appears mildly uncomfortable, but nontoxic.  HENT:     Head: Normocephalic and atraumatic.  Eyes:     General:        Right eye: No discharge.        Left eye: No discharge.     Conjunctiva/sclera: Conjunctivae normal.  Cardiovascular:     Rate and Rhythm: Regular rhythm. Tachycardia present.     Heart sounds: Normal heart sounds. No murmur. No friction rub. No gallop.   Pulmonary:     Effort: Pulmonary effort  is normal. No respiratory distress.     Breath sounds: Normal breath sounds.  Abdominal:     General: Abdomen is protuberant. There is no distension.     Palpations: Abdomen is soft.     Tenderness: There is no abdominal tenderness.  Musculoskeletal:        General: No tenderness.     Cervical back: Neck supple.  Skin:    General: Skin is warm and dry.  Neurological:     Mental Status: He is alert.  Psychiatric:        Behavior: Behavior normal.        Thought Content: Thought content normal.    ED Results / Procedures / Treatments   Labs (all labs ordered are listed, but only abnormal results are displayed) Labs Reviewed  COMPREHENSIVE METABOLIC PANEL - Abnormal; Notable for the following components:      Result Value   CO2 20 (*)    Glucose, Bld 226 (*)    Calcium 8.3 (*)    AST 352 (*)    ALT 255 (*)    Alkaline Phosphatase 195 (*)    Total Bilirubin 4.2 (*)    All other components within normal limits  LACTIC ACID, PLASMA - Abnormal; Notable for the following components:   Lactic Acid, Venous 3.3 (*)    All other components within normal limits  CBC WITH DIFFERENTIAL/PLATELET - Abnormal; Notable for the following components:   Lymphs Abs 0.5 (*)    Monocytes Absolute 0.0 (*)    All other components within normal limits  CULTURE, BLOOD (ROUTINE X 2)  CULTURE, BLOOD (ROUTINE X 2)  RESPIRATORY PANEL BY RT PCR (FLU A&B, COVID)  PROTIME-INR  LACTIC ACID, PLASMA  URINALYSIS, ROUTINE W REFLEX  MICROSCOPIC  POC SARS CORONAVIRUS 2 AG -  ED    EKG None  Radiology DG Chest Port 1 View  Result Date: 03/24/2020 CLINICAL DATA:  Sepsis. EXAM: PORTABLE CHEST 1 VIEW COMPARISON:  January 04, 2020 FINDINGS: Heart size is enlarged. There is no pneumothorax or pleural effusion. There are airspace opacities at the lung bases bilaterally favored to represent areas of scarring atelectasis. There is no acute osseous abnormality. IMPRESSION: 1. Cardiomegaly. 2. Bibasilar airspace opacities favored to represent scarring atelectasis. Electronically Signed   By: Constance Holster M.D.   On: 03/24/2020 18:50    Procedures Procedures (including critical care time)  Medications Ordered in ED Medications  piperacillin-tazobactam (ZOSYN) IVPB 3.375 g (3.375 g Intravenous New Bag/Given 03/24/20 1933)  sodium chloride 0.9 % bolus 1,000 mL (0 mLs Intravenous Stopped 03/24/20 1944)  acetaminophen (TYLENOL) tablet 1,000 mg (1,000 mg Oral Given 03/24/20 1853)  lactated ringers bolus 1,000 mL (1,000 mLs Intravenous New Bag/Given 03/24/20 1932)    ED Course  I have reviewed the triage vital signs and the nursing notes.  Pertinent labs & imaging results that were available during my care of the patient were reviewed by me and considered in my medical decision making (see chart for details).    MDM Rules/Calculators/A&P                      79 year old male with vague abdominal pain and nausea/vomiting.  He was just seen in the emergency room at [facility] in [place].  He was diagnosed with possible GERD.  Looks like he had a fairly extensive work-up including CBC, metabolic panel, troponin x2 and CT angiography of the chest/abdomen/pelvis.  They were all unrevealing.  He states that initially  began to feel little bit better until symptoms worsened this afternoon.  Increasingly nauseated and vomited twice. Has no energt.  His abdominal exam is pretty benign.  He denies any dyspnea although he is requiring 2 L to  keep his oxygen saturations in the 90s and he does not have a baseline oxygen requirement.  I suspect that he likely has COVID.  Will obtain a chest x-ray.  With a benign abdominal exam I do not feel that he needs repeat CT scan at this time.  Blood work including lactic acid and blood cultures.  No specific urinary complaints and pain is more in the upper abdomen, but for the sake of thoroughness we will also check a urinalysis.  Given this oxygen requirement, will need admitted.  LFTs abnormal and changed from ED visit on 03/21/20. Consider cholecystitis/cholangitis/choledocholithiasis with upper abdominal pain going into the back. Still could be COVID despite antigen testing being negative. Abnormal LFTs can be seen with COVID and CXR and hypoxemia are also consistent.  Again, I find his abdominal exam to be pretty unremarkable. Recent CTa of chest/abdomen as below didn't specifically mention GB/liver disease. [place] would be better test but unavailable at [facility] at this hour. Given a dose of zosyn to cover for the time being.  Discussed with hospitalist, Dr Volanda Napoleon. He feels that pt needs transferred to [place] for [place] and then hospitalist service consulted and possibly GI if indicated. Discussed with Dr Johnney Killian, EDP at [facility]. Pt/wife updated.   Imaging:  CT ANGIO CHEST/ABDOMEN/PELVIS  Narrative:  CTA of the chest abdomen pelvis  HISTORY: Chest pain, evaluate for aortic dissection  Following the IV administration of 100 cc Isovue-370 IV contrast 0.5 mm axial sections were obtained from the thoracic inlet through the pubic symphysis with reconstruction in the sagittal and coronal planes.  Maximum intensity projection 3-D reconstructions were performed and interpreted on a separate workstation.  The thoracic inlet is grossly unremarkable.  There is no axillary adenopathy.  The central airways are patent.  Evaluation of the pulmonary arteries demonstrate no vascular filling defect to  suggest pulmonary emboli.  There is no acute infiltrate. There is no pleural effusion.  The heart is within normal limits.  There is no evidence of aortic aneurysm or aortic dissection..  The celiac axis and its proximal branches are unremarkable. The superior mesenteric artery is unremarkable.  The bilateral renal arteries are patent.  The inferior mesenteric artery is identified without abnormality.  The bilateral common iliac internal and external iliac arteries are grossly unremarkable.  The liver demonstrates no gross of the mildly.  There is no radiopaque gallbladder calculi.  The spleen and the pancreas are within normal limits.  There are no adrenal nodules.  There is no evidence of renal calculi or hydronephrosis. There are bilateral renal cysts.  There is no evidence of small bowel obstruction.  The appendix is unremarkable.  The colon is poorly evaluated due to the lack of oral contrast and poor distention. There are multiple diverticuli throughout the colon without colonic wall thickening or pericolonic stranding.  There is a fat-containing ventral abdominal hernia.  Urinary bladder is unremarkable.  The osseous structures demonstrate chronic appearing fractures of the anterior aspect of the right seventh and eighth ribs.  Impression:  IMPRESSION:  No evidence of pulmonary emboli no evidence of aortic aneurysm or aortic dissection.  No acute infiltrate or pleural effusion.  No evidence of bowel obstruction free fluid or inflammatory stranding.  Electronically Signed by: Simona Huh  Maryjo [place]  Final Clinical Impression(s) / ED Diagnoses Final diagnoses:  Abdominal pain, RUQ  Fever, unspecified    Rx / DC Orders ED Discharge Orders    None       Virgel Manifold, MD 03/25/20 1524

## 2020-03-24 NOTE — ED Notes (Signed)
Unable to obtain urine sample due to stool contamination.

## 2020-03-24 NOTE — ED Notes (Signed)
Report to [facility]   POC- Homero Fellers

## 2020-03-24 NOTE — ED Notes (Signed)
Pt started in hallway upon arrival, NAD noted.

## 2020-03-24 NOTE — ED Notes (Signed)
Pt here from [facility] with complaint of feeling badly this afternoon after eating chicken tenders and lying down   He is warm to touch  Has a hx of copd and GERD

## 2020-03-24 NOTE — ED Notes (Signed)
Call from lab   Second lactic   2.7  Dr Juleen China informed

## 2020-03-24 NOTE — ED Provider Notes (Signed)
Patient transferred from [facility].  Briefly 79 yo M with 2 days of RUQ abdominal pain, fever.  Found to have significant LFT elevation.  Was given a dose of Zosyn in case was discussed with hospitalist there.  Ultrasound is not available at [facility] and so was sent here for ultrasound.  I did discuss the case with Dr. Evette Cristal, gastroenterology prior to going up to ultrasound.  He did recommend having an ultrasound performed initially.  Primary looking for stones or acute cholecystitis.  This was negative for stones but there is some concern for a calculus cholecystitis with a dilated gallbladder wall and pericholecystic fluid.  Interestingly the common bile duct was normal.  I then discussed this with Dr. Janee Morn, general surgery.  He felt this was unlikely to represent primary gallbladder pathology and felt the patient may need a HIDA scan though they would evaluate the patient in the morning likely post MRCP.  Will discuss with hospitalist for admission.   Melene Plan, DO 03/25/20 0041

## 2020-03-25 ENCOUNTER — Encounter (HOSPITAL_COMMUNITY): Payer: Self-pay | Admitting: Family Medicine

## 2020-03-25 ENCOUNTER — Inpatient Hospital Stay (HOSPITAL_COMMUNITY): Payer: Medicare Other

## 2020-03-25 DIAGNOSIS — Z20822 Contact with and (suspected) exposure to covid-19: Secondary | ICD-10-CM | POA: Diagnosis not present

## 2020-03-25 DIAGNOSIS — R05 Cough: Secondary | ICD-10-CM | POA: Diagnosis not present

## 2020-03-25 DIAGNOSIS — K5730 Diverticulosis of large intestine without perforation or abscess without bleeding: Secondary | ICD-10-CM | POA: Diagnosis present

## 2020-03-25 DIAGNOSIS — J449 Chronic obstructive pulmonary disease, unspecified: Secondary | ICD-10-CM | POA: Diagnosis not present

## 2020-03-25 DIAGNOSIS — K851 Biliary acute pancreatitis without necrosis or infection: Secondary | ICD-10-CM | POA: Diagnosis not present

## 2020-03-25 DIAGNOSIS — K219 Gastro-esophageal reflux disease without esophagitis: Secondary | ICD-10-CM | POA: Diagnosis present

## 2020-03-25 DIAGNOSIS — E119 Type 2 diabetes mellitus without complications: Secondary | ICD-10-CM | POA: Diagnosis not present

## 2020-03-25 DIAGNOSIS — E876 Hypokalemia: Secondary | ICD-10-CM | POA: Diagnosis present

## 2020-03-25 DIAGNOSIS — I071 Rheumatic tricuspid insufficiency: Secondary | ICD-10-CM | POA: Diagnosis present

## 2020-03-25 DIAGNOSIS — E1169 Type 2 diabetes mellitus with other specified complication: Secondary | ICD-10-CM

## 2020-03-25 DIAGNOSIS — R112 Nausea with vomiting, unspecified: Secondary | ICD-10-CM | POA: Diagnosis not present

## 2020-03-25 DIAGNOSIS — N400 Enlarged prostate without lower urinary tract symptoms: Secondary | ICD-10-CM | POA: Diagnosis not present

## 2020-03-25 DIAGNOSIS — A4151 Sepsis due to Escherichia coli [E. coli]: Secondary | ICD-10-CM | POA: Diagnosis not present

## 2020-03-25 DIAGNOSIS — I10 Essential (primary) hypertension: Secondary | ICD-10-CM | POA: Diagnosis not present

## 2020-03-25 DIAGNOSIS — K859 Acute pancreatitis without necrosis or infection, unspecified: Secondary | ICD-10-CM | POA: Diagnosis not present

## 2020-03-25 DIAGNOSIS — R1011 Right upper quadrant pain: Secondary | ICD-10-CM

## 2020-03-25 DIAGNOSIS — K429 Umbilical hernia without obstruction or gangrene: Secondary | ICD-10-CM | POA: Diagnosis present

## 2020-03-25 DIAGNOSIS — A419 Sepsis, unspecified organism: Secondary | ICD-10-CM | POA: Diagnosis not present

## 2020-03-25 DIAGNOSIS — I4891 Unspecified atrial fibrillation: Secondary | ICD-10-CM | POA: Diagnosis not present

## 2020-03-25 DIAGNOSIS — Z87442 Personal history of urinary calculi: Secondary | ICD-10-CM | POA: Diagnosis not present

## 2020-03-25 DIAGNOSIS — K828 Other specified diseases of gallbladder: Secondary | ICD-10-CM | POA: Diagnosis not present

## 2020-03-25 DIAGNOSIS — K5900 Constipation, unspecified: Secondary | ICD-10-CM | POA: Diagnosis present

## 2020-03-25 DIAGNOSIS — I493 Ventricular premature depolarization: Secondary | ICD-10-CM | POA: Diagnosis present

## 2020-03-25 DIAGNOSIS — E669 Obesity, unspecified: Secondary | ICD-10-CM | POA: Diagnosis present

## 2020-03-25 DIAGNOSIS — R6520 Severe sepsis without septic shock: Secondary | ICD-10-CM | POA: Diagnosis not present

## 2020-03-25 DIAGNOSIS — K8591 Acute pancreatitis with uninfected necrosis, unspecified: Secondary | ICD-10-CM | POA: Diagnosis not present

## 2020-03-25 DIAGNOSIS — K819 Cholecystitis, unspecified: Secondary | ICD-10-CM | POA: Diagnosis not present

## 2020-03-25 DIAGNOSIS — E785 Hyperlipidemia, unspecified: Secondary | ICD-10-CM | POA: Diagnosis not present

## 2020-03-25 DIAGNOSIS — K8309 Other cholangitis: Secondary | ICD-10-CM | POA: Diagnosis present

## 2020-03-25 DIAGNOSIS — R0601 Orthopnea: Secondary | ICD-10-CM | POA: Diagnosis not present

## 2020-03-25 DIAGNOSIS — R1013 Epigastric pain: Secondary | ICD-10-CM | POA: Diagnosis present

## 2020-03-25 DIAGNOSIS — Z87891 Personal history of nicotine dependence: Secondary | ICD-10-CM | POA: Diagnosis not present

## 2020-03-25 DIAGNOSIS — R509 Fever, unspecified: Secondary | ICD-10-CM | POA: Diagnosis not present

## 2020-03-25 DIAGNOSIS — I480 Paroxysmal atrial fibrillation: Secondary | ICD-10-CM | POA: Diagnosis not present

## 2020-03-25 DIAGNOSIS — Z6837 Body mass index (BMI) 37.0-37.9, adult: Secondary | ICD-10-CM | POA: Diagnosis not present

## 2020-03-25 DIAGNOSIS — R0602 Shortness of breath: Secondary | ICD-10-CM | POA: Diagnosis not present

## 2020-03-25 LAB — CBG MONITORING, ED
Glucose-Capillary: 185 mg/dL — ABNORMAL HIGH (ref 70–99)
Glucose-Capillary: 148 mg/dL — ABNORMAL HIGH (ref 70–99)
Glucose-Capillary: 118 mg/dL — ABNORMAL HIGH (ref 70–99)
Glucose-Capillary: 117 mg/dL — ABNORMAL HIGH (ref 70–99)

## 2020-03-25 LAB — GLUCOSE, CAPILLARY
Glucose-Capillary: 100 mg/dL — ABNORMAL HIGH (ref 70–99)
Glucose-Capillary: 127 mg/dL — ABNORMAL HIGH (ref 70–99)

## 2020-03-25 LAB — COMPREHENSIVE METABOLIC PANEL
ALT: 258 U/L — ABNORMAL HIGH (ref 0–44)
AST: 266 U/L — ABNORMAL HIGH (ref 15–41)
Albumin: 2.7 g/dL — ABNORMAL LOW (ref 3.5–5.0)
Alkaline Phosphatase: 145 U/L — ABNORMAL HIGH (ref 38–126)
Anion gap: 12 (ref 5–15)
BUN: 19 mg/dL (ref 8–23)
CO2: 22 mmol/L (ref 22–32)
Calcium: 8.1 mg/dL — ABNORMAL LOW (ref 8.9–10.3)
Chloride: 104 mmol/L (ref 98–111)
Creatinine, Ser: 1.49 mg/dL — ABNORMAL HIGH (ref 0.61–1.24)
GFR calc Af Amer: 52 mL/min — ABNORMAL LOW (ref >60)
GFR calc non Af Amer: 45 mL/min — ABNORMAL LOW (ref >60)
Glucose, Bld: 178 mg/dL — ABNORMAL HIGH (ref 70–99)
Potassium: 3.3 mmol/L — ABNORMAL LOW (ref 3.5–5.1)
Sodium: 138 mmol/L (ref 135–145)
Total Bilirubin: 5.1 mg/dL — ABNORMAL HIGH (ref 0.3–1.2)
Total Protein: 5.3 g/dL — ABNORMAL LOW (ref 6.5–8.1)

## 2020-03-25 LAB — BLOOD CULTURE ID PANEL (REFLEXED)

## 2020-03-25 LAB — URINALYSIS, ROUTINE W REFLEX MICROSCOPIC
Bacteria, UA: NONE SEEN
Bilirubin Urine: NEGATIVE
Glucose, UA: NEGATIVE mg/dL
Hgb urine dipstick: NEGATIVE
Ketones, ur: NEGATIVE mg/dL
Leukocytes,Ua: NEGATIVE
Nitrite: NEGATIVE
Protein, ur: 30 mg/dL — AB
Specific Gravity, Urine: 1.023 (ref 1.005–1.030)
pH: 5 (ref 5.0–8.0)

## 2020-03-25 LAB — CBC WITH DIFFERENTIAL/PLATELET
Abs Immature Granulocytes: 0.21 10*3/uL — ABNORMAL HIGH (ref 0.00–0.07)
Basophils Absolute: 0.1 10*3/uL (ref 0.0–0.1)
Basophils Relative: 0 %
Eosinophils Absolute: 0 10*3/uL (ref 0.0–0.5)
Eosinophils Relative: 0 %
HCT: 44.7 % (ref 39.0–52.0)
Hemoglobin: 15 g/dL (ref 13.0–17.0)
Immature Granulocytes: 1 %
Lymphocytes Relative: 2 %
Lymphs Abs: 0.4 10*3/uL — ABNORMAL LOW (ref 0.7–4.0)
MCH: 29.9 pg (ref 26.0–34.0)
MCHC: 33.6 g/dL (ref 30.0–36.0)
MCV: 89 fL (ref 80.0–100.0)
Monocytes Absolute: 1 10*3/uL (ref 0.1–1.0)
Monocytes Relative: 5 %
Neutro Abs: 18.7 10*3/uL — ABNORMAL HIGH (ref 1.7–7.7)
Neutrophils Relative %: 92 %
Platelets: 161 10*3/uL (ref 150–400)
RBC: 5.02 MIL/uL (ref 4.22–5.81)
RDW: 13.2 % (ref 11.5–15.5)
WBC Morphology: INCREASED
WBC: 20.4 10*3/uL — ABNORMAL HIGH (ref 4.0–10.5)
nRBC: 0 % (ref 0.0–0.2)

## 2020-03-25 LAB — PROTIME-INR
INR: 1.1 (ref 0.8–1.2)
Prothrombin Time: 14.3 seconds (ref 11.4–15.2)

## 2020-03-25 LAB — HEMOGLOBIN A1C
Hgb A1c MFr Bld: 6.9 % — ABNORMAL HIGH (ref 4.8–5.6)
Mean Plasma Glucose: 151.33 mg/dL

## 2020-03-25 LAB — LIPASE, BLOOD: Lipase: 1476 U/L — ABNORMAL HIGH (ref 11–51)

## 2020-03-25 LAB — HEPATITIS PANEL, ACUTE
HCV Ab: NONREACTIVE
Hep A IgM: NONREACTIVE
Hep B C IgM: NONREACTIVE
Hepatitis B Surface Ag: NONREACTIVE

## 2020-03-25 LAB — LACTIC ACID, PLASMA
Lactic Acid, Venous: 2.1 mmol/L (ref 0.5–1.9)
Lactic Acid, Venous: 3 mmol/L (ref 0.5–1.9)
Lactic Acid, Venous: 2 mmol/L (ref 0.5–1.9)
Lactic Acid, Venous: 1.6 mmol/L (ref 0.5–1.9)

## 2020-03-25 LAB — TRIGLYCERIDES: Triglycerides: 76 mg/dL (ref <150)

## 2020-03-25 LAB — MAGNESIUM: Magnesium: 1.2 mg/dL — ABNORMAL LOW (ref 1.7–2.4)

## 2020-03-25 IMAGING — MR MR ABDOMEN WO/W CM MRCP
20 of 22 series · 45 of 48 positions shown · IV contrast (Gadavist)
Comparison: Abdominal sonogram from earlier today. [DATE] CT
abdomen/pelvis.

CLINICAL DATA: Jaundice. Abdominal pain, nausea and vomiting.
Chills. COPD.

EXAM:
MRI ABDOMEN WITHOUT AND WITH CONTRAST (INCLUDING MRCP)
TECHNIQUE: Multiplanar multisequence MR imaging of the abdomen was performed
both before and after the administration of intravenous contrast.
Heavily T2-weighted images of the biliary and pancreatic ducts were
obtained, and three-dimensional MRCP images were rendered by post
processing.
CONTRAST:  10mL GADAVIST GADOBUTROL 1 MMOL/ML IV SOLN

[Series 4: bSSFP · coronal · 6.0mm · 0.78mm/px · 1 of 40 slices shown (1 of 2)]
[im 1/40]
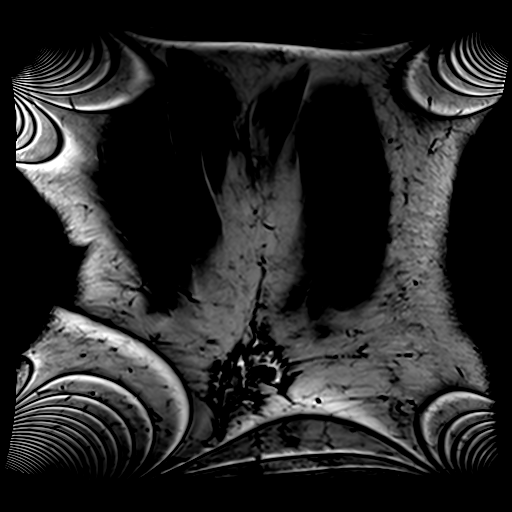

[Series 5: ax haste · axial · 6.0mm · 1.31mm/px · 1 of 36 slices shown]
[im 1/36]
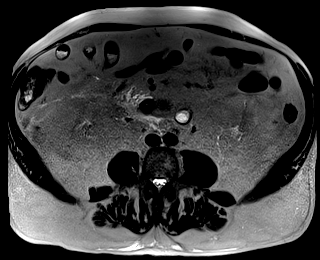

[Series 6: DWI · axial · 6.0mm · 1.49mm/px · z∈[-98,+110]mm · 2 of 90 slices shown (1 of 2)]
[im 1/90]
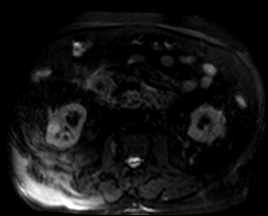
[im 90/90]
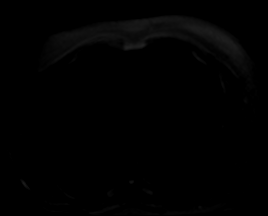

[Series 7: DWI · axial · 6.0mm · 1.49mm/px · 1 of 30 slices shown (2 of 2)]
[im 1/30]
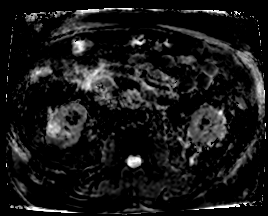

[Series 14: T2 fat-sat · axial · 6.0mm · 1.25mm/px · 1 of 36 slices shown]
[im 1/36]
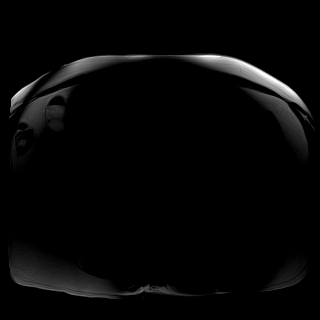

[Series 16: bSSFP · coronal · 6.0mm · 0.78mm/px · 1 of 34 slices shown (2 of 2)]
[im 1/34]
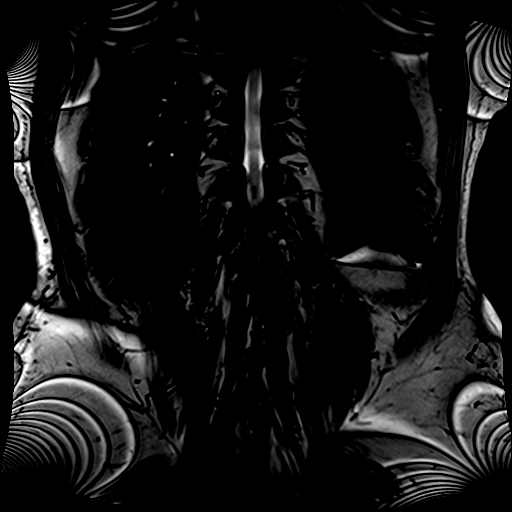

[Series 17: ax in and · axial · 3.0mm · 1.31mm/px · z∈[-161,+100]mm · 3 of 88 slices shown (1 of 2)]
[im 1/88]
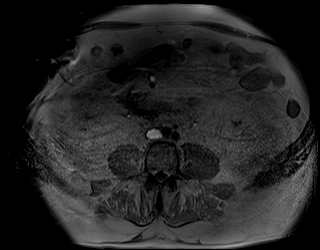
[im 44/88]
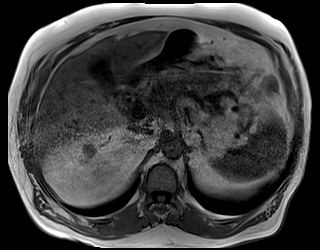
[im 88/88]
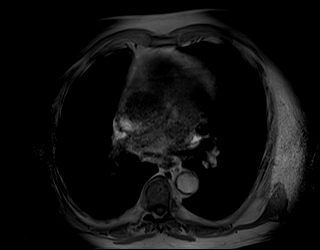

[Series 17: ax in and · axial · 3.0mm · 1.31mm/px · z∈[-161,+100]mm · 3 of 88 slices shown (2 of 2)]
[im 1/88]
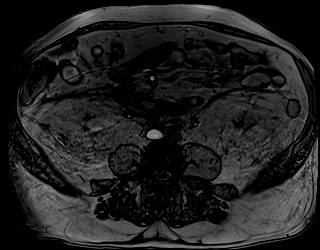
[im 44/88]
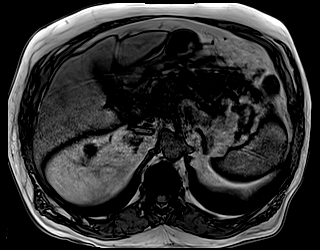
[im 88/88]
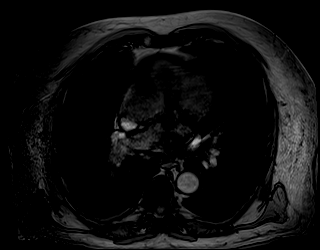

[Series 18: MRCP · coronal · 4.0mm · 1.12mm/px · 1 of 15 slices shown]
[im 1/15]
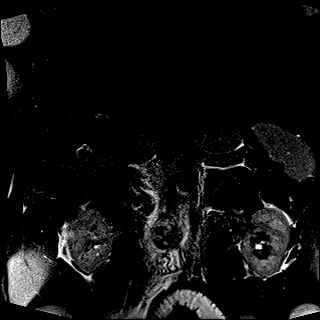

[Series 19: radials · coronal · 50.0mm · 0.78mm/px · 1 of 5 slices shown]
[im 1/5]
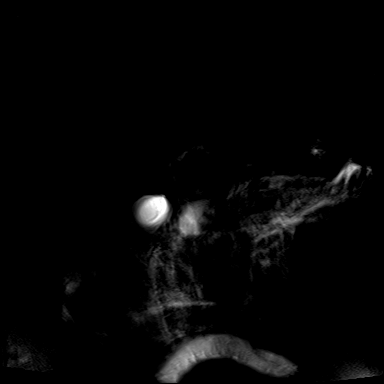

[Series 20: T1 dynamic · axial · non-contrast · 3.0mm · 1.31mm/px · z∈[-146,+91]mm · 3 of 80 slices shown (1 of 5)]
[im 1/80]
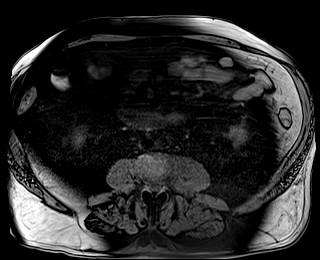
[im 40/80]
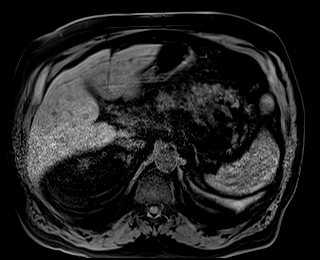
[im 80/80]
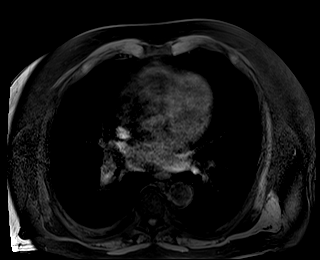

[Series 22: T1 dynamic post-contrast · axial · 3.0mm · 1.31mm/px · z∈[-146,+91]mm · 3 of 80 slices shown (1 of 5)]
[im 1/80]
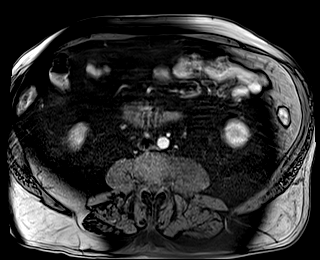
[im 40/80]
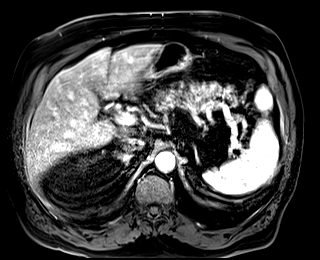
[im 80/80]
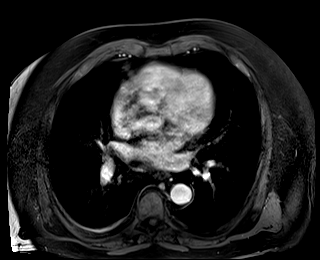

[Series 23: T1 dynamic · axial · 3.0mm · 1.31mm/px · z∈[-146,+91]mm · 3 of 80 slices shown (2 of 5)]
[im 1/80]
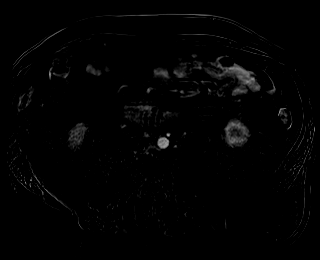
[im 40/80]
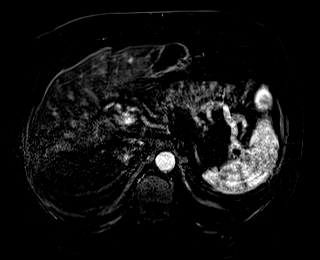
[im 80/80]
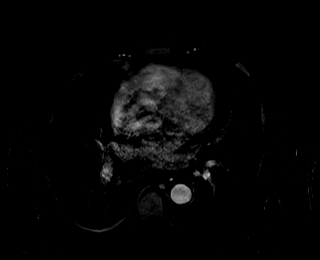

[Series 24: T1 dynamic post-contrast · axial · 3.0mm · 1.31mm/px · z∈[-146,+91]mm · 3 of 80 slices shown (2 of 5)]
[im 1/80]
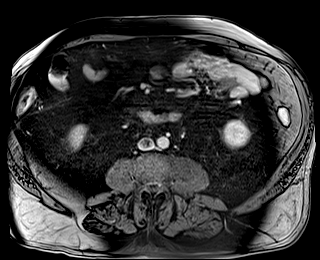
[im 40/80]
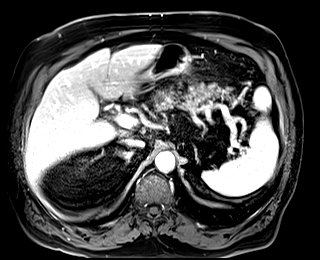
[im 80/80]
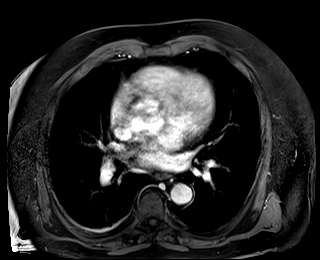

[Series 25: T1 dynamic · axial · 3.0mm · 1.31mm/px · z∈[-146,+91]mm · 3 of 80 slices shown (3 of 5)]
[im 1/80]
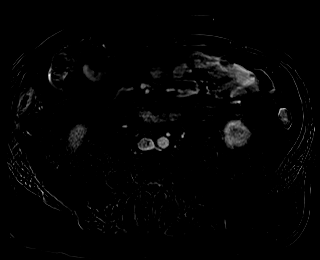
[im 40/80]
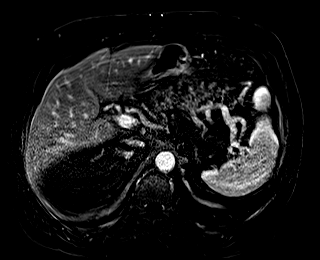
[im 80/80]
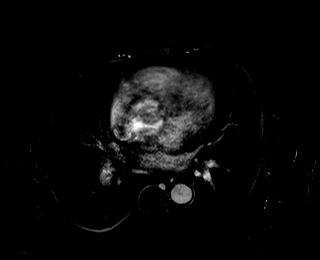

[Series 26: T1 dynamic post-contrast · axial · 3.0mm · 1.31mm/px · z∈[-146,+91]mm · 3 of 80 slices shown (3 of 5)]
[im 1/80]
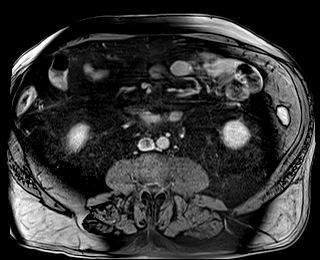
[im 40/80]
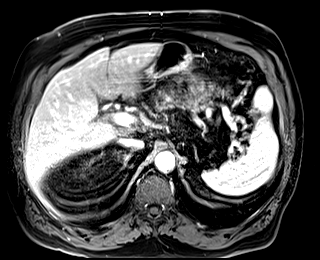
[im 80/80]
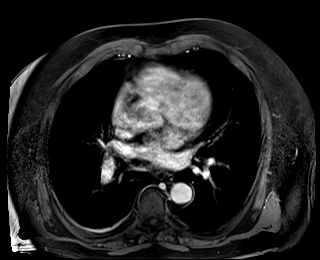

[Series 27: T1 dynamic · axial · 3.0mm · 1.31mm/px · z∈[-146,+91]mm · 3 of 80 slices shown (4 of 5)]
[im 1/80]
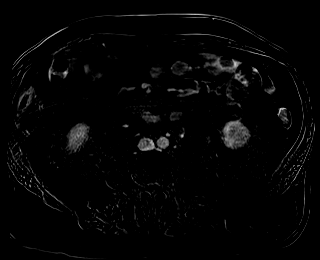
[im 40/80]
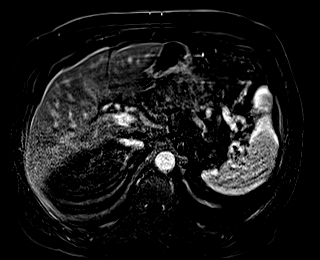
[im 80/80]
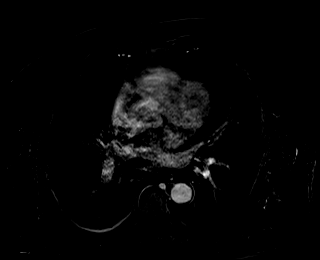

[Series 28: T1 dynamic post-contrast · axial · 3.0mm · 1.31mm/px · z∈[-146,+91]mm · 3 of 80 slices shown (4 of 5)]
[im 1/80]
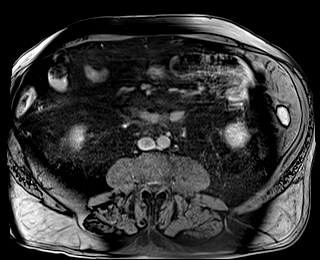
[im 40/80]
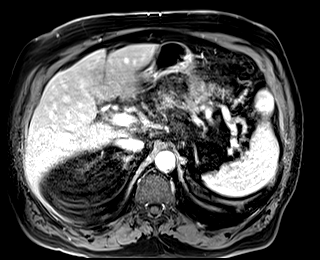
[im 80/80]
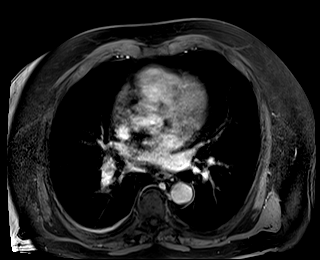

[Series 29: T1 dynamic · axial · 3.0mm · 1.31mm/px · z∈[-146,+91]mm · 3 of 80 slices shown (5 of 5)]
[im 1/80]
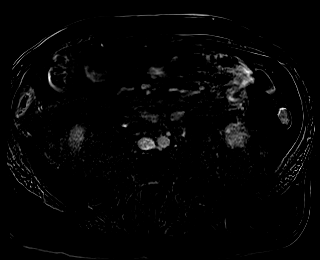
[im 40/80]
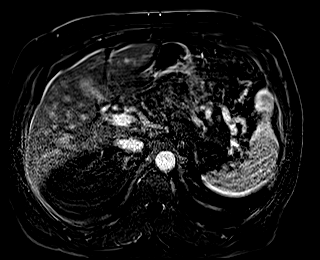
[im 80/80]
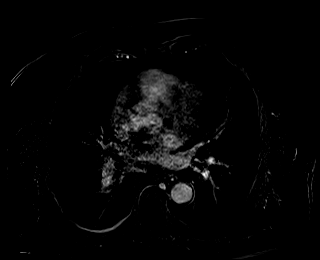

[Series 30: T1 dynamic post-contrast · coronal · 3.0mm · 1.31mm/px · 3 of 80 slices shown (5 of 5)]
[im 1/80]
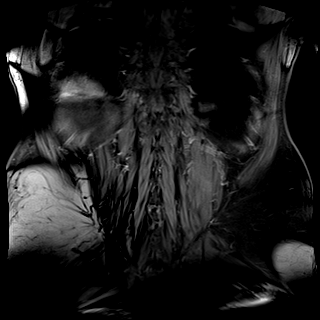
[im 40/80]
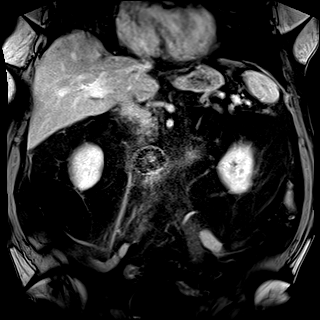
[im 80/80]
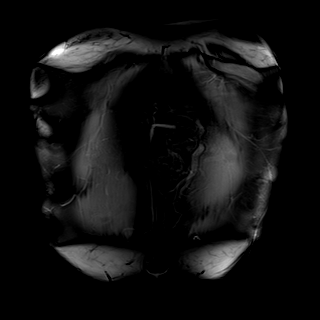

[45 of 48 positions shown; findings below may reference images not displayed]

FINDINGS: Lower chest: No acute abnormality at the lung bases.

Hepatobiliary: Normal liver size and configuration. Mild diffuse
hepatic steatosis. Scattered simple subcentimeter left liver lobe
cysts. No suspicious liver masses. Moderate diffuse gallbladder wall
thickening. No gallstones. Gallbladder is nondistended. No
significant pericholecystic fluid. No biliary ductal dilatation.
Common bile duct diameter 4 mm. No biliary filling defects to
suggest choledocholithiasis. No biliary strictures, masses or
beading.

Pancreas: Diffuse pancreatic parenchymal and peripancreatic edema
compatible with acute pancreatitis. No pancreatic duct dilation. No
pancreas divisum. Preserved pancreatic parenchymal enhancement. Tiny
nonenhancing 0.5 cm pancreatic tail cystic lesion compatible with a
pseudocyst (series 18/image 11). No additional pancreatic lesions.
No measurable peripancreatic fluid collections.

Spleen: Normal size. No mass.

Adrenals/Urinary Tract: Normal adrenals. No hydronephrosis.
Exophytic minimally complex renal cysts in the upper left kidney
measuring 1.7 cm medially (series 5/image 24) and 2.1 cm posteriorly
(series 5/image 26), both demonstrating a tiny amount of layering
hemorrhagic/proteinaceous material without enhancement, compatible
with Bosniak category 2 hemorrhagic/proteinaceous renal cysts.
Additional simple renal cysts scattered in both kidneys, largest
cm in the interpolar right kidney. No suspicious renal masses.

Stomach/Bowel: Normal non-distended stomach. Visualized small and
large bowel is normal caliber, with no bowel wall thickening.
Colonic diverticulosis.

Vascular/Lymphatic: Atherosclerotic nonaneurysmal abdominal aorta.
Patent portal, splenic, hepatic and renal veins. No pathologically
enlarged lymph nodes in the abdomen.

Other: No abdominal ascites or focal fluid collection.

Musculoskeletal: No aggressive appearing focal osseous lesions.
IMPRESSION: 1. Acute non-necrotizing pancreatitis. No cholelithiasis or
choledocholithiasis. No biliary or pancreatic duct dilation. Tiny
0.5 cm pancreatic tail pseudocyst. No measurable peripancreatic
fluid collections.
2. Moderate diffuse gallbladder wall thickening, nonspecific,
probably reactive or due to noninflammatory edema.
3. Mild diffuse hepatic steatosis.
4. Colonic diverticulosis.
5. Bosniak category 1 and category 2 renal cysts. No suspicious
renal masses.
6.  Aortic Atherosclerosis ([JA]-[JA]).

## 2020-03-25 MED ORDER — ACETAMINOPHEN 325 MG PO TABS: 650.0000 mg | ORAL_TABLET | Freq: Four times a day (QID) | ORAL | Status: DC | PRN

## 2020-03-25 MED ORDER — METRONIDAZOLE IN NACL 5-0.79 MG/ML-% IV SOLN
500.0000 mg | Freq: Three times a day (TID) | INTRAVENOUS | Status: DC
  Administered 2020-03-25 – 2020-03-27 (×6): 500 mg via INTRAVENOUS
  Filled 2020-03-25 (×6): qty 100

## 2020-03-25 MED ORDER — PIPERACILLIN-TAZOBACTAM 3.375 G IVPB
3.3750 g | Freq: Three times a day (TID) | INTRAVENOUS | Status: DC
  Administered 2020-03-25: 3.375 g via INTRAVENOUS
  Filled 2020-03-25: qty 50

## 2020-03-25 MED ORDER — SODIUM CHLORIDE 0.9 % IV BOLUS
500.0000 mL | Freq: Once | INTRAVENOUS | Status: AC
  Administered 2020-03-25: 06:00:00 500 mL via INTRAVENOUS

## 2020-03-25 MED ORDER — SODIUM CHLORIDE 0.9% FLUSH
3.0000 mL | Freq: Two times a day (BID) | INTRAVENOUS | Status: DC
  Administered 2020-03-26 – 2020-03-30 (×8): 3 mL via INTRAVENOUS

## 2020-03-25 MED ORDER — ACETAMINOPHEN 325 MG PO TABS
650.0000 mg | ORAL_TABLET | Freq: Four times a day (QID) | ORAL | Status: DC | PRN
  Administered 2020-03-27: 21:00:00 650 mg via ORAL
  Filled 2020-03-25: qty 2

## 2020-03-25 MED ORDER — MAGNESIUM SULFATE 50 % IJ SOLN
3.0000 g | Freq: Once | INTRAVENOUS | Status: AC
  Administered 2020-03-25: 3 g via INTRAVENOUS
  Filled 2020-03-25: qty 6

## 2020-03-25 MED ORDER — SODIUM CHLORIDE 0.9 % IV SOLN
2.0000 g | INTRAVENOUS | Status: DC
  Administered 2020-03-25 – 2020-03-26 (×2): 2 g via INTRAVENOUS
  Filled 2020-03-25: qty 2
  Filled 2020-03-25 (×2): qty 20

## 2020-03-25 MED ORDER — ALBUTEROL SULFATE (2.5 MG/3ML) 0.083% IN NEBU: 2.5000 mg | INHALATION_SOLUTION | Freq: Four times a day (QID) | RESPIRATORY_TRACT | Status: DC | PRN

## 2020-03-25 MED ORDER — INSULIN ASPART 100 UNIT/ML ~~LOC~~ SOLN
0.0000 [IU] | SUBCUTANEOUS | Status: DC
  Administered 2020-03-25: 2 [IU] via SUBCUTANEOUS
  Administered 2020-03-25 – 2020-03-26 (×7): 1 [IU] via SUBCUTANEOUS
  Administered 2020-03-27 (×2): 2 [IU] via SUBCUTANEOUS
  Administered 2020-03-28 (×3): 1 [IU] via SUBCUTANEOUS
  Administered 2020-03-28 – 2020-03-29 (×2): 2 [IU] via SUBCUTANEOUS
  Administered 2020-03-29 (×2): 1 [IU] via SUBCUTANEOUS
  Administered 2020-03-29: 3 [IU] via SUBCUTANEOUS
  Administered 2020-03-30: 1 [IU] via SUBCUTANEOUS
  Administered 2020-03-30: 2 [IU] via SUBCUTANEOUS

## 2020-03-25 MED ORDER — POTASSIUM CHLORIDE 10 MEQ/100ML IV SOLN
10.0000 meq | INTRAVENOUS | Status: AC
  Administered 2020-03-25 (×2): 10 meq via INTRAVENOUS
  Filled 2020-03-25 (×2): qty 100

## 2020-03-25 MED ORDER — ONDANSETRON HCL 4 MG/2ML IJ SOLN
INTRAMUSCULAR | Status: AC
  Filled 2020-03-25: qty 2

## 2020-03-25 MED ORDER — TAMSULOSIN HCL 0.4 MG PO CAPS
0.4000 mg | ORAL_CAPSULE | Freq: Every day | ORAL | Status: DC
  Administered 2020-03-25: 0.4 mg via ORAL
  Filled 2020-03-25: qty 1

## 2020-03-25 MED ORDER — ONDANSETRON HCL 4 MG/2ML IJ SOLN
4.0000 mg | Freq: Four times a day (QID) | INTRAMUSCULAR | Status: DC | PRN
  Administered 2020-03-25 – 2020-03-27 (×2): 4 mg via INTRAVENOUS
  Filled 2020-03-25: qty 2

## 2020-03-25 MED ORDER — FENTANYL CITRATE (PF) 100 MCG/2ML IJ SOLN
25.0000 ug | INTRAMUSCULAR | Status: DC | PRN
  Administered 2020-03-25 – 2020-03-27 (×3): 50 ug via INTRAVENOUS
  Filled 2020-03-25 (×3): qty 2

## 2020-03-25 MED ORDER — SODIUM CHLORIDE 0.9 % IV SOLN: INTRAVENOUS | Status: DC

## 2020-03-25 MED ORDER — GADOBUTROL 1 MMOL/ML IV SOLN
10.0000 mL | Freq: Once | INTRAVENOUS | Status: AC | PRN
  Administered 2020-03-25: 10 mL via INTRAVENOUS

## 2020-03-25 MED ORDER — ACETAMINOPHEN 650 MG RE SUPP: 650.0000 mg | Freq: Four times a day (QID) | RECTAL | Status: DC | PRN

## 2020-03-25 NOTE — Consult Note (Signed)
Subjective:   HPI  The patient is a 79 year old male who was admitted to the hospital with upper abdominal pain.  He was found to have elevated liver enzymes.  He had some nausea but no vomiting.  Denies fever.  He was found to have elevated liver enzymes.  Hepatitis panel was negative.  An abdominal ultrasound showed a thickened gallbladder wall.  Common bile duct was normal.  Lipase is elevated.  MRCP has been ordered.     Past Medical History:  Diagnosis Date  . Broken leg and ribs, right, closed, initial encounter   . COPD (chronic obstructive pulmonary disease) (HCC)   . Diabetes mellitus without complication (HCC)   . Diverticulitis   . GERD (gastroesophageal reflux disease)   . Hydronephrosis of left kidney   . Hyperlipidemia   . Hypertension   . Left ureteral stone    obstructing  . Renal calculus or stone 2015   Past Surgical History:  Procedure Laterality Date  . 2d echo  04/04/09  . CYSTOSCOPY WITH RETROGRADE PYELOGRAM, URETEROSCOPY AND STENT PLACEMENT Left 10/11/2014   Procedure: CYSTOSCOPY WITH RETROGRADE PYELOGRAM, DIAGNOSTIC URETEROSCOPY AND STENT PLACEMENT;  Surgeon: [name], MD;  Location: [facility];  Service: Urology;  Laterality: Left;  . LUMBAR DISC SURGERY    . SPINE SURGERY     Social History   Socioeconomic History  . Marital status: Married    Spouse name: Not on file  . Number of children: Not on file  . Years of education: Not on file  . Highest education level: Not on file  Occupational History  . Not on file  Tobacco Use  . Smoking status: Former Smoker    Types: Cigarettes    Quit date: 06/18/1967    Years since quitting: 52.8  . Smokeless tobacco: Former Neurosurgeon    Types: Chew  Substance and Sexual Activity  . Alcohol use: No  . Drug use: No  . Sexual activity: Not Currently  Other Topics Concern  . Not on file  Social History Narrative  . Not on file   Social Determinants of Health   Financial Resource Strain:    . Difficulty of Paying Living Expenses:   Food Insecurity:   . Worried About Programme researcher, broadcasting/film/video in the Last Year:   . Barista in the Last Year:   Transportation Needs:   . Freight forwarder (Medical):   Marland Kitchen Lack of Transportation (Non-Medical):   Physical Activity:   . Days of Exercise per Week:   . Minutes of Exercise per Session:   Stress:   . Feeling of Stress :   Social Connections:   . Frequency of Communication with Friends and Family:   . Frequency of Social Gatherings with Friends and Family:   . Attends Religious Services:   . Active Member of Clubs or Organizations:   . Attends Banker Meetings:   Marland Kitchen Marital Status:   Intimate Partner Violence:   . Fear of Current or Ex-Partner:   . Emotionally Abused:   Marland Kitchen Physically Abused:   . Sexually Abused:    family history includes COPD in his brother; Cancer in his sister; Early death in his father; Heart attack in his father.  Current Facility-Administered Medications:  .  0.9 %  sodium chloride infusion, , Intravenous, Continuous, Opyd, Lavone Neri, MD, Last Rate: 125 mL/hr at 03/25/20 0454, New Bag at 03/25/20 0454 .  acetaminophen (TYLENOL) tablet 650 mg, 650 mg,  Oral, Q6H PRN **OR** acetaminophen (TYLENOL) suppository 650 mg, 650 mg, Rectal, Q6H PRN, Opyd, Timothy S, MD .  albuterol (PROVENTIL) (2.5 MG/3ML) 0.083% nebulizer solution 2.5 mg, 2.5 mg, Nebulization, QID PRN, Opyd, Timothy S, MD .  fentaNYL (SUBLIMAZE) injection 25-50 mcg, 25-50 mcg, Intravenous, Q2H PRN, Opyd, Lavone Neri, MD, 50 mcg at 03/25/20 1038 .  insulin aspart (novoLOG) injection 0-9 Units, 0-9 Units, Subcutaneous, Q4H, Opyd, Lavone Neri, MD, 1 Units at 03/25/20 0534 .  lactated ringers infusion, , Intravenous, Continuous, Opyd, Lavone Neri, MD, Stopped at 03/25/20 0455 .  ondansetron (ZOFRAN) 4 MG/2ML injection, , , ,  .  ondansetron (ZOFRAN) injection 4 mg, 4 mg, Intravenous, Q6H PRN, Opyd, Lavone Neri, MD, 4 mg at 03/25/20 0115 .   ondansetron (ZOFRAN-ODT) disintegrating tablet 4 mg, 4 mg, Oral, Once, Opyd, Timothy S, MD .  piperacillin-tazobactam (ZOSYN) IVPB 3.375 g, 3.375 g, Intravenous, Q8H, Donterius, Filley, RPH, Stopped at 03/25/20 0830 .  sodium chloride flush (NS) 0.9 % injection 3 mL, 3 mL, Intravenous, Q12H, Opyd, Lavone Neri, MD  Current Outpatient Medications:  .  albuterol (PROAIR HFA) 108 (90 Base) MCG/ACT inhaler, USE 2 PUFFS EVERY 6 HOURS  AS NEEDED FOR WHEEZING (Patient taking differently: Inhale 1-2 puffs into the lungs every 6 (six) hours as needed for wheezing. ), Disp: 8.5 g, Rfl: 5 .  albuterol (PROVENTIL) (2.5 MG/3ML) 0.083% nebulizer solution, Take 3 mLs (2.5 mg total) by nebulization 4 (four) times daily as needed for wheezing or shortness of breath., Disp: 360 mL, Rfl: 1 .  aspirin 81 MG EC tablet, Take 81 mg by mouth every evening. , Disp: , Rfl:  .  atorvastatin (LIPITOR) 40 MG tablet, TAKE 1 TABLET BY MOUTH  DAILY (Patient taking differently: Take 40 mg by mouth every evening. ), Disp: 90 tablet, Rfl: 0 .  Cholecalciferol (VITAMIN D) 1000 UNITS capsule, Take 1,000 Units by mouth in the morning. , Disp: , Rfl:  .  Coenzyme Q10-Fish Oil-Vit E (CO-Q 10 OMEGA-3 FISH OIL) CAPS, Take 1 capsule by mouth in the morning. 100mg  Capsule Qunol, Disp: , Rfl:  .  fluticasone (FLONASE) 50 MCG/ACT nasal spray, USE 1 SPRAY INTO BOTH  NOSTRILS 2 TIMES DAILY AS  NEEDED FOR ALLERGIES OR  RHINITIS (Patient taking differently: Place 1 spray into both nostrils 2 (two) times daily as needed for allergies. FOR ALLERGIES OR  RHINITIS.), Disp: 48 g, Rfl: 0 .  lisinopril (ZESTRIL) 20 MG tablet, TAKE 1 TABLET BY MOUTH  DAILY (Patient taking differently: Take 20 mg by mouth every morning. ), Disp: 90 tablet, Rfl: 0 .  metFORMIN (GLUCOPHAGE-XR) 500 MG 24 hr tablet, TAKE 1 TABLET BY MOUTH TWO  TIMES DAILY (Patient taking differently: Take 500 mg by mouth in the morning and at bedtime. ), Disp: 180 tablet, Rfl: 0 .  metoprolol  succinate (TOPROL-XL) 50 MG 24 hr tablet, TAKE 1 TABLET BY MOUTH  DAILY WITH OR IMMEDIATELY  FOLLOWING A MEAL (Patient taking differently: Take 50 mg by mouth every morning. ), Disp: 90 tablet, Rfl: 0 .  omeprazole (PRILOSEC) 20 MG capsule, TAKE 1 CAPSULE BY MOUTH  DAILY (Patient taking differently: Take 20 mg by mouth daily. ), Disp: 90 capsule, Rfl: 0 .  tamsulosin (FLOMAX) 0.4 MG CAPS capsule, TAKE 2 CAPSULES BY MOUTH  DAILY AFTER SUPPER (Patient taking differently: Take 0.8 mg by mouth daily after supper. ), Disp: 180 capsule, Rfl: 0 Allergies  Allergen Reactions  . Diphenhydramine Hcl Other (See Comments)  Can tolerate     Objective:     BP 126/77 (BP Location: Left Arm)   Pulse 72   Temp 98.2 F (36.8 C) (Oral)   Resp 18   Ht 5\' 8"  (1.727 m)   Wt 111.1 kg   SpO2 95%   BMI 37.25 kg/m   No distress  Heart regular rhythm no murmurs  Lungs clear  Abdomen soft and there is some mild tenderness in the upper abdomen but no rebound  Laboratory No components found for: D1    Assessment:     Acute pancreatitis.  The patient tells me that he does not drink alcohol.  His triglycerides are normal.  This could be from biliary tract disease.  Surgery is on board.      Plan:     Follow clinically.  Supportive care with treatment for pancreatitis.  Check MRCP.  Will follow.

## 2020-03-25 NOTE — Consult Note (Signed)
Vincent Collins 10-26-1941  [id number].    Requesting MD: Dr. Odie Sera Chief Complaint/Reason for Consult: pancreatitis  HPI:  This is a 79 yo obese white male with a history of GERD, HTN, HLD, COPD, DM, who ate a chicken salad from [employer] last Monday along with his wife.  Both got sick afterwards with abdominal pain and some nausea.  This lasted for several days.  He developed some radiation to his chest and went to [facility] with [name] and was worked up with labs which were normal and a CTA of the C/A/P which was normal as well. He was discharged.  On Thursday his pain began to return again.  It was in the center of his abdomen and radiated through to his back.  He had minimal appetite.  Intermittent vague nausea, no vomiting.  No diarrhea.  He actually had constipation.  No fevers, or chills until Saturday when he developed the chills.  His wife has seemed to improve he states.  He went to [facility] where he was found to have elevated LFTS with a TB of 4.2 which is up to 5.1 today, but a normal WBC, although this is up to 20K today.  He underwent a hepatitis panel which is negative.  He had an [place] that revealed diffusely thickened gallbladder wall of 1cm but no Murphy's sign and no gallstones identified.  His CBD was normal at 74mm as well with no dilatation.  His lipase returned at 1400.  MRCP was ordered which has not been read yet.  We have been asked to evaluate the patient.  He denies use of ETOH.  ROS: ROS: Please see HPI, otherwise all other systems are currently negative.  Family History  Problem Relation Age of Onset  . Early death Father        MI age 9  . Heart attack Father   . COPD Brother   . Cancer Sister     Past Medical History:  Diagnosis Date  . Broken leg and ribs, right, closed, initial encounter   . COPD (chronic obstructive pulmonary disease) (HCC)   . Diabetes mellitus without complication (HCC)   . Diverticulitis   . GERD (gastroesophageal  reflux disease)   . Hydronephrosis of left kidney   . Hyperlipidemia   . Hypertension   . Left ureteral stone    obstructing  . Renal calculus or stone 2015    Past Surgical History:  Procedure Laterality Date  . 2d echo  04/04/09  . CYSTOSCOPY WITH RETROGRADE PYELOGRAM, URETEROSCOPY AND STENT PLACEMENT Left 10/11/2014   Procedure: CYSTOSCOPY WITH RETROGRADE PYELOGRAM, DIAGNOSTIC URETEROSCOPY AND STENT PLACEMENT;  Surgeon: [name], MD;  Location: [facility];  Service: Urology;  Laterality: Left;  . LUMBAR DISC SURGERY    . SPINE SURGERY      Social History:  reports that he quit smoking about 52 years ago. His smoking use included cigarettes. He has quit using smokeless tobacco.  His smokeless tobacco use included chew. He reports that he does not drink alcohol or use drugs.  Allergies:  Allergies  Allergen Reactions  . Diphenhydramine Hcl Other (See Comments)    Can tolerate    (Not in a hospital admission)    Physical Exam: Blood pressure 132/71, pulse 79, temperature 98.4 F (36.9 C), temperature source Oral, resp. rate 18, height 5\' 8"  (1.727 m), weight 111.1 kg, SpO2 90 %. General: pleasant, obese white male who is laying in bed in NAD  HEENT: head is normocephalic, atraumatic.  Sclera are noninjected.  PERRL.  Ears and nose without any masses or lesions.  Mouth is pink and moist Heart: regular, rate, and rhythm.  Normal s1,s2. No obvious murmurs, gallops, or rubs noted.  Palpable radial and pedal pulses bilaterally Lungs: CTAB, no wheezes, rhonchi, or rales noted.  Respiratory effort nonlabored Abd: soft, tender along right side of abdomen and in his epigastrium, ND, but rotund, +BS, no masses, or organomegaly, but small reducible umbilical hernia and rectus diastasis noted. MS: all 4 extremities are symmetrical with no cyanosis, clubbing, or edema. Skin: warm and dry with no masses, lesions, or rashes Neuro: Cranial nerves 2-12 grossly intact,  sensation is normal throughout Psych: A&Ox3 with an appropriate affect.   Results for orders placed or performed during the hospital encounter of 03/24/20 (from the past 48 hour(s))  Comprehensive metabolic panel     Status: Abnormal   Collection Time: 03/24/20  6:20 PM  Result Value Ref Range   Sodium 136 135 - 145 mmol/L   Potassium 3.6 3.5 - 5.1 mmol/L   Chloride 104 98 - 111 mmol/L   CO2 20 (L) 22 - 32 mmol/L   Glucose, Bld 226 (H) 70 - 99 mg/dL    Comment: Glucose reference range applies only to samples taken after fasting for at least 8 hours.   BUN 21 8 - 23 mg/dL   Creatinine, Ser 1.61 0.61 - 1.24 mg/dL   Calcium 8.3 (L) 8.9 - 10.3 mg/dL   Total Protein 6.6 6.5 - 8.1 g/dL   Albumin 3.5 3.5 - 5.0 g/dL   AST 096 (H) 15 - 41 U/L   ALT 255 (H) 0 - 44 U/L   Alkaline Phosphatase 195 (H) 38 - 126 U/L   Total Bilirubin 4.2 (H) 0.3 - 1.2 mg/dL   GFR calc non Af Amer >60 >60 mL/min   GFR calc Af Amer >60 >60 mL/min   Anion gap 12 5 - 15    Comment: Performed at [facility], [address] [place], [place] [address]  Lactic acid, plasma     Status: Abnormal   Collection Time: 03/24/20  6:20 PM  Result Value Ref Range   Lactic Acid, Venous 3.3 (HH) 0.5 - 1.9 mmol/L    Comment: CRITICAL RESULT CALLED TO, READ BACK BY AND VERIFIED WITH: [name],A. AT 1915 ON 03/24/2020 BY [name] Performed at [facility], [address], [place], [place] [address]   CBC with Differential     Status: Abnormal   Collection Time: 03/24/20  6:20 PM  Result Value Ref Range   WBC 7.7 4.0 - 10.5 K/uL   RBC 5.30 4.22 - 5.81 MIL/uL   Hemoglobin 16.1 13.0 - 17.0 g/dL   HCT 14.7 82.9 - 56.2 %   MCV 90.0 80.0 - 100.0 fL   MCH 30.4 26.0 - 34.0 pg   MCHC 33.8 30.0 - 36.0 g/dL   RDW 13.0 86.5 - 78.4 %   Platelets 178 150 - 400 K/uL   nRBC 0.0 0.0 - 0.2 %   Neutrophils Relative % 93 %   Neutro Abs 7.1 1.7 - 7.7 K/uL   Lymphocytes Relative 7 %   Lymphs Abs 0.5 (L) 0.7 - 4.0 K/uL   Monocytes Relative 0 %    Monocytes Absolute 0.0 (L) 0.1 - 1.0 K/uL   Eosinophils Relative 0 %   Eosinophils Absolute 0.0 0.0 - 0.5 K/uL   Basophils Relative 0 %   Basophils Absolute 0.0  0.0 - 0.1 K/uL   Immature Granulocytes 0 %   Abs Immature Granulocytes 0.03 0.00 - 0.07 K/uL    Comment: Performed at [facility], [address] [place], [place] [address]  Protime-INR     Status: None   Collection Time: 03/24/20  6:20 PM  Result Value Ref Range   Prothrombin Time 13.2 11.4 - 15.2 seconds   INR 1.0 0.8 - 1.2    Comment: (NOTE) INR goal varies based on device and disease states. Performed at [facility], [address] [place], [place] [address]   Culture, blood (Routine x 2)     Status: None (Preliminary result)   Collection Time: 03/24/20  6:20 PM   Specimen: Left Antecubital; Blood  Result Value Ref Range   Specimen Description LEFT ANTECUBITAL    Special Requests      BOTTLES DRAWN AEROBIC AND ANAEROBIC Blood Culture adequate volume Performed at [facility], [address], [place], [place] [address]    Culture PENDING    Report Status PENDING   Culture, blood (Routine x 2)     Status: None (Preliminary result)   Collection Time: 03/24/20  7:01 PM   Specimen: Left Antecubital; Blood  Result Value Ref Range   Specimen Description LEFT ANTECUBITAL    Special Requests      BOTTLES DRAWN AEROBIC AND ANAEROBIC Blood Culture adequate volume Performed at [facility], [address], [place], [place] [address]    Culture PENDING    Report Status PENDING   POC SARS Coronavirus 2 Ag-ED -     Status: None   Collection Time: 03/24/20  7:04 PM  Result Value Ref Range   SARS Coronavirus 2 Ag NEGATIVE NEGATIVE    Comment: (NOTE) SARS-CoV-2 antigen NOT DETECTED.  Negative results are presumptive.  Negative results do not preclude SARS-CoV-2 infection and should not be used as the sole basis for treatment or other patient management decisions, including infection  control decisions, particularly in the  presence of clinical signs and  symptoms consistent with COVID-19, or in those who have been in contact with the virus.  Negative results must be combined with clinical observations, patient history, and epidemiological information. The expected result is Negative. Fact Sheet for Patients: https://sanders-williams.net/ Fact Sheet for Healthcare Providers: https://martinez.com/ This test is not yet approved or cleared by the [place] FDA and  has been authorized for detection and/or diagnosis of SARS-CoV-2 by FDA under an Emergency Use Authorization (EUA).  This EUA will remain in effect (meaning this test can be used) for the duration of  the COVID-19 de claration under Section 564(b)(1) of the Act, 21 U.S.C. section 360bbb-3(b)(1), unless the authorization is terminated or revoked sooner.   Respiratory Panel by RT PCR (Flu A&B, Covid) - Nasopharyngeal Swab     Status: None   Collection Time: 03/24/20  7:10 PM   Specimen: Nasopharyngeal Swab  Result Value Ref Range   SARS Coronavirus 2 by RT PCR NEGATIVE NEGATIVE    Comment: (NOTE) SARS-CoV-2 target nucleic acids are NOT DETECTED. The SARS-CoV-2 RNA is generally detectable in upper respiratoy specimens during the acute phase of infection. The lowest concentration of SARS-CoV-2 viral copies this assay can detect is 131 copies/mL. A negative result does not preclude SARS-Cov-2 infection and should not be used as the sole basis for treatment or other patient management decisions. A negative result may occur with  improper specimen collection/handling, submission of specimen other than nasopharyngeal swab, presence of viral mutation(s) within the  areas targeted by this assay, and inadequate number of viral copies (<131 copies/mL). A negative result must be combined with clinical observations, patient history, and epidemiological information. The expected result is Negative. Fact Sheet for Patients:   https://www.moore.com/ Fact Sheet for Healthcare Providers:  https://www.young.biz/ This test is not yet ap proved or cleared by the [place] FDA and  has been authorized for detection and/or diagnosis of SARS-CoV-2 by FDA under an Emergency Use Authorization (EUA). This EUA will remain  in effect (meaning this test can be used) for the duration of the COVID-19 declaration under Section 564(b)(1) of the Act, 21 U.S.C. section 360bbb-3(b)(1), unless the authorization is terminated or revoked sooner.    Influenza A by PCR NEGATIVE NEGATIVE   Influenza B by PCR NEGATIVE NEGATIVE    Comment: (NOTE) The Xpert Xpress SARS-CoV-2/FLU/RSV assay is intended as an aid in  the diagnosis of influenza from Nasopharyngeal swab specimens and  should not be used as a sole basis for treatment. Nasal washings and  aspirates are unacceptable for Xpert Xpress SARS-CoV-2/FLU/RSV  testing. Fact Sheet for Patients: https://www.moore.com/ Fact Sheet for Healthcare Providers: https://www.young.biz/ This test is not yet approved or cleared by the [place] FDA and  has been authorized for detection and/or diagnosis of SARS-CoV-2 by  FDA under an Emergency Use Authorization (EUA). This EUA will remain  in effect (meaning this test can be used) for the duration of the  Covid-19 declaration under Section 564(b)(1) of the Act, 21  U.S.C. section 360bbb-3(b)(1), unless the authorization is  terminated or revoked. Performed at [facility], [address] [place], [place] [address]   Lactic acid, plasma     Status: Abnormal   Collection Time: 03/24/20  8:36 PM  Result Value Ref Range   Lactic Acid, Venous 2.7 (HH) 0.5 - 1.9 mmol/L    Comment: CRITICAL RESULT CALLED TO, READ BACK BY AND VERIFIED WITH: [name],A @ 2108 ON 03/24/20 BY JUW Performed at [facility], [address], [place], [place] [address]   Hepatitis panel,  acute     Status: None   Collection Time: 03/25/20  1:40 AM  Result Value Ref Range   Hepatitis B Surface Ag NON REACTIVE NON REACTIVE   HCV Ab NON REACTIVE NON REACTIVE    Comment: (NOTE) Nonreactive HCV antibody screen is consistent with no HCV infections,  unless recent infection is suspected or other evidence exists to indicate HCV infection.    Hep A IgM NON REACTIVE NON REACTIVE   Hep B C IgM NON REACTIVE NON REACTIVE    Comment: Performed at [facility], [address] [place], [place] [address]  Lipase, blood     Status: Abnormal   Collection Time: 03/25/20  1:40 AM  Result Value Ref Range   Lipase 1,476 (H) 11 - 51 U/L    Comment: RESULTS CONFIRMED BY MANUAL DILUTION Performed at [facility], [address], [place], [place] [address]   Lactic acid, plasma     Status: Abnormal   Collection Time: 03/25/20  1:40 AM  Result Value Ref Range   Lactic Acid, Venous 2.1 (HH) 0.5 - 1.9 mmol/L    Comment: CRITICAL RESULT CALLED TO, READ BACK BY AND VERIFIED WITH: MCGURY K,RN 03/25/20 0237 WAYK Performed at [facility], [address], [place], [place] [address]   Hemoglobin A1c     Status: Abnormal   Collection Time: 03/25/20  1:40 AM  Result Value Ref Range  Hgb A1c MFr Bld 6.9 (H) 4.8 - 5.6 %    Comment: (NOTE) Pre diabetes:          5.7%-6.4% Diabetes:              >6.4% Glycemic control for   <7.0% adults with diabetes    Mean Plasma Glucose 151.33 mg/dL    Comment: Performed at [facility], [address] [place], [place] [address]  CBG monitoring, ED     Status: Abnormal   Collection Time: 03/25/20  2:22 AM  Result Value Ref Range   Glucose-Capillary 185 (H) 70 - 99 mg/dL    Comment: Glucose reference range applies only to samples taken after fasting for at least 8 hours.  Lactic acid, plasma     Status: Abnormal   Collection Time: 03/25/20  3:59 AM  Result Value Ref Range   Lactic Acid, Venous 3.0 (HH) 0.5 - 1.9 mmol/L    Comment:  CRITICAL VALUE NOTED.  VALUE IS CONSISTENT WITH PREVIOUSLY REPORTED AND CALLED VALUE. Performed at [facility], [address] [place], [place] [address]   Comprehensive metabolic panel     Status: Abnormal   Collection Time: 03/25/20  4:20 AM  Result Value Ref Range   Sodium 138 135 - 145 mmol/L   Potassium 3.3 (L) 3.5 - 5.1 mmol/L   Chloride 104 98 - 111 mmol/L   CO2 22 22 - 32 mmol/L   Glucose, Bld 178 (H) 70 - 99 mg/dL    Comment: Glucose reference range applies only to samples taken after fasting for at least 8 hours.   BUN 19 8 - 23 mg/dL   Creatinine, Ser 8.291.49 (H) 0.61 - 1.24 mg/dL   Calcium 8.1 (L) 8.9 - 10.3 mg/dL   Total Protein 5.3 (L) 6.5 - 8.1 g/dL   Albumin 2.7 (L) 3.5 - 5.0 g/dL   AST 562266 (H) 15 - 41 U/L   ALT 258 (H) 0 - 44 U/L   Alkaline Phosphatase 145 (H) 38 - 126 U/L   Total Bilirubin 5.1 (H) 0.3 - 1.2 mg/dL   GFR calc non Af Amer 45 (L) >60 mL/min   GFR calc Af Amer 52 (L) >60 mL/min   Anion gap 12 5 - 15    Comment: Performed at [facility], [address] [place], [place] [address]  Magnesium     Status: Abnormal   Collection Time: 03/25/20  4:20 AM  Result Value Ref Range   Magnesium 1.2 (L) 1.7 - 2.4 mg/dL    Comment: Performed at [facility], [address] [place], [place] [address]  CBC WITH DIFFERENTIAL     Status: Abnormal   Collection Time: 03/25/20  4:20 AM  Result Value Ref Range   WBC 20.4 (H) 4.0 - 10.5 K/uL   RBC 5.02 4.22 - 5.81 MIL/uL   Hemoglobin 15.0 13.0 - 17.0 g/dL   HCT 96.244.7 95.239.0 - 84.152.0 %   MCV 89.0 80.0 - 100.0 fL   MCH 29.9 26.0 - 34.0 pg   MCHC 33.6 30.0 - 36.0 g/dL   RDW 32.413.2 40.111.5 - 02.715.5 %   Platelets 161 150 - 400 K/uL   nRBC 0.0 0.0 - 0.2 %   Neutrophils Relative % 92 %   Neutro Abs 18.7 (H) 1.7 - 7.7 K/uL   Lymphocytes Relative 2 %   Lymphs Abs 0.4 (L) 0.7 - 4.0 K/uL   Monocytes Relative 5 %  Monocytes Absolute 1.0 0.1 - 1.0 K/uL   Eosinophils Relative 0 %   Eosinophils Absolute 0.0 0.0 - 0.5  K/uL   Basophils Relative 0 %   Basophils Absolute 0.1 0.0 - 0.1 K/uL   WBC Morphology INCREASED BANDS (>20% BANDS)     Comment: MILD LEFT SHIFT (1-5% METAS, OCC MYELO, OCC BANDS) VACUOLATED NEUTROPHILS    Immature Granulocytes 1 %   Abs Immature Granulocytes 0.21 (H) 0.00 - 0.07 K/uL    Comment: Performed at [facility], [address], [place], [place] [address]  Protime-INR     Status: None   Collection Time: 03/25/20  4:20 AM  Result Value Ref Range   Prothrombin Time 14.3 11.4 - 15.2 seconds   INR 1.1 0.8 - 1.2    Comment: (NOTE) INR goal varies based on device and disease states. Performed at [facility], [address] [place], [place] [address]   Urinalysis, Routine w reflex microscopic     Status: Abnormal   Collection Time: 03/25/20  4:50 AM  Result Value Ref Range   Color, Urine AMBER (A) YELLOW    Comment: BIOCHEMICALS MAY BE AFFECTED BY COLOR   APPearance CLEAR CLEAR   Specific Gravity, Urine 1.023 1.005 - 1.030   pH 5.0 5.0 - 8.0   Glucose, UA NEGATIVE NEGATIVE mg/dL   Hgb urine dipstick NEGATIVE NEGATIVE   Bilirubin Urine NEGATIVE NEGATIVE   Ketones, ur NEGATIVE NEGATIVE mg/dL   Protein, ur 30 (A) NEGATIVE mg/dL   Nitrite NEGATIVE NEGATIVE   Leukocytes,Ua NEGATIVE NEGATIVE   RBC / HPF 0-5 0 - 5 RBC/hpf   WBC, UA 0-5 0 - 5 WBC/hpf   Bacteria, UA NONE SEEN NONE SEEN   Squamous Epithelial / LPF 0-5 0 - 5    Comment: Performed at [facility], [address] [place], [place] [address]  CBG monitoring, ED     Status: Abnormal   Collection Time: 03/25/20  5:31 AM  Result Value Ref Range   Glucose-Capillary 148 (H) 70 - 99 mg/dL    Comment: Glucose reference range applies only to samples taken after fasting for at least 8 hours.   DG Chest Port 1 View  Result Date: 03/24/2020 CLINICAL DATA:  Sepsis. EXAM: PORTABLE CHEST 1 VIEW COMPARISON:  January 04, 2020 FINDINGS: Heart size is enlarged. There is no pneumothorax or pleural effusion. There  are airspace opacities at the lung bases bilaterally favored to represent areas of scarring atelectasis. There is no acute osseous abnormality. IMPRESSION: 1. Cardiomegaly. 2. Bibasilar airspace opacities favored to represent scarring atelectasis. Electronically Signed   By: Katherine Mantle M.D.   On: 03/24/2020 18:50   US Abdomen Limited RUQ  Result Date: 03/25/2020 CLINICAL DATA:  Right upper quadrant pain EXAM: ULTRASOUND ABDOMEN LIMITED RIGHT UPPER QUADRANT COMPARISON:  None. FINDINGS: Gallbladder: Diffusely thickened gallbladder wall measuring up to 1 cm. No sonographic Murphy sign. Small amount of pericholecystic fluid is seen. Common bile duct: Diameter: 4.4 mm Liver: No focal lesion identified. Within normal limits in parenchymal echogenicity. Portal vein is patent on color Doppler imaging with normal direction of blood flow towards the liver. Other: None. IMPRESSION: Diffusely thickened gallbladder wall with a small amount of pericholecystic fluid. This is nonspecific, could be due to acalculous cholecystitis Electronically Signed   By: Jonna Clark M.D.   On: 03/25/2020 00:13      Assessment/Plan HTN COPD HLD GERD DM  Pancreatitis with questionable acalculous cholecystitis The patient  clearly has pancreatitis.  The most common reason for this is usually ETOH or gallstones.  He does not drink ETOH and does not have evidence of gallstones on his imaging.  His CBD is normal with no dilatation that suggests the passage of a stone, but this can not be definitively ruled out.  It is possible that he has reactive wall thickening and fluid around his gallbladder from his pancreatitis and does not actual have cholecystitis.  It is also possible the edema from the head of the pancreas may be causing his LFT elevation.  MRCP is still pending and will await these results.  Check triglyceride level to rule out pancreatitis from this.  His WBC may likely be elevated today from inflammation from his  pancreatitis, but can't rule out from gallbladder either at this junction.  Agree with zosyn since cholecystitis can't be ruled out right now.  No needs for surgery at this time.  If we proceed with surgery and determine he needs a cholecystectomy, this will have to be after his pancreatitis resolves.  Agree with aggressive IV fluid hydration.  Will follow.   FEN - NPO/IVFs VTE - ok for chemical prophylaxis from our standpoint ID - zosyn   Henreitta Cea, [facility] 03/25/2020, 8:11 AM Please see Amion for pager number during day hours 7:00am-4:30pm or 7:00am -11:30am on weekends

## 2020-03-25 NOTE — Progress Notes (Signed)
-  Patient was admitted earlier today. -As per H&P done by Dr. [name] is a 79 y.o. male with medical history significant for COPD and type 2 diabetes mellitus, now presenting with abdominal pain, nausea, and vomiting.  Patient reports that he was in his usual state of health until 03/19/2020 when shortly after eating a salad with chicken on it, he developed severe nausea without vomiting and pain in the lower abdomen and up in his chest.  He was evaluated for this at an outside hospital was CTA chest/abdomen/pelvis that was reported to be negative for any acute findings.  Symptoms improved quite a bit but never completely resolved.  Since then, he has had a loss of appetite, ongoing nausea, ongoing pain, and then developed chills yesterday with worsening in his pain.  Pain is no longer in his chest but mainly in the mid and lower abdomen.  He ate some oatmeal today without change in symptoms. Denies any recent shortness of breath or cough.  Denies diarrhea.  He has never experienced this previously  ED Course: Upon arrival to the ED, patient is found to be febrile to 38.1 C, saturating low 90s on 2 L/min of supplemental oxygen, tachycardic in the 120s, and with stable blood pressure.  EKG with sinus tachycardia, rate 124.  Chest x-ray with cardiomegaly and bibasilar opacities likely reflecting scarring.  Chemistry panel with glucose 226, AST 352, ALT 255, and total bilirubin 4.2.  CBC unremarkable.  Lactic acid 3.3.  Influenza and Covid PCR is negative.  Ultrasound of the right upper quadrant demonstrates diffusely thickened gallbladder wall with a small amount of pericholecystic fluid.  Blood cultures were collected in the ED, 2 L of IV fluids were administered, patient was treated with Zosyn, morphine, and Zofran, gastroenterology and surgery were consulted by the ED physician, and MRCP and viral hepatitis panel were ordered".  03/25/2020: Work-up has revealed acute pancreatitis.  Gallbladder thickening  is also noted.  Elevated bilirubin (T bili of 5.1).  Lipase of 1476.  Potassium of 3.3, magnesium of 1.2, serum creatinine of 1.49, AST of 266, ALT of 258, lactic acid has trended downwards from 3 to 1.6, significant leukocytosis of 20.4.  Preliminary blood cultures growing E. Coli.  Continue supportive care for acute pancreatitis. Follow final blood culture Continue antibiotics Cannot rule out acute cholangitis/acute cholecystitis GI and surgical input appreciated Monitor and correct abnormal electrolytes Replete magnesium A.m. labs Further management depend on hospital course.

## 2020-03-25 NOTE — ED Notes (Signed)
Assumed care of pt. Pt alert, resting on cart in NAD. VSS. Breathing easy, non-labored. Equal rise and fall of chest noted. Will continue to monitor. Call light within reach

## 2020-03-25 NOTE — ED Notes (Signed)
Pt taken to MRI  

## 2020-03-25 NOTE — ED Notes (Signed)
Fluid bolus initiated per MAR. 1 unit of insulin given per [facility] for CBG of 148.  Name/DOB verified with pt

## 2020-03-25 NOTE — ED Notes (Signed)
Care endorsed to Kelly, RN 

## 2020-03-25 NOTE — ED Notes (Signed)
Gram negative rods in anaerobic and aerobic bottles.

## 2020-03-25 NOTE — Plan of Care (Signed)

## 2020-03-25 NOTE — ED Notes (Signed)
meds given per [facility]. Name/DOB verified with pt. Pt provided with urinal. Able to void 300 cc of dark urine. Urine collected, labeled with 2 pt identifiers, and sent to lab

## 2020-03-25 NOTE — ED Notes (Signed)
Pt transported to US

## 2020-03-25 NOTE — ED Notes (Signed)
C/o abd being very sore when he tried to sit up on the side of the bed to urinate

## 2020-03-25 NOTE — ED Notes (Signed)
Dr. Antionette Char notified of lactate of 3

## 2020-03-25 NOTE — Progress Notes (Signed)
PHARMACY - PHYSICIAN COMMUNICATION CRITICAL VALUE ALERT - BLOOD CULTURE IDENTIFICATION (BCID)  KHALIQ TURAY is an 79 y.o. male who presented to [facility] on 03/24/2020 with a chief complaint of abdominal pain.   Assessment:  79 yo male admitted with upper abdominal pain and admitted with cholangitis. Called by micro lab reporting 4 of 4 blood culture bottles growing e.coli (no resistance mechanism found).   Name of physician (or Provider) ContactedLeonia Corona, MD  Current antibiotics: Piperacillin-tazobactam  Changes to prescribed antibiotics recommended:  Change to Ceftriaxone 2gm IV q24h with metronidazole 500mg  IV q8h   Moorea Boissonneault A. , PharmD, BCPS, FNKF Clinical Pharmacist [place] Please utilize Amion for appropriate phone number to reach the unit pharmacist [facility])   03/25/2020  1:57 PM

## 2020-03-25 NOTE — ED Notes (Signed)
Verlon Pischke, wife, [phone] Would like an update when available

## 2020-03-25 NOTE — ED Notes (Signed)
Pt back from MRI without incidence 

## 2020-03-25 NOTE — H&P (Addendum)
History and Physical    Vincent Collins MEQ:[id number] DOB: 07-May-1941 DOA: 03/24/2020  PCP: Dettinger, Elige Radon, MD   Patient coming from: Home   Chief Complaint: Abdominal pain, N/V   HPI: Vincent Collins is a 79 y.o. male with medical history significant for COPD and type 2 diabetes mellitus, now presenting with abdominal pain, nausea, and vomiting.  Patient reports that he was in his usual state of health until 03/19/2020 when shortly after eating a salad with chicken on it, he developed severe nausea without vomiting and pain in the lower abdomen and up in his chest.  He was evaluated for this at an outside hospital was CTA chest/abdomen/pelvis that was reported to be negative for any acute findings.  Symptoms improved quite a bit but never completely resolved.  Since then, he has had a loss of appetite, ongoing nausea, ongoing pain, and then developed chills yesterday with worsening in his pain.  Pain is no longer in his chest but mainly in the mid and lower abdomen.  He ate some oatmeal today without change in symptoms. Denies any recent shortness of breath or cough.  Denies diarrhea.  He has never experienced this previously  ED Course: Upon arrival to the ED, patient is found to be febrile to 38.1 C, saturating low 90s on 2 L/min of supplemental oxygen, tachycardic in the 120s, and with stable blood pressure.  EKG with sinus tachycardia, rate 124.  Chest x-ray with cardiomegaly and bibasilar opacities likely reflecting scarring.  Chemistry panel with glucose 226, AST 352, ALT 255, and total bilirubin 4.2.  CBC unremarkable.  Lactic acid 3.3.  Influenza and Covid PCR is negative.  Ultrasound of the right upper quadrant demonstrates diffusely thickened gallbladder wall with a small amount of pericholecystic fluid.  Blood cultures were collected in the ED, 2 L of IV fluids were administered, patient was treated with Zosyn, morphine, and Zofran, gastroenterology and surgery were consulted by the ED  physician, and MRCP and viral hepatitis panel were ordered.  Review of Systems:  All other systems reviewed and apart from HPI, are negative.  Past Medical History:  Diagnosis Date  . Broken leg and ribs, right, closed, initial encounter   . COPD (chronic obstructive pulmonary disease) (HCC)   . Diabetes mellitus without complication (HCC)   . Diverticulitis   . GERD (gastroesophageal reflux disease)   . Hydronephrosis of left kidney   . Hyperlipidemia   . Hypertension   . Left ureteral stone    obstructing  . Renal calculus or stone 2015    Past Surgical History:  Procedure Laterality Date  . 2d echo  04/04/09  . CYSTOSCOPY WITH RETROGRADE PYELOGRAM, URETEROSCOPY AND STENT PLACEMENT Left 10/11/2014   Procedure: CYSTOSCOPY WITH RETROGRADE PYELOGRAM, DIAGNOSTIC URETEROSCOPY AND STENT PLACEMENT;  Surgeon: [name], MD;  Location: [facility];  Service: Urology;  Laterality: Left;  . LUMBAR DISC SURGERY    . SPINE SURGERY       reports that he quit smoking about 52 years ago. His smoking use included cigarettes. He has quit using smokeless tobacco.  His smokeless tobacco use included chew. He reports that he does not drink alcohol or use drugs.  Allergies  Allergen Reactions  . Diphenhydramine Hcl Other (See Comments)    Can tolerate    Family History  Problem Relation Age of Onset  . Early death Father        MI age 75  . Heart attack Father   .  COPD Brother   . Cancer Sister      Prior to Admission medications   Medication Sig Start Date End Date Taking? Authorizing Provider  albuterol (PROAIR HFA) 108 (90 Base) MCG/ACT inhaler USE 2 PUFFS EVERY 6 HOURS  AS NEEDED FOR WHEEZING Patient taking differently: Inhale 1-2 puffs into the lungs every 6 (six) hours as needed for wheezing.  05/25/18  Yes Dettinger, Elige Radon, MD  albuterol (PROVENTIL) (2.5 MG/3ML) 0.083% nebulizer solution Take 3 mLs (2.5 mg total) by nebulization 4 (four) times daily as  needed for wheezing or shortness of breath. 10/26/19 03/24/20 Yes Dettinger, Elige Radon, MD  aspirin 81 MG EC tablet Take 81 mg by mouth every evening.    Yes [provider]  atorvastatin (LIPITOR) 40 MG tablet TAKE 1 TABLET BY MOUTH  DAILY Patient taking differently: Take 40 mg by mouth every evening.  03/05/20  Yes Dettinger, Elige Radon, MD  Cholecalciferol (VITAMIN D) 1000 UNITS capsule Take 1,000 Units by mouth in the morning.    Yes [provider]  Coenzyme Q10-Fish Oil-Vit E (CO-Q 10 OMEGA-3 FISH OIL) CAPS Take 1 capsule by mouth in the morning. 100mg  Capsule Qunol   Yes [provider]  fluticasone (FLONASE) 50 MCG/ACT nasal spray USE 1 SPRAY INTO BOTH  NOSTRILS 2 TIMES DAILY AS  NEEDED FOR ALLERGIES OR  RHINITIS Patient taking differently: Place 1 spray into both nostrils 2 (two) times daily as needed for allergies. FOR ALLERGIES OR  RHINITIS. 03/05/20  Yes Dettinger, 05/05/20, MD  lisinopril (ZESTRIL) 20 MG tablet TAKE 1 TABLET BY MOUTH  DAILY Patient taking differently: Take 20 mg by mouth every morning.  03/05/20  Yes Dettinger, 05/05/20, MD  metFORMIN (GLUCOPHAGE-XR) 500 MG 24 hr tablet TAKE 1 TABLET BY MOUTH TWO  TIMES DAILY Patient taking differently: Take 500 mg by mouth in the morning and at bedtime.  03/05/20  Yes Dettinger, 05/05/20, MD  metoprolol succinate (TOPROL-XL) 50 MG 24 hr tablet TAKE 1 TABLET BY MOUTH  DAILY WITH OR IMMEDIATELY  FOLLOWING A MEAL Patient taking differently: Take 50 mg by mouth every morning.  03/05/20  Yes Dettinger, 05/05/20, MD  omeprazole (PRILOSEC) 20 MG capsule TAKE 1 CAPSULE BY MOUTH  DAILY Patient taking differently: Take 20 mg by mouth daily.  03/05/20  Yes Dettinger, 05/05/20, MD  tamsulosin (FLOMAX) 0.4 MG CAPS capsule TAKE 2 CAPSULES BY MOUTH  DAILY AFTER SUPPER Patient taking differently: Take 0.8 mg by mouth daily after supper.  03/05/20  Yes Dettinger, 05/05/20, MD    Physical Exam: Vitals:   03/24/20 2045 03/24/20 2100  03/24/20 2130 03/24/20 2200  BP:  130/79 114/66 121/62  Pulse: 98 (!) 103 83 92  Resp: (!) 30 (!) 24 (!) 22   Temp:      TempSrc:      SpO2: 94% 92% 92% 93%  Weight:      Height:        Constitutional: NAD, calm  Eyes: PERTLA, lids and conjunctivae normal ENMT: Mucous membranes are moist. Posterior pharynx clear of any exudate or lesions.   Neck: normal, supple, no masses, no thyromegaly Respiratory:  no wheezing, no crackles. No accessory muscle use.  Cardiovascular: S1 & S2 heard, regular rate and rhythm. No extremity edema.   Abdomen: Soft, tender in mid- and upper abdomen, no rebound pain or guarding. Bowel sounds active.  Musculoskeletal: no clubbing / cyanosis. No joint deformity upper and lower extremities.   Skin:  no significant rashes, lesions, ulcers. Warm, dry, well-perfused. Neurologic: No gross facial asymmetry. Sensation intact. Moving all extremities.  Psychiatric: Alert and oriented to person, place, and situation. Very pleasant and cooperative.    Labs and Imaging on Admission: I have personally reviewed following labs and imaging studies  CBC: Recent Labs  Lab 03/24/20 1820  WBC 7.7  NEUTROABS 7.1  HGB 16.1  HCT 47.7  MCV 90.0  PLT 178   Basic Metabolic Panel: Recent Labs  Lab 03/24/20 1820  NA 136  K 3.6  CL 104  CO2 20*  GLUCOSE 226*  BUN 21  CREATININE 1.07  CALCIUM 8.3*   GFR: Estimated Creatinine Clearance: 69.9 mL/min (by C-G formula based on SCr of 1.07 mg/dL). Liver Function Tests: Recent Labs  Lab 03/24/20 1820  AST 352*  ALT 255*  ALKPHOS 195*  BILITOT 4.2*  PROT 6.6  ALBUMIN 3.5   No results for input(s): LIPASE, AMYLASE in the last 168 hours. No results for input(s): AMMONIA in the last 168 hours. Coagulation Profile: Recent Labs  Lab 03/24/20 1820  INR 1.0   Cardiac Enzymes: No results for input(s): CKTOTAL, CKMB, CKMBINDEX, TROPONINI in the last 168 hours. BNP (last 3 results) No results for input(s): PROBNP in  the last 8760 hours. HbA1C: No results for input(s): HGBA1C in the last 72 hours. CBG: No results for input(s): GLUCAP in the last 168 hours. Lipid Profile: No results for input(s): CHOL, HDL, LDLCALC, TRIG, CHOLHDL, LDLDIRECT in the last 72 hours. Thyroid Function Tests: No results for input(s): TSH, T4TOTAL, FREET4, T3FREE, THYROIDAB in the last 72 hours. Anemia Panel: No results for input(s): VITAMINB12, FOLATE, FERRITIN, TIBC, IRON, RETICCTPCT in the last 72 hours. Urine analysis:    Component Value Date/Time   COLORURINE YELLOW 09/20/2014 2300   APPEARANCEUR CLEAR 09/20/2014 2300   LABSPEC 1.010 09/20/2014 2300   PHURINE 6.0 09/20/2014 2300   GLUCOSEU NEGATIVE 09/20/2014 2300   HGBUR TRACE (A) 09/20/2014 2300   BILIRUBINUR small 08/07/2015 1659   KETONESUR NEGATIVE 09/20/2014 2300   PROTEINUR trace 08/07/2015 1659   PROTEINUR NEGATIVE 09/20/2014 2300   UROBILINOGEN negative 08/07/2015 1659   UROBILINOGEN 0.2 09/20/2014 2300   NITRITE neg 08/07/2015 1659   NITRITE NEGATIVE 09/20/2014 2300   LEUKOCYTESUR Negative 08/07/2015 1659   Sepsis Labs: @LABRCNTIP (procalcitonin:4,lacticidven:4) ) Recent Results (from the past 240 hour(s))  Culture, blood (Routine x 2)     Status: None (Preliminary result)   Collection Time: 03/24/20  6:20 PM   Specimen: Left Antecubital; Blood  Result Value Ref Range Status   Specimen Description LEFT ANTECUBITAL  Final   Special Requests   Final    BOTTLES DRAWN AEROBIC AND ANAEROBIC Blood Culture adequate volume Performed at [facility], [address] [place], [place]    Culture PENDING  Incomplete   Report Status PENDING  Incomplete  Culture, blood (Routine x 2)     Status: None (Preliminary result)   Collection Time: 03/24/20  7:01 PM   Specimen: Left Antecubital; Blood  Result Value Ref Range Status   Specimen Description LEFT ANTECUBITAL  Final   Special Requests   Final    BOTTLES DRAWN AEROBIC AND ANAEROBIC Blood  Culture adequate volume Performed at [facility], [address], [place], [place]    Culture PENDING  Incomplete   Report Status PENDING  Incomplete  Respiratory Panel by RT PCR (Flu A&B, Covid) - Nasopharyngeal Swab     Status: None   Collection Time:  03/24/20  7:10 PM   Specimen: Nasopharyngeal Swab  Result Value Ref Range Status   SARS Coronavirus 2 by RT PCR NEGATIVE NEGATIVE Final    Comment: (NOTE) SARS-CoV-2 target nucleic acids are NOT DETECTED. The SARS-CoV-2 RNA is generally detectable in upper respiratoy specimens during the acute phase of infection. The lowest concentration of SARS-CoV-2 viral copies this assay can detect is 131 copies/mL. A negative result does not preclude SARS-Cov-2 infection and should not be used as the sole basis for treatment or other patient management decisions. A negative result may occur with  improper specimen collection/handling, submission of specimen other than nasopharyngeal swab, presence of viral mutation(s) within the areas targeted by this assay, and inadequate number of viral copies (<131 copies/mL). A negative result must be combined with clinical observations, patient history, and epidemiological information. The expected result is Negative. Fact Sheet for Patients:  https://www.moore.com/https://www.fda.gov/media/142436/download Fact Sheet for Healthcare Providers:  https://www.young.biz/https://www.fda.gov/media/142435/download This test is not yet ap proved or cleared by the [place] FDA and  has been authorized for detection and/or diagnosis of SARS-CoV-2 by FDA under an Emergency Use Authorization (EUA). This EUA will remain  in effect (meaning this test can be used) for the duration of the COVID-19 declaration under Section 564(b)(1) of the Act, 21 U.S.C. section 360bbb-3(b)(1), unless the authorization is terminated or revoked sooner.    Influenza A by PCR NEGATIVE NEGATIVE Final   Influenza B by PCR NEGATIVE NEGATIVE Final    Comment: (NOTE) The  Xpert Xpress SARS-CoV-2/FLU/RSV assay is intended as an aid in  the diagnosis of influenza from Nasopharyngeal swab specimens and  should not be used as a sole basis for treatment. Nasal washings and  aspirates are unacceptable for Xpert Xpress SARS-CoV-2/FLU/RSV  testing. Fact Sheet for Patients: https://www.moore.com/https://www.fda.gov/media/142436/download Fact Sheet for Healthcare Providers: https://www.young.biz/https://www.fda.gov/media/142435/download This test is not yet approved or cleared by the [place] FDA and  has been authorized for detection and/or diagnosis of SARS-CoV-2 by  FDA under an Emergency Use Authorization (EUA). This EUA will remain  in effect (meaning this test can be used) for the duration of the  Covid-19 declaration under Section 564(b)(1) of the Act, 21  U.S.C. section 360bbb-3(b)(1), unless the authorization is  terminated or revoked. Performed at [facility], [address] [place], [place] [address]      Radiological Exams on Admission: DG Chest Port 1 View  Result Date: 03/24/2020 CLINICAL DATA:  Sepsis. EXAM: PORTABLE CHEST 1 VIEW COMPARISON:  January 04, 2020 FINDINGS: Heart size is enlarged. There is no pneumothorax or pleural effusion. There are airspace opacities at the lung bases bilaterally favored to represent areas of scarring atelectasis. There is no acute osseous abnormality. IMPRESSION: 1. Cardiomegaly. 2. Bibasilar airspace opacities favored to represent scarring atelectasis. Electronically Signed   By: Katherine Mantlehristopher  Green M.D.   On: 03/24/2020 18:50   US Abdomen Limited RUQ  Result Date: 03/25/2020 CLINICAL DATA:  Right upper quadrant pain EXAM: ULTRASOUND ABDOMEN LIMITED RIGHT UPPER QUADRANT COMPARISON:  None. FINDINGS: Gallbladder: Diffusely thickened gallbladder wall measuring up to 1 cm. No sonographic Murphy sign. Small amount of pericholecystic fluid is seen. Common bile duct: Diameter: 4.4 mm Liver: No focal lesion identified. Within normal limits in parenchymal  echogenicity. Portal vein is patent on color Doppler imaging with normal direction of blood flow towards the liver. Other: None. IMPRESSION: Diffusely thickened gallbladder wall with a small amount of pericholecystic fluid. This is nonspecific, could be due to acalculous cholecystitis Electronically Signed   By:  Prudencio Pair M.D.   On: 03/25/2020 00:13    EKG: Independently reviewed. Sinus tachycardia (rate 124), PVC.   Assessment/Plan   1. Abdominal pain, fever, jaundice  - Presents with worsening abdominal pain and nausea and is found to have fever, tachycardia, elevated lactate, elevated transaminases and bilirubin, and [place] with gallbladder wall thickening, no stones, and CBD 4.4 mm  - Acalculous cholecystitis considered, jaundice and elevated transaminases suggestive of cholangitis but CBD normal  - GI and surgery were consulted from ED, MRCP recommended and ordered  - Blood cultures collected in ED and Zosyn started  - Continue bowel rest, Zosyn, supportive care, check lipase, follow cultures and clinical course    ADDENDUM:  Lipase 1500 and radiologist called to say MRCP findings suggest acute pancreatitis. ?passed stone or transient obstruction. Continue Zosyn, IVF, bowel rest.    2. COPD  - Stable, continue albuterol as needed    3. Type II DM  - A1c was 6.9% in January 2020 - Hold metformin for now and use a low-intensity SSI with Novolog    4. Hypertension  - BP at goal  - Hold scheduled antihypertensives for now and treat as-needed only    DVT prophylaxis: SCDs Code Status: Full  Family Communication: Discussed with patient   Disposition Plan: Likely home in ~4 days after treatment of infection and consultant sign-off  Consults called: GI and surgery consulted by ED physician  Admission status: Inpatient     Vianne Bulls, MD [facility] Pager: See [email or web address]  If 7AM-7PM, please contact the daytime attending [email or web address]  03/25/2020, 1:09 AM

## 2020-03-25 NOTE — Progress Notes (Signed)
Pharmacy Antibiotic Note  Vincent Collins is a 80 y.o. male admitted on 03/24/2020 with intra-abdominal infection.  Pharmacy has been consulted for Zosyn dosing. WBC WNL. Renal function OK.   Plan: Zosyn 3.375G IV q8h to be infused over 4 hours  Trend WBC, temp, renal function  F/U infectious work-up   Height: 5\' 8"  (172.7 cm) Weight: 245 lb (111.1 kg) IBW/kg (Calculated) : 68.4  Temp (24hrs), Avg:101.1 F (38.4 C), Min:100.5 F (38.1 C), Max:101.7 F (38.7 C)  Recent Labs  Lab 03/24/20 1820 03/24/20 2036 03/25/20 0140  WBC 7.7  --   --   CREATININE 1.07  --   --   LATICACIDVEN 3.3* 2.7* 2.1*    Estimated Creatinine Clearance: 69.9 mL/min (by C-G formula based on SCr of 1.07 mg/dL).    Allergies  Allergen Reactions  . Diphenhydramine Hcl Other (See Comments)    Can tolerate    03/27/20, PharmD, BCPS Clinical Pharmacist Phone: [phone]

## 2020-03-26 ENCOUNTER — Inpatient Hospital Stay (HOSPITAL_COMMUNITY): Payer: Medicare Other

## 2020-03-26 ENCOUNTER — Encounter (HOSPITAL_COMMUNITY): Payer: Self-pay | Admitting: Family Medicine

## 2020-03-26 DIAGNOSIS — J9601 Acute respiratory failure with hypoxia: Secondary | ICD-10-CM

## 2020-03-26 DIAGNOSIS — I480 Paroxysmal atrial fibrillation: Secondary | ICD-10-CM

## 2020-03-26 DIAGNOSIS — A4151 Sepsis due to Escherichia coli [E. coli]: Principal | ICD-10-CM

## 2020-03-26 DIAGNOSIS — R6520 Severe sepsis without septic shock: Secondary | ICD-10-CM

## 2020-03-26 DIAGNOSIS — K8309 Other cholangitis: Secondary | ICD-10-CM

## 2020-03-26 DIAGNOSIS — I4891 Unspecified atrial fibrillation: Secondary | ICD-10-CM

## 2020-03-26 DIAGNOSIS — K851 Biliary acute pancreatitis without necrosis or infection: Secondary | ICD-10-CM

## 2020-03-26 LAB — HEPATIC FUNCTION PANEL
ALT: 184 U/L — ABNORMAL HIGH (ref 0–44)
AST: 120 U/L — ABNORMAL HIGH (ref 15–41)
Albumin: 2.6 g/dL — ABNORMAL LOW (ref 3.5–5.0)
Alkaline Phosphatase: 145 U/L — ABNORMAL HIGH (ref 38–126)
Bilirubin, Direct: 4.1 mg/dL — ABNORMAL HIGH (ref 0.0–0.2)
Indirect Bilirubin: 2.4 mg/dL — ABNORMAL HIGH (ref 0.3–0.9)
Total Bilirubin: 6.5 mg/dL — ABNORMAL HIGH (ref 0.3–1.2)
Total Protein: 5 g/dL — ABNORMAL LOW (ref 6.5–8.1)

## 2020-03-26 LAB — CBC WITH DIFFERENTIAL/PLATELET
Abs Immature Granulocytes: 0.36 10*3/uL — ABNORMAL HIGH (ref 0.00–0.07)
Basophils Absolute: 0 10*3/uL (ref 0.0–0.1)
Basophils Relative: 0 %
Eosinophils Absolute: 0 10*3/uL (ref 0.0–0.5)
Eosinophils Relative: 0 %
HCT: 42.2 % (ref 39.0–52.0)
Hemoglobin: 14.4 g/dL (ref 13.0–17.0)
Immature Granulocytes: 2 %
Lymphocytes Relative: 4 %
Lymphs Abs: 0.6 10*3/uL — ABNORMAL LOW (ref 0.7–4.0)
MCH: 30.1 pg (ref 26.0–34.0)
MCHC: 34.1 g/dL (ref 30.0–36.0)
MCV: 88.3 fL (ref 80.0–100.0)
Monocytes Absolute: 1 10*3/uL (ref 0.1–1.0)
Monocytes Relative: 6 %
Neutro Abs: 14.7 10*3/uL — ABNORMAL HIGH (ref 1.7–7.7)
Neutrophils Relative %: 88 %
Platelets: 137 10*3/uL — ABNORMAL LOW (ref 150–400)
RBC: 4.78 MIL/uL (ref 4.22–5.81)
RDW: 13.3 % (ref 11.5–15.5)
WBC: 16.7 10*3/uL — ABNORMAL HIGH (ref 4.0–10.5)
nRBC: 0 % (ref 0.0–0.2)

## 2020-03-26 LAB — RENAL FUNCTION PANEL
Albumin: 2.6 g/dL — ABNORMAL LOW (ref 3.5–5.0)
Anion gap: 13 (ref 5–15)
BUN: 18 mg/dL (ref 8–23)
CO2: 21 mmol/L — ABNORMAL LOW (ref 22–32)
Calcium: 7.8 mg/dL — ABNORMAL LOW (ref 8.9–10.3)
Chloride: 105 mmol/L (ref 98–111)
Creatinine, Ser: 1.13 mg/dL (ref 0.61–1.24)
GFR calc Af Amer: >60 mL/min (ref >60)
GFR calc non Af Amer: >60 mL/min (ref >60)
Glucose, Bld: 147 mg/dL — ABNORMAL HIGH (ref 70–99)
Phosphorus: 2.5 mg/dL (ref 2.5–4.6)
Potassium: 3.8 mmol/L (ref 3.5–5.1)
Sodium: 139 mmol/L (ref 135–145)

## 2020-03-26 LAB — GLUCOSE, CAPILLARY
Glucose-Capillary: 116 mg/dL — ABNORMAL HIGH (ref 70–99)
Glucose-Capillary: 125 mg/dL — ABNORMAL HIGH (ref 70–99)
Glucose-Capillary: 125 mg/dL — ABNORMAL HIGH (ref 70–99)
Glucose-Capillary: 129 mg/dL — ABNORMAL HIGH (ref 70–99)
Glucose-Capillary: 132 mg/dL — ABNORMAL HIGH (ref 70–99)
Glucose-Capillary: 134 mg/dL — ABNORMAL HIGH (ref 70–99)
Glucose-Capillary: 141 mg/dL — ABNORMAL HIGH (ref 70–99)

## 2020-03-26 LAB — BLOOD GAS, ARTERIAL
Acid-Base Excess: 1.4 mmol/L (ref 0.0–2.0)
Bicarbonate: 25.3 mmol/L (ref 20.0–28.0)
Drawn by: 53527
FIO2: 32
O2 Saturation: 98.7 %
Patient temperature: 36.9
pCO2 arterial: 38.5 mmHg (ref 32.0–48.0)
pH, Arterial: 7.433 (ref 7.350–7.450)
pO2, Arterial: 123 mmHg — ABNORMAL HIGH (ref 83.0–108.0)

## 2020-03-26 LAB — LIPASE, BLOOD: Lipase: 571 U/L — ABNORMAL HIGH (ref 11–51)

## 2020-03-26 LAB — TROPONIN I (HIGH SENSITIVITY)
Troponin I (High Sensitivity): 34 ng/L — ABNORMAL HIGH (ref <18)
Troponin I (High Sensitivity): 38 ng/L — ABNORMAL HIGH (ref <18)

## 2020-03-26 LAB — BRAIN NATRIURETIC PEPTIDE: B Natriuretic Peptide: 230 pg/mL — ABNORMAL HIGH (ref 0.0–100.0)

## 2020-03-26 LAB — MAGNESIUM: Magnesium: 2.2 mg/dL (ref 1.7–2.4)

## 2020-03-26 LAB — D-DIMER, QUANTITATIVE: D-Dimer, Quant: 6.11 ug/mL-FEU — ABNORMAL HIGH (ref 0.00–0.50)

## 2020-03-26 IMAGING — CT CT ANGIO CHEST
2 of 7 series · 17 of 46 positions shown · IV contrast (APPLIED)
Comparison: Plain film from earlier in the same day, MRI from the
previous day.

CLINICAL DATA: Shortness of breath

EXAM:
CT ANGIOGRAPHY CHEST WITH CONTRAST
TECHNIQUE: Multidetector CT imaging of the chest was performed using the
standard protocol during bolus administration of intravenous
contrast. Multiplanar CT image reconstructions and MIPs were
obtained to evaluate the vascular anatomy.
CONTRAST:  100mL OMNIPAQUE IOHEXOL 350 MG/ML SOLN

[Series 8: thins · axial · 0.75mm/px · z∈[+1183,+1440]mm · 14 of 416 slices shown]
[im 24/416  lung]
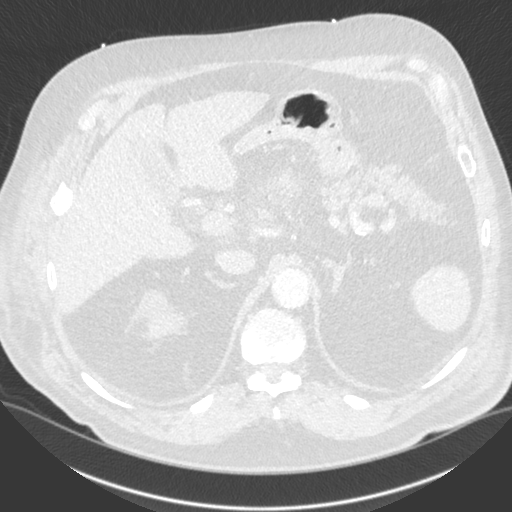
[im 47/416  soft-tissue]
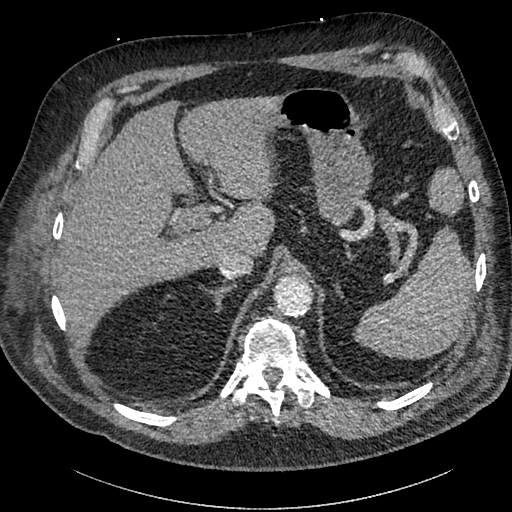
[im 93/416  lung]
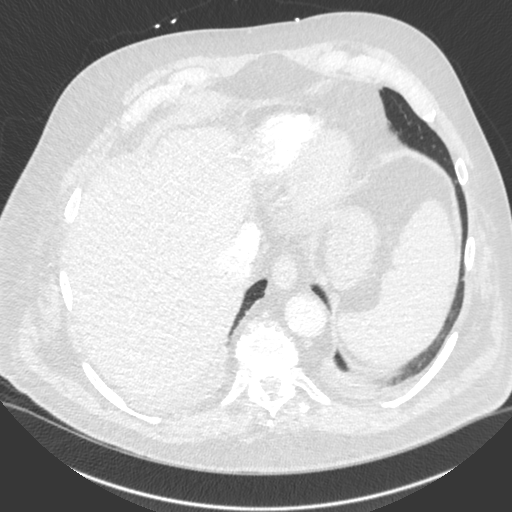
[im 116/416  soft-tissue]
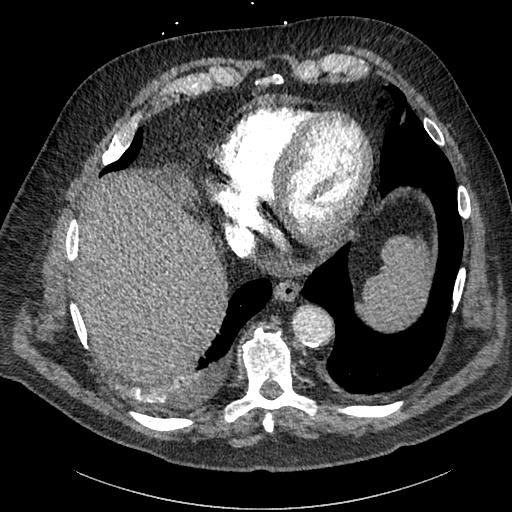
[im 139/416  lung]
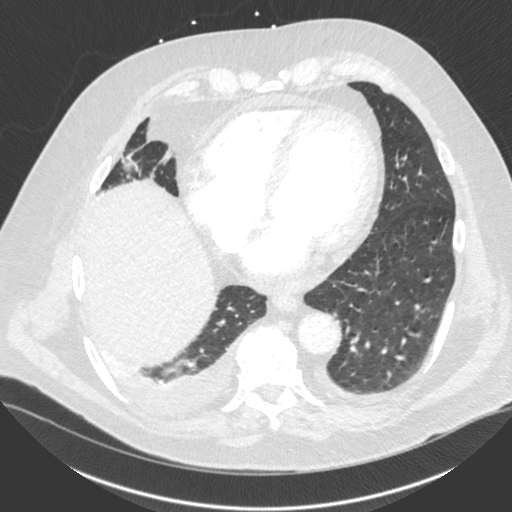
[im 162/416  soft-tissue]
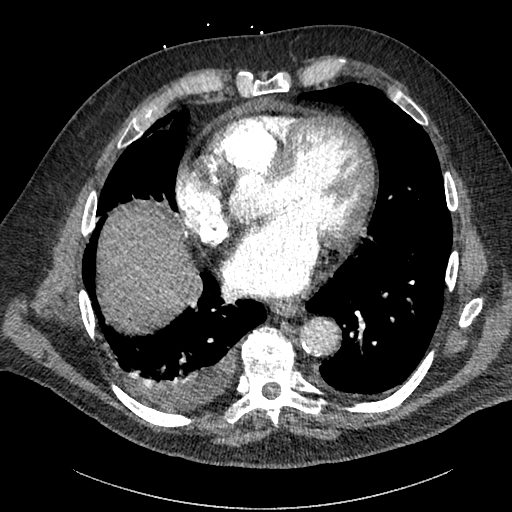
[im 185/416  lung]
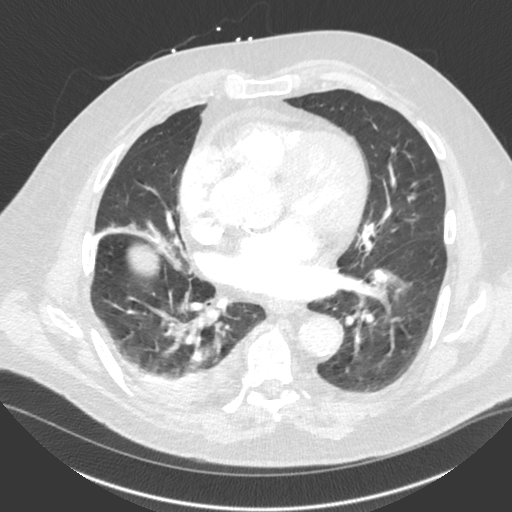
[im 231/416  soft-tissue]
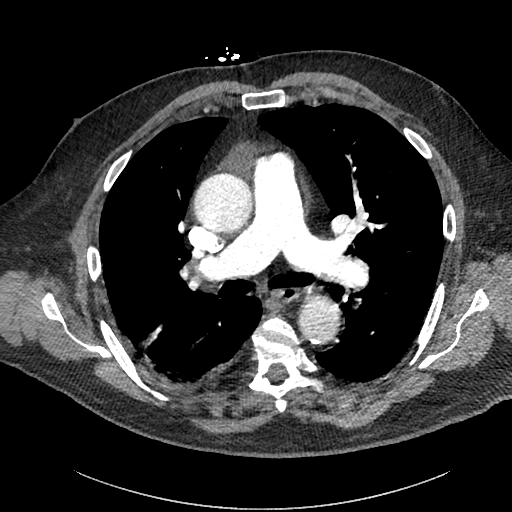
[im 254/416  lung]
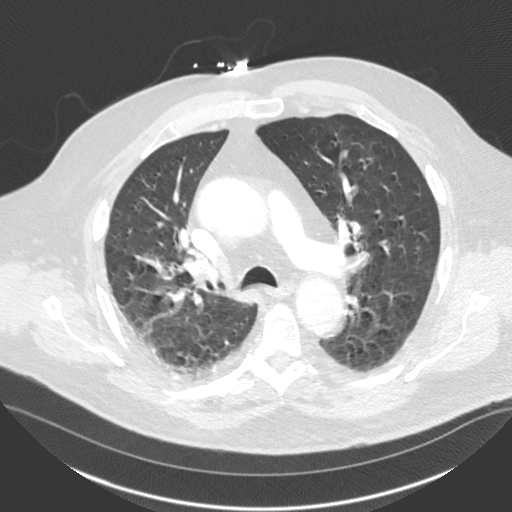
[im 277/416  soft-tissue]
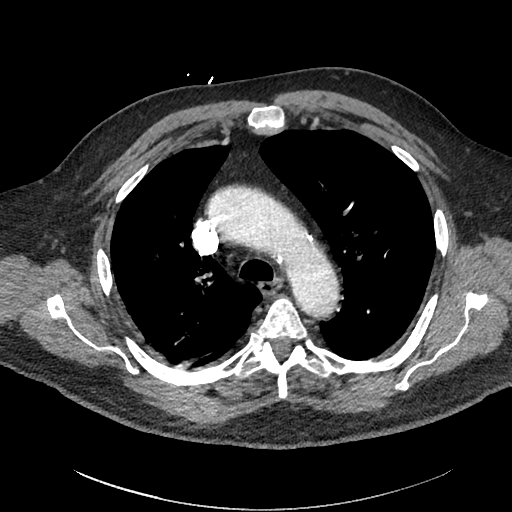
[im 300/416  lung]
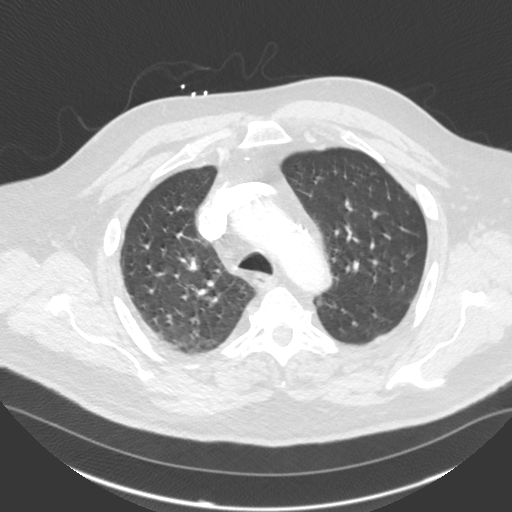
[im 323/416  soft-tissue]
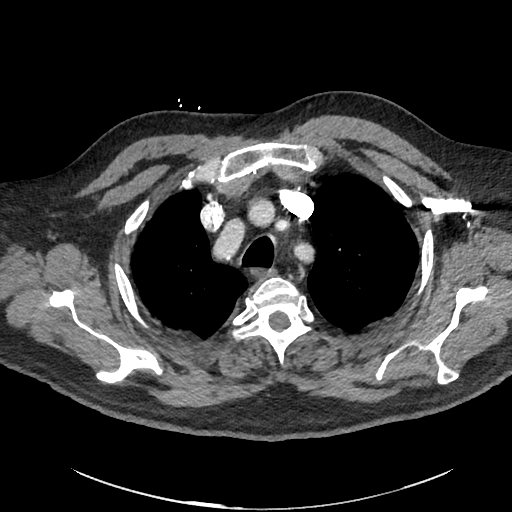
[im 369/416  lung]
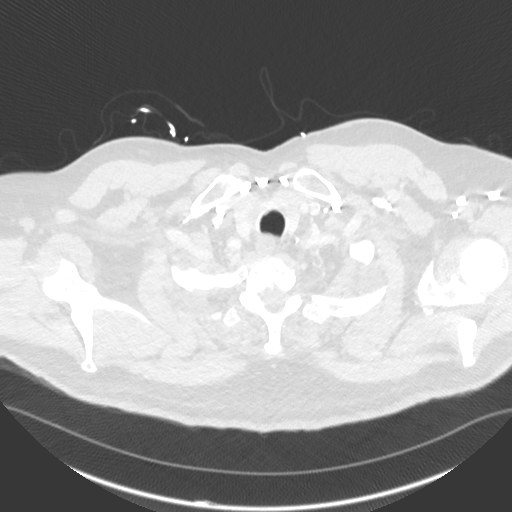
[im 392/416  soft-tissue]
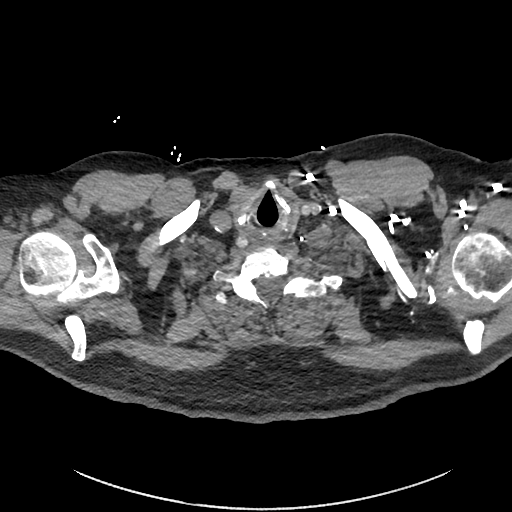

[Series 9: cor · coronal · 0.56mm/px · 3 of 173 slices shown]
[im 44/173  soft-tissue]
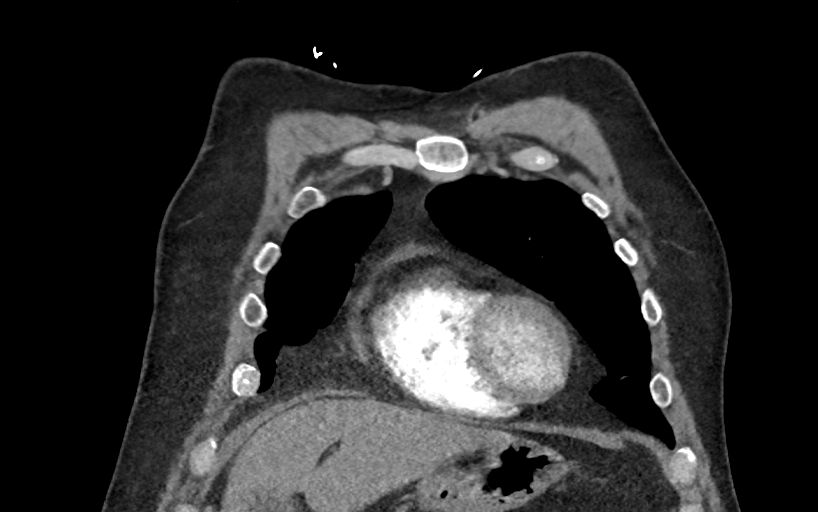
[im 87/173  soft-tissue]
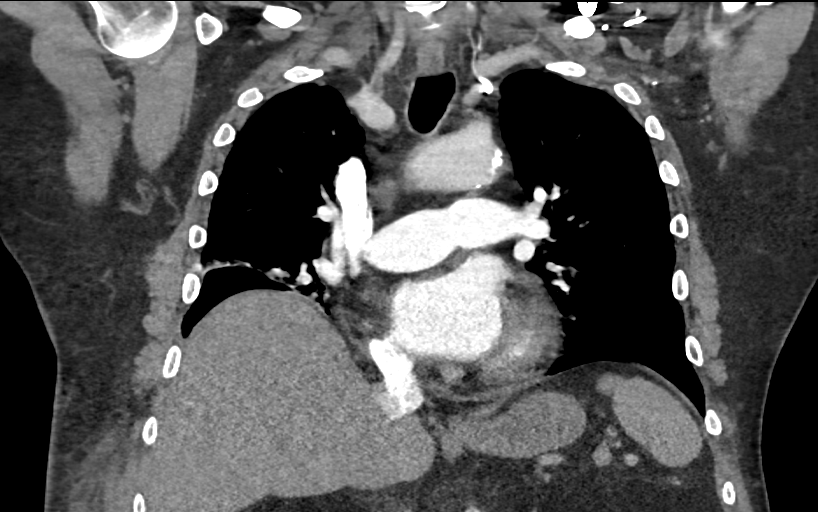
[im 130/173  soft-tissue]
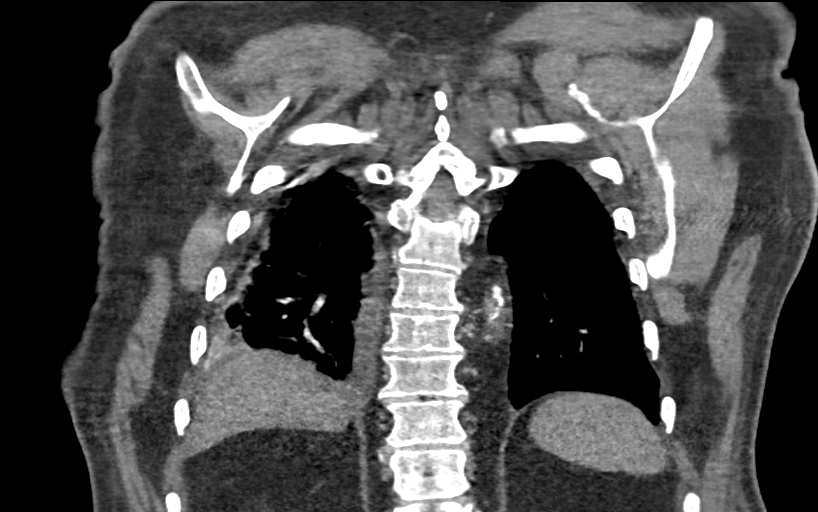

[17 of 46 positions shown; findings below may reference images not displayed]

FINDINGS: Cardiovascular: Thoracic aorta and its branches demonstrate
atherosclerotic calcifications without aneurysmal dilatation. No
dissection is seen. No cardiac enlargement is seen. Minimal
pericardial fluid is noted. Coronary calcifications are seen. The
pulmonary artery shows a normal branching pattern. No definitive
filling defects to suggest pulmonary emboli are noted.

Mediastinum/Nodes: Thoracic inlet is within normal limits. No hilar
or mediastinal adenopathy is noted. The esophagus as visualized is
within normal limits.

Lungs/Pleura: Considerable motion artifact is noted. The left lung
is well aerated without focal infiltrate or sizable effusion. Small
right-sided pleural effusion and right lower lobe atelectatic
changes are seen. No focal confluent infiltrate is noted. No
parenchymal nodules are seen.

Upper Abdomen: Visualized upper abdomen again demonstrates some mild
peripancreatic inflammatory change consistent with the known history
of pancreatitis. Renal cysts are seen.

Musculoskeletal: Degenerative changes of the thoracic spine are
seen. Healing rib fractures are noted on the right involving the
sixth through eighth ribs anteriorly. No definitive left rib
fractures are seen. No compression deformities are seen.

Review of the MIP images confirms the above findings.
IMPRESSION: Multiple right-sided rib fractures with healing. A small right
effusion is noted which is likely compensatory in nature. Mild
associated atelectasis is noted. No pneumothorax is seen.

Mild changes of pancreatitis similar to that noted on prior MRI from
the previous day.

Aortic Atherosclerosis ([UQ]-[UQ]).

## 2020-03-26 IMAGING — DX DG CHEST 1V PORT
1 series · 1 of 1 positions shown · non-contrast
Comparison: [DATE]

CLINICAL DATA: Shortness of breath with cough

EXAM:
PORTABLE CHEST 1 VIEW

[chest ap]
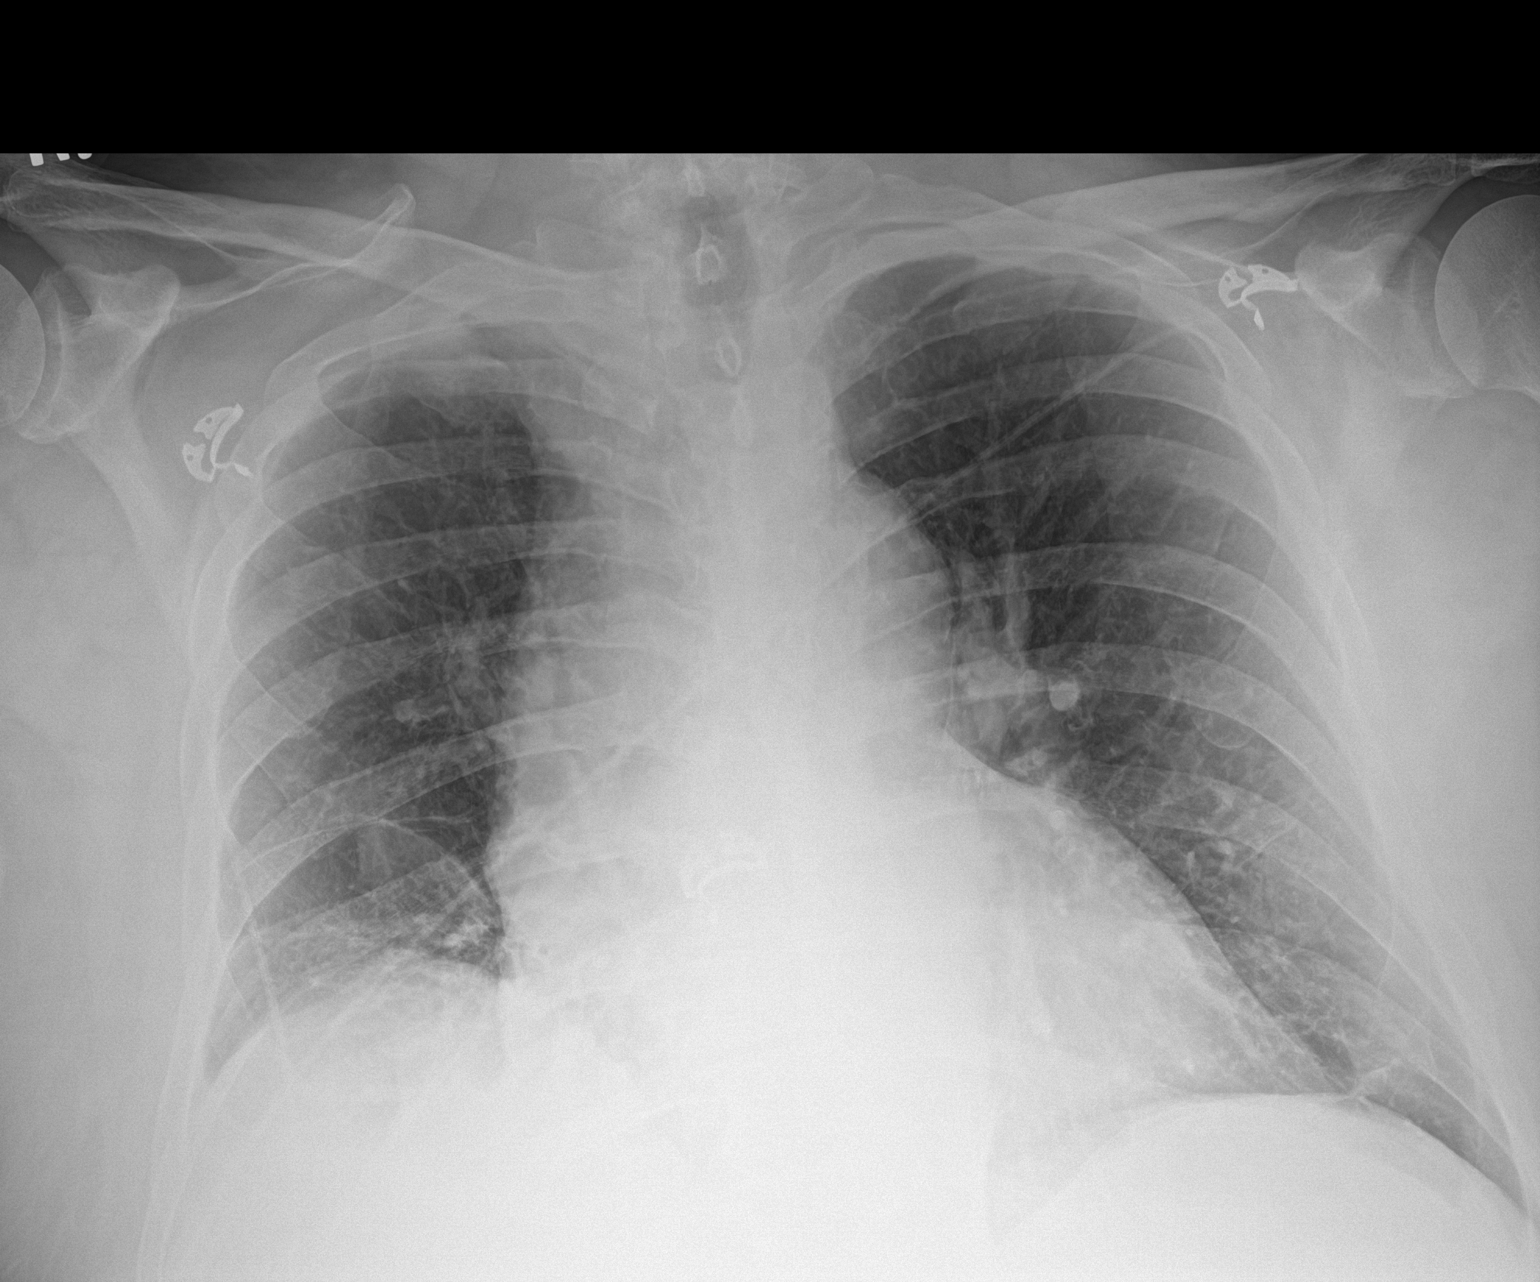

[1 of 1 positions shown; findings below may reference images not displayed]

FINDINGS: Low volume chest with prominence of the cardiomediastinal
silhouette.

Mild atelectasis or scar like appearance at the bases. There is no
edema, consolidation, effusion, or pneumothorax.

No acute osseous finding.
IMPRESSION: Minimal scarring or atelectasis at the bases.

## 2020-03-26 IMAGING — DX DG CHEST 1V PORT
1 series · 1 of 1 positions shown · non-contrast
Comparison: [DATE] at [DATE] a.m.

CLINICAL DATA: Short of breath, COPD, previous tobacco abuse,
diabetes, hypertension

EXAM:
PORTABLE CHEST 1 VIEW

[chest ap]
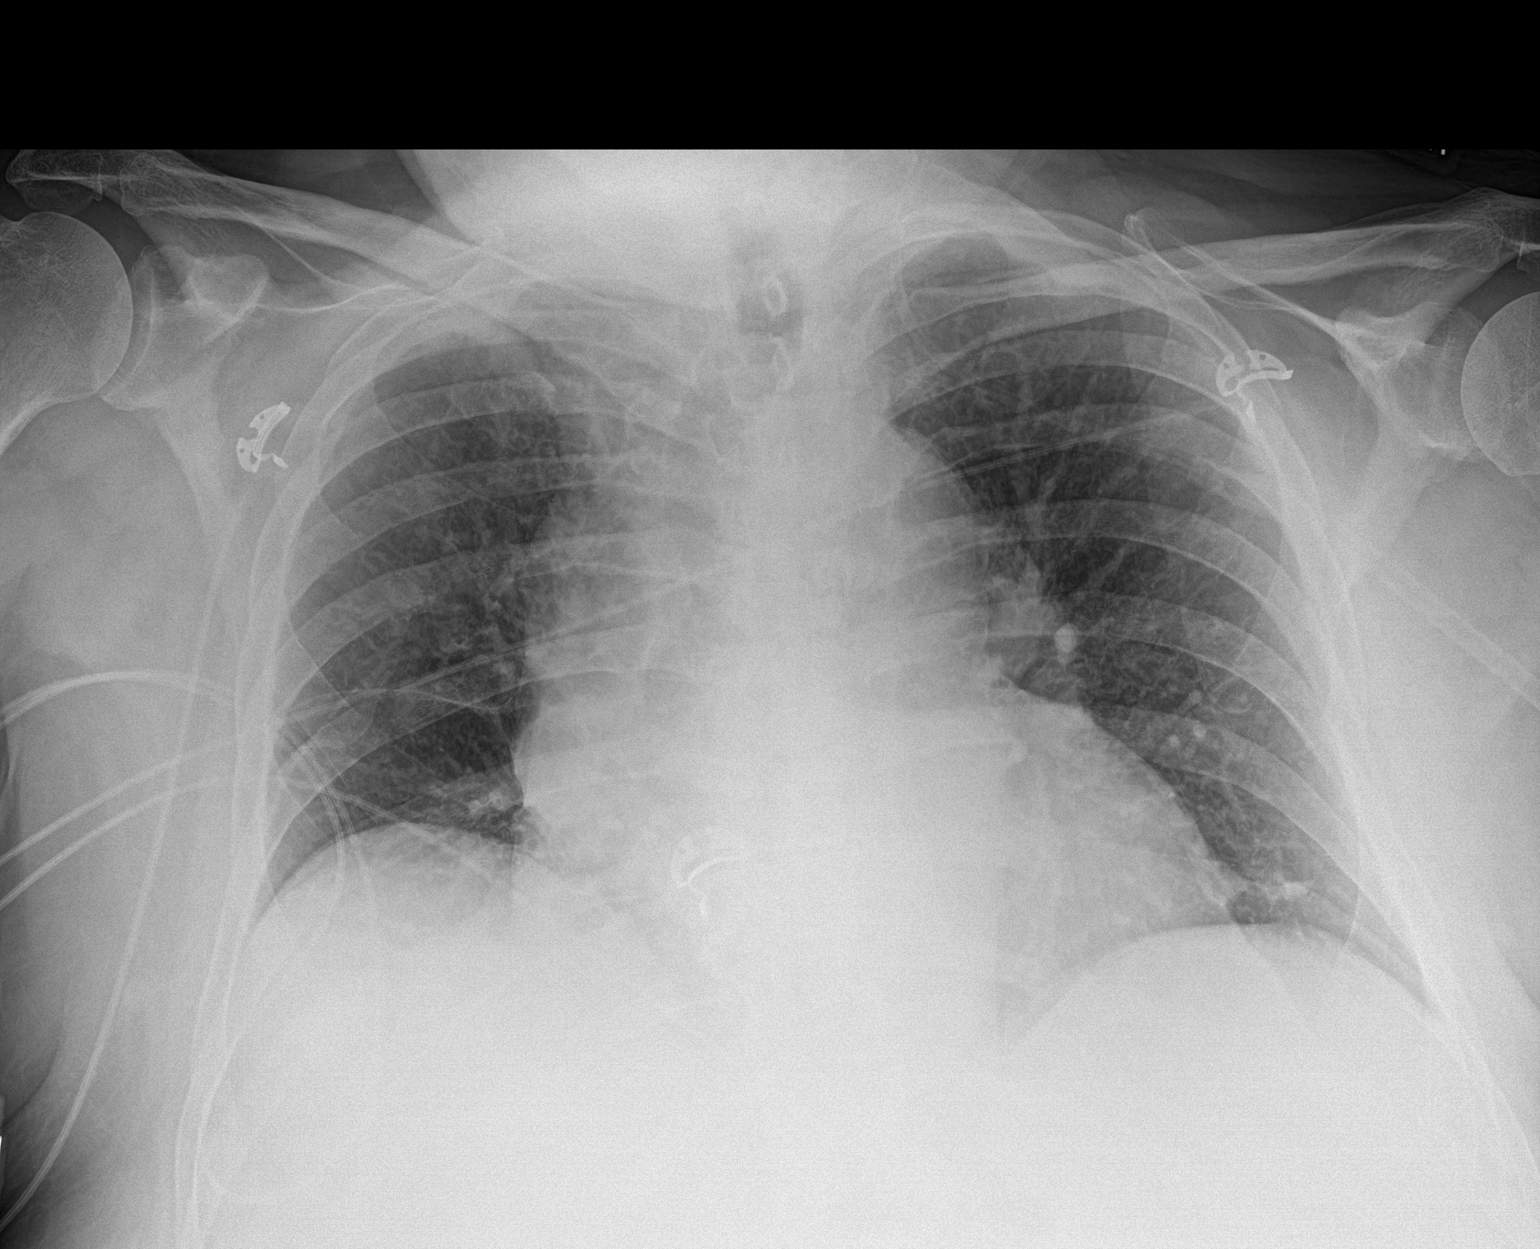

[1 of 1 positions shown; findings below may reference images not displayed]

FINDINGS: Single frontal view of the chest demonstrates a stable cardiac
silhouette. No airspace disease, effusion, or pneumothorax. Improved
aeration at the lung bases. No acute bony abnormalities.
IMPRESSION: 1. No acute process.

## 2020-03-26 MED ORDER — IOHEXOL 350 MG/ML SOLN
100.0000 mL | Freq: Once | INTRAVENOUS | Status: AC | PRN
  Administered 2020-03-26: 100 mL via INTRAVENOUS

## 2020-03-26 MED ORDER — DILTIAZEM HCL-DEXTROSE 125-5 MG/125ML-% IV SOLN (PREMIX)
5.0000 mg/h | INTRAVENOUS | Status: DC
  Filled 2020-03-26: qty 125

## 2020-03-26 MED ORDER — FUROSEMIDE 10 MG/ML IJ SOLN
40.0000 mg | Freq: Once | INTRAMUSCULAR | Status: AC
  Administered 2020-03-26: 40 mg via INTRAVENOUS
  Filled 2020-03-26: qty 4

## 2020-03-26 MED ORDER — LEVALBUTEROL HCL 0.63 MG/3ML IN NEBU
0.6300 mg | INHALATION_SOLUTION | Freq: Four times a day (QID) | RESPIRATORY_TRACT | Status: DC | PRN
  Administered 2020-03-27 (×2): 0.63 mg via RESPIRATORY_TRACT
  Filled 2020-03-26 (×2): qty 3

## 2020-03-26 MED ORDER — TAMSULOSIN HCL 0.4 MG PO CAPS
0.8000 mg | ORAL_CAPSULE | Freq: Every day | ORAL | Status: DC
  Administered 2020-03-26 – 2020-03-29 (×4): 0.8 mg via ORAL
  Filled 2020-03-26 (×4): qty 2

## 2020-03-26 MED ORDER — FLUTICASONE PROPIONATE 50 MCG/ACT NA SUSP
2.0000 | Freq: Every day | NASAL | Status: DC
  Administered 2020-03-26 – 2020-03-30 (×5): 2 via NASAL
  Filled 2020-03-26 (×2): qty 16

## 2020-03-26 MED ORDER — FLUTICASONE PROPIONATE 50 MCG/ACT NA SUSP
1.0000 | Freq: Two times a day (BID) | NASAL | Status: DC | PRN
  Filled 2020-03-26: qty 16

## 2020-03-26 MED ORDER — HEPARIN (PORCINE) 25000 UT/250ML-% IV SOLN
1400.0000 [IU]/h | INTRAVENOUS | Status: DC
  Filled 2020-03-26 (×2): qty 250

## 2020-03-26 MED ORDER — METOPROLOL TARTRATE 25 MG PO TABS
25.0000 mg | ORAL_TABLET | Freq: Three times a day (TID) | ORAL | Status: DC
  Administered 2020-03-26: 25 mg via ORAL
  Filled 2020-03-26: qty 1

## 2020-03-26 MED ORDER — HEPARIN SODIUM (PORCINE) 5000 UNIT/ML IJ SOLN
5000.0000 [IU] | Freq: Three times a day (TID) | INTRAMUSCULAR | Status: DC
  Administered 2020-03-26 – 2020-03-28 (×5): 5000 [IU] via SUBCUTANEOUS
  Filled 2020-03-26 (×5): qty 1

## 2020-03-26 MED ORDER — METOPROLOL TARTRATE 25 MG PO TABS: 25.0000 mg | ORAL_TABLET | Freq: Four times a day (QID) | ORAL | Status: DC

## 2020-03-26 MED ORDER — GUAIFENESIN 100 MG/5ML PO SOLN
5.0000 mL | ORAL | Status: DC | PRN
  Administered 2020-03-27 – 2020-03-29 (×6): 100 mg via ORAL
  Filled 2020-03-26: qty 25
  Filled 2020-03-26 (×4): qty 5
  Filled 2020-03-26: qty 25
  Filled 2020-03-26: qty 5

## 2020-03-26 MED ORDER — METOPROLOL TARTRATE 25 MG PO TABS
25.0000 mg | ORAL_TABLET | Freq: Two times a day (BID) | ORAL | Status: DC
  Administered 2020-03-26 – 2020-03-30 (×8): 25 mg via ORAL
  Filled 2020-03-26 (×9): qty 1

## 2020-03-26 MED ORDER — HEPARIN BOLUS VIA INFUSION
5000.0000 [IU] | Freq: Once | INTRAVENOUS | Status: DC
  Filled 2020-03-26: qty 5000

## 2020-03-26 NOTE — Progress Notes (Signed)
[facility] Progress Note     Subjective: CC-  Abdomen sore but pain improved. Feeling hungry. Denies n/v. Lipase down to 571. Transaminases trending down but Tbili up 6.5 from 5.1. Feeling short of breath. Thinks it is due to phlegm. States that he uses flonase at home which helps with phelgm.  Objective: Vital signs in last 24 hours: Temp:  [98.3 F (36.8 C)-98.6 F (37 C)] 98.6 F (37 C) (03/29 0433) Pulse Rate:  [72-101] 101 (03/29 0433) Resp:  [18-20] 18 (03/29 0433) BP: (103-162)/(69-91) 162/79 (03/29 0433) SpO2:  [93 %-95 %] 94 % (03/29 0433) Last BM Date: 03/25/20  Intake/Output from previous day: 03/28 0701 - 03/29 0700 In: 1543.1 [I.V.:1237.1; IV Piggyback:306] Out: 800 [Urine:800] Intake/Output this shift: Total I/O In: -  Out: 100 [Urine:100]  PE: Gen:  Alert, NAD, pleasant HEENT: EOM's intact, pupils equal and round Card:  RRR Pulm:  Tachypneic, CTAB, no W/R/R Abd: obese, soft, mild epigastric TTP without rebound or guarding, +BS, no HSM Skin: no rashes noted, warm and dry  Lab Results:  Recent Labs    03/25/20 0420 03/26/20 0256  WBC 20.4* 16.7*  HGB 15.0 14.4  HCT 44.7 42.2  PLT 161 137*   BMET Recent Labs    03/25/20 0420 03/26/20 0256  NA 138 139  K 3.3* 3.8  CL 104 105  CO2 22 21*  GLUCOSE 178* 147*  BUN 19 18  CREATININE 1.49* 1.13  CALCIUM 8.1* 7.8*   PT/INR Recent Labs    03/24/20 1820 03/25/20 0420  LABPROT 13.2 14.3  INR 1.0 1.1   CMP     Component Value Date/Time   NA 139 03/26/2020 0256   NA 141 01/04/2019 1117   K 3.8 03/26/2020 0256   CL 105 03/26/2020 0256   CO2 21 (L) 03/26/2020 0256   GLUCOSE 147 (H) 03/26/2020 0256   BUN 18 03/26/2020 0256   BUN 19 01/04/2019 1117   CREATININE 1.13 03/26/2020 0256   CALCIUM 7.8 (L) 03/26/2020 0256   PROT 5.0 (L) 03/26/2020 0256   PROT 6.2 01/04/2019 1117   ALBUMIN 2.6 (L) 03/26/2020 0256   ALBUMIN 2.6 (L) 03/26/2020 0256   ALBUMIN 4.1 01/04/2019 1117    AST 120 (H) 03/26/2020 0256   ALT 184 (H) 03/26/2020 0256   ALKPHOS 145 (H) 03/26/2020 0256   BILITOT 6.5 (H) 03/26/2020 0256   BILITOT 1.0 01/04/2019 1117   GFRNONAA >60 03/26/2020 0256   GFRAA >60 03/26/2020 0256   Lipase     Component Value Date/Time   LIPASE 571 (H) 03/26/2020 0256       Studies/Results: DG Chest Port 1 View  Result Date: 03/24/2020 CLINICAL DATA:  Sepsis. EXAM: PORTABLE CHEST 1 VIEW COMPARISON:  January 04, 2020 FINDINGS: Heart size is enlarged. There is no pneumothorax or pleural effusion. There are airspace opacities at the lung bases bilaterally favored to represent areas of scarring atelectasis. There is no acute osseous abnormality. IMPRESSION: 1. Cardiomegaly. 2. Bibasilar airspace opacities favored to represent scarring atelectasis. Electronically Signed   By: Constance Holster M.D.   On: 03/24/2020 18:50   MR ABDOMEN MRCP W WO CONTAST  Result Date: 03/25/2020 CLINICAL DATA:  Jaundice. Abdominal pain, nausea and vomiting. Chills. COPD. EXAM: MRI ABDOMEN WITHOUT AND WITH CONTRAST (INCLUDING MRCP) TECHNIQUE: Multiplanar multisequence MR imaging of the abdomen was performed both before and after the administration of intravenous contrast. Heavily T2-weighted images of the biliary and pancreatic ducts were obtained, and three-dimensional MRCP images  were rendered by post processing. CONTRAST:  37mL GADAVIST GADOBUTROL 1 MMOL/ML IV SOLN COMPARISON:  Abdominal sonogram from earlier today. 09/11/2014 CT abdomen/pelvis. FINDINGS: Lower chest: No acute abnormality at the lung bases. Hepatobiliary: Normal liver size and configuration. Mild diffuse hepatic steatosis. Scattered simple subcentimeter left liver lobe cysts. No suspicious liver masses. Moderate diffuse gallbladder wall thickening. No gallstones. Gallbladder is nondistended. No significant pericholecystic fluid. No biliary ductal dilatation. Common bile duct diameter 4 mm. No biliary filling defects to suggest  choledocholithiasis. No biliary strictures, masses or beading. Pancreas: Diffuse pancreatic parenchymal and peripancreatic edema compatible with acute pancreatitis. No pancreatic duct dilation. No pancreas divisum. Preserved pancreatic parenchymal enhancement. Tiny nonenhancing 0.5 cm pancreatic tail cystic lesion compatible with a pseudocyst (series 18/image 11). No additional pancreatic lesions. No measurable peripancreatic fluid collections. Spleen: Normal size. No mass. Adrenals/Urinary Tract: Normal adrenals. No hydronephrosis. Exophytic minimally complex renal cysts in the upper left kidney measuring 1.7 cm medially (series 5/image 24) and 2.1 cm posteriorly (series 5/image 26), both demonstrating a tiny amount of layering hemorrhagic/proteinaceous material without enhancement, compatible with Bosniak category 2 hemorrhagic/proteinaceous renal cysts. Additional simple renal cysts scattered in both kidneys, largest 2.5 cm in the interpolar right kidney. No suspicious renal masses. Stomach/Bowel: Normal non-distended stomach. Visualized small and large bowel is normal caliber, with no bowel wall thickening. Colonic diverticulosis. Vascular/Lymphatic: Atherosclerotic nonaneurysmal abdominal aorta. Patent portal, splenic, hepatic and renal veins. No pathologically enlarged lymph nodes in the abdomen. Other: No abdominal ascites or focal fluid collection. Musculoskeletal: No aggressive appearing focal osseous lesions. IMPRESSION: 1. Acute non-necrotizing pancreatitis. No cholelithiasis or choledocholithiasis. No biliary or pancreatic duct dilation. Tiny 0.5 cm pancreatic tail pseudocyst. No measurable peripancreatic fluid collections. 2. Moderate diffuse gallbladder wall thickening, nonspecific, probably reactive or due to noninflammatory edema. 3. Mild diffuse hepatic steatosis. 4. Colonic diverticulosis. 5. Bosniak category 1 and category 2 renal cysts. No suspicious renal masses. 6.  Aortic Atherosclerosis  (ICD10-I70.0). Electronically Signed   By: Delbert Phenix M.D.   On: 03/25/2020 12:21   US Abdomen Limited RUQ  Result Date: 03/25/2020 CLINICAL DATA:  Right upper quadrant pain EXAM: ULTRASOUND ABDOMEN LIMITED RIGHT UPPER QUADRANT COMPARISON:  None. FINDINGS: Gallbladder: Diffusely thickened gallbladder wall measuring up to 1 cm. No sonographic Murphy sign. Small amount of pericholecystic fluid is seen. Common bile duct: Diameter: 4.4 mm Liver: No focal lesion identified. Within normal limits in parenchymal echogenicity. Portal vein is patent on color Doppler imaging with normal direction of blood flow towards the liver. Other: None. IMPRESSION: Diffusely thickened gallbladder wall with a small amount of pericholecystic fluid. This is nonspecific, could be due to acalculous cholecystitis Electronically Signed   By: Jonna Clark M.D.   On: 03/25/2020 00:13    Anti-infectives: Anti-infectives (From admission, onward)   Start     Dose/Rate Route Frequency Ordered Stop   03/25/20 1415  cefTRIAXone (ROCEPHIN) 2 g in sodium chloride 0.9 % 100 mL IVPB     2 g 200 mL/hr over 30 Minutes Intravenous Every 24 hours 03/25/20 1405     03/25/20 1415  metroNIDAZOLE (FLAGYL) IVPB 500 mg     500 mg 100 mL/hr over 60 Minutes Intravenous Every 8 hours 03/25/20 1405     03/25/20 0300  piperacillin-tazobactam (ZOSYN) IVPB 3.375 g  Status:  Discontinued     3.375 g 12.5 mL/hr over 240 Minutes Intravenous Every 8 hours 03/25/20 0252 03/25/20 1405   03/24/20 1915  piperacillin-tazobactam (ZOSYN) IVPB 3.375 g  3.375 g 100 mL/hr over 30 Minutes Intravenous  Once 03/24/20 1910 03/24/20 1959       Assessment/Plan HTN COPD HLD GERD DM Shortness of breath - check CXR. Order home flonase and add flutter valve and robitussin PRN phlegm  Pancreatitis of unknown etiology ?Acalculous cholecystitis - triglycerides normal, denies alcohol use - no evidence of gallstones on imaging (u/s and MRCP)   FEN -  NPO/IVFs VTE - SCDs, ok for chemical prophylaxis from our standpoint ID - zosyn 3/27>>3/28, rocephin/flagyl 3/28>> Foley - none Follow up - TBD  Plan: Continue medical management of pancreatitis. GI following. Continue to trend lipase and LFTs. After resolution of pancreatitis may consider cholecystectomy to rule out biliary source.   LOS: 1 day    Franne Forts, [facility] 03/26/2020, 9:25 AM Please see Amion for pager number during day hours 7:00am-4:30pm

## 2020-03-26 NOTE — Progress Notes (Signed)
Per Dr. Scharlene Gloss, patient converted, no need for IV heparin. Resume 25mg  BID metoprolol. If difficulty with rate control, can consider IV diltiazem later.

## 2020-03-26 NOTE — Progress Notes (Signed)
ANTICOAGULATION CONSULT NOTE - Initial Consult  Pharmacy Consult for heparin Indication: atrial fibrillation  Allergies  Allergen Reactions  . Diphenhydramine Hcl Other (See Comments)    Can tolerate    Patient Measurements: Height: 5\' 8"  (172.7 cm) Weight: 245 lb (111.1 kg) IBW/kg (Calculated) : 68.4 Heparin Dosing Weight: 93.2 kg  Vital Signs: Temp: 97.9 F (36.6 C) (03/29 1318) Temp Source: Oral (03/29 1318) BP: 166/91 (03/29 1318) Pulse Rate: 110 (03/29 1551)  Labs: Recent Labs    03/24/20 1820 03/24/20 1820 03/25/20 0420 03/26/20 0256  HGB 16.1   < > 15.0 14.4  HCT 47.7  --  44.7 42.2  PLT 178  --  161 137*  LABPROT 13.2  --  14.3  --   INR 1.0  --  1.1  --   CREATININE 1.07  --  1.49* 1.13   < > = values in this interval not displayed.    Estimated Creatinine Clearance: 66.2 mL/min (by C-G formula based on SCr of 1.13 mg/dL).   Medical History: Past Medical History:  Diagnosis Date  . Broken leg and ribs, right, closed, initial encounter   . COPD (chronic obstructive pulmonary disease) ([place])   . Diabetes mellitus without complication ([place])   . Diverticulitis   . GERD (gastroesophageal reflux disease)   . Hydronephrosis of left kidney   . Hyperlipidemia   . Hypertension   . Left ureteral stone    obstructing  . Renal calculus or stone 2015    Medications:  Medications Prior to Admission  Medication Sig Dispense Refill Last Dose  . albuterol (PROAIR HFA) 108 (90 Base) MCG/ACT inhaler USE 2 PUFFS EVERY 6 HOURS  AS NEEDED FOR WHEEZING (Patient taking differently: Inhale 1-2 puffs into the lungs every 6 (six) hours as needed for wheezing. ) 8.5 g 5 unknown  . albuterol (PROVENTIL) (2.5 MG/3ML) 0.083% nebulizer solution Take 3 mLs (2.5 mg total) by nebulization 4 (four) times daily as needed for wheezing or shortness of breath. 360 mL 1 unknown  . aspirin 81 MG EC tablet Take 81 mg by mouth every evening.    03/23/2020 at Unknown time  . atorvastatin  (LIPITOR) 40 MG tablet TAKE 1 TABLET BY MOUTH  DAILY (Patient taking differently: Take 40 mg by mouth every evening. ) 90 tablet 0 03/23/2020 at Unknown time  . Cholecalciferol (VITAMIN D) 1000 UNITS capsule Take 1,000 Units by mouth in the morning.    03/24/2020 at Unknown time  . Coenzyme Q10-Fish Oil-Vit E (CO-Q 10 OMEGA-3 FISH OIL) CAPS Take 1 capsule by mouth in the morning. 100mg  Capsule Qunol   03/24/2020 at Unknown time  . fluticasone (FLONASE) 50 MCG/ACT nasal spray USE 1 SPRAY INTO BOTH  NOSTRILS 2 TIMES DAILY AS  NEEDED FOR ALLERGIES OR  RHINITIS (Patient taking differently: Place 1 spray into both nostrils 2 (two) times daily as needed for allergies. FOR ALLERGIES OR  RHINITIS.) 48 g 0 03/24/2020 at Unknown time  . lisinopril (ZESTRIL) 20 MG tablet TAKE 1 TABLET BY MOUTH  DAILY (Patient taking differently: Take 20 mg by mouth every morning. ) 90 tablet 0 03/24/2020 at Unknown time  . metFORMIN (GLUCOPHAGE-XR) 500 MG 24 hr tablet TAKE 1 TABLET BY MOUTH TWO  TIMES DAILY (Patient taking differently: Take 500 mg by mouth in the morning and at bedtime. ) 180 tablet 0 03/24/2020 at Unknown time  . metoprolol succinate (TOPROL-XL) 50 MG 24 hr tablet TAKE 1 TABLET BY MOUTH  DAILY WITH OR IMMEDIATELY  FOLLOWING A MEAL (Patient taking differently: Take 50 mg by mouth every morning. ) 90 tablet 0 03/24/2020 at 800  . omeprazole (PRILOSEC) 20 MG capsule TAKE 1 CAPSULE BY MOUTH  DAILY (Patient taking differently: Take 20 mg by mouth daily. ) 90 capsule 0 03/24/2020 at Unknown time  . tamsulosin (FLOMAX) 0.4 MG CAPS capsule TAKE 2 CAPSULES BY MOUTH  DAILY AFTER SUPPER (Patient taking differently: Take 0.8 mg by mouth daily after supper. ) 180 capsule 0 03/23/2020 at Unknown time    Assessment: Pharmacy consulted to dose heparin for this patient with atrial fibrillation.  No prior to admission anticoagulants listed.   No bleeding noted.   H&H WNL and Platelets slightly low at 137  Goal of Therapy:  Heparin  level 0.3-0.7 units/ml Monitor platelets by anticoagulation protocol: Yes   Plan:  Give heparin 5000 units bolus x 1 Start heparin infusion at 1400 units/hr Check anti-Xa level in 8 hours and daily while on heparin Continue to monitor H&H, platelets, and s/s bleeding   Royce Macadamia, PharmD, BCPS 03/26/2020,4:01 PM

## 2020-03-26 NOTE — Progress Notes (Signed)
PROGRESS NOTE    Vincent EaringJames D Collins  ZOX:[id number] DOB: Jun 16, 1941 DOA: 03/24/2020 PCP: Dettinger, Elige RadonJoshua A, MD  Outpatient Specialists:   Brief Narrative:  Patient is a 79 year old Caucasian male, obese, with past medical history significant for COPD, diabetes mellitus type 2, hypertension, hyperlipidemia, left renal hydronephrosis and diverticulitis.  Patient is currently being managed for acute pancreatitis and E. coli bacteremia/sepsis versus possible cholangitis.  Rising bilirubin is noted.  Lipase is improving.  Abdominal pain is improving.  Patient developed atrial fibrillation with rapid ventricular response earlier today.  Patient also reported feeling weak, with associated shortness of breath.  Patient will be transferred to progressive unit.  Heparin drip started.  Beta-blocker started.  Cardiology team consulted.  Meanwhile, cardiac enzymes, TSH and D-dimer ordered.  Will hold IV fluids.  IV Lasix 40 Mg x1 dose given.  Patient is sick.  Further management depend on hospital course.   Assessment & Plan:   Principal Problem:   Cholangitis Active Problems:   Essential hypertension   COPD (chronic obstructive pulmonary disease) (HCC)   Type 2 diabetes mellitus (HCC)  Acute pancreatitis: -Etiology unclear. -GI team is directing care. -Lipase is on downward trend. -Abdominal pain, though still present, is improving. -Repeat lipase in the morning. -GI to advise when to start oral intake.  E. coli bacteremia/sepsis versus possible cholangitis: -Follow final cultures -Continue antibiotics -Leukocytosis is improving  Atrial fibrillation with rapid ventricular response: -Likely adrenergic driven (from above problems) -See above documentation -Rate control and anticoagulation -Echocardiogram -Cardiology team. -Follow TSH, D-dimer, troponins. -Metoprolol. -Further management depend on hospital course.  Diabetes mellitus: -Continue to optimize.  Hypertension:  -Continue to  optimize. -Goal blood pressure is below 130/80 mmHg.  COPD: -Stable. -Continue supportive care.  Obesity: -Further management on outpatient basis. -Diet and exercise.  Guarded prognosis.  DVT prophylaxis: Heparin drip Code Status: Full code Family Communication: Wife and son Disposition Plan: This will depend on hospital course   Consultants:   GI.  Cardiology  Procedures:   None.  Antimicrobials:   IV ceftriaxone  And IV Flagyl   Subjective: Shortness of breath Weakness  Objective: Vitals:   03/26/20 0433 03/26/20 1318 03/26/20 1551 03/26/20 1727  BP: (!) 162/79 (!) 166/91  (!) 166/98  Pulse: (!) 101 92 (!) 110 94  Resp: 18 18  18   Temp: 98.6 F (37 C) 97.9 F (36.6 C)  98.4 F (36.9 C)  TempSrc: Oral Oral  Oral  SpO2: 94% 94%  96%  Weight:      Height:        Intake/Output Summary (Last 24 hours) at 03/26/2020 1735 Last data filed at 03/26/2020 1312 Gross per 24 hour  Intake 1543.12 ml  Output 1100 ml  Net 443.12 ml   Filed Weights   03/24/20 1822  Weight: 111.1 kg    Examination:  General exam: Appears calm and comfortable  Respiratory system: Clear to auscultation. Respiratory effort normal. Cardiovascular system: S1 & S2, irregularly irregular.  Gastrointestinal system: Abdomen is obese, soft with vague tenderness.  Her organs are difficult to assess.   Central nervous system: Alert and oriented.  Patient moves all extremities. Extremities: No leg edema  Data Reviewed: I have personally reviewed following labs and imaging studies  CBC: Recent Labs  Lab 03/24/20 1820 03/25/20 0420 03/26/20 0256  WBC 7.7 20.4* 16.7*  NEUTROABS 7.1 18.7* 14.7*  HGB 16.1 15.0 14.4  HCT 47.7 44.7 42.2  MCV 90.0 89.0 88.3  PLT 178 161 137*  Basic Metabolic Panel: Recent Labs  Lab 03/24/20 1820 03/25/20 0420 03/26/20 0256  NA 136 138 139  K 3.6 3.3* 3.8  CL 104 104 105  CO2 20* 22 21*  GLUCOSE 226* 178* 147*  BUN 21 19 18   CREATININE  1.07 1.49* 1.13  CALCIUM 8.3* 8.1* 7.8*  MG  --  1.2* 2.2  PHOS  --   --  2.5   GFR: Estimated Creatinine Clearance: 66.2 mL/min (by C-G formula based on SCr of 1.13 mg/dL). Liver Function Tests: Recent Labs  Lab 03/24/20 1820 03/25/20 0420 03/26/20 0256  AST 352* 266* 120*  ALT 255* 258* 184*  ALKPHOS 195* 145* 145*  BILITOT 4.2* 5.1* 6.5*  PROT 6.6 5.3* 5.0*  ALBUMIN 3.5 2.7* 2.6*  2.6*   Recent Labs  Lab 03/25/20 0140 03/26/20 0256  LIPASE 1,476* 571*   No results for input(s): AMMONIA in the last 168 hours. Coagulation Profile: Recent Labs  Lab 03/24/20 1820 03/25/20 0420  INR 1.0 1.1   Cardiac Enzymes: No results for input(s): CKTOTAL, CKMB, CKMBINDEX, TROPONINI in the last 168 hours. BNP (last 3 results) No results for input(s): PROBNP in the last 8760 hours. HbA1C: Recent Labs    03/25/20 0140  HGBA1C 6.9*   CBG: Recent Labs  Lab 03/26/20 0001 03/26/20 0430 03/26/20 0810 03/26/20 1158 03/26/20 1604  GLUCAP 141* 132* 129* 125* 116*   Lipid Profile: Recent Labs    03/25/20 0839  TRIG 76   Thyroid Function Tests: No results for input(s): TSH, T4TOTAL, FREET4, T3FREE, THYROIDAB in the last 72 hours. Anemia Panel: No results for input(s): VITAMINB12, FOLATE, FERRITIN, TIBC, IRON, RETICCTPCT in the last 72 hours. Urine analysis:    Component Value Date/Time   COLORURINE AMBER (A) 03/25/2020 0450   APPEARANCEUR CLEAR 03/25/2020 0450   LABSPEC 1.023 03/25/2020 0450   PHURINE 5.0 03/25/2020 0450   GLUCOSEU NEGATIVE 03/25/2020 0450   HGBUR NEGATIVE 03/25/2020 0450   BILIRUBINUR NEGATIVE 03/25/2020 0450   BILIRUBINUR small 08/07/2015 1659   KETONESUR NEGATIVE 03/25/2020 0450   PROTEINUR 30 (A) 03/25/2020 0450   UROBILINOGEN negative 08/07/2015 1659   UROBILINOGEN 0.2 09/20/2014 2300   NITRITE NEGATIVE 03/25/2020 0450   LEUKOCYTESUR NEGATIVE 03/25/2020 0450   Sepsis Labs: @LABRCNTIP (procalcitonin:4,lacticidven:4)  ) Recent Results  (from the past 240 hour(s))  Culture, blood (Routine x 2)     Status: None (Preliminary result)   Collection Time: 03/24/20  6:20 PM   Specimen: Left Antecubital; Blood  Result Value Ref Range Status   Specimen Description   Final    LEFT ANTECUBITAL Performed at [facility], [address], [place], [address] [place]    Special Requests   Final    BOTTLES DRAWN AEROBIC AND ANAEROBIC Blood Culture adequate volume Performed at [facility], [address] [place], [address] [place]    Culture  Setup Time   Final    GRAM NEGATIVE RODS IN BOTH AEROBIC AND ANAEROBIC BOTTLES Gram Stain Report Called to,Read Back By and Verified With: BAINE C. @ [place] @ 0852 ON [place] BY HENDERSON L CRITICAL RESULT CALLED TO, READ BACK BY AND VERIFIED WITH: D. PIERCE, PHARMD AT 1354 ON 03/25/20 BY C. JESSUP, MT. Performed at [facility], [address], [place], [address] [place]    Culture GRAM NEGATIVE RODS  Final   Report Status PENDING  Incomplete  Culture, blood (Routine x 2)     Status: Abnormal (Preliminary result)   Collection Time: 03/24/20  7:01  PM   Specimen: Left Antecubital; Blood  Result Value Ref Range Status   Specimen Description   Final    LEFT ANTECUBITAL Performed at [facility], [address], [place], [place] [address]    Special Requests   Final    BOTTLES DRAWN AEROBIC AND ANAEROBIC Blood Culture adequate volume Performed at [facility], [address] [place], [place] [address]    Culture  Setup Time   Final    GRAM NEGATIVE RODS IN BOTH AEROBIC AND ANAEROBIC BOTTLES Gram Stain Report Called to,Read Back By and Verified With: BAINE C. @ [place] @ 0852 ON [id number] BY HENDERSON L CRITICAL RESULT CALLED TO, READ BACK BY AND VERIFIED WITH: D. PIERCE, PHARMD AT 1354 ON 03/25/20 BY C. JESSUP, MT. Performed at [facility], [address] [place], [place] [address]    Culture ESCHERICHIA COLI (A)  Final   Report Status PENDING  Incomplete  Blood Culture ID  Panel (Reflexed)     Status: Abnormal   Collection Time: 03/24/20  7:01 PM  Result Value Ref Range Status   Enterococcus species NOT DETECTED NOT DETECTED Final   Listeria monocytogenes NOT DETECTED NOT DETECTED Final   Staphylococcus species NOT DETECTED NOT DETECTED Final   Staphylococcus aureus (BCID) NOT DETECTED NOT DETECTED Final   Streptococcus species NOT DETECTED NOT DETECTED Final   Streptococcus agalactiae NOT DETECTED NOT DETECTED Final   Streptococcus pneumoniae NOT DETECTED NOT DETECTED Final   Streptococcus pyogenes NOT DETECTED NOT DETECTED Final   Acinetobacter baumannii NOT DETECTED NOT DETECTED Final   Enterobacteriaceae species DETECTED (A) NOT DETECTED Final    Comment: Enterobacteriaceae represent a large family of gram-negative bacteria, not a single organism. CRITICAL RESULT CALLED TO, READ BACK BY AND VERIFIED WITH: D. PIERCE, PHARMD AT 1354 ON 03/25/20 BY C. JESSUP, MT.    Enterobacter cloacae complex NOT DETECTED NOT DETECTED Final   Escherichia coli DETECTED (A) NOT DETECTED Final    Comment: CRITICAL RESULT CALLED TO, READ BACK BY AND VERIFIED WITH: D. PIERCE, PHARMD AT 1354 ON 03/25/20 BY C. JESSUP, MT.    Klebsiella oxytoca NOT DETECTED NOT DETECTED Final   Klebsiella pneumoniae NOT DETECTED NOT DETECTED Final   Proteus species NOT DETECTED NOT DETECTED Final   Serratia marcescens NOT DETECTED NOT DETECTED Final   Carbapenem resistance NOT DETECTED NOT DETECTED Final   Haemophilus influenzae NOT DETECTED NOT DETECTED Final   Neisseria meningitidis NOT DETECTED NOT DETECTED Final   Pseudomonas aeruginosa NOT DETECTED NOT DETECTED Final   Candida albicans NOT DETECTED NOT DETECTED Final   Candida glabrata NOT DETECTED NOT DETECTED Final   Candida krusei NOT DETECTED NOT DETECTED Final   Candida parapsilosis NOT DETECTED NOT DETECTED Final   Candida tropicalis NOT DETECTED NOT DETECTED Final    Comment: Performed at [facility], [address], [place], [place] [address]  Respiratory Panel by RT PCR (Flu A&B, Covid) - Nasopharyngeal Swab     Status: None   Collection Time: 03/24/20  7:10 PM   Specimen: Nasopharyngeal Swab  Result Value Ref Range Status   SARS Coronavirus 2 by RT PCR NEGATIVE NEGATIVE Final    Comment: (NOTE) SARS-CoV-2 target nucleic acids are NOT DETECTED. The SARS-CoV-2 RNA is generally detectable in upper respiratoy specimens during the acute phase of infection. The lowest concentration of SARS-CoV-2 viral copies this assay can detect is 131 copies/mL. A negative result does not preclude SARS-Cov-2 infection and should not be used  as the sole basis for treatment or other patient management decisions. A negative result may occur with  improper specimen collection/handling, submission of specimen other than nasopharyngeal swab, presence of viral mutation(s) within the areas targeted by this assay, and inadequate number of viral copies (<131 copies/mL). A negative result must be combined with clinical observations, patient history, and epidemiological information. The expected result is Negative. Fact Sheet for Patients:  https://www.moore.com/ Fact Sheet for Healthcare Providers:  https://www.young.biz/ This test is not yet ap proved or cleared by the [place] FDA and  has been authorized for detection and/or diagnosis of SARS-CoV-2 by FDA under an Emergency Use Authorization (EUA). This EUA will remain  in effect (meaning this test can be used) for the duration of the COVID-19 declaration under Section 564(b)(1) of the Act, 21 U.S.C. section 360bbb-3(b)(1), unless the authorization is terminated or revoked sooner.    Influenza A by PCR NEGATIVE NEGATIVE Final   Influenza B by PCR NEGATIVE NEGATIVE Final    Comment: (NOTE) The Xpert Xpress SARS-CoV-2/FLU/RSV assay is intended as an aid in  the diagnosis of influenza from Nasopharyngeal swab specimens and   should not be used as a sole basis for treatment. Nasal washings and  aspirates are unacceptable for Xpert Xpress SARS-CoV-2/FLU/RSV  testing. Fact Sheet for Patients: https://www.moore.com/ Fact Sheet for Healthcare Providers: https://www.young.biz/ This test is not yet approved or cleared by the [place] FDA and  has been authorized for detection and/or diagnosis of SARS-CoV-2 by  FDA under an Emergency Use Authorization (EUA). This EUA will remain  in effect (meaning this test can be used) for the duration of the  Covid-19 declaration under Section 564(b)(1) of the Act, 21  U.S.C. section 360bbb-3(b)(1), unless the authorization is  terminated or revoked. Performed at [facility], [address] [place], [place] [address]          Radiology Studies: DG CHEST PORT 1 VIEW  Result Date: 03/26/2020 CLINICAL DATA:  Shortness of breath with cough EXAM: PORTABLE CHEST 1 VIEW COMPARISON:  03/24/2020 FINDINGS: Low volume chest with prominence of the cardiomediastinal silhouette. Mild atelectasis or scar like appearance at the bases. There is no edema, consolidation, effusion, or pneumothorax. No acute osseous finding. IMPRESSION: Minimal scarring or atelectasis at the bases. Electronically Signed   By: Marnee Spring M.D.   On: 03/26/2020 10:09   DG Chest Port 1 View  Result Date: 03/24/2020 CLINICAL DATA:  Sepsis. EXAM: PORTABLE CHEST 1 VIEW COMPARISON:  January 04, 2020 FINDINGS: Heart size is enlarged. There is no pneumothorax or pleural effusion. There are airspace opacities at the lung bases bilaterally favored to represent areas of scarring atelectasis. There is no acute osseous abnormality. IMPRESSION: 1. Cardiomegaly. 2. Bibasilar airspace opacities favored to represent scarring atelectasis. Electronically Signed   By: Katherine Mantle M.D.   On: 03/24/2020 18:50   MR ABDOMEN MRCP W WO CONTAST  Result Date: 03/25/2020 CLINICAL DATA:   Jaundice. Abdominal pain, nausea and vomiting. Chills. COPD. EXAM: MRI ABDOMEN WITHOUT AND WITH CONTRAST (INCLUDING MRCP) TECHNIQUE: Multiplanar multisequence MR imaging of the abdomen was performed both before and after the administration of intravenous contrast. Heavily T2-weighted images of the biliary and pancreatic ducts were obtained, and three-dimensional MRCP images were rendered by post processing. CONTRAST:  6mL GADAVIST GADOBUTROL 1 MMOL/ML IV SOLN COMPARISON:  Abdominal sonogram from earlier today. 09/11/2014 CT abdomen/pelvis. FINDINGS: Lower chest: No acute abnormality at the lung bases. Hepatobiliary: Normal liver size and configuration. Mild diffuse hepatic steatosis.  Scattered simple subcentimeter left liver lobe cysts. No suspicious liver masses. Moderate diffuse gallbladder wall thickening. No gallstones. Gallbladder is nondistended. No significant pericholecystic fluid. No biliary ductal dilatation. Common bile duct diameter 4 mm. No biliary filling defects to suggest choledocholithiasis. No biliary strictures, masses or beading. Pancreas: Diffuse pancreatic parenchymal and peripancreatic edema compatible with acute pancreatitis. No pancreatic duct dilation. No pancreas divisum. Preserved pancreatic parenchymal enhancement. Tiny nonenhancing 0.5 cm pancreatic tail cystic lesion compatible with a pseudocyst (series 18/image 11). No additional pancreatic lesions. No measurable peripancreatic fluid collections. Spleen: Normal size. No mass. Adrenals/Urinary Tract: Normal adrenals. No hydronephrosis. Exophytic minimally complex renal cysts in the upper left kidney measuring 1.7 cm medially (series 5/image 24) and 2.1 cm posteriorly (series 5/image 26), both demonstrating a tiny amount of layering hemorrhagic/proteinaceous material without enhancement, compatible with Bosniak category 2 hemorrhagic/proteinaceous renal cysts. Additional simple renal cysts scattered in both kidneys, largest 2.5 cm in  the interpolar right kidney. No suspicious renal masses. Stomach/Bowel: Normal non-distended stomach. Visualized small and large bowel is normal caliber, with no bowel wall thickening. Colonic diverticulosis. Vascular/Lymphatic: Atherosclerotic nonaneurysmal abdominal aorta. Patent portal, splenic, hepatic and renal veins. No pathologically enlarged lymph nodes in the abdomen. Other: No abdominal ascites or focal fluid collection. Musculoskeletal: No aggressive appearing focal osseous lesions. IMPRESSION: 1. Acute non-necrotizing pancreatitis. No cholelithiasis or choledocholithiasis. No biliary or pancreatic duct dilation. Tiny 0.5 cm pancreatic tail pseudocyst. No measurable peripancreatic fluid collections. 2. Moderate diffuse gallbladder wall thickening, nonspecific, probably reactive or due to noninflammatory edema. 3. Mild diffuse hepatic steatosis. 4. Colonic diverticulosis. 5. Bosniak category 1 and category 2 renal cysts. No suspicious renal masses. 6.  Aortic Atherosclerosis (ICD10-I70.0). Electronically Signed   By: Ilona Sorrel M.D.   On: 03/25/2020 12:21   US Abdomen Limited RUQ  Result Date: 03/25/2020 CLINICAL DATA:  Right upper quadrant pain EXAM: ULTRASOUND ABDOMEN LIMITED RIGHT UPPER QUADRANT COMPARISON:  None. FINDINGS: Gallbladder: Diffusely thickened gallbladder wall measuring up to 1 cm. No sonographic Murphy sign. Small amount of pericholecystic fluid is seen. Common bile duct: Diameter: 4.4 mm Liver: No focal lesion identified. Within normal limits in parenchymal echogenicity. Portal vein is patent on color Doppler imaging with normal direction of blood flow towards the liver. Other: None. IMPRESSION: Diffusely thickened gallbladder wall with a small amount of pericholecystic fluid. This is nonspecific, could be due to acalculous cholecystitis Electronically Signed   By: Prudencio Pair M.D.   On: 03/25/2020 00:13        Scheduled Meds: . fluticasone  2 spray Each Nare Daily  .  heparin  5,000 Units Intravenous Once  . insulin aspart  0-9 Units Subcutaneous Q4H  . metoprolol tartrate  25 mg Oral TID  . ondansetron  4 mg Oral Once  . sodium chloride flush  3 mL Intravenous Q12H  . tamsulosin  0.8 mg Oral QPC supper   Continuous Infusions: . cefTRIAXone (ROCEPHIN)  IV 2 g (03/26/20 1330)  . heparin    . metronidazole 500 mg (03/26/20 1332)     LOS: 1 day    Time spent: 35 minutes.    Dana Allan, MD  [facility] Pager #: [phone] 7PM-7AM contact night coverage as above

## 2020-03-26 NOTE — Progress Notes (Signed)
[facility] Progress Note  Subjective: The patient's abdomen still feels sore today but states it has not really bad pain.  No vomiting.  Lipase has improved.  LFTs are still elevated.  MRCP did not show any CBD stones.  He does not drink alcohol.  Etiology of pancreatitis still unclear.  Objective: Vital signs in last 24 hours: Temp:  [98.3 F (36.8 C)-98.6 F (37 C)] 98.6 F (37 C) (03/29 0433) Pulse Rate:  [72-101] 101 (03/29 0433) Resp:  [18-20] 18 (03/29 0433) BP: (103-162)/(69-91) 162/79 (03/29 0433) SpO2:  [93 %-95 %] 94 % (03/29 0433) Weight change:    PE:  No distress  Heart regular rhythm  Lungs clear  Abdomen soft with some tenderness in the upper abdomen  Lab Results: Results for orders placed or performed during the hospital encounter of 03/24/20 (from the past 24 hour(s))  CBG monitoring, ED     Status: Abnormal   Collection Time: 03/25/20 11:34 AM  Result Value Ref Range   Glucose-Capillary 117 (H) 70 - 99 mg/dL   Comment 1 Document in Chart   Lactic acid, plasma     Status: None   Collection Time: 03/25/20 11:44 AM  Result Value Ref Range   Lactic Acid, Venous 1.6 0.5 - 1.9 mmol/L  Glucose, capillary     Status: Abnormal   Collection Time: 03/25/20  3:41 PM  Result Value Ref Range   Glucose-Capillary 100 (H) 70 - 99 mg/dL  Glucose, capillary     Status: Abnormal   Collection Time: 03/25/20  8:04 PM  Result Value Ref Range   Glucose-Capillary 127 (H) 70 - 99 mg/dL  Glucose, capillary     Status: Abnormal   Collection Time: 03/26/20 12:01 AM  Result Value Ref Range   Glucose-Capillary 141 (H) 70 - 99 mg/dL  Renal function panel     Status: Abnormal   Collection Time: 03/26/20  2:56 AM  Result Value Ref Range   Sodium 139 135 - 145 mmol/L   Potassium 3.8 3.5 - 5.1 mmol/L   Chloride 105 98 - 111 mmol/L   CO2 21 (L) 22 - 32 mmol/L   Glucose, Bld 147 (H) 70 - 99 mg/dL   BUN 18 8 - 23 mg/dL   Creatinine, Ser 1.95 0.61 - 1.24 mg/dL   Calcium 7.8 (L) 8.9 - 10.3 mg/dL   Phosphorus 2.5 2.5 - 4.6 mg/dL   Albumin 2.6 (L) 3.5 - 5.0 g/dL   GFR calc non Af Amer >60 >60 mL/min   GFR calc Af Amer >60 >60 mL/min   Anion gap 13 5 - 15  CBC with Differential/Platelet     Status: Abnormal   Collection Time: 03/26/20  2:56 AM  Result Value Ref Range   WBC 16.7 (H) 4.0 - 10.5 K/uL   RBC 4.78 4.22 - 5.81 MIL/uL   Hemoglobin 14.4 13.0 - 17.0 g/dL   HCT 09.3 26.7 - 12.4 %   MCV 88.3 80.0 - 100.0 fL   MCH 30.1 26.0 - 34.0 pg   MCHC 34.1 30.0 - 36.0 g/dL   RDW 58.0 99.8 - 33.8 %   Platelets 137 (L) 150 - 400 K/uL   nRBC 0.0 0.0 - 0.2 %   Neutrophils Relative % 88 %   Neutro Abs 14.7 (H) 1.7 - 7.7 K/uL   Lymphocytes Relative 4 %   Lymphs Abs 0.6 (L) 0.7 - 4.0 K/uL   Monocytes Relative 6 %   Monocytes Absolute 1.0 0.1 - 1.0  K/uL   Eosinophils Relative 0 %   Eosinophils Absolute 0.0 0.0 - 0.5 K/uL   Basophils Relative 0 %   Basophils Absolute 0.0 0.0 - 0.1 K/uL   Immature Granulocytes 2 %   Abs Immature Granulocytes 0.36 (H) 0.00 - 0.07 K/uL  Magnesium     Status: None   Collection Time: 03/26/20  2:56 AM  Result Value Ref Range   Magnesium 2.2 1.7 - 2.4 mg/dL  Lipase, blood     Status: Abnormal   Collection Time: 03/26/20  2:56 AM  Result Value Ref Range   Lipase 571 (H) 11 - 51 U/L  Hepatic function panel     Status: Abnormal   Collection Time: 03/26/20  2:56 AM  Result Value Ref Range   Total Protein 5.0 (L) 6.5 - 8.1 g/dL   Albumin 2.6 (L) 3.5 - 5.0 g/dL   AST 120 (H) 15 - 41 U/L   ALT 184 (H) 0 - 44 U/L   Alkaline Phosphatase 145 (H) 38 - 126 U/L   Total Bilirubin 6.5 (H) 0.3 - 1.2 mg/dL   Bilirubin, Direct 4.1 (H) 0.0 - 0.2 mg/dL   Indirect Bilirubin 2.4 (H) 0.3 - 0.9 mg/dL  Glucose, capillary     Status: Abnormal   Collection Time: 03/26/20  4:30 AM  Result Value Ref Range   Glucose-Capillary 132 (H) 70 - 99 mg/dL  Glucose, capillary     Status: Abnormal   Collection Time: 03/26/20  8:10 AM  Result Value  Ref Range   Glucose-Capillary 129 (H) 70 - 99 mg/dL    Studies/Results: DG CHEST PORT 1 VIEW  Result Date: 03/26/2020 CLINICAL DATA:  Shortness of breath with cough EXAM: PORTABLE CHEST 1 VIEW COMPARISON:  03/24/2020 FINDINGS: Low volume chest with prominence of the cardiomediastinal silhouette. Mild atelectasis or scar like appearance at the bases. There is no edema, consolidation, effusion, or pneumothorax. No acute osseous finding. IMPRESSION: Minimal scarring or atelectasis at the bases. Electronically Signed   By: Monte Fantasia M.D.   On: 03/26/2020 10:09      Assessment: Acute pancreatitis uncertain etiology  Plan:   Continue supportive care and management for pancreatitis follow clinically    Cassell Clement 03/26/2020, 10:27 AM  Pager: [phone] If no answer or after 5 PM call [phone]

## 2020-03-26 NOTE — Consult Note (Signed)
Cardiology Consultation:   Patient ID: Vincent Collins MRN: [id number]; DOB: March 09, 1941  Admit date: 03/24/2020 Date of Consult: 03/26/2020  Primary Care Provider: Dettinger, Elige Radon, MD Primary Cardiologist: New to [facility] Primary Electrophysiologist:  None    Patient Profile:   Vincent Collins is a 79 y.o. male with a hx of f COPD, hypertension, hyperlipidemia, history of kidney stone and DM2 who is being seen today for the evaluation of PAF with RVR in the setting of acute pancreatitis at the request of Dr. Dartha Lodge.  History of Present Illness:   Vincent Collins is a 79 year old male with past medical history of COPD, hypertension, hyperlipidemia, history of kidney stone and DM2.  He has no prior cardiac history nor has he ever seen by a cardiologist.  His father died at age 66 of unknown illness.  His mother lived until 69 years old and does not have any medical issue.  Patient is a former smoker however quit in 1968.  He does not drink any alcohol either.  He has been in his usual state of health until recently when he started having fatigue, nausea, vomiting and abdominal discomfort.  He initially sold medical attention at [facility] ED in [place] on 03/20/2020.  CT angiogram of the chest abdomen and pelvis performed at [facility] showed no PE, pleural effusion or acute infiltrate in his lung and abdomen.    Patient was initially discharged home and his symptom gradually improved.  However later symptoms came back and he sought medical attention at any time hospital on 3/27.  Liver function test was significantly elevated.  He was febrile on arrival with T-max 38.1C and tachycardic with heart rate in the 120s.  Patient was treated with Zosyn and transferred to [facility].  Covid test was negative.  Ultrasound of right upper quadrant demonstrated diffusely thickened gallbladder wall with small amount of pericholecystic fluid.  Initially, there was some concern of cholecystitis,  however lipase was elevated at 1476.  Lactic acid 3.3.  MRCP suggest acute pancreatitis.  Patient was seen by both general surgery and also GI service who recommended conservative management.  During the hospitalization, he remained in sinus tachycardia, however in the afternoon of 03/26/2020, he went into atrial fibrillation with RVR.  Cardiology has been consulted for management of atrial fibrillation in the setting of acute pancreatitis.   Past Medical History:  Diagnosis Date  . Broken leg and ribs, right, closed, initial encounter   . COPD (chronic obstructive pulmonary disease) (HCC)   . Diabetes mellitus without complication (HCC)   . Diverticulitis   . GERD (gastroesophageal reflux disease)   . Hydronephrosis of left kidney   . Hyperlipidemia   . Hypertension   . Left ureteral stone    obstructing  . Renal calculus or stone 2015    Past Surgical History:  Procedure Laterality Date  . 2d echo  04/04/09  . CYSTOSCOPY WITH RETROGRADE PYELOGRAM, URETEROSCOPY AND STENT PLACEMENT Left 10/11/2014   Procedure: CYSTOSCOPY WITH RETROGRADE PYELOGRAM, DIAGNOSTIC URETEROSCOPY AND STENT PLACEMENT;  Surgeon: [name], MD;  Location: [facility];  Service: Urology;  Laterality: Left;  . LUMBAR DISC SURGERY    . SPINE SURGERY       Home Medications:  Prior to Admission medications   Medication Sig Start Date End Date Taking? Authorizing Provider  albuterol (PROAIR HFA) 108 (90 Base) MCG/ACT inhaler USE 2 PUFFS EVERY 6 HOURS  AS NEEDED FOR WHEEZING Patient taking differently: Inhale 1-2 puffs  into the lungs every 6 (six) hours as needed for wheezing.  05/25/18  Yes Dettinger, Fransisca Kaufmann, MD  albuterol (PROVENTIL) (2.5 MG/3ML) 0.083% nebulizer solution Take 3 mLs (2.5 mg total) by nebulization 4 (four) times daily as needed for wheezing or shortness of breath. 10/26/19 03/24/20 Yes Dettinger, Fransisca Kaufmann, MD  aspirin 81 MG EC tablet Take 81 mg by mouth every evening.    Yes  [provider]  atorvastatin (LIPITOR) 40 MG tablet TAKE 1 TABLET BY MOUTH  DAILY Patient taking differently: Take 40 mg by mouth every evening.  03/05/20  Yes Dettinger, Fransisca Kaufmann, MD  Cholecalciferol (VITAMIN D) 1000 UNITS capsule Take 1,000 Units by mouth in the morning.    Yes [provider]  Coenzyme Q10-Fish Oil-Vit E (CO-Q 10 OMEGA-3 FISH OIL) CAPS Take 1 capsule by mouth in the morning. 100mg  Capsule Qunol   Yes [provider]  fluticasone (FLONASE) 50 MCG/ACT nasal spray USE 1 SPRAY INTO BOTH  NOSTRILS 2 TIMES DAILY AS  NEEDED FOR ALLERGIES OR  RHINITIS Patient taking differently: Place 1 spray into both nostrils 2 (two) times daily as needed for allergies. FOR ALLERGIES OR  RHINITIS. 03/05/20  Yes Dettinger, Fransisca Kaufmann, MD  lisinopril (ZESTRIL) 20 MG tablet TAKE 1 TABLET BY MOUTH  DAILY Patient taking differently: Take 20 mg by mouth every morning.  03/05/20  Yes Dettinger, Fransisca Kaufmann, MD  metFORMIN (GLUCOPHAGE-XR) 500 MG 24 hr tablet TAKE 1 TABLET BY MOUTH TWO  TIMES DAILY Patient taking differently: Take 500 mg by mouth in the morning and at bedtime.  03/05/20  Yes Dettinger, Fransisca Kaufmann, MD  metoprolol succinate (TOPROL-XL) 50 MG 24 hr tablet TAKE 1 TABLET BY MOUTH  DAILY WITH OR IMMEDIATELY  FOLLOWING A MEAL Patient taking differently: Take 50 mg by mouth every morning.  03/05/20  Yes Dettinger, Fransisca Kaufmann, MD  omeprazole (PRILOSEC) 20 MG capsule TAKE 1 CAPSULE BY MOUTH  DAILY Patient taking differently: Take 20 mg by mouth daily.  03/05/20  Yes Dettinger, Fransisca Kaufmann, MD  tamsulosin (FLOMAX) 0.4 MG CAPS capsule TAKE 2 CAPSULES BY MOUTH  DAILY AFTER SUPPER Patient taking differently: Take 0.8 mg by mouth daily after supper.  03/05/20  Yes Dettinger, Fransisca Kaufmann, MD    Inpatient Medications: Scheduled Meds: . fluticasone  2 spray Each Nare Daily  . heparin  5,000 Units Intravenous Once  . insulin aspart  0-9 Units Subcutaneous Q4H  . metoprolol tartrate  25 mg Oral TID  .  ondansetron  4 mg Oral Once  . sodium chloride flush  3 mL Intravenous Q12H  . tamsulosin  0.8 mg Oral QPC supper   Continuous Infusions: . cefTRIAXone (ROCEPHIN)  IV 2 g (03/26/20 1330)  . heparin    . metronidazole 500 mg (03/26/20 1332)   PRN Meds: acetaminophen **OR** acetaminophen, fentaNYL (SUBLIMAZE) injection, fluticasone, guaiFENesin, levalbuterol, ondansetron (ZOFRAN) IV  Allergies:    Allergies  Allergen Reactions  . Diphenhydramine Hcl Other (See Comments)    Can tolerate    Social History:   Social History   Socioeconomic History  . Marital status: Married    Spouse name: Not on file  . Number of children: Not on file  . Years of education: Not on file  . Highest education level: Not on file  Occupational History  . Not on file  Tobacco Use  . Smoking status: Former Smoker    Types: Cigarettes    Quit date: 06/18/1967    Years since  quitting: 52.8  . Smokeless tobacco: Former Neurosurgeon    Types: Chew  Substance and Sexual Activity  . Alcohol use: No  . Drug use: No  . Sexual activity: Not Currently  Other Topics Concern  . Not on file  Social History Narrative  . Not on file   Social Determinants of Health   Financial Resource Strain:   . Difficulty of Paying Living Expenses:   Food Insecurity:   . Worried About Programme researcher, broadcasting/film/video in the Last Year:   . Barista in the Last Year:   Transportation Needs:   . Freight forwarder (Medical):   Marland Kitchen Lack of Transportation (Non-Medical):   Physical Activity:   . Days of Exercise per Week:   . Minutes of Exercise per Session:   Stress:   . Feeling of Stress :   Social Connections:   . Frequency of Communication with Friends and Family:   . Frequency of Social Gatherings with Friends and Family:   . Attends Religious Services:   . Active Member of Clubs or Organizations:   . Attends Banker Meetings:   Marland Kitchen Marital Status:   Intimate Partner Violence:   . Fear of Current or  Ex-Partner:   . Emotionally Abused:   Marland Kitchen Physically Abused:   . Sexually Abused:     Family History:    Family History  Problem Relation Age of Onset  . Early death Father        MI age 91  . Heart attack Father   . COPD Brother   . Cancer Sister      ROS:  Please see the history of present illness.   All other ROS reviewed and negative.     Physical Exam/Data:   Vitals:   03/26/20 0027 03/26/20 0433 03/26/20 1318 03/26/20 1551  BP: 138/70 (!) 162/79 (!) 166/91   Pulse: 99 (!) 101 92 (!) 110  Resp: 18 18 18    Temp: 98.6 F (37 C) 98.6 F (37 C) 97.9 F (36.6 C)   TempSrc: Oral Oral Oral   SpO2: 94% 94% 94%   Weight:      Height:        Intake/Output Summary (Last 24 hours) at 03/26/2020 1704 Last data filed at 03/26/2020 1312 Gross per 24 hour  Intake 1543.12 ml  Output 1100 ml  Net 443.12 ml   Last 3 Weights 03/24/2020 01/04/2020 12/29/2019  Weight (lbs) 245 lb 244 lb 243 lb  Weight (kg) 111.131 kg 110.678 kg 110.224 kg     Body mass index is 37.25 kg/m.  General:  Well nourished, well developed, in no acute distress HEENT: normal Lymph: no adenopathy Neck: no JVD Endocrine:  No thryomegaly Vascular: No carotid bruits; FA pulses 2+ bilaterally without bruits  Cardiac: Tachycardic; no murmur  Lungs:  clear to auscultation bilaterally, no wheezing, rhonchi or rales  Abd: soft, nontender, no hepatomegaly  Ext: no edema Musculoskeletal:  No deformities, BUE and BLE strength normal and equal Skin: warm and dry  Neuro:  CNs 2-12 intact, no focal abnormalities noted Psych:  Normal affect   EKG:  The EKG was personally reviewed and demonstrates: Atrial fibrillation with RVR on 03/26/2020 Telemetry:  Telemetry was personally reviewed and demonstrates: Atrial fibrillation with RVR  Relevant CV Studies:  CTA chest abdomen and pelvis 03/20/2020 The thoracic inlet is grossly unremarkable.  There is no axillary adenopathy.  The central airways are  patent.  Evaluation of the pulmonary  arteries demonstrate no vascular filling defect to suggest pulmonary emboli.  There is no acute infiltrate. There is no pleural effusion.  The heart is within normal limits.  There is no evidence of aortic aneurysm or aortic dissection..  The celiac axis and its proximal branches are unremarkable. The superior mesenteric artery is unremarkable.  The bilateral renal arteries are patent.  The inferior mesenteric artery is identified without abnormality.  The bilateral common iliac internal and external iliac arteries are grossly unremarkable.  The liver demonstrates no gross of the mildly.  There is no radiopaque gallbladder calculi.  The spleen and the pancreas are within normal limits.  There are no adrenal nodules.  There is no evidence of renal calculi or hydronephrosis. There are bilateral renal cysts.  There is no evidence of small bowel obstruction.  The appendix is unremarkable.  The colon is poorly evaluated due to the lack of oral contrast and poor distention. There are multiple diverticuli throughout the colon without colonic wall thickening or pericolonic stranding.  There is a fat-containing ventral abdominal hernia.  Urinary bladder is unremarkable.  The osseous structures demonstrate chronic appearing fractures of the anterior aspect of the right seventh and eighth ribs.  Laboratory Data:  High Sensitivity Troponin:   Recent Labs  Lab 03/26/20 1517  TROPONINIHS 34*     Chemistry Recent Labs  Lab 03/24/20 1820 03/25/20 0420 03/26/20 0256  NA 136 138 139  K 3.6 3.3* 3.8  CL 104 104 105  CO2 20* 22 21*  GLUCOSE 226* 178* 147*  BUN 21 19 18   CREATININE 1.07 1.49* 1.13  CALCIUM 8.3* 8.1* 7.8*  GFRNONAA >60 45* >60  GFRAA >60 52* >60  ANIONGAP 12 12 13     Recent Labs  Lab 03/24/20 1820 03/25/20 0420 03/26/20 0256  PROT 6.6 5.3* 5.0*  ALBUMIN 3.5 2.7* 2.6*  2.6*  AST 352* 266* 120*  ALT 255* 258*  184*  ALKPHOS 195* 145* 145*  BILITOT 4.2* 5.1* 6.5*   Hematology Recent Labs  Lab 03/24/20 1820 03/25/20 0420 03/26/20 0256  WBC 7.7 20.4* 16.7*  RBC 5.30 5.02 4.78  HGB 16.1 15.0 14.4  HCT 47.7 44.7 42.2  MCV 90.0 89.0 88.3  MCH 30.4 29.9 30.1  MCHC 33.8 33.6 34.1  RDW 12.8 13.2 13.3  PLT 178 161 137*   BNP Recent Labs  Lab 03/26/20 0256  BNP 230.0*    DDimer  Recent Labs  Lab 03/26/20 1517  DDIMER 6.11*     Radiology/Studies:  DG CHEST PORT 1 VIEW  Result Date: 03/26/2020 CLINICAL DATA:  Shortness of breath with cough EXAM: PORTABLE CHEST 1 VIEW COMPARISON:  03/24/2020 FINDINGS: Low volume chest with prominence of the cardiomediastinal silhouette. Mild atelectasis or scar like appearance at the bases. There is no edema, consolidation, effusion, or pneumothorax. No acute osseous finding. IMPRESSION: Minimal scarring or atelectasis at the bases. Electronically Signed   By: 03/28/2020 M.D.   On: 03/26/2020 10:09   DG Chest Port 1 View  Result Date: 03/24/2020 CLINICAL DATA:  Sepsis. EXAM: PORTABLE CHEST 1 VIEW COMPARISON:  January 04, 2020 FINDINGS: Heart size is enlarged. There is no pneumothorax or pleural effusion. There are airspace opacities at the lung bases bilaterally favored to represent areas of scarring atelectasis. There is no acute osseous abnormality. IMPRESSION: 1. Cardiomegaly. 2. Bibasilar airspace opacities favored to represent scarring atelectasis. Electronically Signed   By: 03/26/2020 M.D.   On: 03/24/2020 18:50   MR ABDOMEN MRCP W  WO CONTAST  Result Date: 03/25/2020 CLINICAL DATA:  Jaundice. Abdominal pain, nausea and vomiting. Chills. COPD. EXAM: MRI ABDOMEN WITHOUT AND WITH CONTRAST (INCLUDING MRCP) TECHNIQUE: Multiplanar multisequence MR imaging of the abdomen was performed both before and after the administration of intravenous contrast. Heavily T2-weighted images of the biliary and pancreatic ducts were obtained, and  three-dimensional MRCP images were rendered by post processing. CONTRAST:  10mL GADAVIST GADOBUTROL 1 MMOL/ML IV SOLN COMPARISON:  Abdominal sonogram from earlier today. 09/11/2014 CT abdomen/pelvis. FINDINGS: Lower chest: No acute abnormality at the lung bases. Hepatobiliary: Normal liver size and configuration. Mild diffuse hepatic steatosis. Scattered simple subcentimeter left liver lobe cysts. No suspicious liver masses. Moderate diffuse gallbladder wall thickening. No gallstones. Gallbladder is nondistended. No significant pericholecystic fluid. No biliary ductal dilatation. Common bile duct diameter 4 mm. No biliary filling defects to suggest choledocholithiasis. No biliary strictures, masses or beading. Pancreas: Diffuse pancreatic parenchymal and peripancreatic edema compatible with acute pancreatitis. No pancreatic duct dilation. No pancreas divisum. Preserved pancreatic parenchymal enhancement. Tiny nonenhancing 0.5 cm pancreatic tail cystic lesion compatible with a pseudocyst (series 18/image 11). No additional pancreatic lesions. No measurable peripancreatic fluid collections. Spleen: Normal size. No mass. Adrenals/Urinary Tract: Normal adrenals. No hydronephrosis. Exophytic minimally complex renal cysts in the upper left kidney measuring 1.7 cm medially (series 5/image 24) and 2.1 cm posteriorly (series 5/image 26), both demonstrating a tiny amount of layering hemorrhagic/proteinaceous material without enhancement, compatible with Bosniak category 2 hemorrhagic/proteinaceous renal cysts. Additional simple renal cysts scattered in both kidneys, largest 2.5 cm in the interpolar right kidney. No suspicious renal masses. Stomach/Bowel: Normal non-distended stomach. Visualized small and large bowel is normal caliber, with no bowel wall thickening. Colonic diverticulosis. Vascular/Lymphatic: Atherosclerotic nonaneurysmal abdominal aorta. Patent portal, splenic, hepatic and renal veins. No pathologically  enlarged lymph nodes in the abdomen. Other: No abdominal ascites or focal fluid collection. Musculoskeletal: No aggressive appearing focal osseous lesions. IMPRESSION: 1. Acute non-necrotizing pancreatitis. No cholelithiasis or choledocholithiasis. No biliary or pancreatic duct dilation. Tiny 0.5 cm pancreatic tail pseudocyst. No measurable peripancreatic fluid collections. 2. Moderate diffuse gallbladder wall thickening, nonspecific, probably reactive or due to noninflammatory edema. 3. Mild diffuse hepatic steatosis. 4. Colonic diverticulosis. 5. Bosniak category 1 and category 2 renal cysts. No suspicious renal masses. 6.  Aortic Atherosclerosis (ICD10-I70.0). Electronically Signed   By: Delbert PhenixJason A Poff M.D.   On: 03/25/2020 12:21   US Abdomen Limited RUQ  Result Date: 03/25/2020 CLINICAL DATA:  Right upper quadrant pain EXAM: ULTRASOUND ABDOMEN LIMITED RIGHT UPPER QUADRANT COMPARISON:  None. FINDINGS: Gallbladder: Diffusely thickened gallbladder wall measuring up to 1 cm. No sonographic Murphy sign. Small amount of pericholecystic fluid is seen. Common bile duct: Diameter: 4.4 mm Liver: No focal lesion identified. Within normal limits in parenchymal echogenicity. Portal vein is patent on color Doppler imaging with normal direction of blood flow towards the liver. Other: None. IMPRESSION: Diffusely thickened gallbladder wall with a small amount of pericholecystic fluid. This is nonspecific, could be due to acalculous cholecystitis Electronically Signed   By: Jonna ClarkBindu  Avutu M.D.   On: 03/25/2020 00:13     Assessment and Plan:   1. Paroxysmal atrial fibrillation: Occurred in the afternoon in the setting of acute pancreatitis.  Prior to onset of atrial fibrillation, his heart rate was in the 120s range.  - CHA2DS2-Vasc score of 4 (age, HTN, DM II)  - Continue IV heparin. Will focus on rate control therapy for the time being.  Will discuss with MD regarding IV amiodarone, there  has been some association  between amiodarone and increased risk of pancreatitis.  Likely will pursue rate control therapy at this time.  -Consider increasing metoprolol further to 50 mg twice daily.  - obtain Echo once achieve adequate rate control  2. DOE: Likely related to intermittent tachycardia in the setting of pancreatitis.  Appears to be euvolemic on physical exam.  Lungs clear without any significant  rales or rhonchi.  3. Acute pancreatitis: Lipase elevated to 1500.  Also has elevated liver enzyme and bilirubin.  Patient underwent MRCP which suggest acute pancreatitis.  Seen by both general surgery and GI service who recommended conservative management.  4. Hypertension: Blood pressure in the 130s to 160s range systolic at this time.  5. Hyperlipidemia: Temporarily of Lipitor given elevated liver enzyme.  Likely will restart in the future.  6. DM2: Managed by primary service      For questions or updates, please contact [facility] Please consult [email or web address] for contact info under     Ramond Dial, [place]  03/26/2020 5:04 PM

## 2020-03-26 NOTE — Progress Notes (Signed)
Notified Ogbata, MD that patient is in Afib with sustained HR 140's

## 2020-03-27 ENCOUNTER — Inpatient Hospital Stay (HOSPITAL_COMMUNITY): Payer: Medicare Other

## 2020-03-27 DIAGNOSIS — I4891 Unspecified atrial fibrillation: Secondary | ICD-10-CM

## 2020-03-27 LAB — TROPONIN I (HIGH SENSITIVITY)
Troponin I (High Sensitivity): 27 ng/L — ABNORMAL HIGH (ref <18)
Troponin I (High Sensitivity): 22 ng/L — ABNORMAL HIGH (ref <18)

## 2020-03-27 LAB — COMPREHENSIVE METABOLIC PANEL WITH GFR
ALT: 126 U/L — ABNORMAL HIGH (ref 0–44)
AST: 54 U/L — ABNORMAL HIGH (ref 15–41)
Albumin: 2.6 g/dL — ABNORMAL LOW (ref 3.5–5.0)
Alkaline Phosphatase: 169 U/L — ABNORMAL HIGH (ref 38–126)
Anion gap: 15 (ref 5–15)
BUN: 13 mg/dL (ref 8–23)
CO2: 24 mmol/L (ref 22–32)
Calcium: 8.2 mg/dL — ABNORMAL LOW (ref 8.9–10.3)
Chloride: 99 mmol/L (ref 98–111)
Creatinine, Ser: 0.94 mg/dL (ref 0.61–1.24)
GFR calc Af Amer: >60 mL/min
GFR calc non Af Amer: >60 mL/min
Glucose, Bld: 122 mg/dL — ABNORMAL HIGH (ref 70–99)
Potassium: 3.4 mmol/L — ABNORMAL LOW (ref 3.5–5.1)
Sodium: 138 mmol/L (ref 135–145)
Total Bilirubin: 3.5 mg/dL — ABNORMAL HIGH (ref 0.3–1.2)
Total Protein: 5.6 g/dL — ABNORMAL LOW (ref 6.5–8.1)

## 2020-03-27 LAB — CBC WITH DIFFERENTIAL/PLATELET
Abs Immature Granulocytes: 0.2 10*3/uL — ABNORMAL HIGH (ref 0.00–0.07)
Basophils Absolute: 0 10*3/uL (ref 0.0–0.1)
Basophils Relative: 0 %
Eosinophils Absolute: 0.1 10*3/uL (ref 0.0–0.5)
Eosinophils Relative: 0 %
HCT: 42.9 % (ref 39.0–52.0)
Hemoglobin: 14.7 g/dL (ref 13.0–17.0)
Immature Granulocytes: 1 %
Lymphocytes Relative: 4 %
Lymphs Abs: 0.6 10*3/uL — ABNORMAL LOW (ref 0.7–4.0)
MCH: 29.7 pg (ref 26.0–34.0)
MCHC: 34.3 g/dL (ref 30.0–36.0)
MCV: 86.7 fL (ref 80.0–100.0)
Monocytes Absolute: 1 10*3/uL (ref 0.1–1.0)
Monocytes Relative: 7 %
Neutro Abs: 12.5 10*3/uL — ABNORMAL HIGH (ref 1.7–7.7)
Neutrophils Relative %: 88 %
Platelets: 149 10*3/uL — ABNORMAL LOW (ref 150–400)
RBC: 4.95 MIL/uL (ref 4.22–5.81)
RDW: 13.3 % (ref 11.5–15.5)
WBC: 14.4 10*3/uL — ABNORMAL HIGH (ref 4.0–10.5)
nRBC: 0 % (ref 0.0–0.2)

## 2020-03-27 LAB — GLUCOSE, CAPILLARY
Glucose-Capillary: 108 mg/dL — ABNORMAL HIGH (ref 70–99)
Glucose-Capillary: 113 mg/dL — ABNORMAL HIGH (ref 70–99)
Glucose-Capillary: 115 mg/dL — ABNORMAL HIGH (ref 70–99)
Glucose-Capillary: 120 mg/dL — ABNORMAL HIGH (ref 70–99)
Glucose-Capillary: 158 mg/dL — ABNORMAL HIGH (ref 70–99)
Glucose-Capillary: 158 mg/dL — ABNORMAL HIGH (ref 70–99)

## 2020-03-27 LAB — CULTURE, BLOOD (ROUTINE X 2)
Special Requests: ADEQUATE
Special Requests: ADEQUATE

## 2020-03-27 LAB — ECHOCARDIOGRAM COMPLETE
Height: 68 in
Weight: 3880 oz

## 2020-03-27 LAB — PHOSPHORUS: Phosphorus: 2.2 mg/dL — ABNORMAL LOW (ref 2.5–4.6)

## 2020-03-27 LAB — MAGNESIUM: Magnesium: 1.9 mg/dL (ref 1.7–2.4)

## 2020-03-27 LAB — LIPASE, BLOOD: Lipase: 154 U/L — ABNORMAL HIGH (ref 11–51)

## 2020-03-27 MED ORDER — MORPHINE SULFATE (PF) 2 MG/ML IV SOLN
1.0000 mg | INTRAVENOUS | Status: DC | PRN
  Administered 2020-03-27: 1 mg via INTRAVENOUS
  Filled 2020-03-27: qty 1

## 2020-03-27 MED ORDER — PANTOPRAZOLE SODIUM 40 MG IV SOLR
40.0000 mg | Freq: Two times a day (BID) | INTRAVENOUS | Status: DC
  Administered 2020-03-27 – 2020-03-29 (×6): 40 mg via INTRAVENOUS
  Filled 2020-03-27 (×7): qty 40

## 2020-03-27 MED ORDER — LIDOCAINE 5 % EX PTCH
2.0000 | MEDICATED_PATCH | CUTANEOUS | Status: DC
  Administered 2020-03-27 – 2020-03-30 (×4): 2 via TRANSDERMAL
  Filled 2020-03-27 (×5): qty 2

## 2020-03-27 MED ORDER — SODIUM CHLORIDE 0.9 % IV SOLN
3.0000 g | Freq: Four times a day (QID) | INTRAVENOUS | Status: DC
  Administered 2020-03-27 – 2020-03-30 (×12): 3 g via INTRAVENOUS
  Filled 2020-03-27: qty 3
  Filled 2020-03-27 (×3): qty 8
  Filled 2020-03-27 (×2): qty 3
  Filled 2020-03-27 (×2): qty 8
  Filled 2020-03-27: qty 3
  Filled 2020-03-27 (×2): qty 8
  Filled 2020-03-27 (×2): qty 3
  Filled 2020-03-27: qty 8

## 2020-03-27 MED ORDER — MAGNESIUM SULFATE 2 GM/50ML IV SOLN
2.0000 g | Freq: Once | INTRAVENOUS | Status: AC
  Administered 2020-03-27: 2 g via INTRAVENOUS
  Filled 2020-03-27: qty 50

## 2020-03-27 MED ORDER — LISINOPRIL 20 MG PO TABS
20.0000 mg | ORAL_TABLET | Freq: Every day | ORAL | Status: DC
  Administered 2020-03-27 – 2020-03-30 (×4): 20 mg via ORAL
  Filled 2020-03-27 (×4): qty 1

## 2020-03-27 MED ORDER — POTASSIUM CHLORIDE CRYS ER 20 MEQ PO TBCR
40.0000 meq | EXTENDED_RELEASE_TABLET | Freq: Once | ORAL | Status: AC
  Administered 2020-03-27: 40 meq via ORAL
  Filled 2020-03-27: qty 2

## 2020-03-27 NOTE — Progress Notes (Signed)
Cardiology Progress Note  Patient ID: Vincent Collins MRN: [id number] DOB: July 22, 1941 Date of Encounter: 03/27/2020  Primary Cardiologist: No primary care provider on file.  Subjective  Converted back to normal sinus rhythm.  Reports chest tightness this morning.  Euvolemic on examination.  EKG without acute ischemic changes.  Troponins pending.  ROS:  All other ROS reviewed and negative. Pertinent positives noted in the HPI.     Inpatient Medications  Scheduled Meds: . fluticasone  2 spray Each Nare Daily  . heparin injection (subcutaneous)  5,000 Units Subcutaneous Q8H  . insulin aspart  0-9 Units Subcutaneous Q4H  . lidocaine  2 patch Transdermal Q24H  . lisinopril  20 mg Oral Daily  . metoprolol tartrate  25 mg Oral BID  . ondansetron  4 mg Oral Once  . pantoprazole (PROTONIX) IV  40 mg Intravenous Q12H  . sodium chloride flush  3 mL Intravenous Q12H  . tamsulosin  0.8 mg Oral QPC supper   Continuous Infusions: . ampicillin-sulbactam (UNASYN) IV     PRN Meds: acetaminophen **OR** acetaminophen, fluticasone, guaiFENesin, levalbuterol, morphine injection, ondansetron (ZOFRAN) IV   Vital Signs   Vitals:   03/27/20 0148 03/27/20 0503 03/27/20 0505 03/27/20 0729  BP: (!) 166/99 (!) 161/86  (!) 178/92  Pulse: 90 96  92  Resp: (!) 22 (!) 27  (!) 24  Temp: 97.7 F (36.5 C) 98 F (36.7 C)  97.9 F (36.6 C)  TempSrc: Oral Oral  Oral  SpO2: 97% 93%  95%  Weight:   110 kg   Height:        Intake/Output Summary (Last 24 hours) at 03/27/2020 1105 Last data filed at 03/27/2020 0856 Gross per 24 hour  Intake 0 ml  Output 850 ml  Net -850 ml   Last 3 Weights 03/27/2020 03/26/2020 03/24/2020  Weight (lbs) 242 lb 8 oz 245 lb 9.5 oz 245 lb  Weight (kg) 109.997 kg 111.4 kg 111.131 kg      Telemetry  Overnight telemetry shows normal sinus rhythm with heart rate in the 90s, which I personally reviewed.   ECG  The most recent ECG shows normal sinus rhythm, no acute ischemic  changes, questionable inferior infarct, PVCs noted, which I personally reviewed.   Physical Exam   Vitals:   03/27/20 0148 03/27/20 0503 03/27/20 0505 03/27/20 0729  BP: (!) 166/99 (!) 161/86  (!) 178/92  Pulse: 90 96  92  Resp: (!) 22 (!) 27  (!) 24  Temp: 97.7 F (36.5 C) 98 F (36.7 C)  97.9 F (36.6 C)  TempSrc: Oral Oral  Oral  SpO2: 97% 93%  95%  Weight:   110 kg   Height:         Intake/Output Summary (Last 24 hours) at 03/27/2020 1105 Last data filed at 03/27/2020 0856 Gross per 24 hour  Intake 0 ml  Output 850 ml  Net -850 ml    Last 3 Weights 03/27/2020 03/26/2020 03/24/2020  Weight (lbs) 242 lb 8 oz 245 lb 9.5 oz 245 lb  Weight (kg) 109.997 kg 111.4 kg 111.131 kg    Body mass index is 36.87 kg/m.   General: Well nourished, well developed, tachypnea noted Head: Atraumatic, normal size  Eyes: PEERLA, EOMI  Neck: Supple, no JVD Endocrine: No thryomegaly Cardiac: Normal S1, S2; RRR; no murmurs, rubs, or gallops Lungs: Clear to auscultation bilaterally, no wheezing, rhonchi or rales  Abd: Soft, nontender, no hepatomegaly  Ext: No edema, pulses 2+ Musculoskeletal: No deformities,  BUE and BLE strength normal and equal Skin: Warm and dry, no rashes   Neuro: Alert and oriented to person, place, time, and situation, CNII-XII grossly intact, no focal deficits  Psych: Normal mood and affect   Labs  High Sensitivity Troponin:   Recent Labs  Lab 03/26/20 1517 03/26/20 1840  TROPONINIHS 34* 38*     Cardiac EnzymesNo results for input(s): TROPONINI in the last 168 hours. No results for input(s): TROPIPOC in the last 168 hours.  Chemistry Recent Labs  Lab 03/25/20 0420 03/26/20 0256 03/27/20 0527  NA 138 139 138  K 3.3* 3.8 3.4*  CL 104 105 99  CO2 22 21* 24  GLUCOSE 178* 147* 122*  BUN 19 18 13   CREATININE 1.49* 1.13 0.94  CALCIUM 8.1* 7.8* 8.2*  PROT 5.3* 5.0* 5.6*  ALBUMIN 2.7* 2.6*  2.6* 2.6*  AST 266* 120* 54*  ALT 258* 184* 126*  ALKPHOS 145* 145*  169*  BILITOT 5.1* 6.5* 3.5*  GFRNONAA 45* >60 >60  GFRAA 52* >60 >60  ANIONGAP 12 13 15     Hematology Recent Labs  Lab 03/25/20 0420 03/26/20 0256 03/27/20 0527  WBC 20.4* 16.7* 14.4*  RBC 5.02 4.78 4.95  HGB 15.0 14.4 14.7  HCT 44.7 42.2 42.9  MCV 89.0 88.3 86.7  MCH 29.9 30.1 29.7  MCHC 33.6 34.1 34.3  RDW 13.2 13.3 13.3  PLT 161 137* 149*   BNP Recent Labs  Lab 03/26/20 0256  BNP 230.0*    DDimer  Recent Labs  Lab 03/26/20 1517  DDIMER 6.11*     Radiology  CT ANGIO CHEST PE W OR WO CONTRAST  Result Date: 03/26/2020 CLINICAL DATA:  Shortness of breath EXAM: CT ANGIOGRAPHY CHEST WITH CONTRAST TECHNIQUE: Multidetector CT imaging of the chest was performed using the standard protocol during bolus administration of intravenous contrast. Multiplanar CT image reconstructions and MIPs were obtained to evaluate the vascular anatomy. CONTRAST:  03/28/20 OMNIPAQUE IOHEXOL 350 MG/ML SOLN COMPARISON:  Plain film from earlier in the same day, MRI from the previous day. FINDINGS: Cardiovascular: Thoracic aorta and its branches demonstrate atherosclerotic calcifications without aneurysmal dilatation. No dissection is seen. No cardiac enlargement is seen. Minimal pericardial fluid is noted. Coronary calcifications are seen. The pulmonary artery shows a normal branching pattern. No definitive filling defects to suggest pulmonary emboli are noted. Mediastinum/Nodes: Thoracic inlet is within normal limits. No hilar or mediastinal adenopathy is noted. The esophagus as visualized is within normal limits. Lungs/Pleura: Considerable motion artifact is noted. The left lung is well aerated without focal infiltrate or sizable effusion. Small right-sided pleural effusion and right lower lobe atelectatic changes are seen. No focal confluent infiltrate is noted. No parenchymal nodules are seen. Upper Abdomen: Visualized upper abdomen again demonstrates some mild peripancreatic inflammatory change consistent  with the known history of pancreatitis. Renal cysts are seen. Musculoskeletal: Degenerative changes of the thoracic spine are seen. Healing rib fractures are noted on the right involving the sixth through eighth ribs anteriorly. No definitive left rib fractures are seen. No compression deformities are seen. Review of the MIP images confirms the above findings. IMPRESSION: Multiple right-sided rib fractures with healing. A small right effusion is noted which is likely compensatory in nature. Mild associated atelectasis is noted. No pneumothorax is seen. Mild changes of pancreatitis similar to that noted on prior MRI from the previous day. Aortic Atherosclerosis (ICD10-I70.0). Electronically Signed   By: 03/28/2020 M.D.   On: 03/26/2020 20:51   DG CHEST PORT 1  VIEW  Result Date: 03/26/2020 CLINICAL DATA:  Short of breath, COPD, previous tobacco abuse, diabetes, hypertension EXAM: PORTABLE CHEST 1 VIEW COMPARISON:  03/26/2020 at 9:57 a.m. FINDINGS: Single frontal view of the chest demonstrates a stable cardiac silhouette. No airspace disease, effusion, or pneumothorax. Improved aeration at the lung bases. No acute bony abnormalities. IMPRESSION: 1. No acute process. Electronically Signed   By: Sharlet Salina M.D.   On: 03/26/2020 18:42   DG CHEST PORT 1 VIEW  Result Date: 03/26/2020 CLINICAL DATA:  Shortness of breath with cough EXAM: PORTABLE CHEST 1 VIEW COMPARISON:  03/24/2020 FINDINGS: Low volume chest with prominence of the cardiomediastinal silhouette. Mild atelectasis or scar like appearance at the bases. There is no edema, consolidation, effusion, or pneumothorax. No acute osseous finding. IMPRESSION: Minimal scarring or atelectasis at the bases. Electronically Signed   By: Marnee Spring M.D.   On: 03/26/2020 10:09     Patient Profile  Mr. Womac is a 79 yo M with COPD, HTN, DM who was admitted for acute pancreatitis. 03/26/2020 he developed Afib with RVR and was given metoprolol 25 mg po with  conversion back to NSR.   Assessment & Plan   1.  New onset atrial fibrillation with RVR -Secondary to acute pancreatitis -EKG without acute ischemic changes -Troponin mildly elevated and flat -Back in normal sinus rhythm now -This is secondary A. fib and likely does not require long-term treatment.  We will continue metoprolol tartrate for now. -We will hold on anticoagulation as this is acute in the setting of critical illness -Echocardiogram pending -He does have significant coronary calcifications on a CT PE study.  CT negative for PE.  It is reassuring his troponins are mildly elevated in the setting of A. fib with RVR.  We will continue to trend troponins and follow-up echocardiogram.  2.  PVCs -Related to low K and hypomag.  I have ordered replacement of these.  3.  Chest pain -He reports tightness in his chest but mainly shows me his epigastric area.  I suspect this is all related to acute pancreatitis.  We will trend troponins as above.  EKG without acute ischemic changes.  Echocardiogram pending.  For questions or updates, please contact [facility] Please consult [email or web address] for contact info under   Time Spent with Patient: I have spent a total of 35 minutes with patient reviewing hospital notes, telemetry, EKGs, labs and examining the patient as well as establishing an assessment and plan that was discussed with the patient.  > 50% of time was spent in direct patient care.    Signed, Lenna Gilford. Flora Lipps, MD [place]  [facility]  03/27/2020 11:05 AM

## 2020-03-27 NOTE — Progress Notes (Signed)
Echocardiogram 2D Echocardiogram has been performed.  Warren Lacy Alizia Greif 03/27/2020, 3:03 PM

## 2020-03-27 NOTE — Progress Notes (Signed)
Subjective: CC: Patient transferred to 3E last night after episode of chest pain and sob.   Currently no chest pain or sob. Reports that he has some mild soreness around his umbilicus, no other abdominal pain. Occasional nausea. No emesis. Last BM 3/28.   Objective: Vital signs in last 24 hours: Temp:  [97.7 F (36.5 C)-98.4 F (36.9 C)] 97.9 F (36.6 C) (03/30 0729) Pulse Rate:  [86-110] 92 (03/30 0729) Resp:  [18-27] 27 (03/30 0503) BP: (137-178)/(78-99) 178/92 (03/30 0729) SpO2:  [93 %-97 %] 95 % (03/30 0729) Weight:  [110 kg-111.4 kg] 110 kg (03/30 0505) Last BM Date: 03/25/20  Intake/Output from previous day: 03/29 0701 - 03/30 0700 In: 0  Out: 950 [Urine:950] Intake/Output this shift: No intake/output data recorded.  PE: Gen:  Alert, NAD, pleasant Card:  RRR Pulm:  Tachypneic, CTAB, no W/R/R Abd: Obese, soft, mild epigastric TTP without rebound or guarding, +BS, no HSM. Small umbilical hernia.  Skin: no rashes noted, warm and dry  Lab Results:  Recent Labs    03/26/20 0256 03/27/20 0527  WBC 16.7* 14.4*  HGB 14.4 14.7  HCT 42.2 42.9  PLT 137* 149*   BMET Recent Labs    03/26/20 0256 03/27/20 0527  NA 139 138  K 3.8 3.4*  CL 105 99  CO2 21* 24  GLUCOSE 147* 122*  BUN 18 13  CREATININE 1.13 0.94  CALCIUM 7.8* 8.2*   PT/INR Recent Labs    03/24/20 1820 03/25/20 0420  LABPROT 13.2 14.3  INR 1.0 1.1   CMP     Component Value Date/Time   NA 138 03/27/2020 0527   NA 141 01/04/2019 1117   K 3.4 (L) 03/27/2020 0527   CL 99 03/27/2020 0527   CO2 24 03/27/2020 0527   GLUCOSE 122 (H) 03/27/2020 0527   BUN 13 03/27/2020 0527   BUN 19 01/04/2019 1117   CREATININE 0.94 03/27/2020 0527   CALCIUM 8.2 (L) 03/27/2020 0527   PROT 5.6 (L) 03/27/2020 0527   PROT 6.2 01/04/2019 1117   ALBUMIN 2.6 (L) 03/27/2020 0527   ALBUMIN 4.1 01/04/2019 1117   AST 54 (H) 03/27/2020 0527   ALT 126 (H) 03/27/2020 0527   ALKPHOS 169 (H) 03/27/2020 0527    BILITOT 3.5 (H) 03/27/2020 0527   BILITOT 1.0 01/04/2019 1117   GFRNONAA >60 03/27/2020 0527   GFRAA >60 03/27/2020 0527   Lipase     Component Value Date/Time   LIPASE 154 (H) 03/27/2020 0527       Studies/Results: CT ANGIO CHEST PE W OR WO CONTRAST  Result Date: 03/26/2020 CLINICAL DATA:  Shortness of breath EXAM: CT ANGIOGRAPHY CHEST WITH CONTRAST TECHNIQUE: Multidetector CT imaging of the chest was performed using the standard protocol during bolus administration of intravenous contrast. Multiplanar CT image reconstructions and MIPs were obtained to evaluate the vascular anatomy. CONTRAST:  OMNIPAQUE IOHEXOL 350 MG/ML SOLN COMPARISON:  Plain film from earlier in the same day, MRI from the previous day. FINDINGS: Cardiovascular: Thoracic aorta and its branches demonstrate atherosclerotic calcifications without aneurysmal dilatation. No dissection is seen. No cardiac enlargement is seen. Minimal pericardial fluid is noted. Coronary calcifications are seen. The pulmonary artery shows a normal branching pattern. No definitive filling defects to suggest pulmonary emboli are noted. Mediastinum/Nodes: Thoracic inlet is within normal limits. No hilar or mediastinal adenopathy is noted. The esophagus as visualized is within normal limits. Lungs/Pleura: Considerable motion artifact is noted. The left lung is well  aerated without focal infiltrate or sizable effusion. Small right-sided pleural effusion and right lower lobe atelectatic changes are seen. No focal confluent infiltrate is noted. No parenchymal nodules are seen. Upper Abdomen: Visualized upper abdomen again demonstrates some mild peripancreatic inflammatory change consistent with the known history of pancreatitis. Renal cysts are seen. Musculoskeletal: Degenerative changes of the thoracic spine are seen. Healing rib fractures are noted on the right involving the sixth through eighth ribs anteriorly. No definitive left rib fractures are  seen. No compression deformities are seen. Review of the MIP images confirms the above findings. IMPRESSION: Multiple right-sided rib fractures with healing. A small right effusion is noted which is likely compensatory in nature. Mild associated atelectasis is noted. No pneumothorax is seen. Mild changes of pancreatitis similar to that noted on prior MRI from the previous day. Aortic Atherosclerosis (ICD10-I70.0). Electronically Signed   By: Alcide Clever M.D.   On: 03/26/2020 20:51   DG CHEST PORT 1 VIEW  Result Date: 03/26/2020 CLINICAL DATA:  Short of breath, COPD, previous tobacco abuse, diabetes, hypertension EXAM: PORTABLE CHEST 1 VIEW COMPARISON:  03/26/2020 at 9:57 a.m. FINDINGS: Single frontal view of the chest demonstrates a stable cardiac silhouette. No airspace disease, effusion, or pneumothorax. Improved aeration at the lung bases. No acute bony abnormalities. IMPRESSION: 1. No acute process. Electronically Signed   By: Sharlet Salina M.D.   On: 03/26/2020 18:42   DG CHEST PORT 1 VIEW  Result Date: 03/26/2020 CLINICAL DATA:  Shortness of breath with cough EXAM: PORTABLE CHEST 1 VIEW COMPARISON:  03/24/2020 FINDINGS: Low volume chest with prominence of the cardiomediastinal silhouette. Mild atelectasis or scar like appearance at the bases. There is no edema, consolidation, effusion, or pneumothorax. No acute osseous finding. IMPRESSION: Minimal scarring or atelectasis at the bases. Electronically Signed   By: Marnee Spring M.D.   On: 03/26/2020 10:09    Anti-infectives: Anti-infectives (From admission, onward)   Start     Dose/Rate Route Frequency Ordered Stop   03/25/20 1415  cefTRIAXone (ROCEPHIN) 2 g in sodium chloride 0.9 % 100 mL IVPB     2 g 200 mL/hr over 30 Minutes Intravenous Every 24 hours 03/25/20 1405     03/25/20 1415  metroNIDAZOLE (FLAGYL) IVPB 500 mg     500 mg 100 mL/hr over 60 Minutes Intravenous Every 8 hours 03/25/20 1405     03/25/20 0300  piperacillin-tazobactam  (ZOSYN) IVPB 3.375 g  Status:  Discontinued     3.375 g 12.5 mL/hr over 240 Minutes Intravenous Every 8 hours 03/25/20 0252 03/25/20 1405   03/24/20 1915  piperacillin-tazobactam (ZOSYN) IVPB 3.375 g     3.375 g 100 mL/hr over 30 Minutes Intravenous  Once 03/24/20 1910 03/24/20 1959       Assessment/Plan HTN COPD HLD GERD DM CP and SOB  A Fib w/ RVR - Per Cards and TRH -  Pancreatitis of unknown etiology ?Acalculous cholecystitis - triglycerides normal, denies alcohol use - no evidence of gallstones on imaging (u/s and MRCP)  - Lipase 1476 > 571 > 154 - LFT's downtrending. T bili 3.5  FEN -NPO/IVFs (okay for clears today from our standpoint today) VTE -SCDs, heparin subq ID -zosyn 3/27>>3/28, rocephin/flagyl 3/28>> Foley - none Follow up - TBD  Plan: Continue medical management of pancreatitis. GI following. Continue to trend lipase and LFTs. After resolution of pancreatitis may consider cholecystectomy to rule out biliary source.    LOS: 2 days    Jacinto Halim , [facility]  Surgery 03/27/2020, 9:08 AM Please see Amion for pager number during day hours 7:00am-4:30pm

## 2020-03-27 NOTE — Progress Notes (Signed)
Pt suddenly wakes up with complains of chest pain/ tightness which he specify as first time  Pain  on the chest and not pain on the  Abdomen. EKG taken (see file), Lungs were wheezy, xoponex given as Per MAR. BP (!) 166/99   Pulse 90   Temp 97.7 F (36.5 C) (Oral)   Resp (!) 22   Ht 5\' 8"  (1.727 m)   Wt 111.4 kg   SpO2 97%   BMI 37.34 kg/m .  PA KIRBY was paged. Awaitng  Will monitor.

## 2020-03-27 NOTE — Progress Notes (Signed)
[facility] Progress Note  Subjective: The patient was transferred last night because he had atrial fibrillation with rapid ventricular response and chest pain.  Cardiology saw the patient.  No evidence of MI.  Surgery has seen today and does not feel he needs cholecystectomy at this time.  Lipase is better.  Objective: Vital signs in last 24 hours: Temp:  [97.7 F (36.5 C)-98.4 F (36.9 C)] 97.9 F (36.6 C) (03/30 0729) Pulse Rate:  [86-110] 92 (03/30 0729) Resp:  [18-27] 27 (03/30 0503) BP: (137-178)/(78-99) 178/92 (03/30 0729) SpO2:  [93 %-97 %] 95 % (03/30 0729) Weight:  [110 kg-111.4 kg] 110 kg (03/30 0505) Weight change:    PE:  No distress  Epigastric discomfort  Heart regular rhythm  Lungs clear  Lab Results: Results for orders placed or performed during the hospital encounter of 03/24/20 (from the past 24 hour(s))  Glucose, capillary     Status: Abnormal   Collection Time: 03/26/20 11:58 AM  Result Value Ref Range   Glucose-Capillary 125 (H) 70 - 99 mg/dL  Troponin I (High Sensitivity)     Status: Abnormal   Collection Time: 03/26/20  3:17 PM  Result Value Ref Range   Troponin I (High Sensitivity) 34 (H) <18 ng/L  D-dimer, quantitative (not at [facility])     Status: Abnormal   Collection Time: 03/26/20  3:17 PM  Result Value Ref Range   D-Dimer, Quant 6.11 (H) 0.00 - 0.50 ug/mL-FEU  Glucose, capillary     Status: Abnormal   Collection Time: 03/26/20  4:04 PM  Result Value Ref Range   Glucose-Capillary 116 (H) 70 - 99 mg/dL  Glucose, capillary     Status: Abnormal   Collection Time: 03/26/20  5:34 PM  Result Value Ref Range   Glucose-Capillary 125 (H) 70 - 99 mg/dL  Blood gas, arterial     Status: Abnormal   Collection Time: 03/26/20  6:13 PM  Result Value Ref Range   FIO2 32.00    pH, Arterial 7.433 7.350 - 7.450   pCO2 arterial 38.5 32.0 - 48.0 mmHg   pO2, Arterial 123 (H) 83.0 - 108.0 mmHg   Bicarbonate 25.3 20.0 - 28.0 mmol/L   Acid-Base  Excess 1.4 0.0 - 2.0 mmol/L   O2 Saturation 98.7 %   Patient temperature 36.9    Collection site LEFT RADIAL    Drawn by [id number]    Sample type ARTERIAL DRAW    Allens test (pass/fail) PASS PASS  Troponin I (High Sensitivity)     Status: Abnormal   Collection Time: 03/26/20  6:40 PM  Result Value Ref Range   Troponin I (High Sensitivity) 38 (H) <18 ng/L  Glucose, capillary     Status: Abnormal   Collection Time: 03/26/20  9:34 PM  Result Value Ref Range   Glucose-Capillary 134 (H) 70 - 99 mg/dL  Glucose, capillary     Status: Abnormal   Collection Time: 03/27/20 12:13 AM  Result Value Ref Range   Glucose-Capillary 115 (H) 70 - 99 mg/dL  Glucose, capillary     Status: Abnormal   Collection Time: 03/27/20  4:57 AM  Result Value Ref Range   Glucose-Capillary 113 (H) 70 - 99 mg/dL  Comprehensive metabolic panel     Status: Abnormal   Collection Time: 03/27/20  5:27 AM  Result Value Ref Range   Sodium 138 135 - 145 mmol/L   Potassium 3.4 (L) 3.5 - 5.1 mmol/L   Chloride 99 98 - 111 mmol/L  CO2 24 22 - 32 mmol/L   Glucose, Bld 122 (H) 70 - 99 mg/dL   BUN 13 8 - 23 mg/dL   Creatinine, Ser 8.88 0.61 - 1.24 mg/dL   Calcium 8.2 (L) 8.9 - 10.3 mg/dL   Total Protein 5.6 (L) 6.5 - 8.1 g/dL   Albumin 2.6 (L) 3.5 - 5.0 g/dL   AST 54 (H) 15 - 41 U/L   ALT 126 (H) 0 - 44 U/L   Alkaline Phosphatase 169 (H) 38 - 126 U/L   Total Bilirubin 3.5 (H) 0.3 - 1.2 mg/dL   GFR calc non Af Amer >60 >60 mL/min   GFR calc Af Amer >60 >60 mL/min   Anion gap 15 5 - 15  Lipase, blood     Status: Abnormal   Collection Time: 03/27/20  5:27 AM  Result Value Ref Range   Lipase 154 (H) 11 - 51 U/L  Magnesium     Status: None   Collection Time: 03/27/20  5:27 AM  Result Value Ref Range   Magnesium 1.9 1.7 - 2.4 mg/dL  CBC with Differential/Platelet     Status: Abnormal   Collection Time: 03/27/20  5:27 AM  Result Value Ref Range   WBC 14.4 (H) 4.0 - 10.5 K/uL   RBC 4.95 4.22 - 5.81 MIL/uL   Hemoglobin  14.7 13.0 - 17.0 g/dL   HCT 91.6 94.5 - 03.8 %   MCV 86.7 80.0 - 100.0 fL   MCH 29.7 26.0 - 34.0 pg   MCHC 34.3 30.0 - 36.0 g/dL   RDW 88.2 80.0 - 34.9 %   Platelets 149 (L) 150 - 400 K/uL   nRBC 0.0 0.0 - 0.2 %   Neutrophils Relative % 88 %   Neutro Abs 12.5 (H) 1.7 - 7.7 K/uL   Lymphocytes Relative 4 %   Lymphs Abs 0.6 (L) 0.7 - 4.0 K/uL   Monocytes Relative 7 %   Monocytes Absolute 1.0 0.1 - 1.0 K/uL   Eosinophils Relative 0 %   Eosinophils Absolute 0.1 0.0 - 0.5 K/uL   Basophils Relative 0 %   Basophils Absolute 0.0 0.0 - 0.1 K/uL   Immature Granulocytes 1 %   Abs Immature Granulocytes 0.20 (H) 0.00 - 0.07 K/uL  Phosphorus     Status: Abnormal   Collection Time: 03/27/20  5:27 AM  Result Value Ref Range   Phosphorus 2.2 (L) 2.5 - 4.6 mg/dL  Glucose, capillary     Status: Abnormal   Collection Time: 03/27/20  7:40 AM  Result Value Ref Range   Glucose-Capillary 120 (H) 70 - 99 mg/dL  Glucose, capillary     Status: Abnormal   Collection Time: 03/27/20 10:26 AM  Result Value Ref Range   Glucose-Capillary 108 (H) 70 - 99 mg/dL    Studies/Results: CT ANGIO CHEST PE W OR WO CONTRAST  Result Date: 03/26/2020 CLINICAL DATA:  Shortness of breath EXAM: CT ANGIOGRAPHY CHEST WITH CONTRAST TECHNIQUE: Multidetector CT imaging of the chest was performed using the standard protocol during bolus administration of intravenous contrast. Multiplanar CT image reconstructions and MIPs were obtained to evaluate the vascular anatomy. CONTRAST:  OMNIPAQUE IOHEXOL 350 MG/ML SOLN COMPARISON:  Plain film from earlier in the same day, MRI from the previous day. FINDINGS: Cardiovascular: Thoracic aorta and its branches demonstrate atherosclerotic calcifications without aneurysmal dilatation. No dissection is seen. No cardiac enlargement is seen. Minimal pericardial fluid is noted. Coronary calcifications are seen. The pulmonary artery shows a normal branching pattern.  No definitive filling defects to  suggest pulmonary emboli are noted. Mediastinum/Nodes: Thoracic inlet is within normal limits. No hilar or mediastinal adenopathy is noted. The esophagus as visualized is within normal limits. Lungs/Pleura: Considerable motion artifact is noted. The left lung is well aerated without focal infiltrate or sizable effusion. Small right-sided pleural effusion and right lower lobe atelectatic changes are seen. No focal confluent infiltrate is noted. No parenchymal nodules are seen. Upper Abdomen: Visualized upper abdomen again demonstrates some mild peripancreatic inflammatory change consistent with the known history of pancreatitis. Renal cysts are seen. Musculoskeletal: Degenerative changes of the thoracic spine are seen. Healing rib fractures are noted on the right involving the sixth through eighth ribs anteriorly. No definitive left rib fractures are seen. No compression deformities are seen. Review of the MIP images confirms the above findings. IMPRESSION: Multiple right-sided rib fractures with healing. A small right effusion is noted which is likely compensatory in nature. Mild associated atelectasis is noted. No pneumothorax is seen. Mild changes of pancreatitis similar to that noted on prior MRI from the previous day. Aortic Atherosclerosis (ICD10-I70.0). Electronically Signed   By: Alcide Clever M.D.   On: 03/26/2020 20:51   DG CHEST PORT 1 VIEW  Result Date: 03/26/2020 CLINICAL DATA:  Short of breath, COPD, previous tobacco abuse, diabetes, hypertension EXAM: PORTABLE CHEST 1 VIEW COMPARISON:  03/26/2020 at 9:57 a.m. FINDINGS: Single frontal view of the chest demonstrates a stable cardiac silhouette. No airspace disease, effusion, or pneumothorax. Improved aeration at the lung bases. No acute bony abnormalities. IMPRESSION: 1. No acute process. Electronically Signed   By: Sharlet Salina M.D.   On: 03/26/2020 18:42      Assessment: Acute pancreatitis.  Etiology is not entirely clear but given elevated  LFTs I suspect this is of biliary origin.  Plan:   Continue supportive care.  Give clear liquids today    Gwenevere Abbot 03/27/2020, 10:46 AM  Pager: [phone] If no answer or after 5 PM call [phone]

## 2020-03-27 NOTE — Progress Notes (Signed)
PROGRESS NOTE    Vincent Collins  MAU:[id number] DOB: 11/24/1941 DOA: 03/24/2020 PCP: Dettinger, Elige Radon, MD   Brief Narrative:  79 year old with history of COPD, DM 2, HTN, HLD, left-sided hydronephrosis, diverticulitis currently admitted for acute pancreatitis and sepsis secondary to E. coli bacteremia with possible acute cholangitis.  Hospital course complicated by atrial fibrillation with RVR.   Assessment & Plan:   Principal Problem:   Cholangitis Active Problems:   Essential hypertension   COPD (chronic obstructive pulmonary disease) (HCC)   Type 2 diabetes mellitus (HCC)   Acute pancreatitis, unclear etiology Acalculus cholecystitis -Conservative management with supportive care at this time.  Initially on Zosyn, now on Rocephin/Flagyl. -Patient LFTs trending downwards -MRCP showed acute necrotizing pancreatitis, cholecystitis without stones  Atrial fibrillation with RVR -Spontaneously resolved.  Continue metoprolol 25 mg twice daily.  IV Cardizem if necessary. -Replete electrolytes as appropriate -Echocardiogram is pending  E. coli bacteremia -Pansensitive.  Transition antibiotics to Unasyn.  Atypical chest pain -Suspect from previous rib fractures which are healing versus GERD on pancreatitis.  Continue PPI. -EKG is unremarkable, cardiac enzymes remain negative.  Essential hypertension, uncontrolled -Resume lisinopril 20 mg daily  Hyperlipidemia -On hold due to elevated LFTs  DM2 -Sliding scale and Accu-Cheks.  BPH -Flomax  DVT prophylaxis: Subcu heparin Code Status: Full code Family Communication: None Disposition Plan:   Patient From= home  Patient Anticipated D/C place= Home  Barriers= maintain hospital stay for IV antibiotics, will monitor his oral intake.  Still on clear liquid diet today    Subjective: Denies any complaints besides squeezing chest discomfort which started last night.  EKG is unremarkable.  Tells me he has issues with  heartburn.  He is aware of broken ribs from the previous fall about a year ago.  Review of Systems Otherwise negative except as per HPI, including: General: Denies fever, chills, night sweats or unintended weight loss. Resp: Denies cough, wheezing, shortness of breath. Cardiac: Deniespalpitations, orthopnea, paroxysmal nocturnal dyspnea. GI: Denies abdominal pain, nausea, vomiting, diarrhea or constipation GU: Denies dysuria, frequency, hesitancy or incontinence MS: Denies muscle aches, joint pain or swelling Neuro: Denies headache, neurologic deficits (focal weakness, numbness, tingling), abnormal gait Psych: Denies anxiety, depression, SI/HI/AVH Skin: Denies new rashes or lesions ID: Denies sick contacts, exotic exposures, travel  Examination:  General exam: Appears calm and comfortable  Respiratory system: Clear to auscultation. Respiratory effort normal. Cardiovascular system: S1 & S2 heard, RRR. No JVD, murmurs, rubs, gallops or clicks. No pedal edema. Gastrointestinal system: Abdomen is nondistended, soft and nontender. No organomegaly or masses felt. Normal bowel sounds heard. Central nervous system: Alert and oriented. No focal neurological deficits. Extremities: Symmetric 5 x 5 power. Skin: No rashes, lesions or ulcers Psychiatry: Judgement and insight appear normal. Mood & affect appropriate.     Objective: Vitals:   03/27/20 0148 03/27/20 0503 03/27/20 0505 03/27/20 0729  BP: (!) 166/99 (!) 161/86  (!) 178/92  Pulse: 90 96  92  Resp: (!) 22 (!) 27    Temp: 97.7 F (36.5 C) 98 F (36.7 C)  97.9 F (36.6 C)  TempSrc: Oral Oral  Oral  SpO2: 97% 93%  95%  Weight:   110 kg   Height:        Intake/Output Summary (Last 24 hours) at 03/27/2020 0810 Last data filed at 03/27/2020 0100 Gross per 24 hour  Intake 0 ml  Output 950 ml  Net -950 ml   Filed Weights   03/24/20 1822 03/26/20 1727 03/27/20 0505  Weight: 111.1 kg 111.4 kg 110 kg     Data Reviewed:    CBC: Recent Labs  Lab 03/24/20 1820 03/25/20 0420 03/26/20 0256 03/27/20 0527  WBC 7.7 20.4* 16.7* 14.4*  NEUTROABS 7.1 18.7* 14.7* 12.5*  HGB 16.1 15.0 14.4 14.7  HCT 47.7 44.7 42.2 42.9  MCV 90.0 89.0 88.3 86.7  PLT 178 161 137* 149*   Basic Metabolic Panel: Recent Labs  Lab 03/24/20 1820 03/25/20 0420 03/26/20 0256 03/27/20 0527  NA 136 138 139 138  K 3.6 3.3* 3.8 3.4*  CL 104 104 105 99  CO2 20* 22 21* 24  GLUCOSE 226* 178* 147* 122*  BUN 21 19 18 13   CREATININE 1.07 1.49* 1.13 0.94  CALCIUM 8.3* 8.1* 7.8* 8.2*  MG  --  1.2* 2.2 1.9  PHOS  --   --  2.5 2.2*   GFR: Estimated Creatinine Clearance: 79.1 mL/min (by C-G formula based on SCr of 0.94 mg/dL). Liver Function Tests: Recent Labs  Lab 03/24/20 1820 03/25/20 0420 03/26/20 0256 03/27/20 0527  AST 352* 266* 120* 54*  ALT 255* 258* 184* 126*  ALKPHOS 195* 145* 145* 169*  BILITOT 4.2* 5.1* 6.5* 3.5*  PROT 6.6 5.3* 5.0* 5.6*  ALBUMIN 3.5 2.7* 2.6*  2.6* 2.6*   Recent Labs  Lab 03/25/20 0140 03/26/20 0256 03/27/20 0527  LIPASE 1,476* 571* 154*   No results for input(s): AMMONIA in the last 168 hours. Coagulation Profile: Recent Labs  Lab 03/24/20 1820 03/25/20 0420  INR 1.0 1.1   Cardiac Enzymes: No results for input(s): CKTOTAL, CKMB, CKMBINDEX, TROPONINI in the last 168 hours. BNP (last 3 results) No results for input(s): PROBNP in the last 8760 hours. HbA1C: Recent Labs    03/25/20 0140  HGBA1C 6.9*   CBG: Recent Labs  Lab 03/26/20 1734 03/26/20 2134 03/27/20 0013 03/27/20 0457 03/27/20 0740  GLUCAP 125* 134* 115* 113* 120*   Lipid Profile: Recent Labs    03/25/20 0839  TRIG 76   Thyroid Function Tests: No results for input(s): TSH, T4TOTAL, FREET4, T3FREE, THYROIDAB in the last 72 hours. Anemia Panel: No results for input(s): VITAMINB12, FOLATE, FERRITIN, TIBC, IRON, RETICCTPCT in the last 72 hours. Sepsis Labs: Recent Labs  Lab 03/25/20 0140 03/25/20 0359  03/25/20 0838 03/25/20 1144  LATICACIDVEN 2.1* 3.0* 2.0* 1.6    Recent Results (from the past 240 hour(s))  Culture, blood (Routine x 2)     Status: None (Preliminary result)   Collection Time: 03/24/20  6:20 PM   Specimen: Left Antecubital; Blood  Result Value Ref Range Status   Specimen Description   Final    LEFT ANTECUBITAL Performed at [facility], [address] [place], [place]    Special Requests   Final    BOTTLES DRAWN AEROBIC AND ANAEROBIC Blood Culture adequate volume Performed at [facility], [address], [place], [place]    Culture  Setup Time   Final    GRAM NEGATIVE RODS IN BOTH AEROBIC AND ANAEROBIC BOTTLES Gram Stain Report Called to,Read Back By and Verified With: BAINE C. @ [place] @ 0852 ON [id number] BY HENDERSON L CRITICAL RESULT CALLED TO, READ BACK BY AND VERIFIED WITH: D. PIERCE, PHARMD AT 1354 ON 03/25/20 BY C. JESSUP, MT. Performed at [facility], [address] [place], [place]    Culture GRAM NEGATIVE RODS  Final   Report Status PENDING  Incomplete  Culture, blood (Routine x 2)  Status: Abnormal (Preliminary result)   Collection Time: 03/24/20  7:01 PM   Specimen: Left Antecubital; Blood  Result Value Ref Range Status   Specimen Description   Final    LEFT ANTECUBITAL Performed at [facility], [address] [place], [place] [address]    Special Requests   Final    BOTTLES DRAWN AEROBIC AND ANAEROBIC Blood Culture adequate volume Performed at [facility], [address] [place], [place] [address]    Culture  Setup Time   Final    GRAM NEGATIVE RODS IN BOTH AEROBIC AND ANAEROBIC BOTTLES Gram Stain Report Called to,Read Back By and Verified With: BAINE C. @ [place] @ 0852 ON [id number] BY HENDERSON L CRITICAL RESULT CALLED TO, READ BACK BY AND VERIFIED WITH: D. PIERCE, PHARMD AT 1354 ON 03/25/20 BY C. JESSUP, MT. Performed at [facility], [address] [place], [place] [address]    Culture  ESCHERICHIA COLI (A)  Final   Report Status PENDING  Incomplete  Blood Culture ID Panel (Reflexed)     Status: Abnormal   Collection Time: 03/24/20  7:01 PM  Result Value Ref Range Status   Enterococcus species NOT DETECTED NOT DETECTED Final   Listeria monocytogenes NOT DETECTED NOT DETECTED Final   Staphylococcus species NOT DETECTED NOT DETECTED Final   Staphylococcus aureus (BCID) NOT DETECTED NOT DETECTED Final   Streptococcus species NOT DETECTED NOT DETECTED Final   Streptococcus agalactiae NOT DETECTED NOT DETECTED Final   Streptococcus pneumoniae NOT DETECTED NOT DETECTED Final   Streptococcus pyogenes NOT DETECTED NOT DETECTED Final   Acinetobacter baumannii NOT DETECTED NOT DETECTED Final   Enterobacteriaceae species DETECTED (A) NOT DETECTED Final    Comment: Enterobacteriaceae represent a large family of gram-negative bacteria, not a single organism. CRITICAL RESULT CALLED TO, READ BACK BY AND VERIFIED WITH: D. PIERCE, PHARMD AT 1354 ON 03/25/20 BY C. JESSUP, MT.    Enterobacter cloacae complex NOT DETECTED NOT DETECTED Final   Escherichia coli DETECTED (A) NOT DETECTED Final    Comment: CRITICAL RESULT CALLED TO, READ BACK BY AND VERIFIED WITH: D. PIERCE, PHARMD AT 1354 ON 03/25/20 BY C. JESSUP, MT.    Klebsiella oxytoca NOT DETECTED NOT DETECTED Final   Klebsiella pneumoniae NOT DETECTED NOT DETECTED Final   Proteus species NOT DETECTED NOT DETECTED Final   Serratia marcescens NOT DETECTED NOT DETECTED Final   Carbapenem resistance NOT DETECTED NOT DETECTED Final   Haemophilus influenzae NOT DETECTED NOT DETECTED Final   Neisseria meningitidis NOT DETECTED NOT DETECTED Final   Pseudomonas aeruginosa NOT DETECTED NOT DETECTED Final   Candida albicans NOT DETECTED NOT DETECTED Final   Candida glabrata NOT DETECTED NOT DETECTED Final   Candida krusei NOT DETECTED NOT DETECTED Final   Candida parapsilosis NOT DETECTED NOT DETECTED Final   Candida tropicalis NOT DETECTED  NOT DETECTED Final    Comment: Performed at [facility], [address] [place], [place] [address]  Respiratory Panel by RT PCR (Flu A&B, Covid) - Nasopharyngeal Swab     Status: None   Collection Time: 03/24/20  7:10 PM   Specimen: Nasopharyngeal Swab  Result Value Ref Range Status   SARS Coronavirus 2 by RT PCR NEGATIVE NEGATIVE Final    Comment: (NOTE) SARS-CoV-2 target nucleic acids are NOT DETECTED. The SARS-CoV-2 RNA is generally detectable in upper respiratoy specimens during the acute phase of infection. The lowest concentration of SARS-CoV-2 viral copies this assay can detect is 131 copies/mL. A negative  result does not preclude SARS-Cov-2 infection and should not be used as the sole basis for treatment or other patient management decisions. A negative result may occur with  improper specimen collection/handling, submission of specimen other than nasopharyngeal swab, presence of viral mutation(s) within the areas targeted by this assay, and inadequate number of viral copies (<131 copies/mL). A negative result must be combined with clinical observations, patient history, and epidemiological information. The expected result is Negative. Fact Sheet for Patients:  PinkCheek.be Fact Sheet for Healthcare Providers:  GravelBags.it This test is not yet ap proved or cleared by the [place] FDA and  has been authorized for detection and/or diagnosis of SARS-CoV-2 by FDA under an Emergency Use Authorization (EUA). This EUA will remain  in effect (meaning this test can be used) for the duration of the COVID-19 declaration under Section 564(b)(1) of the Act, 21 U.S.C. section 360bbb-3(b)(1), unless the authorization is terminated or revoked sooner.    Influenza A by PCR NEGATIVE NEGATIVE Final   Influenza B by PCR NEGATIVE NEGATIVE Final    Comment: (NOTE) The Xpert Xpress SARS-CoV-2/FLU/RSV assay is intended as an  aid in  the diagnosis of influenza from Nasopharyngeal swab specimens and  should not be used as a sole basis for treatment. Nasal washings and  aspirates are unacceptable for Xpert Xpress SARS-CoV-2/FLU/RSV  testing. Fact Sheet for Patients: PinkCheek.be Fact Sheet for Healthcare Providers: GravelBags.it This test is not yet approved or cleared by the [place] FDA and  has been authorized for detection and/or diagnosis of SARS-CoV-2 by  FDA under an Emergency Use Authorization (EUA). This EUA will remain  in effect (meaning this test can be used) for the duration of the  Covid-19 declaration under Section 564(b)(1) of the Act, 21  U.S.C. section 360bbb-3(b)(1), unless the authorization is  terminated or revoked. Performed at [facility], [address], [place], [place] [address]          Radiology Studies: CT ANGIO CHEST PE W OR WO CONTRAST  Result Date: 03/26/2020 CLINICAL DATA:  Shortness of breath EXAM: CT ANGIOGRAPHY CHEST WITH CONTRAST TECHNIQUE: Multidetector CT imaging of the chest was performed using the standard protocol during bolus administration of intravenous contrast. Multiplanar CT image reconstructions and MIPs were obtained to evaluate the vascular anatomy. CONTRAST:  192mL OMNIPAQUE IOHEXOL 350 MG/ML SOLN COMPARISON:  Plain film from earlier in the same day, MRI from the previous day. FINDINGS: Cardiovascular: Thoracic aorta and its branches demonstrate atherosclerotic calcifications without aneurysmal dilatation. No dissection is seen. No cardiac enlargement is seen. Minimal pericardial fluid is noted. Coronary calcifications are seen. The pulmonary artery shows a normal branching pattern. No definitive filling defects to suggest pulmonary emboli are noted. Mediastinum/Nodes: Thoracic inlet is within normal limits. No hilar or mediastinal adenopathy is noted. The esophagus as visualized is within normal  limits. Lungs/Pleura: Considerable motion artifact is noted. The left lung is well aerated without focal infiltrate or sizable effusion. Small right-sided pleural effusion and right lower lobe atelectatic changes are seen. No focal confluent infiltrate is noted. No parenchymal nodules are seen. Upper Abdomen: Visualized upper abdomen again demonstrates some mild peripancreatic inflammatory change consistent with the known history of pancreatitis. Renal cysts are seen. Musculoskeletal: Degenerative changes of the thoracic spine are seen. Healing rib fractures are noted on the right involving the sixth through eighth ribs anteriorly. No definitive left rib fractures are seen. No compression deformities are seen. Review of the MIP images confirms the above findings. IMPRESSION: Multiple right-sided  rib fractures with healing. A small right effusion is noted which is likely compensatory in nature. Mild associated atelectasis is noted. No pneumothorax is seen. Mild changes of pancreatitis similar to that noted on prior MRI from the previous day. Aortic Atherosclerosis (ICD10-I70.0). Electronically Signed   By: Alcide Clever M.D.   On: 03/26/2020 20:51   DG CHEST PORT 1 VIEW  Result Date: 03/26/2020 CLINICAL DATA:  Short of breath, COPD, previous tobacco abuse, diabetes, hypertension EXAM: PORTABLE CHEST 1 VIEW COMPARISON:  03/26/2020 at 9:57 a.m. FINDINGS: Single frontal view of the chest demonstrates a stable cardiac silhouette. No airspace disease, effusion, or pneumothorax. Improved aeration at the lung bases. No acute bony abnormalities. IMPRESSION: 1. No acute process. Electronically Signed   By: Sharlet Salina M.D.   On: 03/26/2020 18:42   DG CHEST PORT 1 VIEW  Result Date: 03/26/2020 CLINICAL DATA:  Shortness of breath with cough EXAM: PORTABLE CHEST 1 VIEW COMPARISON:  03/24/2020 FINDINGS: Low volume chest with prominence of the cardiomediastinal silhouette. Mild atelectasis or scar like appearance at the  bases. There is no edema, consolidation, effusion, or pneumothorax. No acute osseous finding. IMPRESSION: Minimal scarring or atelectasis at the bases. Electronically Signed   By: Marnee Spring M.D.   On: 03/26/2020 10:09        Scheduled Meds: . fluticasone  2 spray Each Nare Daily  . heparin injection (subcutaneous)  5,000 Units Subcutaneous Q8H  . insulin aspart  0-9 Units Subcutaneous Q4H  . lidocaine  2 patch Transdermal Q24H  . metoprolol tartrate  25 mg Oral BID  . ondansetron  4 mg Oral Once  . sodium chloride flush  3 mL Intravenous Q12H  . tamsulosin  0.8 mg Oral QPC supper   Continuous Infusions: . cefTRIAXone (ROCEPHIN)  IV 2 g (03/26/20 1330)  . metronidazole 500 mg (03/27/20 0701)     LOS: 2 days   Time spent= 35 mins    Evander Macaraeg Joline Maxcy, MD [facility]  If 7PM-7AM, please contact night-coverage  03/27/2020, 8:10 AM

## 2020-03-28 ENCOUNTER — Inpatient Hospital Stay (HOSPITAL_COMMUNITY): Payer: Medicare Other

## 2020-03-28 ENCOUNTER — Ambulatory Visit (INDEPENDENT_AMBULATORY_CARE_PROVIDER_SITE_OTHER): Payer: Medicare Other | Admitting: Family Medicine

## 2020-03-28 DIAGNOSIS — Z5329 Procedure and treatment not carried out because of patient's decision for other reasons: Secondary | ICD-10-CM

## 2020-03-28 LAB — GLUCOSE, CAPILLARY
Glucose-Capillary: 120 mg/dL — ABNORMAL HIGH (ref 70–99)
Glucose-Capillary: 123 mg/dL — ABNORMAL HIGH (ref 70–99)
Glucose-Capillary: 131 mg/dL — ABNORMAL HIGH (ref 70–99)
Glucose-Capillary: 142 mg/dL — ABNORMAL HIGH (ref 70–99)
Glucose-Capillary: 148 mg/dL — ABNORMAL HIGH (ref 70–99)
Glucose-Capillary: 171 mg/dL — ABNORMAL HIGH (ref 70–99)
Glucose-Capillary: 184 mg/dL — ABNORMAL HIGH (ref 70–99)

## 2020-03-28 LAB — CBC
HCT: 42.9 % (ref 39.0–52.0)
Hemoglobin: 14.6 g/dL (ref 13.0–17.0)
MCH: 30 pg (ref 26.0–34.0)
MCHC: 34 g/dL (ref 30.0–36.0)
MCV: 88.1 fL (ref 80.0–100.0)
Platelets: 147 10*3/uL — ABNORMAL LOW (ref 150–400)
RBC: 4.87 MIL/uL (ref 4.22–5.81)
RDW: 13.5 % (ref 11.5–15.5)
WBC: 12.2 10*3/uL — ABNORMAL HIGH (ref 4.0–10.5)
nRBC: 0 % (ref 0.0–0.2)

## 2020-03-28 LAB — MAGNESIUM: Magnesium: 1.9 mg/dL (ref 1.7–2.4)

## 2020-03-28 LAB — COMPREHENSIVE METABOLIC PANEL
ALT: 81 U/L — ABNORMAL HIGH (ref 0–44)
AST: 28 U/L (ref 15–41)
Albumin: 2.4 g/dL — ABNORMAL LOW (ref 3.5–5.0)
Alkaline Phosphatase: 138 U/L — ABNORMAL HIGH (ref 38–126)
Anion gap: 12 (ref 5–15)
BUN: 13 mg/dL (ref 8–23)
CO2: 24 mmol/L (ref 22–32)
Calcium: 7.9 mg/dL — ABNORMAL LOW (ref 8.9–10.3)
Chloride: 97 mmol/L — ABNORMAL LOW (ref 98–111)
Creatinine, Ser: 0.89 mg/dL (ref 0.61–1.24)
GFR calc Af Amer: >60 mL/min (ref >60)
GFR calc non Af Amer: >60 mL/min (ref >60)
Glucose, Bld: 143 mg/dL — ABNORMAL HIGH (ref 70–99)
Potassium: 3.6 mmol/L (ref 3.5–5.1)
Sodium: 133 mmol/L — ABNORMAL LOW (ref 135–145)
Total Bilirubin: 1.8 mg/dL — ABNORMAL HIGH (ref 0.3–1.2)
Total Protein: 5.1 g/dL — ABNORMAL LOW (ref 6.5–8.1)

## 2020-03-28 LAB — LIPASE, BLOOD: Lipase: 76 U/L — ABNORMAL HIGH (ref 11–51)

## 2020-03-28 LAB — HEPARIN LEVEL (UNFRACTIONATED): Heparin Unfractionated: 0.3 IU/mL (ref 0.30–0.70)

## 2020-03-28 LAB — BRAIN NATRIURETIC PEPTIDE: B Natriuretic Peptide: 187.9 pg/mL — ABNORMAL HIGH (ref 0.0–100.0)

## 2020-03-28 IMAGING — DX DG CHEST 1V PORT
1 series · 1 of 1 positions shown · non-contrast
Comparison: CT chest and chest radiograph [DATE].

CLINICAL DATA: Shortness of breath, cough.

EXAM:
PORTABLE CHEST 1 VIEW

[chest ap]
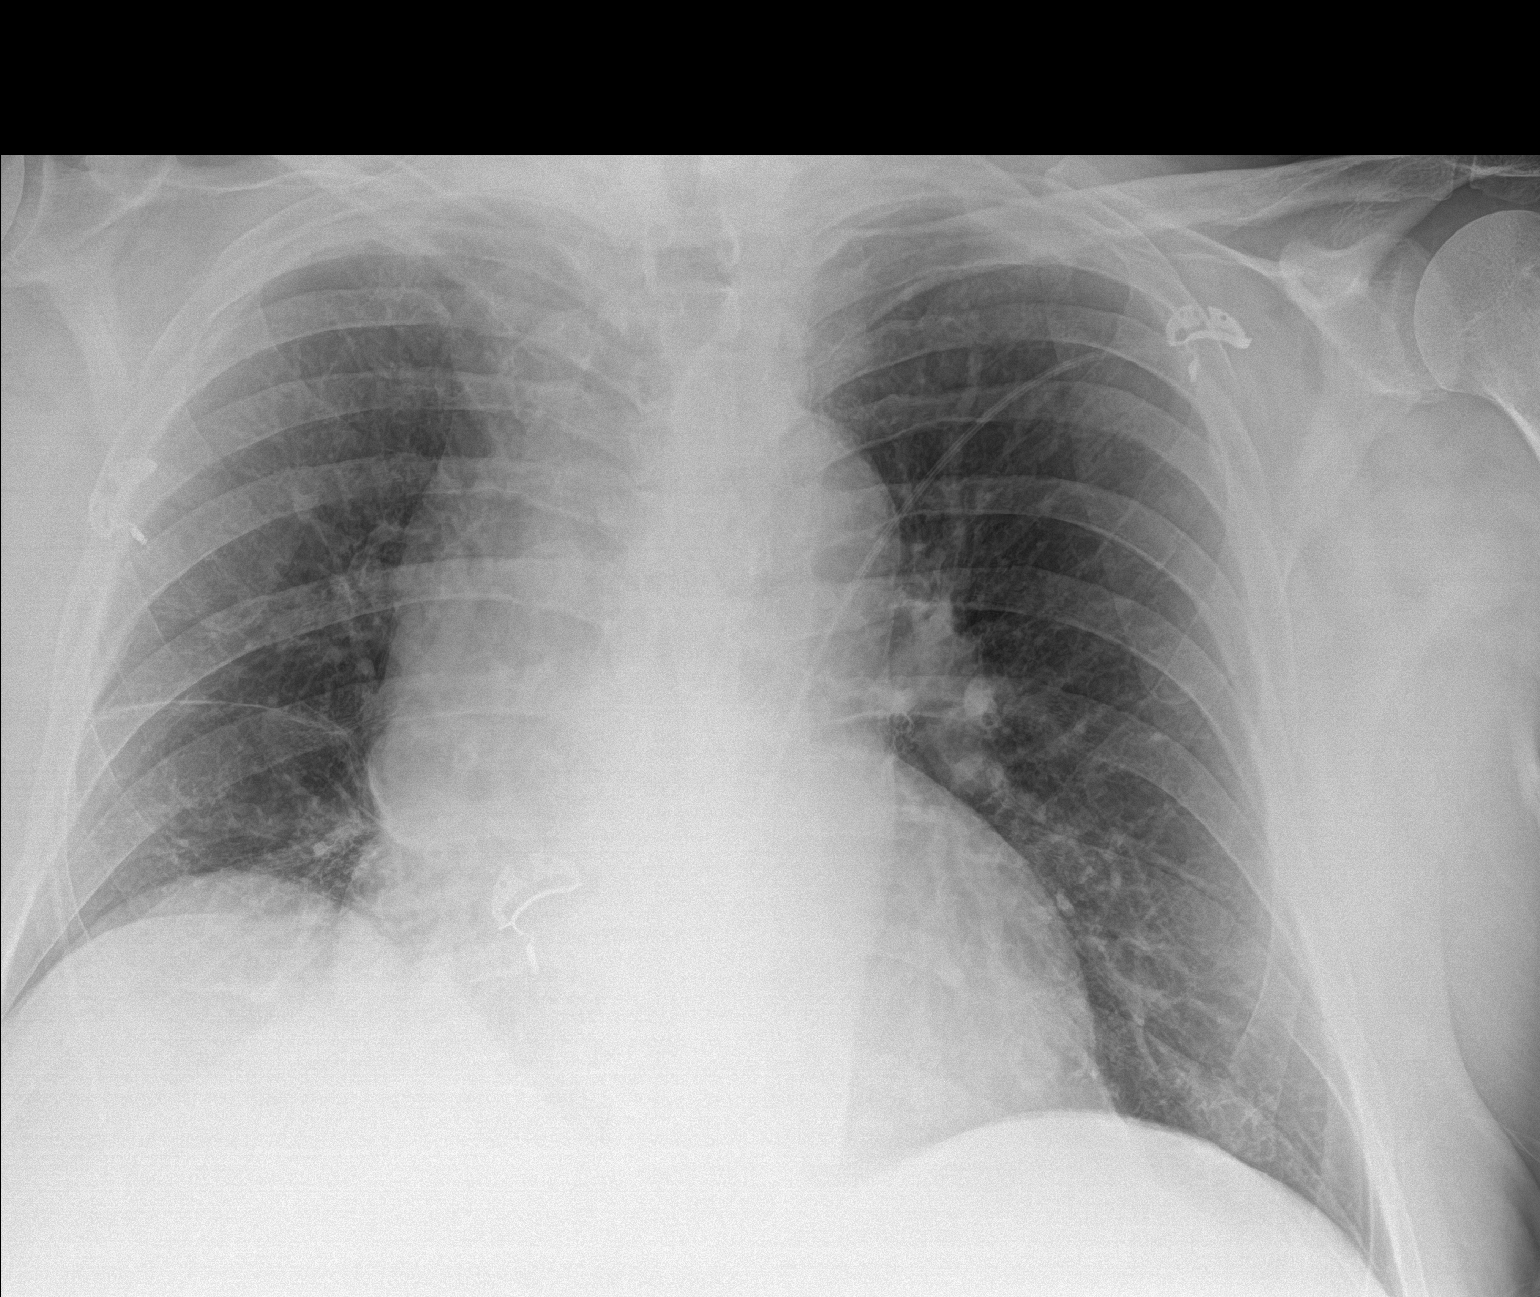

[1 of 1 positions shown; findings below may reference images not displayed]

FINDINGS: Trachea is midline. Heart is enlarged, stable. Minimal right basilar
atelectasis or scarring. Thickening of the minor fissure. Lungs are
otherwise clear. No pleural fluid.
IMPRESSION: No acute findings.

## 2020-03-28 MED ORDER — HEPARIN (PORCINE) 25000 UT/250ML-% IV SOLN
1350.0000 [IU]/h | INTRAVENOUS | Status: DC
  Administered 2020-03-28: 1300 [IU]/h via INTRAVENOUS
  Administered 2020-03-29: 1350 [IU]/h via INTRAVENOUS
  Filled 2020-03-28 (×2): qty 250

## 2020-03-28 MED ORDER — LEVALBUTEROL HCL 0.63 MG/3ML IN NEBU
0.6300 mg | INHALATION_SOLUTION | Freq: Three times a day (TID) | RESPIRATORY_TRACT | Status: DC
  Administered 2020-03-28 – 2020-03-30 (×5): 0.63 mg via RESPIRATORY_TRACT
  Filled 2020-03-28 (×5): qty 3

## 2020-03-28 MED ORDER — IPRATROPIUM BROMIDE 0.02 % IN SOLN
0.5000 mg | Freq: Three times a day (TID) | RESPIRATORY_TRACT | Status: DC
  Administered 2020-03-28 – 2020-03-30 (×5): 0.5 mg via RESPIRATORY_TRACT
  Filled 2020-03-28 (×5): qty 2.5

## 2020-03-28 MED ORDER — AMIODARONE LOAD VIA INFUSION
150.0000 mg | Freq: Once | INTRAVENOUS | Status: AC
  Administered 2020-03-28: 150 mg via INTRAVENOUS
  Filled 2020-03-28: qty 83.34

## 2020-03-28 MED ORDER — LEVALBUTEROL HCL 0.63 MG/3ML IN NEBU: 0.6300 mg | INHALATION_SOLUTION | Freq: Three times a day (TID) | RESPIRATORY_TRACT | Status: DC

## 2020-03-28 MED ORDER — IPRATROPIUM BROMIDE 0.02 % IN SOLN: 0.5000 mg | Freq: Three times a day (TID) | RESPIRATORY_TRACT | Status: DC

## 2020-03-28 MED ORDER — BENZONATATE 100 MG PO CAPS
100.0000 mg | ORAL_CAPSULE | Freq: Three times a day (TID) | ORAL | Status: DC | PRN
  Administered 2020-03-28 – 2020-03-29 (×4): 100 mg via ORAL
  Filled 2020-03-28 (×3): qty 1

## 2020-03-28 MED ORDER — AMIODARONE HCL IN DEXTROSE 360-4.14 MG/200ML-% IV SOLN
30.0000 mg/h | INTRAVENOUS | Status: DC
  Administered 2020-03-28 – 2020-03-29 (×4): 30 mg/h via INTRAVENOUS
  Filled 2020-03-28 (×4): qty 200

## 2020-03-28 MED ORDER — HEPARIN BOLUS VIA INFUSION
4000.0000 [IU] | Freq: Once | INTRAVENOUS | Status: AC
  Administered 2020-03-28: 12:00:00 4000 [IU] via INTRAVENOUS
  Filled 2020-03-28: qty 4000

## 2020-03-28 MED ORDER — FUROSEMIDE 10 MG/ML IJ SOLN
40.0000 mg | Freq: Once | INTRAMUSCULAR | Status: AC
  Administered 2020-03-28: 40 mg via INTRAVENOUS

## 2020-03-28 MED ORDER — AMIODARONE HCL IN DEXTROSE 360-4.14 MG/200ML-% IV SOLN
60.0000 mg/h | INTRAVENOUS | Status: AC
  Administered 2020-03-28 (×2): 60 mg/h via INTRAVENOUS
  Filled 2020-03-28: qty 200

## 2020-03-28 NOTE — Progress Notes (Signed)
PROGRESS NOTE    Vincent Collins  GHW:[id number] DOB: [birth year] DOA: 03/24/2020 PCP: Dettinger, Fransisca Kaufmann, MD   Brief Narrative:  79 year old with history of COPD, DM 2, HTN, HLD, left-sided hydronephrosis, diverticulitis currently admitted for acute pancreatitis and sepsis secondary to E. coli bacteremia with possible acute cholangitis.  Hospital course complicated by atrial fibrillation with RVR.   Assessment & Plan:   Principal Problem:   Cholangitis Active Problems:   Essential hypertension   COPD (chronic obstructive pulmonary disease) (HCC)   Type 2 diabetes mellitus (HCC)   Acute pancreatitis, unclear etiology Acalculus cholecystitis -Conservative management at this time.  Empirically on Unasyn. -General surgery and GI team following. -MRCP showed acute necrotizing pancreatitis, cholecystitis without stones -Advance diet-full liquid  Atrial fibrillation with RVR -Spontaneously resolved.  Continue metoprolol 25 mg twice daily.  Plans to start amiodarone and heparin drip. -Replete electrolytes as appropriate -Echocardiogram-EF 65-70%.  E. coli bacteremia -Pansensitive.  Continue Unasyn  Mild to moderate shortness of breath with orthopnea and cough Combination of atrial fibrillation with RVR and possible mild fluid overload?Marland Kitchen  Overall clear to auscultation bilaterally, chest x-ray negative.  He has been getting IV fluids, will give 1 dose of trial of IV Lasix.  Essential hypertension, uncontrolled -Resume lisinopril 20 mg daily  Hyperlipidemia -On hold due to elevated LFTs  DM2 -Sliding scale and Accu-Cheks.  BPH -Flomax  DVT prophylaxis: Subcu heparin Code Status: Full code Family Communication: None Disposition Plan:   Patient From= home  Patient Anticipated D/C place= Home  Barriers= maintain hospital stay for evaluation of atrial fibrillation with RVR.  Still slowly advancing his diet.   Subjective: Tells me he feels short of breath this morning  especially with minimal exertion and when he lays flat.  Remains in atrial fibrillation with RVR.  Tolerated clear liquid diet  Review of Systems Otherwise negative except as per HPI, including: General = no fevers, chills, dizziness,  fatigue HEENT/EYES = negative for loss of vision, double vision, blurred vision,  sore throa Cardiovascular= negative for chest pain, palpitation Respiratory/lungs= negative for   hemoptysis,  Gastrointestinal= negative for nausea, vomiting, abdominal pain Genitourinary= negative for Dysuria MSK = Negative for arthralgia, myalgias Neurology= Negative for headache, numbness, tingling  Psychiatry= Negative for suicidal and homocidal ideation Skin= Negative for Rash  Examination:  Constitutional: Not in acute distress Respiratory: Very minimal bibasilar crackles Cardiovascular: Irregularly irregular Abdomen: Nontender nondistended good bowel sounds Musculoskeletal: No edema noted Skin: No rashes seen Neurologic: CN 2-12 grossly intact.  And nonfocal Psychiatric: Normal judgment and insight. Alert and oriented x 3. Normal mood.     Objective: Vitals:   03/28/20 0544 03/28/20 0637 03/28/20 0640 03/28/20 0756  BP: (!) 152/85   (!) 142/75  Pulse: 95   96  Resp: (!) 22 (!) 21 20 20   Temp: 98.2 F (36.8 C)   98.1 F (36.7 C)  TempSrc: Oral   Oral  SpO2: 94%   92%  Weight:      Height:        Intake/Output Summary (Last 24 hours) at 03/28/2020 1230 Last data filed at 03/27/2020 2200 Gross per 24 hour  Intake 715.56 ml  Output 150 ml  Net 565.56 ml   Filed Weights   03/26/20 1727 03/27/20 0505 03/28/20 0418  Weight: 111.4 kg 110 kg 111.1 kg     Data Reviewed:   CBC: Recent Labs  Lab 03/24/20 1820 03/25/20 0420 03/26/20 0256 03/27/20 0527 03/28/20 0344  WBC 7.7 20.4* 16.7*  14.4* 12.2*  NEUTROABS 7.1 18.7* 14.7* 12.5*  --   HGB 16.1 15.0 14.4 14.7 14.6  HCT 47.7 44.7 42.2 42.9 42.9  MCV 90.0 89.0 88.3 86.7 88.1  PLT 178 161 137*  149* 147*   Basic Metabolic Panel: Recent Labs  Lab 03/24/20 1820 03/25/20 0420 03/26/20 0256 03/27/20 0527 03/28/20 0344  NA 136 138 139 138 133*  K 3.6 3.3* 3.8 3.4* 3.6  CL 104 104 105 99 97*  CO2 20* 22 21* 24 24  GLUCOSE 226* 178* 147* 122* 143*  BUN CREATININE 1.07 1.49* 1.13 0.94 0.89  CALCIUM 8.3* 8.1* 7.8* 8.2* 7.9*  MG  --  1.2* 2.2 1.9 1.9  PHOS  --   --  2.5 2.2*  --    GFR: Estimated Creatinine Clearance: 84.1 mL/min (by C-G formula based on SCr of 0.89 mg/dL). Liver Function Tests: Recent Labs  Lab 03/24/20 1820 03/25/20 0420 03/26/20 0256 03/27/20 0527 03/28/20 0344  AST 352* 266* 120* 54* 28  ALT 255* 258* 184* 126* 81*  ALKPHOS 195* 145* 145* 169* 138*  BILITOT 4.2* 5.1* 6.5* 3.5* 1.8*  PROT 6.6 5.3* 5.0* 5.6* 5.1*  ALBUMIN 3.5 2.7* 2.6*  2.6* 2.6* 2.4*   Recent Labs  Lab 03/25/20 0140 03/26/20 0256 03/27/20 0527 03/28/20 0344  LIPASE 1,476* 571* 154* 76*   No results for input(s): AMMONIA in the last 168 hours. Coagulation Profile: Recent Labs  Lab 03/24/20 1820 03/25/20 0420  INR 1.0 1.1   Cardiac Enzymes: No results for input(s): CKTOTAL, CKMB, CKMBINDEX, TROPONINI in the last 168 hours. BNP (last 3 results) No results for input(s): PROBNP in the last 8760 hours. HbA1C: No results for input(s): HGBA1C in the last 72 hours. CBG: Recent Labs  Lab 03/27/20 1957 03/28/20 0001 03/28/20 0353 03/28/20 0755 03/28/20 1109  GLUCAP 158* 123* 142* 171* 131*   Lipid Profile: No results for input(s): CHOL, HDL, LDLCALC, TRIG, CHOLHDL, LDLDIRECT in the last 72 hours. Thyroid Function Tests: No results for input(s): TSH, T4TOTAL, FREET4, T3FREE, THYROIDAB in the last 72 hours. Anemia Panel: No results for input(s): VITAMINB12, FOLATE, FERRITIN, TIBC, IRON, RETICCTPCT in the last 72 hours. Sepsis Labs: Recent Labs  Lab 03/25/20 0140 03/25/20 0359 03/25/20 0838 03/25/20 1144  LATICACIDVEN 2.1* 3.0* 2.0* 1.6     Recent Results (from the past 240 hour(s))  Culture, blood (Routine x 2)     Status: Abnormal   Collection Time: 03/24/20  6:20 PM   Specimen: Left Antecubital; Blood  Result Value Ref Range Status   Specimen Description   Final    LEFT ANTECUBITAL Performed at [facility], [address] [place], [place] [address]    Special Requests   Final    BOTTLES DRAWN AEROBIC AND ANAEROBIC Blood Culture adequate volume Performed at [facility], [address], [place], [place] [address]    Culture  Setup Time   Final    GRAM NEGATIVE RODS IN BOTH AEROBIC AND ANAEROBIC BOTTLES Gram Stain Report Called to,Read Back By and Verified With: BAINE C. @ [place] @ 0852 ON [id number] BY HENDERSON L CRITICAL RESULT CALLED TO, READ BACK BY AND VERIFIED WITH: D. PIERCE, PHARMD AT 1354 ON 03/25/20 BY C. JESSUP, MT.    Culture (A)  Final    ESCHERICHIA COLI SUSCEPTIBILITIES PERFORMED ON PREVIOUS CULTURE WITHIN THE LAST 5 DAYS. Performed at [facility], [address], [place], [place] [address]    Report Status  03/27/2020 FINAL  Final  Culture, blood (Routine x 2)     Status: Abnormal   Collection Time: 03/24/20  7:01 PM   Specimen: Left Antecubital; Blood  Result Value Ref Range Status   Specimen Description   Final    LEFT ANTECUBITAL Performed at [facility], [address], [place], [place] [address]    Special Requests   Final    BOTTLES DRAWN AEROBIC AND ANAEROBIC Blood Culture adequate volume Performed at [facility], [address] [place], [place] [address]    Culture  Setup Time   Final    GRAM NEGATIVE RODS IN BOTH AEROBIC AND ANAEROBIC BOTTLES Gram Stain Report Called to,Read Back By and Verified With: BAINE C. @ [place] @ 0852 ON [id number] BY HENDERSON L CRITICAL RESULT CALLED TO, READ BACK BY AND VERIFIED WITH: D. PIERCE, PHARMD AT 1354 ON 03/25/20 BY C. JESSUP, MT. Performed at [facility], [address] [place], [place] [address]    Culture ESCHERICHIA COLI (A)   Final   Report Status 03/27/2020 FINAL  Final   Organism ID, Bacteria ESCHERICHIA COLI  Final      Susceptibility   Escherichia coli - MIC*    AMPICILLIN 4 SENSITIVE Sensitive     CEFAZOLIN <=4 SENSITIVE Sensitive     CEFEPIME <=0.12 SENSITIVE Sensitive     CEFTAZIDIME <=1 SENSITIVE Sensitive     CEFTRIAXONE <=0.25 SENSITIVE Sensitive     CIPROFLOXACIN <=0.25 SENSITIVE Sensitive     GENTAMICIN <=1 SENSITIVE Sensitive     IMIPENEM <=0.25 SENSITIVE Sensitive     TRIMETH/SULFA <=20 SENSITIVE Sensitive     AMPICILLIN/SULBACTAM <=2 SENSITIVE Sensitive     PIP/TAZO <=4 SENSITIVE Sensitive     * ESCHERICHIA COLI  Blood Culture ID Panel (Reflexed)     Status: Abnormal   Collection Time: 03/24/20  7:01 PM  Result Value Ref Range Status   Enterococcus species NOT DETECTED NOT DETECTED Final   Listeria monocytogenes NOT DETECTED NOT DETECTED Final   Staphylococcus species NOT DETECTED NOT DETECTED Final   Staphylococcus aureus (BCID) NOT DETECTED NOT DETECTED Final   Streptococcus species NOT DETECTED NOT DETECTED Final   Streptococcus agalactiae NOT DETECTED NOT DETECTED Final   Streptococcus pneumoniae NOT DETECTED NOT DETECTED Final   Streptococcus pyogenes NOT DETECTED NOT DETECTED Final   Acinetobacter baumannii NOT DETECTED NOT DETECTED Final   Enterobacteriaceae species DETECTED (A) NOT DETECTED Final    Comment: Enterobacteriaceae represent a large family of gram-negative bacteria, not a single organism. CRITICAL RESULT CALLED TO, READ BACK BY AND VERIFIED WITH: D. PIERCE, PHARMD AT 1354 ON 03/25/20 BY C. JESSUP, MT.    Enterobacter cloacae complex NOT DETECTED NOT DETECTED Final   Escherichia coli DETECTED (A) NOT DETECTED Final    Comment: CRITICAL RESULT CALLED TO, READ BACK BY AND VERIFIED WITH: D. PIERCE, PHARMD AT 1354 ON 03/25/20 BY C. JESSUP, MT.    Klebsiella oxytoca NOT DETECTED NOT DETECTED Final   Klebsiella pneumoniae NOT DETECTED NOT DETECTED Final   Proteus species  NOT DETECTED NOT DETECTED Final   Serratia marcescens NOT DETECTED NOT DETECTED Final   Carbapenem resistance NOT DETECTED NOT DETECTED Final   Haemophilus influenzae NOT DETECTED NOT DETECTED Final   Neisseria meningitidis NOT DETECTED NOT DETECTED Final   Pseudomonas aeruginosa NOT DETECTED NOT DETECTED Final   Candida albicans NOT DETECTED NOT DETECTED Final   Candida glabrata NOT DETECTED NOT DETECTED Final   Candida krusei NOT DETECTED NOT DETECTED  Final   Candida parapsilosis NOT DETECTED NOT DETECTED Final   Candida tropicalis NOT DETECTED NOT DETECTED Final    Comment: Performed at [facility], [address] [place], [place] [address]  Respiratory Panel by RT PCR (Flu A&B, Covid) - Nasopharyngeal Swab     Status: None   Collection Time: 03/24/20  7:10 PM   Specimen: Nasopharyngeal Swab  Result Value Ref Range Status   SARS Coronavirus 2 by RT PCR NEGATIVE NEGATIVE Final    Comment: (NOTE) SARS-CoV-2 target nucleic acids are NOT DETECTED. The SARS-CoV-2 RNA is generally detectable in upper respiratoy specimens during the acute phase of infection. The lowest concentration of SARS-CoV-2 viral copies this assay can detect is 131 copies/mL. A negative result does not preclude SARS-Cov-2 infection and should not be used as the sole basis for treatment or other patient management decisions. A negative result may occur with  improper specimen collection/handling, submission of specimen other than nasopharyngeal swab, presence of viral mutation(s) within the areas targeted by this assay, and inadequate number of viral copies (<131 copies/mL). A negative result must be combined with clinical observations, patient history, and epidemiological information. The expected result is Negative. Fact Sheet for Patients:  https://www.moore.com/ Fact Sheet for Healthcare Providers:  https://www.young.biz/ This test is not yet ap proved or cleared  by the [place] FDA and  has been authorized for detection and/or diagnosis of SARS-CoV-2 by FDA under an Emergency Use Authorization (EUA). This EUA will remain  in effect (meaning this test can be used) for the duration of the COVID-19 declaration under Section 564(b)(1) of the Act, 21 U.S.C. section 360bbb-3(b)(1), unless the authorization is terminated or revoked sooner.    Influenza A by PCR NEGATIVE NEGATIVE Final   Influenza B by PCR NEGATIVE NEGATIVE Final    Comment: (NOTE) The Xpert Xpress SARS-CoV-2/FLU/RSV assay is intended as an aid in  the diagnosis of influenza from Nasopharyngeal swab specimens and  should not be used as a sole basis for treatment. Nasal washings and  aspirates are unacceptable for Xpert Xpress SARS-CoV-2/FLU/RSV  testing. Fact Sheet for Patients: https://www.moore.com/ Fact Sheet for Healthcare Providers: https://www.young.biz/ This test is not yet approved or cleared by the [place] FDA and  has been authorized for detection and/or diagnosis of SARS-CoV-2 by  FDA under an Emergency Use Authorization (EUA). This EUA will remain  in effect (meaning this test can be used) for the duration of the  Covid-19 declaration under Section 564(b)(1) of the Act, 21  U.S.C. section 360bbb-3(b)(1), unless the authorization is  terminated or revoked. Performed at [facility], [address], [place], [place] [address]          Radiology Studies: CT ANGIO CHEST PE W OR WO CONTRAST  Result Date: 03/26/2020 CLINICAL DATA:  Shortness of breath EXAM: CT ANGIOGRAPHY CHEST WITH CONTRAST TECHNIQUE: Multidetector CT imaging of the chest was performed using the standard protocol during bolus administration of intravenous contrast. Multiplanar CT image reconstructions and MIPs were obtained to evaluate the vascular anatomy. CONTRAST:  OMNIPAQUE IOHEXOL 350 MG/ML SOLN COMPARISON:  Plain film from earlier in the same  day, MRI from the previous day. FINDINGS: Cardiovascular: Thoracic aorta and its branches demonstrate atherosclerotic calcifications without aneurysmal dilatation. No dissection is seen. No cardiac enlargement is seen. Minimal pericardial fluid is noted. Coronary calcifications are seen. The pulmonary artery shows a normal branching pattern. No definitive filling defects to suggest pulmonary emboli are noted. Mediastinum/Nodes: Thoracic inlet is within normal  limits. No hilar or mediastinal adenopathy is noted. The esophagus as visualized is within normal limits. Lungs/Pleura: Considerable motion artifact is noted. The left lung is well aerated without focal infiltrate or sizable effusion. Small right-sided pleural effusion and right lower lobe atelectatic changes are seen. No focal confluent infiltrate is noted. No parenchymal nodules are seen. Upper Abdomen: Visualized upper abdomen again demonstrates some mild peripancreatic inflammatory change consistent with the known history of pancreatitis. Renal cysts are seen. Musculoskeletal: Degenerative changes of the thoracic spine are seen. Healing rib fractures are noted on the right involving the sixth through eighth ribs anteriorly. No definitive left rib fractures are seen. No compression deformities are seen. Review of the MIP images confirms the above findings. IMPRESSION: Multiple right-sided rib fractures with healing. A small right effusion is noted which is likely compensatory in nature. Mild associated atelectasis is noted. No pneumothorax is seen. Mild changes of pancreatitis similar to that noted on prior MRI from the previous day. Aortic Atherosclerosis (ICD10-I70.0). Electronically Signed   By: Alcide Clever M.D.   On: 03/26/2020 20:51   DG Chest Port 1 View  Result Date: 03/28/2020 CLINICAL DATA:  Shortness of breath, cough. EXAM: PORTABLE CHEST 1 VIEW COMPARISON:  CT chest and chest radiograph 03/26/2020. FINDINGS: Trachea is midline. Heart is  enlarged, stable. Minimal right basilar atelectasis or scarring. Thickening of the minor fissure. Lungs are otherwise clear. No pleural fluid. IMPRESSION: No acute findings. Electronically Signed   By: Leanna Battles M.D.   On: 03/28/2020 10:26   DG CHEST PORT 1 VIEW  Result Date: 03/26/2020 CLINICAL DATA:  Short of breath, COPD, previous tobacco abuse, diabetes, hypertension EXAM: PORTABLE CHEST 1 VIEW COMPARISON:  03/26/2020 at 9:57 a.m. FINDINGS: Single frontal view of the chest demonstrates a stable cardiac silhouette. No airspace disease, effusion, or pneumothorax. Improved aeration at the lung bases. No acute bony abnormalities. IMPRESSION: 1. No acute process. Electronically Signed   By: Sharlet Salina M.D.   On: 03/26/2020 18:42   ECHOCARDIOGRAM COMPLETE  Result Date: 03/27/2020    ECHOCARDIOGRAM REPORT   Patient Name:   Vincent Collins Date of Exam: 03/27/2020 Medical Rec #:  [id number]     Height:       68.0 in Accession #:    [id number]    Weight:       242.5 lb Date of Birth:  Nov 01, 1941     BSA:          2.218 m Patient Age:    78 years      BP:           157/76 mmHg Patient Gender: M             HR:           105 bpm. Exam Location:  Inpatient Procedure: 2D Echo, Color Doppler and Cardiac Doppler Indications:    I48.91* Unspecified atrial fibrillation  History:        Patient has prior history of Echocardiogram examinations, most                 recent 04/04/2009. COPD; Risk Factors:Hypertension, Diabetes and                 Dyslipidemia.  Sonographer:    Irving Burton Senior RDCS Referring Phys: [id number] SYLVESTER I OGBATA  Sonographer Comments: Technically difficult study due to patient body habitus and respiratory variation. IMPRESSIONS  1. Left ventricular ejection fraction, by estimation, is 65 to 70%. The left ventricle has  normal function. The left ventricle has no regional wall motion abnormalities. There is moderate left ventricular hypertrophy. Left ventricular diastolic function  could not be  evaluated.  2. Right ventricular systolic function is normal. The right ventricular size is normal.  3. Left atrial size was severely dilated.  4. The mitral valve was not well visualized. No evidence of mitral valve regurgitation.  5. The aortic valve was not well visualized. Aortic valve regurgitation is not visualized.  6. Aortic dilatation noted. 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  7. The inferior vena cava is normal in size with greater than 50% respiratory variability, suggesting right atrial pressure of 3 mmHg. FINDINGS  Left Ventricle: Left ventricular ejection fraction, by estimation, is 65 to 70%. The left ventricle has normal function. The left ventricle has no regional wall motion abnormalities. The left ventricular internal cavity size was normal in size. There is  moderate left ventricular hypertrophy. Left ventricular diastolic function could not be evaluated due to atrial fibrillation. Left ventricular diastolic function could not be evaluated. Right Ventricle: The right ventricular size is normal. No increase in right ventricular wall thickness. Right ventricular systolic function is normal. Left Atrium: Left atrial size was severely dilated. Right Atrium: Right atrial size was normal in size. Pericardium: There is no evidence of pericardial effusion. Mitral Valve: The mitral valve was not well visualized. No evidence of mitral valve regurgitation. Tricuspid Valve: The tricuspid valve is not well visualized. Tricuspid valve regurgitation is trivial. Aortic Valve: The aortic valve was not well visualized. Aortic valve regurgitation is not visualized. Pulmonic Valve: The pulmonic valve was not well visualized. Pulmonic valve regurgitation is not visualized. Aorta: Aortic dilatation noted. 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 Venous: The inferior vena cava is normal in size with greater than 50% respiratory variability, suggesting right  atrial pressure of 3 mmHg. IAS/Shunts: No atrial level shunt detected by color flow Doppler.  LEFT VENTRICLE PLAX 2D LVIDd:         5.50 cm LVIDs:         3.40 cm LV PW:         1.20 cm LV IVS:        1.60 cm LVOT diam:     2.30 cm LV SV:         83 LV SV Index:   37 LVOT Area:     4.15 cm  RIGHT VENTRICLE RV S prime:     22.80 cm/s TAPSE (M-mode): 2.5 cm LEFT ATRIUM              Index       RIGHT ATRIUM           Index LA diam:        3.90 cm  1.76 cm/m  RA Area:     20.80 cm LA Vol (A2C):   100.3 ml 45.22 ml/m RA Volume:   56.80 ml  25.61 ml/m LA Vol (A4C):   118.0 ml 53.20 ml/m LA Biplane Vol: 120.0 ml 54.10 ml/m  AORTIC VALVE LVOT Vmax:   97.50 cm/s LVOT Vmean:  71.000 cm/s LVOT VTI:    0.199 m                          PULMONARY ARTERY AORTA                    MPA diam:  3.10 cm Ao Root diam: 3.90 cm Ao Asc diam:  4.40 cm  SHUNTS Systemic VTI:  0.20 m Systemic Diam: 2.30 cm Zoila ShutterKenneth Hilty MD Electronically signed by Zoila ShutterKenneth Hilty MD Signature Date/Time: 03/27/2020/3:35:16 PM    Final         Scheduled Meds: . fluticasone  2 spray Each Nare Daily  . insulin aspart  0-9 Units Subcutaneous Q4H  . lidocaine  2 patch Transdermal Q24H  . lisinopril  20 mg Oral Daily  . metoprolol tartrate  25 mg Oral BID  . ondansetron  4 mg Oral Once  . pantoprazole (PROTONIX) IV  40 mg Intravenous Q12H  . sodium chloride flush  3 mL Intravenous Q12H  . tamsulosin  0.8 mg Oral QPC supper   Continuous Infusions: . amiodarone 60 mg/hr (03/28/20 1223)   Followed by  . amiodarone    . ampicillin-sulbactam (UNASYN) IV 3 g (03/28/20 1215)  . heparin 1,300 Units/hr (03/28/20 1222)     LOS: 3 days   Time spent= 35 mins    Mirayah Wren Joline Maxcyhirag Mahoganie Basher, MD [facility]  If 7PM-7AM, please contact night-coverage  03/28/2020, 12:30 PM

## 2020-03-28 NOTE — Plan of Care (Signed)

## 2020-03-28 NOTE — Progress Notes (Addendum)
Subjective: CC: Patient sob this morning. Audible wheezing. He notes cough that has been keeping him up. No chest pain. Still having some peri-umbilical and epigastric pain that he rates as a 4/10. Tolerating cld without n/v or increased abdominal pain.   Objective: Vital signs in last 24 hours: Temp:  [98.1 F (36.7 C)-98.7 F (37.1 C)] 98.1 F (36.7 C) (03/31 0756) Pulse Rate:  [79-96] 96 (03/31 0756) Resp:  [16-26] 20 (03/31 0756) BP: (139-164)/(74-85) 142/75 (03/31 0756) SpO2:  [89 %-96 %] 92 % (03/31 0756) Weight:  [111.1 kg] 111.1 kg (03/31 0418) Last BM Date: 03/25/20  Intake/Output from previous day: 03/30 0701 - 03/31 0700 In: 715.6 [P.O.:580; IV Piggyback:135.6] Out: 150 [Urine:150] Intake/Output this shift: No intake/output data recorded.  PE: Gen: Alert, NAD, pleasant Card: Reg rate Pulm:Tachypneic, expiratory wheezing EVO:JJKKX, soft,mild epigastric TTP without rebound or guarding, +BS, no HSM. Small umbilical hernia.  Skin: no rashes noted, warm and dry  Lab Results:  Recent Labs    03/27/20 0527 03/28/20 0344  WBC 14.4* 12.2*  HGB 14.7 14.6  HCT 42.9 42.9  PLT 149* 147*   BMET Recent Labs    03/27/20 0527 03/28/20 0344  NA 138 133*  K 3.4* 3.6  CL 99 97*  CO2 24 24  GLUCOSE 122* 143*  BUN 13 13  CREATININE 0.94 0.89  CALCIUM 8.2* 7.9*   PT/INR No results for input(s): LABPROT, INR in the last 72 hours. CMP     Component Value Date/Time   NA 133 (L) 03/28/2020 0344   NA 141 01/04/2019 1117   K 3.6 03/28/2020 0344   CL 97 (L) 03/28/2020 0344   CO2 24 03/28/2020 0344   GLUCOSE 143 (H) 03/28/2020 0344   BUN 13 03/28/2020 0344   BUN 19 01/04/2019 1117   CREATININE 0.89 03/28/2020 0344   CALCIUM 7.9 (L) 03/28/2020 0344   PROT 5.1 (L) 03/28/2020 0344   PROT 6.2 01/04/2019 1117   ALBUMIN 2.4 (L) 03/28/2020 0344   ALBUMIN 4.1 01/04/2019 1117   AST 28 03/28/2020 0344   ALT 81 (H) 03/28/2020 0344   ALKPHOS 138 (H)  03/28/2020 0344   BILITOT 1.8 (H) 03/28/2020 0344   BILITOT 1.0 01/04/2019 1117   GFRNONAA >60 03/28/2020 0344   GFRAA >60 03/28/2020 0344   Lipase     Component Value Date/Time   LIPASE 154 (H) 03/27/2020 0527       Studies/Results: CT ANGIO CHEST PE W OR WO CONTRAST  Result Date: 03/26/2020 CLINICAL DATA:  Shortness of breath EXAM: CT ANGIOGRAPHY CHEST WITH CONTRAST TECHNIQUE: Multidetector CT imaging of the chest was performed using the standard protocol during bolus administration of intravenous contrast. Multiplanar CT image reconstructions and MIPs were obtained to evaluate the vascular anatomy. CONTRAST:  OMNIPAQUE IOHEXOL 350 MG/ML SOLN COMPARISON:  Plain film from earlier in the same day, MRI from the previous day. FINDINGS: Cardiovascular: Thoracic aorta and its branches demonstrate atherosclerotic calcifications without aneurysmal dilatation. No dissection is seen. No cardiac enlargement is seen. Minimal pericardial fluid is noted. Coronary calcifications are seen. The pulmonary artery shows a normal branching pattern. No definitive filling defects to suggest pulmonary emboli are noted. Mediastinum/Nodes: Thoracic inlet is within normal limits. No hilar or mediastinal adenopathy is noted. The esophagus as visualized is within normal limits. Lungs/Pleura: Considerable motion artifact is noted. The left lung is well aerated without focal infiltrate or sizable effusion. Small right-sided pleural effusion and right lower lobe atelectatic  changes are seen. No focal confluent infiltrate is noted. No parenchymal nodules are seen. Upper Abdomen: Visualized upper abdomen again demonstrates some mild peripancreatic inflammatory change consistent with the known history of pancreatitis. Renal cysts are seen. Musculoskeletal: Degenerative changes of the thoracic spine are seen. Healing rib fractures are noted on the right involving the sixth through eighth ribs anteriorly. No definitive left  rib fractures are seen. No compression deformities are seen. Review of the MIP images confirms the above findings. IMPRESSION: Multiple right-sided rib fractures with healing. A small right effusion is noted which is likely compensatory in nature. Mild associated atelectasis is noted. No pneumothorax is seen. Mild changes of pancreatitis similar to that noted on prior MRI from the previous day. Aortic Atherosclerosis (ICD10-I70.0). Electronically Signed   By: Inez Catalina M.D.   On: 03/26/2020 20:51   DG CHEST PORT 1 VIEW  Result Date: 03/26/2020 CLINICAL DATA:  Short of breath, COPD, previous tobacco abuse, diabetes, hypertension EXAM: PORTABLE CHEST 1 VIEW COMPARISON:  03/26/2020 at 9:57 a.m. FINDINGS: Single frontal view of the chest demonstrates a stable cardiac silhouette. No airspace disease, effusion, or pneumothorax. Improved aeration at the lung bases. No acute bony abnormalities. IMPRESSION: 1. No acute process. Electronically Signed   By: Randa Ngo M.D.   On: 03/26/2020 18:42   DG CHEST PORT 1 VIEW  Result Date: 03/26/2020 CLINICAL DATA:  Shortness of breath with cough EXAM: PORTABLE CHEST 1 VIEW COMPARISON:  03/24/2020 FINDINGS: Low volume chest with prominence of the cardiomediastinal silhouette. Mild atelectasis or scar like appearance at the bases. There is no edema, consolidation, effusion, or pneumothorax. No acute osseous finding. IMPRESSION: Minimal scarring or atelectasis at the bases. Electronically Signed   By: Monte Fantasia M.D.   On: 03/26/2020 10:09   ECHOCARDIOGRAM COMPLETE  Result Date: 03/27/2020    ECHOCARDIOGRAM REPORT   Patient Name:   Vincent Collins Date of Exam: 03/27/2020 Medical Rec #:  [id number]     Height:       68.0 in Accession #:    [id number]    Weight:       242.5 lb Date of Birth:  October 30, 1941     BSA:          2.218 m Patient Age:    79 years      BP:           157/76 mmHg Patient Gender: M             HR:           105 bpm. Exam Location:  Inpatient  Procedure: 2D Echo, Color Doppler and Cardiac Doppler Indications:    I48.91* Unspecified atrial fibrillation  History:        Patient has prior history of Echocardiogram examinations, most                 recent 04/04/2009. COPD; Risk Factors:Hypertension, Diabetes and                 Dyslipidemia.  Sonographer:    Raquel Sarna Senior RDCS Referring Phys: [id number] SYLVESTER I OGBATA  Sonographer Comments: Technically difficult study due to patient body habitus and respiratory variation. IMPRESSIONS  1. Left ventricular ejection fraction, by estimation, is 65 to 70%. The left ventricle has normal function. The left ventricle has no regional wall motion abnormalities. There is moderate left ventricular hypertrophy. Left ventricular diastolic function  could not be evaluated.  2. Right ventricular systolic function is normal. The right ventricular  size is normal.  3. Left atrial size was severely dilated.  4. The mitral valve was not well visualized. No evidence of mitral valve regurgitation.  5. The aortic valve was not well visualized. Aortic valve regurgitation is not visualized.  6. Aortic dilatation noted. 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  7. The inferior vena cava is normal in size with greater than 50% respiratory variability, suggesting right atrial pressure of 3 mmHg. FINDINGS  Left Ventricle: Left ventricular ejection fraction, by estimation, is 65 to 70%. The left ventricle has normal function. The left ventricle has no regional wall motion abnormalities. The left ventricular internal cavity size was normal in size. There is  moderate left ventricular hypertrophy. Left ventricular diastolic function could not be evaluated due to atrial fibrillation. Left ventricular diastolic function could not be evaluated. Right Ventricle: The right ventricular size is normal. No increase in right ventricular wall thickness. Right ventricular systolic function is normal. Left Atrium: Left atrial  size was severely dilated. Right Atrium: Right atrial size was normal in size. Pericardium: There is no evidence of pericardial effusion. Mitral Valve: The mitral valve was not well visualized. No evidence of mitral valve regurgitation. Tricuspid Valve: The tricuspid valve is not well visualized. Tricuspid valve regurgitation is trivial. Aortic Valve: The aortic valve was not well visualized. Aortic valve regurgitation is not visualized. Pulmonic Valve: The pulmonic valve was not well visualized. Pulmonic valve regurgitation is not visualized. Aorta: Aortic dilatation noted. 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 Venous: The inferior vena cava is normal in size with greater than 50% respiratory variability, suggesting right atrial pressure of 3 mmHg. IAS/Shunts: No atrial level shunt detected by color flow Doppler.  LEFT VENTRICLE PLAX 2D LVIDd:         5.50 cm LVIDs:         3.40 cm LV PW:         1.20 cm LV IVS:        1.60 cm LVOT diam:     2.30 cm LV SV:         83 LV SV Index:   37 LVOT Area:     4.15 cm  RIGHT VENTRICLE RV S prime:     22.80 cm/s TAPSE (M-mode): 2.5 cm LEFT ATRIUM              Index       RIGHT ATRIUM           Index LA diam:        3.90 cm  1.76 cm/m  RA Area:     20.80 cm LA Vol (A2C):   100.3 ml 45.22 ml/m RA Volume:   56.80 ml  25.61 ml/m LA Vol (A4C):   118.0 ml 53.20 ml/m LA Biplane Vol: 120.0 ml 54.10 ml/m  AORTIC VALVE LVOT Vmax:   97.50 cm/s LVOT Vmean:  71.000 cm/s LVOT VTI:    0.199 m                          PULMONARY ARTERY AORTA                    MPA diam:        3.10 cm Ao Root diam: 3.90 cm Ao Asc diam:  4.40 cm  SHUNTS Systemic VTI:  0.20 m Systemic Diam: 2.30 cm Zoila Shutter MD Electronically signed by Zoila Shutter MD  Signature Date/Time: 03/27/2020/3:35:16 PM    Final     Anti-infectives: Anti-infectives (From admission, onward)   Start     Dose/Rate Route Frequency Ordered Stop   03/27/20 1100  Ampicillin-Sulbactam (UNASYN) 3 g  in sodium chloride 0.9 % 100 mL IVPB     3 g 200 mL/hr over 30 Minutes Intravenous Every 6 hours 03/27/20 1037     03/25/20 1415  cefTRIAXone (ROCEPHIN) 2 g in sodium chloride 0.9 % 100 mL IVPB  Status:  Discontinued     2 g 200 mL/hr over 30 Minutes Intravenous Every 24 hours 03/25/20 1405 03/27/20 1037   03/25/20 1415  metroNIDAZOLE (FLAGYL) IVPB 500 mg  Status:  Discontinued     500 mg 100 mL/hr over 60 Minutes Intravenous Every 8 hours 03/25/20 1405 03/27/20 1037   03/25/20 0300  piperacillin-tazobactam (ZOSYN) IVPB 3.375 g  Status:  Discontinued     3.375 g 12.5 mL/hr over 240 Minutes Intravenous Every 8 hours 03/25/20 0252 03/25/20 1405   03/24/20 1915  piperacillin-tazobactam (ZOSYN) IVPB 3.375 g     3.375 g 100 mL/hr over 30 Minutes Intravenous  Once 03/24/20 1910 03/24/20 1959       Assessment/Plan HTN COPD HLD GERD DM CP and SOB  A Fib w/ RVR - Per Cards and TRH -  Pancreatitisof unknown etiology ?Acalculous cholecystitis - triglycerides normal, denies alcohol use - no evidence of gallstones on imaging (u/s and MRCP) - Lipase 1476 > 571 > 154> 76 - LFT's downtrending. T bili 1.8  FEN -FLD VTE -SCDs,heparin subq ID -zosyn3/27>>3/28, rocephin/flagyl 3/28 - 3/30. Unasyn 3/31 >> Foley - none Follow up - Dr. Magnus Ivan  Plan: Continue medical management of pancreatitis. GI following. No plans for surgery during admission. Follow up outpatient. We will follow peripherally.    LOS: 3 days    Jacinto Halim , [facility] Surgery 03/28/2020, 9:09 AM Please see Amion for pager number during day hours 7:00am-4:30pm

## 2020-03-28 NOTE — Progress Notes (Signed)
ANTICOAGULATION CONSULT NOTE - Follow Up Consult  Pharmacy Consult for heparin Indication: atrial fibrillation  Allergies  Allergen Reactions  . Diphenhydramine Hcl Other (See Comments)    Can tolerate    Patient Measurements: Height: 5\' 8"  (172.7 cm) Weight: 245 lb (111.1 kg) IBW/kg (Calculated) : 68.4 Heparin Dosing Weight: 93.2 kg  Vital Signs: Temp: 98.1 F (36.7 C) (03/31 1959) Temp Source: Oral (03/31 1959) BP: 140/83 (03/31 1959) Pulse Rate: 93 (03/31 1959)  Labs: Recent Labs    03/26/20 0256 03/26/20 0256 03/26/20 1517 03/26/20 1840 03/27/20 0527 03/27/20 1032 03/27/20 1311 03/28/20 0344 03/28/20 1922  HGB 14.4   < >  --   --  14.7  --   --  14.6  --   HCT 42.2  --   --   --  42.9  --   --  42.9  --   PLT 137*  --   --   --  149*  --   --  147*  --   HEPARINUNFRC  --   --   --   --   --   --   --   --  0.30  CREATININE 1.13  --   --   --  0.94  --   --  0.89  --   TROPONINIHS  --   --    < > 38*  --  27* 22*  --   --    < > = values in this interval not displayed.    Estimated Creatinine Clearance: 84.1 mL/min (by C-G formula based on SCr of 0.89 mg/dL).   Medical History: Past Medical History:  Diagnosis Date  . Broken leg and ribs, right, closed, initial encounter   . COPD (chronic obstructive pulmonary disease) (HCC)   . Diabetes mellitus without complication (HCC)   . Diverticulitis   . GERD (gastroesophageal reflux disease)   . Hydronephrosis of left kidney   . Hyperlipidemia   . Hypertension   . Left ureteral stone    obstructing  . Renal calculus or stone 2015    Assessment: Pharmacy was consulted to dose heparin for atrial fibrillation; he was not on anticoagulation prior to admission. Heparin was ordered earlier in admission, but then cancelled due to conversion to NSR. Pt back in a fib; pharmacy was consulted to convert SQ heparin to IV heparin for a fib. Plans are to convert therapy to DOAC once procedures are finished.   Heparin  level ~7 hr after heparin 4000 units IV bolus, followed by heparin infusion at 1300 units/hr, was 0.30 units/ml, which is at the low end of the goal range for this pt. H/H within normal limits, platelet count low but stable.   Goal of Therapy:  Heparin level 0.3-0.7 units/ml Monitor platelets by anticoagulation protocol: Yes  Plan:  Increase heparin infusion to 1350 units/hr Check 8-hr heparin level Monitor daily heparin level, CBC Monitor for signs/symptoms of bleeding  2016, PharmD, BCPS, [facility] Clinical Pharmacist 03/28/2020 8:20 PM

## 2020-03-28 NOTE — Progress Notes (Signed)
Cardiology Progress Note  Patient ID: Vincent Collins MRN: [id number] DOB: 29-Jul-1941 Date of Encounter: 03/28/2020  Primary Cardiologist: No primary care provider on file.  Subjective  Back in Afib. Reports he is SOB. Lung clear.   ROS:  All other ROS reviewed and negative. Pertinent positives noted in the HPI.     Inpatient Medications  Scheduled Meds: . amiodarone  150 mg Intravenous Once  . fluticasone  2 spray Each Nare Daily  . furosemide  40 mg Intravenous Once  . heparin injection (subcutaneous)  5,000 Units Subcutaneous Q8H  . insulin aspart  0-9 Units Subcutaneous Q4H  . lidocaine  2 patch Transdermal Q24H  . lisinopril  20 mg Oral Daily  . metoprolol tartrate  25 mg Oral BID  . ondansetron  4 mg Oral Once  . pantoprazole (PROTONIX) IV  40 mg Intravenous Q12H  . sodium chloride flush  3 mL Intravenous Q12H  . tamsulosin  0.8 mg Oral QPC supper   Continuous Infusions: . amiodarone     Followed by  . amiodarone    . ampicillin-sulbactam (UNASYN) IV 3 g (03/28/20 0451)   PRN Meds: acetaminophen **OR** acetaminophen, benzonatate, fluticasone, guaiFENesin, levalbuterol, morphine injection, ondansetron (ZOFRAN) IV   Vital Signs   Vitals:   03/28/20 0544 03/28/20 0637 03/28/20 0640 03/28/20 0756  BP: (!) 152/85   (!) 142/75  Pulse: 95   96  Resp: (!) 22 (!) 21 20 20   Temp: 98.2 F (36.8 C)   98.1 F (36.7 C)  TempSrc: Oral   Oral  SpO2: 94%   92%  Weight:      Height:        Intake/Output Summary (Last 24 hours) at 03/28/2020 1114 Last data filed at 03/27/2020 2200 Gross per 24 hour  Intake 715.56 ml  Output 150 ml  Net 565.56 ml   Last 3 Weights 03/28/2020 03/27/2020 03/26/2020  Weight (lbs) 245 lb 242 lb 8 oz 245 lb 9.5 oz  Weight (kg) 111.131 kg 109.997 kg 111.4 kg      Telemetry  Overnight telemetry shows afib with RVR, which I personally reviewed.   ECG  The most recent ECG shows normal sinus rhythm, no acute ischemic changes, questionable  inferior infarct, PVCs noted, which I personally reviewed.   Physical Exam   Vitals:   03/28/20 0544 03/28/20 0637 03/28/20 0640 03/28/20 0756  BP: (!) 152/85   (!) 142/75  Pulse: 95   96  Resp: (!) 22 (!) 21 20 20   Temp: 98.2 F (36.8 C)   98.1 F (36.7 C)  TempSrc: Oral   Oral  SpO2: 94%   92%  Weight:      Height:         Intake/Output Summary (Last 24 hours) at 03/28/2020 1114 Last data filed at 03/27/2020 2200 Gross per 24 hour  Intake 715.56 ml  Output 150 ml  Net 565.56 ml    Last 3 Weights 03/28/2020 03/27/2020 03/26/2020  Weight (lbs) 245 lb 242 lb 8 oz 245 lb 9.5 oz  Weight (kg) 111.131 kg 109.997 kg 111.4 kg    Body mass index is 37.25 kg/m.   General: Well nourished, well developed, tachypnea noted Head: Atraumatic, normal size  Eyes: PEERLA, EOMI  Neck: Supple, no JVD Endocrine: No thryomegaly Cardiac: Normal S1, S2; RRR; no murmurs, rubs, or gallops Lungs: Clear to auscultation bilaterally, no wheezing, rhonchi or rales  Abd: Soft, nontender, no hepatomegaly  Ext: No edema, pulses 2+ Musculoskeletal: No deformities,  BUE and BLE strength normal and equal Skin: Warm and dry, no rashes   Neuro: Alert and oriented to person, place, time, and situation, CNII-XII grossly intact, no focal deficits  Psych: Normal mood and affect   Labs  High Sensitivity Troponin:   Recent Labs  Lab 03/26/20 1517 03/26/20 1840 03/27/20 1032 03/27/20 1311  TROPONINIHS 34* 38* 27* 22*     Cardiac EnzymesNo results for input(s): TROPONINI in the last 168 hours. No results for input(s): TROPIPOC in the last 168 hours.  Chemistry Recent Labs  Lab 03/26/20 0256 03/27/20 0527 03/28/20 0344  NA 139 138 133*  K 3.8 3.4* 3.6  CL 105 99 97*  CO2 21* 24 24  GLUCOSE 147* 122* 143*  BUN 18 13 13   CREATININE 1.13 0.94 0.89  CALCIUM 7.8* 8.2* 7.9*  PROT 5.0* 5.6* 5.1*  ALBUMIN 2.6*  2.6* 2.6* 2.4*  AST 120* 54* 28  ALT 184* 126* 81*  ALKPHOS 145* 169* 138*  BILITOT 6.5*  3.5* 1.8*  GFRNONAA >60 >60 >60  GFRAA >60 >60 >60  ANIONGAP 13 15 12     Hematology Recent Labs  Lab 03/26/20 0256 03/27/20 0527 03/28/20 0344  WBC 16.7* 14.4* 12.2*  RBC 4.78 4.95 4.87  HGB 14.4 14.7 14.6  HCT 42.2 42.9 42.9  MCV 88.3 86.7 88.1  MCH 30.1 29.7 30.0  MCHC 34.1 34.3 34.0  RDW 13.3 13.3 13.5  PLT 137* 149* 147*   BNP Recent Labs  Lab 03/26/20 0256  BNP 230.0*    DDimer  Recent Labs  Lab 03/26/20 1517  DDIMER 6.11*     Radiology  CT ANGIO CHEST PE W OR WO CONTRAST  Result Date: 03/26/2020 CLINICAL DATA:  Shortness of breath EXAM: CT ANGIOGRAPHY CHEST WITH CONTRAST TECHNIQUE: Multidetector CT imaging of the chest was performed using the standard protocol during bolus administration of intravenous contrast. Multiplanar CT image reconstructions and MIPs were obtained to evaluate the vascular anatomy. CONTRAST:  126mL OMNIPAQUE IOHEXOL 350 MG/ML SOLN COMPARISON:  Plain film from earlier in the same day, MRI from the previous day. FINDINGS: Cardiovascular: Thoracic aorta and its branches demonstrate atherosclerotic calcifications without aneurysmal dilatation. No dissection is seen. No cardiac enlargement is seen. Minimal pericardial fluid is noted. Coronary calcifications are seen. The pulmonary artery shows a normal branching pattern. No definitive filling defects to suggest pulmonary emboli are noted. Mediastinum/Nodes: Thoracic inlet is within normal limits. No hilar or mediastinal adenopathy is noted. The esophagus as visualized is within normal limits. Lungs/Pleura: Considerable motion artifact is noted. The left lung is well aerated without focal infiltrate or sizable effusion. Small right-sided pleural effusion and right lower lobe atelectatic changes are seen. No focal confluent infiltrate is noted. No parenchymal nodules are seen. Upper Abdomen: Visualized upper abdomen again demonstrates some mild peripancreatic inflammatory change consistent with the known  history of pancreatitis. Renal cysts are seen. Musculoskeletal: Degenerative changes of the thoracic spine are seen. Healing rib fractures are noted on the right involving the sixth through eighth ribs anteriorly. No definitive left rib fractures are seen. No compression deformities are seen. Review of the MIP images confirms the above findings. IMPRESSION: Multiple right-sided rib fractures with healing. A small right effusion is noted which is likely compensatory in nature. Mild associated atelectasis is noted. No pneumothorax is seen. Mild changes of pancreatitis similar to that noted on prior MRI from the previous day. Aortic Atherosclerosis (ICD10-I70.0). Electronically Signed   By: Inez Catalina M.D.   On: 03/26/2020 20:51  DG Chest Port 1 View  Result Date: 03/28/2020 CLINICAL DATA:  Shortness of breath, cough. EXAM: PORTABLE CHEST 1 VIEW COMPARISON:  CT chest and chest radiograph 03/26/2020. FINDINGS: Trachea is midline. Heart is enlarged, stable. Minimal right basilar atelectasis or scarring. Thickening of the minor fissure. Lungs are otherwise clear. No pleural fluid. IMPRESSION: No acute findings. Electronically Signed   By: Leanna Battles M.D.   On: 03/28/2020 10:26   DG CHEST PORT 1 VIEW  Result Date: 03/26/2020 CLINICAL DATA:  Short of breath, COPD, previous tobacco abuse, diabetes, hypertension EXAM: PORTABLE CHEST 1 VIEW COMPARISON:  03/26/2020 at 9:57 a.m. FINDINGS: Single frontal view of the chest demonstrates a stable cardiac silhouette. No airspace disease, effusion, or pneumothorax. Improved aeration at the lung bases. No acute bony abnormalities. IMPRESSION: 1. No acute process. Electronically Signed   By: Sharlet Salina M.D.   On: 03/26/2020 18:42   ECHOCARDIOGRAM COMPLETE  Result Date: 03/27/2020    ECHOCARDIOGRAM REPORT   Patient Name:   Vincent Collins Date of Exam: 03/27/2020 Medical Rec #:  [id number]     Height:       68.0 in Accession #:    [id number]    Weight:       242.5 lb  Date of Birth:  Aug 27, 1941     BSA:          2.218 m Patient Age:    78 years      BP:           157/76 mmHg Patient Gender: M             HR:           105 bpm. Exam Location:  Inpatient Procedure: 2D Echo, Color Doppler and Cardiac Doppler Indications:    I48.91* Unspecified atrial fibrillation  History:        Patient has prior history of Echocardiogram examinations, most                 recent 04/04/2009. COPD; Risk Factors:Hypertension, Diabetes and                 Dyslipidemia.  Sonographer:    Irving Burton Senior RDCS Referring Phys: [id number] SYLVESTER I OGBATA  Sonographer Comments: Technically difficult study due to patient body habitus and respiratory variation. IMPRESSIONS  1. Left ventricular ejection fraction, by estimation, is 65 to 70%. The left ventricle has normal function. The left ventricle has no regional wall motion abnormalities. There is moderate left ventricular hypertrophy. Left ventricular diastolic function  could not be evaluated.  2. Right ventricular systolic function is normal. The right ventricular size is normal.  3. Left atrial size was severely dilated.  4. The mitral valve was not well visualized. No evidence of mitral valve regurgitation.  5. The aortic valve was not well visualized. Aortic valve regurgitation is not visualized.  6. Aortic dilatation noted. 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  7. The inferior vena cava is normal in size with greater than 50% respiratory variability, suggesting right atrial pressure of 3 mmHg. FINDINGS  Left Ventricle: Left ventricular ejection fraction, by estimation, is 65 to 70%. The left ventricle has normal function. The left ventricle has no regional wall motion abnormalities. The left ventricular internal cavity size was normal in size. There is  moderate left ventricular hypertrophy. Left ventricular diastolic function could not be evaluated due to atrial fibrillation. Left ventricular diastolic function could not be  evaluated. Right Ventricle: The  right ventricular size is normal. No increase in right ventricular wall thickness. Right ventricular systolic function is normal. Left Atrium: Left atrial size was severely dilated. Right Atrium: Right atrial size was normal in size. Pericardium: There is no evidence of pericardial effusion. Mitral Valve: The mitral valve was not well visualized. No evidence of mitral valve regurgitation. Tricuspid Valve: The tricuspid valve is not well visualized. Tricuspid valve regurgitation is trivial. Aortic Valve: The aortic valve was not well visualized. Aortic valve regurgitation is not visualized. Pulmonic Valve: The pulmonic valve was not well visualized. Pulmonic valve regurgitation is not visualized. Aorta: Aortic dilatation noted. 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 Venous: The inferior vena cava is normal in size with greater than 50% respiratory variability, suggesting right atrial pressure of 3 mmHg. IAS/Shunts: No atrial level shunt detected by color flow Doppler.  LEFT VENTRICLE PLAX 2D LVIDd:         5.50 cm LVIDs:         3.40 cm LV PW:         1.20 cm LV IVS:        1.60 cm LVOT diam:     2.30 cm LV SV:         83 LV SV Index:   37 LVOT Area:     4.15 cm  RIGHT VENTRICLE RV S prime:     22.80 cm/s TAPSE (M-mode): 2.5 cm LEFT ATRIUM              Index       RIGHT ATRIUM           Index LA diam:        3.90 cm  1.76 cm/m  RA Area:     20.80 cm LA Vol (A2C):   100.3 ml 45.22 ml/m RA Volume:   56.80 ml  25.61 ml/m LA Vol (A4C):   118.0 ml 53.20 ml/m LA Biplane Vol: 120.0 ml 54.10 ml/m  AORTIC VALVE LVOT Vmax:   97.50 cm/s LVOT Vmean:  71.000 cm/s LVOT VTI:    0.199 m                          PULMONARY ARTERY AORTA                    MPA diam:        3.10 cm Ao Root diam: 3.90 cm Ao Asc diam:  4.40 cm  SHUNTS Systemic VTI:  0.20 m Systemic Diam: 2.30 cm Zoila ShutterKenneth Hilty MD Electronically signed by Zoila ShutterKenneth Hilty MD Signature Date/Time:  03/27/2020/3:35:16 PM    Final     TTE 03/27/2020 1. Left ventricular ejection fraction, by estimation, is 65 to 70%. The  left ventricle has normal function. The left ventricle has no regional  wall motion abnormalities. There is moderate left ventricular hypertrophy.  Left ventricular diastolic function  could not be evaluated.  2. Right ventricular systolic function is normal. The right ventricular  size is normal.  3. Left atrial size was severely dilated.  4. The mitral valve was not well visualized. No evidence of mitral valve  regurgitation.  5. The aortic valve was not well visualized. Aortic valve regurgitation  is not visualized.  6. Aortic dilatation noted. There is moderate dilatation of the ascending  aorta measuring 44 mm.  7. The inferior vena cava is normal in size with greater than 50%  respiratory variability, suggesting right  atrial pressure of 3 mmHg.   Patient Profile  Mr. Steinhart is a 79 yo M with COPD, HTN, DM who was admitted for acute pancreatitis. 03/26/2020 he developed Afib with RVR and was given metoprolol 25 mg po with conversion back to NSR. On 03/27/2020 overnight he converted back to Afib.   Assessment & Plan   1.  New onset atrial fibrillation with RVR -Secondary to acute pancreatitis. EKG without acute ischemic changes. Troponin mildly elevated and flat.  -03/27/2020: Back in Afib -will start heparin drip and amiodarone drip. Will plan to rhythm control to get him through pancreatitis.  -surgery with plans for HIDA. Although no plans for surgery this admission, will await final recommendations for this. Would not want to start DOAC to delay any procedure.  -EF normal.   2.  Chest pain -Troponin flat. In epigastric area. Echo without WMA.   3. SOB -He is noticeably SOB. Lung clear and CXR without edema. Worse in Afib. Will pursue rhythm control.   For questions or updates, please contact [facility] Please consult [email or web address] for contact  info under   Time Spent with Patient: I have spent a total of 35 minutes with patient reviewing hospital notes, telemetry, EKGs, labs and examining the patient as well as establishing an assessment and plan that was discussed with the patient.  > 50% of time was spent in direct patient care.    Signed, Lenna Gilford. Flora Lipps, MD [facility]  [facility]  03/28/2020 11:14 AM

## 2020-03-28 NOTE — Progress Notes (Signed)
Patient noted with MEWS score of 4 secondary to HR (Afib) up to 160 momentarily while coughing. Cough medicine administered. Patient without complaints besides abdominal tenderness from coughing. Denies chest pain or palpitations. Currently sitting in recliner resting comfortably.

## 2020-03-28 NOTE — Plan of Care (Signed)
Problem: Education: Goal: Knowledge of General Education information will improve Description: Including pain rating scale, medication(s)/side effects and non-pharmacologic comfort measures 03/28/2020 1100 by Saunders Revel, RN Outcome: Progressing 03/28/2020 1056 by Saunders Revel, RN Outcome: Progressing   Problem: Health Behavior/Discharge Planning: Goal: Ability to manage health-related needs will improve 03/28/2020 1100 by Saunders Revel, RN Outcome: Progressing 03/28/2020 1056 by Saunders Revel, RN Outcome: Progressing   Problem: Clinical Measurements: Goal: Ability to maintain clinical measurements within normal limits will improve 03/28/2020 1100 by Saunders Revel, RN Outcome: Progressing 03/28/2020 1056 by Saunders Revel, RN Outcome: Progressing Goal: Will remain free from infection 03/28/2020 1100 by Saunders Revel, RN Outcome: Progressing 03/28/2020 1056 by Saunders Revel, RN Outcome: Progressing Goal: Diagnostic test results will improve 03/28/2020 1100 by Saunders Revel, RN Outcome: Progressing 03/28/2020 1056 by Saunders Revel, RN Outcome: Progressing Goal: Respiratory complications will improve 03/28/2020 1100 by Saunders Revel, RN Outcome: Progressing 03/28/2020 1056 by Saunders Revel, RN Outcome: Progressing Goal: Cardiovascular complication will be avoided 03/28/2020 1100 by Saunders Revel, RN Outcome: Progressing 03/28/2020 1056 by Saunders Revel, RN Outcome: Progressing   Problem: Activity: Goal: Risk for activity intolerance will decrease 03/28/2020 1100 by Saunders Revel, RN Outcome: Progressing 03/28/2020 1056 by Saunders Revel, RN Outcome: Progressing   Problem: Nutrition: Goal: Adequate nutrition will be maintained 03/28/2020 1100 by Saunders Revel, RN Outcome: Progressing 03/28/2020 1056 by Saunders Revel, RN Outcome: Progressing   Problem: Coping: Goal: Level of anxiety will  decrease 03/28/2020 1100 by Saunders Revel, RN Outcome: Progressing 03/28/2020 1056 by Saunders Revel, RN Outcome: Progressing   Problem: Elimination: Goal: Will not experience complications related to bowel motility 03/28/2020 1100 by Saunders Revel, RN Outcome: Progressing 03/28/2020 1056 by Saunders Revel, RN Outcome: Progressing Goal: Will not experience complications related to urinary retention 03/28/2020 1100 by Saunders Revel, RN Outcome: Progressing 03/28/2020 1056 by Saunders Revel, RN Outcome: Progressing   Problem: Pain Managment: Goal: General experience of comfort will improve 03/28/2020 1100 by Saunders Revel, RN Outcome: Progressing 03/28/2020 1056 by Saunders Revel, RN Outcome: Progressing   Problem: Safety: Goal: Ability to remain free from injury will improve 03/28/2020 1100 by Saunders Revel, RN Outcome: Progressing 03/28/2020 1056 by Saunders Revel, RN Outcome: Progressing   Problem: Skin Integrity: Goal: Risk for impaired skin integrity will decrease 03/28/2020 1100 by Saunders Revel, RN Outcome: Progressing 03/28/2020 1056 by Saunders Revel, RN Outcome: Progressing   Problem: Education: Goal: Knowledge of disease or condition will improve 03/28/2020 1100 by Saunders Revel, RN Outcome: Progressing 03/28/2020 1056 by Saunders Revel, RN Outcome: Progressing Goal: Knowledge of the prescribed therapeutic regimen will improve 03/28/2020 1100 by Saunders Revel, RN Outcome: Progressing 03/28/2020 1056 by Saunders Revel, RN Outcome: Progressing Goal: Individualized Educational Video(s) 03/28/2020 1100 by Saunders Revel, RN Outcome: Progressing 03/28/2020 1056 by Saunders Revel, RN Outcome: Progressing   Problem: Activity: Goal: Ability to tolerate increased activity will improve 03/28/2020 1100 by Saunders Revel, RN Outcome: Progressing 03/28/2020 1056 by Saunders Revel, RN Outcome:  Progressing Goal: Will verbalize the importance of balancing activity with adequate rest periods 03/28/2020 1100 by Saunders Revel, RN Outcome: Progressing 03/28/2020 1056 by Saunders Revel, RN Outcome: Progressing   Problem: Respiratory: Goal: Ability to maintain a clear airway will improve 03/28/2020 1100 by Saunders Revel, RN Outcome: Progressing 03/28/2020 1056  by Arlina Robes, RN Outcome: Progressing Goal: Levels of oxygenation will improve 03/28/2020 1100 by Arlina Robes, RN Outcome: Progressing 03/28/2020 1056 by Arlina Robes, RN Outcome: Progressing Goal: Ability to maintain adequate ventilation will improve 03/28/2020 1100 by Arlina Robes, RN Outcome: Progressing 03/28/2020 1056 by Arlina Robes, RN Outcome: Progressing

## 2020-03-28 NOTE — Progress Notes (Signed)
ANTICOAGULATION CONSULT NOTE - Initial Consult  Pharmacy Consult for heparin Indication: atrial fibrillation  Allergies  Allergen Reactions  . Diphenhydramine Hcl Other (See Comments)    Can tolerate    Patient Measurements: Height: 5\' 8"  (172.7 cm) Weight: 245 lb (111.1 kg) IBW/kg (Calculated) : 68.4 Heparin Dosing Weight: 93.2 kg  Vital Signs: Temp: 98.1 F (36.7 C) (03/31 0756) Temp Source: Oral (03/31 0756) BP: 142/75 (03/31 0756) Pulse Rate: 96 (03/31 0756)  Labs: Recent Labs    03/26/20 0256 03/26/20 0256 03/26/20 1517 03/26/20 1840 03/27/20 0527 03/27/20 1032 03/27/20 1311 03/28/20 0344  HGB 14.4   < >  --   --  14.7  --   --  14.6  HCT 42.2  --   --   --  42.9  --   --  42.9  PLT 137*  --   --   --  149*  --   --  147*  CREATININE 1.13  --   --   --  0.94  --   --  0.89  TROPONINIHS  --   --    < > 38*  --  27* 22*  --    < > = values in this interval not displayed.    Estimated Creatinine Clearance: 84.1 mL/min (by C-G formula based on SCr of 0.89 mg/dL).   Medical History: Past Medical History:  Diagnosis Date  . Broken leg and ribs, right, closed, initial encounter   . COPD (chronic obstructive pulmonary disease) ([place])   . Diabetes mellitus without complication ([place])   . Diverticulitis   . GERD (gastroesophageal reflux disease)   . Hydronephrosis of left kidney   . Hyperlipidemia   . Hypertension   . Left ureteral stone    obstructing  . Renal calculus or stone 2015    Medications:  Medications Prior to Admission  Medication Sig Dispense Refill Last Dose  . albuterol (PROAIR HFA) 108 (90 Base) MCG/ACT inhaler USE 2 PUFFS EVERY 6 HOURS  AS NEEDED FOR WHEEZING (Patient taking differently: Inhale 1-2 puffs into the lungs every 6 (six) hours as needed for wheezing. ) 8.5 g 5 unknown  . albuterol (PROVENTIL) (2.5 MG/3ML) 0.083% nebulizer solution Take 3 mLs (2.5 mg total) by nebulization 4 (four) times daily as needed for wheezing or shortness  of breath. 360 mL 1 unknown  . aspirin 81 MG EC tablet Take 81 mg by mouth every evening.    03/23/2020 at Unknown time  . atorvastatin (LIPITOR) 40 MG tablet TAKE 1 TABLET BY MOUTH  DAILY (Patient taking differently: Take 40 mg by mouth every evening. ) 90 tablet 0 03/23/2020 at Unknown time  . Cholecalciferol (VITAMIN D) 1000 UNITS capsule Take 1,000 Units by mouth in the morning.    03/24/2020 at Unknown time  . Coenzyme Q10-Fish Oil-Vit E (CO-Q 10 OMEGA-3 FISH OIL) CAPS Take 1 capsule by mouth in the morning. 100mg  Capsule Qunol   03/24/2020 at Unknown time  . fluticasone (FLONASE) 50 MCG/ACT nasal spray USE 1 SPRAY INTO BOTH  NOSTRILS 2 TIMES DAILY AS  NEEDED FOR ALLERGIES OR  RHINITIS (Patient taking differently: Place 1 spray into both nostrils 2 (two) times daily as needed for allergies. FOR ALLERGIES OR  RHINITIS.) 48 g 0 03/24/2020 at Unknown time  . lisinopril (ZESTRIL) 20 MG tablet TAKE 1 TABLET BY MOUTH  DAILY (Patient taking differently: Take 20 mg by mouth every morning. ) 90 tablet 0 03/24/2020 at Unknown time  . metFORMIN (  GLUCOPHAGE-XR) 500 MG 24 hr tablet TAKE 1 TABLET BY MOUTH TWO  TIMES DAILY (Patient taking differently: Take 500 mg by mouth in the morning and at bedtime. ) 180 tablet 0 03/24/2020 at Unknown time  . metoprolol succinate (TOPROL-XL) 50 MG 24 hr tablet TAKE 1 TABLET BY MOUTH  DAILY WITH OR IMMEDIATELY  FOLLOWING A MEAL (Patient taking differently: Take 50 mg by mouth every morning. ) 90 tablet 0 03/24/2020 at 800  . omeprazole (PRILOSEC) 20 MG capsule TAKE 1 CAPSULE BY MOUTH  DAILY (Patient taking differently: Take 20 mg by mouth daily. ) 90 capsule 0 03/24/2020 at Unknown time  . tamsulosin (FLOMAX) 0.4 MG CAPS capsule TAKE 2 CAPSULES BY MOUTH  DAILY AFTER SUPPER (Patient taking differently: Take 0.8 mg by mouth daily after supper. ) 180 capsule 0 03/23/2020 at Unknown time    Assessment: Pharmacy consulted to dose heparin for atrial fibrillation. Not on anticoagulation prior  to admission. Noted that heparin was ordered earlier in admission but then cancelled due to conversion to NSR. Back in afib overnight, orders to change sq heparin to IV heparin.    Plan on converting to DOAC once any procedures are finished. H/H within normal limits, platelet count low but stable.   Goal of Therapy:  Heparin level 0.3-0.7 units/ml Monitor platelets by anticoagulation protocol: Yes   Plan:  Give heparin 4000 units bolus x 1 Start heparin infusion at 1300 units/hr Check anti-Xa level in 8 hours and daily while on heparin Continue to monitor H&H, platelets, and s/s bleeding  Sheppard Coil PharmD., BCPS Clinical Pharmacist 03/28/2020 11:13 AM

## 2020-03-28 NOTE — Progress Notes (Signed)
Erroneous visit, in hospital today, spoke with wife Arville Care, MD [facility] 03/28/2020, 8:09 AM

## 2020-03-28 NOTE — Progress Notes (Signed)
Patient c/o not sleeping well last hs due to coughing last hs that drove his heart rate up reports guaifenesin not very effective. He reports abdomen sore from coughing so deep. Writer messaged MD see can get something else for cough. No further changes noted.

## 2020-03-28 NOTE — Progress Notes (Signed)
[facility] Progress Note  Subjective: No abdominal pain today.  He does have some cough and some shortness of breath.  Cardiology is seeing him.  Labs in regards to pancreatitis are improved.  Objective: Vital signs in last 24 hours: Temp:  [98.1 F (36.7 C)-98.7 F (37.1 C)] 98.1 F (36.7 C) (03/31 0756) Pulse Rate:  [87-96] 96 (03/31 0756) Resp:  [16-22] 20 (03/31 0756) BP: (139-164)/(74-85) 142/75 (03/31 0756) SpO2:  [89 %-94 %] 92 % (03/31 0756) Weight:  [111.1 kg] 111.1 kg (03/31 0418) Weight change: -0.269 kg   PE:  No distress  Abdomen soft and nontender  Lab Results: Results for orders placed or performed during the hospital encounter of 03/24/20 (from the past 24 hour(s))  Troponin I (High Sensitivity)     Status: Abnormal   Collection Time: 03/27/20  1:11 PM  Result Value Ref Range   Troponin I (High Sensitivity) 22 (H) <18 ng/L  Glucose, capillary     Status: Abnormal   Collection Time: 03/27/20  4:00 PM  Result Value Ref Range   Glucose-Capillary 158 (H) 70 - 99 mg/dL  Glucose, capillary     Status: Abnormal   Collection Time: 03/27/20  7:57 PM  Result Value Ref Range   Glucose-Capillary 158 (H) 70 - 99 mg/dL  Glucose, capillary     Status: Abnormal   Collection Time: 03/28/20 12:01 AM  Result Value Ref Range   Glucose-Capillary 123 (H) 70 - 99 mg/dL  CBC     Status: Abnormal   Collection Time: 03/28/20  3:44 AM  Result Value Ref Range   WBC 12.2 (H) 4.0 - 10.5 K/uL   RBC 4.87 4.22 - 5.81 MIL/uL   Hemoglobin 14.6 13.0 - 17.0 g/dL   HCT 06.3 01.6 - 01.0 %   MCV 88.1 80.0 - 100.0 fL   MCH 30.0 26.0 - 34.0 pg   MCHC 34.0 30.0 - 36.0 g/dL   RDW 93.2 35.5 - 73.2 %   Platelets 147 (L) 150 - 400 K/uL   nRBC 0.0 0.0 - 0.2 %  Comprehensive metabolic panel     Status: Abnormal   Collection Time: 03/28/20  3:44 AM  Result Value Ref Range   Sodium 133 (L) 135 - 145 mmol/L   Potassium 3.6 3.5 - 5.1 mmol/L   Chloride 97 (L) 98 - 111 mmol/L   CO2  24 22 - 32 mmol/L   Glucose, Bld 143 (H) 70 - 99 mg/dL   BUN 13 8 - 23 mg/dL   Creatinine, Ser 2.02 0.61 - 1.24 mg/dL   Calcium 7.9 (L) 8.9 - 10.3 mg/dL   Total Protein 5.1 (L) 6.5 - 8.1 g/dL   Albumin 2.4 (L) 3.5 - 5.0 g/dL   AST 28 15 - 41 U/L   ALT 81 (H) 0 - 44 U/L   Alkaline Phosphatase 138 (H) 38 - 126 U/L   Total Bilirubin 1.8 (H) 0.3 - 1.2 mg/dL   GFR calc non Af Amer >60 >60 mL/min   GFR calc Af Amer >60 >60 mL/min   Anion gap 12 5 - 15  Magnesium     Status: None   Collection Time: 03/28/20  3:44 AM  Result Value Ref Range   Magnesium 1.9 1.7 - 2.4 mg/dL  Lipase, blood     Status: Abnormal   Collection Time: 03/28/20  3:44 AM  Result Value Ref Range   Lipase 76 (H) 11 - 51 U/L  Glucose, capillary  Status: Abnormal   Collection Time: 03/28/20  3:53 AM  Result Value Ref Range   Glucose-Capillary 142 (H) 70 - 99 mg/dL  Glucose, capillary     Status: Abnormal   Collection Time: 03/28/20  7:55 AM  Result Value Ref Range   Glucose-Capillary 171 (H) 70 - 99 mg/dL   Comment 1 Notify RN    Comment 2 Document in Chart   Glucose, capillary     Status: Abnormal   Collection Time: 03/28/20 11:09 AM  Result Value Ref Range   Glucose-Capillary 131 (H) 70 - 99 mg/dL   Comment 1 Notify RN    Comment 2 Document in Chart     Studies/Results: DG Chest Port 1 View  Result Date: 03/28/2020 CLINICAL DATA:  Shortness of breath, cough. EXAM: PORTABLE CHEST 1 VIEW COMPARISON:  CT chest and chest radiograph 03/26/2020. FINDINGS: Trachea is midline. Heart is enlarged, stable. Minimal right basilar atelectasis or scarring. Thickening of the minor fissure. Lungs are otherwise clear. No pleural fluid. IMPRESSION: No acute findings. Electronically Signed   By: Lorin Picket M.D.   On: 03/28/2020 10:26   ECHOCARDIOGRAM COMPLETE  Result Date: 03/27/2020    ECHOCARDIOGRAM REPORT   Patient Name:   DOMANIK Vincent Collins Date of Exam: 03/27/2020 Medical Rec #:  [id number]     Height:       68.0 in  Accession #:    [id number]    Weight:       242.5 lb Date of Birth:  1941-11-02     BSA:          2.218 m Patient Age:    79 years      BP:           157/76 mmHg Patient Gender: M             HR:           105 bpm. Exam Location:  Inpatient Procedure: 2D Echo, Color Doppler and Cardiac Doppler Indications:    I48.91* Unspecified atrial fibrillation  History:        Patient has prior history of Echocardiogram examinations, most                 recent 04/04/2009. COPD; Risk Factors:Hypertension, Diabetes and                 Dyslipidemia.  Sonographer:    Raquel Sarna Senior RDCS Referring Phys: [id number] SYLVESTER I OGBATA  Sonographer Comments: Technically difficult study due to patient body habitus and respiratory variation. IMPRESSIONS  1. Left ventricular ejection fraction, by estimation, is 65 to 70%. The left ventricle has normal function. The left ventricle has no regional wall motion abnormalities. There is moderate left ventricular hypertrophy. Left ventricular diastolic function  could not be evaluated.  2. Right ventricular systolic function is normal. The right ventricular size is normal.  3. Left atrial size was severely dilated.  4. The mitral valve was not well visualized. No evidence of mitral valve regurgitation.  5. The aortic valve was not well visualized. Aortic valve regurgitation is not visualized.  6. Aortic dilatation noted. 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  7. The inferior vena cava is normal in size with greater than 50% respiratory variability, suggesting right atrial pressure of 3 mmHg. FINDINGS  Left Ventricle: Left ventricular ejection fraction, by estimation, is 65 to 70%. The left ventricle has normal function. The left ventricle has no regional wall motion abnormalities. The left ventricular  internal cavity size was normal in size. There is  moderate left ventricular hypertrophy. Left ventricular diastolic function could not be evaluated due to atrial  fibrillation. Left ventricular diastolic function could not be evaluated. Right Ventricle: The right ventricular size is normal. No increase in right ventricular wall thickness. Right ventricular systolic function is normal. Left Atrium: Left atrial size was severely dilated. Right Atrium: Right atrial size was normal in size. Pericardium: There is no evidence of pericardial effusion. Mitral Valve: The mitral valve was not well visualized. No evidence of mitral valve regurgitation. Tricuspid Valve: The tricuspid valve is not well visualized. Tricuspid valve regurgitation is trivial. Aortic Valve: The aortic valve was not well visualized. Aortic valve regurgitation is not visualized. Pulmonic Valve: The pulmonic valve was not well visualized. Pulmonic valve regurgitation is not visualized. Aorta: Aortic dilatation noted. 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 Venous: The inferior vena cava is normal in size with greater than 50% respiratory variability, suggesting right atrial pressure of 3 mmHg. IAS/Shunts: No atrial level shunt detected by color flow Doppler.  LEFT VENTRICLE PLAX 2D LVIDd:         5.50 cm LVIDs:         3.40 cm LV PW:         1.20 cm LV IVS:        1.60 cm LVOT diam:     2.30 cm LV SV:         83 LV SV Index:   37 LVOT Area:     4.15 cm  RIGHT VENTRICLE RV S prime:     22.80 cm/s TAPSE (M-mode): 2.5 cm LEFT ATRIUM              Index       RIGHT ATRIUM           Index LA diam:        3.90 cm  1.76 cm/m  RA Area:     20.80 cm LA Vol (A2C):   100.3 ml 45.22 ml/m RA Volume:   56.80 ml  25.61 ml/m LA Vol (A4C):   118.0 ml 53.20 ml/m LA Biplane Vol: 120.0 ml 54.10 ml/m  AORTIC VALVE LVOT Vmax:   97.50 cm/s LVOT Vmean:  71.000 cm/s LVOT VTI:    0.199 m                          PULMONARY ARTERY AORTA                    MPA diam:        3.10 cm Ao Root diam: 3.90 cm Ao Asc diam:  4.40 cm  SHUNTS Systemic VTI:  0.20 m Systemic Diam: 2.30 cm Zoila Shutter MD Electronically  signed by Zoila Shutter MD Signature Date/Time: 03/27/2020/3:35:16 PM    Final       Assessment: Pancreatitis, suspect biliary origin  Plan:   Continue supportive care    Gwenevere Abbot 03/28/2020, 11:21 AM  Pager: [phone] If no answer or after 5 PM call [phone]

## 2020-03-29 LAB — GLUCOSE, CAPILLARY
Glucose-Capillary: 105 mg/dL — ABNORMAL HIGH (ref 70–99)
Glucose-Capillary: 142 mg/dL — ABNORMAL HIGH (ref 70–99)
Glucose-Capillary: 210 mg/dL — ABNORMAL HIGH (ref 70–99)
Glucose-Capillary: 139 mg/dL — ABNORMAL HIGH (ref 70–99)
Glucose-Capillary: 186 mg/dL — ABNORMAL HIGH (ref 70–99)
Glucose-Capillary: 204 mg/dL — ABNORMAL HIGH (ref 70–99)

## 2020-03-29 LAB — COMPREHENSIVE METABOLIC PANEL
ALT: 73 U/L — ABNORMAL HIGH (ref 0–44)
AST: 41 U/L (ref 15–41)
Albumin: 2.6 g/dL — ABNORMAL LOW (ref 3.5–5.0)
Alkaline Phosphatase: 133 U/L — ABNORMAL HIGH (ref 38–126)
Anion gap: 13 (ref 5–15)
BUN: 11 mg/dL (ref 8–23)
CO2: 28 mmol/L (ref 22–32)
Calcium: 8.3 mg/dL — ABNORMAL LOW (ref 8.9–10.3)
Chloride: 97 mmol/L — ABNORMAL LOW (ref 98–111)
Creatinine, Ser: 1.05 mg/dL (ref 0.61–1.24)
GFR calc Af Amer: >60 mL/min (ref >60)
GFR calc non Af Amer: >60 mL/min (ref >60)
Glucose, Bld: 126 mg/dL — ABNORMAL HIGH (ref 70–99)
Potassium: 3.2 mmol/L — ABNORMAL LOW (ref 3.5–5.1)
Sodium: 138 mmol/L (ref 135–145)
Total Bilirubin: 1.8 mg/dL — ABNORMAL HIGH (ref 0.3–1.2)
Total Protein: 5.4 g/dL — ABNORMAL LOW (ref 6.5–8.1)

## 2020-03-29 LAB — MAGNESIUM: Magnesium: 1.8 mg/dL (ref 1.7–2.4)

## 2020-03-29 LAB — CBC
HCT: 43.5 % (ref 39.0–52.0)
Hemoglobin: 15.1 g/dL (ref 13.0–17.0)
MCH: 30.4 pg (ref 26.0–34.0)
MCHC: 34.7 g/dL (ref 30.0–36.0)
MCV: 87.7 fL (ref 80.0–100.0)
Platelets: 174 10*3/uL (ref 150–400)
RBC: 4.96 MIL/uL (ref 4.22–5.81)
RDW: 13.6 % (ref 11.5–15.5)
WBC: 11.4 10*3/uL — ABNORMAL HIGH (ref 4.0–10.5)
nRBC: 0 % (ref 0.0–0.2)

## 2020-03-29 LAB — HEPARIN LEVEL (UNFRACTIONATED): Heparin Unfractionated: 0.32 IU/mL (ref 0.30–0.70)

## 2020-03-29 MED ORDER — POTASSIUM CHLORIDE CRYS ER 20 MEQ PO TBCR
40.0000 meq | EXTENDED_RELEASE_TABLET | ORAL | Status: AC
  Administered 2020-03-29 (×2): 40 meq via ORAL
  Filled 2020-03-29 (×2): qty 2

## 2020-03-29 MED ORDER — APIXABAN 5 MG PO TABS
5.0000 mg | ORAL_TABLET | Freq: Two times a day (BID) | ORAL | Status: DC
  Administered 2020-03-29 – 2020-03-30 (×3): 5 mg via ORAL
  Filled 2020-03-29 (×3): qty 1

## 2020-03-29 MED ORDER — MAGNESIUM SULFATE 2 GM/50ML IV SOLN
2.0000 g | Freq: Once | INTRAVENOUS | Status: AC
  Administered 2020-03-29: 2 g via INTRAVENOUS
  Filled 2020-03-29: qty 50

## 2020-03-29 NOTE — Plan of Care (Signed)
  Problem: Clinical Measurements: Goal: Respiratory complications will improve Outcome: Progressing   Problem: Clinical Measurements: Goal: Cardiovascular complication will be avoided Outcome: Progressing   

## 2020-03-29 NOTE — Care Management Important Message (Signed)
Important Message  Patient Details  Name: Vincent Collins MRN: [id number] Date of Birth: 1941-11-04   Medicare Important Message Given:  Yes     Renie Ora 03/29/2020, 9:08 AM

## 2020-03-29 NOTE — Progress Notes (Addendum)
Subjective: CC: Patient reports abdominal pain has subsided. Denies abdominal pain to myself, GI and on flowsheets. Tolerating fld without n/v. Had a BM this morning. Currently on heparin and amio drip.   Objective: Vital signs in last 24 hours: Temp:  [97.7 F (36.5 C)-98.1 F (36.7 C)] 97.7 F (36.5 C) (04/01 0829) Pulse Rate:  [79-103] 101 (04/01 0829) Resp:  [20-22] 20 (04/01 0829) BP: (138-158)/(70-90) 138/70 (04/01 0829) SpO2:  [93 %-97 %] 93 % (04/01 0851) Weight:  [111.4 kg] 111.4 kg (04/01 0244) Last BM Date: 03/25/20  Intake/Output from previous day: 03/31 0701 - 04/01 0700 In: 2121.2 [P.O.:702; I.V.:967.8; IV Piggyback:451.4] Out: 3651 [Urine:3650; Stool:1] Intake/Output this shift: Total I/O In: 620 [P.O.:620] Out: -   PE: Gen: Awake and alert, NAD Lungs: Normal rate and effort today. Improved Abd: Obese, soft, ND, NT, +BS. Small umbilical hernia   Lab Results:  Recent Labs    03/28/20 0344 03/29/20 0438  WBC 12.2* 11.4*  HGB 14.6 15.1  HCT 42.9 43.5  PLT 147* 174   BMET Recent Labs    03/28/20 0344 03/29/20 0438  NA 133* 138  K 3.6 3.2*  CL 97* 97*  CO2 24 28  GLUCOSE 143* 126*  BUN 13 11  CREATININE 0.89 1.05  CALCIUM 7.9* 8.3*   PT/INR No results for input(s): LABPROT, INR in the last 72 hours. CMP     Component Value Date/Time   NA 138 03/29/2020 0438   NA 141 01/04/2019 1117   K 3.2 (L) 03/29/2020 0438   CL 97 (L) 03/29/2020 0438   CO2 28 03/29/2020 0438   GLUCOSE 126 (H) 03/29/2020 0438   BUN 11 03/29/2020 0438   BUN 19 01/04/2019 1117   CREATININE 1.05 03/29/2020 0438   CALCIUM 8.3 (L) 03/29/2020 0438   PROT 5.4 (L) 03/29/2020 0438   PROT 6.2 01/04/2019 1117   ALBUMIN 2.6 (L) 03/29/2020 0438   ALBUMIN 4.1 01/04/2019 1117   AST 41 03/29/2020 0438   ALT 73 (H) 03/29/2020 0438   ALKPHOS 133 (H) 03/29/2020 0438   BILITOT 1.8 (H) 03/29/2020 0438   BILITOT 1.0 01/04/2019 1117   GFRNONAA >60 03/29/2020 0438   GFRAA  >60 03/29/2020 0438   Lipase     Component Value Date/Time   LIPASE 76 (H) 03/28/2020 0344       Studies/Results: DG Chest Port 1 View  Result Date: 03/28/2020 CLINICAL DATA:  Shortness of breath, cough. EXAM: PORTABLE CHEST 1 VIEW COMPARISON:  CT chest and chest radiograph 03/26/2020. FINDINGS: Trachea is midline. Heart is enlarged, stable. Minimal right basilar atelectasis or scarring. Thickening of the minor fissure. Lungs are otherwise clear. No pleural fluid. IMPRESSION: No acute findings. Electronically Signed   By: Lorin Picket M.D.   On: 03/28/2020 10:26   ECHOCARDIOGRAM COMPLETE  Result Date: 03/27/2020    ECHOCARDIOGRAM REPORT   Patient Name:   Vincent Collins Date of Exam: 03/27/2020 Medical Rec #:  [id number]     Height:       68.0 in Accession #:    [id number]    Weight:       242.5 lb Date of Birth:  Jun 15, 1941     BSA:          2.218 m Patient Age:    79 years      BP:           157/76 mmHg Patient Gender: M  HR:           105 bpm. Exam Location:  Inpatient Procedure: 2D Echo, Color Doppler and Cardiac Doppler Indications:    I48.91* Unspecified atrial fibrillation  History:        Patient has prior history of Echocardiogram examinations, most                 recent 04/04/2009. COPD; Risk Factors:Hypertension, Diabetes and                 Dyslipidemia.  Sonographer:    Irving Burton Senior RDCS Referring Phys: [id number] SYLVESTER I OGBATA  Sonographer Comments: Technically difficult study due to patient body habitus and respiratory variation. IMPRESSIONS  1. Left ventricular ejection fraction, by estimation, is 65 to 70%. The left ventricle has normal function. The left ventricle has no regional wall motion abnormalities. There is moderate left ventricular hypertrophy. Left ventricular diastolic function  could not be evaluated.  2. Right ventricular systolic function is normal. The right ventricular size is normal.  3. Left atrial size was severely dilated.  4. The mitral valve was not  well visualized. No evidence of mitral valve regurgitation.  5. The aortic valve was not well visualized. Aortic valve regurgitation is not visualized.  6. Aortic dilatation noted. 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  7. The inferior vena cava is normal in size with greater than 50% respiratory variability, suggesting right atrial pressure of 3 mmHg. FINDINGS  Left Ventricle: Left ventricular ejection fraction, by estimation, is 65 to 70%. The left ventricle has normal function. The left ventricle has no regional wall motion abnormalities. The left ventricular internal cavity size was normal in size. There is  moderate left ventricular hypertrophy. Left ventricular diastolic function could not be evaluated due to atrial fibrillation. Left ventricular diastolic function could not be evaluated. Right Ventricle: The right ventricular size is normal. No increase in right ventricular wall thickness. Right ventricular systolic function is normal. Left Atrium: Left atrial size was severely dilated. Right Atrium: Right atrial size was normal in size. Pericardium: There is no evidence of pericardial effusion. Mitral Valve: The mitral valve was not well visualized. No evidence of mitral valve regurgitation. Tricuspid Valve: The tricuspid valve is not well visualized. Tricuspid valve regurgitation is trivial. Aortic Valve: The aortic valve was not well visualized. Aortic valve regurgitation is not visualized. Pulmonic Valve: The pulmonic valve was not well visualized. Pulmonic valve regurgitation is not visualized. Aorta: Aortic dilatation noted. 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 Venous: The inferior vena cava is normal in size with greater than 50% respiratory variability, suggesting right atrial pressure of 3 mmHg. IAS/Shunts: No atrial level shunt detected by color flow Doppler.  LEFT VENTRICLE PLAX 2D LVIDd:         5.50 cm LVIDs:         3.40 cm LV PW:          1.20 cm LV IVS:        1.60 cm LVOT diam:     2.30 cm LV SV:         83 LV SV Index:   37 LVOT Area:     4.15 cm  RIGHT VENTRICLE RV S prime:     22.80 cm/s TAPSE (M-mode): 2.5 cm LEFT ATRIUM              Index       RIGHT ATRIUM  Index LA diam:        3.90 cm  1.76 cm/m  RA Area:     20.80 cm LA Vol (A2C):   100.3 ml 45.22 ml/m RA Volume:   56.80 ml  25.61 ml/m LA Vol (A4C):   118.0 ml 53.20 ml/m LA Biplane Vol: 120.0 ml 54.10 ml/m  AORTIC VALVE LVOT Vmax:   97.50 cm/s LVOT Vmean:  71.000 cm/s LVOT VTI:    0.199 m                          PULMONARY ARTERY AORTA                    MPA diam:        3.10 cm Ao Root diam: 3.90 cm Ao Asc diam:  4.40 cm  SHUNTS Systemic VTI:  0.20 m Systemic Diam: 2.30 cm Zoila Shutter MD Electronically signed by Zoila Shutter MD Signature Date/Time: 03/27/2020/3:35:16 PM    Final     Anti-infectives: Anti-infectives (From admission, onward)   Start     Dose/Rate Route Frequency Ordered Stop   03/27/20 1100  Ampicillin-Sulbactam (UNASYN) 3 g in sodium chloride 0.9 % 100 mL IVPB     3 g 200 mL/hr over 30 Minutes Intravenous Every 6 hours 03/27/20 1037     03/25/20 1415  cefTRIAXone (ROCEPHIN) 2 g in sodium chloride 0.9 % 100 mL IVPB  Status:  Discontinued     2 g 200 mL/hr over 30 Minutes Intravenous Every 24 hours 03/25/20 1405 03/27/20 1037   03/25/20 1415  metroNIDAZOLE (FLAGYL) IVPB 500 mg  Status:  Discontinued     500 mg 100 mL/hr over 60 Minutes Intravenous Every 8 hours 03/25/20 1405 03/27/20 1037   03/25/20 0300  piperacillin-tazobactam (ZOSYN) IVPB 3.375 g  Status:  Discontinued     3.375 g 12.5 mL/hr over 240 Minutes Intravenous Every 8 hours 03/25/20 0252 03/25/20 1405   03/24/20 1915  piperacillin-tazobactam (ZOSYN) IVPB 3.375 g     3.375 g 100 mL/hr over 30 Minutes Intravenous  Once 03/24/20 1910 03/24/20 1959       Assessment/Plan HTN COPD HLD GERD DM CP and SOB  A Fib w/ RVR - Per Cards and TRH -  Pancreatitisof  unknown etiology ?Acalculous cholecystitis - triglycerides normal, denies alcohol use - no evidence of gallstones on imaging (u/s and MRCP) - Lipase 1476 > 571 > 154> 76 (3/31) - LFT's downtrending. T bili 1.8 - Pain has resolved.  - No plans for surgery during admission  - We will sign off. Please call back with questions or concerns. Further recs as below.   FEN -Soft  VTE -SCDs,heparin gtt. Cardiology can start DOAC ID -[id number]>>3/28, rocephin/flagyl 3/28 - 3/30. Unasyn 3/31 >> Foley - none Follow up - Dr. Magnus Ivan  Plan: Discussed with MD. Patient WBC downtrending, LFTs downtrending and pain has resolved. He is tolerating a diet currently. Feel patient can be discharged with short course of abx (Augmentin for total of 10 days of abx) and follow up in the office to discuss interval Lap Chole. No plans for surgery during admission. Cardiology can start DOAC. We will sign off.     LOS: 4 days    Jacinto Halim , [facility] 03/29/2020, 9:32 AM Please see Amion for pager number during day hours 7:00am-4:30pm

## 2020-03-29 NOTE — Progress Notes (Signed)
[facility] Progress Note  Subjective: Patient feels good today. Denies abdominal pain. Breathing is much better.  Objective: Vital signs in last 24 hours: Temp:  [97.7 F (36.5 C)-98.1 F (36.7 C)] 97.7 F (36.5 C) (04/01 0829) Pulse Rate:  [79-103] 101 (04/01 0829) Resp:  [20-22] 20 (04/01 0829) BP: (138-158)/(70-90) 138/70 (04/01 0829) SpO2:  [93 %-97 %] 93 % (04/01 0851) Weight:  [111.4 kg] 111.4 kg (04/01 0244) Weight change: 0.318 kg   PE:no distress  Abdomen soft  Lab Results: Results for orders placed or performed during the hospital encounter of 03/24/20 (from the past 24 hour(s))  Glucose, capillary     Status: Abnormal   Collection Time: 03/28/20 11:09 AM  Result Value Ref Range   Glucose-Capillary 131 (H) 70 - 99 mg/dL   Comment 1 Notify RN    Comment 2 Document in Chart   Glucose, capillary     Status: Abnormal   Collection Time: 03/28/20  4:40 PM  Result Value Ref Range   Glucose-Capillary 120 (H) 70 - 99 mg/dL   Comment 1 Notify RN    Comment 2 Document in Chart   Heparin level (unfractionated)     Status: None   Collection Time: 03/28/20  7:22 PM  Result Value Ref Range   Heparin Unfractionated 0.30 0.30 - 0.70 IU/mL  Glucose, capillary     Status: Abnormal   Collection Time: 03/28/20  7:56 PM  Result Value Ref Range   Glucose-Capillary 184 (H) 70 - 99 mg/dL  Glucose, capillary     Status: Abnormal   Collection Time: 03/28/20 11:51 PM  Result Value Ref Range   Glucose-Capillary 148 (H) 70 - 99 mg/dL  Glucose, capillary     Status: Abnormal   Collection Time: 03/29/20  3:55 AM  Result Value Ref Range   Glucose-Capillary 105 (H) 70 - 99 mg/dL  Comprehensive metabolic panel     Status: Abnormal   Collection Time: 03/29/20  4:38 AM  Result Value Ref Range   Sodium 138 135 - 145 mmol/L   Potassium 3.2 (L) 3.5 - 5.1 mmol/L   Chloride 97 (L) 98 - 111 mmol/L   CO2 28 22 - 32 mmol/L   Glucose, Bld 126 (H) 70 - 99 mg/dL   BUN 11 8 - 23  mg/dL   Creatinine, Ser 1.05 0.61 - 1.24 mg/dL   Calcium 8.3 (L) 8.9 - 10.3 mg/dL   Total Protein 5.4 (L) 6.5 - 8.1 g/dL   Albumin 2.6 (L) 3.5 - 5.0 g/dL   AST 41 15 - 41 U/L   ALT 73 (H) 0 - 44 U/L   Alkaline Phosphatase 133 (H) 38 - 126 U/L   Total Bilirubin 1.8 (H) 0.3 - 1.2 mg/dL   GFR calc non Af Amer >60 >60 mL/min   GFR calc Af Amer >60 >60 mL/min   Anion gap 13 5 - 15  Magnesium     Status: None   Collection Time: 03/29/20  4:38 AM  Result Value Ref Range   Magnesium 1.8 1.7 - 2.4 mg/dL  CBC     Status: Abnormal   Collection Time: 03/29/20  4:38 AM  Result Value Ref Range   WBC 11.4 (H) 4.0 - 10.5 K/uL   RBC 4.96 4.22 - 5.81 MIL/uL   Hemoglobin 15.1 13.0 - 17.0 g/dL   HCT 43.5 39.0 - 52.0 %   MCV 87.7 80.0 - 100.0 fL   MCH 30.4 26.0 - 34.0 pg   MCHC  34.7 30.0 - 36.0 g/dL   RDW 73.4 19.3 - 79.0 %   Platelets 174 150 - 400 K/uL   nRBC 0.0 0.0 - 0.2 %  Heparin level (unfractionated)     Status: None   Collection Time: 03/29/20  4:38 AM  Result Value Ref Range   Heparin Unfractionated 0.32 0.30 - 0.70 IU/mL  Glucose, capillary     Status: Abnormal   Collection Time: 03/29/20  7:48 AM  Result Value Ref Range   Glucose-Capillary 142 (H) 70 - 99 mg/dL  Glucose, capillary     Status: Abnormal   Collection Time: 03/29/20  8:21 AM  Result Value Ref Range   Glucose-Capillary 210 (H) 70 - 99 mg/dL    Studies/Results: DG Chest Port 1 View  Result Date: 03/28/2020 CLINICAL DATA:  Shortness of breath, cough. EXAM: PORTABLE CHEST 1 VIEW COMPARISON:  CT chest and chest radiograph 03/26/2020. FINDINGS: Trachea is midline. Heart is enlarged, stable. Minimal right basilar atelectasis or scarring. Thickening of the minor fissure. Lungs are otherwise clear. No pleural fluid. IMPRESSION: No acute findings. Electronically Signed   By: Leanna Battles M.D.   On: 03/28/2020 10:26      Assessment: Acute pancreatitis probably of biliary origin  Plan:   Since he is clinically  improved I think he will probably be able to go home soon. We will sign off. Call us if needed.    SAM F Kajsa Butrum 03/29/2020, 9:30 AM  Pager: [phone] If no answer or after 5 PM call [phone]

## 2020-03-29 NOTE — Progress Notes (Signed)
Cardiology Progress Note  Patient ID: Vincent Collins MRN: [id number] DOB: [birth year] Date of Encounter: 03/29/2020  Primary Cardiologist: No primary care provider on file.  Subjective  Good urine output. Breathing better. Back in NSR.   ROS:  All other ROS reviewed and negative. Pertinent positives noted in the HPI.     Inpatient Medications  Scheduled Meds: . apixaban  5 mg Oral BID  . fluticasone  2 spray Each Nare Daily  . insulin aspart  0-9 Units Subcutaneous Q4H  . ipratropium  0.5 mg Nebulization TID  . levalbuterol  0.63 mg Nebulization TID  . lidocaine  2 patch Transdermal Q24H  . lisinopril  20 mg Oral Daily  . metoprolol tartrate  25 mg Oral BID  . ondansetron  4 mg Oral Once  . pantoprazole (PROTONIX) IV  40 mg Intravenous Q12H  . potassium chloride  40 mEq Oral Q4H  . sodium chloride flush  3 mL Intravenous Q12H  . tamsulosin  0.8 mg Oral QPC supper   Continuous Infusions: . amiodarone 30 mg/hr (03/29/20 0836)  . ampicillin-sulbactam (UNASYN) IV 3 g (03/29/20 0500)   PRN Meds: acetaminophen **OR** acetaminophen, benzonatate, fluticasone, guaiFENesin, morphine injection, ondansetron (ZOFRAN) IV   Vital Signs   Vitals:   03/29/20 0244 03/29/20 0244 03/29/20 0829 03/29/20 0851  BP:  139/88 138/70   Pulse:  79 (!) 101   Resp:  (!) 22 20   Temp:  98 F (36.7 C) 97.7 F (36.5 C)   TempSrc:  Oral Oral   SpO2:  96% 93% 93%  Weight: 111.4 kg     Height:        Intake/Output Summary (Last 24 hours) at 03/29/2020 1121 Last data filed at 03/29/2020 0852 Gross per 24 hour  Intake 2741.16 ml  Output 3651 ml  Net -909.84 ml   Last 3 Weights 03/29/2020 03/28/2020 03/27/2020  Weight (lbs) 245 lb 11.2 oz 245 lb 242 lb 8 oz  Weight (kg) 111.449 kg 111.131 kg 109.997 kg      Telemetry  Overnight telemetry shows NSR, which I personally reviewed.   ECG  The most recent ECG shows normal sinus rhythm, no acute ischemic changes, questionable inferior infarct, PVCs noted,  which I personally reviewed.   Physical Exam   Vitals:   03/29/20 0244 03/29/20 0244 03/29/20 0829 03/29/20 0851  BP:  139/88 138/70   Pulse:  79 (!) 101   Resp:  (!) 22 20   Temp:  98 F (36.7 C) 97.7 F (36.5 C)   TempSrc:  Oral Oral   SpO2:  96% 93% 93%  Weight: 111.4 kg     Height:         Intake/Output Summary (Last 24 hours) at 03/29/2020 1121 Last data filed at 03/29/2020 0852 Gross per 24 hour  Intake 2741.16 ml  Output 3651 ml  Net -909.84 ml    Last 3 Weights 03/29/2020 03/28/2020 03/27/2020  Weight (lbs) 245 lb 11.2 oz 245 lb 242 lb 8 oz  Weight (kg) 111.449 kg 111.131 kg 109.997 kg    Body mass index is 37.36 kg/m.   General: Well nourished, well developed, tachypnea noted Head: Atraumatic, normal size  Eyes: PEERLA, EOMI  Neck: Supple, no JVD Endocrine: No thryomegaly Cardiac: Normal S1, S2; RRR; no murmurs, rubs, or gallops Lungs: Clear to auscultation bilaterally, no wheezing, rhonchi or rales  Abd: Soft, nontender, no hepatomegaly  Ext: No edema, pulses 2+ Musculoskeletal: No deformities, BUE and BLE strength normal and  equal Skin: Warm and dry, no rashes   Neuro: Alert and oriented to person, place, time, and situation, CNII-XII grossly intact, no focal deficits  Psych: Normal mood and affect   Labs  High Sensitivity Troponin:   Recent Labs  Lab 03/26/20 1517 03/26/20 1840 03/27/20 1032 03/27/20 1311  TROPONINIHS 34* 38* 27* 22*     Cardiac EnzymesNo results for input(s): TROPONINI in the last 168 hours. No results for input(s): TROPIPOC in the last 168 hours.  Chemistry Recent Labs  Lab 03/27/20 0527 03/28/20 0344 03/29/20 0438  NA 138 133* 138  K 3.4* 3.6 3.2*  CL 99 97* 97*  CO2 24 24 28   GLUCOSE 122* 143* 126*  BUN 13 13 11   CREATININE 0.94 0.89 1.05  CALCIUM 8.2* 7.9* 8.3*  PROT 5.6* 5.1* 5.4*  ALBUMIN 2.6* 2.4* 2.6*  AST 54* 28 41  ALT 126* 81* 73*  ALKPHOS 169* 138* 133*  BILITOT 3.5* 1.8* 1.8*  GFRNONAA >60 >60 >60  GFRAA  >60 >60 >60  ANIONGAP 15 12 13     Hematology Recent Labs  Lab 03/27/20 0527 03/28/20 0344 03/29/20 0438  WBC 14.4* 12.2* 11.4*  RBC 4.95 4.87 4.96  HGB 14.7 14.6 15.1  HCT 42.9 42.9 43.5  MCV 86.7 88.1 87.7  MCH 29.7 30.0 30.4  MCHC 34.3 34.0 34.7  RDW 13.3 13.5 13.6  PLT 149* 147* 174   BNP Recent Labs  Lab 03/26/20 0256 03/28/20 0344  BNP 230.0* 187.9*    DDimer  Recent Labs  Lab 03/26/20 1517  DDIMER 6.11*     Radiology  DG Chest Port 1 View  Result Date: 03/28/2020 CLINICAL DATA:  Shortness of breath, cough. EXAM: PORTABLE CHEST 1 VIEW COMPARISON:  CT chest and chest radiograph 03/26/2020. FINDINGS: Trachea is midline. Heart is enlarged, stable. Minimal right basilar atelectasis or scarring. Thickening of the minor fissure. Lungs are otherwise clear. No pleural fluid. IMPRESSION: No acute findings. Electronically Signed   By: 03/28/20 M.D.   On: 03/28/2020 10:26   ECHOCARDIOGRAM COMPLETE  Result Date: 03/27/2020    ECHOCARDIOGRAM REPORT   Patient Name:   Vincent Collins Date of Exam: 03/27/2020 Medical Rec #:  03/29/2020     Height:       68.0 in Accession #:    [name]    Weight:       242.5 lb Date of Birth:  [birth year]     BSA:          2.218 m Patient Age:    78 years      BP:           157/76 mmHg Patient Gender: M             HR:           105 bpm. Exam Location:  Inpatient Procedure: 2D Echo, Color Doppler and Cardiac Doppler Indications:    I48.91* Unspecified atrial fibrillation  History:        Patient has prior history of Echocardiogram examinations, most                 recent 04/04/2009. COPD; Risk Factors:Hypertension, Diabetes and                 Dyslipidemia.  Sonographer:    06/15/1941 Senior RDCS Referring Phys: 07-28-1997 SYLVESTER I OGBATA  Sonographer Comments: Technically difficult study due to patient body habitus and respiratory variation. IMPRESSIONS  1. Left ventricular ejection fraction, by estimation, is 65  to 70%. The left ventricle has normal  function. The left ventricle has no regional wall motion abnormalities. There is moderate left ventricular hypertrophy. Left ventricular diastolic function  could not be evaluated.  2. Right ventricular systolic function is normal. The right ventricular size is normal.  3. Left atrial size was severely dilated.  4. The mitral valve was not well visualized. No evidence of mitral valve regurgitation.  5. The aortic valve was not well visualized. Aortic valve regurgitation is not visualized.  6. Aortic dilatation noted. 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  7. The inferior vena cava is normal in size with greater than 50% respiratory variability, suggesting right atrial pressure of 3 mmHg. FINDINGS  Left Ventricle: Left ventricular ejection fraction, by estimation, is 65 to 70%. The left ventricle has normal function. The left ventricle has no regional wall motion abnormalities. The left ventricular internal cavity size was normal in size. There is  moderate left ventricular hypertrophy. Left ventricular diastolic function could not be evaluated due to atrial fibrillation. Left ventricular diastolic function could not be evaluated. Right Ventricle: The right ventricular size is normal. No increase in right ventricular wall thickness. Right ventricular systolic function is normal. Left Atrium: Left atrial size was severely dilated. Right Atrium: Right atrial size was normal in size. Pericardium: There is no evidence of pericardial effusion. Mitral Valve: The mitral valve was not well visualized. No evidence of mitral valve regurgitation. Tricuspid Valve: The tricuspid valve is not well visualized. Tricuspid valve regurgitation is trivial. Aortic Valve: The aortic valve was not well visualized. Aortic valve regurgitation is not visualized. Pulmonic Valve: The pulmonic valve was not well visualized. Pulmonic valve regurgitation is not visualized. Aorta: Aortic dilatation noted. There is  moderate dilatation of the ascending aorta measuring 44 mm. Venous: The inferior vena cava is normal in size with greater than 50% respiratory variability, suggesting right atrial pressure of 3 mmHg. IAS/Shunts: No atrial level shunt detected by color flow Doppler.  LEFT VENTRICLE PLAX 2D LVIDd:         5.50 cm LVIDs:         3.40 cm LV PW:         1.20 cm LV IVS:        1.60 cm LVOT diam:     2.30 cm LV SV:         83 LV SV Index:   37 LVOT Area:     4.15 cm  RIGHT VENTRICLE RV S prime:     22.80 cm/s TAPSE (M-mode): 2.5 cm LEFT ATRIUM              Index       RIGHT ATRIUM           Index LA diam:        3.90 cm  1.76 cm/m  RA Area:     20.80 cm LA Vol (A2C):   100.3 ml 45.22 ml/m RA Volume:   56.80 ml  25.61 ml/m LA Vol (A4C):   118.0 ml 53.20 ml/m LA Biplane Vol: 120.0 ml 54.10 ml/m  AORTIC VALVE LVOT Vmax:   97.50 cm/s LVOT Vmean:  71.000 cm/s LVOT VTI:    0.199 m                          PULMONARY ARTERY AORTA  MPA diam:        3.10 cm Ao Root diam: 3.90 cm Ao Asc diam:  4.40 cm  SHUNTS Systemic VTI:  0.20 m Systemic Diam: 2.30 cm Zoila Shutter MD Electronically signed by Zoila Shutter MD Signature Date/Time: 03/27/2020/3:35:16 PM    Final     TTE 03/27/2020 1. Left ventricular ejection fraction, by estimation, is 65 to 70%. The  left ventricle has normal function. The left ventricle has no regional  wall motion abnormalities. There is moderate left ventricular hypertrophy.  Left ventricular diastolic function  could not be evaluated.  2. Right ventricular systolic function is normal. The right ventricular  size is normal.  3. Left atrial size was severely dilated.  4. The mitral valve was not well visualized. No evidence of mitral valve  regurgitation.  5. The aortic valve was not well visualized. Aortic valve regurgitation  is not visualized.  6. Aortic dilatation noted. There is moderate dilatation of the ascending  aorta measuring 44 mm.  7. The inferior vena  cava is normal in size with greater than 50%  respiratory variability, suggesting right atrial pressure of 3 mmHg.   Patient Profile  Mr. Sinkfield is a 79 yo M with COPD, HTN, DM who was admitted for acute pancreatitis. 03/26/2020 he developed Afib with RVR and was given metoprolol 25 mg po with conversion back to NSR. On 03/27/2020 overnight he converted back to Afib.   Assessment & Plan   1.  New onset atrial fibrillation with RVR -Secondary to acute pancreatitis. EKG without acute ischemic changes. Troponin mildly elevated and flat.  -03/27/2020: Back in Afib -started amiodarone and now in NSR -will plan for 24 hours IV amiodarone and then load (400 mg BID x 7 days) followed by 200 mg QD for 21 days  -will transition to eliquis -he will follow-up with me at discharge -TTE normal -no plans for surgery this admission   2.  Chest pain -Troponin flat. In epigastric area. Echo without WMA.   3. SOB -better after diuresis. Likely volume up due to IVF from pancreatitis   For questions or updates, please contact [facility] Please consult [email or web address] for contact info under   Time Spent with Patient: I have spent a total of 25 minutes with patient reviewing hospital notes, telemetry, EKGs, labs and examining the patient as well as establishing an assessment and plan that was discussed with the patient.  > 50% of time was spent in direct patient care.    Signed, Lenna Gilford. Flora Lipps, MD [facility]  [facility]  03/29/2020 11:21 AM

## 2020-03-29 NOTE — Progress Notes (Signed)
ANTICOAGULATION CONSULT NOTE - Follow Up Consult  Pharmacy Consult for heparin Indication: atrial fibrillation  Allergies  Allergen Reactions  . Diphenhydramine Hcl Other (See Comments)    Can tolerate    Patient Measurements: Height: 5\' 8"  (172.7 cm) Weight: 111.4 kg (245 lb 11.2 oz) IBW/kg (Calculated) : 68.4 Heparin Dosing Weight: 93.2 kg  Vital Signs: Temp: 97.7 F (36.5 C) (04/01 0829) Temp Source: Oral (04/01 0829) BP: 138/70 (04/01 0829) Pulse Rate: 101 (04/01 0829)  Labs: Recent Labs    03/26/20 1840 03/27/20 0527 03/27/20 0527 03/27/20 1032 03/27/20 1311 03/28/20 0344 03/28/20 1922 03/29/20 0438  HGB  --  14.7   < >  --   --  14.6  --  15.1  HCT  --  42.9  --   --   --  42.9  --  43.5  PLT  --  149*  --   --   --  147*  --  174  HEPARINUNFRC  --   --   --   --   --   --  0.30 0.32  CREATININE  --  0.94  --   --   --  0.89  --  1.05  TROPONINIHS 38*  --   --  27* 22*  --   --   --    < > = values in this interval not displayed.    Estimated Creatinine Clearance: 71.3 mL/min (by C-G formula based on SCr of 1.05 mg/dL).   Medical History: Past Medical History:  Diagnosis Date  . Broken leg and ribs, right, closed, initial encounter   . COPD (chronic obstructive pulmonary disease) (HCC)   . Diabetes mellitus without complication (HCC)   . Diverticulitis   . GERD (gastroesophageal reflux disease)   . Hydronephrosis of left kidney   . Hyperlipidemia   . Hypertension   . Left ureteral stone    obstructing  . Renal calculus or stone 2015    Assessment: Pharmacy was consulted to dose heparin for atrial fibrillation; he was not on anticoagulation prior to admission. Heparin was ordered earlier in admission, but then cancelled due to conversion to NSR. Pt back in a fib; pharmacy was consulted to convert SQ heparin to IV heparin for a fib. Plans are to convert therapy to DOAC once procedures are finished.   Heparin level therapeutic this morning at 0.32,  CBC WNL, no issues with infusion or bleeding report per RN.  Goal of Therapy:  Heparin level 0.3-0.7 units/ml Monitor platelets by anticoagulation protocol: Yes  Plan:  Continue heparin at 1350 units/hour Daily heparin level, CBC Monitor for s/sx's of bleed   Dayton Kenley L. 2016, PharmD [facility] PGY1 Pharmacy Resident [phone] 03/29/20      8:46 AM  Please check AMION for all [facility] phone numbers After 10:00 PM, call the Main Pharmacy [phone]

## 2020-03-29 NOTE — Discharge Instructions (Signed)

## 2020-03-29 NOTE — Progress Notes (Signed)
Transitions of Care Pharmacist Note  Vincent Collins is a 79 y.o. male that has been diagnosed with A Fib and will be prescribed Eliquis (apixaban) at discharge.   Patient Education: I provided the following education on Apixaban to the patient: How to take the medication Described what the medication is Signs of bleeding Signs/symptoms of VTE and stroke  Answered their questions  Discharge Medications Plan: The patient wants to have their discharge medications filled by the Transitions of Care pharmacy rather than their usual pharmacy.  The discharge orders pharmacy has been changed to the Transitions of Care pharmacy, the patient will receive a phone call regarding co-pay, and their medications will be delivered by the Transitions of Care pharmacy.    Thank you,   Fabio Neighbors, PharmD PGY1 Ambulatory Care Resident [facility] # [phone]  March 29, 2020

## 2020-03-29 NOTE — Progress Notes (Signed)
Patient reports feeling much better today,he ambulated with rehab did well he is considerably less short of breath just wearing O2 when short of breath on exertion,at rest he denies shortness of breath. He has converted from afib to NSR on tele. He put out good urine output from Lasix yesterday felt like the Lasix helped him. He reports still has cough or tickle in his throat,but it is not as bad as before. He reports as soon as he eats immedialely has have BM food runs thru him quickly he reports. Epigastric pain only hurts now when he coughs.No further changes noted.

## 2020-03-29 NOTE — Progress Notes (Signed)
PROGRESS NOTE    Vincent Collins  RDE:[id number] DOB: [birth year] DOA: 03/24/2020 PCP: Dettinger, Elige Radon, MD   Brief Narrative:  79 year old with history of COPD, DM 2, HTN, HLD, left-sided hydronephrosis, diverticulitis currently admitted for acute pancreatitis and sepsis secondary to E. coli bacteremia with possible acute cholangitis.  Hospital course complicated by atrial fibrillation with RVR.  Patient's pancreatitis was conservatively managed, remain on IV Unasyn.  His diet was slowly advanced.  For his A. fib he required amiodarone and heparin drip which was managed by cardiology team.   Assessment & Plan:   Principal Problem:   Cholangitis Active Problems:   Essential hypertension   COPD (chronic obstructive pulmonary disease) (HCC)   Type 2 diabetes mellitus (HCC)   Acute pancreatitis, unclear etiology Acalculus cholecystitis -Improving with conservative management, continue Unasyn -Advance diet to GI soft today. -MRCP showed acute necrotizing pancreatitis, cholecystitis without stones  Atrial fibrillation with RVR, now normal sinus rhythm -Now back in normal sinus rhythm.  Continue p.o. metoprolol 25 mg twice daily -Continue amiodarone loading, and then transition to p.o. hopefully next 24 hours -Plans to transition from heparin drip to Eliquis.  Discussed with cardiology, appreciate input. -Replete electrolytes as appropriate -Echocardiogram-EF 65-70%.  E. coli bacteremia -Pansensitive.  Continue Unasyn, will switch over to Augmentin upon discharge to complete total 10-day course  Mild to moderate shortness of breath with orthopnea and cough, improved Breathing is significantly improved with diuresis, rate control and bronchodilator.  Essential hypertension, uncontrolled -Resume lisinopril 20 mg daily  Hyperlipidemia -We will resume statin hopefully next week  DM2 -Sliding scale and Accu-Cheks.  BPH -Flomax  DVT prophylaxis: Subcu heparin Code Status: Full  code Family Communication: None Disposition Plan:   Patient From= home  Patient Anticipated D/C place= Home  Barriers= maintain hospital stay for next 24 hours to ensure his tolerating his p.o. in the meantime continue his IV amiodarone loading with transition tomorrow.  Subjective: His shortness of breath is better this morning, now back in normal sinus rhythm after being on amiodarone drip.  Tolerating clear liquid diet therefore will advance to soft diet.  Review of Systems Otherwise negative except as per HPI, including: General = no fevers, chills, dizziness,  fatigue HEENT/EYES = negative for loss of vision, double vision, blurred vision,  sore throa Cardiovascular= negative for chest pain, palpitation Respiratory/lungs= negative for shortness of breath, cough, wheezing; hemoptysis,  Gastrointestinal= negative for nausea, vomiting, abdominal pain Genitourinary= negative for Dysuria MSK = Negative for arthralgia, myalgias Neurology= Negative for headache, numbness, tingling  Psychiatry= Negative for suicidal and homocidal ideation Skin= Negative for Rash  Examination:  Constitutional: Not in acute distress Respiratory: Clear to auscultation bilaterally Cardiovascular: Normal sinus rhythm, no rubs Abdomen: Nontender nondistended good bowel sounds Musculoskeletal: No edema noted Skin: No rashes seen Neurologic: CN 2-12 grossly intact.  And nonfocal Psychiatric: Normal judgment and insight. Alert and oriented x 3. Normal mood.     Objective: Vitals:   03/29/20 0244 03/29/20 0244 03/29/20 0829 03/29/20 0851  BP:  139/88 138/70   Pulse:  79 (!) 101   Resp:  (!) 22 20   Temp:  98 F (36.7 C) 97.7 F (36.5 C)   TempSrc:  Oral Oral   SpO2:  96% 93% 93%  Weight: 111.4 kg     Height:        Intake/Output Summary (Last 24 hours) at 03/29/2020 1119 Last data filed at 03/29/2020 0852 Gross per 24 hour  Intake 2741.16 ml  Output  3651 ml  Net -909.84 ml   Filed Weights    03/27/20 0505 03/28/20 0418 03/29/20 0244  Weight: 110 kg 111.1 kg 111.4 kg     Data Reviewed:   CBC: Recent Labs  Lab 03/24/20 1820 03/24/20 1820 03/25/20 0420 03/26/20 0256 03/27/20 0527 03/28/20 0344 03/29/20 0438  WBC 7.7   < > 20.4* 16.7* 14.4* 12.2* 11.4*  NEUTROABS 7.1  --  18.7* 14.7* 12.5*  --   --   HGB 16.1   < > 15.0 14.4 14.7 14.6 15.1  HCT 47.7   < > 44.7 42.2 42.9 42.9 43.5  MCV 90.0   < > 89.0 88.3 86.7 88.1 87.7  PLT 178   < > 161 137* 149* 147* 174   < > = values in this interval not displayed.   Basic Metabolic Panel: Recent Labs  Lab 03/25/20 0420 03/26/20 0256 03/27/20 0527 03/28/20 0344 03/29/20 0438  NA 138 139 138 133* 138  K 3.3* 3.8 3.4* 3.6 3.2*  CL 104 105 99 97* 97*  CO2 22 21* 24 24 28   GLUCOSE 178* 147* 122* 143* 126*  BUN 19 18 13 13 11   CREATININE 1.49* 1.13 0.94 0.89 1.05  CALCIUM 8.1* 7.8* 8.2* 7.9* 8.3*  MG 1.2* 2.2 1.9 1.9 1.8  PHOS  --  2.5 2.2*  --   --    GFR: Estimated Creatinine Clearance: 71.3 mL/min (by C-G formula based on SCr of 1.05 mg/dL). Liver Function Tests: Recent Labs  Lab 03/25/20 0420 03/26/20 0256 03/27/20 0527 03/28/20 0344 03/29/20 0438  AST 266* 120* 54* 28 41  ALT 258* 184* 126* 81* 73*  ALKPHOS 145* 145* 169* 138* 133*  BILITOT 5.1* 6.5* 3.5* 1.8* 1.8*  PROT 5.3* 5.0* 5.6* 5.1* 5.4*  ALBUMIN 2.7* 2.6*  2.6* 2.6* 2.4* 2.6*   Recent Labs  Lab 03/25/20 0140 03/26/20 0256 03/27/20 0527 03/28/20 0344  LIPASE 1,476* 571* 154* 76*   No results for input(s): AMMONIA in the last 168 hours. Coagulation Profile: Recent Labs  Lab 03/24/20 1820 03/25/20 0420  INR 1.0 1.1   Cardiac Enzymes: No results for input(s): CKTOTAL, CKMB, CKMBINDEX, TROPONINI in the last 168 hours. BNP (last 3 results) No results for input(s): PROBNP in the last 8760 hours. HbA1C: No results for input(s): HGBA1C in the last 72 hours. CBG: Recent Labs  Lab 03/28/20 1956 03/28/20 2351 03/29/20 0355  03/29/20 0748 03/29/20 0821  GLUCAP 184* 148* 105* 142* 210*   Lipid Profile: No results for input(s): CHOL, HDL, LDLCALC, TRIG, CHOLHDL, LDLDIRECT in the last 72 hours. Thyroid Function Tests: No results for input(s): TSH, T4TOTAL, FREET4, T3FREE, THYROIDAB in the last 72 hours. Anemia Panel: No results for input(s): VITAMINB12, FOLATE, FERRITIN, TIBC, IRON, RETICCTPCT in the last 72 hours. Sepsis Labs: Recent Labs  Lab 03/25/20 0140 03/25/20 0359 03/25/20 0838 03/25/20 1144  LATICACIDVEN 2.1* 3.0* 2.0* 1.6    Recent Results (from the past 240 hour(s))  Culture, blood (Routine x 2)     Status: Abnormal   Collection Time: 03/24/20  6:20 PM   Specimen: Left Antecubital; Blood  Result Value Ref Range Status   Specimen Description   Final    LEFT ANTECUBITAL Performed at [facility], [address] [place], [address] [place]    Special Requests   Final    BOTTLES DRAWN AEROBIC AND ANAEROBIC Blood Culture adequate volume Performed at [facility], [address], [place], [address] [place]    Culture  Setup Time  Final    GRAM NEGATIVE RODS IN BOTH AEROBIC AND ANAEROBIC BOTTLES Gram Stain Report Called to,Read Back By and Verified With: BAINE C. @ [place] @ 0852 ON [id number] BY HENDERSON L CRITICAL RESULT CALLED TO, READ BACK BY AND VERIFIED WITH: D. PIERCE, PHARMD AT 1354 ON 03/25/20 BY C. JESSUP, MT.    Culture (A)  Final    ESCHERICHIA COLI SUSCEPTIBILITIES PERFORMED ON PREVIOUS CULTURE WITHIN THE LAST 5 DAYS. Performed at [facility], [address], [place], [place] [address]    Report Status 03/27/2020 FINAL  Final  Culture, blood (Routine x 2)     Status: Abnormal   Collection Time: 03/24/20  7:01 PM   Specimen: Left Antecubital; Blood  Result Value Ref Range Status   Specimen Description   Final    LEFT ANTECUBITAL Performed at [facility], [address] [place], [place] [address]    Special Requests   Final    BOTTLES DRAWN AEROBIC AND ANAEROBIC  Blood Culture adequate volume Performed at [facility], [address] [place], [place] [address]    Culture  Setup Time   Final    GRAM NEGATIVE RODS IN BOTH AEROBIC AND ANAEROBIC BOTTLES Gram Stain Report Called to,Read Back By and Verified With: BAINE C. @ [place] @ 0852 ON [id number] BY HENDERSON L CRITICAL RESULT CALLED TO, READ BACK BY AND VERIFIED WITH: D. PIERCE, PHARMD AT 1354 ON 03/25/20 BY C. JESSUP, MT. Performed at [facility], [address] [place], [place] [address]    Culture ESCHERICHIA COLI (A)  Final   Report Status 03/27/2020 FINAL  Final   Organism ID, Bacteria ESCHERICHIA COLI  Final      Susceptibility   Escherichia coli - MIC*    AMPICILLIN 4 SENSITIVE Sensitive     CEFAZOLIN <=4 SENSITIVE Sensitive     CEFEPIME <=0.12 SENSITIVE Sensitive     CEFTAZIDIME <=1 SENSITIVE Sensitive     CEFTRIAXONE <=0.25 SENSITIVE Sensitive     CIPROFLOXACIN <=0.25 SENSITIVE Sensitive     GENTAMICIN <=1 SENSITIVE Sensitive     IMIPENEM <=0.25 SENSITIVE Sensitive     TRIMETH/SULFA <=20 SENSITIVE Sensitive     AMPICILLIN/SULBACTAM <=2 SENSITIVE Sensitive     PIP/TAZO <=4 SENSITIVE Sensitive     * ESCHERICHIA COLI  Blood Culture ID Panel (Reflexed)     Status: Abnormal   Collection Time: 03/24/20  7:01 PM  Result Value Ref Range Status   Enterococcus species NOT DETECTED NOT DETECTED Final   Listeria monocytogenes NOT DETECTED NOT DETECTED Final   Staphylococcus species NOT DETECTED NOT DETECTED Final   Staphylococcus aureus (BCID) NOT DETECTED NOT DETECTED Final   Streptococcus species NOT DETECTED NOT DETECTED Final   Streptococcus agalactiae NOT DETECTED NOT DETECTED Final   Streptococcus pneumoniae NOT DETECTED NOT DETECTED Final   Streptococcus pyogenes NOT DETECTED NOT DETECTED Final   Acinetobacter baumannii NOT DETECTED NOT DETECTED Final   Enterobacteriaceae species DETECTED (A) NOT DETECTED Final    Comment: Enterobacteriaceae represent a large family of  gram-negative bacteria, not a single organism. CRITICAL RESULT CALLED TO, READ BACK BY AND VERIFIED WITH: D. PIERCE, PHARMD AT 1354 ON 03/25/20 BY C. JESSUP, MT.    Enterobacter cloacae complex NOT DETECTED NOT DETECTED Final   Escherichia coli DETECTED (A) NOT DETECTED Final    Comment: CRITICAL RESULT CALLED TO, READ BACK BY AND VERIFIED WITH: D. PIERCE, PHARMD AT 1354 ON 03/25/20 BY C. JESSUP, MT.  Klebsiella oxytoca NOT DETECTED NOT DETECTED Final   Klebsiella pneumoniae NOT DETECTED NOT DETECTED Final   Proteus species NOT DETECTED NOT DETECTED Final   Serratia marcescens NOT DETECTED NOT DETECTED Final   Carbapenem resistance NOT DETECTED NOT DETECTED Final   Haemophilus influenzae NOT DETECTED NOT DETECTED Final   Neisseria meningitidis NOT DETECTED NOT DETECTED Final   Pseudomonas aeruginosa NOT DETECTED NOT DETECTED Final   Candida albicans NOT DETECTED NOT DETECTED Final   Candida glabrata NOT DETECTED NOT DETECTED Final   Candida krusei NOT DETECTED NOT DETECTED Final   Candida parapsilosis NOT DETECTED NOT DETECTED Final   Candida tropicalis NOT DETECTED NOT DETECTED Final    Comment: Performed at [facility], [place] [address] [place], [place] [address]  Respiratory Panel by RT PCR (Flu A&B, Covid) - Nasopharyngeal Swab     Status: None   Collection Time: 03/24/20  7:10 PM   Specimen: Nasopharyngeal Swab  Result Value Ref Range Status   SARS Coronavirus 2 by RT PCR NEGATIVE NEGATIVE Final    Comment: (NOTE) SARS-CoV-2 target nucleic acids are NOT DETECTED. The SARS-CoV-2 RNA is generally detectable in upper respiratoy specimens during the acute phase of infection. The lowest concentration of SARS-CoV-2 viral copies this assay can detect is 131 copies/mL. A negative result does not preclude SARS-Cov-2 infection and should not be used as the sole basis for treatment or other patient management decisions. A negative result may occur with  improper specimen  collection/handling, submission of specimen other than nasopharyngeal swab, presence of viral mutation(s) within the areas targeted by this assay, and inadequate number of viral copies (<131 copies/mL). A negative result must be combined with clinical observations, patient history, and epidemiological information. The expected result is Negative. Fact Sheet for Patients:  PinkCheek.be Fact Sheet for Healthcare Providers:  GravelBags.it This test is not yet ap proved or cleared by the [place] FDA and  has been authorized for detection and/or diagnosis of SARS-CoV-2 by FDA under an Emergency Use Authorization (EUA). This EUA will remain  in effect (meaning this test can be used) for the duration of the COVID-19 declaration under Section 564(b)(1) of the Act, 21 U.S.C. section 360bbb-3(b)(1), unless the authorization is terminated or revoked sooner.    Influenza A by PCR NEGATIVE NEGATIVE Final   Influenza B by PCR NEGATIVE NEGATIVE Final    Comment: (NOTE) The Xpert Xpress SARS-CoV-2/FLU/RSV assay is intended as an aid in  the diagnosis of influenza from Nasopharyngeal swab specimens and  should not be used as a sole basis for treatment. Nasal washings and  aspirates are unacceptable for Xpert Xpress SARS-CoV-2/FLU/RSV  testing. Fact Sheet for Patients: PinkCheek.be Fact Sheet for Healthcare Providers: GravelBags.it This test is not yet approved or cleared by the [place] FDA and  has been authorized for detection and/or diagnosis of SARS-CoV-2 by  FDA under an Emergency Use Authorization (EUA). This EUA will remain  in effect (meaning this test can be used) for the duration of the  Covid-19 declaration under Section 564(b)(1) of the Act, 21  U.S.C. section 360bbb-3(b)(1), unless the authorization is  terminated or revoked. Performed at [facility],  [address] [place],  [address]          Radiology Studies: [facility] 1 View  Result Date: 03/28/2020 CLINICAL DATA:  Shortness of breath, cough. EXAM: PORTABLE CHEST 1 VIEW COMPARISON:  CT chest and chest radiograph 03/26/2020. FINDINGS: Trachea is midline. Heart is enlarged, stable. Minimal  right basilar atelectasis or scarring. Thickening of the minor fissure. Lungs are otherwise clear. No pleural fluid. IMPRESSION: No acute findings. Electronically Signed   By: Leanna Battles M.D.   On: 03/28/2020 10:26   ECHOCARDIOGRAM COMPLETE  Result Date: 03/27/2020    ECHOCARDIOGRAM REPORT   Patient Name:   Vincent Collins Date of Exam: 03/27/2020 Medical Rec #:  [id number]     Height:       68.0 in Accession #:    [id number]    Weight:       242.5 lb Date of Birth:  Jul 24, 1941     BSA:          2.218 m Patient Age:    78 years      BP:           157/76 mmHg Patient Gender: M             HR:           105 bpm. Exam Location:  Inpatient Procedure: 2D Echo, Color Doppler and Cardiac Doppler Indications:    I48.91* Unspecified atrial fibrillation  History:        Patient has prior history of Echocardiogram examinations, most                 recent 04/04/2009. COPD; Risk Factors:Hypertension, Diabetes and                 Dyslipidemia.  Sonographer:    Irving Burton Senior RDCS Referring Phys: [id number] SYLVESTER I OGBATA  Sonographer Comments: Technically difficult study due to patient body habitus and respiratory variation. IMPRESSIONS  1. Left ventricular ejection fraction, by estimation, is 65 to 70%. The left ventricle has normal function. The left ventricle has no regional wall motion abnormalities. There is moderate left ventricular hypertrophy. Left ventricular diastolic function  could not be evaluated.  2. Right ventricular systolic function is normal. The right ventricular size is normal.  3. Left atrial size was severely dilated.  4. The mitral valve was not well visualized. No evidence of mitral valve  regurgitation.  5. The aortic valve was not well visualized. Aortic valve regurgitation is not visualized.  6. Aortic dilatation noted. 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  7. The inferior vena cava is normal in size with greater than 50% respiratory variability, suggesting right atrial pressure of 3 mmHg. FINDINGS  Left Ventricle: Left ventricular ejection fraction, by estimation, is 65 to 70%. The left ventricle has normal function. The left ventricle has no regional wall motion abnormalities. The left ventricular internal cavity size was normal in size. There is  moderate left ventricular hypertrophy. Left ventricular diastolic function could not be evaluated due to atrial fibrillation. Left ventricular diastolic function could not be evaluated. Right Ventricle: The right ventricular size is normal. No increase in right ventricular wall thickness. Right ventricular systolic function is normal. Left Atrium: Left atrial size was severely dilated. Right Atrium: Right atrial size was normal in size. Pericardium: There is no evidence of pericardial effusion. Mitral Valve: The mitral valve was not well visualized. No evidence of mitral valve regurgitation. Tricuspid Valve: The tricuspid valve is not well visualized. Tricuspid valve regurgitation is trivial. Aortic Valve: The aortic valve was not well visualized. Aortic valve regurgitation is not visualized. Pulmonic Valve: The pulmonic valve was not well visualized. Pulmonic valve regurgitation is not visualized. Aorta: Aortic dilatation noted. 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 Venous: The inferior  vena cava is normal in size with greater than 50% respiratory variability, suggesting right atrial pressure of 3 mmHg. IAS/Shunts: No atrial level shunt detected by color flow Doppler.  LEFT VENTRICLE PLAX 2D LVIDd:         5.50 cm LVIDs:         3.40 cm LV PW:         1.20 cm LV IVS:        1.60 cm LVOT  diam:     2.30 cm LV SV:         83 LV SV Index:   37 LVOT Area:     4.15 cm  RIGHT VENTRICLE RV S prime:     22.80 cm/s TAPSE (M-mode): 2.5 cm LEFT ATRIUM              Index       RIGHT ATRIUM           Index LA diam:        3.90 cm  1.76 cm/m  RA Area:     20.80 cm LA Vol (A2C):   100.3 ml 45.22 ml/m RA Volume:   56.80 ml  25.61 ml/m LA Vol (A4C):   118.0 ml 53.20 ml/m LA Biplane Vol: 120.0 ml 54.10 ml/m  AORTIC VALVE LVOT Vmax:   97.50 cm/s LVOT Vmean:  71.000 cm/s LVOT VTI:    0.199 m                          PULMONARY ARTERY AORTA                    MPA diam:        3.10 cm Ao Root diam: 3.90 cm Ao Asc diam:  4.40 cm  SHUNTS Systemic VTI:  0.20 m Systemic Diam: 2.30 cm Zoila ShutterKenneth Hilty MD Electronically signed by Zoila ShutterKenneth Hilty MD Signature Date/Time: 03/27/2020/3:35:16 PM    Final         Scheduled Meds: . fluticasone  2 spray Each Nare Daily  . insulin aspart  0-9 Units Subcutaneous Q4H  . ipratropium  0.5 mg Nebulization TID  . levalbuterol  0.63 mg Nebulization TID  . lidocaine  2 patch Transdermal Q24H  . lisinopril  20 mg Oral Daily  . metoprolol tartrate  25 mg Oral BID  . ondansetron  4 mg Oral Once  . pantoprazole (PROTONIX) IV  40 mg Intravenous Q12H  . potassium chloride  40 mEq Oral Q4H  . sodium chloride flush  3 mL Intravenous Q12H  . tamsulosin  0.8 mg Oral QPC supper   Continuous Infusions: . amiodarone 30 mg/hr (03/29/20 0836)  . ampicillin-sulbactam (UNASYN) IV 3 g (03/29/20 0500)  . heparin 1,350 Units/hr (03/29/20 0012)     LOS: 4 days   Time spent= 35 mins    Aiyonna Lucado Joline Maxcyhirag Tifini Reeder, MD [facility]  If 7PM-7AM, please contact night-coverage  03/29/2020, 11:19 AM

## 2020-03-29 NOTE — Progress Notes (Signed)
RN was informed by Monitor tech that pt had 9 beats of VTach. I awoke pt. Pt denied SOB, CP, palpitation. VSS. Dr. Daphine Deutscher was made aware cont. To monitor.

## 2020-03-30 LAB — CBC
HCT: 44.7 % (ref 39.0–52.0)
Hemoglobin: 15.2 g/dL (ref 13.0–17.0)
MCH: 29.9 pg (ref 26.0–34.0)
MCHC: 34 g/dL (ref 30.0–36.0)
MCV: 87.8 fL (ref 80.0–100.0)
Platelets: 189 10*3/uL (ref 150–400)
RBC: 5.09 MIL/uL (ref 4.22–5.81)
RDW: 13.9 % (ref 11.5–15.5)
WBC: 9.4 10*3/uL (ref 4.0–10.5)
nRBC: 0 % (ref 0.0–0.2)

## 2020-03-30 LAB — COMPREHENSIVE METABOLIC PANEL
ALT: 74 U/L — ABNORMAL HIGH (ref 0–44)
AST: 61 U/L — ABNORMAL HIGH (ref 15–41)
Albumin: 2.6 g/dL — ABNORMAL LOW (ref 3.5–5.0)
Alkaline Phosphatase: 129 U/L — ABNORMAL HIGH (ref 38–126)
Anion gap: 14 (ref 5–15)
BUN: 8 mg/dL (ref 8–23)
CO2: 26 mmol/L (ref 22–32)
Calcium: 8.4 mg/dL — ABNORMAL LOW (ref 8.9–10.3)
Chloride: 96 mmol/L — ABNORMAL LOW (ref 98–111)
Creatinine, Ser: 1.04 mg/dL (ref 0.61–1.24)
GFR calc Af Amer: >60 mL/min (ref >60)
GFR calc non Af Amer: >60 mL/min (ref >60)
Glucose, Bld: 179 mg/dL — ABNORMAL HIGH (ref 70–99)
Potassium: 3.3 mmol/L — ABNORMAL LOW (ref 3.5–5.1)
Sodium: 136 mmol/L (ref 135–145)
Total Bilirubin: 1.2 mg/dL (ref 0.3–1.2)
Total Protein: 5.7 g/dL — ABNORMAL LOW (ref 6.5–8.1)

## 2020-03-30 LAB — MAGNESIUM: Magnesium: 1.9 mg/dL (ref 1.7–2.4)

## 2020-03-30 LAB — GLUCOSE, CAPILLARY
Glucose-Capillary: 120 mg/dL — ABNORMAL HIGH (ref 70–99)
Glucose-Capillary: 139 mg/dL — ABNORMAL HIGH (ref 70–99)
Glucose-Capillary: 175 mg/dL — ABNORMAL HIGH (ref 70–99)

## 2020-03-30 MED ORDER — AMOXICILLIN-POT CLAVULANATE 875-125 MG PO TABS: 1.0000 | ORAL_TABLET | Freq: Two times a day (BID) | ORAL | 0 refills | Status: AC

## 2020-03-30 MED ORDER — AMIODARONE HCL 200 MG PO TABS: 400.0000 mg | ORAL_TABLET | Freq: Once | ORAL | Status: DC

## 2020-03-30 MED ORDER — POTASSIUM CHLORIDE CRYS ER 20 MEQ PO TBCR
40.0000 meq | EXTENDED_RELEASE_TABLET | ORAL | Status: DC
  Administered 2020-03-30: 09:00:00 40 meq via ORAL
  Filled 2020-03-30: qty 2

## 2020-03-30 MED ORDER — AMIODARONE HCL 200 MG PO TABS: ORAL_TABLET | ORAL | 0 refills | Status: DC

## 2020-03-30 MED ORDER — PANTOPRAZOLE SODIUM 40 MG PO TBEC: 40.0000 mg | DELAYED_RELEASE_TABLET | Freq: Every day | ORAL | 0 refills | Status: DC

## 2020-03-30 MED ORDER — APIXABAN 5 MG PO TABS: 5.0000 mg | ORAL_TABLET | Freq: Two times a day (BID) | ORAL | 0 refills | Status: DC

## 2020-03-30 MED ORDER — LEVALBUTEROL HCL 0.63 MG/3ML IN NEBU: 0.6300 mg | INHALATION_SOLUTION | Freq: Four times a day (QID) | RESPIRATORY_TRACT | Status: DC | PRN

## 2020-03-30 MED ORDER — IPRATROPIUM BROMIDE 0.02 % IN SOLN: 0.5000 mg | Freq: Four times a day (QID) | RESPIRATORY_TRACT | Status: DC | PRN

## 2020-03-30 MED ORDER — PANTOPRAZOLE SODIUM 40 MG PO TBEC
40.0000 mg | DELAYED_RELEASE_TABLET | Freq: Two times a day (BID) | ORAL | Status: DC
  Administered 2020-03-30: 09:00:00 40 mg via ORAL
  Filled 2020-03-30: qty 1

## 2020-03-30 MED FILL — ELIQUIS 5 MG TABLET: 5 | 30 days supply | Qty: 60 | Fill #0

## 2020-03-30 MED FILL — PANTOPRAZOLE SOD DR 40 MG T: 40 | 60 days supply | Qty: 60 | Fill #0

## 2020-03-30 MED FILL — AMIODARONE HCL 200 MG TAB: 200 | 28 days supply | Qty: 49 | Fill #0

## 2020-03-30 MED FILL — AMOX-CLAV 875-125 MG TABLET: 875-125 | 5 days supply | Qty: 10 | Fill #0

## 2020-03-30 NOTE — Progress Notes (Signed)
Cardiology Progress Note  Patient ID: Vincent Collins MRN: [id number] DOB: 1941/02/27 Date of Encounter: 03/30/2020  Primary Cardiologist: No primary care provider on file.  Subjective  In and out of Afib. NSR at time of my exam. Home today.   ROS:  All other ROS reviewed and negative. Pertinent positives noted in the HPI.     Inpatient Medications  Scheduled Meds: . amiodarone  400 mg Oral Once  . apixaban  5 mg Oral BID  . fluticasone  2 spray Each Nare Daily  . insulin aspart  0-9 Units Subcutaneous Q4H  . lidocaine  2 patch Transdermal Q24H  . lisinopril  20 mg Oral Daily  . metoprolol tartrate  25 mg Oral BID  . ondansetron  4 mg Oral Once  . pantoprazole  40 mg Oral BID AC  . potassium chloride  40 mEq Oral Q4H  . sodium chloride flush  3 mL Intravenous Q12H  . tamsulosin  0.8 mg Oral QPC supper   Continuous Infusions: . ampicillin-sulbactam (UNASYN) IV 3 g (03/30/20 0634)   PRN Meds: acetaminophen **OR** acetaminophen, benzonatate, fluticasone, guaiFENesin, ipratropium, levalbuterol, morphine injection, ondansetron (ZOFRAN) IV   Vital Signs   Vitals:   03/30/20 0426 03/30/20 0758 03/30/20 0822 03/30/20 0846  BP: (!) 156/86 (!) 159/76  132/67  Pulse: 95 96    Resp: 16 20    Temp: 97.6 F (36.4 C) (!) 97.5 F (36.4 C)    TempSrc: Oral Oral    SpO2: 92% 95% 96%   Weight:      Height:        Intake/Output Summary (Last 24 hours) at 03/30/2020 1321 Last data filed at 03/30/2020 1243 Gross per 24 hour  Intake 1245.71 ml  Output 1300 ml  Net -54.29 ml   Last 3 Weights 03/30/2020 03/29/2020 03/28/2020  Weight (lbs) 249 lb 9.6 oz 245 lb 11.2 oz 245 lb  Weight (kg) 113.218 kg 111.449 kg 111.131 kg      Telemetry  Overnight telemetry shows NSR with intermittent Afib, which I personally reviewed.   ECG  The most recent ECG shows normal sinus rhythm, no acute ischemic changes, questionable inferior infarct, PVCs noted, which I personally reviewed.   Physical Exam    Vitals:   03/30/20 0426 03/30/20 0758 03/30/20 0822 03/30/20 0846  BP: (!) 156/86 (!) 159/76  132/67  Pulse: 95 96    Resp: 16 20    Temp: 97.6 F (36.4 C) (!) 97.5 F (36.4 C)    TempSrc: Oral Oral    SpO2: 92% 95% 96%   Weight:      Height:         Intake/Output Summary (Last 24 hours) at 03/30/2020 1321 Last data filed at 03/30/2020 1243 Gross per 24 hour  Intake 1245.71 ml  Output 1300 ml  Net -54.29 ml    Last 3 Weights 03/30/2020 03/29/2020 03/28/2020  Weight (lbs) 249 lb 9.6 oz 245 lb 11.2 oz 245 lb  Weight (kg) 113.218 kg 111.449 kg 111.131 kg    Body mass index is 37.95 kg/m.   General: Well nourished, well developed, tachypnea noted Head: Atraumatic, normal size  Eyes: PEERLA, EOMI  Neck: Supple, no JVD Endocrine: No thryomegaly Cardiac: Normal S1, S2; RRR; no murmurs, rubs, or gallops Lungs: Clear to auscultation bilaterally, no wheezing, rhonchi or rales  Abd: Soft, nontender, no hepatomegaly  Ext: No edema, pulses 2+ Musculoskeletal: No deformities, BUE and BLE strength normal and equal Skin: Warm and dry, no  rashes   Neuro: Alert and oriented to person, place, time, and situation, CNII-XII grossly intact, no focal deficits  Psych: Normal mood and affect   Labs  High Sensitivity Troponin:   Recent Labs  Lab 03/26/20 1517 03/26/20 1840 03/27/20 1032 03/27/20 1311  TROPONINIHS 34* 38* 27* 22*     Cardiac EnzymesNo results for input(s): TROPONINI in the last 168 hours. No results for input(s): TROPIPOC in the last 168 hours.  Chemistry Recent Labs  Lab 03/28/20 0344 03/29/20 0438 03/30/20 0506  NA 133* 138 136  K 3.6 3.2* 3.3*  CL 97* 97* 96*  CO2 24 28 26   GLUCOSE 143* 126* 179*  BUN 13 11 8   CREATININE 0.89 1.05 1.04  CALCIUM 7.9* 8.3* 8.4*  PROT 5.1* 5.4* 5.7*  ALBUMIN 2.4* 2.6* 2.6*  AST 28 41 61*  ALT 81* 73* 74*  ALKPHOS 138* 133* 129*  BILITOT 1.8* 1.8* 1.2  GFRNONAA >60 >60 >60  GFRAA >60 >60 >60  ANIONGAP 12 13 14      Hematology Recent Labs  Lab 03/28/20 0344 03/29/20 0438 03/30/20 0506  WBC 12.2* 11.4* 9.4  RBC 4.87 4.96 5.09  HGB 14.6 15.1 15.2  HCT 42.9 43.5 44.7  MCV 88.1 87.7 87.8  MCH 30.0 30.4 29.9  MCHC 34.0 34.7 34.0  RDW 13.5 13.6 13.9  PLT 147* 174 189   BNP Recent Labs  Lab 03/26/20 0256 03/28/20 0344  BNP 230.0* 187.9*    DDimer  Recent Labs  Lab 03/26/20 1517  DDIMER 6.11*     Radiology  No results found.  TTE 03/27/2020 1. Left ventricular ejection fraction, by estimation, is 65 to 70%. The  left ventricle has normal function. The left ventricle has no regional  wall motion abnormalities. There is moderate left ventricular hypertrophy.  Left ventricular diastolic function  could not be evaluated.  2. Right ventricular systolic function is normal. The right ventricular  size is normal.  3. Left atrial size was severely dilated.  4. The mitral valve was not well visualized. No evidence of mitral valve  regurgitation.  5. The aortic valve was not well visualized. Aortic valve regurgitation  is not visualized.  6. Aortic dilatation noted. There is moderate dilatation of the ascending  aorta measuring 44 mm.  7. The inferior vena cava is normal in size with greater than 50%  respiratory variability, suggesting right atrial pressure of 3 mmHg.   Patient Profile  Mr. Herrington is a 79 yo M with COPD, HTN, DM who was admitted for acute pancreatitis. 03/26/2020 he developed Afib with RVR and was given metoprolol 25 mg po with conversion back to NSR. On 03/27/2020 overnight he converted back to Afib.   Assessment & Plan   1.  New onset atrial fibrillation with RVR -Secondary to acute pancreatitis. EKG without acute ischemic changes. Troponin mildly elevated and flat.  -continue amio load (400 mg BID x 7 days) followed by 200 mg QD for 21 days  -metoprolol succinate 25 mg QD -continue eliquis 5 mg BID -he will follow-up with me at discharge -TTE normal -no plans  for surgery this admission   2.  Chest pain -Troponin flat. In epigastric area. Echo without WMA.   3. SOB -better after diuresis. Likely volume up due to IVF from pancreatitis   For questions or updates, please contact [facility] Please consult [email or web address] for contact info under   Time Spent with Patient: I have spent a total of 25 minutes with patient reviewing  hospital notes, telemetry, EKGs, labs and examining the patient as well as establishing an assessment and plan that was discussed with the patient.  > 50% of time was spent in direct patient care.    Signed, Lenna Gilford. Flora Lipps, MD [place]  [facility]  03/30/2020 1:21 PM

## 2020-03-30 NOTE — Discharge Summary (Signed)
Physician Discharge Summary  Vincent EaringJames D Achor ZOX:[id number] DOB: 04-Sep-1941 DOA: 03/24/2020  PCP: Dettinger, Elige RadonJoshua A, MD  Admit date: 03/24/2020 Discharge date: 03/30/2020  Admitted From: Home Disposition: Home  Recommendations for Outpatient Follow-up:  1. Follow up with PCP in 1-2 weeks 2. Please obtain BMP/CBC in one week your next doctors visit.  3. Follow-up outpatient cardiology as recommended 4. Amiodarone 400 mg twice daily for 7 days followed by 200 mg daily for 21 days 5. Eliquis 5 mg twice daily 6. Augmentin orally twice daily for 5 days  Discharge Condition: Stable CODE STATUS: Full Diet recommendation: GI soft/heart healthy  Brief/Interim Summary: 79 year old with history of COPD, DM 2, HTN, HLD, left-sided hydronephrosis, diverticulitis currently admitted for acute pancreatitis and sepsis secondary to E. coli bacteremia with possible acute cholangitis.  Hospital course complicated by atrial fibrillation with RVR.  Patient's pancreatitis was conservatively managed, remain on IV Unasyn.  His diet was slowly advanced.  For his A. fib he required amiodarone and heparin drip which was managed by cardiology team.  Eventually patient was transitioned to oral amiodarone along with Eliquis.  He was discharged home with above recommendations.   Acute pancreatitis, unclear etiology Acalculus cholecystitis -Improving with conservative management, continue Unasyn.  Discharged on 5 more days of oral Augmentin -Tolerating oral diet without any issue -MRCP showed acute necrotizing pancreatitis, cholecystitis without stones  Atrial fibrillation with RVR, now normal sinus rhythm -Continue his home Toprol-XL -Transition amiodarone drip to p.o. amiodarone 400 mg twice daily for 7 days followed by 200 mg daily until seen by outpatient cardiology in 1 month -Oral Eliquis 5 mg twice daily -Replete electrolytes as appropriate -Echocardiogram-EF 65-70%.  E. coli bacteremia -Pansensitive.   Continue Unasyn, switched to oral Augmentin for 5 more days  Mild to moderate shortness of breath with orthopnea and cough, improved Resolved  Essential hypertension, uncontrolled -Resume lisinopril 20 mg daily  Hyperlipidemia Resume, lab work check with outpatient PCP during the visit  DM2 Resume home Metformin  BPH -Flomax  Discharge Diagnoses:  Principal Problem:   Cholangitis Active Problems:   Essential hypertension   COPD (chronic obstructive pulmonary disease) (HCC)   Type 2 diabetes mellitus [facility](HCC)    Consultations:  Cardiology  Gastroenterology  General surgery  Subjective: Feels great, no complaints.  He is eager to go home.  Discharge Exam: Vitals:   03/30/20 0822 03/30/20 0846  BP:  132/67  Pulse:    Resp:    Temp:    SpO2: 96%    Vitals:   03/30/20 0426 03/30/20 0758 03/30/20 0822 03/30/20 0846  BP: (!) 156/86 (!) 159/76  132/67  Pulse: 95 96    Resp: 16 20    Temp: 97.6 F (36.4 C) (!) 97.5 F (36.4 C)    TempSrc: Oral Oral    SpO2: 92% 95% 96%   Weight:      Height:        General: Pt is alert, awake, not in acute distress Cardiovascular: RRR, S1/S2 +, no rubs, no gallops Respiratory: CTA bilaterally, no wheezing, no rhonchi Abdominal: Soft, NT, ND, bowel sounds + Extremities: no edema, no cyanosis  Discharge Instructions   Allergies as of 03/30/2020      Reactions   Diphenhydramine Hcl Other (See Comments)   Can tolerate      Medication List    TAKE these medications   albuterol 108 (90 Base) MCG/ACT inhaler Commonly known as: ProAir HFA USE 2 PUFFS EVERY 6 HOURS  AS NEEDED FOR  WHEEZING What changed:   how much to take  how to take this  when to take this  reasons to take this  additional instructions   albuterol (2.5 MG/3ML) 0.083% nebulizer solution Commonly known as: PROVENTIL Take 3 mLs (2.5 mg total) by nebulization 4 (four) times daily as needed for wheezing or shortness of breath. What changed:  Another medication with the same name was changed. Make sure you understand how and when to take each.   amiodarone 200 MG tablet Commonly known as: PACERONE Take 2 tablets (400 mg total) by mouth 2 (two) times daily for 7 days, THEN 1 tablet (200 mg total) daily for 21 days. Start taking on: March 30, 2020   amoxicillin-clavulanate 875-125 MG tablet Commonly known as: Augmentin Take 1 tablet by mouth every 12 (twelve) hours for 5 days.   apixaban 5 MG Tabs tablet Commonly known as: ELIQUIS Take 1 tablet (5 mg total) by mouth 2 (two) times daily.   aspirin 81 MG EC tablet Take 81 mg by mouth every evening.   atorvastatin 40 MG tablet Commonly known as: LIPITOR TAKE 1 TABLET BY MOUTH  DAILY What changed: when to take this   CO-Q 10 Omega-3 Fish Oil Caps Take 1 capsule by mouth in the morning. 100mg  Capsule Qunol   fluticasone 50 MCG/ACT nasal spray Commonly known as: FLONASE USE 1 SPRAY INTO BOTH  NOSTRILS 2 TIMES DAILY AS  NEEDED FOR ALLERGIES OR  RHINITIS What changed: See the new instructions.   lisinopril 20 MG tablet Commonly known as: ZESTRIL TAKE 1 TABLET BY MOUTH  DAILY What changed: when to take this   metFORMIN 500 MG 24 hr tablet Commonly known as: GLUCOPHAGE-XR TAKE 1 TABLET BY MOUTH TWO  TIMES DAILY What changed: when to take this   metoprolol succinate 50 MG 24 hr tablet Commonly known as: TOPROL-XL TAKE 1 TABLET BY MOUTH  DAILY WITH OR IMMEDIATELY  FOLLOWING A MEAL What changed: See the new instructions.   omeprazole 20 MG capsule Commonly known as: PRILOSEC TAKE 1 CAPSULE BY MOUTH  DAILY   tamsulosin 0.4 MG Caps capsule Commonly known as: FLOMAX TAKE 2 CAPSULES BY MOUTH  DAILY AFTER SUPPER What changed: See the new instructions.   Vitamin D 1000 units capsule Take 1,000 Units by mouth in the morning.      Follow-up Information    Abigail Miyamoto, MD. Schedule an appointment as soon as possible for a visit.   Specialty: General  Surgery Why: Call to make an appointment for follow up Contact information: [address] [place] [address] [phone]        Sande Rives, MD Follow up on 04/30/2020.   Specialties: Internal Medicine, Cardiology, Radiology Why: Please arrive 15 minutes early for your 11:20am post hospital cardiology appointment Contact information: [address] [place] [address] [phone]        Dettinger, Elige Radon, MD.   Specialties: Family Medicine, Cardiology Why: Please follow up in a week Contact information: [address] [place] [address] [phone]          Allergies  Allergen Reactions  . Diphenhydramine Hcl Other (See Comments)    Can tolerate    You were cared for by a hospitalist during your hospital stay. If you have any questions about your discharge medications or the care you received while you were in the hospital after you are discharged, you can call the unit and asked to speak with the  hospitalist on call if the hospitalist that took care of you is not available. Once you are discharged, your primary care physician will handle any further medical issues. Please note that no refills for any discharge medications will be authorized once you are discharged, as it is imperative that you return to your primary care physician (or establish a relationship with a primary care physician if you do not have one) for your aftercare needs so that they can reassess your need for medications and monitor your lab values.   Procedures/Studies: CT ANGIO CHEST PE W OR WO CONTRAST  Result Date: 03/26/2020 CLINICAL DATA:  Shortness of breath EXAM: CT ANGIOGRAPHY CHEST WITH CONTRAST TECHNIQUE: Multidetector CT imaging of the chest was performed using the standard protocol during bolus administration of intravenous contrast. Multiplanar CT image reconstructions and MIPs were obtained to evaluate the vascular anatomy. CONTRAST:  148mL OMNIPAQUE IOHEXOL  350 MG/ML SOLN COMPARISON:  Plain film from earlier in the same day, MRI from the previous day. FINDINGS: Cardiovascular: Thoracic aorta and its branches demonstrate atherosclerotic calcifications without aneurysmal dilatation. No dissection is seen. No cardiac enlargement is seen. Minimal pericardial fluid is noted. Coronary calcifications are seen. The pulmonary artery shows a normal branching pattern. No definitive filling defects to suggest pulmonary emboli are noted. Mediastinum/Nodes: Thoracic inlet is within normal limits. No hilar or mediastinal adenopathy is noted. The esophagus as visualized is within normal limits. Lungs/Pleura: Considerable motion artifact is noted. The left lung is well aerated without focal infiltrate or sizable effusion. Small right-sided pleural effusion and right lower lobe atelectatic changes are seen. No focal confluent infiltrate is noted. No parenchymal nodules are seen. Upper Abdomen: Visualized upper abdomen again demonstrates some mild peripancreatic inflammatory change consistent with the known history of pancreatitis. Renal cysts are seen. Musculoskeletal: Degenerative changes of the thoracic spine are seen. Healing rib fractures are noted on the right involving the sixth through eighth ribs anteriorly. No definitive left rib fractures are seen. No compression deformities are seen. Review of the MIP images confirms the above findings. IMPRESSION: Multiple right-sided rib fractures with healing. A small right effusion is noted which is likely compensatory in nature. Mild associated atelectasis is noted. No pneumothorax is seen. Mild changes of pancreatitis similar to that noted on prior MRI from the previous day. Aortic Atherosclerosis (ICD10-I70.0). Electronically Signed   By: Inez Catalina M.D.   On: 03/26/2020 20:51   DG Chest Port 1 View  Result Date: 03/28/2020 CLINICAL DATA:  Shortness of breath, cough. EXAM: PORTABLE CHEST 1 VIEW COMPARISON:  CT chest and chest  radiograph 03/26/2020. FINDINGS: Trachea is midline. Heart is enlarged, stable. Minimal right basilar atelectasis or scarring. Thickening of the minor fissure. Lungs are otherwise clear. No pleural fluid. IMPRESSION: No acute findings. Electronically Signed   By: Lorin Picket M.D.   On: 03/28/2020 10:26   DG CHEST PORT 1 VIEW  Result Date: 03/26/2020 CLINICAL DATA:  Short of breath, COPD, previous tobacco abuse, diabetes, hypertension EXAM: PORTABLE CHEST 1 VIEW COMPARISON:  03/26/2020 at 9:57 a.m. FINDINGS: Single frontal view of the chest demonstrates a stable cardiac silhouette. No airspace disease, effusion, or pneumothorax. Improved aeration at the lung bases. No acute bony abnormalities. IMPRESSION: 1. No acute process. Electronically Signed   By: Randa Ngo M.D.   On: 03/26/2020 18:42   DG CHEST PORT 1 VIEW  Result Date: 03/26/2020 CLINICAL DATA:  Shortness of breath with cough EXAM: PORTABLE CHEST 1 VIEW COMPARISON:  03/24/2020 FINDINGS: Low volume chest  with prominence of the cardiomediastinal silhouette. Mild atelectasis or scar like appearance at the bases. There is no edema, consolidation, effusion, or pneumothorax. No acute osseous finding. IMPRESSION: Minimal scarring or atelectasis at the bases. Electronically Signed   By: Marnee Spring M.D.   On: 03/26/2020 10:09   DG Chest Port 1 View  Result Date: 03/24/2020 CLINICAL DATA:  Sepsis. EXAM: PORTABLE CHEST 1 VIEW COMPARISON:  January 04, 2020 FINDINGS: Heart size is enlarged. There is no pneumothorax or pleural effusion. There are airspace opacities at the lung bases bilaterally favored to represent areas of scarring atelectasis. There is no acute osseous abnormality. IMPRESSION: 1. Cardiomegaly. 2. Bibasilar airspace opacities favored to represent scarring atelectasis. Electronically Signed   By: Katherine Mantle M.D.   On: 03/24/2020 18:50   MR ABDOMEN MRCP W WO CONTAST  Result Date: 03/25/2020 CLINICAL DATA:  Jaundice.  Abdominal pain, nausea and vomiting. Chills. COPD. EXAM: MRI ABDOMEN WITHOUT AND WITH CONTRAST (INCLUDING MRCP) TECHNIQUE: Multiplanar multisequence MR imaging of the abdomen was performed both before and after the administration of intravenous contrast. Heavily T2-weighted images of the biliary and pancreatic ducts were obtained, and three-dimensional MRCP images were rendered by post processing. CONTRAST:  10mL GADAVIST GADOBUTROL 1 MMOL/ML IV SOLN COMPARISON:  Abdominal sonogram from earlier today. 09/11/2014 CT abdomen/pelvis. FINDINGS: Lower chest: No acute abnormality at the lung bases. Hepatobiliary: Normal liver size and configuration. Mild diffuse hepatic steatosis. Scattered simple subcentimeter left liver lobe cysts. No suspicious liver masses. Moderate diffuse gallbladder wall thickening. No gallstones. Gallbladder is nondistended. No significant pericholecystic fluid. No biliary ductal dilatation. Common bile duct diameter 4 mm. No biliary filling defects to suggest choledocholithiasis. No biliary strictures, masses or beading. Pancreas: Diffuse pancreatic parenchymal and peripancreatic edema compatible with acute pancreatitis. No pancreatic duct dilation. No pancreas divisum. Preserved pancreatic parenchymal enhancement. Tiny nonenhancing 0.5 cm pancreatic tail cystic lesion compatible with a pseudocyst (series 18/image 11). No additional pancreatic lesions. No measurable peripancreatic fluid collections. Spleen: Normal size. No mass. Adrenals/Urinary Tract: Normal adrenals. No hydronephrosis. Exophytic minimally complex renal cysts in the upper left kidney measuring 1.7 cm medially (series 5/image 24) and 2.1 cm posteriorly (series 5/image 26), both demonstrating a tiny amount of layering hemorrhagic/proteinaceous material without enhancement, compatible with Bosniak category 2 hemorrhagic/proteinaceous renal cysts. Additional simple renal cysts scattered in both kidneys, largest 2.5 cm in the  interpolar right kidney. No suspicious renal masses. Stomach/Bowel: Normal non-distended stomach. Visualized small and large bowel is normal caliber, with no bowel wall thickening. Colonic diverticulosis. Vascular/Lymphatic: Atherosclerotic nonaneurysmal abdominal aorta. Patent portal, splenic, hepatic and renal veins. No pathologically enlarged lymph nodes in the abdomen. Other: No abdominal ascites or focal fluid collection. Musculoskeletal: No aggressive appearing focal osseous lesions. IMPRESSION: 1. Acute non-necrotizing pancreatitis. No cholelithiasis or choledocholithiasis. No biliary or pancreatic duct dilation. Tiny 0.5 cm pancreatic tail pseudocyst. No measurable peripancreatic fluid collections. 2. Moderate diffuse gallbladder wall thickening, nonspecific, probably reactive or due to noninflammatory edema. 3. Mild diffuse hepatic steatosis. 4. Colonic diverticulosis. 5. Bosniak category 1 and category 2 renal cysts. No suspicious renal masses. 6.  Aortic Atherosclerosis (ICD10-I70.0). Electronically Signed   By: Delbert Phenix M.D.   On: 03/25/2020 12:21   ECHOCARDIOGRAM COMPLETE  Result Date: 03/27/2020    ECHOCARDIOGRAM REPORT   Patient Name:   Vincent Collins Date of Exam: 03/27/2020 Medical Rec #:  [id number]     Height:       68.0 in Accession #:    [id number]  Weight:       242.5 lb Date of Birth:  1941/03/17     BSA:          2.218 m Patient Age:    78 years      BP:           157/76 mmHg Patient Gender: M             HR:           105 bpm. Exam Location:  Inpatient Procedure: 2D Echo, Color Doppler and Cardiac Doppler Indications:    I48.91* Unspecified atrial fibrillation  History:        Patient has prior history of Echocardiogram examinations, most                 recent 04/04/2009. COPD; Risk Factors:Hypertension, Diabetes and                 Dyslipidemia.  Sonographer:    Irving Burton Senior RDCS Referring Phys: [id number] SYLVESTER I OGBATA  Sonographer Comments: Technically difficult study due to patient  body habitus and respiratory variation. IMPRESSIONS  1. Left ventricular ejection fraction, by estimation, is 65 to 70%. The left ventricle has normal function. The left ventricle has no regional wall motion abnormalities. There is moderate left ventricular hypertrophy. Left ventricular diastolic function  could not be evaluated.  2. Right ventricular systolic function is normal. The right ventricular size is normal.  3. Left atrial size was severely dilated.  4. The mitral valve was not well visualized. No evidence of mitral valve regurgitation.  5. The aortic valve was not well visualized. Aortic valve regurgitation is not visualized.  6. Aortic dilatation noted. 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  7. The inferior vena cava is normal in size with greater than 50% respiratory variability, suggesting right atrial pressure of 3 mmHg. FINDINGS  Left Ventricle: Left ventricular ejection fraction, by estimation, is 65 to 70%. The left ventricle has normal function. The left ventricle has no regional wall motion abnormalities. The left ventricular internal cavity size was normal in size. There is  moderate left ventricular hypertrophy. Left ventricular diastolic function could not be evaluated due to atrial fibrillation. Left ventricular diastolic function could not be evaluated. Right Ventricle: The right ventricular size is normal. No increase in right ventricular wall thickness. Right ventricular systolic function is normal. Left Atrium: Left atrial size was severely dilated. Right Atrium: Right atrial size was normal in size. Pericardium: There is no evidence of pericardial effusion. Mitral Valve: The mitral valve was not well visualized. No evidence of mitral valve regurgitation. Tricuspid Valve: The tricuspid valve is not well visualized. Tricuspid valve regurgitation is trivial. Aortic Valve: The aortic valve was not well visualized. Aortic valve regurgitation is not visualized.  Pulmonic Valve: The pulmonic valve was not well visualized. Pulmonic valve regurgitation is not visualized. Aorta: Aortic dilatation noted. 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 Venous: The inferior vena cava is normal in size with greater than 50% respiratory variability, suggesting right atrial pressure of 3 mmHg. IAS/Shunts: No atrial level shunt detected by color flow Doppler.  LEFT VENTRICLE PLAX 2D LVIDd:         5.50 cm LVIDs:         3.40 cm LV PW:         1.20 cm LV IVS:        1.60 cm LVOT diam:     2.30 cm  LV SV:         83 LV SV Index:   37 LVOT Area:     4.15 cm  RIGHT VENTRICLE RV S prime:     22.80 cm/s TAPSE (M-mode): 2.5 cm LEFT ATRIUM              Index       RIGHT ATRIUM           Index LA diam:        3.90 cm  1.76 cm/m  RA Area:     20.80 cm LA Vol (A2C):   100.3 ml 45.22 ml/m RA Volume:   56.80 ml  25.61 ml/m LA Vol (A4C):   118.0 ml 53.20 ml/m LA Biplane Vol: 120.0 ml 54.10 ml/m  AORTIC VALVE LVOT Vmax:   97.50 cm/s LVOT Vmean:  71.000 cm/s LVOT VTI:    0.199 m                          PULMONARY ARTERY AORTA                    MPA diam:        3.10 cm Ao Root diam: 3.90 cm Ao Asc diam:  4.40 cm  SHUNTS Systemic VTI:  0.20 m Systemic Diam: 2.30 cm Zoila Shutter MD Electronically signed by Zoila Shutter MD Signature Date/Time: 03/27/2020/3:35:16 PM    Final    US Abdomen Limited RUQ  Result Date: 03/25/2020 CLINICAL DATA:  Right upper quadrant pain EXAM: ULTRASOUND ABDOMEN LIMITED RIGHT UPPER QUADRANT COMPARISON:  None. FINDINGS: Gallbladder: Diffusely thickened gallbladder wall measuring up to 1 cm. No sonographic Murphy sign. Small amount of pericholecystic fluid is seen. Common bile duct: Diameter: 4.4 mm Liver: No focal lesion identified. Within normal limits in parenchymal echogenicity. Portal vein is patent on color Doppler imaging with normal direction of blood flow towards the liver. Other: None. IMPRESSION: Diffusely thickened gallbladder wall  with a small amount of pericholecystic fluid. This is nonspecific, could be due to acalculous cholecystitis Electronically Signed   By: Jonna Clark M.D.   On: 03/25/2020 00:13      The results of significant diagnostics from this hospitalization (including imaging, microbiology, ancillary and laboratory) are listed below for reference.     Microbiology: Recent Results (from the past 240 hour(s))  Culture, blood (Routine x 2)     Status: Abnormal   Collection Time: 03/24/20  6:20 PM   Specimen: Left Antecubital; Blood  Result Value Ref Range Status   Specimen Description   Final    LEFT ANTECUBITAL Performed at [facility], [address], [place], [place] [address]    Special Requests   Final    BOTTLES DRAWN AEROBIC AND ANAEROBIC Blood Culture adequate volume Performed at [facility], [address] [place], [place] [address]    Culture  Setup Time   Final    GRAM NEGATIVE RODS IN BOTH AEROBIC AND ANAEROBIC BOTTLES Gram Stain Report Called to,Read Back By and Verified With: BAINE C. @ [place] @ 0852 ON [id number] BY HENDERSON L CRITICAL RESULT CALLED TO, READ BACK BY AND VERIFIED WITH: D. PIERCE, PHARMD AT 1354 ON 03/25/20 BY C. JESSUP, MT.    Culture (A)  Final    ESCHERICHIA COLI SUSCEPTIBILITIES PERFORMED ON PREVIOUS CULTURE WITHIN THE LAST 5 DAYS. Performed at [facility], [address] [place], [place] [address]    Report Status  03/27/2020 FINAL  Final  Culture, blood (Routine x 2)     Status: Abnormal   Collection Time: 03/24/20  7:01 PM   Specimen: Left Antecubital; Blood  Result Value Ref Range Status   Specimen Description   Final    LEFT ANTECUBITAL Performed at [facility], [address], [place], [place] [address]    Special Requests   Final    BOTTLES DRAWN AEROBIC AND ANAEROBIC Blood Culture adequate volume Performed at [facility], [address] [place], [place] [address]    Culture  Setup Time   Final    GRAM NEGATIVE RODS IN BOTH AEROBIC AND  ANAEROBIC BOTTLES Gram Stain Report Called to,Read Back By and Verified With: BAINE C. @ [place] @ 0852 ON [id number] BY HENDERSON L CRITICAL RESULT CALLED TO, READ BACK BY AND VERIFIED WITH: D. PIERCE, PHARMD AT 1354 ON 03/25/20 BY C. JESSUP, MT. Performed at [facility], [address], [place], [place] [address]    Culture ESCHERICHIA COLI (A)  Final   Report Status 03/27/2020 FINAL  Final   Organism ID, Bacteria ESCHERICHIA COLI  Final      Susceptibility   Escherichia coli - MIC*    AMPICILLIN 4 SENSITIVE Sensitive     CEFAZOLIN <=4 SENSITIVE Sensitive     CEFEPIME <=0.12 SENSITIVE Sensitive     CEFTAZIDIME <=1 SENSITIVE Sensitive     CEFTRIAXONE <=0.25 SENSITIVE Sensitive     CIPROFLOXACIN <=0.25 SENSITIVE Sensitive     GENTAMICIN <=1 SENSITIVE Sensitive     IMIPENEM <=0.25 SENSITIVE Sensitive     TRIMETH/SULFA <=20 SENSITIVE Sensitive     AMPICILLIN/SULBACTAM <=2 SENSITIVE Sensitive     PIP/TAZO <=4 SENSITIVE Sensitive     * ESCHERICHIA COLI  Blood Culture ID Panel (Reflexed)     Status: Abnormal   Collection Time: 03/24/20  7:01 PM  Result Value Ref Range Status   Enterococcus species NOT DETECTED NOT DETECTED Final   Listeria monocytogenes NOT DETECTED NOT DETECTED Final   Staphylococcus species NOT DETECTED NOT DETECTED Final   Staphylococcus aureus (BCID) NOT DETECTED NOT DETECTED Final   Streptococcus species NOT DETECTED NOT DETECTED Final   Streptococcus agalactiae NOT DETECTED NOT DETECTED Final   Streptococcus pneumoniae NOT DETECTED NOT DETECTED Final   Streptococcus pyogenes NOT DETECTED NOT DETECTED Final   Acinetobacter baumannii NOT DETECTED NOT DETECTED Final   Enterobacteriaceae species DETECTED (A) NOT DETECTED Final    Comment: Enterobacteriaceae represent a large family of gram-negative bacteria, not a single organism. CRITICAL RESULT CALLED TO, READ BACK BY AND VERIFIED WITH: D. PIERCE, PHARMD AT 1354 ON 03/25/20 BY C. JESSUP, MT.    Enterobacter  cloacae complex NOT DETECTED NOT DETECTED Final   Escherichia coli DETECTED (A) NOT DETECTED Final    Comment: CRITICAL RESULT CALLED TO, READ BACK BY AND VERIFIED WITH: D. PIERCE, PHARMD AT 1354 ON 03/25/20 BY C. JESSUP, MT.    Klebsiella oxytoca NOT DETECTED NOT DETECTED Final   Klebsiella pneumoniae NOT DETECTED NOT DETECTED Final   Proteus species NOT DETECTED NOT DETECTED Final   Serratia marcescens NOT DETECTED NOT DETECTED Final   Carbapenem resistance NOT DETECTED NOT DETECTED Final   Haemophilus influenzae NOT DETECTED NOT DETECTED Final   Neisseria meningitidis NOT DETECTED NOT DETECTED Final   Pseudomonas aeruginosa NOT DETECTED NOT DETECTED Final   Candida albicans NOT DETECTED NOT DETECTED Final   Candida glabrata NOT DETECTED NOT DETECTED Final   Candida krusei NOT DETECTED NOT DETECTED  Final   Candida parapsilosis NOT DETECTED NOT DETECTED Final   Candida tropicalis NOT DETECTED NOT DETECTED Final    Comment: Performed at [facility], [address], [place], [place] [address]  Respiratory Panel by RT PCR (Flu A&B, Covid) - Nasopharyngeal Swab     Status: None   Collection Time: 03/24/20  7:10 PM   Specimen: Nasopharyngeal Swab  Result Value Ref Range Status   SARS Coronavirus 2 by RT PCR NEGATIVE NEGATIVE Final    Comment: (NOTE) SARS-CoV-2 target nucleic acids are NOT DETECTED. The SARS-CoV-2 RNA is generally detectable in upper respiratoy specimens during the acute phase of infection. The lowest concentration of SARS-CoV-2 viral copies this assay can detect is 131 copies/mL. A negative result does not preclude SARS-Cov-2 infection and should not be used as the sole basis for treatment or other patient management decisions. A negative result may occur with  improper specimen collection/handling, submission of specimen other than nasopharyngeal swab, presence of viral mutation(s) within the areas targeted by this assay, and inadequate number of viral  copies (<131 copies/mL). A negative result must be combined with clinical observations, patient history, and epidemiological information. The expected result is Negative. Fact Sheet for Patients:  https://www.moore.com/ Fact Sheet for Healthcare Providers:  https://www.young.biz/ This test is not yet ap proved or cleared by the [place] FDA and  has been authorized for detection and/or diagnosis of SARS-CoV-2 by FDA under an Emergency Use Authorization (EUA). This EUA will remain  in effect (meaning this test can be used) for the duration of the COVID-19 declaration under Section 564(b)(1) of the Act, 21 U.S.C. section 360bbb-3(b)(1), unless the authorization is terminated or revoked sooner.    Influenza A by PCR NEGATIVE NEGATIVE Final   Influenza B by PCR NEGATIVE NEGATIVE Final    Comment: (NOTE) The Xpert Xpress SARS-CoV-2/FLU/RSV assay is intended as an aid in  the diagnosis of influenza from Nasopharyngeal swab specimens and  should not be used as a sole basis for treatment. Nasal washings and  aspirates are unacceptable for Xpert Xpress SARS-CoV-2/FLU/RSV  testing. Fact Sheet for Patients: https://www.moore.com/ Fact Sheet for Healthcare Providers: https://www.young.biz/ This test is not yet approved or cleared by the [place] FDA and  has been authorized for detection and/or diagnosis of SARS-CoV-2 by  FDA under an Emergency Use Authorization (EUA). This EUA will remain  in effect (meaning this test can be used) for the duration of the  Covid-19 declaration under Section 564(b)(1) of the Act, 21  U.S.C. section 360bbb-3(b)(1), unless the authorization is  terminated or revoked. Performed at [facility], [address] [place], [place] [address]      Labs: BNP (last 3 results) Recent Labs    03/26/20 0256 03/28/20 0344  BNP 230.0* 187.9*   Basic Metabolic Panel: Recent Labs   Lab 03/26/20 0256 03/27/20 0527 03/28/20 0344 03/29/20 0438 03/30/20 0506  NA 139 138 133* 138 136  K 3.8 3.4* 3.6 3.2* 3.3*  CL 105 99 97* 97* 96*  CO2 21* 24 24 28 26   GLUCOSE 147* 122* 143* 126* 179*  BUN 18 13 13 11 8   CREATININE 1.13 0.94 0.89 1.05 1.04  CALCIUM 7.8* 8.2* 7.9* 8.3* 8.4*  MG 2.2 1.9 1.9 1.8 1.9  PHOS 2.5 2.2*  --   --   --    Liver Function Tests: Recent Labs  Lab 03/26/20 0256 03/27/20 0527 03/28/20 0344 03/29/20 0438 03/30/20 0506  AST 120* 54* 28 41 61*  ALT 184* 126* 81* 73* 74*  ALKPHOS 145* 169* 138* 133* 129*  BILITOT 6.5* 3.5* 1.8* 1.8* 1.2  PROT 5.0* 5.6* 5.1* 5.4* 5.7*  ALBUMIN 2.6*  2.6* 2.6* 2.4* 2.6* 2.6*   Recent Labs  Lab 03/25/20 0140 03/26/20 0256 03/27/20 0527 03/28/20 0344  LIPASE 1,476* 571* 154* 76*   No results for input(s): AMMONIA in the last 168 hours. CBC: Recent Labs  Lab 03/24/20 1820 03/24/20 1820 03/25/20 0420 03/25/20 0420 03/26/20 0256 03/27/20 0527 03/28/20 0344 03/29/20 0438 03/30/20 0506  WBC 7.7   < > 20.4*   < > 16.7* 14.4* 12.2* 11.4* 9.4  NEUTROABS 7.1  --  18.7*  --  14.7* 12.5*  --   --   --   HGB 16.1   < > 15.0   < > 14.4 14.7 14.6 15.1 15.2  HCT 47.7   < > 44.7   < > 42.2 42.9 42.9 43.5 44.7  MCV 90.0   < > 89.0   < > 88.3 86.7 88.1 87.7 87.8  PLT 178   < > 161   < > 137* 149* 147* 174 189   < > = values in this interval not displayed.   Cardiac Enzymes: No results for input(s): CKTOTAL, CKMB, CKMBINDEX, TROPONINI in the last 168 hours. BNP: Invalid input(s): POCBNP CBG: Recent Labs  Lab 03/29/20 1706 03/29/20 1959 03/30/20 0004 03/30/20 0424 03/30/20 0759  GLUCAP 186* 204* 120* 175* 139*   D-Dimer No results for input(s): DDIMER in the last 72 hours. Hgb A1c No results for input(s): HGBA1C in the last 72 hours. Lipid Profile No results for input(s): CHOL, HDL, LDLCALC, TRIG, CHOLHDL, LDLDIRECT in the last 72 hours. Thyroid function studies No results for input(s):  TSH, T4TOTAL, T3FREE, THYROIDAB in the last 72 hours.  Invalid input(s): FREET3 Anemia work up No results for input(s): VITAMINB12, FOLATE, FERRITIN, TIBC, IRON, RETICCTPCT in the last 72 hours. Urinalysis    Component Value Date/Time   COLORURINE AMBER (A) 03/25/2020 0450   APPEARANCEUR CLEAR 03/25/2020 0450   LABSPEC 1.023 03/25/2020 0450   PHURINE 5.0 03/25/2020 0450   GLUCOSEU NEGATIVE 03/25/2020 0450   HGBUR NEGATIVE 03/25/2020 0450   BILIRUBINUR NEGATIVE 03/25/2020 0450   BILIRUBINUR small 08/07/2015 1659   KETONESUR NEGATIVE 03/25/2020 0450   PROTEINUR 30 (A) 03/25/2020 0450   UROBILINOGEN negative 08/07/2015 1659   UROBILINOGEN 0.2 09/20/2014 2300   NITRITE NEGATIVE 03/25/2020 0450   LEUKOCYTESUR NEGATIVE 03/25/2020 0450   Sepsis Labs Invalid input(s): PROCALCITONIN,  WBC,  LACTICIDVEN Microbiology Recent Results (from the past 240 hour(s))  Culture, blood (Routine x 2)     Status: Abnormal   Collection Time: 03/24/20  6:20 PM   Specimen: Left Antecubital; Blood  Result Value Ref Range Status   Specimen Description   Final    LEFT ANTECUBITAL Performed at [facility], [address], [place], [place] [address]    Special Requests   Final    BOTTLES DRAWN AEROBIC AND ANAEROBIC Blood Culture adequate volume Performed at [facility], [address], [place], [place] [address]    Culture  Setup Time   Final    GRAM NEGATIVE RODS IN BOTH AEROBIC AND ANAEROBIC BOTTLES Gram Stain Report Called to,Read Back By and Verified With: BAINE C. @ [place] @ 0852 ON [id number] BY HENDERSON L CRITICAL RESULT CALLED TO, READ BACK BY AND VERIFIED WITH: D. PIERCE, PHARMD AT 1354 ON 03/25/20 BY C. JESSUP, MT.    Culture (A)  Final    ESCHERICHIA COLI SUSCEPTIBILITIES PERFORMED ON PREVIOUS CULTURE WITHIN THE LAST 5 DAYS. Performed at [facility], [address] [place], [place] [address]    Report Status 03/27/2020 FINAL  Final  Culture, blood (Routine x 2)     Status:  Abnormal   Collection Time: 03/24/20  7:01 PM   Specimen: Left Antecubital; Blood  Result Value Ref Range Status   Specimen Description   Final    LEFT ANTECUBITAL Performed at [facility], [address], [place], [place] [address]    Special Requests   Final    BOTTLES DRAWN AEROBIC AND ANAEROBIC Blood Culture adequate volume Performed at [facility], [address], [place], [place] [address]    Culture  Setup Time   Final    GRAM NEGATIVE RODS IN BOTH AEROBIC AND ANAEROBIC BOTTLES Gram Stain Report Called to,Read Back By and Verified With: BAINE C. @ [place] @ 0852 ON [id number] BY HENDERSON L CRITICAL RESULT CALLED TO, READ BACK BY AND VERIFIED WITH: D. PIERCE, PHARMD AT 1354 ON 03/25/20 BY C. JESSUP, MT. Performed at [facility], [address] [place], [place] [address]    Culture ESCHERICHIA COLI (A)  Final   Report Status 03/27/2020 FINAL  Final   Organism ID, Bacteria ESCHERICHIA COLI  Final      Susceptibility   Escherichia coli - MIC*    AMPICILLIN 4 SENSITIVE Sensitive     CEFAZOLIN <=4 SENSITIVE Sensitive     CEFEPIME <=0.12 SENSITIVE Sensitive     CEFTAZIDIME <=1 SENSITIVE Sensitive     CEFTRIAXONE <=0.25 SENSITIVE Sensitive     CIPROFLOXACIN <=0.25 SENSITIVE Sensitive     GENTAMICIN <=1 SENSITIVE Sensitive     IMIPENEM <=0.25 SENSITIVE Sensitive     TRIMETH/SULFA <=20 SENSITIVE Sensitive     AMPICILLIN/SULBACTAM <=2 SENSITIVE Sensitive     PIP/TAZO <=4 SENSITIVE Sensitive     * ESCHERICHIA COLI  Blood Culture ID Panel (Reflexed)     Status: Abnormal   Collection Time: 03/24/20  7:01 PM  Result Value Ref Range Status   Enterococcus species NOT DETECTED NOT DETECTED Final   Listeria monocytogenes NOT DETECTED NOT DETECTED Final   Staphylococcus species NOT DETECTED NOT DETECTED Final   Staphylococcus aureus (BCID) NOT DETECTED NOT DETECTED Final   Streptococcus species NOT DETECTED NOT DETECTED Final   Streptococcus agalactiae NOT DETECTED NOT DETECTED  Final   Streptococcus pneumoniae NOT DETECTED NOT DETECTED Final   Streptococcus pyogenes NOT DETECTED NOT DETECTED Final   Acinetobacter baumannii NOT DETECTED NOT DETECTED Final   Enterobacteriaceae species DETECTED (A) NOT DETECTED Final    Comment: Enterobacteriaceae represent a large family of gram-negative bacteria, not a single organism. CRITICAL RESULT CALLED TO, READ BACK BY AND VERIFIED WITH: D. PIERCE, PHARMD AT 1354 ON 03/25/20 BY C. JESSUP, MT.    Enterobacter cloacae complex NOT DETECTED NOT DETECTED Final   Escherichia coli DETECTED (A) NOT DETECTED Final    Comment: CRITICAL RESULT CALLED TO, READ BACK BY AND VERIFIED WITH: D. PIERCE, PHARMD AT 1354 ON 03/25/20 BY C. JESSUP, MT.    Klebsiella oxytoca NOT DETECTED NOT DETECTED Final   Klebsiella pneumoniae NOT DETECTED NOT DETECTED Final   Proteus species NOT DETECTED NOT DETECTED Final   Serratia marcescens NOT DETECTED NOT DETECTED Final   Carbapenem resistance NOT DETECTED NOT DETECTED Final   Haemophilus influenzae NOT DETECTED NOT DETECTED Final   Neisseria meningitidis NOT DETECTED NOT DETECTED Final  Pseudomonas aeruginosa NOT DETECTED NOT DETECTED Final   Candida albicans NOT DETECTED NOT DETECTED Final   Candida glabrata NOT DETECTED NOT DETECTED Final   Candida krusei NOT DETECTED NOT DETECTED Final   Candida parapsilosis NOT DETECTED NOT DETECTED Final   Candida tropicalis NOT DETECTED NOT DETECTED Final    Comment: Performed at [facility], [address], [place], [place] [address]  Respiratory Panel by RT PCR (Flu A&B, Covid) - Nasopharyngeal Swab     Status: None   Collection Time: 03/24/20  7:10 PM   Specimen: Nasopharyngeal Swab  Result Value Ref Range Status   SARS Coronavirus 2 by RT PCR NEGATIVE NEGATIVE Final    Comment: (NOTE) SARS-CoV-2 target nucleic acids are NOT DETECTED. The SARS-CoV-2 RNA is generally detectable in upper respiratoy specimens during the acute phase of infection. The  lowest concentration of SARS-CoV-2 viral copies this assay can detect is 131 copies/mL. A negative result does not preclude SARS-Cov-2 infection and should not be used as the sole basis for treatment or other patient management decisions. A negative result may occur with  improper specimen collection/handling, submission of specimen other than nasopharyngeal swab, presence of viral mutation(s) within the areas targeted by this assay, and inadequate number of viral copies (<131 copies/mL). A negative result must be combined with clinical observations, patient history, and epidemiological information. The expected result is Negative. Fact Sheet for Patients:  https://www.moore.com/ Fact Sheet for Healthcare Providers:  https://www.young.biz/ This test is not yet ap proved or cleared by the [place] FDA and  has been authorized for detection and/or diagnosis of SARS-CoV-2 by FDA under an Emergency Use Authorization (EUA). This EUA will remain  in effect (meaning this test can be used) for the duration of the COVID-19 declaration under Section 564(b)(1) of the Act, 21 U.S.C. section 360bbb-3(b)(1), unless the authorization is terminated or revoked sooner.    Influenza A by PCR NEGATIVE NEGATIVE Final   Influenza B by PCR NEGATIVE NEGATIVE Final    Comment: (NOTE) The Xpert Xpress SARS-CoV-2/FLU/RSV assay is intended as an aid in  the diagnosis of influenza from Nasopharyngeal swab specimens and  should not be used as a sole basis for treatment. Nasal washings and  aspirates are unacceptable for Xpert Xpress SARS-CoV-2/FLU/RSV  testing. Fact Sheet for Patients: https://www.moore.com/ Fact Sheet for Healthcare Providers: https://www.young.biz/ This test is not yet approved or cleared by the [place] FDA and  has been authorized for detection and/or diagnosis of SARS-CoV-2 by  FDA under an Emergency  Use Authorization (EUA). This EUA will remain  in effect (meaning this test can be used) for the duration of the  Covid-19 declaration under Section 564(b)(1) of the Act, 21  U.S.C. section 360bbb-3(b)(1), unless the authorization is  terminated or revoked. Performed at [facility], [address], [place], [place] [address]      Time coordinating discharge:  I have spent 35 minutes face to face with the patient and on the ward discussing the patients care, assessment, plan and disposition with other care givers. >50% of the time was devoted counseling the patient about the risks and benefits of treatment/Discharge disposition and coordinating care.   SIGNED:   Dimple Nanas, MD  [facility] 03/30/2020, 10:46 AM   If 7PM-7AM, please contact night-coverage

## 2020-04-02 NOTE — Patient Outreach (Signed)
Assigned Red Flagged Emmi to Demetrios Loll, RN [facility] Care Coordinator for [facility].

## 2020-04-04 ENCOUNTER — Telehealth: Payer: Self-pay | Admitting: Family Medicine

## 2020-04-04 NOTE — Telephone Encounter (Signed)
Patient has a follow up appointment scheduled. 

## 2020-04-05 ENCOUNTER — Ambulatory Visit: Payer: Medicare Other | Admitting: *Deleted

## 2020-04-05 NOTE — Chronic Care Management (AMB) (Signed)
  Care Management   Note  04/05/2020 Name: TRAFTON ROKER MRN: [id number] DOB: 1941-10-30  [name] was referred to me by [facility] s/p hospital discharge to follow-up on a Red EMMI flag. I reached out to [name] today by telephone but the attempt was unsuccessful.    Chart reviewed. Patient was hospitalized at [facility] from 3/27 to 4/2. He has a hosp f/u scheduled with his PCP on 04/13/20 and with cardiologist on 04/30/20. He is not enrolled in Chronic Care Management and previously declined when asked.  Follow up plan: RN will reach out by telephone again over the next 5 days.  Demetrios Loll, BSN, RN-BC Embedded Chronic Care Manager [facility] Care Management Direct Dial: [phone]

## 2020-04-06 ENCOUNTER — Ambulatory Visit: Payer: Medicare Other | Admitting: *Deleted

## 2020-04-06 NOTE — Chronic Care Management (AMB) (Signed)
  Care Management   Outreach Note  04/06/2020 Name: Vincent Collins MRN: [id number] DOB: [birth year]  Referred by: Dettinger, Elige Radon, MD Reason for referral : Chronic Care Management (f/u on Red EMMI)   An unsuccessful telephone outreach was attempted today. The patient was referred to the RN Care Manager to address Red EMMI flag s/p recent hospital discharge.    Follow Up Plan: Follow-up with PCP on 04/13/20 regarding recent hospitalization  Demetrios Loll, BSN, RN-BC Embedded Chronic Care Manager [facility] Care Management Direct Dial: [phone]

## 2020-04-13 ENCOUNTER — Other Ambulatory Visit: Payer: Self-pay | Admitting: Cardiovascular Disease

## 2020-04-13 ENCOUNTER — Telehealth: Payer: Self-pay | Admitting: Cardiovascular Disease

## 2020-04-13 ENCOUNTER — Other Ambulatory Visit: Payer: Self-pay

## 2020-04-13 ENCOUNTER — Encounter: Payer: Self-pay | Admitting: Family Medicine

## 2020-04-13 ENCOUNTER — Ambulatory Visit (INDEPENDENT_AMBULATORY_CARE_PROVIDER_SITE_OTHER): Payer: Medicare Other | Admitting: Family Medicine

## 2020-04-13 VITALS — BP 140/71 | HR 53 | Temp 98.3°F | Ht 68.0 in | Wt 239.5 lb

## 2020-04-13 DIAGNOSIS — I4891 Unspecified atrial fibrillation: Secondary | ICD-10-CM

## 2020-04-13 DIAGNOSIS — K819 Cholecystitis, unspecified: Secondary | ICD-10-CM | POA: Diagnosis not present

## 2020-04-13 MED ORDER — AMIODARONE HCL 200 MG PO TABS: 200.0000 mg | ORAL_TABLET | Freq: Every day | ORAL | 0 refills | Status: DC

## 2020-04-13 MED ORDER — APIXABAN 5 MG PO TABS: 5.0000 mg | ORAL_TABLET | Freq: Two times a day (BID) | ORAL | 0 refills | Status: DC

## 2020-04-13 NOTE — Progress Notes (Signed)
 BP 140/71   Pulse (!) 53   Temp 98.3 F (36.8 C) (Temporal)   Ht 5' 8" (1.727 m)   Wt 239 lb 8 oz (108.6 kg)   BMI 36.42 kg/m    Subjective:   Patient ID: Vincent Collins, male    DOB: 12/19/1941, 78 y.o.   MRN: [id number]  HPI: Vincent Collins is a 78 y.o. male presenting on 04/13/2020 for Hospitalization Follow-up   HPI Patient is here for hospital follow up for acalculus cholecystitis and pancreatitis.  He was in the hospital from 03/24/2020 until 03/30/2020.  He was also found to be in A. fib which was paroxysmal and started medications for that but then he came out of the A. fib but he still has follow-up with cardiology will follow up with them about that.  Patient denies any further abdominal pain and feels like things are doing very well  Relevant past medical, surgical, family and social history reviewed and updated as indicated. Interim medical history since our last visit reviewed. Allergies and medications reviewed and updated.  Review of Systems  Constitutional: Negative for chills and fever.  Respiratory: Negative for shortness of breath and wheezing.   Cardiovascular: Negative for chest pain and leg swelling.  Gastrointestinal: Negative for abdominal pain, blood in stool, constipation and diarrhea.  Musculoskeletal: Negative for back pain and gait problem.  Skin: Negative for rash.  All other systems reviewed and are negative.  Per HPI unless specifically indicated above   Allergies as of 04/13/2020      Reactions   Diphenhydramine Hcl Other (See Comments)   Can tolerate      Medication List       Accurate as of April 13, 2020  2:04 PM. If you have any questions, ask your nurse or doctor.        albuterol 108 (90 Base) MCG/ACT inhaler Commonly known as: ProAir HFA USE 2 PUFFS EVERY 6 HOURS  AS NEEDED FOR WHEEZING What changed:   how much to take  how to take this  when to take this  reasons to take this  additional instructions   albuterol (2.5  MG/3ML) 0.083% nebulizer solution Commonly known as: PROVENTIL Take 3 mLs (2.5 mg total) by nebulization 4 (four) times daily as needed for wheezing or shortness of breath. What changed: Another medication with the same name was changed. Make sure you understand how and when to take each.   amiodarone 200 MG tablet Commonly known as: PACERONE Take 1 tablet (200 mg total) by mouth daily. What changed: See the new instructions. Changed by: Scott T O'Neal, MD   apixaban 5 MG Tabs tablet Commonly known as: ELIQUIS Take 1 tablet (5 mg total) by mouth 2 (two) times daily.   aspirin 81 MG EC tablet Take 81 mg by mouth every evening.   atorvastatin 40 MG tablet Commonly known as: LIPITOR TAKE 1 TABLET BY MOUTH  DAILY What changed: when to take this   CO-Q 10 Omega-3 Fish Oil Caps Take 1 capsule by mouth in the morning. 100mg Capsule Qunol   fluticasone 50 MCG/ACT nasal spray Commonly known as: FLONASE USE 1 SPRAY INTO BOTH  NOSTRILS 2 TIMES DAILY AS  NEEDED FOR ALLERGIES OR  RHINITIS What changed: See the new instructions.   lisinopril 20 MG tablet Commonly known as: ZESTRIL TAKE 1 TABLET BY MOUTH  DAILY What changed: when to take this   metFORMIN 500 MG 24 hr tablet Commonly known as: GLUCOPHAGE-XR TAKE   1 TABLET BY MOUTH TWO  TIMES DAILY What changed: when to take this   metoprolol succinate 50 MG 24 hr tablet Commonly known as: TOPROL-XL TAKE 1 TABLET BY MOUTH  DAILY WITH OR IMMEDIATELY  FOLLOWING A MEAL What changed: See the new instructions.   omeprazole 20 MG capsule Commonly known as: PRILOSEC TAKE 1 CAPSULE BY MOUTH  DAILY   pantoprazole 40 MG tablet Commonly known as: PROTONIX Take 40 mg by mouth every morning.   tamsulosin 0.4 MG Caps capsule Commonly known as: FLOMAX TAKE 2 CAPSULES BY MOUTH  DAILY AFTER SUPPER What changed: See the new instructions.   Vitamin D 1000 units capsule Take 1,000 Units by mouth in the morning.        Objective:   BP  140/71   Pulse (!) 53   Temp 98.3 F (36.8 C) (Temporal)   Ht 5' 8" (1.727 m)   Wt 239 lb 8 oz (108.6 kg)   BMI 36.42 kg/m   Wt Readings from Last 3 Encounters:  04/13/20 239 lb 8 oz (108.6 kg)  03/30/20 249 lb 9.6 oz (113.2 kg)  01/04/20 244 lb (110.7 kg)    Physical Exam Vitals and nursing note reviewed.  Constitutional:      General: He is not in acute distress.    Appearance: He is well-developed. He is not diaphoretic.  Eyes:     General: No scleral icterus.    Conjunctiva/sclera: Conjunctivae normal.  Neck:     Thyroid: No thyromegaly.  Cardiovascular:     Rate and Rhythm: Normal rate and regular rhythm.     Heart sounds: Normal heart sounds. No murmur.  Pulmonary:     Effort: Pulmonary effort is normal. No respiratory distress.     Breath sounds: Normal breath sounds. No wheezing.  Musculoskeletal:        General: Normal range of motion.     Cervical back: Neck supple.  Lymphadenopathy:     Cervical: No cervical adenopathy.  Skin:    General: Skin is warm and dry.     Findings: No rash.  Neurological:     Mental Status: He is alert and oriented to person, place, and time.     Coordination: Coordination normal.  Psychiatric:        Behavior: Behavior normal.       Assessment & Plan:   Problem List Items Addressed This Visit    None    Visit Diagnoses    Acalculous cholecystitis    -  Primary   Relevant Orders   CBC with Differential/Platelet   CMP14+EGFR   Atrial fibrillation, unspecified type (HCC)       Relevant Orders   CBC with Differential/Platelet   CMP14+EGFR      Patient seems like he is doing well, he does have follow-up with cardiology and he denies any further abdominal pain but does have follow-up with the general surgery as well.  We will follow up along with it and recheck a CBC and BMP.  Have him come back in 1 to 2 months or call back sooner if he is having any other symptoms. Follow up plan: Return if symptoms worsen or fail to  improve, for 1-2 month recheck and bloodwork.  Counseling provided for all of the vaccine components No orders of the defined types were placed in this encounter.   Joshua Dettinger, MD [facility] 04/13/2020, 2:04 PM     

## 2020-04-13 NOTE — Telephone Encounter (Signed)
Spoke with patient and will mail DPR form. He is not a "new patient" and history is in chart.

## 2020-04-13 NOTE — Telephone Encounter (Signed)
*  STAT* If patient is at the pharmacy, call can be transferred to refill team.   1. Which medications need to be refilled? (please list name of each medication and dose if known)  amiodarone (PACERONE) 200 MG tablet apixaban (ELIQUIS) 5 MG TABS tablet  2. Which pharmacy/location (including street and city if local pharmacy) is medication to be sent to? [facility] [address], [place] - [address]  3. Do they need a 30 day or 90 day supply? 35  Wife of patient says the delivery is inconsistent from [facility] so she would just like to get them from [place]

## 2020-04-13 NOTE — Addendum Note (Signed)
Addended by: Rosalee Kaufman on: 04/13/2020 12:40 PM   Modules accepted: Orders

## 2020-04-13 NOTE — Telephone Encounter (Signed)
Wife of patient wanted to know if there was any paperwork to be filled out prior to the appointment. The wife has poor eyesight and would like any paperwork to be mailed to her in advance.

## 2020-04-14 LAB — CBC WITH DIFFERENTIAL/PLATELET
Basophils Absolute: 0.1 10*3/uL (ref 0.0–0.2)
Basos: 1 %
EOS (ABSOLUTE): 0.1 10*3/uL (ref 0.0–0.4)
Eos: 2 %
Hematocrit: 43.9 % (ref 37.5–51.0)
Hemoglobin: 15.1 g/dL (ref 13.0–17.7)
Immature Grans (Abs): 0 10*3/uL (ref 0.0–0.1)
Immature Granulocytes: 0 %
Lymphocytes Absolute: 1.8 10*3/uL (ref 0.7–3.1)
Lymphs: 26 %
MCH: 30.4 pg (ref 26.6–33.0)
MCHC: 34.4 g/dL (ref 31.5–35.7)
MCV: 89 fL (ref 79–97)
Monocytes Absolute: 0.6 10*3/uL (ref 0.1–0.9)
Monocytes: 9 %
Neutrophils Absolute: 4.3 10*3/uL (ref 1.4–7.0)
Neutrophils: 62 %
Platelets: 220 10*3/uL (ref 150–450)
RBC: 4.96 x10E6/uL (ref 4.14–5.80)
RDW: 13.1 % (ref 11.6–15.4)
WBC: 7 10*3/uL (ref 3.4–10.8)

## 2020-04-14 LAB — CMP14+EGFR
ALT: 16 IU/L (ref 0–44)
AST: 16 IU/L (ref 0–40)
Albumin/Globulin Ratio: 2.1 (ref 1.2–2.2)
Albumin: 4 g/dL (ref 3.7–4.7)
Alkaline Phosphatase: 109 IU/L (ref 39–117)
BUN/Creatinine Ratio: 12 (ref 10–24)
BUN: 12 mg/dL (ref 8–27)
Bilirubin Total: 1 mg/dL (ref 0.0–1.2)
CO2: 23 mmol/L (ref 20–29)
Calcium: 9 mg/dL (ref 8.6–10.2)
Chloride: 103 mmol/L (ref 96–106)
Creatinine, Ser: 1.03 mg/dL (ref 0.76–1.27)
GFR calc Af Amer: 81 mL/min/{1.73_m2} (ref >59)
GFR calc non Af Amer: 70 mL/min/{1.73_m2} (ref >59)
Globulin, Total: 1.9 g/dL (ref 1.5–4.5)
Glucose: 98 mg/dL (ref 65–99)
Potassium: 4.3 mmol/L (ref 3.5–5.2)
Sodium: 139 mmol/L (ref 134–144)
Total Protein: 5.9 g/dL — ABNORMAL LOW (ref 6.0–8.5)

## 2020-04-16 ENCOUNTER — Ambulatory Visit (INDEPENDENT_AMBULATORY_CARE_PROVIDER_SITE_OTHER): Payer: Medicare Other | Admitting: *Deleted

## 2020-04-16 DIAGNOSIS — Z0000 Encounter for general adult medical examination without abnormal findings: Secondary | ICD-10-CM

## 2020-04-16 NOTE — Progress Notes (Signed)
Vincent Collins     MEDICARE ANNUAL WELLNESS VISIT  04/16/2020  Telephone Visit Disclaimer This Medicare AWV was conducted by telephone due to national recommendations for restrictions regarding the COVID-19 Pandemic (e.g. social distancing).  I verified, using two identifiers, that I am speaking with Vincent Collins or their authorized healthcare agent. I discussed the limitations, risks, security, and privacy concerns of performing an evaluation and management service by telephone and the potential availability of an in-person appointment in the future. The patient expressed understanding and agreed to proceed.   Subjective:  Vincent Collins is a 79 y.o. male patient of Dettinger, Vincent Radon, MD who had a Medicare Annual Wellness Visit today via telephone. Vincent Collins is Retired and lives with their spouse. he has 2 children. he reports that he is socially active and does interact with friends/family regularly. he is not physically active and enjoys church activities.  Patient Care Team: Dettinger, Vincent Radon, MD as PCP - General (Family Medicine)  Advanced Directives 04/16/2020 03/26/2020 03/24/2020 12/13/2019 06/17/2017 09/27/2015 08/22/2015  Does Patient Have a Medical Advance Directive? No No No No Yes;No No No  Does patient want to make changes to medical advance directive? - - - - Yes (MAU/Ambulatory/Procedural Areas - Information given) - -  Would patient like information on creating a medical advance directive? No - Patient declined No - Patient declined - - Yes (MAU/Ambulatory/Procedural Areas - Information given) No - patient declined information -    Hospital Utilization Over the Past 12 Months: # of hospitalizations or ER visits: 1 # of surgeries: 0  Review of Systems    Patient reports that his overall health is worse compared to last year.  History obtained from chart review and the patient  Patient Reported Readings (BP, Pulse, CBG, Weight, etc) none  Pain Assessment Pain : No/denies pain      Current Medications & Allergies (verified) Allergies as of 04/16/2020   No Active Allergies     Medication List       Accurate as of April 16, 2020  9:09 AM. If you have any questions, ask your nurse or doctor.        STOP taking these medications   aspirin 81 MG EC tablet     TAKE these medications   albuterol 108 (90 Base) MCG/ACT inhaler Commonly known as: ProAir HFA USE 2 PUFFS EVERY 6 HOURS  AS NEEDED FOR WHEEZING   albuterol (2.5 MG/3ML) 0.083% nebulizer solution Commonly known as: PROVENTIL Take 3 mLs (2.5 mg total) by nebulization 4 (four) times daily as needed for wheezing or shortness of breath.   amiodarone 200 MG tablet Commonly known as: PACERONE Take 1 tablet (200 mg total) by mouth daily.   apixaban 5 MG Tabs tablet Commonly known as: ELIQUIS Take 1 tablet (5 mg total) by mouth 2 (two) times daily.   atorvastatin 40 MG tablet Commonly known as: LIPITOR TAKE 1 TABLET BY MOUTH  DAILY What changed: when to take this   CO-Q 10 Omega-3 Fish Oil Caps Take 1 capsule by mouth in the morning. 100mg  Capsule Qunol   fluticasone 50 MCG/ACT nasal spray Commonly known as: FLONASE USE 1 SPRAY INTO BOTH  NOSTRILS 2 TIMES DAILY AS  NEEDED FOR ALLERGIES OR  RHINITIS What changed: See the new instructions.   lisinopril 20 MG tablet Commonly known as: ZESTRIL TAKE 1 TABLET BY MOUTH  DAILY What changed: when to take this   metFORMIN 500 MG 24 hr tablet Commonly known as: GLUCOPHAGE-XR TAKE  1 TABLET BY MOUTH TWO  TIMES DAILY What changed: when to take this   metoprolol succinate 50 MG 24 hr tablet Commonly known as: TOPROL-XL TAKE 1 TABLET BY MOUTH  DAILY WITH OR IMMEDIATELY  FOLLOWING A MEAL What changed: See the new instructions.   omeprazole 20 MG capsule Commonly known as: PRILOSEC TAKE 1 CAPSULE BY MOUTH  DAILY   pantoprazole 40 MG tablet Commonly known as: PROTONIX Take 40 mg by mouth every morning.   tamsulosin 0.4 MG Caps capsule Commonly  known as: FLOMAX TAKE 2 CAPSULES BY MOUTH  DAILY AFTER SUPPER What changed: See the new instructions.   Vitamin D 1000 units capsule Take 1,000 Units by mouth in the morning.       History (reviewed): Past Medical History:  Diagnosis Date  . Broken leg and ribs, right, closed, initial encounter   . COPD (chronic obstructive pulmonary disease) (HCC)   . Diabetes mellitus without complication (HCC)   . Diverticulitis   . GERD (gastroesophageal reflux disease)   . Hydronephrosis of left kidney   . Hyperlipidemia   . Hypertension   . Left ureteral stone    obstructing  . Renal calculus or stone 2015   Past Surgical History:  Procedure Laterality Date  . 2d echo  04/04/09  . CYSTOSCOPY WITH RETROGRADE PYELOGRAM, URETEROSCOPY AND STENT PLACEMENT Left 10/11/2014   Procedure: CYSTOSCOPY WITH RETROGRADE PYELOGRAM, DIAGNOSTIC URETEROSCOPY AND STENT PLACEMENT;  Surgeon: [name], MD;  Location: [facility];  Service: Urology;  Laterality: Left;  . LUMBAR DISC SURGERY    . SPINE SURGERY     Family History  Problem Relation Age of Onset  . Early death Father        MI age 52  . Heart attack Father   . COPD Brother   . Cancer Sister    Social History   Socioeconomic History  . Marital status: Married    Spouse name: Not on file  . Number of children: 2  . Years of education: 8th  . Highest education level: 8th grade  Occupational History  . Not on file  Tobacco Use  . Smoking status: Former Smoker    Types: Cigarettes    Quit date: 06/18/1967    Years since quitting: 52.8  . Smokeless tobacco: Former Neurosurgeon    Types: Chew  Substance and Sexual Activity  . Alcohol use: No  . Drug use: No  . Sexual activity: Not Currently  Other Topics Concern  . Not on file  Social History Narrative  . Not on file   Social Determinants of Health   Financial Resource Strain:   . Difficulty of Paying Living Expenses:   Food Insecurity:   . Worried About Community education officer in the Last Year:   . Barista in the Last Year:   Transportation Needs:   . Freight forwarder (Medical):   Vincent Collins Lack of Transportation (Non-Medical):   Physical Activity:   . Days of Exercise per Week:   . Minutes of Exercise per Session:   Stress:   . Feeling of Stress :   Social Connections:   . Frequency of Communication with Friends and Family:   . Frequency of Social Gatherings with Friends and Family:   . Attends Religious Services:   . Active Member of Clubs or Organizations:   . Attends Banker Meetings:   Vincent Collins Marital Status:     Activities of Daily Living In  your present state of health, do you have any difficulty performing the following activities: 04/16/2020 03/26/2020  Hearing? N N  Vision? Y N  Difficulty concentrating or making decisions? N N  Walking or climbing stairs? N Y  Dressing or bathing? N N  Doing errands, shopping? N N  Preparing Food and eating ? N -  Using the Toilet? N -  In the past six months, have you accidently leaked urine? N -  Do you have problems with loss of bowel control? N -  Managing your Medications? N -  Managing your Finances? N -  Housekeeping or managing your Housekeeping? N -  Some recent data might be hidden    Patient Education/ Literacy How often do you need to have someone help you when you read instructions, pamphlets, or other written materials from your doctor or pharmacy?: 2 - Rarely What is the last grade level you completed in school?: 8th  Exercise Current Exercise Habits: The patient does not participate in regular exercise at present, Exercise limited by: None identified  Diet Patient reports consuming 2 meals a day and 0 snack(s) a day Patient reports that his primary diet is: Low fat Patient reports that she does have regular access to food.   Depression Screen PHQ 2/9 Scores 04/16/2020 04/13/2020 01/04/2020 12/16/2019 01/04/2019 04/29/2018 12/16/2017  PHQ - 2 Score 0 0 0 0 0 0 0   PHQ- 9 Score - 0 0 - - - -     Fall Risk Fall Risk  04/16/2020 04/13/2020 01/04/2020 12/16/2019 01/04/2019  Falls in the past year? 1 1 1  0 0  Number falls in past yr: 0 0 0 - -  Injury with Fall? 1 1 1  - -  Comment - - - - -  Risk for fall due to : History of fall(s) History of fall(s) History of fall(s) - -  Follow up - Falls evaluation completed - - -     Objective:  [name] seemed alert and oriented and he participated appropriately during our telephone visit.  Blood Pressure Weight BMI  BP Readings from Last 3 Encounters:  04/13/20 140/71  03/30/20 132/67  01/04/20 134/83   Wt Readings from Last 3 Encounters:  04/13/20 239 lb 8 oz (108.6 kg)  03/30/20 249 lb 9.6 oz (113.2 kg)  01/04/20 244 lb (110.7 kg)   BMI Readings from Last 1 Encounters:  04/13/20 36.42 kg/m    *Unable to obtain current vital signs, weight, and BMI due to telephone visit type  Hearing/Vision  . Amear did not seem to have difficulty with hearing/understanding during the telephone conversation . Reports that he has not had a formal eye exam by an eye care professional within the past year . Reports that he has not had a formal hearing evaluation within the past year *Unable to fully assess hearing and vision during telephone visit type  Cognitive Function: 6CIT Screen 04/16/2020  What Year? 0 points  What month? 0 points  What time? 0 points  Count back from 20 0 points  Months in reverse 0 points  Repeat phrase 4 points  Total Score 4   (Normal:0-7, Significant for Dysfunction: >8)  Normal Cognitive Function Screening: Yes   Immunization & Health Maintenance Record Immunization History  Administered Date(s) Administered  . DTaP 11/23/2007  . Fluad Quad(high Dose 65+) 01/04/2020  . Influenza Whole 09/26/2010  . Influenza, High Dose Seasonal PF 10/07/2017, 01/04/2019  . Influenza,inj,Quad PF,6+ Mos 10/06/2013, 10/02/2014, 10/19/2015, 11/26/2016  .  Pneumococcal Conjugate-13 05/09/2015   . Pneumococcal Polysaccharide-23 04/05/2012, 04/20/2012  . Tdap 05/17/2009  . Zoster 05/17/2014    Health Maintenance  Topic Date Due  . COVID-19 Vaccine (1) Never done  . OPHTHALMOLOGY EXAM  05/26/2015  . TETANUS/TDAP  05/18/2019  . INFLUENZA VACCINE  07/29/2020  . HEMOGLOBIN A1C  09/25/2020  . FOOT EXAM  04/13/2021  . PNA vac Low Risk Adult  Completed       Assessment  This is a routine wellness examination for Vincent Collins.  Health Maintenance: Due or Overdue Health Maintenance Due  Topic Date Due  . COVID-19 Vaccine (1) Never done  . OPHTHALMOLOGY EXAM  05/26/2015  . TETANUS/TDAP  05/18/2019    Vincent Collins does not need a referral for Community Assistance: Care Management:   no Social Work:    no Prescription Assistance:  no Nutrition/Diabetes Education:  no   Plan:  Personalized Goals Goals Addressed            This Visit's Progress   . DIET - EAT MORE FRUITS AND VEGETABLES      . Exercise 150 minutes per week (moderate activity)   Not on track    Try to walk for at least 30 minutes daily. Avoid the heat of the day.     . Reduce portion size   On track     Personalized Health Maintenance & Screening Recommendations  Td vaccine  Diabetic eye exam Shingles vaccine  Lung Cancer Screening Recommended: no (Low Dose CT Chest recommended if Age 55-80 years, 30 pack-year currently smoking OR have quit w/in past 15 years) Hepatitis C Screening recommended: no HIV Screening recommended: no  Advanced Directives: Written information was not prepared per patient's request.  Referrals & Orders No orders of the defined types were placed in this encounter.   Follow-up Plan . Follow-up with Dettinger, Vincent Radon, MD as planned on 05/18/20 . Offer pt tdap and shingles at next visit, in ov notes. . Schedule diabetic eye exam asap. . Pt denied advanced directives at this time. . Pt has no trouble with hearing. . Pt made a new goal today to eat more fruits  and vegetables. His recent hospitalization think more on diet and weight loss. . Pt voices no concerns today . AVS printed and mailed to pt.    I have personally reviewed and noted the following in the patient's chart:   . Medical and social history . Use of alcohol, tobacco or illicit drugs  . Current medications and supplements . Functional ability and status . Nutritional status . Physical activity . Advanced directives . List of other physicians . Hospitalizations, surgeries, and ER visits in previous 12 months . Vitals . Screenings to include cognitive, depression, and falls . Referrals and appointments  In addition, I have reviewed and discussed with Vincent Collins certain preventive protocols, quality metrics, and best practice recommendations. A written personalized care plan for preventive services as well as general preventive health recommendations is available and can be mailed to the patient at his request.      Conard Novak, LPN  [id number]

## 2020-04-29 NOTE — Progress Notes (Signed)
Cardiology Office Note:   Date:  05/02/2020  NAME:  [name]    MRN: [id number] DOB:  [birth year]   PCP:  Dettinger, Elige Radon, MD  Cardiologist:  Reatha Harps, MD   Referring MD: Dettinger, Elige Radon, MD   Chief Complaint  Patient presents with  . Atrial Fibrillation    History of Present Illness:   NYCHOLAS Collins is a 79 y.o. male with a hx of Afib (2/2 pancreatitis), HTN, DM, obesity who is being seen today for the evaluation of atrial fibrillation at the request of Dettinger, Elige Radon, MD. Developed Afib 2/2 pancreatitis 03/27/2020. Started on amiodarone for 1 month of therapy to help maintain sinus rhythm. Follow-up today.   He presents after his hospitalization where he was diagnosed with atrial fibrillation.  EKG demonstrates normal sinus rhythm.  He reports he is doing well and got back to normal activity.  He does work at the [employer] and plans to go back to part-time work.  I do agree with this.  He denies any chest pain or shortness of breath with activity.  He is continue to lose weight and is not gaining any fluid from what I can see.  He is had no bleeding issues with Xarelto.  He is working on diet and exercising more.  Diabetes under good control.  Blood pressure bit elevated but he reports he was a little nervous about this appointment today.  Overall he seems to be doing well.  He is maintained on a statin for his diabetes.  I do not have a recent lipid profile.  This will be obtained by his primary care physician.  Overall seems to be doing well.  Problem List 1. Persistent Afib -diagnosed 03/27/2020 2/2 acute pancreatitis -CHADSVASC = 4 (DM, HTN, age) 2. DM -A1c 6.9 3. HTN 4. COPD  Past Medical History: Past Medical History:  Diagnosis Date  . Arrhythmia   . Broken leg and ribs, right, closed, initial encounter   . COPD (chronic obstructive pulmonary disease) (HCC)   . Diabetes mellitus without complication (HCC)   . Diverticulitis   .  GERD (gastroesophageal reflux disease)   . Hydronephrosis of left kidney   . Hyperlipidemia   . Hypertension   . Left ureteral stone    obstructing  . Renal calculus or stone 2015    Past Surgical History: Past Surgical History:  Procedure Laterality Date  . 2d echo  04/04/09  . CYSTOSCOPY WITH RETROGRADE PYELOGRAM, URETEROSCOPY AND STENT PLACEMENT Left 10/11/2014   Procedure: CYSTOSCOPY WITH RETROGRADE PYELOGRAM, DIAGNOSTIC URETEROSCOPY AND STENT PLACEMENT;  Surgeon: [name], MD;  Location: [facility];  Service: Urology;  Laterality: Left;  . LUMBAR DISC SURGERY    . SPINE SURGERY      Current Medications: Current Meds  Medication Sig  . albuterol (PROVENTIL) (2.5 MG/3ML) 0.083% nebulizer solution Take 3 mLs (2.5 mg total) by nebulization 4 (four) times daily as needed for wheezing or shortness of breath.  Marland Kitchen apixaban (ELIQUIS) 5 MG TABS tablet Take 1 tablet (5 mg total) by mouth 2 (two) times daily.  Marland Kitchen atorvastatin (LIPITOR) 40 MG tablet TAKE 1 TABLET BY MOUTH  DAILY (Patient taking differently: Take 40 mg by mouth every evening. )  . Cholecalciferol (VITAMIN D) 1000 UNITS capsule Take 1,000 Units by mouth in the morning.   . Coenzyme Q10-Fish Oil-Vit E (CO-Q 10 OMEGA-3 FISH OIL) CAPS Take 1 capsule by mouth in the morning. 100mg  Capsule  Qunol  . fluticasone (FLONASE) 50 MCG/ACT nasal spray USE 1 SPRAY INTO BOTH  NOSTRILS 2 TIMES DAILY AS  NEEDED FOR ALLERGIES OR  RHINITIS (Patient taking differently: Place 1 spray into both nostrils 2 (two) times daily as needed for allergies. FOR ALLERGIES OR  RHINITIS.)  . lisinopril (ZESTRIL) 20 MG tablet TAKE 1 TABLET BY MOUTH  DAILY (Patient taking differently: Take 20 mg by mouth every morning. )  . metFORMIN (GLUCOPHAGE-XR) 500 MG 24 hr tablet TAKE 1 TABLET BY MOUTH TWO  TIMES DAILY (Patient taking differently: Take 500 mg by mouth in the morning and at bedtime. )  . metoprolol succinate (TOPROL-XL) 50 MG 24 hr tablet TAKE  1 TABLET BY MOUTH  DAILY WITH OR IMMEDIATELY  FOLLOWING A MEAL  . omeprazole (PRILOSEC) 20 MG capsule TAKE 1 CAPSULE BY MOUTH  DAILY  . pantoprazole (PROTONIX) 40 MG tablet Take 40 mg by mouth every morning.  . tamsulosin (FLOMAX) 0.4 MG CAPS capsule TAKE 2 CAPSULES BY MOUTH  DAILY AFTER SUPPER (Patient taking differently: Take 0.8 mg by mouth daily after supper. )  . [DISCONTINUED] amiodarone (PACERONE) 200 MG tablet Take 1 tablet (200 mg total) by mouth daily.  . [DISCONTINUED] apixaban (ELIQUIS) 5 MG TABS tablet Take 1 tablet (5 mg total) by mouth 2 (two) times daily.  . [DISCONTINUED] metoprolol succinate (TOPROL-XL) 50 MG 24 hr tablet TAKE 1 TABLET BY MOUTH  DAILY WITH OR IMMEDIATELY  FOLLOWING A MEAL (Patient taking differently: Take 50 mg by mouth every morning. )     Allergies:    Patient has no active allergies.   Social History: Social History   Socioeconomic History  . Marital status: Married    Spouse name: Not on file  . Number of children: 2  . Years of education: 8th  . Highest education level: 8th grade  Occupational History  . Not on file  Tobacco Use  . Smoking status: Former Smoker    Types: Cigarettes    Quit date: 06/18/1967    Years since quitting: 52.9  . Smokeless tobacco: Former Neurosurgeon    Types: Chew  Substance and Sexual Activity  . Alcohol use: No  . Drug use: No  . Sexual activity: Not Currently  Other Topics Concern  . Not on file  Social History Narrative  . Not on file   Social Determinants of Health   Financial Resource Strain: Low Risk   . Difficulty of Paying Living Expenses: Not hard at all  Food Insecurity: No Food Insecurity  . Worried About Programme researcher, broadcasting/film/video in the Last Year: Never true  . Ran Out of Food in the Last Year: Never true  Transportation Needs: No Transportation Needs  . Lack of Transportation (Medical): No  . Lack of Transportation (Non-Medical): No  Physical Activity: Inactive  . Days of Exercise per Week: 0 days  .  Minutes of Exercise per Session: 0 min  Stress: No Stress Concern Present  . Feeling of Stress : Not at all  Social Connections: Slightly Isolated  . Frequency of Communication with Friends and Family: More than three times a week  . Frequency of Social Gatherings with Friends and Family: Twice a week  . Attends Religious Services: More than 4 times per year  . Active Member of Clubs or Organizations: No  . Attends Banker Meetings: Never  . Marital Status: Married     Family History: The patient's family history includes COPD in his brother; Cancer  in his sister; Early death in his father; Heart attack in his father.  ROS:   All other ROS reviewed and negative. Pertinent positives noted in the HPI.     EKGs/Labs/Other Studies Reviewed:   The following studies were personally reviewed by me today:  EKG:  EKG is ordered today.  The ekg ordered today demonstrates sinus bradycardia, heart rate 56, first-degree AV block, 222 ms, normal intervals, no evidence of prior infarction or ischemic changes, and was personally reviewed by me.    TTE 03/27/2020 1. Left ventricular ejection fraction, by estimation, is 65 to 70%. The  left ventricle has normal function. The left ventricle has no regional  wall motion abnormalities. There is moderate left ventricular hypertrophy.  Left ventricular diastolic function  could not be evaluated.  2. Right ventricular systolic function is normal. The right ventricular  size is normal.  3. Left atrial size was severely dilated.  4. The mitral valve was not well visualized. No evidence of mitral valve  regurgitation.  5. The aortic valve was not well visualized. Aortic valve regurgitation  is not visualized.  6. Aortic dilatation noted. There is moderate dilatation of the ascending  aorta measuring 44 mm.  7. The inferior vena cava is normal in size with greater than 50%  respiratory variability, suggesting right atrial pressure of  3 mmHg.   Recent Labs: 03/28/2020: B Natriuretic Peptide 187.9 03/30/2020: Magnesium 1.9 04/13/2020: ALT 16; BUN 12; Creatinine, Ser 1.03; Hemoglobin 15.1; Platelets 220; Potassium 4.3; Sodium 139   Recent Lipid Panel    Component Value Date/Time   CHOL 117 08/18/2018 1143   TRIG 76 03/25/2020 0839   TRIG 97 11/02/2013 0949   HDL 37 (L) 08/18/2018 1143   HDL 40 11/02/2013 0949   CHOLHDL 3.2 08/18/2018 1143   CHOLHDL 2.4 09/21/2014 0620   VLDL 21 09/21/2014 0620   LDLCALC 59 08/18/2018 1143   LDLCALC 59 11/02/2013 0949    Physical Exam:   VS:  BP (!) 156/82   Pulse (!) 56   Ht 5\' 8"  (1.727 m)   Wt 236 lb 12.8 oz (107.4 kg)   BMI 36.01 kg/m    Wt Readings from Last 3 Encounters:  05/02/20 236 lb 12.8 oz (107.4 kg)  04/13/20 239 lb 8 oz (108.6 kg)  03/30/20 249 lb 9.6 oz (113.2 kg)    General: Well nourished, well developed, in no acute distress Heart: Atraumatic, normal size  Eyes: PEERLA, EOMI  Neck: Supple, no JVD Endocrine: No thryomegaly Cardiac: Normal S1, S2; RRR; no murmurs, rubs, or gallops Lungs: Clear to auscultation bilaterally, no wheezing, rhonchi or rales  Abd: Soft, nontender, no hepatomegaly  Ext: No edema, pulses 2+ Musculoskeletal: No deformities, BUE and BLE strength normal and equal Skin: Warm and dry, no rashes   Neuro: Alert and oriented to person, place, time, and situation, CNII-XII grossly intact, no focal deficits  Psych: Normal mood and affect   ASSESSMENT:   Vincent Collins is a 79 y.o. male who presents for the following: 1. Persistent atrial fibrillation ([place])   2. Adequate anticoagulation on anticoagulant therapy   3. Essential hypertension   4. Mixed hyperlipidemia     PLAN:   1. Persistent atrial fibrillation ([place]) -Diagnosed in the setting of pancreatitis.  He did have recurrent atrial fibrillation and was a bit difficult to control.  We did place him on a brief course of amiodarone.  He is in normal sinus rhythm today.  We will go  ahead and stop his amiodarone today. -His chads vas score is 4.  Given that he had multiple episodes of A. fib while in the hospital I do favor anticoagulation.  We will continue Eliquis 5 mg twice daily.  We will give him information about Xarelto as there may be a cheaper option for him.  He will let us know how much it cost and we can make the switch.  2. Adequate anticoagulation on anticoagulant therapy -Continue Eliquis as above.  3. Essential hypertension -A bit nervous today.  Continue blood pressure medications.  He will remain active and exercise more.  4. Mixed hyperlipidemia -Continue Lipitor.  We will follow-up results with him at her next visit in 6 months.  Disposition: Return in about 6 months (around 11/02/2020).  Medication Adjustments/Labs and Tests Ordered: Current medicines are reviewed at length with the patient today.  Concerns regarding medicines are outlined above.  Orders Placed This Encounter  Procedures  . EKG 12-Lead   Meds ordered this encounter  Medications  . apixaban (ELIQUIS) 5 MG TABS tablet    Sig: Take 1 tablet (5 mg total) by mouth 2 (two) times daily.    Dispense:  60 tablet    Refill:  2  . metoprolol succinate (TOPROL-XL) 50 MG 24 hr tablet    Sig: TAKE 1 TABLET BY MOUTH  DAILY WITH OR IMMEDIATELY  FOLLOWING A MEAL    Dispense:  90 tablet    Refill:  0    Patient Instructions  Medication Instructions:  Stop Amiodarone   *If you need a refill on your cardiac medications before your next appointment, please call your pharmacy*    Follow-Up: At [facility], you and your health needs are our priority.  As part of our continuing mission to provide you with exceptional heart care, we have created designated Provider Care Teams.  These Care Teams include your primary Cardiologist (physician) and Advanced Practice Providers (APPs -  Physician Assistants and Nurse Practitioners) who all work together to provide you with the care you need, when  you need it.  We recommend signing up for the patient portal called "MyChart".  Sign up information is provided on this After Visit Summary.  MyChart is used to connect with patients for Virtual Visits (Telemedicine).  Patients are able to view lab/test results, encounter notes, upcoming appointments, etc.  Non-urgent messages can be sent to your provider as well.   To learn more about what you can do with MyChart, go to [email or web address].    Your next appointment:   6 month(s)  The format for your next appointment:   In Person  Provider:   Lennie Odor, MD         Signed, Lenna Gilford. Flora Lipps, MD [facility]  [address] [place], [place] [address] [phone]  05/02/2020 9:59 AM

## 2020-04-30 ENCOUNTER — Ambulatory Visit: Payer: Medicare Other | Admitting: Cardiovascular Disease

## 2020-05-02 ENCOUNTER — Ambulatory Visit: Payer: Medicare Other | Admitting: Cardiovascular Disease

## 2020-05-02 ENCOUNTER — Encounter: Payer: Self-pay | Admitting: Cardiovascular Disease

## 2020-05-02 ENCOUNTER — Other Ambulatory Visit: Payer: Self-pay

## 2020-05-02 VITALS — BP 156/82 | HR 56 | Ht 68.0 in | Wt 236.8 lb

## 2020-05-02 DIAGNOSIS — I10 Essential (primary) hypertension: Secondary | ICD-10-CM | POA: Diagnosis not present

## 2020-05-02 DIAGNOSIS — E782 Mixed hyperlipidemia: Secondary | ICD-10-CM

## 2020-05-02 DIAGNOSIS — Z7901 Long term (current) use of anticoagulants: Secondary | ICD-10-CM

## 2020-05-02 DIAGNOSIS — I4819 Other persistent atrial fibrillation: Secondary | ICD-10-CM

## 2020-05-02 MED ORDER — APIXABAN 5 MG PO TABS: 5.0000 mg | ORAL_TABLET | Freq: Two times a day (BID) | ORAL | 2 refills | Status: DC

## 2020-05-02 MED ORDER — METOPROLOL SUCCINATE ER 50 MG PO TB24: ORAL_TABLET | ORAL | 0 refills | Status: DC

## 2020-05-02 NOTE — Patient Instructions (Signed)
Medication Instructions:  Stop Amiodarone   *If you need a refill on your cardiac medications before your next appointment, please call your pharmacy*    Follow-Up: At [facility], you and your health needs are our priority.  As part of our continuing mission to provide you with exceptional heart care, we have created designated Provider Care Teams.  These Care Teams include your primary Cardiologist (physician) and Advanced Practice Providers (APPs -  Physician Assistants and Nurse Practitioners) who all work together to provide you with the care you need, when you need it.  We recommend signing up for the patient portal called "MyChart".  Sign up information is provided on this After Visit Summary.  MyChart is used to connect with patients for Virtual Visits (Telemedicine).  Patients are able to view lab/test results, encounter notes, upcoming appointments, etc.  Non-urgent messages can be sent to your provider as well.   To learn more about what you can do with MyChart, go to [email or web address].    Your next appointment:   6 month(s)  The format for your next appointment:   In Person  Provider:   Lennie Odor, MD

## 2020-05-15 ENCOUNTER — Other Ambulatory Visit: Payer: Self-pay | Admitting: Surgery

## 2020-05-15 DIAGNOSIS — K859 Acute pancreatitis without necrosis or infection, unspecified: Secondary | ICD-10-CM | POA: Diagnosis not present

## 2020-05-18 ENCOUNTER — Other Ambulatory Visit: Payer: Self-pay

## 2020-05-18 ENCOUNTER — Encounter: Payer: Self-pay | Admitting: Family Medicine

## 2020-05-18 ENCOUNTER — Ambulatory Visit (INDEPENDENT_AMBULATORY_CARE_PROVIDER_SITE_OTHER): Payer: Medicare Other | Admitting: Family Medicine

## 2020-05-18 VITALS — BP 144/79 | HR 63 | Temp 98.1°F | Ht 68.0 in | Wt 237.4 lb

## 2020-05-18 DIAGNOSIS — I10 Essential (primary) hypertension: Secondary | ICD-10-CM

## 2020-05-18 DIAGNOSIS — E785 Hyperlipidemia, unspecified: Secondary | ICD-10-CM | POA: Diagnosis not present

## 2020-05-18 DIAGNOSIS — E1169 Type 2 diabetes mellitus with other specified complication: Secondary | ICD-10-CM | POA: Diagnosis not present

## 2020-05-18 DIAGNOSIS — J449 Chronic obstructive pulmonary disease, unspecified: Secondary | ICD-10-CM | POA: Diagnosis not present

## 2020-05-18 LAB — BAYER DCA HB A1C WAIVED: HB A1C (BAYER DCA - WAIVED): 6.8 % (ref <7.0)

## 2020-05-18 NOTE — Progress Notes (Signed)
BP (!) 144/79   Pulse 63   Temp 98.1 F (36.7 C) (Temporal)   Ht 5\' 8"  (1.727 m)   Wt 237 lb 6 oz (107.7 kg)   BMI 36.09 kg/m    Subjective:   Patient ID: Vincent Collins, male    DOB: 26-Feb-1941, 79 y.o.   MRN: [id number]  HPI: Vincent Collins is a 79 y.o. male presenting on 05/18/2020 for Medical Management of Chronic Issues   HPI Type 2 diabetes mellitus Patient comes in today for recheck of his diabetes. Patient has been currently taking metformin. Patient is currently on an ACE inhibitor/ARB. Patient has not seen an ophthalmologist this year. Patient denies any issues with their feet. The symptom started onset as an adult hypertension and hyperlipidemia ARE RELATED TO DM   Hyperlipidemia Patient is coming in for recheck of his hyperlipidemia. The patient is currently taking atorvastatin and fish oil. They deny any issues with myalgias or history of liver damage from it. They deny any focal numbness or weakness or chest pain.   Hypertension Patient is currently on lisinopril, and their blood pressure today is 144/79. Patient denies any lightheadedness or dizziness. Patient denies headaches, blurred vision, chest pains, shortness of breath, or weakness. Denies any side effects from medication and is content with current medication.   COPD Patient is coming in for COPD recheck today.  He is currently on albuterol.  He has a mild chronic cough but denies any major coughing spells or wheezing spells.  He has 0nighttime symptoms per week and 0daytime symptoms per week currently.   Relevant past medical, surgical, family and social history reviewed and updated as indicated. Interim medical history since our last visit reviewed. Allergies and medications reviewed and updated.  Review of Systems  Constitutional: Negative for chills and fever.  Eyes: Negative for visual disturbance.  Respiratory: Negative for shortness of breath and wheezing.   Cardiovascular: Negative for chest pain and  leg swelling.  Musculoskeletal: Negative for back pain and gait problem.  Skin: Negative for rash.  Neurological: Negative for dizziness, weakness and light-headedness.  All other systems reviewed and are negative.   Per HPI unless specifically indicated above   Allergies as of 05/18/2020   No Known Allergies     Medication List       Accurate as of May 18, 2020 10:24 AM. If you have any questions, ask your nurse or doctor.        STOP taking these medications   metoprolol succinate 50 MG 24 hr tablet Commonly known as: TOPROL-XL Stopped by: May 20, 2020 Debany Vantol, MD   pantoprazole 40 MG tablet Commonly known as: PROTONIX Stopped by: Elige Radon, MD     TAKE these medications   albuterol (2.5 MG/3ML) 0.083% nebulizer solution Commonly known as: PROVENTIL Take 3 mLs (2.5 mg total) by nebulization 4 (four) times daily as needed for wheezing or shortness of breath.   apixaban 5 MG Tabs tablet Commonly known as: ELIQUIS Take 1 tablet (5 mg total) by mouth 2 (two) times daily.   atorvastatin 40 MG tablet Commonly known as: LIPITOR TAKE 1 TABLET BY MOUTH  DAILY What changed: when to take this   CO-Q 10 Omega-3 Fish Oil Caps Take 1 capsule by mouth in the morning. 100mg  Capsule Qunol   fluticasone 50 MCG/ACT nasal spray Commonly known as: FLONASE USE 1 SPRAY INTO BOTH  NOSTRILS 2 TIMES DAILY AS  NEEDED FOR ALLERGIES OR  RHINITIS What changed: See the  new instructions.   lisinopril 20 MG tablet Commonly known as: ZESTRIL TAKE 1 TABLET BY MOUTH  DAILY What changed: when to take this   metFORMIN 500 MG 24 hr tablet Commonly known as: GLUCOPHAGE-XR TAKE 1 TABLET BY MOUTH TWO  TIMES DAILY What changed: when to take this   omeprazole 20 MG capsule Commonly known as: PRILOSEC TAKE 1 CAPSULE BY MOUTH  DAILY   tamsulosin 0.4 MG Caps capsule Commonly known as: FLOMAX TAKE 2 CAPSULES BY MOUTH  DAILY AFTER SUPPER What changed: See the new instructions.     Vitamin D 1000 units capsule Take 1,000 Units by mouth in the morning.        Objective:   BP (!) 144/79   Pulse 63   Temp 98.1 F (36.7 C) (Temporal)   Ht 5\' 8"  (1.727 m)   Wt 237 lb 6 oz (107.7 kg)   BMI 36.09 kg/m   Wt Readings from Last 3 Encounters:  05/18/20 237 lb 6 oz (107.7 kg)  05/02/20 236 lb 12.8 oz (107.4 kg)  04/13/20 239 lb 8 oz (108.6 kg)    Physical Exam Vitals and nursing note reviewed.  Constitutional:      General: He is not in acute distress.    Appearance: He is well-developed. He is not diaphoretic.  Eyes:     General: No scleral icterus.    Conjunctiva/sclera: Conjunctivae normal.  Neck:     Thyroid: No thyromegaly.  Cardiovascular:     Rate and Rhythm: Normal rate and regular rhythm.     Heart sounds: Normal heart sounds. No murmur.  Pulmonary:     Effort: Pulmonary effort is normal. No respiratory distress.     Breath sounds: Normal breath sounds. No wheezing.  Musculoskeletal:        General: Normal range of motion.     Cervical back: Neck supple.  Lymphadenopathy:     Cervical: No cervical adenopathy.  Skin:    General: Skin is warm and dry.     Findings: No rash.  Neurological:     Mental Status: He is alert and oriented to person, place, and time.     Coordination: Coordination normal.  Psychiatric:        Behavior: Behavior normal.       Assessment & Plan:   Problem List Items Addressed This Visit      Cardiovascular and Mediastinum   Essential hypertension     Respiratory   COPD (chronic obstructive pulmonary disease) (HCC)     Endocrine   Hyperlipidemia associated with type 2 diabetes mellitus ([place])   Type 2 diabetes mellitus ([place]) - Primary   Relevant Orders   Bayer DCA Hb A1c Waived (Completed)   Microalbumin / creatinine urine ratio (Completed)    Continue current medication, patient would like to go back to work because he feels like his breathing everything is doing a lot better, we will let him go on a  part-time basis and see how it goes and if it goes well and he can continue from there.  Continue to monitor weights and breathing.  Follow up plan: Return in about 3 months (around 08/18/2020), or if symptoms worsen or fail to improve, for Diabetes COPD cholesterol.  Counseling provided for all of the vaccine components Orders Placed This Encounter  Procedures  . Bayer DCA Hb A1c Waived  . Microalbumin / creatinine urine ratio    Caryl Pina, MD [facility] 05/18/2020, 10:24 AM

## 2020-05-19 LAB — MICROALBUMIN / CREATININE URINE RATIO
Creatinine, Urine: 50.3 mg/dL
Microalb/Creat Ratio: 11 mg/g creat (ref 0–29)
Microalbumin, Urine: 5.7 ug/mL

## 2020-06-16 ENCOUNTER — Other Ambulatory Visit: Payer: Self-pay | Admitting: Family Medicine

## 2020-06-16 DIAGNOSIS — N401 Enlarged prostate with lower urinary tract symptoms: Secondary | ICD-10-CM

## 2020-06-16 DIAGNOSIS — E1159 Type 2 diabetes mellitus with other circulatory complications: Secondary | ICD-10-CM

## 2020-06-16 DIAGNOSIS — J019 Acute sinusitis, unspecified: Secondary | ICD-10-CM

## 2020-06-16 DIAGNOSIS — K2950 Unspecified chronic gastritis without bleeding: Secondary | ICD-10-CM

## 2020-07-29 DIAGNOSIS — E785 Hyperlipidemia, unspecified: Secondary | ICD-10-CM | POA: Diagnosis not present

## 2020-07-29 DIAGNOSIS — K7689 Other specified diseases of liver: Secondary | ICD-10-CM | POA: Diagnosis not present

## 2020-07-29 DIAGNOSIS — R1084 Generalized abdominal pain: Secondary | ICD-10-CM | POA: Diagnosis not present

## 2020-07-29 DIAGNOSIS — R140 Abdominal distension (gaseous): Secondary | ICD-10-CM | POA: Diagnosis not present

## 2020-07-29 DIAGNOSIS — K5732 Diverticulitis of large intestine without perforation or abscess without bleeding: Secondary | ICD-10-CM | POA: Diagnosis not present

## 2020-07-29 DIAGNOSIS — K429 Umbilical hernia without obstruction or gangrene: Secondary | ICD-10-CM | POA: Diagnosis not present

## 2020-07-29 DIAGNOSIS — R109 Unspecified abdominal pain: Secondary | ICD-10-CM | POA: Diagnosis not present

## 2020-07-29 DIAGNOSIS — R112 Nausea with vomiting, unspecified: Secondary | ICD-10-CM | POA: Diagnosis not present

## 2020-07-29 DIAGNOSIS — N281 Cyst of kidney, acquired: Secondary | ICD-10-CM | POA: Diagnosis not present

## 2020-07-29 DIAGNOSIS — K5730 Diverticulosis of large intestine without perforation or abscess without bleeding: Secondary | ICD-10-CM | POA: Diagnosis not present

## 2020-07-29 DIAGNOSIS — I10 Essential (primary) hypertension: Secondary | ICD-10-CM | POA: Diagnosis not present

## 2020-07-30 ENCOUNTER — Telehealth: Payer: Self-pay | Admitting: Family Medicine

## 2020-07-30 NOTE — Telephone Encounter (Signed)
Appt made.  Spouse aware of date and time

## 2020-07-31 ENCOUNTER — Emergency Department (HOSPITAL_COMMUNITY): Payer: Medicare Other

## 2020-07-31 ENCOUNTER — Other Ambulatory Visit: Payer: Self-pay

## 2020-07-31 ENCOUNTER — Encounter (HOSPITAL_COMMUNITY): Payer: Self-pay | Admitting: Emergency Medicine

## 2020-07-31 ENCOUNTER — Inpatient Hospital Stay (HOSPITAL_COMMUNITY)
Admission: EM | Admit: 2020-07-31 | Discharge: 2020-08-04 | DRG: 418 | Disposition: A | Discharge status: Home / Self Care | Payer: Medicare Other | Attending: Internal Medicine | Admitting: Internal Medicine

## 2020-07-31 ENCOUNTER — Inpatient Hospital Stay (HOSPITAL_COMMUNITY): Payer: Medicare Other

## 2020-07-31 DIAGNOSIS — K858 Other acute pancreatitis without necrosis or infection: Secondary | ICD-10-CM

## 2020-07-31 DIAGNOSIS — N179 Acute kidney failure, unspecified: Secondary | ICD-10-CM | POA: Diagnosis not present

## 2020-07-31 DIAGNOSIS — Z7984 Long term (current) use of oral hypoglycemic drugs: Secondary | ICD-10-CM

## 2020-07-31 DIAGNOSIS — I517 Cardiomegaly: Secondary | ICD-10-CM | POA: Diagnosis not present

## 2020-07-31 DIAGNOSIS — R945 Abnormal results of liver function studies: Secondary | ICD-10-CM | POA: Diagnosis not present

## 2020-07-31 DIAGNOSIS — R079 Chest pain, unspecified: Secondary | ICD-10-CM | POA: Diagnosis not present

## 2020-07-31 DIAGNOSIS — R7401 Elevation of levels of liver transaminase levels: Secondary | ICD-10-CM | POA: Diagnosis not present

## 2020-07-31 DIAGNOSIS — K810 Acute cholecystitis: Secondary | ICD-10-CM | POA: Diagnosis not present

## 2020-07-31 DIAGNOSIS — K6389 Other specified diseases of intestine: Secondary | ICD-10-CM | POA: Diagnosis not present

## 2020-07-31 DIAGNOSIS — Z8249 Family history of ischemic heart disease and other diseases of the circulatory system: Secondary | ICD-10-CM

## 2020-07-31 DIAGNOSIS — E785 Hyperlipidemia, unspecified: Secondary | ICD-10-CM | POA: Diagnosis present

## 2020-07-31 DIAGNOSIS — N281 Cyst of kidney, acquired: Secondary | ICD-10-CM | POA: Diagnosis not present

## 2020-07-31 DIAGNOSIS — K819 Cholecystitis, unspecified: Secondary | ICD-10-CM | POA: Diagnosis not present

## 2020-07-31 DIAGNOSIS — Z825 Family history of asthma and other chronic lower respiratory diseases: Secondary | ICD-10-CM | POA: Diagnosis not present

## 2020-07-31 DIAGNOSIS — K8020 Calculus of gallbladder without cholecystitis without obstruction: Secondary | ICD-10-CM | POA: Diagnosis not present

## 2020-07-31 DIAGNOSIS — Z87442 Personal history of urinary calculi: Secondary | ICD-10-CM | POA: Diagnosis not present

## 2020-07-31 DIAGNOSIS — K7689 Other specified diseases of liver: Secondary | ICD-10-CM | POA: Diagnosis not present

## 2020-07-31 DIAGNOSIS — Z7901 Long term (current) use of anticoagulants: Secondary | ICD-10-CM | POA: Diagnosis not present

## 2020-07-31 DIAGNOSIS — K859 Acute pancreatitis without necrosis or infection, unspecified: Secondary | ICD-10-CM | POA: Diagnosis not present

## 2020-07-31 DIAGNOSIS — I10 Essential (primary) hypertension: Secondary | ICD-10-CM | POA: Diagnosis not present

## 2020-07-31 DIAGNOSIS — E860 Dehydration: Secondary | ICD-10-CM | POA: Diagnosis present

## 2020-07-31 DIAGNOSIS — Z794 Long term (current) use of insulin: Secondary | ICD-10-CM | POA: Diagnosis not present

## 2020-07-31 DIAGNOSIS — I480 Paroxysmal atrial fibrillation: Secondary | ICD-10-CM

## 2020-07-31 DIAGNOSIS — K8309 Other cholangitis: Secondary | ICD-10-CM | POA: Diagnosis not present

## 2020-07-31 DIAGNOSIS — Z20822 Contact with and (suspected) exposure to covid-19: Secondary | ICD-10-CM | POA: Diagnosis not present

## 2020-07-31 DIAGNOSIS — K8000 Calculus of gallbladder with acute cholecystitis without obstruction: Secondary | ICD-10-CM | POA: Diagnosis not present

## 2020-07-31 DIAGNOSIS — K429 Umbilical hernia without obstruction or gangrene: Secondary | ICD-10-CM | POA: Diagnosis present

## 2020-07-31 DIAGNOSIS — D696 Thrombocytopenia, unspecified: Secondary | ICD-10-CM | POA: Diagnosis present

## 2020-07-31 DIAGNOSIS — J449 Chronic obstructive pulmonary disease, unspecified: Secondary | ICD-10-CM | POA: Diagnosis present

## 2020-07-31 DIAGNOSIS — Z79899 Other long term (current) drug therapy: Secondary | ICD-10-CM

## 2020-07-31 DIAGNOSIS — K851 Biliary acute pancreatitis without necrosis or infection: Principal | ICD-10-CM | POA: Diagnosis present

## 2020-07-31 DIAGNOSIS — J9811 Atelectasis: Secondary | ICD-10-CM | POA: Diagnosis not present

## 2020-07-31 DIAGNOSIS — Z419 Encounter for procedure for purposes other than remedying health state, unspecified: Secondary | ICD-10-CM

## 2020-07-31 DIAGNOSIS — K219 Gastro-esophageal reflux disease without esophagitis: Secondary | ICD-10-CM | POA: Diagnosis not present

## 2020-07-31 DIAGNOSIS — R1011 Right upper quadrant pain: Secondary | ICD-10-CM

## 2020-07-31 DIAGNOSIS — K5730 Diverticulosis of large intestine without perforation or abscess without bleeding: Secondary | ICD-10-CM | POA: Diagnosis not present

## 2020-07-31 DIAGNOSIS — K828 Other specified diseases of gallbladder: Secondary | ICD-10-CM | POA: Diagnosis not present

## 2020-07-31 DIAGNOSIS — E119 Type 2 diabetes mellitus without complications: Secondary | ICD-10-CM | POA: Diagnosis present

## 2020-07-31 DIAGNOSIS — J90 Pleural effusion, not elsewhere classified: Secondary | ICD-10-CM | POA: Diagnosis not present

## 2020-07-31 LAB — BASIC METABOLIC PANEL
Anion gap: 11 (ref 5–15)
BUN: 23 mg/dL (ref 8–23)
CO2: 24 mmol/L (ref 22–32)
Calcium: 8.3 mg/dL — ABNORMAL LOW (ref 8.9–10.3)
Chloride: 98 mmol/L (ref 98–111)
Creatinine, Ser: 1.71 mg/dL — ABNORMAL HIGH (ref 0.61–1.24)
GFR calc Af Amer: 43 mL/min — ABNORMAL LOW (ref >60)
GFR calc non Af Amer: 38 mL/min — ABNORMAL LOW (ref >60)
Glucose, Bld: 186 mg/dL — ABNORMAL HIGH (ref 70–99)
Potassium: 4 mmol/L (ref 3.5–5.1)
Sodium: 133 mmol/L — ABNORMAL LOW (ref 135–145)

## 2020-07-31 LAB — TROPONIN I (HIGH SENSITIVITY)
Troponin I (High Sensitivity): 16 ng/L (ref <18)
Troponin I (High Sensitivity): 17 ng/L (ref <18)

## 2020-07-31 LAB — HEPATIC FUNCTION PANEL
ALT: 516 U/L — ABNORMAL HIGH (ref 0–44)
AST: 218 U/L — ABNORMAL HIGH (ref 15–41)
Albumin: 3 g/dL — ABNORMAL LOW (ref 3.5–5.0)
Alkaline Phosphatase: 123 U/L (ref 38–126)
Bilirubin, Direct: 5.4 mg/dL — ABNORMAL HIGH (ref 0.0–0.2)
Indirect Bilirubin: 2.8 mg/dL — ABNORMAL HIGH (ref 0.3–0.9)
Total Bilirubin: 8.2 mg/dL — ABNORMAL HIGH (ref 0.3–1.2)
Total Protein: 5.6 g/dL — ABNORMAL LOW (ref 6.5–8.1)

## 2020-07-31 LAB — PROTIME-INR
INR: 1.3 — ABNORMAL HIGH (ref 0.8–1.2)
Prothrombin Time: 15.7 seconds — ABNORMAL HIGH (ref 11.4–15.2)

## 2020-07-31 LAB — SARS CORONAVIRUS 2 BY RT PCR (HOSPITAL ORDER, PERFORMED IN ~~LOC~~ HOSPITAL LAB): SARS Coronavirus 2: NEGATIVE

## 2020-07-31 LAB — CBC
HCT: 46.6 % (ref 39.0–52.0)
Hemoglobin: 15.7 g/dL (ref 13.0–17.0)
MCH: 30.3 pg (ref 26.0–34.0)
MCHC: 33.7 g/dL (ref 30.0–36.0)
MCV: 90 fL (ref 80.0–100.0)
Platelets: 135 10*3/uL — ABNORMAL LOW (ref 150–400)
RBC: 5.18 MIL/uL (ref 4.22–5.81)
RDW: 13.5 % (ref 11.5–15.5)
WBC: 12.3 10*3/uL — ABNORMAL HIGH (ref 4.0–10.5)
nRBC: 0 % (ref 0.0–0.2)

## 2020-07-31 LAB — LIPASE, BLOOD: Lipase: 1725 U/L — ABNORMAL HIGH (ref 11–51)

## 2020-07-31 IMAGING — CT CT ABD-PELV W/ CM
2 of 5 series · 16 of 46 positions shown, 18 images · IV contrast (APPLIED)
Comparison: [DATE]

CLINICAL DATA: Epigastric pain, tenderness to palpation

EXAM:
CT ABDOMEN AND PELVIS WITH CONTRAST
TECHNIQUE: Multidetector CT imaging of the abdomen and pelvis was performed
using the standard protocol following bolus administration of
intravenous contrast.
CONTRAST:  80mL OMNIPAQUE IOHEXOL 300 MG/ML  SOLN

[Series 3: abdomen 5.0 · axial · 0.92mm/px · z∈[-619,-169]mm · 13 of 104 slices shown, 15 images]
[im 7/104  soft-tissue]
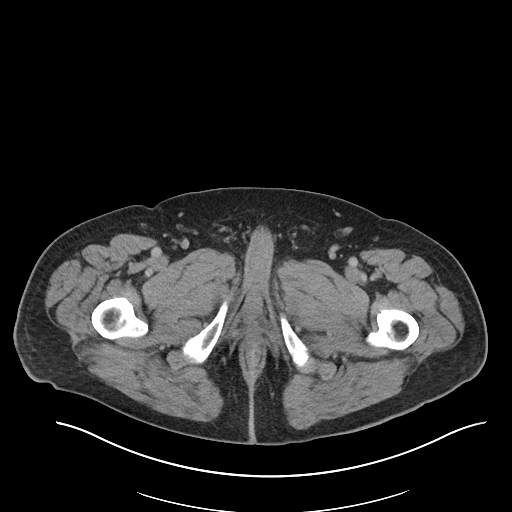
[im 7/104  bone]
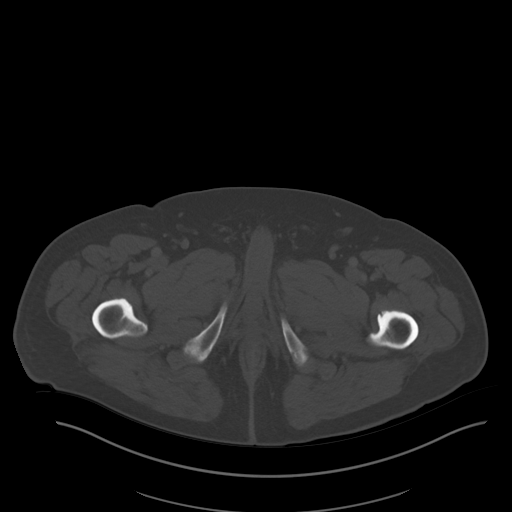
[im 14/104  soft-tissue]
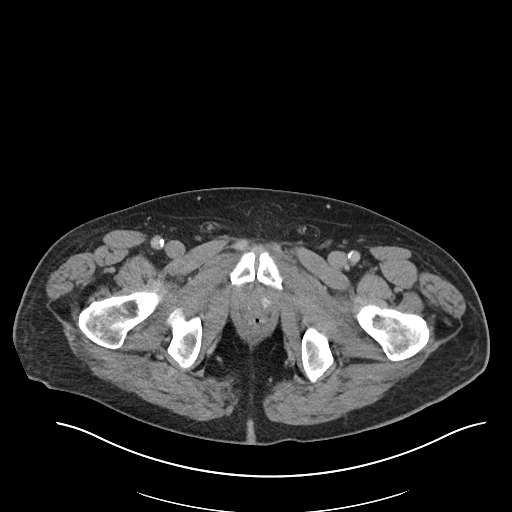
[im 21/104  soft-tissue]
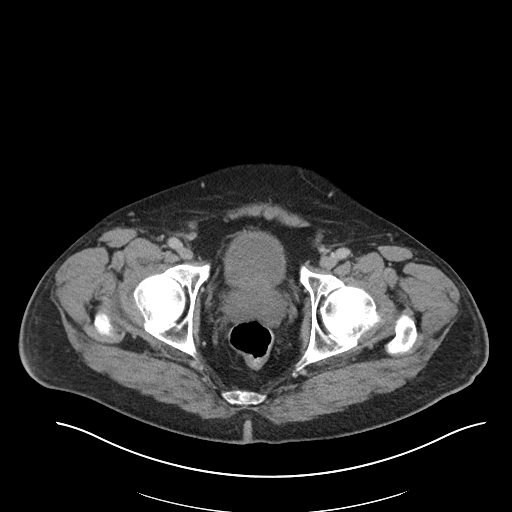
[im 28/104  soft-tissue]
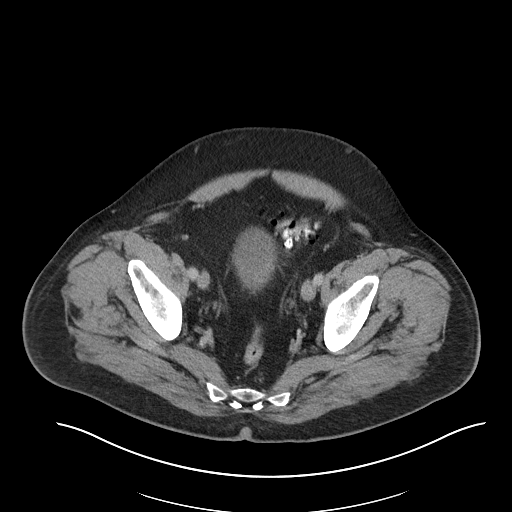
[im 35/104  soft-tissue]
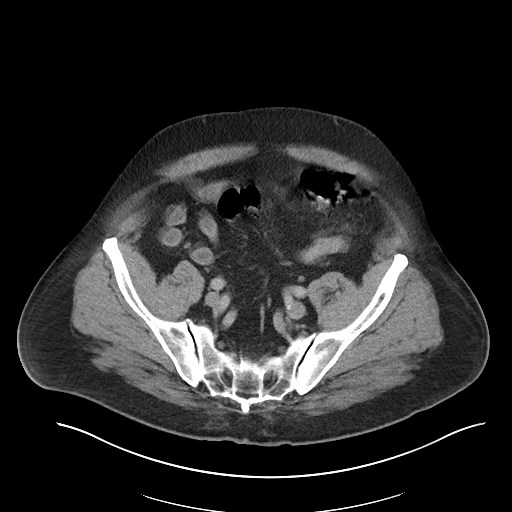
[im 42/104  soft-tissue]
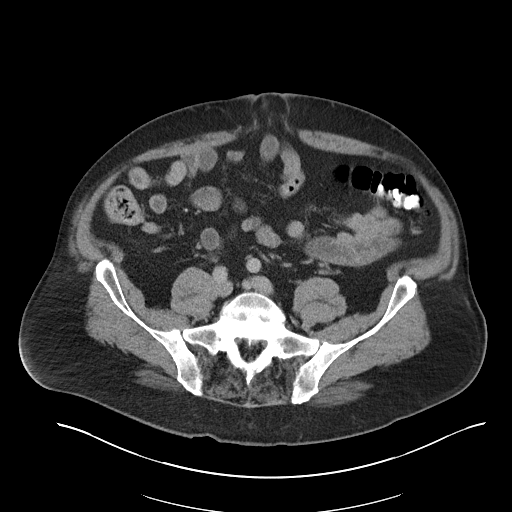
[im 55/104  soft-tissue]
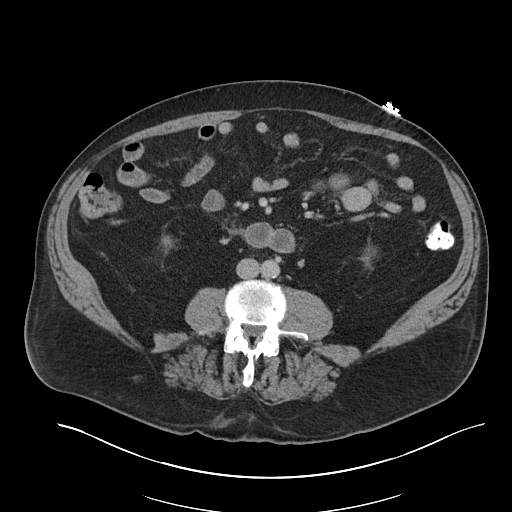
[im 62/104  soft-tissue]
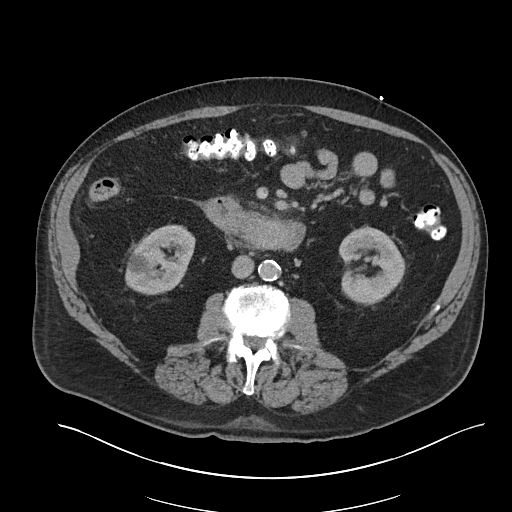
[im 69/104  soft-tissue]
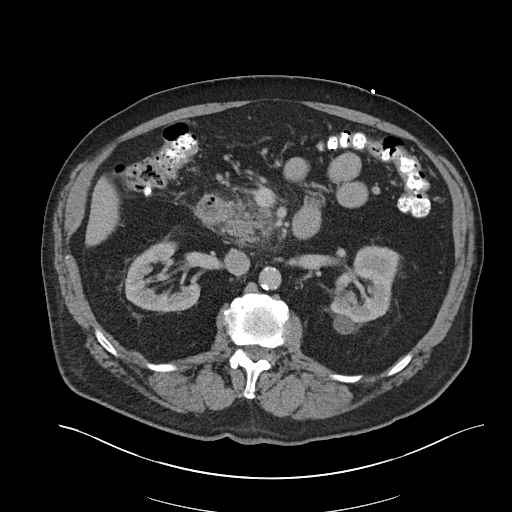
[im 69/104  bone]
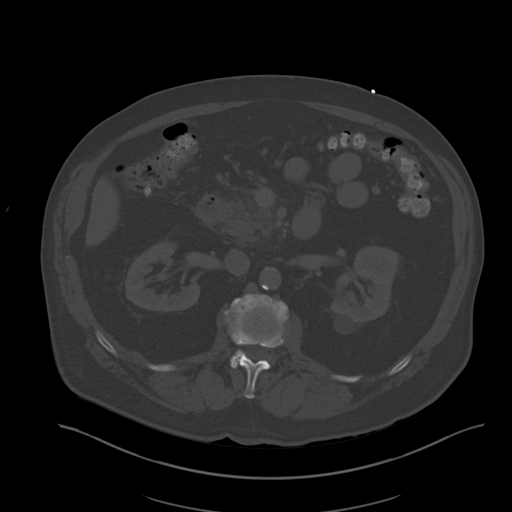
[im 76/104  soft-tissue]
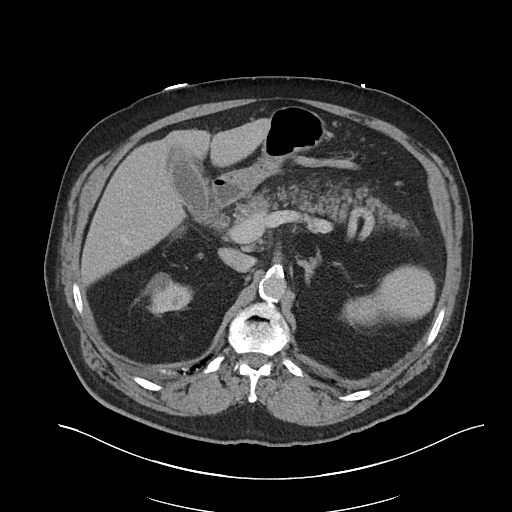
[im 83/104  soft-tissue]
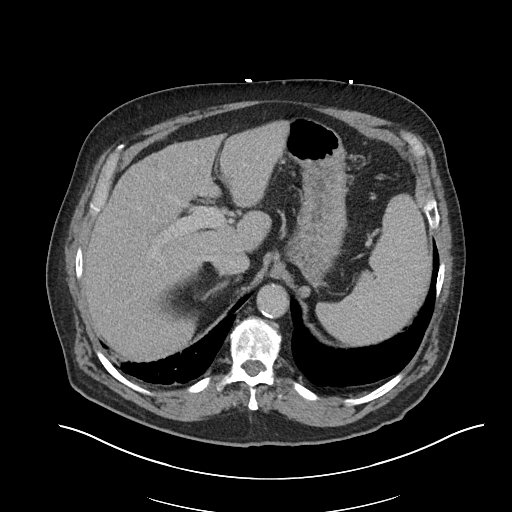
[im 90/104  soft-tissue]
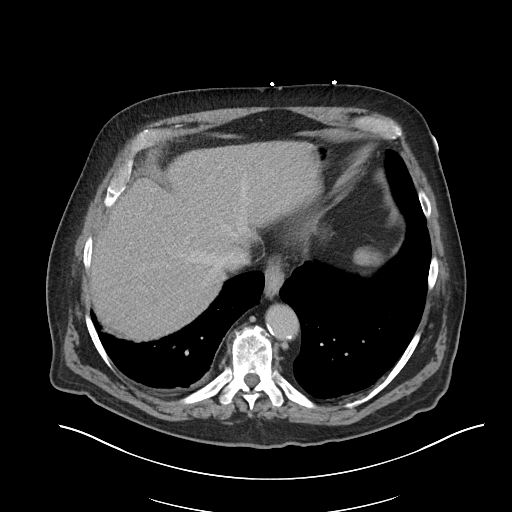
[im 97/104  soft-tissue]
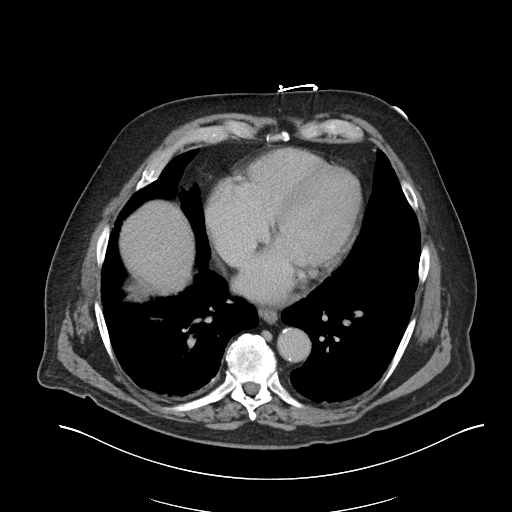

[Series 6: abdomen 3.0 mpr cor · coronal · 0.87mm/px · 3 of 116 slices shown]
[im 39/116  soft-tissue]
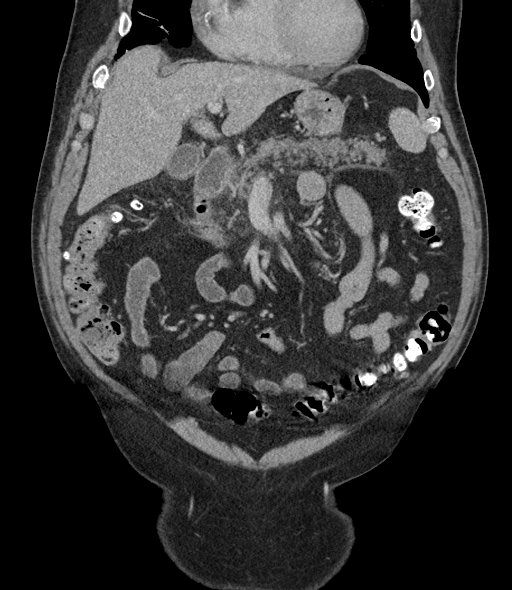
[im 52/116  soft-tissue]
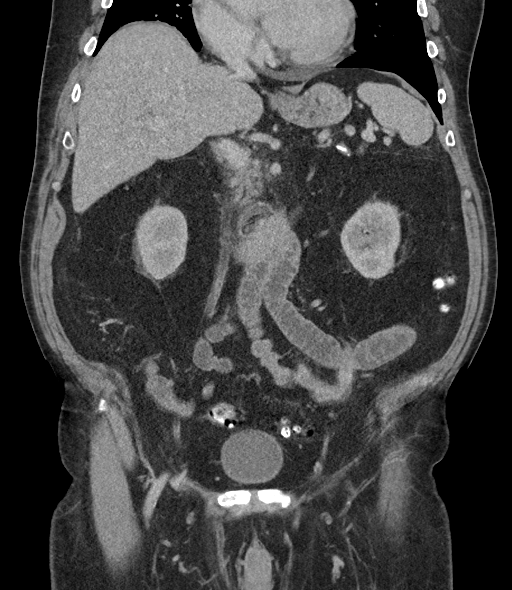
[im 64/116  soft-tissue]
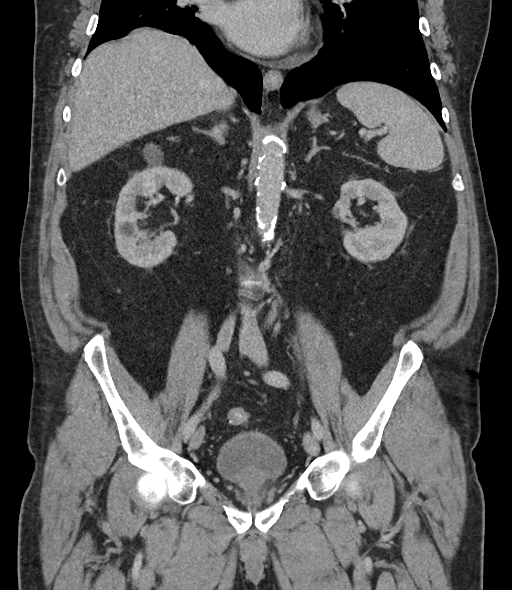

[16 of 46 positions shown; findings below may reference images not displayed]

FINDINGS: Lower chest: No acute abnormality. Scarring of the included
bilateral lung bases.

Hepatobiliary: No solid liver abnormality is seen. No gallstones or
biliary dilatation. Thickening of the gallbladder wall, new compared
to prior examination. (Series 6, image 28).

Pancreas: Mild inflammatory fat stranding about the inferior head of
the pancreas adjacent to the duodenum as below. No pancreatic ductal
dilatation or surrounding inflammatory changes.

Spleen: Normal in size without significant abnormality.

Adrenals/Urinary Tract: Adrenal glands are unremarkable. Kidneys are
normal, without renal calculi, solid lesion, or hydronephrosis.
Bladder is unremarkable.

Stomach/Bowel: Stomach is within normal limits. There is new
inflammatory fat stranding about the descending and transverse
portions of the duodenum (series 6, image 43). Incidental
diverticulum of the transverse duodenum. Appendix appears normal. No
evidence of bowel wall thickening, distention, or inflammatory
changes. Colonic diverticulosis.

Vascular/Lymphatic: Aortic atherosclerosis. No enlarged abdominal or
pelvic lymph nodes.

Reproductive: Mild prostatomegaly.

Other: Fat containing umbilical hernia.  No abdominopelvic ascites.

Musculoskeletal: No acute or significant osseous findings.
IMPRESSION: 1. There is new inflammatory fat stranding about the descending and
transverse portions of the duodenum. Findings are consistent with
either nonspecific infectious or inflammatory duodenitis or
alternately groove pancreatitis.
2. Thickening of the gallbladder wall, new compared to prior
examination, and of uncertain significance, possibly reactive to
adjacent inflammation.
3. Overall constellation of findings could be related to a passed
gallstone and gallstone pancreatitis.
4. Colonic diverticulosis without evidence for diverticulitis.
5. Mild prostatomegaly.
6. Aortic Atherosclerosis ([5Q]-[5Q]).

## 2020-07-31 IMAGING — MR MR ABDOMEN WO/W CM MRCP
14 of 22 series · 22 of 48 positions shown · IV contrast (y GAD)
Comparison: CT scan [DATE]

CLINICAL DATA: Acute pancreatitis.

EXAM:
MRI ABDOMEN WITHOUT AND WITH CONTRAST (INCLUDING MRCP)
TECHNIQUE: Multiplanar multisequence MR imaging of the abdomen was performed
both before and after the administration of intravenous contrast.
Heavily T2-weighted images of the biliary and pancreatic ducts were
obtained, and three-dimensional MRCP images were rendered by post
processing.
CONTRAST:  10mL GADAVIST GADOBUTROL 1 MMOL/ML IV SOLN

[Series 4: cor ssfse nav · coronal · 6.0mm · 0.98mm/px · 2 of 53 slices shown]
[im 1/53]
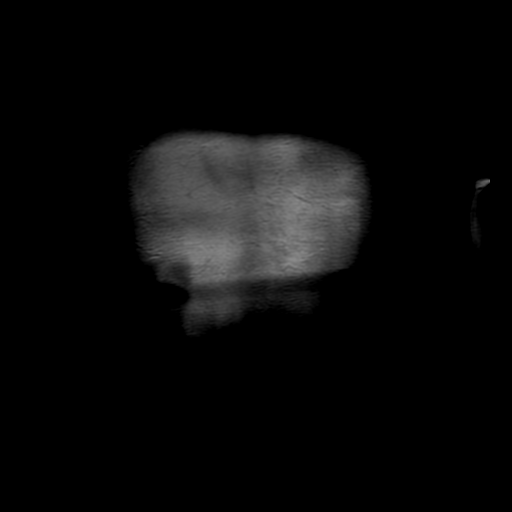
[im 53/53]
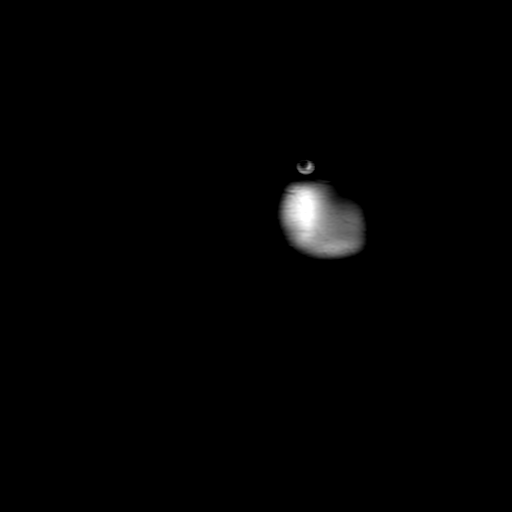

[Series 5: ax ssfse nav · axial · 6.0mm · 0.98mm/px · 1 of 53 slices shown]
[im 1/53]
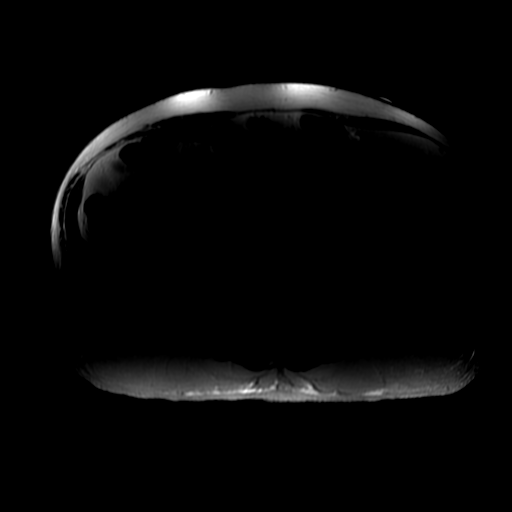

[Series 7: DWI b500 · axial · 8.0mm · 1.95mm/px · 1 of 64 slices shown]
[im 1/64]
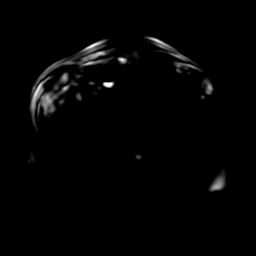

[Series 8: T2 fat-sat · axial · 6.0mm · 0.98mm/px · 1 of 42 slices shown]
[im 1/42]
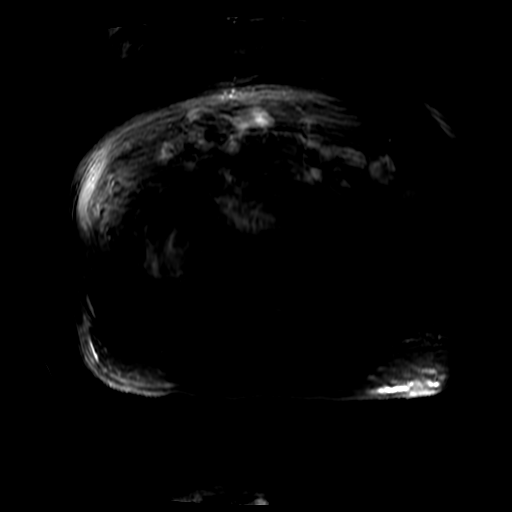

[Series 10: radial 2d thick · coronal · 40.0mm · 0.86mm/px · 1 of 6 slices shown]
[im 1/6]
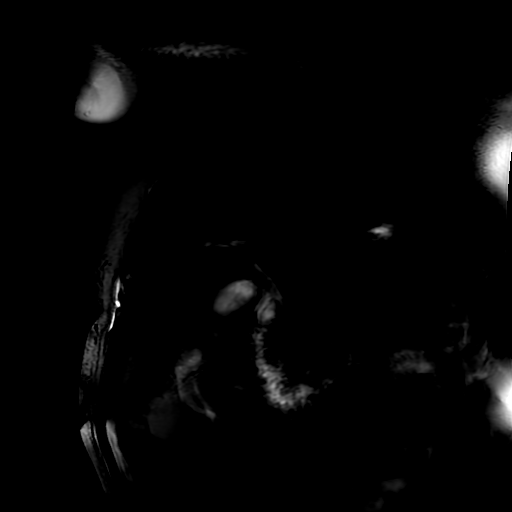

[Series 11: bSSFP · coronal · 6.0mm · 0.98mm/px · 1 of 53 slices shown]
[im 1/53]
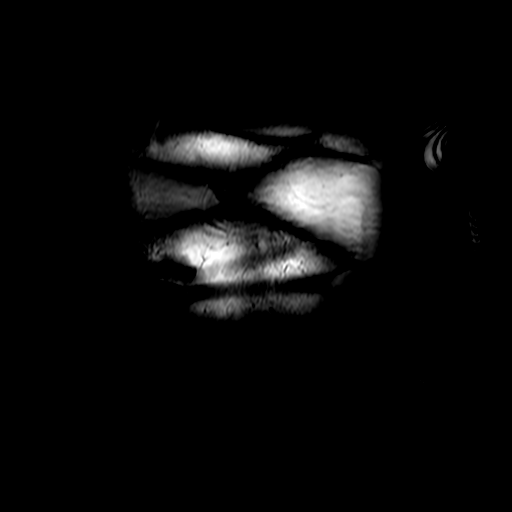

[Series 13: T1 dynamic · coronal · 3.4mm · 1.95mm/px · 4 of 192 slices shown (1 of 3)]
[im 1/192]
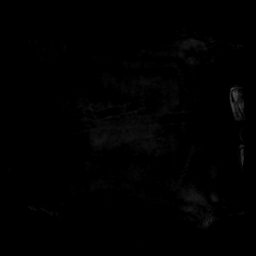
[im 64/192]
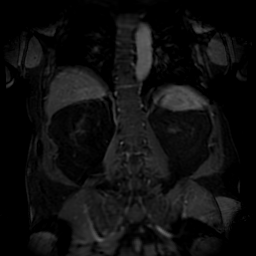
[im 128/192]
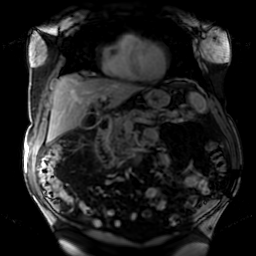
[im 192/192]
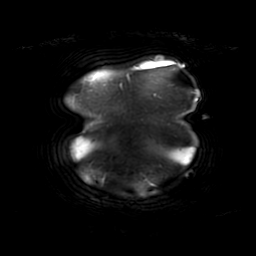

[Series 650: projection images · sagittal · 1.2mm · 0.74mm/px · 1 of 19 slices shown (1 of 2)]
[im 1/19]
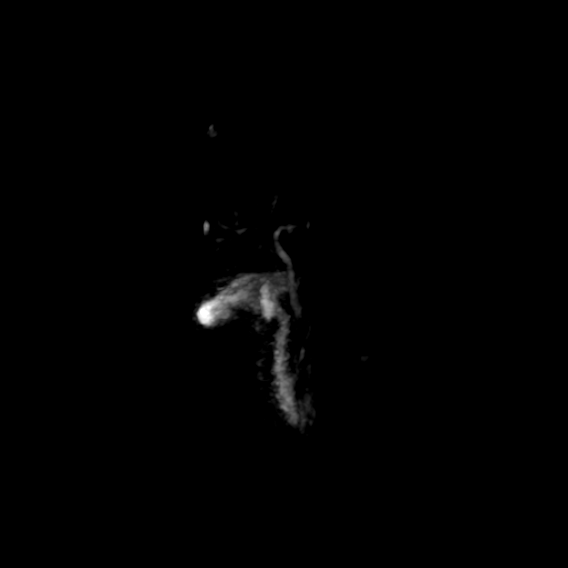

[Series 651: projection images · axial · 1.2mm · 0.74mm/px · 1 of 21 slices shown (2 of 2)]
[im 1/21]
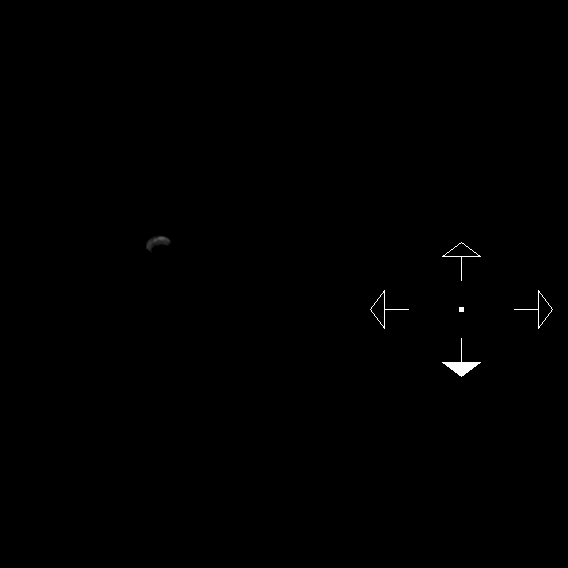

[Series 750: ADC · axial · 8.0mm · 1.95mm/px · 1 of 32 slices shown]
[im 1/32]
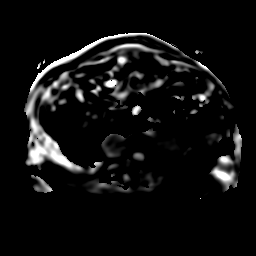

[Series 901: T1 dynamic · axial · 5.0mm · 0.98mm/px · z∈[+15,+293]mm · 2 of 112 slices shown (2 of 3)]
[im 1/112]
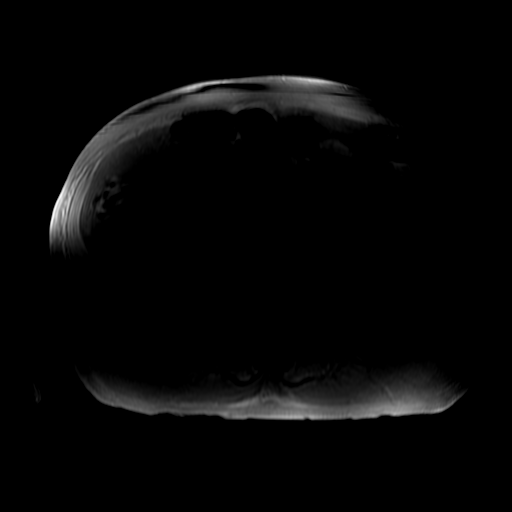
[im 112/112]
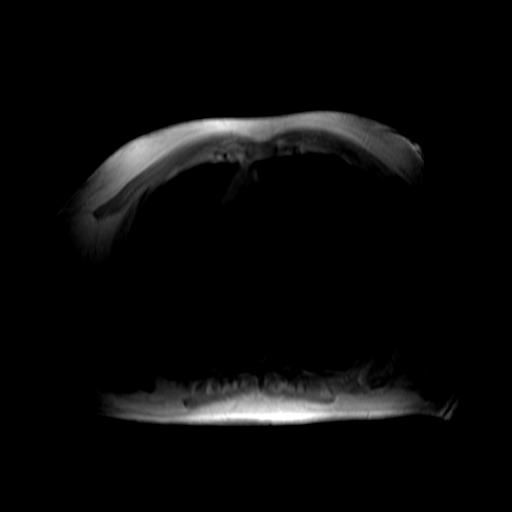

[Series 902: T1 dynamic · axial · 5.0mm · 0.98mm/px · z∈[+15,+293]mm · 2 of 112 slices shown (3 of 3)]
[im 1/112]
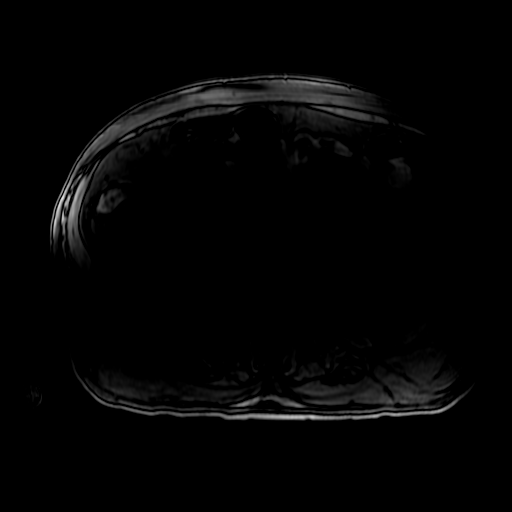
[im 112/112]
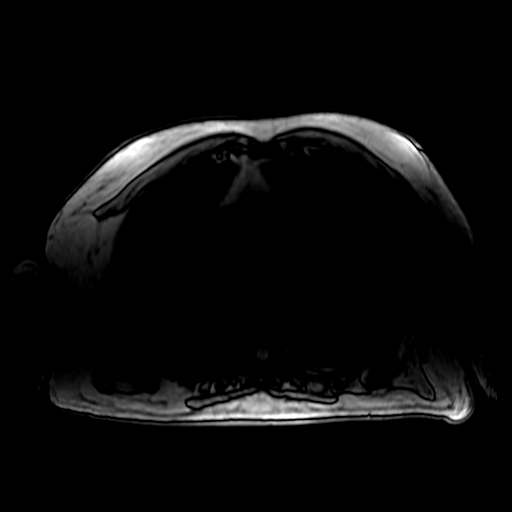

[Series 1200: T1 dynamic post-contrast · axial · non-contrast · 4.0mm · 0.98mm/px · z∈[-21,+257]mm · 3 of 140 slices shown (1 of 2)]
[im 1/140]
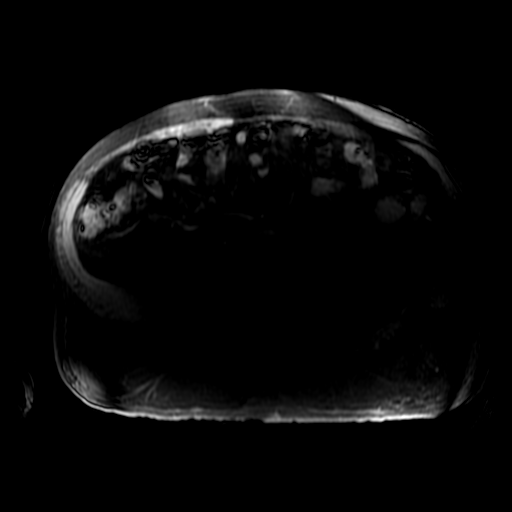
[im 70/140]
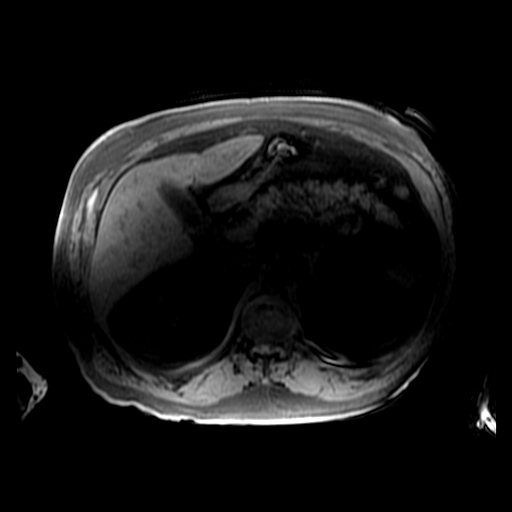
[im 140/140]
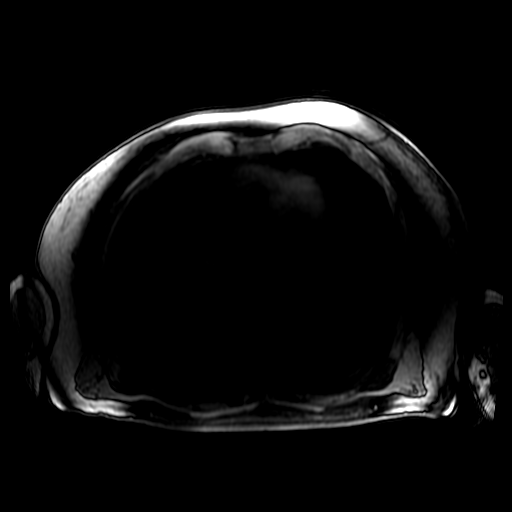

[Series 1201: T1 dynamic post-contrast · axial · non-contrast · 4.0mm · 0.98mm/px · 1 of 140 slices shown (2 of 2)]
[im 1/140]
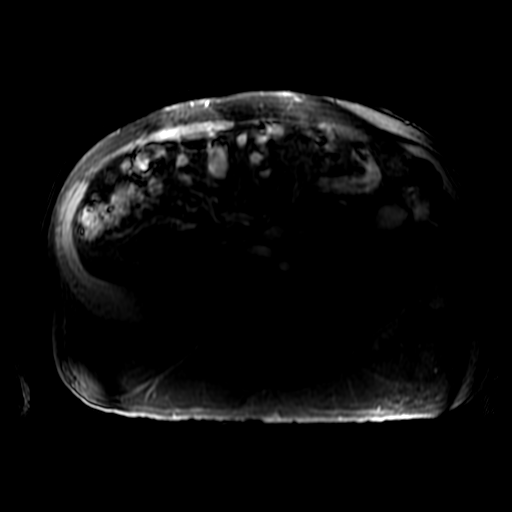

[22 of 48 positions shown; findings below may reference images not displayed]

FINDINGS: Lower chest: The lung bases are clear of an acute process. No
pleural effusions. No pericardial effusion.

Hepatobiliary: Small hepatic cysts are noted. No worrisome hepatic
lesions. No intrahepatic biliary dilatation. Normal caliber and
course of the common bile duct. No common bile duct stones are
identified but exam is limited by motion artifact.

The gallbladder is abnormal. There is gallbladder wall thickening,
gallbladder wall edema and some debris. No obvious or definite
gallstones.

Pancreas: MR findings consistent with acute pancreatitis. Diffuse
inflammation/edema involving the pancreas and peripancreatic
tissues. There is also moderate inflammation of the second and third
portions of the duodenum. Normal pancreatic enhancement. No findings
for pancreatic necrosis.

Spleen:  Normal size.  No focal lesions.

Adrenals/Urinary Tract: The adrenal glands and kidneys are
unremarkable. Small renal cysts are noted.

Stomach/Bowel: The stomach is unremarkable. Inflammation of the
duodenum is secondary to pancreatitis. The visualized small bowel
and colon are grossly normal.

Vascular/Lymphatic: The aorta and branch vessels are patent. The
major venous structures are patent. No retroperitoneal adenopathy.

Other:  No ascites or abdominal wall hernia.

Musculoskeletal: No significant bony findings.
IMPRESSION: 1. MR findings consistent with acute pancreatitis. No findings for
pancreatic necrosis.
2. Normal caliber and course of the common bile duct. No common bile
duct stones are identified but exam is limited by motion artifact.
3. Abnormal gallbladder with gallbladder wall thickening, edema and
enhancement and some debris. No obvious or definite gallstones.
4. Simple appearing hepatic and renal cysts.

## 2020-07-31 IMAGING — CR DG CHEST 2V
2 series · 2 of 2 positions shown · non-contrast
Comparison: [DATE]

CLINICAL DATA: Chest pain

EXAM:
CHEST - 2 VIEW

[chest pa]
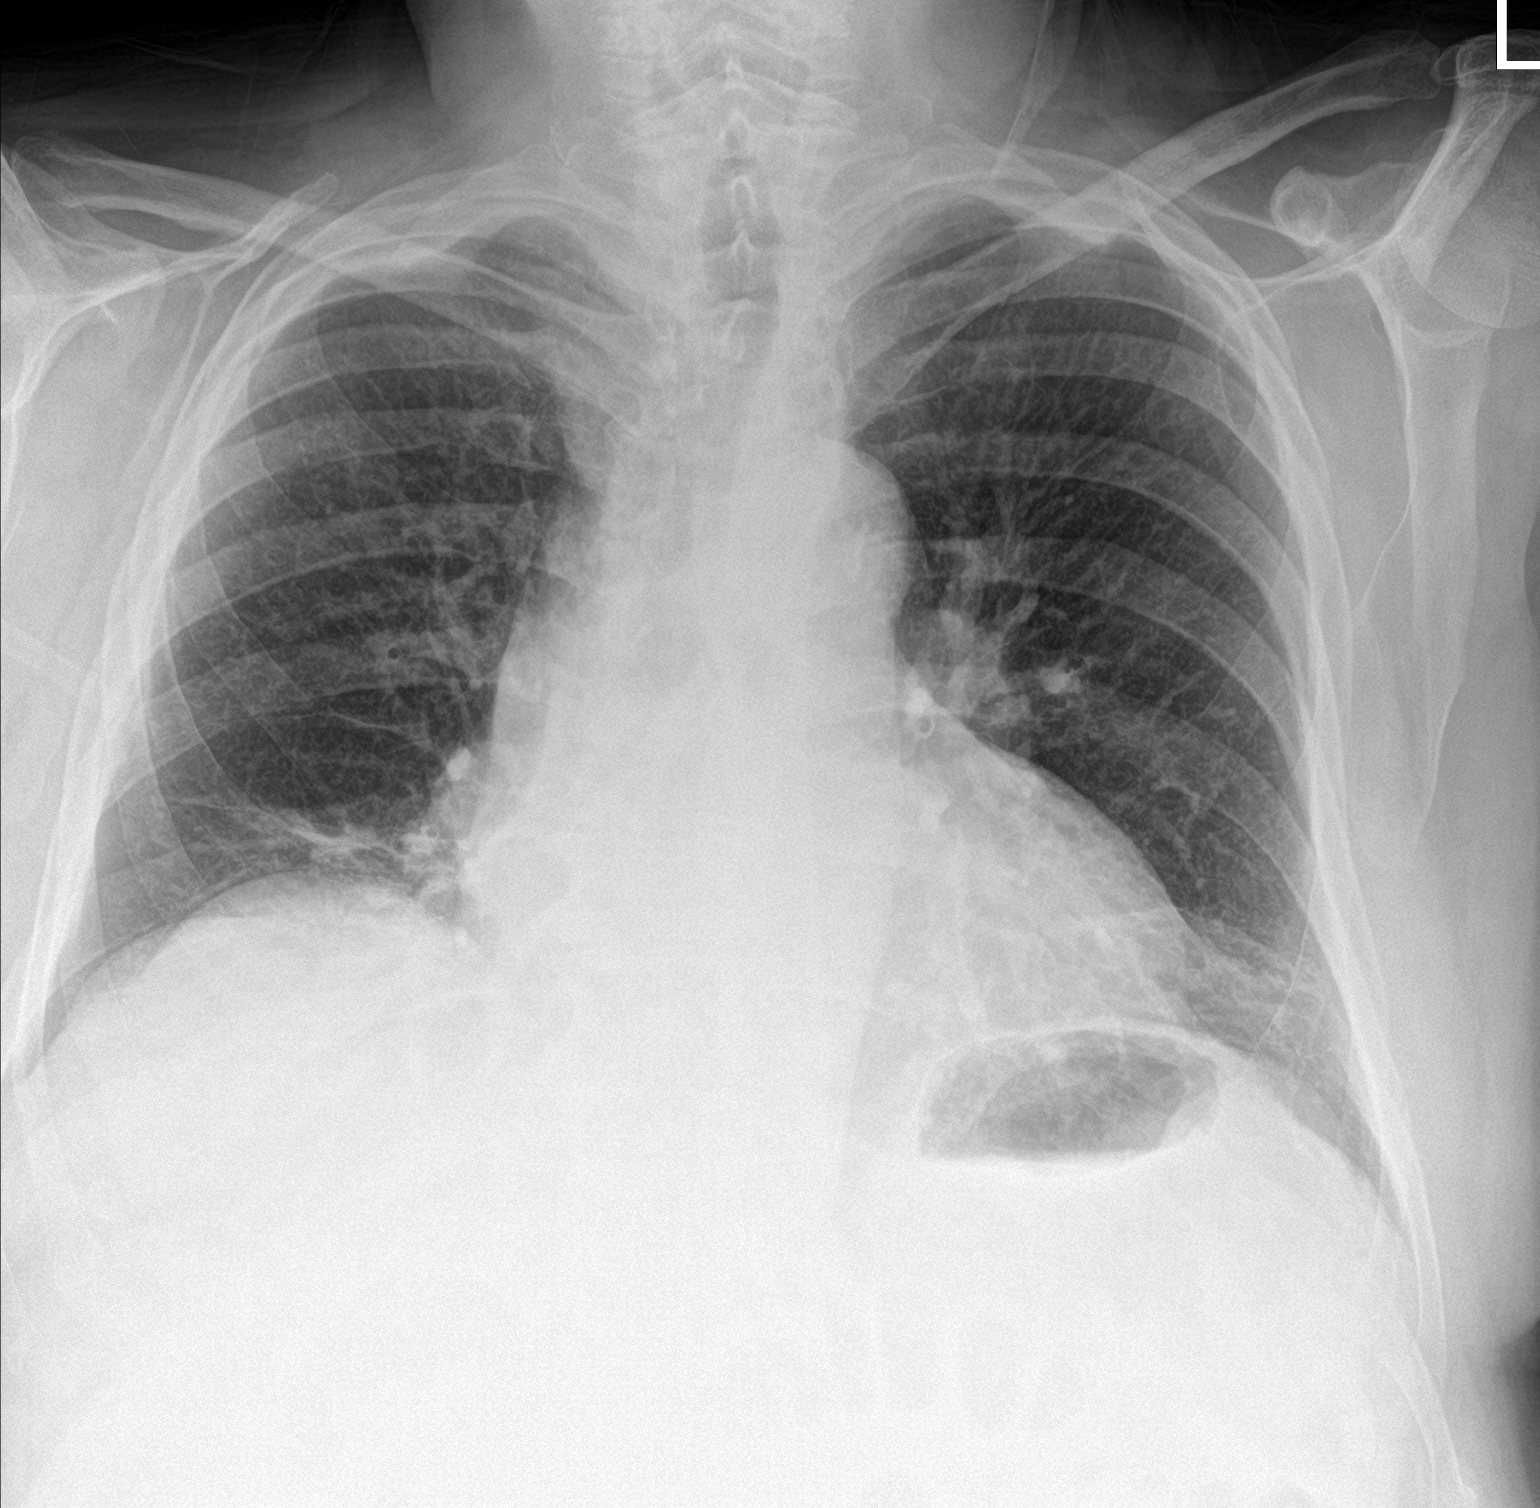

[chest lat]
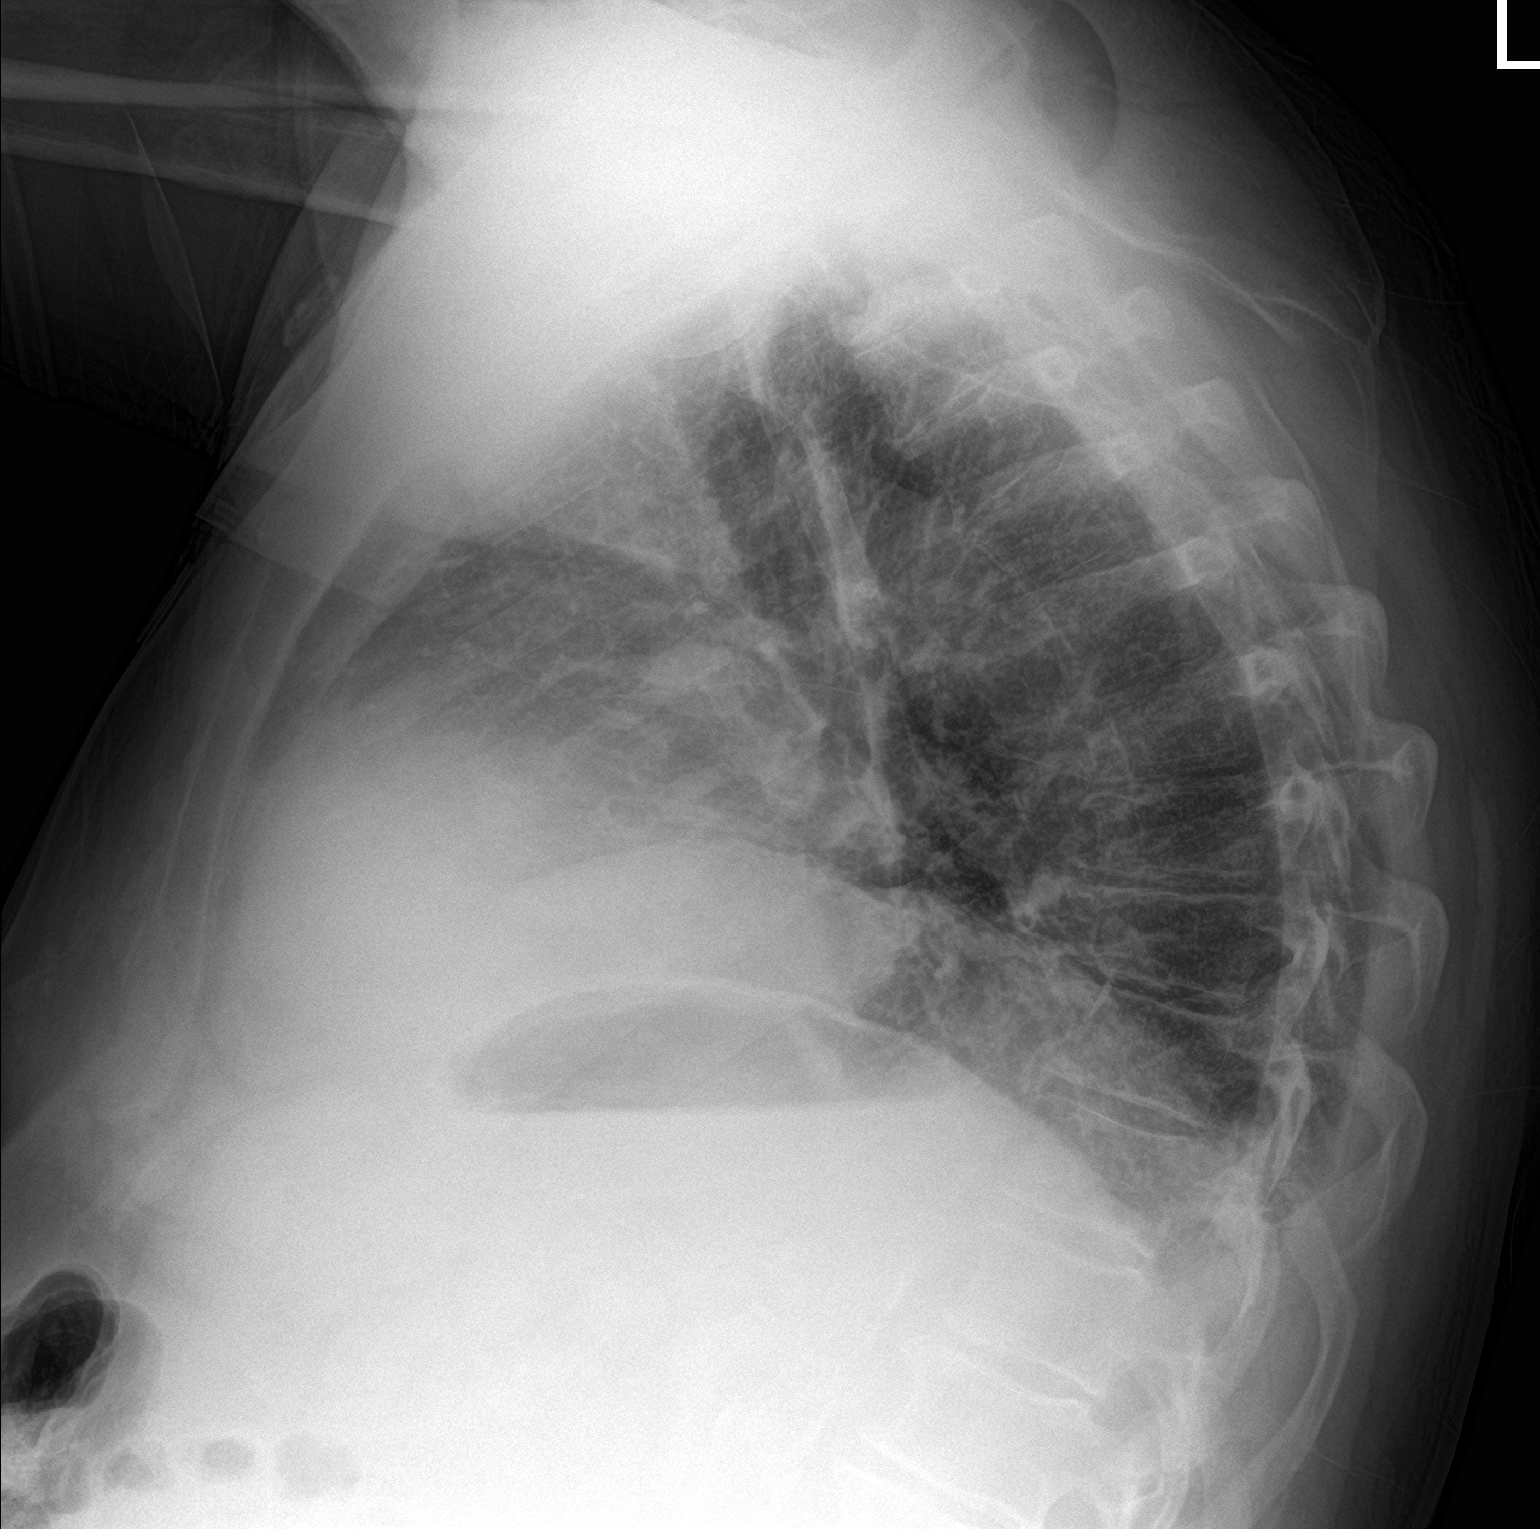

[2 of 2 positions shown; findings below may reference images not displayed]

FINDINGS: Subsegmental atelectasis at the bases. No consolidation or pleural
effusion. Borderline cardiomegaly with aortic atherosclerosis. No
pneumothorax.
IMPRESSION: Mildly low lung volumes with subsegmental atelectasis at the bases.

## 2020-07-31 IMAGING — MR MR 3D RECON AT SCANNER
14 of 22 series · 22 of 48 positions shown · IV contrast (y GAD)
Comparison: CT scan [DATE]

CLINICAL DATA: Acute pancreatitis.

EXAM:
MRI ABDOMEN WITHOUT AND WITH CONTRAST (INCLUDING MRCP)
TECHNIQUE: Multiplanar multisequence MR imaging of the abdomen was performed
both before and after the administration of intravenous contrast.
Heavily T2-weighted images of the biliary and pancreatic ducts were
obtained, and three-dimensional MRCP images were rendered by post
processing.
CONTRAST:  10mL GADAVIST GADOBUTROL 1 MMOL/ML IV SOLN

[Series 4: cor ssfse nav · coronal · 6.0mm · 0.98mm/px · 2 of 53 slices shown]
[im 1/53]
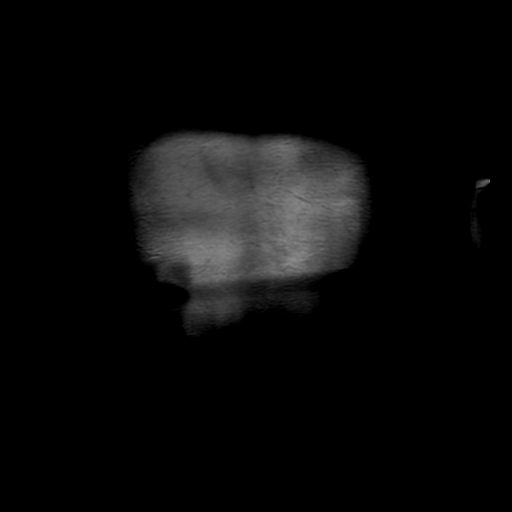
[im 53/53]
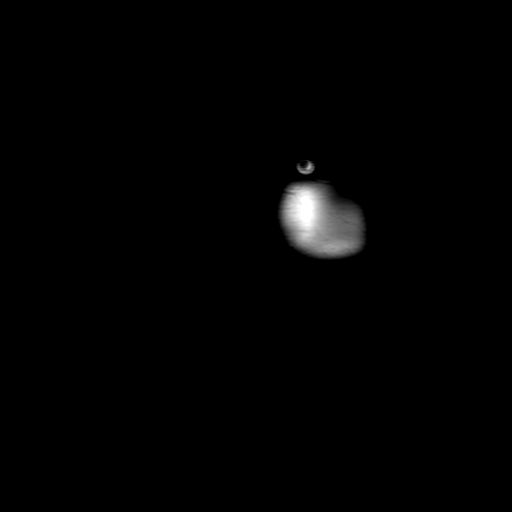

[Series 5: ax ssfse nav · axial · 6.0mm · 0.98mm/px · 1 of 53 slices shown]
[im 1/53]
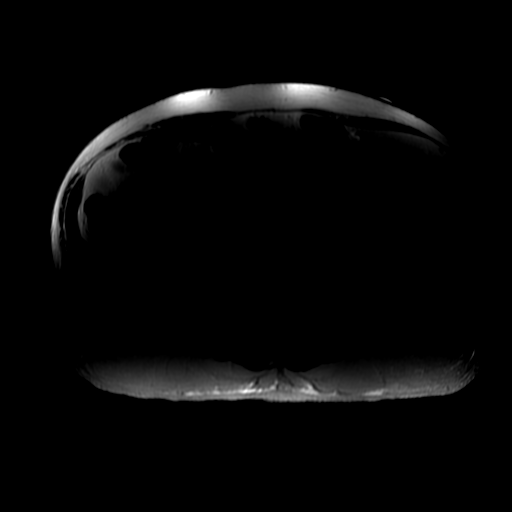

[Series 7: DWI b500 · axial · 8.0mm · 1.95mm/px · 1 of 64 slices shown]
[im 1/64]
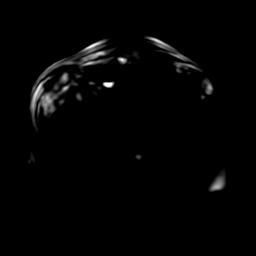

[Series 8: T2 fat-sat · axial · 6.0mm · 0.98mm/px · 1 of 42 slices shown]
[im 1/42]
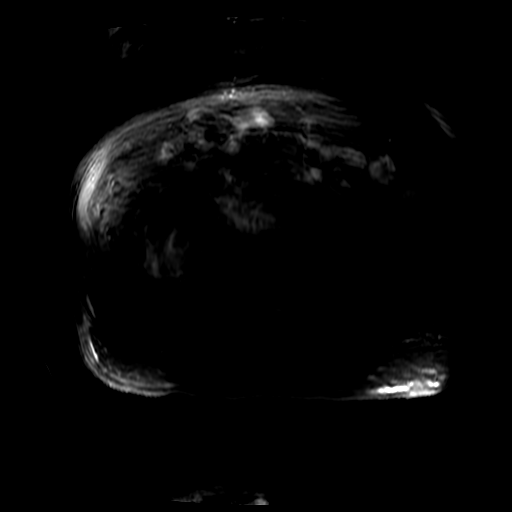

[Series 10: radial 2d thick · coronal · 40.0mm · 0.86mm/px · 1 of 6 slices shown]
[im 1/6]
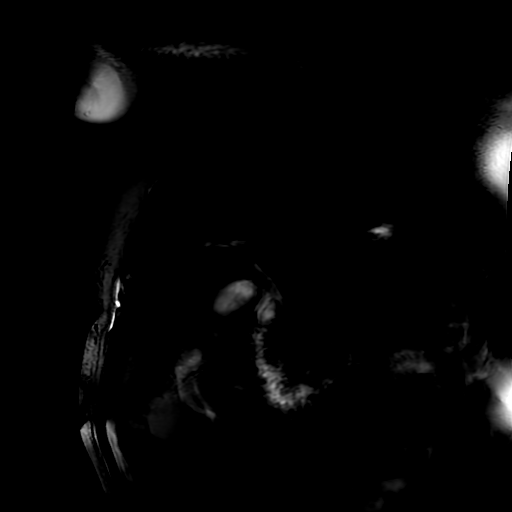

[Series 11: bSSFP · coronal · 6.0mm · 0.98mm/px · 1 of 53 slices shown]
[im 1/53]
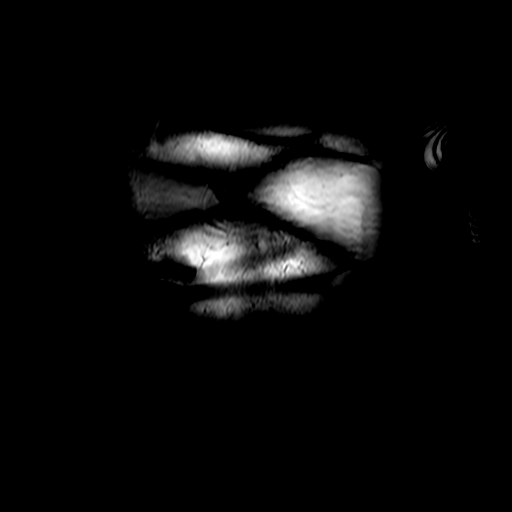

[Series 13: T1 dynamic · coronal · 3.4mm · 1.95mm/px · 4 of 192 slices shown (1 of 3)]
[im 1/192]
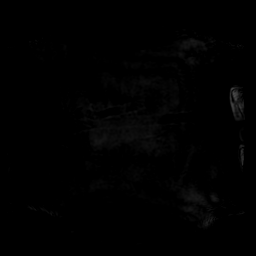
[im 64/192]
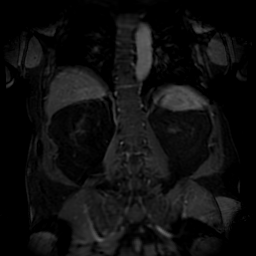
[im 128/192]
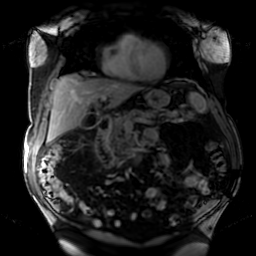
[im 192/192]
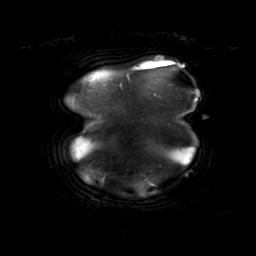

[Series 650: projection images · sagittal · 1.2mm · 0.74mm/px · 1 of 19 slices shown (1 of 2)]
[im 1/19]
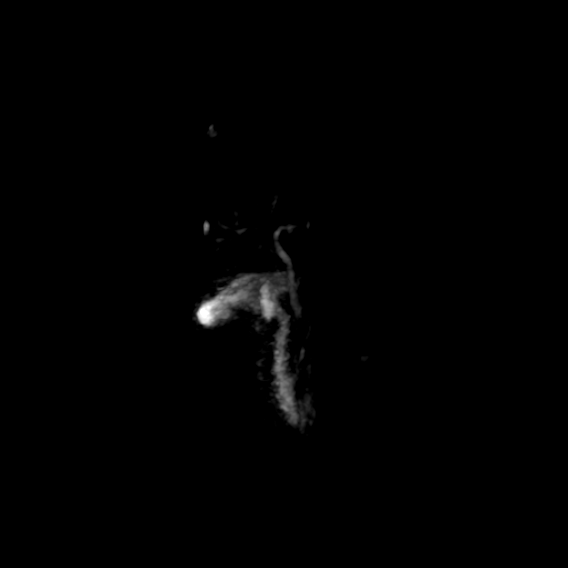

[Series 651: projection images · axial · 1.2mm · 0.74mm/px · 1 of 21 slices shown (2 of 2)]
[im 1/21]
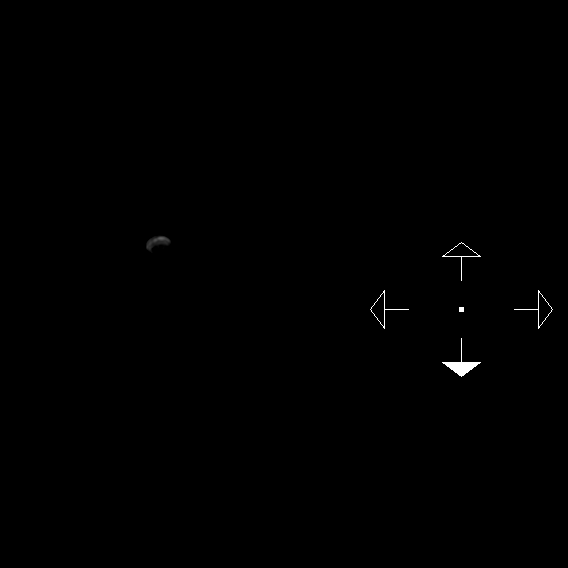

[Series 750: ADC · axial · 8.0mm · 1.95mm/px · 1 of 32 slices shown]
[im 1/32]
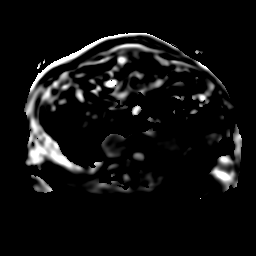

[Series 901: T1 dynamic · axial · 5.0mm · 0.98mm/px · z∈[+15,+293]mm · 2 of 112 slices shown (2 of 3)]
[im 1/112]
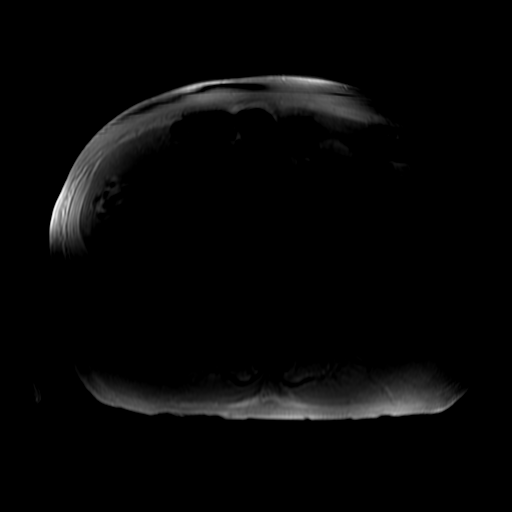
[im 112/112]
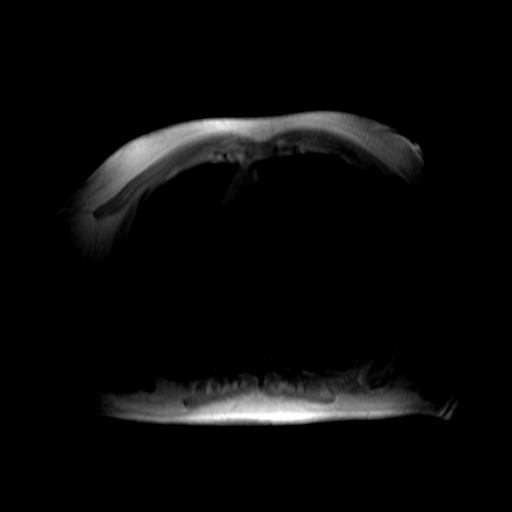

[Series 902: T1 dynamic · axial · 5.0mm · 0.98mm/px · z∈[+15,+293]mm · 2 of 112 slices shown (3 of 3)]
[im 1/112]
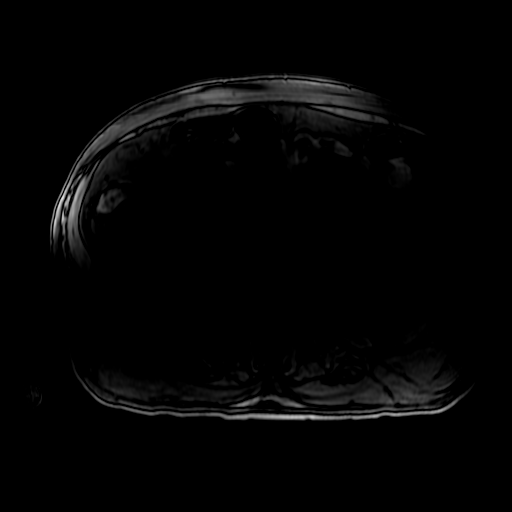
[im 112/112]
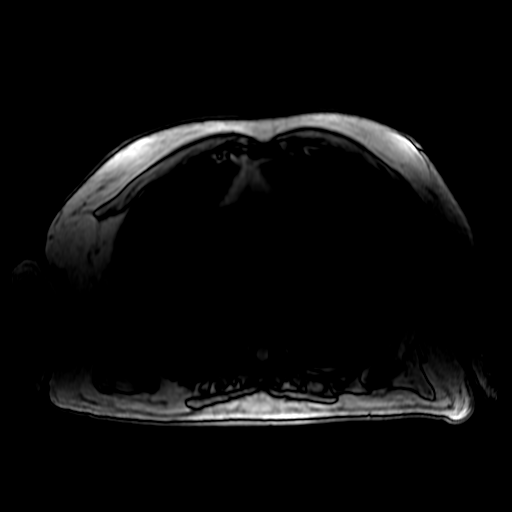

[Series 1200: T1 dynamic post-contrast · axial · non-contrast · 4.0mm · 0.98mm/px · z∈[-21,+257]mm · 3 of 140 slices shown (1 of 2)]
[im 1/140]
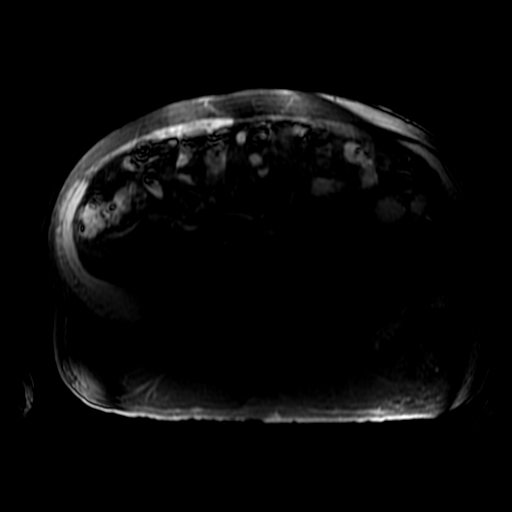
[im 70/140]
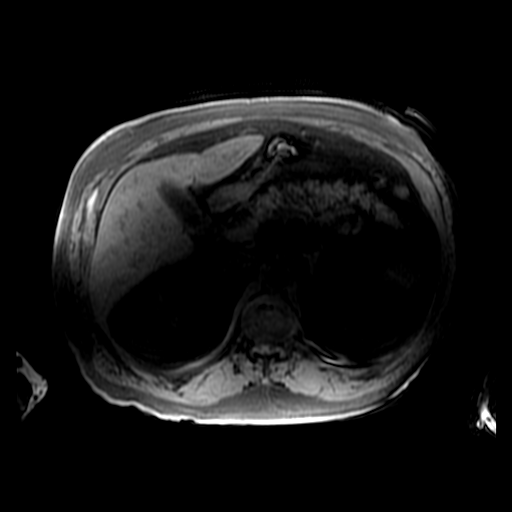
[im 140/140]
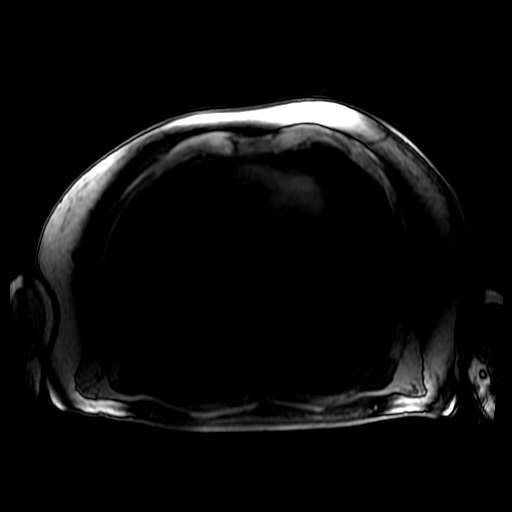

[Series 1201: T1 dynamic post-contrast · axial · non-contrast · 4.0mm · 0.98mm/px · 1 of 140 slices shown (2 of 2)]
[im 1/140]
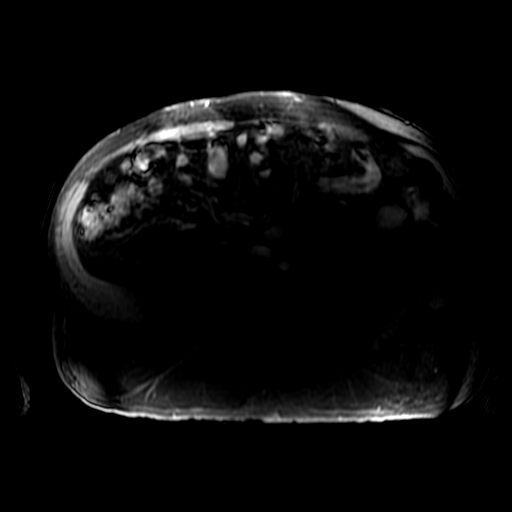

[22 of 48 positions shown; findings below may reference images not displayed]

FINDINGS: Lower chest: The lung bases are clear of an acute process. No
pleural effusions. No pericardial effusion.

Hepatobiliary: Small hepatic cysts are noted. No worrisome hepatic
lesions. No intrahepatic biliary dilatation. Normal caliber and
course of the common bile duct. No common bile duct stones are
identified but exam is limited by motion artifact.

The gallbladder is abnormal. There is gallbladder wall thickening,
gallbladder wall edema and some debris. No obvious or definite
gallstones.

Pancreas: MR findings consistent with acute pancreatitis. Diffuse
inflammation/edema involving the pancreas and peripancreatic
tissues. There is also moderate inflammation of the second and third
portions of the duodenum. Normal pancreatic enhancement. No findings
for pancreatic necrosis.

Spleen:  Normal size.  No focal lesions.

Adrenals/Urinary Tract: The adrenal glands and kidneys are
unremarkable. Small renal cysts are noted.

Stomach/Bowel: The stomach is unremarkable. Inflammation of the
duodenum is secondary to pancreatitis. The visualized small bowel
and colon are grossly normal.

Vascular/Lymphatic: The aorta and branch vessels are patent. The
major venous structures are patent. No retroperitoneal adenopathy.

Other:  No ascites or abdominal wall hernia.

Musculoskeletal: No significant bony findings.
IMPRESSION: 1. MR findings consistent with acute pancreatitis. No findings for
pancreatic necrosis.
2. Normal caliber and course of the common bile duct. No common bile
duct stones are identified but exam is limited by motion artifact.
3. Abnormal gallbladder with gallbladder wall thickening, edema and
enhancement and some debris. No obvious or definite gallstones.
4. Simple appearing hepatic and renal cysts.

## 2020-07-31 MED ORDER — ONDANSETRON HCL 4 MG PO TABS: 4.0000 mg | ORAL_TABLET | Freq: Four times a day (QID) | ORAL | Status: DC | PRN

## 2020-07-31 MED ORDER — FAMOTIDINE IN NACL 20-0.9 MG/50ML-% IV SOLN
20.0000 mg | Freq: Once | INTRAVENOUS | Status: AC
  Administered 2020-07-31: 20 mg via INTRAVENOUS
  Filled 2020-07-31: qty 50

## 2020-07-31 MED ORDER — ACETAMINOPHEN 325 MG PO TABS: 650.0000 mg | ORAL_TABLET | Freq: Four times a day (QID) | ORAL | Status: DC | PRN

## 2020-07-31 MED ORDER — HEPARIN SODIUM (PORCINE) 5000 UNIT/ML IJ SOLN
5000.0000 [IU] | Freq: Two times a day (BID) | INTRAMUSCULAR | Status: DC
  Administered 2020-07-31 – 2020-08-02 (×5): 5000 [IU] via SUBCUTANEOUS
  Filled 2020-07-31 (×5): qty 1

## 2020-07-31 MED ORDER — SODIUM CHLORIDE 0.9 % IV SOLN: INTRAVENOUS | Status: AC

## 2020-07-31 MED ORDER — IOHEXOL 300 MG/ML  SOLN
80.0000 mL | Freq: Once | INTRAMUSCULAR | Status: AC | PRN
  Administered 2020-07-31: 80 mL via INTRAVENOUS

## 2020-07-31 MED ORDER — ALUM & MAG HYDROXIDE-SIMETH 200-200-20 MG/5ML PO SUSP
30.0000 mL | Freq: Once | ORAL | Status: AC
  Administered 2020-07-31: 30 mL via ORAL
  Filled 2020-07-31: qty 30

## 2020-07-31 MED ORDER — ACETAMINOPHEN 650 MG RE SUPP: 650.0000 mg | Freq: Four times a day (QID) | RECTAL | Status: DC | PRN

## 2020-07-31 MED ORDER — GADOBUTROL 1 MMOL/ML IV SOLN
10.0000 mL | Freq: Once | INTRAVENOUS | Status: AC | PRN
  Administered 2020-07-31: 10 mL via INTRAVENOUS

## 2020-07-31 MED ORDER — PANTOPRAZOLE SODIUM 40 MG PO TBEC
40.0000 mg | DELAYED_RELEASE_TABLET | Freq: Every day | ORAL | Status: DC
  Administered 2020-07-31 – 2020-08-04 (×5): 40 mg via ORAL
  Filled 2020-07-31 (×5): qty 1

## 2020-07-31 MED ORDER — DICYCLOMINE HCL 20 MG PO TABS
20.0000 mg | ORAL_TABLET | Freq: Three times a day (TID) | ORAL | Status: DC | PRN
  Filled 2020-07-31: qty 1

## 2020-07-31 MED ORDER — HYDROMORPHONE HCL 1 MG/ML IJ SOLN
0.5000 mg | INTRAMUSCULAR | Status: DC | PRN
  Administered 2020-07-31: 1 mg via INTRAVENOUS
  Filled 2020-07-31: qty 1

## 2020-07-31 MED ORDER — SODIUM CHLORIDE 0.9 % IV SOLN: INTRAVENOUS | Status: DC

## 2020-07-31 MED ORDER — SODIUM CHLORIDE 0.9 % IV BOLUS
1000.0000 mL | Freq: Once | INTRAVENOUS | Status: AC
  Administered 2020-07-31: 1000 mL via INTRAVENOUS

## 2020-07-31 MED ORDER — METOPROLOL TARTRATE 25 MG PO TABS
25.0000 mg | ORAL_TABLET | Freq: Two times a day (BID) | ORAL | Status: DC
  Administered 2020-07-31 – 2020-08-04 (×9): 25 mg via ORAL
  Filled 2020-07-31 (×9): qty 1

## 2020-07-31 MED ORDER — SODIUM CHLORIDE 0.9% FLUSH
3.0000 mL | Freq: Once | INTRAVENOUS | Status: AC
  Administered 2020-08-04: 3 mL via INTRAVENOUS

## 2020-07-31 MED ORDER — TAMSULOSIN HCL 0.4 MG PO CAPS
0.8000 mg | ORAL_CAPSULE | Freq: Every day | ORAL | Status: DC
  Administered 2020-07-31 – 2020-08-03 (×4): 0.8 mg via ORAL
  Filled 2020-07-31 (×4): qty 2

## 2020-07-31 MED ORDER — ONDANSETRON HCL 4 MG/2ML IJ SOLN: 4.0000 mg | Freq: Four times a day (QID) | INTRAMUSCULAR | Status: DC | PRN

## 2020-07-31 MED ORDER — CO-Q 10 OMEGA-3 FISH OIL PO CAPS: 1.0000 | ORAL_CAPSULE | Freq: Every morning | ORAL | Status: DC

## 2020-07-31 MED ORDER — IPRATROPIUM BROMIDE 0.02 % IN SOLN
0.5000 mg | Freq: Four times a day (QID) | RESPIRATORY_TRACT | Status: DC | PRN
  Administered 2020-08-02: 0.5 mg via RESPIRATORY_TRACT
  Filled 2020-07-31: qty 2.5

## 2020-07-31 MED ORDER — PIPERACILLIN-TAZOBACTAM 3.375 G IVPB
3.3750 g | Freq: Three times a day (TID) | INTRAVENOUS | Status: DC
  Administered 2020-07-31 – 2020-08-03 (×9): 3.375 g via INTRAVENOUS
  Filled 2020-07-31 (×9): qty 50

## 2020-07-31 MED ORDER — OXYCODONE HCL 5 MG PO TABS: 5.0000 mg | ORAL_TABLET | ORAL | Status: DC | PRN

## 2020-07-31 MED ORDER — VITAMIN D 25 MCG (1000 UNIT) PO TABS
1000.0000 [IU] | ORAL_TABLET | Freq: Every day | ORAL | Status: DC
  Administered 2020-08-01 – 2020-08-04 (×4): 1000 [IU] via ORAL
  Filled 2020-07-31 (×4): qty 1

## 2020-07-31 NOTE — ED Notes (Signed)
Pt transported to MRI 

## 2020-07-31 NOTE — Progress Notes (Signed)
Pharmacy Antibiotic Note  Vincent Collins is a 79 y.o. male admitted on 07/31/2020 with pancreatitis.  Pharmacy has been consulted for zosyn dosing.  Plan: Zosyn 3.375g IV every 8 hours (extended infusion) Monitor renal function, clinical progression and LOT  Height: 5\' 8"  (172.7 cm) Weight: 122 kg (268 lb 15.4 oz) IBW/kg (Calculated) : 68.4  Temp (24hrs), Avg:98.1 F (36.7 C), Min:98 F (36.7 C), Max:98.2 F (36.8 C)  Recent Labs  Lab 07/31/20 0333  WBC 12.3*  CREATININE 1.71*    Estimated Creatinine Clearance: 45.2 mL/min (A) (by C-G formula based on SCr of 1.71 mg/dL (H)).    No Known Allergies  09/30/20, PharmD Clinical Pharmacist ED Pharmacist Phone # [phone] 07/31/2020 2:26 PM

## 2020-07-31 NOTE — ED Triage Notes (Signed)
Patient reports right chest pain radiating to right lateral ribcage onset this evening with SOB and occasional dry cough , no emesis or diaphoresis , no fever or chills.

## 2020-07-31 NOTE — H&P (Signed)
History and Physical    Vincent EaringJames D Goldner ZOX:[id number] DOB: Feb 04, 1941 DOA: 07/31/2020  PCP: Dettinger, Elige RadonJoshua A, MD (Confirm with patient/family/NH records and if not entered, [facility]) Patient coming from: Home  I [facility] personally briefly [facility]  Chief Complaint: RUQ pain  HPI: Vincent EaringJames D Collins is a 79 y.o. male with medical history significant of paroxysmal A. fib on Eliquis, COPD, DM 2, HTN, HLD, ureteral stones, diverticulitis, pancreatitis with unknown etiology in March 2021, presented with recurrent right RUQ abdominal pain.  Symptoms started at night, cramping like 8-9/10, with fever of 101 at home and episode of chills, pain radiates to right shoulder associated with short of breath.  Feeling nauseous but no vomiting. ED Course: CT implying passed gallstone and gallstone pancreatitis.  Is a 12.3, AST 218, ALT 516, total bili 8.2, direct bili 5.4, indirect bili 2.8, lipase 1725  Review of Systems: As per HPI otherwise 10 point review of systems negative.    Past Medical History:  Diagnosis Date  . Arrhythmia   . Broken leg and ribs, right, closed, initial encounter   . COPD (chronic obstructive pulmonary disease) (HCC)   . Diabetes mellitus without complication (HCC)   . Diverticulitis   . GERD (gastroesophageal reflux disease)   . Hydronephrosis of left kidney   . Hyperlipidemia   . Hypertension   . Left ureteral stone    obstructing  . Renal calculus or stone 2015    Past Surgical History:  Procedure Laterality Date  . 2d echo  04/04/09  . CYSTOSCOPY WITH RETROGRADE PYELOGRAM, URETEROSCOPY AND STENT PLACEMENT Left 10/11/2014   Procedure: CYSTOSCOPY WITH RETROGRADE PYELOGRAM, DIAGNOSTIC URETEROSCOPY AND STENT PLACEMENT;  Surgeon: Sebastian Acheheodore Manny, MD;  [facility];  Service: Urology;  Laterality: Left;  . LUMBAR DISC SURGERY    . SPINE SURGERY       reports that he  quit smoking about 53 years ago. His smoking use included cigarettes. He has quit using smokeless tobacco.  His smokeless tobacco use included chew. He reports that he does not drink alcohol and does not use drugs.  No Known Allergies  Family History  Problem Relation Age of Onset  . Early death Father        MI age 78  . Heart attack Father   . COPD Brother   . Cancer Sister      Prior to Admission medications   Medication Sig Start Date End Date Taking? Authorizing Provider  albuterol (PROVENTIL) (2.5 MG/3ML) 0.083% nebulizer solution USE 1 VIAL VIA NEBULIZER 4  TIMES DAILY AS NEEDED FOR  WHEEZING OR SHORTNESS OF  BREATH Patient taking differently: Take 2.5 mg by nebulization 4 (four) times daily as needed for wheezing or shortness of breath.  06/18/20  Yes Dettinger, Elige RadonJoshua A, MD  apixaban (ELIQUIS) 5 MG TABS tablet Take 1 tablet (5 mg total) by mouth 2 (two) times daily. 05/02/20  Yes O'Neal, Ronnald RampWesley Thomas, MD  atorvastatin (LIPITOR) 40 MG tablet TAKE 1 TABLET BY MOUTH  DAILY Patient taking differently: Take 40 mg by mouth daily.  06/18/20  Yes Dettinger, Elige RadonJoshua A, MD  Cholecalciferol (VITAMIN D) 1000 UNITS capsule Take 1,000 Units by mouth in the morning.    Yes [provider]  Coenzyme Q10-Fish Oil-Vit E (CO-Q 10 OMEGA-3 FISH OIL) CAPS Take 1 capsule by mouth in the morning. 100mg  Capsule Qunol   Yes [provider]  dicyclomine (BENTYL) 20 MG tablet Take 20 mg by mouth 3 (three) times daily as needed for spasms.  07/30/20  Yes [provider]  lisinopril (ZESTRIL) 20 MG tablet TAKE 1 TABLET BY MOUTH  DAILY Patient taking differently: Take 20 mg by mouth daily.  06/18/20  Yes Dettinger, Elige Radon, MD  metFORMIN (GLUCOPHAGE-XR) 500 MG 24 hr tablet TAKE 1 TABLET BY MOUTH  TWICE DAILY Patient taking differently: Take 500 mg by mouth in the morning and at bedtime.  06/18/20  Yes Dettinger, Elige Radon, MD  metoprolol succinate (TOPROL-XL) 50 MG 24 hr tablet TAKE 1 TABLET  BY MOUTH  DAILY WITH OR IMMEDIATELY  FOLLOWING A MEAL Patient taking differently: Take 50 mg by mouth daily.  06/18/20  Yes Dettinger, Elige Radon, MD  omeprazole (PRILOSEC) 20 MG capsule TAKE 1 CAPSULE BY MOUTH  DAILY Patient taking differently: Take 20 mg by mouth daily.  06/18/20  Yes Hawks, Christy A, FNP  ondansetron (ZOFRAN-ODT) 4 MG disintegrating tablet Take 4 mg by mouth every 8 (eight) hours as needed for nausea.  07/30/20  Yes [provider]  tamsulosin (FLOMAX) 0.4 MG CAPS capsule TAKE 2 CAPSULES BY MOUTH  DAILY AFTER SUPPER Patient taking differently: Take 0.8 mg by mouth daily after supper.  06/18/20  Yes Hawks, Christy A, FNP  fluticasone (FLONASE) 50 MCG/ACT nasal spray USE 1 SPRAY INTO BOTH  NOSTRILS 2 TIMES DAILY AS  NEEDED FOR ALLERGIES OR  RHINITIS Patient not taking: Reported on 07/31/2020 06/18/20   Dettinger, Elige Radon, MD    Physical Exam: Vitals:   07/31/20 1415 07/31/20 1437 07/31/20 1545 07/31/20 1621  BP:  (!) 104/54 133/63 (!) 147/67  Pulse:  65 65 66  Resp: 20 20 15 19   Temp:      SpO2:  96% 96% 99%  Weight:      Height:        Constitutional: NAD, calm, comfortable Vitals:   07/31/20 1415 07/31/20 1437 07/31/20 1545 07/31/20 1621  BP:  (!) 104/54 133/63 (!) 147/67  Pulse:  65 65 66  Resp: 20 20 15 19   Temp:      SpO2:  96% 96% 99%  Weight:      Height:       Eyes: PERRL, positive for jaundice ENMT: Mucous membranes are dry. Posterior pharynx clear of any exudate or lesions.Normal dentition.  Neck: normal, supple, no masses, no thyromegaly Respiratory: clear to auscultation bilaterally, no wheezing, no crackles. Normal respiratory effort. No accessory muscle use.  Cardiovascular: Regular rate and rhythm, no murmurs / rubs / gallops. No extremity edema. 2+ pedal pulses. No carotid bruits.  Abdomen: RUQ tenderness, no rebound no guarding, no masses palpated. No hepatosplenomegaly. Bowel sounds positive.  Musculoskeletal: no clubbing / cyanosis. No  joint deformity upper and lower extremities. Good ROM, no contractures. Normal muscle tone.  Skin: no rashes, lesions, ulcers. No induration Neurologic: CN 2-12 grossly intact. Sensation intact, DTR normal. Strength 5/5 in all 4.  Psychiatric: Normal judgment and insight. Alert and oriented x 3. Normal mood.     Labs on Admission: I have personally reviewed following labs and imaging studies  CBC: Recent Labs  Lab 07/31/20 0333  WBC 12.3*  HGB 15.7  HCT 46.6  MCV 90.0  PLT 135*   Basic Metabolic Panel: Recent Labs  Lab 07/31/20 0333  NA 133*  K 4.0  CL 98  CO2 24  GLUCOSE 186*  BUN 23  CREATININE 1.71*  CALCIUM 8.3*  GFR: Estimated Creatinine Clearance: 45.2 mL/min (A) (by C-G formula based on SCr of 1.71 mg/dL (H)). Liver Function Tests: Recent Labs  Lab 07/31/20 0838  AST 218*  ALT 516*  ALKPHOS 123  BILITOT 8.2*  PROT 5.6*  ALBUMIN 3.0*   Recent Labs  Lab 07/31/20 0838  LIPASE 1,725*   No results for input(s): AMMONIA in the last 168 hours. Coagulation Profile: Recent Labs  Lab 07/31/20 0333  INR 1.3*   Cardiac Enzymes: No results for input(s): CKTOTAL, CKMB, CKMBINDEX, TROPONINI in the last 168 hours. BNP (last 3 results) No results for input(s): PROBNP in the last 8760 hours. HbA1C: No results for input(s): HGBA1C in the last 72 hours. CBG: No results for input(s): GLUCAP in the last 168 hours. Lipid Profile: No results for input(s): CHOL, HDL, LDLCALC, TRIG, CHOLHDL, LDLDIRECT in the last 72 hours. Thyroid Function Tests: No results for input(s): TSH, T4TOTAL, FREET4, T3FREE, THYROIDAB in the last 72 hours. Anemia Panel: No results for input(s): VITAMINB12, FOLATE, FERRITIN, TIBC, IRON, RETICCTPCT in the last 72 hours. Urine analysis:    Component Value Date/Time   COLORURINE AMBER (A) 03/25/2020 0450   APPEARANCEUR CLEAR 03/25/2020 0450   LABSPEC 1.023 03/25/2020 0450   PHURINE 5.0 03/25/2020 0450   GLUCOSEU NEGATIVE 03/25/2020  0450   HGBUR NEGATIVE 03/25/2020 0450   BILIRUBINUR NEGATIVE 03/25/2020 0450   BILIRUBINUR small 08/07/2015 1659   KETONESUR NEGATIVE 03/25/2020 0450   PROTEINUR 30 (A) 03/25/2020 0450   UROBILINOGEN negative 08/07/2015 1659   UROBILINOGEN 0.2 09/20/2014 2300   NITRITE NEGATIVE 03/25/2020 0450   LEUKOCYTESUR NEGATIVE 03/25/2020 0450    Radiological Exams on Admission: DG Chest 2 View  Result Date: 07/31/2020 CLINICAL DATA:  Chest pain EXAM: CHEST - 2 VIEW COMPARISON:  03/28/2020 FINDINGS: Subsegmental atelectasis at the bases. No consolidation or pleural effusion. Borderline cardiomegaly with aortic atherosclerosis. No pneumothorax. IMPRESSION: Mildly low lung volumes with subsegmental atelectasis at the bases. Electronically Signed   By: Jasmine Pang M.D.   On: 07/31/2020 03:40   CT ABDOMEN PELVIS W CONTRAST  Result Date: 07/31/2020 CLINICAL DATA:  Epigastric pain, tenderness to palpation EXAM: CT ABDOMEN AND PELVIS WITH CONTRAST TECHNIQUE: Multidetector CT imaging of the abdomen and pelvis was performed using the standard protocol following bolus administration of intravenous contrast. CONTRAST:  33mL OMNIPAQUE IOHEXOL 300 MG/ML  SOLN COMPARISON:  07/29/2020 FINDINGS: Lower chest: No acute abnormality. Scarring of the included bilateral lung bases. Hepatobiliary: No solid liver abnormality is seen. No gallstones or biliary dilatation. Thickening of the gallbladder wall, new compared to prior examination. (Series 6, image 28). Pancreas: Mild inflammatory fat stranding about the inferior head of the pancreas adjacent to the duodenum as below. No pancreatic ductal dilatation or surrounding inflammatory changes. Spleen: Normal in size without significant abnormality. Adrenals/Urinary Tract: Adrenal glands are unremarkable. Kidneys are normal, without renal calculi, solid lesion, or hydronephrosis. Bladder is unremarkable. Stomach/Bowel: Stomach is within normal limits. There is new inflammatory fat  stranding about the descending and transverse portions of the duodenum (series 6, image 43). Incidental diverticulum of the transverse duodenum. Appendix appears normal. No evidence of bowel wall thickening, distention, or inflammatory changes. Colonic diverticulosis. Vascular/Lymphatic: Aortic atherosclerosis. No enlarged abdominal or pelvic lymph nodes. Reproductive: Mild prostatomegaly. Other: Fat containing umbilical hernia.  No abdominopelvic ascites. Musculoskeletal: No acute or significant osseous findings. IMPRESSION: 1. There is new inflammatory fat stranding about the descending and transverse portions of the duodenum. Findings are consistent with either nonspecific infectious or inflammatory  duodenitis or alternately groove pancreatitis. 2. Thickening of the gallbladder wall, new compared to prior examination, and of uncertain significance, possibly reactive to adjacent inflammation. 3. Overall constellation of findings could be related to a passed gallstone and gallstone pancreatitis. 4. Colonic diverticulosis without evidence for diverticulitis. 5. Mild prostatomegaly. 6. Aortic Atherosclerosis (ICD10-I70.0). Electronically Signed   By: Lauralyn Primes M.D.   On: 07/31/2020 11:57    EKG: Independently reviewed.  Normal sinus rhythm, no ST-T changes  Assessment/Plan Active Problems:   Pancreatitis, recurrent   Pancreatitis  (please populate well all problems here in Problem List. (For example, if patient is on BP meds at home and you resume or decide to hold them, it is a problem that needs to be her. Same for CAD, COPD, HLD and so on)  Acute recurrent pancreatitis, suspicion for gallstone/sludge associated -Given what imaging study indicating so far and significant elevation of LFTs indicating a biliary source as the etiology for pancreatitis.   -MRCP pending, GI and surgery on board.  Probably early cholecystitis +/-cholangitis -Given the patient had fever last night and elevated white  count, will start antibiotics  AKI -Most likely prerenal from poor oral intake and dehydration, start IV fluid recheck kidney function tomorrow  Transaminitis and bilirubinemia, acute -Likely from a acute pancreatitis again ongoing for obstructive/biliary backing up etiology -MRCP, GI on board  HLD -Hold statin till LFT level falls back  Paroxysmal A. Fib -Hold anticoagulation in case patient will need procedure, start heparin subcu for DVT prophylaxis  IDDM -Sliding scale for now.  DVT prophylaxis: Heparin subcu Code Status: Full code Family Communication: Wife at bedside Disposition Plan: Expect more than 2 midnight hospital stay to treat pancreatitis and possible GI/surgical procedure Consults called: GI and surgeon Admission status: Telemetry admission   Emeline General MD [facility] Pager [phone]  07/31/2020, 5:13 PM

## 2020-07-31 NOTE — Progress Notes (Signed)
Called by EDP who stated GI requested surgical evaluation of this patient  Chart reviewed. Pt with epigastric/chest pain.  WBC 12.3 BUN 23/Cr 1.71 AST 218, ALT 516, bilirubin 8.2, lipase 1,725   Patient with recurrent pancreatitis unknown etiology, elevated LFTs, and hyperbilirubinemia. No visible gallstones on imaging but could certainly have small stones or sludge. Given active pancreatitis and ongoing workup by GI w/ MRCP, this patient does not have any immediate, urgent surgical needs. If this patients pancreatitis improves with medical management and the leading differential is that it was caused by cholelithiasis, or if his pancreatitis progresses to necrotizing infection then please call general surgery back and we will be happy to formally consult.   Hosie Spangle, PA-C  [facility]

## 2020-07-31 NOTE — ED Provider Notes (Signed)
[facility] EMERGENCY DEPARTMENT Provider Note   CSN: [id number] Arrival date & time: 07/31/20  3300     History Chief Complaint  Patient presents with  . Chest Pain    Vincent Collins is a 79 y.o. male.  HPI    79 y/o male with a h/o right rib fx, COPD, DM, diverticulitis, GERD, HLD, HTN, left ureteral stone, who presents the emergency department today for evaluation of right-sided abdominal pain.  States he has had pain for the last 4 days that started intermittently.  Since last night pain has been worsening.  He had a BM this morning and pain improved somewhat.  He now rates it a 4/10.  He reports associated nausea vomiting and loose stools. No hematochezia or melena. Reports subjective fevers this AM. Denies dysuria, frequency. Denies chest pain,  Has chronic SOB and cough which are unchanged. Denies ETOH use or heavy NSAID use.   Reviewed prior records.  He was seen at an outside ED on 07/29/2020 where he had CT abdomen/pelvis completed which showed no acute findings.  Of note, his lipase was elevated at 159.  Additionally, patient was recently admitted 03/25/2020 and ultimately diagnosed with acute pancreatitis and sepsis secondary to E. coli bacteremia with possible acute cholangitis  Past Medical History:  Diagnosis Date  . Arrhythmia   . Broken leg and ribs, right, closed, initial encounter   . COPD (chronic obstructive pulmonary disease) (HCC)   . Diabetes mellitus without complication (HCC)   . Diverticulitis   . GERD (gastroesophageal reflux disease)   . Hydronephrosis of left kidney   . Hyperlipidemia   . Hypertension   . Left ureteral stone    obstructing  . Renal calculus or stone 2015    Patient Active Problem List   Diagnosis Date Noted  . Cholangitis 03/25/2020  . Abdominal pain, RUQ   . Sepsis (HCC)   . GERD (gastroesophageal reflux disease) 10/07/2017  . Essential hypertension 04/02/2011  . COPD (chronic obstructive pulmonary disease)  (HCC) 04/02/2011  . BPH (benign prostatic hyperplasia) 04/02/2011  . Hyperlipidemia associated with type 2 diabetes mellitus (HCC) 04/02/2011  . Vitamin D deficiency 04/02/2011  . Type 2 diabetes mellitus (HCC) 04/02/2011    Past Surgical History:  Procedure Laterality Date  . 2d echo  04/04/09  . CYSTOSCOPY WITH RETROGRADE PYELOGRAM, URETEROSCOPY AND STENT PLACEMENT Left 10/11/2014   Procedure: CYSTOSCOPY WITH RETROGRADE PYELOGRAM, DIAGNOSTIC URETEROSCOPY AND STENT PLACEMENT;  Surgeon: [name], MD;  Location: [facility];  Service: Urology;  Laterality: Left;  . LUMBAR DISC SURGERY    . SPINE SURGERY         Family History  Problem Relation Age of Onset  . Early death Father        MI age 41  . Heart attack Father   . COPD Brother   . Cancer Sister     Social History   Tobacco Use  . Smoking status: Former Smoker    Types: Cigarettes    Quit date: 06/18/1967    Years since quitting: 53.1  . Smokeless tobacco: Former Neurosurgeon    Types: Engineer, drilling  . Vaping Use: Never used  Substance Use Topics  . Alcohol use: No  . Drug use: No    Home Medications Prior to Admission medications   Medication Sig Start Date End Date Taking? Authorizing Provider  albuterol (PROVENTIL) (2.5 MG/3ML) 0.083% nebulizer solution USE 1 VIAL VIA NEBULIZER 4  TIMES DAILY  AS NEEDED FOR  WHEEZING OR SHORTNESS OF  BREATH Patient taking differently: Take 2.5 mg by nebulization 4 (four) times daily as needed for wheezing or shortness of breath.  06/18/20  Yes Dettinger, Elige Radon, MD  apixaban (ELIQUIS) 5 MG TABS tablet Take 1 tablet (5 mg total) by mouth 2 (two) times daily. 05/02/20  Yes O'Neal, Ronnald Ramp, MD  atorvastatin (LIPITOR) 40 MG tablet TAKE 1 TABLET BY MOUTH  DAILY Patient taking differently: Take 40 mg by mouth daily.  06/18/20  Yes Dettinger, Elige Radon, MD  Cholecalciferol (VITAMIN D) 1000 UNITS capsule Take 1,000 Units by mouth in the morning.    Yes [provider]  Coenzyme Q10-Fish Oil-Vit E (CO-Q 10 OMEGA-3 FISH OIL) CAPS Take 1 capsule by mouth in the morning. 100mg  Capsule Qunol   Yes [provider]  dicyclomine (BENTYL) 20 MG tablet Take 20 mg by mouth 3 (three) times daily as needed for spasms.  07/30/20  Yes [provider]  lisinopril (ZESTRIL) 20 MG tablet TAKE 1 TABLET BY MOUTH  DAILY Patient taking differently: Take 20 mg by mouth daily.  06/18/20  Yes Dettinger, 06/20/20, MD  metFORMIN (GLUCOPHAGE-XR) 500 MG 24 hr tablet TAKE 1 TABLET BY MOUTH  TWICE DAILY Patient taking differently: Take 500 mg by mouth in the morning and at bedtime.  06/18/20  Yes Dettinger, 06/20/20, MD  metoprolol succinate (TOPROL-XL) 50 MG 24 hr tablet TAKE 1 TABLET BY MOUTH  DAILY WITH OR IMMEDIATELY  FOLLOWING A MEAL Patient taking differently: Take 50 mg by mouth daily.  06/18/20  Yes Dettinger, 06/20/20, MD  omeprazole (PRILOSEC) 20 MG capsule TAKE 1 CAPSULE BY MOUTH  DAILY Patient taking differently: Take 20 mg by mouth daily.  06/18/20  Yes Hawks, Christy A, FNP  ondansetron (ZOFRAN-ODT) 4 MG disintegrating tablet Take 4 mg by mouth every 8 (eight) hours as needed for nausea.  07/30/20  Yes [provider]  tamsulosin (FLOMAX) 0.4 MG CAPS capsule TAKE 2 CAPSULES BY MOUTH  DAILY AFTER SUPPER Patient taking differently: Take 0.8 mg by mouth daily after supper.  06/18/20  Yes Hawks, Christy A, FNP  fluticasone (FLONASE) 50 MCG/ACT nasal spray USE 1 SPRAY INTO BOTH  NOSTRILS 2 TIMES DAILY AS  NEEDED FOR ALLERGIES OR  RHINITIS Patient not taking: Reported on 07/31/2020 06/18/20   Dettinger, 06/20/20, MD    Allergies    Patient has no known allergies.  Review of Systems   Review of Systems  Review of Systems  Constitutional: Positive for fever.  HENT: Negative for ear pain and sore throat.   Eyes: Negative for visual disturbance.  Respiratory: Negative for cough and shortness of breath.   Cardiovascular: Negative for chest pain.    Gastrointestinal: Positive for abdominal pain, diarrhea, nausea and vomiting. Negative for constipation.  Genitourinary: Negative for dysuria and hematuria.  Musculoskeletal: Negative for back pain.  Skin: Negative for rash.  Neurological: Negative for headaches.  All other systems reviewed and are negative.  Physical Exam Updated Vital Signs BP (!) 103/46   Pulse 64   Temp 98.2 F (36.8 C)   Resp 12   Ht 5\' 8"  (1.727 m)   Wt 122 kg   SpO2 94%   BMI 40.90 kg/m   Physical Exam   Vitals and nursing note reviewed.  Constitutional:      Appearance: He is well-developed.  HENT:     Head: Normocephalic and atraumatic.  Eyes:  Conjunctiva/sclera: Conjunctivae normal.  Cardiovascular:     Rate and Rhythm: Normal rate and regular rhythm.     Heart sounds: No murmur heard.   Pulmonary:     Effort: Pulmonary effort is normal. No respiratory distress.     Breath sounds: Normal breath sounds. No decreased breath sounds, wheezing, rhonchi or rales.  Abdominal:     General: Bowel sounds are normal.     Palpations: Abdomen is soft.     Tenderness: There is abdominal tenderness in the right upper quadrant and epigastric area. There is guarding. There is no right CVA tenderness, left CVA tenderness or rebound.  Musculoskeletal:     Cervical back: Neck supple.  Skin:    General: Skin is warm and dry.  Neurological:     Mental Status: He is alert.   ED Results / Procedures / Treatments   Labs (all labs ordered are listed, but only abnormal results are displayed) Labs Reviewed  BASIC METABOLIC PANEL - Abnormal; Notable for the following components:      Result Value   Sodium 133 (*)    Glucose, Bld 186 (*)    Creatinine, Ser 1.71 (*)    Calcium 8.3 (*)    GFR calc non Af Amer 38 (*)    GFR calc Af Amer 43 (*)    All other components within normal limits  CBC - Abnormal; Notable for the following components:   WBC 12.3 (*)    Platelets 135 (*)    All other components  within normal limits  PROTIME-INR - Abnormal; Notable for the following components:   Prothrombin Time 15.7 (*)    INR 1.3 (*)    All other components within normal limits  HEPATIC FUNCTION PANEL - Abnormal; Notable for the following components:   Total Protein 5.6 (*)    Albumin 3.0 (*)    AST 218 (*)    ALT 516 (*)    Total Bilirubin 8.2 (*)    Bilirubin, Direct 5.4 (*)    Indirect Bilirubin 2.8 (*)    All other components within normal limits  LIPASE, BLOOD - Abnormal; Notable for the following components:   Lipase 1,725 (*)    All other components within normal limits  SARS CORONAVIRUS 2 BY RT PCR (HOSPITAL ORDER, PERFORMED IN [facility])  TROPONIN I (HIGH SENSITIVITY)  TROPONIN I (HIGH SENSITIVITY)    EKG EKG Interpretation  Date/Time:  Tuesday July 31 2020 03:23:35 EDT Ventricular Rate:  77 PR Interval:  206 QRS Duration: 98 QT Interval:  390 QTC Calculation: 441 R Axis:   -31 Text Interpretation: Normal sinus rhythm Left axis deviation Minimal voltage criteria for LVH, may be normal variant ( R in aVL ) Abnormal ECG No STEMI Confirmed by Alvester Chou [phone]) on 07/31/2020 7:15:00 AM   Radiology DG Chest 2 View  Result Date: 07/31/2020 CLINICAL DATA:  Chest pain EXAM: CHEST - 2 VIEW COMPARISON:  03/28/2020 FINDINGS: Subsegmental atelectasis at the bases. No consolidation or pleural effusion. Borderline cardiomegaly with aortic atherosclerosis. No pneumothorax. IMPRESSION: Mildly low lung volumes with subsegmental atelectasis at the bases. Electronically Signed   By: Jasmine Pang M.D.   On: 07/31/2020 03:40   CT ABDOMEN PELVIS W CONTRAST  Result Date: 07/31/2020 CLINICAL DATA:  Epigastric pain, tenderness to palpation EXAM: CT ABDOMEN AND PELVIS WITH CONTRAST TECHNIQUE: Multidetector CT imaging of the abdomen and pelvis was performed using the standard protocol following bolus administration of intravenous contrast. CONTRAST:  61mL OMNIPAQUE  IOHEXOL 300  MG/ML  SOLN COMPARISON:  07/29/2020 FINDINGS: Lower chest: No acute abnormality. Scarring of the included bilateral lung bases. Hepatobiliary: No solid liver abnormality is seen. No gallstones or biliary dilatation. Thickening of the gallbladder wall, new compared to prior examination. (Series 6, image 28). Pancreas: Mild inflammatory fat stranding about the inferior head of the pancreas adjacent to the duodenum as below. No pancreatic ductal dilatation or surrounding inflammatory changes. Spleen: Normal in size without significant abnormality. Adrenals/Urinary Tract: Adrenal glands are unremarkable. Kidneys are normal, without renal calculi, solid lesion, or hydronephrosis. Bladder is unremarkable. Stomach/Bowel: Stomach is within normal limits. There is new inflammatory fat stranding about the descending and transverse portions of the duodenum (series 6, image 43). Incidental diverticulum of the transverse duodenum. Appendix appears normal. No evidence of bowel wall thickening, distention, or inflammatory changes. Colonic diverticulosis. Vascular/Lymphatic: Aortic atherosclerosis. No enlarged abdominal or pelvic lymph nodes. Reproductive: Mild prostatomegaly. Other: Fat containing umbilical hernia.  No abdominopelvic ascites. Musculoskeletal: No acute or significant osseous findings. IMPRESSION: 1. There is new inflammatory fat stranding about the descending and transverse portions of the duodenum. Findings are consistent with either nonspecific infectious or inflammatory duodenitis or alternately groove pancreatitis. 2. Thickening of the gallbladder wall, new compared to prior examination, and of uncertain significance, possibly reactive to adjacent inflammation. 3. Overall constellation of findings could be related to a passed gallstone and gallstone pancreatitis. 4. Colonic diverticulosis without evidence for diverticulitis. 5. Mild prostatomegaly. 6. Aortic Atherosclerosis (ICD10-I70.0). Electronically Signed    By: Lauralyn PrimesAlex  Bibbey M.D.   On: 07/31/2020 11:57    Procedures Procedures (including critical care time)  Medications Ordered in ED Medications  sodium chloride flush (NS) 0.9 % injection 3 mL (3 mLs Intravenous Not Given 07/31/20 1209)  0.9 %  sodium chloride infusion (has no administration in time range)  alum & mag hydroxide-simeth (MAALOX/MYLANTA) 200-200-20 MG/5ML suspension 30 mL (30 mLs Oral Given 07/31/20 0848)  famotidine (PEPCID) IVPB 20 mg premix (0 mg Intravenous Stopped 07/31/20 0958)  sodium chloride 0.9 % bolus 1,000 mL (0 mLs Intravenous Stopped 07/31/20 1207)  iohexol (OMNIPAQUE) 300 MG/ML solution 80 mL (80 mLs Intravenous Contrast Given 07/31/20 1133)    ED Course  I have reviewed the triage vital signs and the nursing notes.  Pertinent labs & imaging results that were available during my care of the patient were reviewed by me and considered in my medical decision making (see chart for details).    MDM Rules/Calculators/A&P                          79 year old male presenting for evaluation of right-sided abdominal pain and epigastric pain.  Associated with some nausea and vomiting as well.  Denies chest pain or shortness of breath.  Reviewed/interpreted labs CBC showed mild leukocytosis and thrombocytopenia BMP with mild hyponatremia, elevated creatinine at 1.7 Liver enzymes with significantly elevated AST/ALT at 218 and 5/16.  Total bilirubin elevated at 8.2. Lipase significantly elevated at 1700 Delta troponins negative x2             -Patient does not have any chest pain therefore I feel this is a low likelihood for ACS  EKG Normal sinus rhythm Left axis deviation Minimal voltage criteria for LVH, may be normal variant ( R in aVL ) Abnormal ECG No STEMI  CXR reviewed/interepted - with subsegmental atelectasis at the bases without acute infiltrate, pna, ptx or other emergent abonramlity  CT abd/pelvis  1. There is new inflammatory fat stranding about the  descending and transverse portions of the duodenum. Findings are consistent with either nonspecific infectious or inflammatory duodenitis or alternately groove pancreatitis. 2. Thickening of the gallbladder wall, new compared to prior examination, and of uncertain significance, possibly reactive to adjacent inflammation. 3. Overall constellation of findings could be related to a passed gallstone and gallstone pancreatitis. 4. Colonic diverticulosis without evidence for diverticulitis. 5. Mild prostatomegaly. 6. Aortic Atherosclerosis  12:23 PM CONSULT with Dr Bosie Clos with gastroenterology. Recommends obtaining MRCP. Recommends consulting surgery. GI will consult on patient.   12:37 PM CONSULT with Donn Pierini, PA-C with general surgery who will consult on the patient.   Pt was given IVF, gi cocktail and pepcid. States he feels some improvement after this. Discussed plan for admission for further tx and he is in agreement to stay.   1:45 PM CONSULT with Dr. Chipper Herb who accepts patient for admission.    Final Clinical Impression(s) / ED Diagnoses Final diagnoses:  Gallstone pancreatitis    Rx / DC Orders ED Discharge Orders    None       Karrie Meres, PA-C 07/31/20 1345    Tegeler, Canary Brim, MD 07/31/20 [phone]

## 2020-07-31 NOTE — Progress Notes (Signed)
PHARMACIST - PHYSICIAN ORDER COMMUNICATION  CONCERNING: P&T Medication Policy on Herbal Medications  DESCRIPTION:  This patient's order for:  CO-Q 10 OMEGA-3 FISH OIL PO CAPS  has been noted.  This product(s) is classified as an "herbal" or natural product. Due to a lack of definitive safety studies or FDA approval, nonstandard manufacturing practices, plus the potential risk of unknown drug-drug interactions while on inpatient medications, the Pharmacy and Therapeutics Committee does not permit the use of "herbal" or natural products of this type within [facility].   ACTION TAKEN: The pharmacy department is unable to verify this order at this time and your patient has been informed of this safety policy. Please reevaluate patient's clinical condition at discharge and address if the herbal or natural product(s) should be resumed at that time.  Christoper Fabian, PharmD, BCPS Please see amion for complete clinical pharmacist phone list 07/31/2020 2:35 PM

## 2020-07-31 NOTE — Consult Note (Signed)
Referring Provider: Leonia Coronaortni Couture, PA-C Primary Care Physician:  Dettinger, Elige RadonJoshua A, MD Primary Gastroenterologist:  Gentry FitzUnassigned  Reason for Consultation:  Pancreatitis  HPI: Vincent Collins is a 79 y.o. male with history of gallstone pancreatitis and A fib (on Eliquis) presenting for consultation of pancreatitis.  Patient was recently admitted with presumed gallstone pancreatitis from 3/28 to 03/30/2020.  He was discharged in stable condition and had surgical follow-up but has not had his gallbladder removed.  Patient states was feeling well overall until this past Saturday 7/31 when he started experiencing diffuse, severe abdominal pain.  He reported the pain wrapped around his right ribs and into his back.  He states he ate dinner which included some fried vegetables, and later that evening, he had severe pain.  He also had nausea and several episodes of emesis.  The pain has persisted so he presented to the ED.  He has not had any episodes of emesis today.  Denies any hematemesis.  Patient had a soft bowel movement today.  Denies any melena or hematochezia.  Denies GERD, dysphagia, changes in appetite, or unexplained weight loss.  Denies any alcohol use.  He is on Eliquis for A. fib, last dose yesterday 8/2.  Denies any aspirin or NSAID use.  Denies any family history of pancreatitis.  Brother had a cholecystectomy.    Denies any family history of colon cancer or gastrointestinal malignancies.  States he had a colonoscopy previously which was normal, though there are no records available for this.   Past Medical History:  Diagnosis Date   Arrhythmia    Broken leg and ribs, right, closed, initial encounter    COPD (chronic obstructive pulmonary disease) (HCC)    Diabetes mellitus without complication (HCC)    Diverticulitis    GERD (gastroesophageal reflux disease)    Hydronephrosis of left kidney    Hyperlipidemia    Hypertension    Left ureteral stone    obstructing    Renal calculus or stone 2015    Past Surgical History:  Procedure Laterality Date   2d echo  04/04/09   CYSTOSCOPY WITH RETROGRADE PYELOGRAM, URETEROSCOPY AND STENT PLACEMENT Left 10/11/2014   Procedure: CYSTOSCOPY WITH RETROGRADE PYELOGRAM, DIAGNOSTIC URETEROSCOPY AND STENT PLACEMENT;  Surgeon: Sebastian Acheheodore Manny, MD;  [facility];  Service: Urology;  Laterality: Left;   LUMBAR DISC SURGERY     SPINE SURGERY      Prior to Admission medications   Medication Sig Start Date End Date Taking? Authorizing Provider  albuterol (PROVENTIL) (2.5 MG/3ML) 0.083% nebulizer solution USE 1 VIAL VIA NEBULIZER 4  TIMES DAILY AS NEEDED FOR  WHEEZING OR SHORTNESS OF  BREATH Patient taking differently: Take 2.5 mg by nebulization 4 (four) times daily as needed for wheezing or shortness of breath.  06/18/20  Yes Dettinger, Elige RadonJoshua A, MD  apixaban (ELIQUIS) 5 MG TABS tablet Take 1 tablet (5 mg total) by mouth 2 (two) times daily. 05/02/20  Yes O'Neal, Ronnald RampWesley Thomas, MD  atorvastatin (LIPITOR) 40 MG tablet TAKE 1 TABLET BY MOUTH  DAILY Patient taking differently: Take 40 mg by mouth daily.  06/18/20  Yes Dettinger, Elige RadonJoshua A, MD  Cholecalciferol (VITAMIN D) 1000 UNITS capsule Take 1,000 Units by mouth in the morning.    Yes [provider]  Coenzyme Q10-Fish Oil-Vit E (CO-Q 10 OMEGA-3 FISH OIL) CAPS Take 1 capsule by mouth in the morning. 100mg  Capsule Qunol   Yes [provider]  dicyclomine (BENTYL) 20 MG tablet Take 20 mg  by mouth 3 (three) times daily as needed for spasms.  07/30/20  Yes [provider]  lisinopril (ZESTRIL) 20 MG tablet TAKE 1 TABLET BY MOUTH  DAILY Patient taking differently: Take 20 mg by mouth daily.  06/18/20  Yes Dettinger, Elige Radon, MD  metFORMIN (GLUCOPHAGE-XR) 500 MG 24 hr tablet TAKE 1 TABLET BY MOUTH  TWICE DAILY Patient taking differently: Take 500 mg by mouth in the morning and at bedtime.  06/18/20  Yes Dettinger, Elige Radon, MD   metoprolol succinate (TOPROL-XL) 50 MG 24 hr tablet TAKE 1 TABLET BY MOUTH  DAILY WITH OR IMMEDIATELY  FOLLOWING A MEAL Patient taking differently: Take 50 mg by mouth daily.  06/18/20  Yes Dettinger, Elige Radon, MD  omeprazole (PRILOSEC) 20 MG capsule TAKE 1 CAPSULE BY MOUTH  DAILY Patient taking differently: Take 20 mg by mouth daily.  06/18/20  Yes Hawks, Christy A, FNP  ondansetron (ZOFRAN-ODT) 4 MG disintegrating tablet Take 4 mg by mouth every 8 (eight) hours as needed for nausea.  07/30/20  Yes [provider]  tamsulosin (FLOMAX) 0.4 MG CAPS capsule TAKE 2 CAPSULES BY MOUTH  DAILY AFTER SUPPER Patient taking differently: Take 0.8 mg by mouth daily after supper.  06/18/20  Yes Hawks, Christy A, FNP  fluticasone (FLONASE) 50 MCG/ACT nasal spray USE 1 SPRAY INTO BOTH  NOSTRILS 2 TIMES DAILY AS  NEEDED FOR ALLERGIES OR  RHINITIS Patient not taking: Reported on 07/31/2020 06/18/20   Dettinger, Elige Radon, MD    Scheduled Meds:  sodium chloride flush  3 mL Intravenous Once   Continuous Infusions: PRN Meds:.  Allergies as of 07/31/2020   (No Known Allergies)    Family History  Problem Relation Age of Onset   Early death Father        MI age 70   Heart attack Father    COPD Brother    Cancer Sister     Social History   Socioeconomic History   Marital status: Married    Spouse name: Not on file   Number of children: 2   Years of education: 8th   Highest education level: 8th grade  Occupational History   Not on file  Tobacco Use   Smoking status: Former Smoker    Types: Cigarettes    Quit date: 06/18/1967    Years since quitting: 53.1   Smokeless tobacco: Former Neurosurgeon    Types: Associate Professor Use: Never used  Substance and Sexual Activity   Alcohol use: No   Drug use: No   Sexual activity: Not Currently  Other Topics Concern   Not on file  Social History Narrative   Not on file   Social Determinants of Health   Financial Resource  Strain: Low Risk    Difficulty of Paying Living Expenses: Not hard at all  Food Insecurity: No Food Insecurity   Worried About Programme researcher, broadcasting/film/video in the Last Year: Never true   Ran Out of Food in the Last Year: Never true  Transportation Needs: No Transportation Needs   Lack of Transportation (Medical): No   Lack of Transportation (Non-Medical): No  Physical Activity: Inactive   Days of Exercise per Week: 0 days   Minutes of Exercise per Session: 0 min  Stress: No Stress Concern Present   Feeling of Stress : Not at all  Social Connections: Moderately Integrated   Frequency of Communication with Friends and Family: More than three times a week  Frequency of Social Gatherings with Friends and Family: Twice a week   Attends Religious Services: More than 4 times per year   Active Member of [employer] or Organizations: No   Attends Banker Meetings: Never   Marital Status: Married  Catering manager Violence:    Fear of Current or Ex-Partner:    Emotionally Abused:    Physically Abused:    Sexually Abused:     Review of Systems: Review of Systems  Constitutional: Negative for chills, fever and weight loss.  HENT: Negative for hearing loss and tinnitus.   Eyes: Negative for pain and redness.  Respiratory: Negative for cough and shortness of breath.   Cardiovascular: Negative for chest pain and palpitations.  Gastrointestinal: Positive for abdominal pain, nausea and vomiting. Negative for blood in stool, constipation, diarrhea, heartburn and melena.  Genitourinary: Negative for flank pain and hematuria.  Musculoskeletal: Positive for back pain. Negative for falls.  Skin: Negative for itching and rash.  Neurological: Negative for seizures and loss of consciousness.  Endo/Heme/Allergies: Negative for polydipsia. Bruises/bleeds easily.  Psychiatric/Behavioral: Negative for substance abuse. The patient is not nervous/anxious.      Physical Exam: Vital  signs: Vitals:   07/31/20 0710 07/31/20 1207  BP: (!) 102/57 125/72  Pulse: 83 67  Resp: 20 20  Temp:    SpO2: 100% 98%     Physical Exam Constitutional:      General: He is not in acute distress.    Appearance: He is well-developed. He is obese.  HENT:     Head: Normocephalic and atraumatic.     Mouth/Throat:     Mouth: Mucous membranes are moist.     Pharynx: Oropharynx is clear.  Eyes:     General: Scleral icterus present.     Extraocular Movements: Extraocular movements intact.  Cardiovascular:     Rate and Rhythm: Normal rate and regular rhythm.     Pulses: Normal pulses.     Heart sounds: Normal heart sounds.  Pulmonary:     Effort: Pulmonary effort is normal. No respiratory distress.     Breath sounds: Normal breath sounds.  Abdominal:     General: Bowel sounds are normal. There is distension.     Palpations: Abdomen is soft. There is no mass.     Tenderness: There is abdominal tenderness. There is guarding. There is no rebound.     Hernia: No hernia is present.  Musculoskeletal:        General: No swelling or tenderness.     Cervical back: Normal range of motion and neck supple.  Skin:    General: Skin is warm and dry.  Neurological:     General: No focal deficit present.     Mental Status: He is oriented to person, place, and time. He is lethargic.    GI:  Lab Results: Recent Labs    07/31/20 0333  WBC 12.3*  HGB 15.7  HCT 46.6  PLT 135*   BMET Recent Labs    07/31/20 0333  NA 133*  K 4.0  CL 98  CO2 24  GLUCOSE 186*  BUN 23  CREATININE 1.71*  CALCIUM 8.3*   LFT Recent Labs    07/31/20 0838  PROT 5.6*  ALBUMIN 3.0*  AST 218*  ALT 516*  ALKPHOS 123  BILITOT 8.2*  BILIDIR 5.4*  IBILI 2.8*   PT/INR Recent Labs    07/31/20 0333  LABPROT 15.7*  INR 1.3*     Studies/Results: DG Chest 2  View  Result Date: 07/31/2020 CLINICAL DATA:  Chest pain EXAM: CHEST - 2 VIEW COMPARISON:  03/28/2020 FINDINGS: Subsegmental atelectasis at  the bases. No consolidation or pleural effusion. Borderline cardiomegaly with aortic atherosclerosis. No pneumothorax. IMPRESSION: Mildly low lung volumes with subsegmental atelectasis at the bases. Electronically Signed   By: Jasmine Pang M.D.   On: 07/31/2020 03:40   CT ABDOMEN PELVIS W CONTRAST  Result Date: 07/31/2020 CLINICAL DATA:  Epigastric pain, tenderness to palpation EXAM: CT ABDOMEN AND PELVIS WITH CONTRAST TECHNIQUE: Multidetector CT imaging of the abdomen and pelvis was performed using the standard protocol following bolus administration of intravenous contrast. CONTRAST:  44mL OMNIPAQUE IOHEXOL 300 MG/ML  SOLN COMPARISON:  07/29/2020 FINDINGS: Lower chest: No acute abnormality. Scarring of the included bilateral lung bases. Hepatobiliary: No solid liver abnormality is seen. No gallstones or biliary dilatation. Thickening of the gallbladder wall, new compared to prior examination. (Series 6, image 28). Pancreas: Mild inflammatory fat stranding about the inferior head of the pancreas adjacent to the duodenum as below. No pancreatic ductal dilatation or surrounding inflammatory changes. Spleen: Normal in size without significant abnormality. Adrenals/Urinary Tract: Adrenal glands are unremarkable. Kidneys are normal, without renal calculi, solid lesion, or hydronephrosis. Bladder is unremarkable. Stomach/Bowel: Stomach is within normal limits. There is new inflammatory fat stranding about the descending and transverse portions of the duodenum (series 6, image 43). Incidental diverticulum of the transverse duodenum. Appendix appears normal. No evidence of bowel wall thickening, distention, or inflammatory changes. Colonic diverticulosis. Vascular/Lymphatic: Aortic atherosclerosis. No enlarged abdominal or pelvic lymph nodes. Reproductive: Mild prostatomegaly. Other: Fat containing umbilical hernia.  No abdominopelvic ascites. Musculoskeletal: No acute or significant osseous findings. IMPRESSION: 1.  There is new inflammatory fat stranding about the descending and transverse portions of the duodenum. Findings are consistent with either nonspecific infectious or inflammatory duodenitis or alternately groove pancreatitis. 2. Thickening of the gallbladder wall, new compared to prior examination, and of uncertain significance, possibly reactive to adjacent inflammation. 3. Overall constellation of findings could be related to a passed gallstone and gallstone pancreatitis. 4. Colonic diverticulosis without evidence for diverticulitis. 5. Mild prostatomegaly. 6. Aortic Atherosclerosis (ICD10-I70.0). Electronically Signed   By: Lauralyn Primes M.D.   On: 07/31/2020 11:57    Impression: Pancreatitis, suspect biliary origin.  Denies alcohol use. Normal triglycerides (76 mg/dL) during last episode of pancreatitis in March. -Lipase 1,725  -T. Bili 8.2/ AST 218/ ALT 516/ ALP 123 -CT 07/31/20: New inflammatory fat stranding about the descending and transverse portions of the duodenum. Findings are consistent with either nonspecific infectious or inflammatory duodenitis or alternately groove pancreatitis. Thickening of the gallbladder wall, new compared to prior examination, and of uncertain significance, possibly reactive to adjacent inflammation. Overall constellation of findings could be related to a passed gallstone and gallstone pancreatitis.  A fib, on Eliquis, last dose 8/2  Plan: Initiate NS 150cc/hr for 24 hours, followed by 125 cc/hr.  MRCP today to evaluate for CBD stone.  Will contact surgical team pending MRCP findings.  Due to recurrent pancreatitis, he will need lap chole.  Continue to trend LFTs.  [facility] will follow.   LOS: 0 days   Edrick Kins  PA-C 07/31/2020, 12:31 PM  Contact #  [phone]

## 2020-08-01 ENCOUNTER — Encounter (HOSPITAL_COMMUNITY): Payer: Self-pay | Admitting: Internal Medicine

## 2020-08-01 DIAGNOSIS — K859 Acute pancreatitis without necrosis or infection, unspecified: Secondary | ICD-10-CM

## 2020-08-01 DIAGNOSIS — K810 Acute cholecystitis: Secondary | ICD-10-CM

## 2020-08-01 LAB — CBC
HCT: 42.6 % (ref 39.0–52.0)
Hemoglobin: 14.1 g/dL (ref 13.0–17.0)
MCH: 29.1 pg (ref 26.0–34.0)
MCHC: 33.1 g/dL (ref 30.0–36.0)
MCV: 88 fL (ref 80.0–100.0)
Platelets: 115 10*3/uL — ABNORMAL LOW (ref 150–400)
RBC: 4.84 MIL/uL (ref 4.22–5.81)
RDW: 13.9 % (ref 11.5–15.5)
WBC: 7.5 10*3/uL (ref 4.0–10.5)
nRBC: 0 % (ref 0.0–0.2)

## 2020-08-01 LAB — COMPREHENSIVE METABOLIC PANEL
ALT: 328 U/L — ABNORMAL HIGH (ref 0–44)
AST: 100 U/L — ABNORMAL HIGH (ref 15–41)
Albumin: 2.6 g/dL — ABNORMAL LOW (ref 3.5–5.0)
Alkaline Phosphatase: 110 U/L (ref 38–126)
Anion gap: 9 (ref 5–15)
BUN: 22 mg/dL (ref 8–23)
CO2: 25 mmol/L (ref 22–32)
Calcium: 8.1 mg/dL — ABNORMAL LOW (ref 8.9–10.3)
Chloride: 104 mmol/L (ref 98–111)
Creatinine, Ser: 1.36 mg/dL — ABNORMAL HIGH (ref 0.61–1.24)
GFR calc Af Amer: 57 mL/min — ABNORMAL LOW (ref >60)
GFR calc non Af Amer: 49 mL/min — ABNORMAL LOW (ref >60)
Glucose, Bld: 121 mg/dL — ABNORMAL HIGH (ref 70–99)
Potassium: 3.8 mmol/L (ref 3.5–5.1)
Sodium: 138 mmol/L (ref 135–145)
Total Bilirubin: 6.5 mg/dL — ABNORMAL HIGH (ref 0.3–1.2)
Total Protein: 5.2 g/dL — ABNORMAL LOW (ref 6.5–8.1)

## 2020-08-01 LAB — LIPASE, BLOOD: Lipase: 754 U/L — ABNORMAL HIGH (ref 11–51)

## 2020-08-01 NOTE — Plan of Care (Signed)
Plan of care initiated.

## 2020-08-01 NOTE — Progress Notes (Signed)
[facility] Gastroenterology Progress Note  Vincent Collins 79 y.o. 05/26/1941  CC:  Acute pancreatitis  Subjective: Patient feeling better today.  Has some continued abdominal pain but states it is improved.  Denies nausea/vomiting.  ROS : Review of Systems  Cardiovascular: Negative for chest pain and palpitations.  Gastrointestinal: Positive for abdominal pain. Negative for blood in stool, constipation, diarrhea, heartburn, melena, nausea and vomiting.   Objective: Vital signs in last 24 hours: Vitals:   08/01/20 0816 08/01/20 0925  BP: (!) 157/74 (!) 126/103  Pulse: (!) 57 62  Resp: 18   Temp: 97.7 F (36.5 C)   SpO2: 97%     Physical Exam:  General:  Lethargic, oriented, cooperative, no acute distress  Head:  Normocephalic, without obvious abnormality, atraumatic  Eyes:  Scleral icterus, EOM's intact,   Lungs:   Clear to auscultation bilaterally, respirations unlabored  Heart:  Mildly bradycardic with regular rhythm, S1, S2 normal  Abdomen:   Soft, moderate RUQ and epigastric tenderness (RUQ>epigastrium), bowel sounds active all four quadrants  Extremities: Extremities normal, atraumatic, no  edema  Pulses: 2+ and symmetric    Lab Results: Recent Labs    07/31/20 0333 08/01/20 0625  NA 133* 138  K 4.0 3.8  CL 98 104  CO2 24 25  GLUCOSE 186* 121*  BUN 23 22  CREATININE 1.71* 1.36*  CALCIUM 8.3* 8.1*   Recent Labs    07/31/20 0838 08/01/20 0625  AST 218* 100*  ALT 516* 328*  ALKPHOS 123 110  BILITOT 8.2* 6.5*  PROT 5.6* 5.2*  ALBUMIN 3.0* 2.6*   Recent Labs    07/31/20 0333 08/01/20 0625  WBC 12.3* 7.5  HGB 15.7 14.1  HCT 46.6 42.6  MCV 90.0 88.0  PLT 135* 115*   Recent Labs    07/31/20 0333  LABPROT 15.7*  INR 1.3*    Impression: Pancreatitis, suspect biliary origin.  Denies alcohol use. Normal triglycerides (76 mg/dL) during last episode of pancreatitis in March. -Lipase 754 today, improved from 1,725 yesterday -T. Bili 6.5/ AST 100/ ALT  328/ ALP 110 today as compared to T. Bili 8.2/ AST 218/ ALT 516/ ALP 123 yesterday -BUN 22/ Cr 1.36, improved from yesterday BUN 23/Cr 1.71 -MRCP 8/3: Consistent with acute pancreatitis. No findings for pancreatic necrosis. Normal caliber and course of the common bile duct. No common bile duct stones are identified but exam is limited by motion artifact.. Abnormal gallbladder with gallbladder wall thickening, edema and enhancement and some debris. No obvious or definite gallstones.  A fib, on Eliquis, last dose 8/2, now on  Heparin  Plan: Continue NS 150cc/hr for now.  Patient has had 2 episodes of pancreatitis in less than 6 months.  While there are no definite gallstones, suspect biliary etiology of pancreatitis.  Formal surgical consult placed for consideration of lap chole in the setting of recurrent pancreatitis and acalculous cholecystitis.  Continue to trend Creatinine and LFTs.  [facility] will follow.  Edrick Kins PA-C 08/01/2020, 9:51 AM  Contact #  [phone]

## 2020-08-01 NOTE — Progress Notes (Signed)
PROGRESS NOTE    Vincent Collins  FWY:[id number] DOB: [birth year] DOA: 07/31/2020 PCP: Dettinger, Elige Radon, MD   Brief Narrative:   Vincent Collins is a 79 y.o. male with medical history significant of paroxysmal A. fib on Eliquis, COPD, DM 2, HTN, HLD, ureteral stones, diverticulitis, pancreatitis with unknown etiology in March 2021, presented with recurrent right RUQ abdominal pain.  Symptoms started at night, cramping like 8-9/10, with fever of 101 at home and episode of chills, pain radiates to right shoulder associated with short of breath.  Feeling nauseous but no vomiting.  8/4: MRCP results noted. Continuing zosyn for cholecystitis. Sips/ice chips today. Continue supportive care.  Assessment & Plan:  Acute recurrent pancreatitis Elevated LFTs/Hyperbilirubinemia Cholecystitis     - suspicion for gallstone/sludge associated     - MRCP: 1. MR findings consistent with acute pancreatitis. No findings for pancreatic necrosis. 2. Normal caliber and course of the common bile duct. No common bile duct stones are identified but exam is limited by motion artifact. 3. Abnormal gallbladder with gallbladder wall thickening, edema and enhancement and some debris. No obvious or definite gallstones. 4. Simple appearing hepatic and renal cysts.     - GI and surgery on board; appreciate assistance     - started on zosyn for elevated WBC, fever     - WBCs improved, LFTs downtrending     - ice chips, sips today/  AKI     - SCr at admission 1.71; baseline is 0.9 - 1     - started on IVFs     - SCr down to 1.36 today  HLD     - holding home statin until resolution of elevated LFTs  Paroxysmal A. Fib     - home eliquis held d/t possible need for procedure; will resume if no procedure required     - heparin S/Q  DMt2     - last A1c (05/18/20): 6.8     - SSI, DM diet, follow  Thrombocytopenia     - plts 115 this AM, no evidenc of bleed, monitor  DVT prophylaxis: heparin Code Status: FULL Family  Communication: Spoke with wife at bedside   Status is: Inpatient  Remains inpatient appropriate because:Inpatient level of care appropriate due to severity of illness   Dispo: The patient is from: Home              Anticipated d/c is to: Home              Anticipated d/c date is: 3 days              Patient currently is not medically stable to d/c.  Consultants:   GI  General Surgery  Antimicrobials:  . Zosyn   ROS:  Denies CP, N, V. Improved ab pain . Remainder ROS is negative for all not previously mentioned.  Subjective: "They touched it in just the right place yesterday."  Objective: Vitals:   07/31/20 2245 07/31/20 2330 08/01/20 0007 08/01/20 0444  BP: (!) 141/70 (!) 141/77 (!) 151/71 (!) 153/70  Pulse: 67 70 64 65  Resp: 16 14 16 16   Temp:  97.9 F (36.6 C) 97.9 F (36.6 C) 97.7 F (36.5 C)  TempSrc:  Oral Oral Oral  SpO2: 92% 97% 97% 97%  Weight:   108.6 kg   Height:        Intake/Output Summary (Last 24 hours) at 08/01/2020 0811 Last data filed at 08/01/2020 0733 Gross per 24 hour  Intake 1776.74 ml  Output 200 ml  Net 1576.74 ml   Filed Weights   07/31/20 0319 08/01/20 0007  Weight: 122 kg 108.6 kg    Examination:  General: 79 y.o. male resting in bed in NAD Cardiovascular: RRR, +S1, S2, no m/g/r, equal pulses throughout Respiratory: CTABL, no w/r/r, normal WOB GI: BS+, NDNT, no masses noted, no organomegaly noted MSK: No e/c/c Neuro: A&O x 3, no focal deficits Psyc: Appropriate interaction and affect, calm/cooperative   Data Reviewed: I have personally reviewed following labs and imaging studies.  CBC: Recent Labs  Lab 07/31/20 0333 08/01/20 0625  WBC 12.3* 7.5  HGB 15.7 14.1  HCT 46.6 42.6  MCV 90.0 88.0  PLT 135* 115*   Basic Metabolic Panel: Recent Labs  Lab 07/31/20 0333 08/01/20 0625  NA 133* 138  K 4.0 3.8  CL 98 104  CO2 24 25  GLUCOSE 186* 121*  BUN 23 22  CREATININE 1.71* 1.36*  CALCIUM 8.3* 8.1*    GFR: Estimated Creatinine Clearance: 53.5 mL/min (A) (by C-G formula based on SCr of 1.36 mg/dL (H)). Liver Function Tests: Recent Labs  Lab 07/31/20 0838 08/01/20 0625  AST 218* 100*  ALT 516* 328*  ALKPHOS 123 110  BILITOT 8.2* 6.5*  PROT 5.6* 5.2*  ALBUMIN 3.0* 2.6*   Recent Labs  Lab 07/31/20 0838  LIPASE 1,725*   No results for input(s): AMMONIA in the last 168 hours. Coagulation Profile: Recent Labs  Lab 07/31/20 0333  INR 1.3*   Cardiac Enzymes: No results for input(s): CKTOTAL, CKMB, CKMBINDEX, TROPONINI in the last 168 hours. BNP (last 3 results) No results for input(s): PROBNP in the last 8760 hours. HbA1C: No results for input(s): HGBA1C in the last 72 hours. CBG: No results for input(s): GLUCAP in the last 168 hours. Lipid Profile: No results for input(s): CHOL, HDL, LDLCALC, TRIG, CHOLHDL, LDLDIRECT in the last 72 hours. Thyroid Function Tests: No results for input(s): TSH, T4TOTAL, FREET4, T3FREE, THYROIDAB in the last 72 hours. Anemia Panel: No results for input(s): VITAMINB12, FOLATE, FERRITIN, TIBC, IRON, RETICCTPCT in the last 72 hours. Sepsis Labs: No results for input(s): PROCALCITON, LATICACIDVEN in the last 168 hours.  Recent Results (from the past 240 hour(s))  SARS Coronavirus 2 by RT PCR (hospital order, performed in [facility] hospital lab) Nasopharyngeal Nasopharyngeal Swab     Status: None   Collection Time: 07/31/20  1:46 PM   Specimen: Nasopharyngeal Swab  Result Value Ref Range Status   SARS Coronavirus 2 NEGATIVE NEGATIVE Final    Comment: (NOTE) SARS-CoV-2 target nucleic acids are NOT DETECTED.  The SARS-CoV-2 RNA is generally detectable in upper and lower respiratory specimens during the acute phase of infection. The lowest concentration of SARS-CoV-2 viral copies this assay can detect is 250 copies / mL. A negative result does not preclude SARS-CoV-2 infection and should not be used as the sole basis for treatment or  other patient management decisions.  A negative result may occur with improper specimen collection / handling, submission of specimen other than nasopharyngeal swab, presence of viral mutation(s) within the areas targeted by this assay, and inadequate number of viral copies (<250 copies / mL). A negative result must be combined with clinical observations, patient history, and epidemiological information.  Fact Sheet for Patients:   BoilerBrush.com.cy  Fact Sheet for Healthcare Providers: https://pope.com/  This test is not yet approved or  cleared by the [place] FDA and has been authorized for detection and/or diagnosis of SARS-CoV-2  by FDA under an Emergency Use Authorization (EUA).  This EUA will remain in effect (meaning this test can be used) for the duration of the COVID-19 declaration under Section 564(b)(1) of the Act, 21 U.S.C. section 360bbb-3(b)(1), unless the authorization is terminated or revoked sooner.  Performed at [facility], [address] [place], [place] [address]       Radiology Studies: DG Chest 2 View  Result Date: 07/31/2020 CLINICAL DATA:  Chest pain EXAM: CHEST - 2 VIEW COMPARISON:  03/28/2020 FINDINGS: Subsegmental atelectasis at the bases. No consolidation or pleural effusion. Borderline cardiomegaly with aortic atherosclerosis. No pneumothorax. IMPRESSION: Mildly low lung volumes with subsegmental atelectasis at the bases. Electronically Signed   By: Jasmine PangKim  Fujinaga M.D.   On: 07/31/2020 03:40   CT ABDOMEN PELVIS W CONTRAST  Result Date: 07/31/2020 CLINICAL DATA:  Epigastric pain, tenderness to palpation EXAM: CT ABDOMEN AND PELVIS WITH CONTRAST TECHNIQUE: Multidetector CT imaging of the abdomen and pelvis was performed using the standard protocol following bolus administration of intravenous contrast. CONTRAST:  80mL OMNIPAQUE IOHEXOL 300 MG/ML  SOLN COMPARISON:  07/29/2020 FINDINGS: Lower chest:  No acute abnormality. Scarring of the included bilateral lung bases. Hepatobiliary: No solid liver abnormality is seen. No gallstones or biliary dilatation. Thickening of the gallbladder wall, new compared to prior examination. (Series 6, image 28). Pancreas: Mild inflammatory fat stranding about the inferior head of the pancreas adjacent to the duodenum as below. No pancreatic ductal dilatation or surrounding inflammatory changes. Spleen: Normal in size without significant abnormality. Adrenals/Urinary Tract: Adrenal glands are unremarkable. Kidneys are normal, without renal calculi, solid lesion, or hydronephrosis. Bladder is unremarkable. Stomach/Bowel: Stomach is within normal limits. There is new inflammatory fat stranding about the descending and transverse portions of the duodenum (series 6, image 43). Incidental diverticulum of the transverse duodenum. Appendix appears normal. No evidence of bowel wall thickening, distention, or inflammatory changes. Colonic diverticulosis. Vascular/Lymphatic: Aortic atherosclerosis. No enlarged abdominal or pelvic lymph nodes. Reproductive: Mild prostatomegaly. Other: Fat containing umbilical hernia.  No abdominopelvic ascites. Musculoskeletal: No acute or significant osseous findings. IMPRESSION: 1. There is new inflammatory fat stranding about the descending and transverse portions of the duodenum. Findings are consistent with either nonspecific infectious or inflammatory duodenitis or alternately groove pancreatitis. 2. Thickening of the gallbladder wall, new compared to prior examination, and of uncertain significance, possibly reactive to adjacent inflammation. 3. Overall constellation of findings could be related to a passed gallstone and gallstone pancreatitis. 4. Colonic diverticulosis without evidence for diverticulitis. 5. Mild prostatomegaly. 6. Aortic Atherosclerosis (ICD10-I70.0). Electronically Signed   By: Lauralyn PrimesAlex  Bibbey M.D.   On: 07/31/2020 11:57   MR 3D  Recon At Scanner  Result Date: 07/31/2020 CLINICAL DATA:  Acute pancreatitis. EXAM: MRI ABDOMEN WITHOUT AND WITH CONTRAST (INCLUDING MRCP) TECHNIQUE: Multiplanar multisequence MR imaging of the abdomen was performed both before and after the administration of intravenous contrast. Heavily T2-weighted images of the biliary and pancreatic ducts were obtained, and three-dimensional MRCP images were rendered by post processing. CONTRAST:  10mL GADAVIST GADOBUTROL 1 MMOL/ML IV SOLN COMPARISON:  CT scan 07/31/2020 FINDINGS: Lower chest: The lung bases are clear of an acute process. No pleural effusions. No pericardial effusion. Hepatobiliary: Small hepatic cysts are noted. No worrisome hepatic lesions. No intrahepatic biliary dilatation. Normal caliber and course of the common bile duct. No common bile duct stones are identified but exam is limited by motion artifact. The gallbladder is abnormal. There is gallbladder wall thickening, gallbladder wall edema and some  debris. No obvious or definite gallstones. Pancreas: MR findings consistent with acute pancreatitis. Diffuse inflammation/edema involving the pancreas and peripancreatic tissues. There is also moderate inflammation of the second and third portions of the duodenum. Normal pancreatic enhancement. No findings for pancreatic necrosis. Spleen:  Normal size.  No focal lesions. Adrenals/Urinary Tract: The adrenal glands and kidneys are unremarkable. Small renal cysts are noted. Stomach/Bowel: The stomach is unremarkable. Inflammation of the duodenum is secondary to pancreatitis. The visualized small bowel and colon are grossly normal. Vascular/Lymphatic: The aorta and branch vessels are patent. The major venous structures are patent. No retroperitoneal adenopathy. Other:  No ascites or abdominal wall hernia. Musculoskeletal: No significant bony findings. IMPRESSION: 1. MR findings consistent with acute pancreatitis. No findings for pancreatic necrosis. 2. Normal  caliber and course of the common bile duct. No common bile duct stones are identified but exam is limited by motion artifact. 3. Abnormal gallbladder with gallbladder wall thickening, edema and enhancement and some debris. No obvious or definite gallstones. 4. Simple appearing hepatic and renal cysts. Electronically Signed   By: Rudie Meyer M.D.   On: 07/31/2020 18:40   MR ABDOMEN MRCP W WO CONTAST  Result Date: 07/31/2020 CLINICAL DATA:  Acute pancreatitis. EXAM: MRI ABDOMEN WITHOUT AND WITH CONTRAST (INCLUDING MRCP) TECHNIQUE: Multiplanar multisequence MR imaging of the abdomen was performed both before and after the administration of intravenous contrast. Heavily T2-weighted images of the biliary and pancreatic ducts were obtained, and three-dimensional MRCP images were rendered by post processing. CONTRAST:  29mL GADAVIST GADOBUTROL 1 MMOL/ML IV SOLN COMPARISON:  CT scan 07/31/2020 FINDINGS: Lower chest: The lung bases are clear of an acute process. No pleural effusions. No pericardial effusion. Hepatobiliary: Small hepatic cysts are noted. No worrisome hepatic lesions. No intrahepatic biliary dilatation. Normal caliber and course of the common bile duct. No common bile duct stones are identified but exam is limited by motion artifact. The gallbladder is abnormal. There is gallbladder wall thickening, gallbladder wall edema and some debris. No obvious or definite gallstones. Pancreas: MR findings consistent with acute pancreatitis. Diffuse inflammation/edema involving the pancreas and peripancreatic tissues. There is also moderate inflammation of the second and third portions of the duodenum. Normal pancreatic enhancement. No findings for pancreatic necrosis. Spleen:  Normal size.  No focal lesions. Adrenals/Urinary Tract: The adrenal glands and kidneys are unremarkable. Small renal cysts are noted. Stomach/Bowel: The stomach is unremarkable. Inflammation of the duodenum is secondary to pancreatitis. The  visualized small bowel and colon are grossly normal. Vascular/Lymphatic: The aorta and branch vessels are patent. The major venous structures are patent. No retroperitoneal adenopathy. Other:  No ascites or abdominal wall hernia. Musculoskeletal: No significant bony findings. IMPRESSION: 1. MR findings consistent with acute pancreatitis. No findings for pancreatic necrosis. 2. Normal caliber and course of the common bile duct. No common bile duct stones are identified but exam is limited by motion artifact. 3. Abnormal gallbladder with gallbladder wall thickening, edema and enhancement and some debris. No obvious or definite gallstones. 4. Simple appearing hepatic and renal cysts. Electronically Signed   By: Rudie Meyer M.D.   On: 07/31/2020 18:40     Scheduled Meds: . cholecalciferol  1,000 Units Oral Daily  . heparin  5,000 Units Subcutaneous Q12H  . metoprolol tartrate  25 mg Oral BID  . pantoprazole  40 mg Oral Daily  . sodium chloride flush  3 mL Intravenous Once  . tamsulosin  0.8 mg Oral QPC supper   Continuous Infusions: . sodium chloride  Stopped (07/31/20 2221)  . sodium chloride 100 mL/hr at 08/01/20 0733  . piperacillin-tazobactam (ZOSYN)  IV 3.375 g (08/01/20 0529)     LOS: 1 day    Time spent: 25 minutes spent in the coordination of care today.    Teddy Spike, DO [facility]  If 7PM-7AM, please contact night-coverage [email or web address] 08/01/2020, 8:11 AM

## 2020-08-01 NOTE — Consult Note (Signed)
[name] 18-May-1941  [id number].    Requesting MD: Dr. Bosie Clos Chief Complaint/Reason for Consult: Recurrent Pancreatitis, possible acalculous cholecystitis  HPI: Vincent Collins is a 79 y.o. male with a history of pancreatitis, A. Fib on Eliquis (last dose 8/2), HTN, HLD, DM2, COPD (not on home o2) who presented to [facility] for epigastric/right upper quadrant abdominal pain.  Patient reports on 7/31 after eating squash and pinto beans he began having sharp/cramping epigastric/right upper quadrant abdominal pain that radiated to his back and was severe.  He notes his pain was constant and associated with fever, chills, nausea and an episode of emesis.  He feels this was similar to when he had pancreatitis in March.  Patient presented to the ED and was found to have elevated LFTs, lipase 1725, WBC 12.3, and CT that showed possible duodenitis versus pancreatitis.  CT also showed thickening of the gallbladder wall of uncertain significance, possibly reactive to adjacent inflammation.  Patient was admitted to medicine and GI was consulted.  MRCP was performed that were consistent with acute pancreatitis.  There is no evidence of choledocholithiasis or cholelithiasis.  There was some gallbladder wall thickening, gallbladder wall edema and debris.  We were asked to see.  Patient was seen/admitted for pancreatitis in March of this year. Patient denies any alcohol use since the age of 77. He had triglycerides tested during that admission that were normal.  He was seen by our service.  At that time he was found to have no evidence of gallstones on multiple imaging modalities.  He was treated with antibiotics and followed up with Dr. Magnus Ivan in the office.  He reports that he did follow-up in the office and decided to hold off on cholecystectomy as outpatient unless his symptoms recurred.  Currently the patient has no pain but reports soreness in his epigastrium and right upper quadrant.  He reports that he  does have quite a bit of tenderness with palpation to the epigastrium and right upper quadrant.  He is currently n.p.o. and denies any nausea or emesis. No changes in bowel movements. No urinary symptoms. WBC has normalized today, LFTs downtrending. T. Bili down from 8.2 to 6.5. Lipase 754.  No prior abdominal surgeries. Reports he is able to walk > 581ft and walk stairs without becoming short of breath  ROS: Review of Systems  Constitutional: Positive for chills and fever.  Respiratory: Negative for cough and shortness of breath.   Cardiovascular: Negative for chest pain and leg swelling.  Gastrointestinal: Positive for abdominal pain, nausea and vomiting. Negative for blood in stool, constipation and diarrhea.  Genitourinary: Negative for dysuria.  Musculoskeletal: Positive for back pain.  Neurological: Negative for weakness.  Psychiatric/Behavioral: Negative for substance abuse.  All other systems reviewed and are negative.   Family History  Problem Relation Age of Onset   Early death Father        MI age 92   Heart attack Father    COPD Brother    Cancer Sister     Past Medical History:  Diagnosis Date   Arrhythmia    Broken leg and ribs, right, closed, initial encounter    COPD (chronic obstructive pulmonary disease) (HCC)    Diabetes mellitus without complication (HCC)    Diverticulitis    GERD (gastroesophageal reflux disease)    Hydronephrosis of left kidney    Hyperlipidemia    Hypertension    Left ureteral stone    obstructing   Renal calculus or  stone 2015    Past Surgical History:  Procedure Laterality Date   2d echo  04/04/09   CYSTOSCOPY WITH RETROGRADE PYELOGRAM, URETEROSCOPY AND STENT PLACEMENT Left 10/11/2014   Procedure: CYSTOSCOPY WITH RETROGRADE PYELOGRAM, DIAGNOSTIC URETEROSCOPY AND STENT PLACEMENT;  Surgeon: [name], MD;  Location: [facility];  Service: Urology;  Laterality: Left;   LUMBAR DISC SURGERY      SPINE SURGERY      Social History:  reports that he quit smoking about 53 years ago. His smoking use included cigarettes. He has quit using smokeless tobacco.  His smokeless tobacco use included chew. He reports that he does not drink alcohol and does not use drugs. 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 ) 720 mL 2   apixaban (ELIQUIS) 5 MG TABS tablet Take 1 tablet (5 mg total) by mouth 2 (two) times daily. 60 tablet 2   atorvastatin (LIPITOR) 40 MG tablet TAKE 1 TABLET BY MOUTH  DAILY (Patient taking differently: Take 40 mg by mouth daily. ) 90 tablet 1   Cholecalciferol (VITAMIN D) 1000 UNITS capsule Take 1,000 Units by mouth in the morning.      Coenzyme Q10-Fish Oil-Vit E (CO-Q 10 OMEGA-3 FISH OIL) CAPS Take 1 capsule by mouth in the morning. 100mg  Capsule Qunol     dicyclomine (BENTYL) 20 MG tablet Take 20 mg by mouth 3 (three) times daily as needed for spasms.      lisinopril (ZESTRIL) 20 MG tablet TAKE 1 TABLET BY MOUTH  DAILY (Patient taking differently: Take 20 mg by mouth daily. ) 90 tablet 1   metFORMIN (GLUCOPHAGE-XR) 500 MG 24 hr tablet TAKE 1 TABLET BY MOUTH  TWICE DAILY (Patient taking differently: Take 500 mg by mouth in the morning and at bedtime. ) 180 tablet 0   metoprolol succinate (TOPROL-XL) 50 MG 24 hr tablet TAKE 1 TABLET BY MOUTH  DAILY WITH OR IMMEDIATELY  FOLLOWING A MEAL (Patient taking differently: Take 50 mg by mouth daily. ) 90 tablet 1    omeprazole (PRILOSEC) 20 MG capsule TAKE 1 CAPSULE BY MOUTH  DAILY (Patient taking differently: Take 20 mg by mouth daily. ) 90 capsule 1   ondansetron (ZOFRAN-ODT) 4 MG disintegrating tablet Take 4 mg by mouth every 8 (eight) hours as needed for nausea.      tamsulosin (FLOMAX) 0.4 MG CAPS capsule TAKE 2 CAPSULES BY MOUTH  DAILY AFTER SUPPER (Patient taking differently: Take 0.8 mg by mouth daily after supper. ) 180 capsule 0   fluticasone (FLONASE) 50 MCG/ACT nasal spray USE 1 SPRAY INTO BOTH  NOSTRILS 2 TIMES DAILY AS  NEEDED FOR ALLERGIES OR  RHINITIS (Patient not taking: Reported on 07/31/2020) 48 g 1     Physical Exam: Blood pressure (!) 161/74, pulse (!) 57, temperature 97.7 F (36.5 C), temperature source Oral, resp. rate 18, height 5\' 8"  (1.727 m), weight 108.6 kg, SpO2 95 %. General: pleasant, WD/WN white male who is  laying in bed in NAD HEENT: head is normocephalic, atraumatic.  Sclera are noninjected.  PERRL.  Ears and nose without any masses or lesions.  Mouth is pink and moist. Dentition fair Heart: Irregularly, Irregular.  Normal s1,s2. No obvious murmurs, gallops, or rubs noted.  Palpable pedal pulses bilaterally  Lungs: CTAB, no wheezes, rhonchi, or rales noted.  Respiratory effort nonlabored Abd: Soft, protuberant, mild distension, tenderness of the epigastrium and RUQ without rebound, rigidity or guarding. +BS. Small umbilical hernia that is reducible. No masses or organomegaly MS: no BUE/BLE edema, calves soft and nontender Skin: warm and dry with no masses, lesions, or rashes Psych: A&Ox4 with an appropriate affect Neuro: cranial nerves grossly intact, equal strength in BUE/BLE bilaterally, normal speech, though process intact  Results for orders placed or performed during the hospital encounter of 07/31/20 (from the past 48 hour(s))  Basic metabolic panel     Status: Abnormal   Collection Time: 07/31/20  3:33 AM  Result Value Ref Range   Sodium 133 (L) 135 - 145 mmol/L     Potassium 4.0 3.5 - 5.1 mmol/L   Chloride 98 98 - 111 mmol/L   CO2 24 22 - 32 mmol/L   Glucose, Bld 186 (H) 70 - 99 mg/dL    Comment: Glucose reference range applies only to samples taken after fasting for at least 8 hours.   BUN 23 8 - 23 mg/dL   Creatinine, Ser 9.23 (H) 0.61 - 1.24 mg/dL   Calcium 8.3 (L) 8.9 - 10.3 mg/dL   GFR calc non Af Amer 38 (L) >60 mL/min   GFR calc Af Amer 43 (L) >60 mL/min   Anion gap 11 5 - 15    Comment: Performed at [facility], [address], [place], [place] [address]  CBC     Status: Abnormal   Collection Time: 07/31/20  3:33 AM  Result Value Ref Range   WBC 12.3 (H) 4.0 - 10.5 K/uL   RBC 5.18 4.22 - 5.81 MIL/uL   Hemoglobin 15.7 13.0 - 17.0 g/dL   HCT 22.6 39 - 52 %   MCV 90.0 80.0 - 100.0 fL   MCH 30.3 26.0 - 34.0 pg   MCHC 33.7 30.0 - 36.0 g/dL   RDW 33.3 54.5 - 62.5 %   Platelets 135 (L) 150 - 400 K/uL    Comment: REPEATED TO VERIFY   nRBC 0.0 0.0 - 0.2 %    Comment: Performed at [facility], [address] [place], [place] [address]  Troponin I (High Sensitivity)     Status: None   Collection Time: 07/31/20  3:33 AM  Result Value Ref Range   Troponin I (High Sensitivity) 16 <18 ng/L    Comment: (NOTE) Elevated high sensitivity troponin I (hsTnI) values and significant  changes across serial measurements may suggest ACS but many other  chronic and acute conditions are known to elevate hsTnI results.  Refer to the "Links" section for chest pain algorithms and additional  guidance. Performed at [facility], [address], [place], [place] [address]   Protime-INR (order if Patient is taking Coumadin / Warfarin)     Status: Abnormal   Collection Time: 07/31/20  3:33 AM  Result Value Ref Range   Prothrombin Time 15.7 (H) 11.4 - 15.2 seconds   INR 1.3 (H) 0.8 - 1.2    Comment: (NOTE) INR goal varies based on device and disease states. Performed at [facility], [address] [place], [place] [address]    Troponin I (High Sensitivity)     Status: None   Collection Time: 07/31/20  5:45 AM  Result Value Ref Range   Troponin I (High Sensitivity) 17 <18 ng/L    Comment: (NOTE) Elevated high sensitivity troponin I (hsTnI) values and significant  changes across serial measurements may suggest ACS but many other  chronic and acute conditions are known to elevate hsTnI results.  Refer to the "Links" section for chest pain algorithms and additional  guidance. Performed at [facility], [address] [place], [place] [address]   Hepatic function panel     Status: Abnormal   Collection Time: 07/31/20  8:38 AM  Result Value Ref Range   Total Protein 5.6 (L) 6.5 - 8.1 g/dL   Albumin 3.0 (L) 3.5 - 5.0 g/dL   AST 147218 (H) 15 - 41 U/L   ALT 516 (H) 0 - 44 U/L   Alkaline Phosphatase 123 38 - 126 U/L   Total Bilirubin 8.2 (H) 0.3 - 1.2 mg/dL   Bilirubin, Direct 5.4 (H) 0.0 - 0.2 mg/dL   Indirect Bilirubin 2.8 (H) 0.3 - 0.9 mg/dL    Comment: Performed at [facility], [address] [place], [place] [address]  Lipase, blood     Status: Abnormal   Collection Time: 07/31/20  8:38 AM  Result Value Ref Range   Lipase 1,725 (H) 11 - 51 U/L    Comment: RESULTS CONFIRMED BY MANUAL DILUTION Performed at [facility], [address] [place], [place] [address]   SARS Coronavirus 2 by RT PCR (hospital order, performed in [facility] hospital lab) Nasopharyngeal Nasopharyngeal Swab     Status: None   Collection Time: 07/31/20  1:46 PM   Specimen: Nasopharyngeal Swab  Result Value Ref Range   SARS Coronavirus 2 NEGATIVE NEGATIVE    Comment: (NOTE) SARS-CoV-2 target nucleic acids are NOT DETECTED.  The SARS-CoV-2 RNA is generally detectable in upper and lower respiratory specimens during the acute phase of infection. The lowest concentration of SARS-CoV-2 viral copies this assay can detect is 250 copies / mL. A negative result does not preclude SARS-CoV-2 infection and should not be  used as the sole basis for treatment or other patient management decisions.  A negative result may occur with improper specimen collection / handling, submission of specimen other than nasopharyngeal swab, presence of viral mutation(s) within the areas targeted by this assay, and inadequate number of viral copies (<250 copies / mL). A negative result must be combined with clinical observations, patient history, and epidemiological information.  Fact Sheet for Patients:   BoilerBrush.com.cyhttps://www.fda.gov/media/136312/download  Fact Sheet for Healthcare Providers: https://pope.com/https://www.fda.gov/media/136313/download  This test is not yet approved or  cleared by the [place] FDA and has been authorized for detection and/or diagnosis of SARS-CoV-2 by FDA under an Emergency Use Authorization (EUA).  This EUA will remain in effect (meaning this test can be used) for the duration of the COVID-19 declaration under Section 564(b)(1) of the Act, 21 U.S.C. section 360bbb-3(b)(1), unless the authorization is terminated or revoked sooner.  Performed at [facility], [address] [place], [place] [address]   Comprehensive metabolic panel     Status: Abnormal   Collection Time: 08/01/20  6:25 AM  Result Value Ref Range   Sodium 138 135 - 145 mmol/L   Potassium 3.8 3.5 - 5.1 mmol/L   Chloride 104 98 - 111 mmol/L  CO2 25 22 - 32 mmol/L   Glucose, Bld 121 (H) 70 - 99 mg/dL    Comment: Glucose reference range applies only to samples taken after fasting for at least 8 hours.   BUN 22 8 - 23 mg/dL   Creatinine, Ser 9.60 (H) 0.61 - 1.24 mg/dL   Calcium 8.1 (L) 8.9 - 10.3 mg/dL   Total Protein 5.2 (L) 6.5 - 8.1 g/dL   Albumin 2.6 (L) 3.5 - 5.0 g/dL   AST 454 (H) 15 - 41 U/L   ALT 328 (H) 0 - 44 U/L   Alkaline Phosphatase 110 38 - 126 U/L   Total Bilirubin 6.5 (H) 0.3 - 1.2 mg/dL   GFR calc non Af Amer 49 (L) >60 mL/min   GFR calc Af Amer 57 (L) >60 mL/min   Anion gap 9 5 - 15    Comment: Performed at  [facility], [address] [place], [place] [address]  CBC     Status: Abnormal   Collection Time: 08/01/20  6:25 AM  Result Value Ref Range   WBC 7.5 4.0 - 10.5 K/uL   RBC 4.84 4.22 - 5.81 MIL/uL   Hemoglobin 14.1 13.0 - 17.0 g/dL   HCT 91.4 39 - 52 %   MCV 88.0 80.0 - 100.0 fL   MCH 29.1 26.0 - 34.0 pg   MCHC 33.1 30.0 - 36.0 g/dL   RDW 78.2 95.6 - 21.3 %   Platelets 115 (L) 150 - 400 K/uL    Comment: REPEATED TO VERIFY PLATELET COUNT CONFIRMED BY SMEAR Immature Platelet Fraction may be clinically indicated, consider ordering this additional test [id number]    nRBC 0.0 0.0 - 0.2 %    Comment: Performed at [facility], [address], [place], [place] [address]  Lipase, blood     Status: Abnormal   Collection Time: 08/01/20  6:25 AM  Result Value Ref Range   Lipase 754 (H) 11 - 51 U/L    Comment: RESULTS CONFIRMED BY MANUAL DILUTION Performed at [facility], [address] [place], [place] [address]    DG Chest 2 View  Result Date: 07/31/2020 CLINICAL DATA:  Chest pain EXAM: CHEST - 2 VIEW COMPARISON:  03/28/2020 FINDINGS: Subsegmental atelectasis at the bases. No consolidation or pleural effusion. Borderline cardiomegaly with aortic atherosclerosis. No pneumothorax. IMPRESSION: Mildly low lung volumes with subsegmental atelectasis at the bases. Electronically Signed   By: Jasmine Pang M.D.   On: 07/31/2020 03:40   CT ABDOMEN PELVIS W CONTRAST  Result Date: 07/31/2020 CLINICAL DATA:  Epigastric pain, tenderness to palpation EXAM: CT ABDOMEN AND PELVIS WITH CONTRAST TECHNIQUE: Multidetector CT imaging of the abdomen and pelvis was performed using the standard protocol following bolus administration of intravenous contrast. CONTRAST:  80mL OMNIPAQUE IOHEXOL 300 MG/ML  SOLN COMPARISON:  07/29/2020 FINDINGS: Lower chest: No acute abnormality. Scarring of the included bilateral lung bases. Hepatobiliary: No solid liver abnormality is seen. No gallstones or biliary  dilatation. Thickening of the gallbladder wall, new compared to prior examination. (Series 6, image 28). Pancreas: Mild inflammatory fat stranding about the inferior head of the pancreas adjacent to the duodenum as below. No pancreatic ductal dilatation or surrounding inflammatory changes. Spleen: Normal in size without significant abnormality. Adrenals/Urinary Tract: Adrenal glands are unremarkable. Kidneys are normal, without renal calculi, solid lesion, or hydronephrosis. Bladder is unremarkable. Stomach/Bowel: Stomach is within normal limits. There is new inflammatory fat stranding about the descending and transverse portions of  the duodenum (series 6, image 43). Incidental diverticulum of the transverse duodenum. Appendix appears normal. No evidence of bowel wall thickening, distention, or inflammatory changes. Colonic diverticulosis. Vascular/Lymphatic: Aortic atherosclerosis. No enlarged abdominal or pelvic lymph nodes. Reproductive: Mild prostatomegaly. Other: Fat containing umbilical hernia.  No abdominopelvic ascites. Musculoskeletal: No acute or significant osseous findings. IMPRESSION: 1. There is new inflammatory fat stranding about the descending and transverse portions of the duodenum. Findings are consistent with either nonspecific infectious or inflammatory duodenitis or alternately groove pancreatitis. 2. Thickening of the gallbladder wall, new compared to prior examination, and of uncertain significance, possibly reactive to adjacent inflammation. 3. Overall constellation of findings could be related to a passed gallstone and gallstone pancreatitis. 4. Colonic diverticulosis without evidence for diverticulitis. 5. Mild prostatomegaly. 6. Aortic Atherosclerosis (ICD10-I70.0). Electronically Signed   By: Lauralyn Primes M.D.   On: 07/31/2020 11:57   MR 3D Recon At Scanner  Result Date: 07/31/2020 CLINICAL DATA:  Acute pancreatitis. EXAM: MRI ABDOMEN WITHOUT AND WITH CONTRAST (INCLUDING MRCP)  TECHNIQUE: Multiplanar multisequence MR imaging of the abdomen was performed both before and after the administration of intravenous contrast. Heavily T2-weighted images of the biliary and pancreatic ducts were obtained, and three-dimensional MRCP images were rendered by post processing. CONTRAST:  40mL GADAVIST GADOBUTROL 1 MMOL/ML IV SOLN COMPARISON:  CT scan 07/31/2020 FINDINGS: Lower chest: The lung bases are clear of an acute process. No pleural effusions. No pericardial effusion. Hepatobiliary: Small hepatic cysts are noted. No worrisome hepatic lesions. No intrahepatic biliary dilatation. Normal caliber and course of the common bile duct. No common bile duct stones are identified but exam is limited by motion artifact. The gallbladder is abnormal. There is gallbladder wall thickening, gallbladder wall edema and some debris. No obvious or definite gallstones. Pancreas: MR findings consistent with acute pancreatitis. Diffuse inflammation/edema involving the pancreas and peripancreatic tissues. There is also moderate inflammation of the second and third portions of the duodenum. Normal pancreatic enhancement. No findings for pancreatic necrosis. Spleen:  Normal size.  No focal lesions. Adrenals/Urinary Tract: The adrenal glands and kidneys are unremarkable. Small renal cysts are noted. Stomach/Bowel: The stomach is unremarkable. Inflammation of the duodenum is secondary to pancreatitis. The visualized small bowel and colon are grossly normal. Vascular/Lymphatic: The aorta and branch vessels are patent. The major venous structures are patent. No retroperitoneal adenopathy. Other:  No ascites or abdominal wall hernia. Musculoskeletal: No significant bony findings. IMPRESSION: 1. MR findings consistent with acute pancreatitis. No findings for pancreatic necrosis. 2. Normal caliber and course of the common bile duct. No common bile duct stones are identified but exam is limited by motion artifact. 3. Abnormal  gallbladder with gallbladder wall thickening, edema and enhancement and some debris. No obvious or definite gallstones. 4. Simple appearing hepatic and renal cysts. Electronically Signed   By: Rudie Meyer M.D.   On: 07/31/2020 18:40   MR ABDOMEN MRCP W WO CONTAST  Result Date: 07/31/2020 CLINICAL DATA:  Acute pancreatitis. EXAM: MRI ABDOMEN WITHOUT AND WITH CONTRAST (INCLUDING MRCP) TECHNIQUE: Multiplanar multisequence MR imaging of the abdomen was performed both before and after the administration of intravenous contrast. Heavily T2-weighted images of the biliary and pancreatic ducts were obtained, and three-dimensional MRCP images were rendered by post processing. CONTRAST:  75mL GADAVIST GADOBUTROL 1 MMOL/ML IV SOLN COMPARISON:  CT scan 07/31/2020 FINDINGS: Lower chest: The lung bases are clear of an acute process. No pleural effusions. No pericardial effusion. Hepatobiliary: Small hepatic cysts are noted. No worrisome hepatic lesions. No  intrahepatic biliary dilatation. Normal caliber and course of the common bile duct. No common bile duct stones are identified but exam is limited by motion artifact. The gallbladder is abnormal. There is gallbladder wall thickening, gallbladder wall edema and some debris. No obvious or definite gallstones. Pancreas: MR findings consistent with acute pancreatitis. Diffuse inflammation/edema involving the pancreas and peripancreatic tissues. There is also moderate inflammation of the second and third portions of the duodenum. Normal pancreatic enhancement. No findings for pancreatic necrosis. Spleen:  Normal size.  No focal lesions. Adrenals/Urinary Tract: The adrenal glands and kidneys are unremarkable. Small renal cysts are noted. Stomach/Bowel: The stomach is unremarkable. Inflammation of the duodenum is secondary to pancreatitis. The visualized small bowel and colon are grossly normal. Vascular/Lymphatic: The aorta and branch vessels are patent. The major venous  structures are patent. No retroperitoneal adenopathy. Other:  No ascites or abdominal wall hernia. Musculoskeletal: No significant bony findings. IMPRESSION: 1. MR findings consistent with acute pancreatitis. No findings for pancreatic necrosis. 2. Normal caliber and course of the common bile duct. No common bile duct stones are identified but exam is limited by motion artifact. 3. Abnormal gallbladder with gallbladder wall thickening, edema and enhancement and some debris. No obvious or definite gallstones. 4. Simple appearing hepatic and renal cysts. Electronically Signed   By: Rudie Meyer M.D.   On: 07/31/2020 18:40   Anti-infectives (From admission, onward)   Start     Dose/Rate Route Frequency Ordered Stop   07/31/20 1430  piperacillin-tazobactam (ZOSYN) IVPB 3.375 g     Discontinue     3.375 g 12.5 mL/hr over 240 Minutes Intravenous Every 8 hours 07/31/20 1427         Assessment/Plan A. Fib on Eliquis (last dose 8/2) HTN HLD DM2 COPD (not on home o2)   Recurrent Pancreatitis Possible Acalculous Cholecystitis - Triglycerides 3/21 were 77. No alcohol use since the age of 40.  - MRCP today shows no evidence of choledocholithiasis or cholelithiasis.  There was some gallbladder wall thickening, gallbladder wall edema and debris. - WBC normalized. LFT's improving. T. Bili 8.2 > 6.5 - Lipase 1725 > 754 - Before considering Laparoscopic Cholecystectomy, patients pancreatitis will need to resolve. We will continue following.   FEN - NPO, IVF VTE - SCDs, please hold Eliquis, on subq heparin  ID - On Zosyn for possible Acalculous Cholecystitis  Jacinto Halim, [facility] 08/01/2020, 1:59 PM Please see Amion for pager number during day hours 7:00am-4:30pm

## 2020-08-02 LAB — LIPASE, BLOOD: Lipase: 190 U/L — ABNORMAL HIGH (ref 11–51)

## 2020-08-02 LAB — CBC WITH DIFFERENTIAL/PLATELET
Abs Immature Granulocytes: 0.04 10*3/uL (ref 0.00–0.07)
Basophils Absolute: 0.1 10*3/uL (ref 0.0–0.1)
Basophils Relative: 1 %
Eosinophils Absolute: 0.2 10*3/uL (ref 0.0–0.5)
Eosinophils Relative: 2 %
HCT: 40.6 % (ref 39.0–52.0)
Hemoglobin: 13.8 g/dL (ref 13.0–17.0)
Immature Granulocytes: 1 %
Lymphocytes Relative: 11 %
Lymphs Abs: 0.7 10*3/uL (ref 0.7–4.0)
MCH: 30.1 pg (ref 26.0–34.0)
MCHC: 34 g/dL (ref 30.0–36.0)
MCV: 88.6 fL (ref 80.0–100.0)
Monocytes Absolute: 0.7 10*3/uL (ref 0.1–1.0)
Monocytes Relative: 12 %
Neutro Abs: 4.5 10*3/uL (ref 1.7–7.7)
Neutrophils Relative %: 73 %
Platelets: 128 10*3/uL — ABNORMAL LOW (ref 150–400)
RBC: 4.58 MIL/uL (ref 4.22–5.81)
RDW: 13.8 % (ref 11.5–15.5)
WBC: 6.2 10*3/uL (ref 4.0–10.5)
nRBC: 0 % (ref 0.0–0.2)

## 2020-08-02 LAB — RENAL FUNCTION PANEL
Albumin: 2.5 g/dL — ABNORMAL LOW (ref 3.5–5.0)
Anion gap: 9 (ref 5–15)
BUN: 16 mg/dL (ref 8–23)
CO2: 22 mmol/L (ref 22–32)
Calcium: 8.2 mg/dL — ABNORMAL LOW (ref 8.9–10.3)
Chloride: 106 mmol/L (ref 98–111)
Creatinine, Ser: 1.1 mg/dL (ref 0.61–1.24)
GFR calc Af Amer: >60 mL/min (ref >60)
GFR calc non Af Amer: >60 mL/min (ref >60)
Glucose, Bld: 88 mg/dL (ref 70–99)
Phosphorus: 2.6 mg/dL (ref 2.5–4.6)
Potassium: 3.5 mmol/L (ref 3.5–5.1)
Sodium: 137 mmol/L (ref 135–145)

## 2020-08-02 LAB — HEPATIC FUNCTION PANEL
ALT: 222 U/L — ABNORMAL HIGH (ref 0–44)
AST: 51 U/L — ABNORMAL HIGH (ref 15–41)
Albumin: 2.6 g/dL — ABNORMAL LOW (ref 3.5–5.0)
Alkaline Phosphatase: 112 U/L (ref 38–126)
Bilirubin, Direct: 1.4 mg/dL — ABNORMAL HIGH (ref 0.0–0.2)
Indirect Bilirubin: 1.8 mg/dL — ABNORMAL HIGH (ref 0.3–0.9)
Total Bilirubin: 3.2 mg/dL — ABNORMAL HIGH (ref 0.3–1.2)
Total Protein: 5.2 g/dL — ABNORMAL LOW (ref 6.5–8.1)

## 2020-08-02 LAB — MAGNESIUM: Magnesium: 1.8 mg/dL (ref 1.7–2.4)

## 2020-08-02 MED ORDER — LISINOPRIL 20 MG PO TABS
20.0000 mg | ORAL_TABLET | Freq: Every day | ORAL | Status: DC
  Administered 2020-08-02 – 2020-08-04 (×3): 20 mg via ORAL
  Filled 2020-08-02 (×3): qty 1

## 2020-08-02 NOTE — Progress Notes (Addendum)
[facility] Gastroenterology Progress Note  Vincent Collins 79 y.o. 1941/08/31  CC:  Acute pancreatitis  Subjective: Patient reports feeling much better today.  Denies any abdominal pain, nausea, or vomiting.  Tolerated a tray of clear liquids.  Had a bowel movement this morning.  ROS : Review of Systems  Cardiovascular: Negative for chest pain and palpitations.  Gastrointestinal: Negative for abdominal pain, blood in stool, constipation, diarrhea, heartburn, melena, nausea and vomiting.   Objective: Vital signs in last 24 hours: Vitals:   08/02/20 0445 08/02/20 0739  BP: (!) 143/60 (!) 155/78  Pulse: (!) 59 77  Resp: 18 20  Temp: 97.9 F (36.6 C) 97.8 F (36.6 C)  SpO2: 96% 96%    Physical Exam:  General:  Alert, oriented, cooperative, no acute distress  Head:  Normocephalic, without obvious abnormality, atraumatic  Eyes:  Scleral icterus, EOM's intact,   Lungs:   Clear to auscultation bilaterally, respirations unlabored  Heart:  Mildly bradycardic with regular rhythm, S1, S2 normal  Abdomen:   Soft, mild periumbilical tenderness over hernia but no epigastric tenderness upon palpation, bowel sounds active all four quadrants  Extremities: Extremities normal, atraumatic, no  edema  Pulses: 2+ and symmetric    Lab Results: Recent Labs    08/01/20 0625 08/02/20 0537  NA 138 137  K 3.8 3.5  CL 104 106  CO2 25 22  GLUCOSE 121* 88  BUN 22 16  CREATININE 1.36* 1.10  CALCIUM 8.1* 8.2*  MG  --  1.8  PHOS  --  2.6   Recent Labs    08/01/20 0625 08/02/20 0537  AST 100* 51*  ALT 328* 222*  ALKPHOS 110 112  BILITOT 6.5* 3.2*  PROT 5.2* 5.2*  ALBUMIN 2.6* 2.6*  2.5*   Recent Labs    08/01/20 0625 08/02/20 0537  WBC 7.5 6.2  NEUTROABS  --  4.5  HGB 14.1 13.8  HCT 42.6 40.6  MCV 88.0 88.6  PLT 115* 128*   Recent Labs    07/31/20 0333  LABPROT 15.7*  INR 1.3*    Impression: Pancreatitis, improving. Suspect biliary origin.  Denies alcohol use. Normal  triglycerides (76 mg/dL) during last episode of pancreatitis in March. -Lipase decreased to 190, improved from 1,725 on admission -LFTs improving.  T. Bili 3.2/ AST 51/ ALT 222/ ALP 112 today as compared to T. Bili 6.5/ AST 100/ ALT 328/ ALP 110 yesterday -BUN 16/ Cr 1.10, improved from BUN 22/ Cr 1.36 yesterday  -MRCP 8/3: Consistent with acute pancreatitis. No findings for pancreatic necrosis. Normal caliber and course of the common bile duct. No common bile duct stones are identified but exam is limited by motion artifact.. Abnormal gallbladder with gallbladder wall thickening, edema and enhancement and some debris. No obvious or definite gallstones.  A fib, on Eliquis, last dose 8/2, now on Heparin  Plan: Decrease NS to 100cc/hr.  Surgical team is following for possible lap chole.  Continue to advance diet as tolerated (low fat diet).  [facility] will sign off. Please contact us if we can be of any further assistance during this hospital stay.  Edrick Kins PA-C 08/02/2020, 10:49 AM  Contact #  [phone]

## 2020-08-02 NOTE — Progress Notes (Signed)
PROGRESS NOTE    Vincent Collins  VFI:[id number] DOB: 1941/06/22 DOA: 07/31/2020 PCP: Dettinger, Elige Radon, MD   Brief Narrative:   Vincent Collins a 79 y.o.malewith medical history significant ofparoxysmal A. fib on Eliquis,COPD, DM 2, HTN, HLD,ureteral stones, diverticulitis,pancreatitis with unknown etiology in March 2021, presented with recurrent right RUQ abdominal pain. Symptoms started at night, cramping like 8-9/10, with fever of 101 at home and episode of chills, pain radiates to right shoulder associated with short of breath. Feeling nauseous but no vomiting.  8/5: BP up today. Resume home lisinopril. He is feeling better. Possible lap chole tomorrow. Continue IV zosyn for now. Renal function is improved. LFTs trending down.    Assessment & Plan: Acute recurrent pancreatitis Elevated LFTs/Hyperbilirubinemia Cholecystitis     - suspicion for gallstone/sludge associated     - MRCP: 1. MR findings consistent with acute pancreatitis. No findings for pancreatic necrosis. 2. Normal caliber and course of the common bile duct. No common bile duct stones are identified but exam is limited by motion artifact. 3. Abnormal gallbladder with gallbladder wall thickening, edema and enhancement and some debris. No obvious or definite gallstones. 4. Simple appearing hepatic and renal cysts.     - GI and surgery on board; appreciate assistance     - 8/5: continue zosyn; WBC better Afebrile. Ab pain improved. Possible chole tomorrow. Advanced to CLD.   AKI     - SCr at admission 1.71; baseline is 0.9 - 1     - started on IVFs     - 8/5: resolved  HLD     - holding home statin until resolution of elevated LFTs  Paroxysmal A. Fib     - home eliquis held d/t possible need for procedure; will resume if no procedure required     - heparin S/Q  DMt2     - last A1c (05/18/20): 6.8     - SSI, DM diet, follow  Thrombocytopenia     - plts 128 this AM; no evidence of bleed, monitor  HTN      - resume lisinopril  DVT prophylaxis: heparin Code Status: FULL Family Communication: None at bedside   Status is: Inpatient  Remains inpatient appropriate because:Inpatient level of care appropriate due to severity of illness   Dispo: The patient is from: Home              Anticipated d/c is to: Home              Anticipated d/c date is: 3 days              Patient currently is not medically stable to d/c.  Consultants:   GI  General surgery  Antimicrobials:  . zosyn   ROS:  Reports improved ab pain. Denies CP, N, V, dyspnea . Remainder ROS is negative for all not previously mentioned.  Subjective: "Thank y'all for what y'all have done for me."  Objective: Vitals:   08/01/20 1640 08/01/20 2012 08/02/20 0028 08/02/20 0445  BP: (!) 179/74 (!) 159/69 (!) 169/79 (!) 143/60  Pulse: 60 66 64 (!) 59  Resp: 20 18 18 18   Temp: 97.9 F (36.6 C) 98.5 F (36.9 C) 98.1 F (36.7 C) 97.9 F (36.6 C)  TempSrc: Oral Oral Oral Oral  SpO2: 94% 97% 94% 96%  Weight:    109.6 kg  Height:        Intake/Output Summary (Last 24 hours) at 08/02/2020 0720 Last data filed at 08/02/2020  2725 Gross per 24 hour  Intake 2792.84 ml  Output 1700 ml  Net 1092.84 ml   Filed Weights   07/31/20 0319 08/01/20 0007 08/02/20 0445  Weight: 122 kg 108.6 kg 109.6 kg    Examination:  General: 79 y.o. male resting in bed in NAD Cardiovascular: RRR, +S1, S2, no m/g/r, equal pulses throughout Respiratory: CTABL, no w/r/r, normal WOB GI: BS+, NDNT, no masses noted, no organomegaly noted MSK: No e/c/c Neuro: A&O x 3, no focal deficits Psyc: Appropriate interaction and affect, calm/cooperative  Data Reviewed: I have personally reviewed following labs and imaging studies.  CBC: Recent Labs  Lab 07/31/20 0333 08/01/20 0625 08/02/20 0537  WBC 12.3* 7.5 6.2  NEUTROABS  --   --  4.5  HGB 15.7 14.1 13.8  HCT 46.6 42.6 40.6  MCV 90.0 88.0 88.6  PLT 135* 115* 128*   Basic Metabolic  Panel: Recent Labs  Lab 07/31/20 0333 08/01/20 0625 08/02/20 0537  NA 133* 138 137  K 4.0 3.8 3.5  CL 98 104 106  CO2 24 25 22   GLUCOSE 186* 121* 88  BUN 23 22 16   CREATININE 1.71* 1.36* 1.10  CALCIUM 8.3* 8.1* 8.2*  MG  --   --  1.8  PHOS  --   --  2.6   GFR: Estimated Creatinine Clearance: 66.5 mL/min (by C-G formula based on SCr of 1.1 mg/dL). Liver Function Tests: Recent Labs  Lab 07/31/20 0838 08/01/20 0625 08/02/20 0537  AST 218* 100*  --   ALT 516* 328*  --   ALKPHOS 123 110  --   BILITOT 8.2* 6.5*  --   PROT 5.6* 5.2*  --   ALBUMIN 3.0* 2.6* 2.5*   Recent Labs  Lab 07/31/20 0838 08/01/20 0625  LIPASE 1,725* 754*   No results for input(s): AMMONIA in the last 168 hours. Coagulation Profile: Recent Labs  Lab 07/31/20 0333  INR 1.3*   Cardiac Enzymes: No results for input(s): CKTOTAL, CKMB, CKMBINDEX, TROPONINI in the last 168 hours. BNP (last 3 results) No results for input(s): PROBNP in the last 8760 hours. HbA1C: No results for input(s): HGBA1C in the last 72 hours. CBG: No results for input(s): GLUCAP in the last 168 hours. Lipid Profile: No results for input(s): CHOL, HDL, LDLCALC, TRIG, CHOLHDL, LDLDIRECT in the last 72 hours. Thyroid Function Tests: No results for input(s): TSH, T4TOTAL, FREET4, T3FREE, THYROIDAB in the last 72 hours. Anemia Panel: No results for input(s): VITAMINB12, FOLATE, FERRITIN, TIBC, IRON, RETICCTPCT in the last 72 hours. Sepsis Labs: No results for input(s): PROCALCITON, LATICACIDVEN in the last 168 hours.  Recent Results (from the past 240 hour(s))  SARS Coronavirus 2 by RT PCR (hospital order, performed in [facility] hospital lab) Nasopharyngeal Nasopharyngeal Swab     Status: None   Collection Time: 07/31/20  1:46 PM   Specimen: Nasopharyngeal Swab  Result Value Ref Range Status   SARS Coronavirus 2 NEGATIVE NEGATIVE Final    Comment: (NOTE) SARS-CoV-2 target nucleic acids are NOT DETECTED.  The  SARS-CoV-2 RNA is generally detectable in upper and lower respiratory specimens during the acute phase of infection. The lowest concentration of SARS-CoV-2 viral copies this assay can detect is 250 copies / mL. A negative result does not preclude SARS-CoV-2 infection and should not be used as the sole basis for treatment or other patient management decisions.  A negative result may occur with improper specimen collection / handling, submission of specimen other than nasopharyngeal swab, presence of viral  mutation(s) within the areas targeted by this assay, and inadequate number of viral copies (<250 copies / mL). A negative result must be combined with clinical observations, patient history, and epidemiological information.  Fact Sheet for Patients:   BoilerBrush.com.cyhttps://www.fda.gov/media/136312/download  Fact Sheet for Healthcare Providers: https://pope.com/https://www.fda.gov/media/136313/download  This test is not yet approved or  cleared by the [place] FDA and has been authorized for detection and/or diagnosis of SARS-CoV-2 by FDA under an Emergency Use Authorization (EUA).  This EUA will remain in effect (meaning this test can be used) for the duration of the COVID-19 declaration under Section 564(b)(1) of the Act, 21 U.S.C. section 360bbb-3(b)(1), unless the authorization is terminated or revoked sooner.  Performed at [facility], [address] [place], [place] [address]       Radiology Studies: CT ABDOMEN PELVIS W CONTRAST  Result Date: 07/31/2020 CLINICAL DATA:  Epigastric pain, tenderness to palpation EXAM: CT ABDOMEN AND PELVIS WITH CONTRAST TECHNIQUE: Multidetector CT imaging of the abdomen and pelvis was performed using the standard protocol following bolus administration of intravenous contrast. CONTRAST:  80mL OMNIPAQUE IOHEXOL 300 MG/ML  SOLN COMPARISON:  07/29/2020 FINDINGS: Lower chest: No acute abnormality. Scarring of the included bilateral lung bases. Hepatobiliary: No solid  liver abnormality is seen. No gallstones or biliary dilatation. Thickening of the gallbladder wall, new compared to prior examination. (Series 6, image 28). Pancreas: Mild inflammatory fat stranding about the inferior head of the pancreas adjacent to the duodenum as below. No pancreatic ductal dilatation or surrounding inflammatory changes. Spleen: Normal in size without significant abnormality. Adrenals/Urinary Tract: Adrenal glands are unremarkable. Kidneys are normal, without renal calculi, solid lesion, or hydronephrosis. Bladder is unremarkable. Stomach/Bowel: Stomach is within normal limits. There is new inflammatory fat stranding about the descending and transverse portions of the duodenum (series 6, image 43). Incidental diverticulum of the transverse duodenum. Appendix appears normal. No evidence of bowel wall thickening, distention, or inflammatory changes. Colonic diverticulosis. Vascular/Lymphatic: Aortic atherosclerosis. No enlarged abdominal or pelvic lymph nodes. Reproductive: Mild prostatomegaly. Other: Fat containing umbilical hernia.  No abdominopelvic ascites. Musculoskeletal: No acute or significant osseous findings. IMPRESSION: 1. There is new inflammatory fat stranding about the descending and transverse portions of the duodenum. Findings are consistent with either nonspecific infectious or inflammatory duodenitis or alternately groove pancreatitis. 2. Thickening of the gallbladder wall, new compared to prior examination, and of uncertain significance, possibly reactive to adjacent inflammation. 3. Overall constellation of findings could be related to a passed gallstone and gallstone pancreatitis. 4. Colonic diverticulosis without evidence for diverticulitis. 5. Mild prostatomegaly. 6. Aortic Atherosclerosis (ICD10-I70.0). Electronically Signed   By: Lauralyn PrimesAlex  Bibbey M.D.   On: 07/31/2020 11:57   MR 3D Recon At Scanner  Result Date: 07/31/2020 CLINICAL DATA:  Acute pancreatitis. EXAM: MRI  ABDOMEN WITHOUT AND WITH CONTRAST (INCLUDING MRCP) TECHNIQUE: Multiplanar multisequence MR imaging of the abdomen was performed both before and after the administration of intravenous contrast. Heavily T2-weighted images of the biliary and pancreatic ducts were obtained, and three-dimensional MRCP images were rendered by post processing. CONTRAST:  10mL GADAVIST GADOBUTROL 1 MMOL/ML IV SOLN COMPARISON:  CT scan 07/31/2020 FINDINGS: Lower chest: The lung bases are clear of an acute process. No pleural effusions. No pericardial effusion. Hepatobiliary: Small hepatic cysts are noted. No worrisome hepatic lesions. No intrahepatic biliary dilatation. Normal caliber and course of the common bile duct. No common bile duct stones are identified but exam is limited by motion artifact. The gallbladder is abnormal. There is gallbladder wall  thickening, gallbladder wall edema and some debris. No obvious or definite gallstones. Pancreas: MR findings consistent with acute pancreatitis. Diffuse inflammation/edema involving the pancreas and peripancreatic tissues. There is also moderate inflammation of the second and third portions of the duodenum. Normal pancreatic enhancement. No findings for pancreatic necrosis. Spleen:  Normal size.  No focal lesions. Adrenals/Urinary Tract: The adrenal glands and kidneys are unremarkable. Small renal cysts are noted. Stomach/Bowel: The stomach is unremarkable. Inflammation of the duodenum is secondary to pancreatitis. The visualized small bowel and colon are grossly normal. Vascular/Lymphatic: The aorta and branch vessels are patent. The major venous structures are patent. No retroperitoneal adenopathy. Other:  No ascites or abdominal wall hernia. Musculoskeletal: No significant bony findings. IMPRESSION: 1. MR findings consistent with acute pancreatitis. No findings for pancreatic necrosis. 2. Normal caliber and course of the common bile duct. No common bile duct stones are identified but  exam is limited by motion artifact. 3. Abnormal gallbladder with gallbladder wall thickening, edema and enhancement and some debris. No obvious or definite gallstones. 4. Simple appearing hepatic and renal cysts. Electronically Signed   By: Rudie Meyer M.D.   On: 07/31/2020 18:40   MR ABDOMEN MRCP W WO CONTAST  Result Date: 07/31/2020 CLINICAL DATA:  Acute pancreatitis. EXAM: MRI ABDOMEN WITHOUT AND WITH CONTRAST (INCLUDING MRCP) TECHNIQUE: Multiplanar multisequence MR imaging of the abdomen was performed both before and after the administration of intravenous contrast. Heavily T2-weighted images of the biliary and pancreatic ducts were obtained, and three-dimensional MRCP images were rendered by post processing. CONTRAST:  10mL GADAVIST GADOBUTROL 1 MMOL/ML IV SOLN COMPARISON:  CT scan 07/31/2020 FINDINGS: Lower chest: The lung bases are clear of an acute process. No pleural effusions. No pericardial effusion. Hepatobiliary: Small hepatic cysts are noted. No worrisome hepatic lesions. No intrahepatic biliary dilatation. Normal caliber and course of the common bile duct. No common bile duct stones are identified but exam is limited by motion artifact. The gallbladder is abnormal. There is gallbladder wall thickening, gallbladder wall edema and some debris. No obvious or definite gallstones. Pancreas: MR findings consistent with acute pancreatitis. Diffuse inflammation/edema involving the pancreas and peripancreatic tissues. There is also moderate inflammation of the second and third portions of the duodenum. Normal pancreatic enhancement. No findings for pancreatic necrosis. Spleen:  Normal size.  No focal lesions. Adrenals/Urinary Tract: The adrenal glands and kidneys are unremarkable. Small renal cysts are noted. Stomach/Bowel: The stomach is unremarkable. Inflammation of the duodenum is secondary to pancreatitis. The visualized small bowel and colon are grossly normal. Vascular/Lymphatic: The aorta and  branch vessels are patent. The major venous structures are patent. No retroperitoneal adenopathy. Other:  No ascites or abdominal wall hernia. Musculoskeletal: No significant bony findings. IMPRESSION: 1. MR findings consistent with acute pancreatitis. No findings for pancreatic necrosis. 2. Normal caliber and course of the common bile duct. No common bile duct stones are identified but exam is limited by motion artifact. 3. Abnormal gallbladder with gallbladder wall thickening, edema and enhancement and some debris. No obvious or definite gallstones. 4. Simple appearing hepatic and renal cysts. Electronically Signed   By: Rudie Meyer M.D.   On: 07/31/2020 18:40     Scheduled Meds: . cholecalciferol  1,000 Units Oral Daily  . heparin  5,000 Units Subcutaneous Q12H  . metoprolol tartrate  25 mg Oral BID  . pantoprazole  40 mg Oral Daily  . sodium chloride flush  3 mL Intravenous Once  . tamsulosin  0.8 mg Oral QPC supper  Continuous Infusions: . sodium chloride 150 mL/hr at 08/02/20 0029  . piperacillin-tazobactam (ZOSYN)  IV 3.375 g (08/02/20 0521)     LOS: 2 days    Time spent: 25 minutes spent in the coordination of care today.   Teddy Spike, DO [facility]  If 7PM-7AM, please contact night-coverage [email or web address] 08/02/2020, 7:20 AM

## 2020-08-02 NOTE — Progress Notes (Signed)
[facility] Progress Note     Subjective: CC:  abd pain improving. Reports a loose, non-bloody BM this morning. Denies fever, chills, nausea, emesis. Voiding without sxs. Requesting something to drink.  Objective: Vital signs in last 24 hours: Temp:  [97.7 F (36.5 C)-98.5 F (36.9 C)] 97.8 F (36.6 C) (08/05 0739) Pulse Rate:  [57-77] 77 (08/05 0739) Resp:  [18-20] 20 (08/05 0739) BP: (126-179)/(60-103) 155/78 (08/05 0739) SpO2:  [94 %-97 %] 96 % (08/05 0739) Weight:  [109.6 kg] 109.6 kg (08/05 0445) Last BM Date: 08/01/20  Intake/Output from previous day: 08/04 0701 - 08/05 0700 In: 2792.8 [P.O.:480; I.V.:2181.2; IV Piggyback:131.7] Out: 1150 [Urine:1150] Intake/Output this shift: Total I/O In: -  Out: 550 [Urine:550]  PE: Gen:  Alert, NAD, pleasant Card:  Regular rate and rhythm, pedal pulses 2+ BL Pulm:  Normal effort, clear to auscultation bilaterally Abd: Soft, obese, protuberant, soft reducible umbilical hernia this is mildly tender, mild TTP RUQ without rebound or guarding. No peritonitis.  Skin: warm and dry, no rashes  Psych: A&Ox3   Lab Results:  Recent Labs    08/01/20 0625 08/02/20 0537  WBC 7.5 6.2  HGB 14.1 13.8  HCT 42.6 40.6  PLT 115* 128*   BMET Recent Labs    08/01/20 0625 08/02/20 0537  NA 138 137  K 3.8 3.5  CL 104 106  CO2 25 22  GLUCOSE 121* 88  BUN 22 16  CREATININE 1.36* 1.10  CALCIUM 8.1* 8.2*   PT/INR Recent Labs    07/31/20 0333  LABPROT 15.7*  INR 1.3*   CMP     Component Value Date/Time   NA 137 08/02/2020 0537   NA 139 04/13/2020 1423   K 3.5 08/02/2020 0537   CL 106 08/02/2020 0537   CO2 22 08/02/2020 0537   GLUCOSE 88 08/02/2020 0537   BUN 16 08/02/2020 0537   BUN 12 04/13/2020 1423   CREATININE 1.10 08/02/2020 0537   CALCIUM 8.2 (L) 08/02/2020 0537   PROT 5.2 (L) 08/01/2020 0625   PROT 5.9 (L) 04/13/2020 1423   ALBUMIN 2.5 (L) 08/02/2020 0537   ALBUMIN 4.0 04/13/2020 1423   AST 100 (H)  08/01/2020 0625   ALT 328 (H) 08/01/2020 0625   ALKPHOS 110 08/01/2020 0625   BILITOT 6.5 (H) 08/01/2020 0625   BILITOT 1.0 04/13/2020 1423   GFRNONAA >60 08/02/2020 0537   GFRAA >60 08/02/2020 0537   Lipase     Component Value Date/Time   LIPASE 754 (H) 08/01/2020 0625       Studies/Results: CT ABDOMEN PELVIS W CONTRAST  Result Date: 07/31/2020 CLINICAL DATA:  Epigastric pain, tenderness to palpation EXAM: CT ABDOMEN AND PELVIS WITH CONTRAST TECHNIQUE: Multidetector CT imaging of the abdomen and pelvis was performed using the standard protocol following bolus administration of intravenous contrast. CONTRAST:  65mL OMNIPAQUE IOHEXOL 300 MG/ML  SOLN COMPARISON:  07/29/2020 FINDINGS: Lower chest: No acute abnormality. Scarring of the included bilateral lung bases. Hepatobiliary: No solid liver abnormality is seen. No gallstones or biliary dilatation. Thickening of the gallbladder wall, new compared to prior examination. (Series 6, image 28). Pancreas: Mild inflammatory fat stranding about the inferior head of the pancreas adjacent to the duodenum as below. No pancreatic ductal dilatation or surrounding inflammatory changes. Spleen: Normal in size without significant abnormality. Adrenals/Urinary Tract: Adrenal glands are unremarkable. Kidneys are normal, without renal calculi, solid lesion, or hydronephrosis. Bladder is unremarkable. Stomach/Bowel: Stomach is within normal limits. There is new inflammatory fat stranding  about the descending and transverse portions of the duodenum (series 6, image 43). Incidental diverticulum of the transverse duodenum. Appendix appears normal. No evidence of bowel wall thickening, distention, or inflammatory changes. Colonic diverticulosis. Vascular/Lymphatic: Aortic atherosclerosis. No enlarged abdominal or pelvic lymph nodes. Reproductive: Mild prostatomegaly. Other: Fat containing umbilical hernia.  No abdominopelvic ascites. Musculoskeletal: No acute or  significant osseous findings. IMPRESSION: 1. There is new inflammatory fat stranding about the descending and transverse portions of the duodenum. Findings are consistent with either nonspecific infectious or inflammatory duodenitis or alternately groove pancreatitis. 2. Thickening of the gallbladder wall, new compared to prior examination, and of uncertain significance, possibly reactive to adjacent inflammation. 3. Overall constellation of findings could be related to a passed gallstone and gallstone pancreatitis. 4. Colonic diverticulosis without evidence for diverticulitis. 5. Mild prostatomegaly. 6. Aortic Atherosclerosis (ICD10-I70.0). Electronically Signed   By: Lauralyn Primes M.D.   On: 07/31/2020 11:57   MR 3D Recon At Scanner  Result Date: 07/31/2020 CLINICAL DATA:  Acute pancreatitis. EXAM: MRI ABDOMEN WITHOUT AND WITH CONTRAST (INCLUDING MRCP) TECHNIQUE: Multiplanar multisequence MR imaging of the abdomen was performed both before and after the administration of intravenous contrast. Heavily T2-weighted images of the biliary and pancreatic ducts were obtained, and three-dimensional MRCP images were rendered by post processing. CONTRAST:  77mL GADAVIST GADOBUTROL 1 MMOL/ML IV SOLN COMPARISON:  CT scan 07/31/2020 FINDINGS: Lower chest: The lung bases are clear of an acute process. No pleural effusions. No pericardial effusion. Hepatobiliary: Small hepatic cysts are noted. No worrisome hepatic lesions. No intrahepatic biliary dilatation. Normal caliber and course of the common bile duct. No common bile duct stones are identified but exam is limited by motion artifact. The gallbladder is abnormal. There is gallbladder wall thickening, gallbladder wall edema and some debris. No obvious or definite gallstones. Pancreas: MR findings consistent with acute pancreatitis. Diffuse inflammation/edema involving the pancreas and peripancreatic tissues. There is also moderate inflammation of the second and third  portions of the duodenum. Normal pancreatic enhancement. No findings for pancreatic necrosis. Spleen:  Normal size.  No focal lesions. Adrenals/Urinary Tract: The adrenal glands and kidneys are unremarkable. Small renal cysts are noted. Stomach/Bowel: The stomach is unremarkable. Inflammation of the duodenum is secondary to pancreatitis. The visualized small bowel and colon are grossly normal. Vascular/Lymphatic: The aorta and branch vessels are patent. The major venous structures are patent. No retroperitoneal adenopathy. Other:  No ascites or abdominal wall hernia. Musculoskeletal: No significant bony findings. IMPRESSION: 1. MR findings consistent with acute pancreatitis. No findings for pancreatic necrosis. 2. Normal caliber and course of the common bile duct. No common bile duct stones are identified but exam is limited by motion artifact. 3. Abnormal gallbladder with gallbladder wall thickening, edema and enhancement and some debris. No obvious or definite gallstones. 4. Simple appearing hepatic and renal cysts. Electronically Signed   By: Rudie Meyer M.D.   On: 07/31/2020 18:40   MR ABDOMEN MRCP W WO CONTAST  Result Date: 07/31/2020 CLINICAL DATA:  Acute pancreatitis. EXAM: MRI ABDOMEN WITHOUT AND WITH CONTRAST (INCLUDING MRCP) TECHNIQUE: Multiplanar multisequence MR imaging of the abdomen was performed both before and after the administration of intravenous contrast. Heavily T2-weighted images of the biliary and pancreatic ducts were obtained, and three-dimensional MRCP images were rendered by post processing. CONTRAST:  22mL GADAVIST GADOBUTROL 1 MMOL/ML IV SOLN COMPARISON:  CT scan 07/31/2020 FINDINGS: Lower chest: The lung bases are clear of an acute process. No pleural effusions. No pericardial effusion. Hepatobiliary: Small hepatic cysts  are noted. No worrisome hepatic lesions. No intrahepatic biliary dilatation. Normal caliber and course of the common bile duct. No common bile duct stones are  identified but exam is limited by motion artifact. The gallbladder is abnormal. There is gallbladder wall thickening, gallbladder wall edema and some debris. No obvious or definite gallstones. Pancreas: MR findings consistent with acute pancreatitis. Diffuse inflammation/edema involving the pancreas and peripancreatic tissues. There is also moderate inflammation of the second and third portions of the duodenum. Normal pancreatic enhancement. No findings for pancreatic necrosis. Spleen:  Normal size.  No focal lesions. Adrenals/Urinary Tract: The adrenal glands and kidneys are unremarkable. Small renal cysts are noted. Stomach/Bowel: The stomach is unremarkable. Inflammation of the duodenum is secondary to pancreatitis. The visualized small bowel and colon are grossly normal. Vascular/Lymphatic: The aorta and branch vessels are patent. The major venous structures are patent. No retroperitoneal adenopathy. Other:  No ascites or abdominal wall hernia. Musculoskeletal: No significant bony findings. IMPRESSION: 1. MR findings consistent with acute pancreatitis. No findings for pancreatic necrosis. 2. Normal caliber and course of the common bile duct. No common bile duct stones are identified but exam is limited by motion artifact. 3. Abnormal gallbladder with gallbladder wall thickening, edema and enhancement and some debris. No obvious or definite gallstones. 4. Simple appearing hepatic and renal cysts. Electronically Signed   By: Rudie Meyer M.D.   On: 07/31/2020 18:40    Anti-infectives: Anti-infectives (From admission, onward)   Start     Dose/Rate Route Frequency Ordered Stop   07/31/20 1430  piperacillin-tazobactam (ZOSYN) IVPB 3.375 g     Discontinue     3.375 g 12.5 mL/hr over 240 Minutes Intravenous Every 8 hours 07/31/20 1427         Assessment/Plan A. Fib on Eliquis (last dose 8/2) HTN HLD DM2 COPD (not on home o2)   Recurrent Pancreatitis Possible Acalculous Cholecystitis -  Triglycerides 3/21 were 76. No alcohol use since the age of 88.  - MRCP 8/4 shows no evidence of choledocholithiasis or cholelithiasis.  There was some gallbladder wall thickening, gallbladder wall edema and debris. - WBC normalized. LFT's improving. T. Bili 8.2 > 6.5 yesterday; LFTs today pending. - Lipase 1725 > 754 yesterday - Before considering Laparoscopic Cholecystectomy, patients pancreatitis will need to resolve. We will continue following.  - clinically improving with less abdominal tenderness, improving labs, allow CLD today  FEN - CLD , IVF VTE - SCDs, please hold Eliquis, on subq heparin  ID - On Zosyn for possible Acalculous Cholecystitis   LOS: 2 days    Hosie Spangle, [facility] Please see Amion for pager number during day hours 7:00am-4:30pm

## 2020-08-03 ENCOUNTER — Inpatient Hospital Stay (HOSPITAL_COMMUNITY): Payer: Medicare Other | Admitting: Anesthesiology

## 2020-08-03 ENCOUNTER — Inpatient Hospital Stay (HOSPITAL_COMMUNITY): Payer: Medicare Other

## 2020-08-03 ENCOUNTER — Encounter (HOSPITAL_COMMUNITY)
Admission: EM | Disposition: A | Discharge status: Home / Self Care | Payer: Self-pay | Source: Home / Self Care | Attending: Internal Medicine

## 2020-08-03 ENCOUNTER — Encounter (HOSPITAL_COMMUNITY): Payer: Self-pay | Admitting: Internal Medicine

## 2020-08-03 HISTORY — PX: CHOLECYSTECTOMY: SHX55

## 2020-08-03 LAB — CBC WITH DIFFERENTIAL/PLATELET
Abs Immature Granulocytes: 0.11 10*3/uL — ABNORMAL HIGH (ref 0.00–0.07)
Basophils Absolute: 0 10*3/uL (ref 0.0–0.1)
Basophils Relative: 1 %
Eosinophils Absolute: 0.1 10*3/uL (ref 0.0–0.5)
Eosinophils Relative: 1 %
HCT: 43.8 % (ref 39.0–52.0)
Hemoglobin: 14.6 g/dL (ref 13.0–17.0)
Immature Granulocytes: 2 %
Lymphocytes Relative: 6 %
Lymphs Abs: 0.4 10*3/uL — ABNORMAL LOW (ref 0.7–4.0)
MCH: 29.3 pg (ref 26.0–34.0)
MCHC: 33.3 g/dL (ref 30.0–36.0)
MCV: 88 fL (ref 80.0–100.0)
Monocytes Absolute: 0.3 10*3/uL (ref 0.1–1.0)
Monocytes Relative: 5 %
Neutro Abs: 5.2 10*3/uL (ref 1.7–7.7)
Neutrophils Relative %: 85 %
Platelets: 142 10*3/uL — ABNORMAL LOW (ref 150–400)
RBC: 4.98 MIL/uL (ref 4.22–5.81)
RDW: 13.9 % (ref 11.5–15.5)
WBC: 6.1 10*3/uL (ref 4.0–10.5)
nRBC: 0 % (ref 0.0–0.2)

## 2020-08-03 LAB — RENAL FUNCTION PANEL
Albumin: 2.7 g/dL — ABNORMAL LOW (ref 3.5–5.0)
Anion gap: 10 (ref 5–15)
BUN: 6 mg/dL — ABNORMAL LOW (ref 8–23)
CO2: 27 mmol/L (ref 22–32)
Calcium: 8.7 mg/dL — ABNORMAL LOW (ref 8.9–10.3)
Chloride: 105 mmol/L (ref 98–111)
Creatinine, Ser: 1.1 mg/dL (ref 0.61–1.24)
GFR calc Af Amer: >60 mL/min (ref >60)
GFR calc non Af Amer: >60 mL/min (ref >60)
Glucose, Bld: 167 mg/dL — ABNORMAL HIGH (ref 70–99)
Phosphorus: 2.6 mg/dL (ref 2.5–4.6)
Potassium: 3.9 mmol/L (ref 3.5–5.1)
Sodium: 142 mmol/L (ref 135–145)

## 2020-08-03 LAB — GLUCOSE, CAPILLARY
Glucose-Capillary: 133 mg/dL — ABNORMAL HIGH (ref 70–99)
Glucose-Capillary: 169 mg/dL — ABNORMAL HIGH (ref 70–99)

## 2020-08-03 LAB — SURGICAL PCR SCREEN
MRSA, PCR: NEGATIVE
Staphylococcus aureus: NEGATIVE

## 2020-08-03 IMAGING — RF DG CHOLANGIOGRAM OPERATIVE
1 series · 4 of 4 positions shown · non-contrast
Comparison: None.

CLINICAL DATA: 79-year-old male with cholelithiasis

EXAM:
INTRAOPERATIVE CHOLANGIOGRAM
TECHNIQUE: Cholangiographic images from the C-arm fluoroscopic device were
submitted for interpretation post-operatively. Please see the
procedural report for the amount of contrast and the fluoroscopy
time utilized.

[Series 1: run · 4 of 51 frames shown]
[frame 3/51]
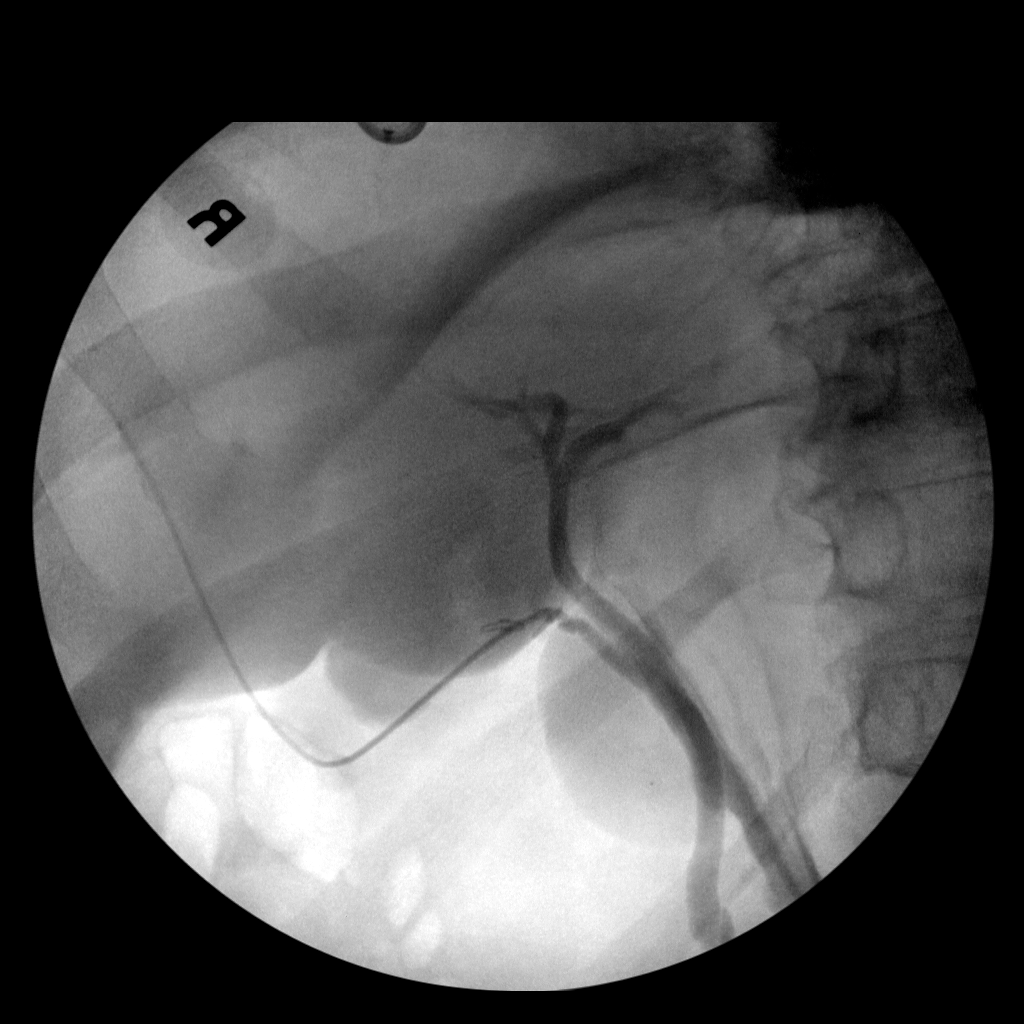
[frame 8/51]
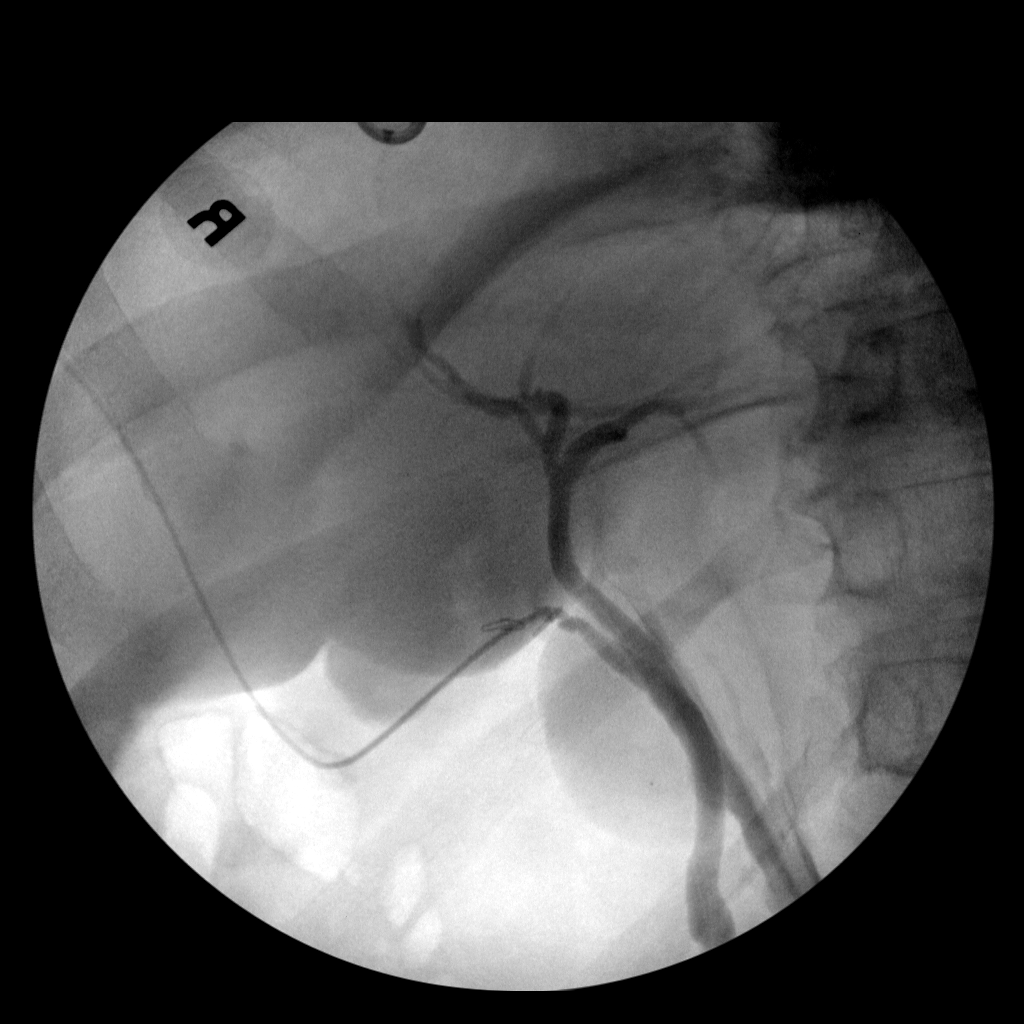
[frame 26/51]
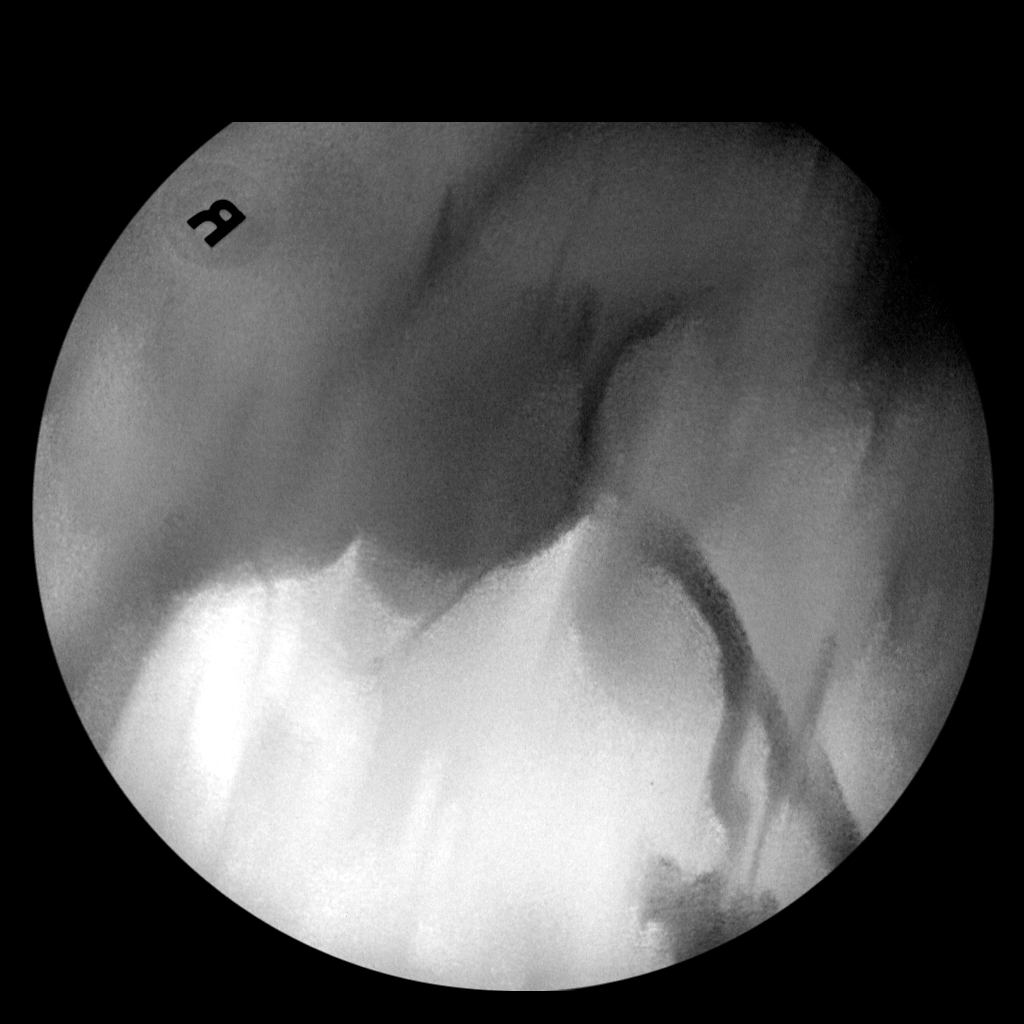
[frame 44/51]
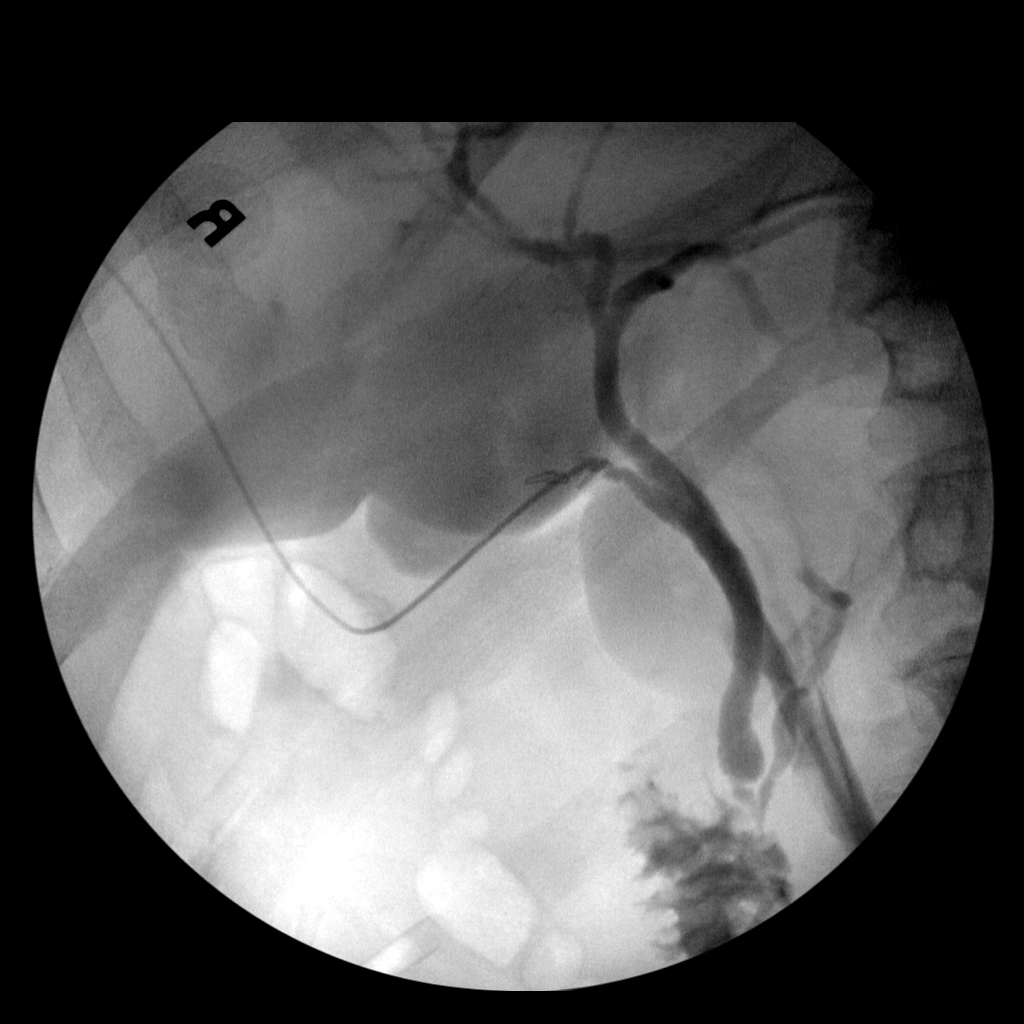

[4 of 4 positions shown; findings below may reference images not displayed]

FINDINGS: Surgical instruments project over the upper abdomen.

There is cannulation of the cystic duct/gallbladder neck, with
antegrade infusion of contrast. Caliber of the extrahepatic ductal
system within normal limits.

No definite filling defect within the extrahepatic ducts identified.

Free flow of contrast across the ampulla.
IMPRESSION: Intraoperative cholangiogram demonstrates extrahepatic biliary ducts
of unremarkable caliber, with no definite filling defects
identified. Free flow of contrast across the ampulla.

Please refer to the dictated operative report for full details of
intraoperative findings and procedure

## 2020-08-03 SURGERY — LAPAROSCOPIC CHOLECYSTECTOMY WITH INTRAOPERATIVE CHOLANGIOGRAM
Anesthesia: General | Site: Abdomen

## 2020-08-03 MED ORDER — ONDANSETRON HCL 4 MG/2ML IJ SOLN: 4.0000 mg | Freq: Once | INTRAMUSCULAR | Status: DC | PRN

## 2020-08-03 MED ORDER — ROCURONIUM BROMIDE 10 MG/ML (PF) SYRINGE
PREFILLED_SYRINGE | INTRAVENOUS | Status: DC | PRN
  Administered 2020-08-03: 50 mg via INTRAVENOUS
  Administered 2020-08-03: 30 mg via INTRAVENOUS

## 2020-08-03 MED ORDER — SODIUM CHLORIDE 0.9 % IR SOLN
Status: DC | PRN
  Administered 2020-08-03: 1000 mL

## 2020-08-03 MED ORDER — PROPOFOL 10 MG/ML IV BOLUS
INTRAVENOUS | Status: DC | PRN
  Administered 2020-08-03: 120 mg via INTRAVENOUS

## 2020-08-03 MED ORDER — SUGAMMADEX SODIUM 200 MG/2ML IV SOLN
INTRAVENOUS | Status: DC | PRN
  Administered 2020-08-03: 200 mg via INTRAVENOUS

## 2020-08-03 MED ORDER — ONDANSETRON HCL 4 MG/2ML IJ SOLN
INTRAMUSCULAR | Status: DC | PRN
  Administered 2020-08-03: 4 mg via INTRAVENOUS

## 2020-08-03 MED ORDER — SCOPOLAMINE 1 MG/3DAYS TD PT72
1.0000 | MEDICATED_PATCH | TRANSDERMAL | Status: DC
  Administered 2020-08-03: 1.5 mg via TRANSDERMAL
  Filled 2020-08-03: qty 1

## 2020-08-03 MED ORDER — KETOROLAC TROMETHAMINE 15 MG/ML IJ SOLN
INTRAMUSCULAR | Status: DC | PRN
  Administered 2020-08-03: 15 mg via INTRAVENOUS

## 2020-08-03 MED ORDER — HEPARIN SODIUM (PORCINE) 5000 UNIT/ML IJ SOLN
5000.0000 [IU] | Freq: Three times a day (TID) | INTRAMUSCULAR | Status: DC
  Administered 2020-08-04: 5000 [IU] via SUBCUTANEOUS
  Filled 2020-08-03: qty 1

## 2020-08-03 MED ORDER — BUPIVACAINE HCL (PF) 0.25 % IJ SOLN
INTRAMUSCULAR | Status: DC | PRN
  Administered 2020-08-03: 20 mL

## 2020-08-03 MED ORDER — BUPIVACAINE HCL (PF) 0.25 % IJ SOLN
INTRAMUSCULAR | Status: AC
  Filled 2020-08-03: qty 30

## 2020-08-03 MED ORDER — SODIUM CHLORIDE 0.9 % IV SOLN
INTRAVENOUS | Status: DC | PRN
  Administered 2020-08-03: 15 mL

## 2020-08-03 MED ORDER — PROPOFOL 10 MG/ML IV BOLUS
INTRAVENOUS | Status: AC
  Filled 2020-08-03: qty 20

## 2020-08-03 MED ORDER — DEXAMETHASONE SODIUM PHOSPHATE 10 MG/ML IJ SOLN
INTRAMUSCULAR | Status: DC | PRN
  Administered 2020-08-03: 5 mg via INTRAVENOUS

## 2020-08-03 MED ORDER — MIDAZOLAM HCL 2 MG/2ML IJ SOLN
INTRAMUSCULAR | Status: AC
  Filled 2020-08-03: qty 2

## 2020-08-03 MED ORDER — CHLORHEXIDINE GLUCONATE 0.12 % MT SOLN
15.0000 mL | OROMUCOSAL | Status: AC
  Administered 2020-08-03: 15 mL via OROMUCOSAL
  Filled 2020-08-03 (×2): qty 15

## 2020-08-03 MED ORDER — FENTANYL CITRATE (PF) 100 MCG/2ML IJ SOLN: 25.0000 ug | INTRAMUSCULAR | Status: DC | PRN

## 2020-08-03 MED ORDER — DEXAMETHASONE SODIUM PHOSPHATE 10 MG/ML IJ SOLN
INTRAMUSCULAR | Status: AC
  Filled 2020-08-03: qty 1

## 2020-08-03 MED ORDER — ROCURONIUM BROMIDE 10 MG/ML (PF) SYRINGE
PREFILLED_SYRINGE | INTRAVENOUS | Status: AC
  Filled 2020-08-03: qty 10

## 2020-08-03 MED ORDER — OXYCODONE HCL 5 MG PO TABS: 5.0000 mg | ORAL_TABLET | ORAL | Status: DC | PRN

## 2020-08-03 MED ORDER — ONDANSETRON HCL 4 MG/2ML IJ SOLN
INTRAMUSCULAR | Status: AC
  Filled 2020-08-03: qty 2

## 2020-08-03 MED ORDER — LIDOCAINE 2% (20 MG/ML) 5 ML SYRINGE
INTRAMUSCULAR | Status: AC
  Filled 2020-08-03: qty 5

## 2020-08-03 MED ORDER — MIDAZOLAM HCL 2 MG/2ML IJ SOLN
INTRAMUSCULAR | Status: DC | PRN
  Administered 2020-08-03: 2 mg via INTRAVENOUS

## 2020-08-03 MED ORDER — 0.9 % SODIUM CHLORIDE (POUR BTL) OPTIME
TOPICAL | Status: DC | PRN
  Administered 2020-08-03: 1000 mL

## 2020-08-03 MED ORDER — KETOROLAC TROMETHAMINE 30 MG/ML IJ SOLN
INTRAMUSCULAR | Status: AC
  Filled 2020-08-03: qty 1

## 2020-08-03 MED ORDER — FENTANYL CITRATE (PF) 250 MCG/5ML IJ SOLN
INTRAMUSCULAR | Status: DC | PRN
  Administered 2020-08-03: 100 ug via INTRAVENOUS

## 2020-08-03 MED ORDER — ACETAMINOPHEN 500 MG PO TABS
1000.0000 mg | ORAL_TABLET | ORAL | Status: AC
  Administered 2020-08-03: 1000 mg via ORAL
  Filled 2020-08-03: qty 2

## 2020-08-03 MED ORDER — HYDROMORPHONE HCL 1 MG/ML IJ SOLN: 0.5000 mg | INTRAMUSCULAR | Status: DC | PRN

## 2020-08-03 MED ORDER — LACTATED RINGERS IV SOLN: INTRAVENOUS | Status: DC | PRN

## 2020-08-03 MED ORDER — FENTANYL CITRATE (PF) 250 MCG/5ML IJ SOLN
INTRAMUSCULAR | Status: AC
  Filled 2020-08-03: qty 5

## 2020-08-03 MED ORDER — GABAPENTIN 300 MG PO CAPS
300.0000 mg | ORAL_CAPSULE | ORAL | Status: AC
  Administered 2020-08-03: 300 mg via ORAL
  Filled 2020-08-03: qty 1

## 2020-08-03 MED ORDER — LACTATED RINGERS IV SOLN: INTRAVENOUS | Status: DC

## 2020-08-03 MED ORDER — LIDOCAINE 2% (20 MG/ML) 5 ML SYRINGE
INTRAMUSCULAR | Status: DC | PRN
  Administered 2020-08-03: 100 mg via INTRAVENOUS

## 2020-08-03 MED ORDER — PHENYLEPHRINE 40 MCG/ML (10ML) SYRINGE FOR IV PUSH (FOR BLOOD PRESSURE SUPPORT)
PREFILLED_SYRINGE | INTRAVENOUS | Status: DC | PRN
  Administered 2020-08-03: 120 ug via INTRAVENOUS
  Administered 2020-08-03: 80 ug via INTRAVENOUS

## 2020-08-03 SURGICAL SUPPLY — 55 items
APL PRP STRL LF DISP 70% ISPRP (MISCELLANEOUS) ×1
APL SKNCLS STERI-STRIP NONHPOA (GAUZE/BANDAGES/DRESSINGS) ×1
APPLIER CLIP 5 13 M/L LIGAMAX5 (MISCELLANEOUS) ×3
APR CLP MED LRG 5 ANG JAW (MISCELLANEOUS) ×1
BAG SPEC RTRVL 10 TROC 200 (ENDOMECHANICALS) ×1
BENZOIN TINCTURE PRP APPL 2/3 (GAUZE/BANDAGES/DRESSINGS) ×3 IMPLANT
BLADE CLIPPER SURG (BLADE) ×2 IMPLANT
BNDG ADH 1X3 SHEER STRL LF (GAUZE/BANDAGES/DRESSINGS) ×9 IMPLANT
BNDG ADH THN 3X1 STRL LF (GAUZE/BANDAGES/DRESSINGS) ×3
CANISTER SUCT 3000ML PPV (MISCELLANEOUS) ×3 IMPLANT
CHLORAPREP W/TINT 26 (MISCELLANEOUS) ×3 IMPLANT
CLIP APPLIE 5 13 M/L LIGAMAX5 (MISCELLANEOUS) ×1 IMPLANT
CLOSURE STERI-STRIP 1/2X4 (GAUZE/BANDAGES/DRESSINGS) ×1
CLOSURE WOUND 1/2 X4 (GAUZE/BANDAGES/DRESSINGS) ×1
CLSR STERI-STRIP ANTIMIC 1/2X4 (GAUZE/BANDAGES/DRESSINGS) ×2 IMPLANT
COVER MAYO STAND STRL (DRAPES) ×3 IMPLANT
COVER SURGICAL LIGHT HANDLE (MISCELLANEOUS) ×3 IMPLANT
COVER WAND RF STERILE (DRAPES) ×3 IMPLANT
DRAPE C-ARM 42X120 X-RAY (DRAPES) ×3 IMPLANT
DRSG TEGADERM 2-3/8X2-3/4 SM (GAUZE/BANDAGES/DRESSINGS) ×3 IMPLANT
DRSG TEGADERM 4X4.75 (GAUZE/BANDAGES/DRESSINGS) ×3 IMPLANT
ELECT REM PT RETURN 9FT ADLT (ELECTROSURGICAL) ×3
ELECTRODE REM PT RTRN 9FT ADLT (ELECTROSURGICAL) ×1 IMPLANT
GAUZE SPONGE 2X2 8PLY STRL LF (GAUZE/BANDAGES/DRESSINGS) ×1 IMPLANT
GLOVE BIOGEL M STRL SZ7.5 (GLOVE) ×3 IMPLANT
GLOVE INDICATOR 8.0 STRL GRN (GLOVE) ×6 IMPLANT
GOWN STRL REUS W/ TWL LRG LVL3 (GOWN DISPOSABLE) ×3 IMPLANT
GOWN STRL REUS W/TWL 2XL LVL3 (GOWN DISPOSABLE) ×3 IMPLANT
GOWN STRL REUS W/TWL LRG LVL3 (GOWN DISPOSABLE) ×9
GRASPER SUT TROCAR 14GX15 (MISCELLANEOUS) ×2 IMPLANT
KIT BASIN OR (CUSTOM PROCEDURE TRAY) ×3 IMPLANT
KIT TURNOVER KIT B (KITS) ×3 IMPLANT
NS IRRIG 1000ML POUR BTL (IV SOLUTION) ×3 IMPLANT
PAD ARMBOARD 7.5X6 YLW CONV (MISCELLANEOUS) ×3 IMPLANT
POUCH RETRIEVAL ECOSAC 10 (ENDOMECHANICALS) ×1 IMPLANT
POUCH RETRIEVAL ECOSAC 10MM (ENDOMECHANICALS) ×3
SCISSORS LAP 5X35 DISP (ENDOMECHANICALS) ×3 IMPLANT
SET CHOLANGIOGRAPH 5 50 .035 (SET/KITS/TRAYS/PACK) ×3 IMPLANT
SET IRRIG TUBING LAPAROSCOPIC (IRRIGATION / IRRIGATOR) ×3 IMPLANT
SET TUBE SMOKE EVAC HIGH FLOW (TUBING) ×3 IMPLANT
SLEEVE ENDOPATH XCEL 5M (ENDOMECHANICALS) ×6 IMPLANT
SPECIMEN JAR SMALL (MISCELLANEOUS) ×3 IMPLANT
SPONGE GAUZE 2X2 8PLY STER LF (GAUZE/BANDAGES/DRESSINGS) ×1
SPONGE GAUZE 2X2 8PLY STRL LF (GAUZE/BANDAGES/DRESSINGS) ×1 IMPLANT
SPONGE GAUZE 2X2 STER 10/PKG (GAUZE/BANDAGES/DRESSINGS) ×2
STRIP CLOSURE SKIN 1/2X4 (GAUZE/BANDAGES/DRESSINGS) ×2 IMPLANT
SUT MNCRL AB 4-0 PS2 18 (SUTURE) ×3 IMPLANT
SUT VIC AB 0 UR5 27 (SUTURE) ×2 IMPLANT
SUT VICRYL 0 UR6 27IN ABS (SUTURE) ×2 IMPLANT
TOWEL GREEN STERILE (TOWEL DISPOSABLE) ×3 IMPLANT
TOWEL GREEN STERILE FF (TOWEL DISPOSABLE) ×3 IMPLANT
TRAY LAPAROSCOPIC MC (CUSTOM PROCEDURE TRAY) ×3 IMPLANT
TROCAR XCEL BLUNT TIP 100MML (ENDOMECHANICALS) ×3 IMPLANT
TROCAR XCEL NON-BLD 5MMX100MML (ENDOMECHANICALS) ×3 IMPLANT
WATER STERILE IRR 1000ML POUR (IV SOLUTION) ×3 IMPLANT

## 2020-08-03 NOTE — Op Note (Signed)
Vincent Collins [id number] [birth year] 08/03/2020  Laparoscopic Cholecystectomy with IOC Procedure Note  Indications: This patient presents with symptomatic gallbladder disease and will undergo laparoscopic cholecystectomy.  He had been admitted with pancreatitis and elevated LFTs.  His pancreatitis had improved.  None of his imaging demonstrated definitive cholelithiasis however he did have sludge on imaging.  This was his second bout of pancreatitis.  Other etiologies for pancreatitis had been excluded so it was felt that gallstone pancreatitis was the most likely etiology.  Pre-operative Diagnosis: Calculus of gallbladder with acute cholecystitis, without mention of obstruction; probable gallstone pancreatitis  Post-operative Diagnosis: Same  Surgeon: Gaynelle Adu MD FACS  Assistants: Clenton Pare RNFA  Anesthesia: General endotracheal anesthesia  Procedure Details  The patient was seen again in the Holding Room. The risks, benefits, complications, treatment options, and expected outcomes were discussed with the patient. The possibilities of reaction to medication, pulmonary aspiration, perforation of viscus, bleeding, recurrent infection, finding a normal gallbladder, the need for additional procedures, failure to diagnose a condition, the possible need to convert to an open procedure, and creating a complication requiring transfusion or operation were discussed with the patient. The likelihood of improving the patient's symptoms with return to their baseline status is good.  The patient and/or family concurred with the proposed plan, giving informed consent. The site of surgery properly noted. The patient was taken to Operating Room, identified as Vincent Collins and the procedure verified as Laparoscopic Cholecystectomy with Intraoperative Cholangiogram. A Time Out was held and the above information confirmed. Antibiotic prophylaxis was administered.   Prior to the induction of general anesthesia,  antibiotic prophylaxis was administered. General endotracheal anesthesia was then administered and tolerated well. After the induction, the abdomen was prepped with Chloraprep and draped in the sterile fashion. The patient was positioned in the supine position.  He had a umbilical hernia containing fat.  Therefore I decided to avoid his umbilical area.  Optiview entry was made in the left upper quadrant in Palmer's point with a 0 degree 5 mm laparoscope through a 5 mm trocar.  The abdominal cavity was entered.  No evidence of injury to surrounding structures.  Umbilical fascial defect was probably around 2-1/2 cm.  There is nothing contained within it.  Patient was placed in reverse Trendelenburg and rotated to the left.  A 5 mm trocar was placed in the supraumbilical position.  I then exchanged the left upper quadrant 5 mm trocar for an 11 mm trocar.     Two 5-mm ports were placed in the right upper quadrant. All skin incisions were infiltrated with a local anesthetic agent before making the incision and placing the trocars.    The gallbladder was identified, the fundus grasped and retracted cephalad. Adhesions were lysed bluntly and with the electrocautery where indicated, taking care not to injure any adjacent organs or viscus.  The duodenum was likely tethered to the body and infundibulum of the gallbladder.  It was gently swept down with the suction irrigator catheter. the infundibulum was grasped and retracted laterally, exposing the peritoneum overlying the triangle of Calot. This was then divided and exposed in a blunt fashion. A critical view of the cystic duct and cystic artery was obtained.  The cystic duct was clearly identified and bluntly dissected circumferentially. The cystic duct was ligated with a clip distally.   An incision was made in the cystic duct and the [facility] cholangiogram catheter introduced. The catheter was secured using a clip. A cholangiogram was then obtained  which showed good  visualization of the distal and proximal biliary tree with no sign of filling defects or obstruction.  Contrast flowed easily into the duodenum. The catheter was then removed.   The cystic duct was then ligated with clips and divided. The cystic artery which had been identified & dissected free was ligated with clips and divided as well.   The gallbladder was dissected from the liver bed in retrograde fashion with the electrocautery. The gallbladder was removed and placed in an Ecco sac. The liver bed was irrigated and inspected. Hemostasis was achieved with the electrocautery. Copious irrigation was utilized and was repeatedly aspirated until clear.   The gallbladder and Ecco sac were then removed through the LUQ port site.   2 interrupted 0 Vicryl sutures were placed at the left upper quadrant trocar site with a PMI suture passer with laparoscopic guidance.  There was no air leak.  There is nothing trapped within it.  Local was infiltrated around this area and along the right lateral abdominal wall with a tap block.  We again inspected the right upper quadrant for hemostasis.  Pneumoperitoneum was released as we removed the trocars.  4-0 Monocryl was used to close the skin.    steri-strips, and clean dressings were applied. The patient was then extubated and brought to the recovery room in stable condition. Instrument, sponge, and needle counts were correct at closure and at the conclusion of the case.   Findings: Cholecystitis with Cholelithiasis +critical view Normal ioc  Estimated Blood Loss: Minimal         Drains: None         Specimens: Gallbladder           Complications: None; patient tolerated the procedure well.         Disposition: PACU - hemodynamically stable.         Condition: stable  Mary Sella. Andrey Campanile, MD, FACS General, Bariatric, & Minimally Invasive Surgery [facility], [place]

## 2020-08-03 NOTE — Anesthesia Preprocedure Evaluation (Signed)
Anesthesia Evaluation  Patient identified by MRN, date of birth, ID band Patient awake    Reviewed: Allergy & Precautions, H&P , NPO status , Patient's Chart, lab work & pertinent test results  Airway Mallampati: II  TM Distance: >3 FB Neck ROM: Full    Dental  (+) Dental Advisory Given, Edentulous Upper, Edentulous Lower   Pulmonary COPD,  COPD inhaler, former smoker,    Pulmonary exam normal breath sounds clear to auscultation       Cardiovascular Exercise Tolerance: Good hypertension, Pt. on medications and Pt. on home beta blockers Normal cardiovascular exam Rhythm:Regular Rate:Normal  Echo 02/2020 1. Left ventricular ejection fraction, by estimation, is 65 to 70%. The left ventricle has normal function. The left ventricle has no regional wall motion abnormalities. There is moderate left ventricular hypertrophy. Left ventricular diastolic function could not be evaluated.  2. Right ventricular systolic function is normal. The right ventricular size is normal.  3. Left atrial size was severely dilated.  4. The mitral valve was not well visualized. No evidence of mitral valve regurgitation.  5. The aortic valve was not well visualized. Aortic valve regurgitation is not visualized.  6. Aortic dilatation noted. 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  7. The inferior vena cava is normal in size with greater than 50% respiratory variability, suggesting right atrial pressure of 3 mmHg.    Neuro/Psych PSYCHIATRIC DISORDERS Anxiety negative neurological ROS     GI/Hepatic Neg liver ROS, GERD  ,  Endo/Other  diabetes, Poorly Controlled, Type 2, Oral Hypoglycemic Agents  Renal/GU Renal disease  negative genitourinary   Musculoskeletal negative musculoskeletal ROS (+)   Abdominal   Peds negative pediatric ROS (+)  Hematology negative hematology ROS (+)   Anesthesia Other Findings    Reproductive/Obstetrics negative OB ROS                             Anesthesia Physical  Anesthesia Plan  ASA: III  Anesthesia Plan: General   Post-op Pain Management:    Induction: Intravenous  PONV Risk Score and Plan: 4 or greater and Ondansetron, Dexamethasone and Treatment may vary due to age or medical condition  Airway Management Planned: Oral ETT  Additional Equipment: None  Intra-op Plan:   Post-operative Plan: Extubation in OR  Informed Consent: I have reviewed the patients History and Physical, chart, labs and discussed the procedure including the risks, benefits and alternatives for the proposed anesthesia with the patient or authorized representative who has indicated his/her understanding and acceptance.     Dental advisory given  Plan Discussed with: CRNA  Anesthesia Plan Comments:         Anesthesia Quick Evaluation

## 2020-08-03 NOTE — Progress Notes (Signed)
PROGRESS NOTE    Vincent Collins  DVV:[id number] DOB: 10-16-1941 DOA: 07/31/2020 PCP: Dettinger, Elige Radon, MD   Brief Narrative:   Vincent Collins a 79 y.o.malewith medical history significant ofparoxysmal A. fib on Eliquis,COPD, DM 2, HTN, HLD,ureteral stones, diverticulitis,pancreatitis with unknown etiology in March 2021, presented with recurrent right RUQ abdominal pain. Symptoms started at night, cramping like 8-9/10, with fever of 101 at home and episode of chills, pain radiates to right shoulder associated with short of breath. Feeling nauseous but no vomiting.  8/6: Now s/p lap chole. D/c zosyn. Start diet and advance as tolerated. PT eval ordered. Hopefully home in AM.   Assessment & Plan: Acute recurrent pancreatitis Elevated LFTs/Hyperbilirubinemia Cholecystitis - suspicion for gallstone/sludge associated - MRCP: 1. MR findings consistent with acute pancreatitis. No findings for pancreatic necrosis. 2. Normal caliber and course of the common bile duct. No common bile duct stones are identified but exam is limited by motion artifact. 3. Abnormal gallbladder with gallbladder wall thickening, edema and enhancement and some debris. No obvious or definite gallstones. 4. Simple appearing hepatic and renal cysts. - GI has s/o'd; Gen surgery on board; appreciate assistance     - s/p lab chole, on CLD, advance as tolerated  AKI - SCr at admission 1.71; baseline is 0.9 - 1 - started on IVFs - 8/6: Scr stable, follow  HLD - holding home statin until resolution of elevated LFTs  Paroxysmal A. Fib - home eliquis held d/t possible need for procedure; will resume if no procedure required - heparin S/Q  DMt2 - last A1c (05/18/20): 6.8 - SSI, DM diet, follow  Thrombocytopenia - improving. Up to 142.   HTN     - lisinopril resumed   Generalized weakness     - PT eval ordered  DVT prophylaxis: heparin Code Status:  FULL Family Communication: With wife at bedside   Status is: Inpatient  Remains inpatient appropriate because:Inpatient level of care appropriate due to severity of illness   Dispo: The patient is from: Home              Anticipated d/c is to: Home              Anticipated d/c date is: 1 day              Patient currently is not medically stable to d/c.  Consultants:   Gen surgery  GI  Procedures:   Lap cholecystectomy   ROS:  Denies CP, N, V, ab pain. Reports weakness . Remainder ROS is negative for all not previously mentioned.  Subjective: "I'm doing so much better now. Thank you."  Objective: Vitals:   08/03/20 0028 08/03/20 0424 08/03/20 0432 08/03/20 0437  BP: (!) 161/80  (!) 174/93 (!) 168/87  Pulse: (!) 56  73 68  Resp:   20   Temp: 97.6 F (36.4 C)  97.7 F (36.5 C)   TempSrc: Oral  Oral   SpO2: 96%  97%   Weight:  109.9 kg    Height:        Intake/Output Summary (Last 24 hours) at 08/03/2020 0715 Last data filed at 08/03/2020 0546 Gross per 24 hour  Intake 5665.4 ml  Output 4125 ml  Net 1540.4 ml   Filed Weights   08/01/20 0007 08/02/20 0445 08/03/20 0424  Weight: 108.6 kg 109.6 kg 109.9 kg    Examination:  General: 79 y.o. male resting in bed in NAD Cardiovascular: RRR, +S1, S2, no m/g/r, equal pulses  throughout Respiratory: CTABL, no w/r/r, normal WOB GI: BS+, trochar sites noted, slight TTP, ND MSK: No e/c/c Neuro: A&O x 3, no focal deficits Psyc: Appropriate interaction and affect, calm/cooperative   Data Reviewed: I have personally reviewed following labs and imaging studies.  CBC: Recent Labs  Lab 07/31/20 0333 08/01/20 0625 08/02/20 0537  WBC 12.3* 7.5 6.2  NEUTROABS  --   --  4.5  HGB 15.7 14.1 13.8  HCT 46.6 42.6 40.6  MCV 90.0 88.0 88.6  PLT 135* 115* 128*   Basic Metabolic Panel: Recent Labs  Lab 07/31/20 0333 08/01/20 0625 08/02/20 0537  NA 133* 138 137  K 4.0 3.8 3.5  CL 98 104 106  CO2 24 25 22   GLUCOSE  186* 121* 88  BUN 23 22 16   CREATININE 1.71* 1.36* 1.10  CALCIUM 8.3* 8.1* 8.2*  MG  --   --  1.8  PHOS  --   --  2.6   GFR: Estimated Creatinine Clearance: 66.5 mL/min (by C-G formula based on SCr of 1.1 mg/dL). Liver Function Tests: Recent Labs  Lab 07/31/20 0838 08/01/20 0625 08/02/20 0537  AST 218* 100* 51*  ALT 516* 328* 222*  ALKPHOS 123 110 112  BILITOT 8.2* 6.5* 3.2*  PROT 5.6* 5.2* 5.2*  ALBUMIN 3.0* 2.6* 2.6*  2.5*   Recent Labs  Lab 07/31/20 0838 08/01/20 0625 08/02/20 0537  LIPASE 1,725* 754* 190*   No results for input(s): AMMONIA in the last 168 hours. Coagulation Profile: Recent Labs  Lab 07/31/20 0333  INR 1.3*   Cardiac Enzymes: No results for input(s): CKTOTAL, CKMB, CKMBINDEX, TROPONINI in the last 168 hours. BNP (last 3 results) No results for input(s): PROBNP in the last 8760 hours. HbA1C: No results for input(s): HGBA1C in the last 72 hours. CBG: No results for input(s): GLUCAP in the last 168 hours. Lipid Profile: No results for input(s): CHOL, HDL, LDLCALC, TRIG, CHOLHDL, LDLDIRECT in the last 72 hours. Thyroid Function Tests: No results for input(s): TSH, T4TOTAL, FREET4, T3FREE, THYROIDAB in the last 72 hours. Anemia Panel: No results for input(s): VITAMINB12, FOLATE, FERRITIN, TIBC, IRON, RETICCTPCT in the last 72 hours. Sepsis Labs: No results for input(s): PROCALCITON, LATICACIDVEN in the last 168 hours.  Recent Results (from the past 240 hour(s))  SARS Coronavirus 2 by RT PCR (hospital order, performed in [facility] hospital lab) Nasopharyngeal Nasopharyngeal Swab     Status: None   Collection Time: 07/31/20  1:46 PM   Specimen: Nasopharyngeal Swab  Result Value Ref Range Status   SARS Coronavirus 2 NEGATIVE NEGATIVE Final    Comment: (NOTE) SARS-CoV-2 target nucleic acids are NOT DETECTED.  The SARS-CoV-2 RNA is generally detectable in upper and lower respiratory specimens during the acute phase of infection. The  lowest concentration of SARS-CoV-2 viral copies this assay can detect is 250 copies / mL. A negative result does not preclude SARS-CoV-2 infection and should not be used as the sole basis for treatment or other patient management decisions.  A negative result may occur with improper specimen collection / handling, submission of specimen other than nasopharyngeal swab, presence of viral mutation(s) within the areas targeted by this assay, and inadequate number of viral copies (<250 copies / mL). A negative result must be combined with clinical observations, patient history, and epidemiological information.  Fact Sheet for Patients:   [facility]  Fact Sheet for Healthcare Providers: 09/30/20  This test is not yet approved or  cleared by the [employer] FDA and has  been authorized for detection and/or diagnosis of SARS-CoV-2 by FDA under an Emergency Use Authorization (EUA).  This EUA will remain in effect (meaning this test can be used) for the duration of the COVID-19 declaration under Section 564(b)(1) of the Act, 21 U.S.C. section 360bbb-3(b)(1), unless the authorization is terminated or revoked sooner.  Performed at [facility], [address] [place], [place] [address]       Radiology Studies: No results found.   Scheduled Meds: . cholecalciferol  1,000 Units Oral Daily  . heparin  5,000 Units Subcutaneous Q12H  . lisinopril  20 mg Oral Daily  . metoprolol tartrate  25 mg Oral BID  . pantoprazole  40 mg Oral Daily  . sodium chloride flush  3 mL Intravenous Once  . tamsulosin  0.8 mg Oral QPC supper   Continuous Infusions: . sodium chloride 50 mL/hr at 08/02/20 1320  . piperacillin-tazobactam (ZOSYN)  IV 3.375 g (08/03/20 0546)     LOS: 3 days    Time spent: 25 minutes spent in the coordination of care today.    Teddy Spike, DO [facility]  If 7PM-7AM, please contact  night-coverage [email or web address] 08/03/2020, 7:15 AM

## 2020-08-03 NOTE — Progress Notes (Signed)
Subjective: CC: Hungry Patient reports since being placed on cld he has had no epigastric or RUQ abdominal pain, n/v. He has been tolerating cld. Having some soreness around his umbilicus where his hernia is located. BM yesterday.   Objective: Vital signs in last 24 hours: Temp:  [97.6 F (36.4 C)-98.6 F (37 C)] 97.7 F (36.5 C) (08/06 0432) Pulse Rate:  [56-77] 68 (08/06 0437) Resp:  [18-20] 20 (08/06 0432) BP: (155-179)/(73-93) 168/87 (08/06 0437) SpO2:  [96 %-98 %] 97 % (08/06 0432) Weight:  [109.9 kg] 109.9 kg (08/06 0424) Last BM Date: 08/02/20  Intake/Output from previous day: 08/05 0701 - 08/06 0700 In: 5665.4 [P.O.:3660; I.V.:1847; IV Piggyback:158.4] Out: 4125 [Urine:4125] Intake/Output this shift: Total I/O In: -  Out: 350 [Urine:350]  PE: Gen:  Alert, NAD, pleasant Card:  Regular rate and rhythm, pedal pulses 2+ BL Pulm:  Normal effort, clear to auscultation bilaterally Abd: Soft, obese, protuberant, soft reducible umbilical hernia this is mildly tender, NT to the epigastrium and RUQ without rebound or guarding. No peritonitis. +BS Skin: warm and dry, no rashes  Psych: A&Ox3   Lab Results:  Recent Labs    08/01/20 0625 08/02/20 0537  WBC 7.5 6.2  HGB 14.1 13.8  HCT 42.6 40.6  PLT 115* 128*   BMET Recent Labs    08/01/20 0625 08/02/20 0537  NA 138 137  K 3.8 3.5  CL 104 106  CO2 25 22  GLUCOSE 121* 88  BUN 22 16  CREATININE 1.36* 1.10  CALCIUM 8.1* 8.2*   PT/INR No results for input(s): LABPROT, INR in the last 72 hours. CMP     Component Value Date/Time   NA 137 08/02/2020 0537   NA 139 04/13/2020 1423   K 3.5 08/02/2020 0537   CL 106 08/02/2020 0537   CO2 22 08/02/2020 0537   GLUCOSE 88 08/02/2020 0537   BUN 16 08/02/2020 0537   BUN 12 04/13/2020 1423   CREATININE 1.10 08/02/2020 0537   CALCIUM 8.2 (L) 08/02/2020 0537   PROT 5.2 (L) 08/02/2020 0537   PROT 5.9 (L) 04/13/2020 1423   ALBUMIN 2.5 (L) 08/02/2020 0537    ALBUMIN 2.6 (L) 08/02/2020 0537   ALBUMIN 4.0 04/13/2020 1423   AST 51 (H) 08/02/2020 0537   ALT 222 (H) 08/02/2020 0537   ALKPHOS 112 08/02/2020 0537   BILITOT 3.2 (H) 08/02/2020 0537   BILITOT 1.0 04/13/2020 1423   GFRNONAA >60 08/02/2020 0537   GFRAA >60 08/02/2020 0537   Lipase     Component Value Date/Time   LIPASE 190 (H) 08/02/2020 0537       Studies/Results: No results found.  Anti-infectives: Anti-infectives (From admission, onward)   Start     Dose/Rate Route Frequency Ordered Stop   07/31/20 1430  piperacillin-tazobactam (ZOSYN) IVPB 3.375 g     Discontinue     3.375 g 12.5 mL/hr over 240 Minutes Intravenous Every 8 hours 07/31/20 1427         Assessment/Plan A. Fib on Eliquis (last dose 8/2) HTN HLD DM2 COPD (not on home o2)   Umbilical hernia - no evidence of incarceration or obstruction on CT. Reducible on exam.   Recurrent Pancreatitis Possible Acalculous Cholecystitis - Triglycerides 3/21 were 76. No alcohol use since the age of 44.  - MRCP 8/4 showsno evidence of choledocholithiasis or cholelithiasis. There was some gallbladder wall thickening, gallbladder wall edema and debris. - Labs pending this AM. Yesterday, WBC normalized. LFT's improving.  T. Bili 8.2 > 6.5 > 3.2, Lipase 1725 > 754 > 190 - He is NT on exam today. I have explained the procedure, risks, and aftercare of cholecystectomy.  Risks include but are not limited to bleeding, infection, wound problems, anesthesia, diarrhea, bile leak, injury to common bile duct/liver/intestine.  He seems to understand and agrees to proceed.  FEN -NPO VTE -SCDs, please continue to hold Eliquis, on subq heparin ID -Cont Zosyn for possible Acalculous Cholecystitis   LOS: 3 days    Jacinto Halim , [facility] 08/03/2020, 7:18 AM Please see Amion for pager number during day hours 7:00am-4:30pm

## 2020-08-03 NOTE — Discharge Instructions (Signed)
[facility] LAPAROSCOPIC SURGERY: POST OP INSTRUCTIONS Always review your discharge instruction sheet given to you by the facility where your surgery was performed. IF YOU HAVE DISABILITY OR FAMILY LEAVE FORMS, YOU MUST BRING THEM TO THE OFFICE FOR PROCESSING.   DO NOT GIVE THEM TO YOUR DOCTOR.  PAIN CONTROL  1. First take acetaminophen (Tylenol) AND/or ibuprofen (Advil) to control your pain after surgery.  Follow directions on package.  Taking acetaminophen (Tylenol) and/or ibuprofen (Advil) regularly after surgery will help to control your pain and lower the amount of prescription pain medication you may need.  You should not take more than 3,000 mg (3 grams) of acetaminophen (Tylenol) in 24 hours.  You should not take ibuprofen (Advil), aleve, motrin, naprosyn or other NSAIDS if you have a history of stomach ulcers or chronic kidney disease.  2. A prescription for pain medication may be given to you upon discharge.  Take your pain medication as prescribed, if you still have uncontrolled pain after taking acetaminophen (Tylenol) or ibuprofen (Advil). 3. Use ice packs to help control pain. 4. If you need a refill on your pain medication, please contact your pharmacy.  They will contact our office to request authorization. Prescriptions will not be filled after 5pm or on week-ends.  HOME MEDICATIONS 5. Take your usually prescribed medications unless otherwise directed.  DIET 6. You should follow a light diet the first few days after arrival home.  Be sure to include lots of fluids daily. Avoid fatty, fried foods.   CONSTIPATION 7. It is common to experience some constipation after surgery and if you are taking pain medication.  Increasing fluid intake and taking a stool softener (such as Colace) will usually help or prevent this problem from occurring.  A mild laxative (Milk of Magnesia or Miralax) should be taken according to package instructions if there are no bowel  movements after 48 hours.  WOUND/INCISION CARE 8. Most patients will experience some swelling and bruising in the area of the incisions.  Ice packs will help.  Swelling and bruising can take several days to resolve.  9. Unless discharge instructions indicate otherwise, follow guidelines below  a. STERI-STRIPS - you may remove your outer bandages 48 hours after surgery, and you may shower at that time.  You have steri-strips (small skin tapes) in place directly over the incision.  These strips should be left on the skin for 7-10 days.   b. DERMABOND/SKIN GLUE - you may shower in 24 hours.  The glue will flake off over the next 2-3 weeks. 10. Any sutures or staples will be removed at the office during your follow-up visit.  ACTIVITIES 11. You may resume regular (light) daily activities beginning the next day--such as daily self-care, walking, climbing stairs--gradually increasing activities as tolerated.  You may have sexual intercourse when it is comfortable.  Refrain from any heavy lifting or straining until approved by your doctor. a. You may drive when you are no longer taking prescription pain medication, you can comfortably wear a seatbelt, and you can safely maneuver your car and apply brakes.  FOLLOW-UP 12. You should see your doctor in the office for a follow-up appointment approximately 2-3 weeks after your surgery.  You should have been given your post-op/follow-up appointment when your surgery was scheduled.  If you did not receive a post-op/follow-up appointment, make sure that you call for this appointment within a day or two after you arrive home to insure a convenient appointment time.  OTHER   INSTRUCTIONS 13.   WHEN TO CALL YOUR DOCTOR: 1. Fever over 101.0 2. Inability to urinate 3. Continued bleeding from incision. 4. Increased pain, redness, or drainage from the incision. 5. Increasing abdominal pain  The clinic staff is available to answer your questions during regular  business hours.  Please don't hesitate to call and ask to speak to one of the nurses for clinical concerns.  If you have a medical emergency, go to the nearest emergency room or call 911.  A surgeon from [facility] is always on call at the hospital. [address], [address], [place], [place]  [address] ? [address] [address], [place], [place]   [address] [phone] ? [phone] ? FAX [phone] Web site: [email or web address]  .........   Managing Your Pain After Surgery Without Opioids    Thank you for participating in our program to help patients manage their pain after surgery without opioids. This is part of our effort to provide you with the best care possible, without exposing you or your family to the risk that opioids pose.  What pain can I expect after surgery? You can expect to have some pain after surgery. This is normal. The pain is typically worse the day after surgery, and quickly begins to get better. Many studies have found that many patients are able to manage their pain after surgery with Over-the-Counter (OTC) medications such as Tylenol and Motrin. If you have a condition that does not allow you to take Tylenol or Motrin, notify your surgical team.  How will I manage my pain? The best strategy for controlling your pain after surgery is around the clock pain control with Tylenol (acetaminophen) and Motrin (ibuprofen or Advil). Alternating these medications with each other allows you to maximize your pain control. In addition to Tylenol and Motrin, you can use heating pads or ice packs on your incisions to help reduce your pain.  How will I alternate your regular strength over-the-counter pain medication? You will take a dose of pain medication every three hours. ; Start by taking 650 mg of Tylenol (2 pills of 325 mg) ; 3 hours later take 600 mg of Motrin (3 pills of 200 mg) ; 3 hours after taking the Motrin take 650 mg of Tylenol ; 3 hours  after that take 600 mg of Motrin.   - 1 -  See example - if your first dose of Tylenol is at 12:00 PM   12:00 PM Tylenol 650 mg (2 pills of 325 mg)  3:00 PM Motrin 600 mg (3 pills of 200 mg)  6:00 PM Tylenol 650 mg (2 pills of 325 mg)  9:00 PM Motrin 600 mg (3 pills of 200 mg)  Continue alternating every 3 hours   We recommend that you follow this schedule around-the-clock for at least 3 days after surgery, or until you feel that it is no longer needed. Use the table on the last page of this handout to keep track of the medications you are taking. Important: Do not take more than 3000mg of Tylenol or 1800mg of Motrin in a 24-hour period. Do not take ibuprofen/Motrin if you have a history of bleeding stomach ulcers, severe kidney disease, &/or actively taking a blood thinner  What if I still have pain? If you have pain that is not controlled with the over-the-counter pain medications (Tylenol and Motrin or Advil) you might have what we call "breakthrough" pain. You will receive a prescription for a small amount of an opioid   pain medication such as Oxycodone, Tramadol, or Tylenol with Codeine. Use these opioid pills in the first 24 hours after surgery if you have breakthrough pain. Do not take more than 1 pill every 4-6 hours.  If you still have uncontrolled pain after using all opioid pills, don't hesitate to call our staff using the number provided. We will help make sure you are managing your pain in the best way possible, and if necessary, we can provide a prescription for additional pain medication.   Day 1    Time  Name of Medication Number of pills taken  Amount of Acetaminophen  Pain Level   Comments  AM PM       AM PM       AM PM       AM PM       AM PM       AM PM       AM PM       AM PM       Total Daily amount of Acetaminophen Do not take more than  3,000 mg per day      Day 2    Time  Name of Medication Number of pills taken  Amount of Acetaminophen  Pain  Level   Comments  AM PM       AM PM       AM PM       AM PM       AM PM       AM PM       AM PM       AM PM       Total Daily amount of Acetaminophen Do not take more than  3,000 mg per day      Day 3    Time  Name of Medication Number of pills taken  Amount of Acetaminophen  Pain Level   Comments  AM PM       AM PM       AM PM       AM PM          AM PM       AM PM       AM PM       AM PM       Total Daily amount of Acetaminophen Do not take more than  3,000 mg per day      Day 4    Time  Name of Medication Number of pills taken  Amount of Acetaminophen  Pain Level   Comments  AM PM       AM PM       AM PM       AM PM       AM PM       AM PM       AM PM       AM PM       Total Daily amount of Acetaminophen Do not take more than  3,000 mg per day      Day 5    Time  Name of Medication Number of pills taken  Amount of Acetaminophen  Pain Level   Comments  AM PM       AM PM       AM PM       AM PM       AM PM       AM PM         AM PM       AM PM       Total Daily amount of Acetaminophen Do not take more than  3,000 mg per day       Day 6    Time  Name of Medication Number of pills taken  Amount of Acetaminophen  Pain Level  Comments  AM PM       AM PM       AM PM       AM PM       AM PM       AM PM       AM PM       AM PM       Total Daily amount of Acetaminophen Do not take more than  3,000 mg per day      Day 7    Time  Name of Medication Number of pills taken  Amount of Acetaminophen  Pain Level   Comments  AM PM       AM PM       AM PM       AM PM       AM PM       AM PM       AM PM       AM PM       Total Daily amount of Acetaminophen Do not take more than  3,000 mg per day        For additional information about how and where to safely dispose of unused opioid medications - https://www.morepowerful[employer].org  Disclaimer: This document contains information and/or instructional materials adapted  from [facility] for the typical patient with your condition. It does not replace medical advice from your health care provider because your experience may differ from that of the typical patient. Talk to your health care provider if you have any questions about this document, your condition or your treatment plan. Adapted from [facility]   

## 2020-08-03 NOTE — Plan of Care (Signed)
  Problem: Activity: Goal: Risk for activity intolerance will decrease Outcome: Progressing   Problem: Safety: Goal: Ability to remain free from injury will improve Outcome: Progressing   Problem: Coping: Goal: Level of anxiety will decrease Outcome: Completed/Met

## 2020-08-03 NOTE — Transfer of Care (Signed)
Immediate Anesthesia Transfer of Care Note  Patient: DAYNA GEURTS  Procedure(s) Performed: LAPAROSCOPIC CHOLECYSTECTOMY WITH INTRAOPERATIVE CHOLANGIOGRAM (N/A Abdomen)  Patient Location: PACU  Anesthesia Type:General  Level of Consciousness: drowsy and patient cooperative  Airway & Oxygen Therapy: Patient Spontanous Breathing and Patient connected to face mask oxygen  Post-op Assessment: Report given to RN and Post -op Vital signs reviewed and stable  Post vital signs: Reviewed and stable  Last Vitals:  Vitals Value Taken Time  BP 156/69 08/03/20 1106  Temp    Pulse 85 08/03/20 1108  Resp 25 08/03/20 1108  SpO2 94 % 08/03/20 1108  Vitals shown include unvalidated device data.  Last Pain:  Vitals:   08/03/20 0722  TempSrc:   PainSc: 0-No pain         Complications: No complications documented.

## 2020-08-03 NOTE — Plan of Care (Signed)
  Problem: Clinical Measurements: Goal: Will remain free from infection Outcome: Progressing   Problem: Activity: Goal: Risk for activity intolerance will decrease Outcome: Progressing   Problem: Pain Managment: Goal: General experience of comfort will improve Outcome: Progressing   

## 2020-08-03 NOTE — Anesthesia Procedure Notes (Signed)
Procedure Name: Intubation Date/Time: 08/03/2020 9:37 AM Performed by: Modena Morrow, CRNA Pre-anesthesia Checklist: Patient identified, Emergency Drugs available, Suction available and Patient being monitored Patient Re-evaluated:Patient Re-evaluated prior to induction Oxygen Delivery Method: Circle system utilized Preoxygenation: Pre-oxygenation with 100% oxygen Induction Type: IV induction Ventilation: Mask ventilation without difficulty Laryngoscope Size: Miller and 3 Grade View: Grade I Tube type: Oral Tube size: 7.5 mm Number of attempts: 1 Airway Equipment and Method: Stylet and Oral airway Placement Confirmation: ETT inserted through vocal cords under direct vision,  positive ETCO2 and breath sounds checked- equal and bilateral Secured at: 24 cm Tube secured with: Tape Dental Injury: Teeth and Oropharynx as per pre-operative assessment

## 2020-08-04 DIAGNOSIS — D696 Thrombocytopenia, unspecified: Secondary | ICD-10-CM

## 2020-08-04 DIAGNOSIS — E119 Type 2 diabetes mellitus without complications: Secondary | ICD-10-CM

## 2020-08-04 DIAGNOSIS — E785 Hyperlipidemia, unspecified: Secondary | ICD-10-CM

## 2020-08-04 DIAGNOSIS — N179 Acute kidney failure, unspecified: Secondary | ICD-10-CM

## 2020-08-04 LAB — COMPREHENSIVE METABOLIC PANEL
ALT: 160 U/L — ABNORMAL HIGH (ref 0–44)
AST: 56 U/L — ABNORMAL HIGH (ref 15–41)
Albumin: 2.7 g/dL — ABNORMAL LOW (ref 3.5–5.0)
Alkaline Phosphatase: 104 U/L (ref 38–126)
Anion gap: 9 (ref 5–15)
BUN: 10 mg/dL (ref 8–23)
CO2: 24 mmol/L (ref 22–32)
Calcium: 8.4 mg/dL — ABNORMAL LOW (ref 8.9–10.3)
Chloride: 102 mmol/L (ref 98–111)
Creatinine, Ser: 1.18 mg/dL (ref 0.61–1.24)
GFR calc Af Amer: >60 mL/min (ref >60)
GFR calc non Af Amer: 59 mL/min — ABNORMAL LOW (ref >60)
Glucose, Bld: 158 mg/dL — ABNORMAL HIGH (ref 70–99)
Potassium: 3.5 mmol/L (ref 3.5–5.1)
Sodium: 135 mmol/L (ref 135–145)
Total Bilirubin: 2.1 mg/dL — ABNORMAL HIGH (ref 0.3–1.2)
Total Protein: 5.5 g/dL — ABNORMAL LOW (ref 6.5–8.1)

## 2020-08-04 LAB — CBC WITH DIFFERENTIAL/PLATELET
Abs Immature Granulocytes: 0.08 10*3/uL — ABNORMAL HIGH (ref 0.00–0.07)
Basophils Absolute: 0 10*3/uL (ref 0.0–0.1)
Basophils Relative: 0 %
Eosinophils Absolute: 0 10*3/uL (ref 0.0–0.5)
Eosinophils Relative: 0 %
HCT: 42.8 % (ref 39.0–52.0)
Hemoglobin: 14.3 g/dL (ref 13.0–17.0)
Immature Granulocytes: 1 %
Lymphocytes Relative: 11 %
Lymphs Abs: 1.2 10*3/uL (ref 0.7–4.0)
MCH: 29.7 pg (ref 26.0–34.0)
MCHC: 33.4 g/dL (ref 30.0–36.0)
MCV: 88.8 fL (ref 80.0–100.0)
Monocytes Absolute: 1 10*3/uL (ref 0.1–1.0)
Monocytes Relative: 9 %
Neutro Abs: 8.4 10*3/uL — ABNORMAL HIGH (ref 1.7–7.7)
Neutrophils Relative %: 79 %
Platelets: 154 10*3/uL (ref 150–400)
RBC: 4.82 MIL/uL (ref 4.22–5.81)
RDW: 13.9 % (ref 11.5–15.5)
WBC: 10.7 10*3/uL — ABNORMAL HIGH (ref 4.0–10.5)
nRBC: 0 % (ref 0.0–0.2)

## 2020-08-04 LAB — MAGNESIUM: Magnesium: 1.7 mg/dL (ref 1.7–2.4)

## 2020-08-04 MED ORDER — ACETAMINOPHEN 325 MG PO TABS: 650.0000 mg | ORAL_TABLET | Freq: Four times a day (QID) | ORAL | Status: AC | PRN

## 2020-08-04 MED ORDER — OXYCODONE HCL 5 MG PO TABS: 5.0000 mg | ORAL_TABLET | Freq: Four times a day (QID) | ORAL | 0 refills | Status: DC | PRN

## 2020-08-04 NOTE — Evaluation (Signed)
Physical Therapy Evaluation Patient Details Name: Vincent Collins MRN: [id number] DOB: 1941-12-28 Today's Date: 08/04/2020   History of Present Illness  Patient is a 79 y.o. M admitted to the hospital for recurrent pancreatitis. Cholecyctectomy (08/03/20)  Clinical Impression  Patient was sitting up at EOB with family present at start of PT session. He was A&Ox4 and able to answer all questions about home set-up and PLOF. Patient was Independent in sit <> stand transfer and throughout gait training. He stated he walked approximately 500' with nursing staff this morning with no difficulties. Patient was able to ambulate 500' during physical therapy without use of AD, no LOB, and no physical assistance or cueing required. He was left sitting up in bed with family present. He stated he felt like he was at his physical baseline in regards to gait, mobility, endurance, and balance prior to the surgery. No further f/u with physical therapy required after d/c at this time. Educated the patient to continue to use DME equipment at home if he feels unsteady while walking. Signing off for physical therapy needs, thank you the opportunity to work with this patient!    Follow Up Recommendations No PT follow up    Equipment Recommendations  None recommended by PT    Recommendations for Other Services       Precautions / Restrictions Precautions Precautions: None Restrictions Weight Bearing Restrictions: No      Mobility  Bed Mobility               General bed mobility comments: sitting up in bed  Transfers Overall transfer level: Independent                  Ambulation/Gait Ambulation/Gait assistance: Independent Gait Distance (Feet): 500 Feet   Gait Pattern/deviations: Step-through pattern;Trunk flexed Gait velocity: decreased   General Gait Details: ModI with ambulation x 500'. Trunk flexed, decreased velocity, however no LOB, no use of AD, no assist needed.  Stairs             Wheelchair Mobility    Modified Rankin (Stroke Patients Only)       Balance Overall balance assessment: Modified Independent                                           Pertinent Vitals/Pain Pain Assessment: Faces Faces Pain Scale: Hurts little more Pain Location: over incision sites Pain Descriptors / Indicators: Discomfort;Grimacing Pain Intervention(s): Monitored during session    Home Living Family/patient expects to be discharged to:: Private residence Living Arrangements: Spouse/significant other Available Help at Discharge: Family Type of Home: Mobile home Home Access: Other (comment) (0)     Home Layout: One level Home Equipment: Walker - 2 wheels;Cane - single point Additional Comments: uses DME sometimes when he needs    Prior Function Level of Independence: Independent         Comments: 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     [employer]   Dominant Hand: Right    Extremity/Trunk Assessment   Upper Extremity Assessment Upper Extremity Assessment: Defer to OT evaluation    Lower Extremity Assessment Lower Extremity Assessment: Overall WFL for tasks assessed    Cervical / Trunk Assessment Cervical / Trunk Assessment: Kyphotic  Communication   Communication: No difficulties  Cognition Arousal/Alertness: Awake/alert Behavior During Therapy: WFL for tasks assessed/performed Overall Cognitive Status: Within Functional  Limits for tasks assessed                                        General Comments General comments (skin integrity, edema, etc.): No LOB in seated or standing balance    Exercises     Assessment/Plan    PT Assessment Patent does not need any further PT services  PT Problem List         PT Treatment Interventions      PT Goals (Current goals can be found in the Care Plan section)  Acute Rehab PT Goals Patient Stated Goal: go home PT Goal Formulation: With  patient Time For Goal Achievement: 08/18/20 Potential to Achieve Goals: Good    Frequency     Barriers to discharge        Co-evaluation               AM-PAC PT "6 Clicks" Mobility  Outcome Measure Help needed turning from your back to your side while in a flat bed without using bedrails?: None Help needed moving from lying on your back to sitting on the side of a flat bed without using bedrails?: None Help needed moving to and from a bed to a chair (including a wheelchair)?: None Help needed standing up from a chair using your arms (e.g., wheelchair or bedside chair)?: None Help needed to walk in hospital room?: None Help needed climbing 3-5 steps with a railing? : A Little 6 Click Score: 23    End of Session Equipment Utilized During Treatment: Gait belt Activity Tolerance: Patient tolerated treatment well Patient left: in bed;with family/visitor present   PT Visit Diagnosis: Muscle weakness (generalized) (M62.81)    Time:  -      Charges:         Elisha Ponder, SPT, ATC

## 2020-08-04 NOTE — Progress Notes (Signed)
[facility] Progress Note  1 Day Post-Op  Subjective: CC-  Abdomen sore but feeling well this morning. He has not taken any pain medication. Tolerating diet. Denies n/v. BM last night. Ambulated in the halls earlier.  Objective: Vital signs in last 24 hours: Temp:  [97.4 F (36.3 C)-98.1 F (36.7 C)] 98.1 F (36.7 C) (08/07 0910) Pulse Rate:  [58-95] 64 (08/07 0910) Resp:  [16-22] 20 (08/07 0910) BP: (136-162)/(57-77) 153/73 (08/07 0910) SpO2:  [91 %-98 %] 97 % (08/07 0910) Weight:  [110.4 kg] 110.4 kg (08/07 0245) Last BM Date: 08/02/20  Intake/Output from previous day: 08/06 0701 - 08/07 0700 In: 3060.7 [P.O.:1920; I.V.:1140.7] Out: 915 [Urine:900; Blood:15] Intake/Output this shift: Total I/O In: -  Out: 300 [Urine:300]  PE: Gen:  Alert, NAD, pleasant Pulm:  rate and effort normal Abd: Soft, mild distension, nontender, +BS, lap incisions with cdi dressings in place  Lab Results:  Recent Labs    08/03/20 1217 08/04/20 0515  WBC 6.1 10.7*  HGB 14.6 14.3  HCT 43.8 42.8  PLT 142* 154   BMET Recent Labs    08/03/20 1217 08/04/20 0515  NA 142 135  K 3.9 3.5  CL 105 102  CO2 27 24  GLUCOSE 167* 158*  BUN 6* 10  CREATININE 1.10 1.18  CALCIUM 8.7* 8.4*   PT/INR No results for input(s): LABPROT, INR in the last 72 hours. CMP     Component Value Date/Time   NA 135 08/04/2020 0515   NA 139 04/13/2020 1423   K 3.5 08/04/2020 0515   CL 102 08/04/2020 0515   CO2 24 08/04/2020 0515   GLUCOSE 158 (H) 08/04/2020 0515   BUN 10 08/04/2020 0515   BUN 12 04/13/2020 1423   CREATININE 1.18 08/04/2020 0515   CALCIUM 8.4 (L) 08/04/2020 0515   PROT 5.5 (L) 08/04/2020 0515   PROT 5.9 (L) 04/13/2020 1423   ALBUMIN 2.7 (L) 08/04/2020 0515   ALBUMIN 4.0 04/13/2020 1423   AST 56 (H) 08/04/2020 0515   ALT 160 (H) 08/04/2020 0515   ALKPHOS 104 08/04/2020 0515   BILITOT 2.1 (H) 08/04/2020 0515   BILITOT 1.0 04/13/2020 1423   GFRNONAA 59 (L) 08/04/2020  0515   GFRAA >60 08/04/2020 0515   Lipase     Component Value Date/Time   LIPASE 190 (H) 08/02/2020 0537       Studies/Results: DG Cholangiogram Operative  Result Date: 08/03/2020 CLINICAL DATA:  78 year old male with cholelithiasis EXAM: INTRAOPERATIVE CHOLANGIOGRAM TECHNIQUE: Cholangiographic images from the C-arm fluoroscopic device were submitted for interpretation post-operatively. Please see the procedural report for the amount of contrast and the fluoroscopy time utilized. COMPARISON:  None. FINDINGS: Surgical instruments project over the upper abdomen. There is cannulation of the cystic duct/gallbladder neck, with antegrade infusion of contrast. Caliber of the extrahepatic ductal system within normal limits. No definite filling defect within the extrahepatic ducts identified. Free flow of contrast across the ampulla. IMPRESSION: Intraoperative cholangiogram demonstrates extrahepatic biliary ducts of unremarkable caliber, with no definite filling defects identified. Free flow of contrast across the ampulla. Please refer to the dictated operative report for full details of intraoperative findings and procedure Electronically Signed   By: Gilmer Mor D.O.   On: 08/03/2020 10:35    Anti-infectives: Anti-infectives (From admission, onward)   Start     Dose/Rate Route Frequency Ordered Stop   07/31/20 1430  piperacillin-tazobactam (ZOSYN) IVPB 3.375 g  Status:  Discontinued        3.375 g  12.5 mL/hr over 240 Minutes Intravenous Every 8 hours 07/31/20 1427 08/03/20 1203       Assessment/Plan A. Fib on Eliquis (last dose 8/2) HTN HLD DM2 COPD (not on home o2)   Umbilical hernia - no evidence of incarceration or obstruction on CT. Reducible on exam.   Recurrent Pancreatitis, probable gallstone pancreatitis S/p laparoscopic cholecystectomy 8/6 Dr. Andrey Campanile - POD#1 - IOC neg - intraoperatively found Cholecystitis with Cholelithiasis - LFTs down trending  FEN - HH diet VTE  -SCDs, sq heparin ID - Zosyn 8/3>>8/6  Plan: Patient stable for discharge from surgical standpoint. Discharge instructions and follow up info on AVS. I sent rx for oxycodone to pharmacy. He does not need any more antibiotics. Ok to restart anticoagulation.   LOS: 4 days    Franne Forts, [facility] 08/04/2020, 9:46 AM Please see Amion for pager number during day hours 7:00am-4:30pm

## 2020-08-04 NOTE — Discharge Summary (Signed)
Physician Discharge Summary  Vincent EaringJames D Polito ZOX:[id number] DOB: 08-27-1941 DOA: 07/31/2020  PCP: Dettinger, Elige RadonJoshua A, MD  Admit date: 07/31/2020 Discharge date: 08/04/2020  Admitted From: Home Disposition:  Discharged to home.  Recommendations for Outpatient Follow-up:  1. Follow up with PCP in 1 week 2. Please obtain BMP/CBC in one week  Discharge Condition: Stable  CODE STATUS: FULL   Brief/Interim Summary: Quita SkyeJames D Collins a 79 y.o.malewith medical history significant ofparoxysmal A. fib on Eliquis,COPD, DM 2, HTN, HLD,ureteral stones, diverticulitis,pancreatitis with unknown etiology in March 2021, presented with recurrent right RUQ abdominal pain. Symptoms started at night, cramping like 8-9/10, with fever of 101 at home and episode of chills, pain radiates to right shoulder associated with short of breath. Feeling nauseous but no vomiting.  8/7: Now s/p lap chole. Tolerating diet. Reviewed by gen surg. He is stable for discharge. Will discharge to home. Surgical care/instructions explained in detail by gen surgery. He will follow up with them in a couple of weeks. He will need to see his PCP in 1 week at discharge.   Discharge Diagnoses:  Acute recurrent pancreatitis Elevated LFTs/Hyperbilirubinemia Cholecystitis - suspicion for gallstone/sludge associated - MRCP: 1. MR findings consistent with acute pancreatitis. No findings for pancreatic necrosis. 2. Normal caliber and course of the common bile duct. No common bile duct stones are identified but exam is limited by motion artifact. 3. Abnormal gallbladder with gallbladder wall thickening, edema and enhancement and some debris. No obvious or definite gallstones. 4. Simple appearing hepatic and renal cysts. - GI has s/o'd; Gen surgery on board; appreciate assistance     - s/p lab chole, on CLD, advance as tolerated  AKI - SCr at admission 1.71; baseline is 0.9 - 1 - started on IVFs -Scr is stable this  AM (1.18). Follow up with PCP  HLD - holding home statin until resolution of elevated LFTs  Paroxysmal A. Fib - home eliquis held d/t possible need for procedure; will resume if no procedure required - heparin S/Q     - resume home eliquis at discharge  DMt2 - last A1c (05/18/20): 6.8 - SSI, DM diet, follow  Thrombocytopenia - improving. Up to 142.   HTN - lisinopril resumed     - continue home meds at discharge   Discharge Instructions   Allergies as of 08/04/2020   No Known Allergies     Medication List    TAKE these medications   acetaminophen 325 MG tablet Commonly known as: TYLENOL Take 2 tablets (650 mg total) by mouth every 6 (six) hours as needed for mild pain (or Fever >/= 101).   albuterol (2.5 MG/3ML) 0.083% nebulizer solution Commonly known as: PROVENTIL USE 1 VIAL VIA NEBULIZER 4  TIMES DAILY AS NEEDED FOR  WHEEZING OR SHORTNESS OF  BREATH What changed: See the new instructions.   apixaban 5 MG Tabs tablet Commonly known as: ELIQUIS Take 1 tablet (5 mg total) by mouth 2 (two) times daily.   atorvastatin 40 MG tablet Commonly known as: LIPITOR TAKE 1 TABLET BY MOUTH  DAILY   CO-Q 10 Omega-3 Fish Oil Caps Take 1 capsule by mouth in the morning. 100mg  Capsule Qunol   dicyclomine 20 MG tablet Commonly known as: BENTYL Take 20 mg by mouth 3 (three) times daily as needed for spasms.   fluticasone 50 MCG/ACT nasal spray Commonly known as: FLONASE USE 1 SPRAY INTO BOTH  NOSTRILS 2 TIMES DAILY AS  NEEDED FOR ALLERGIES OR  RHINITIS   lisinopril  20 MG tablet Commonly known as: ZESTRIL TAKE 1 TABLET BY MOUTH  DAILY   metFORMIN 500 MG 24 hr tablet Commonly known as: GLUCOPHAGE-XR TAKE 1 TABLET BY MOUTH  TWICE DAILY What changed: when to take this   metoprolol succinate 50 MG 24 hr tablet Commonly known as: TOPROL-XL TAKE 1 TABLET BY MOUTH  DAILY WITH OR IMMEDIATELY  FOLLOWING A MEAL What changed: See the new  instructions.   omeprazole 20 MG capsule Commonly known as: PRILOSEC TAKE 1 CAPSULE BY MOUTH  DAILY   ondansetron 4 MG disintegrating tablet Commonly known as: ZOFRAN-ODT Take 4 mg by mouth every 8 (eight) hours as needed for nausea.   oxyCODONE 5 MG immediate release tablet Commonly known as: Oxy IR/ROXICODONE Take 1 tablet (5 mg total) by mouth every 6 (six) hours as needed for severe pain.   tamsulosin 0.4 MG Caps capsule Commonly known as: FLOMAX TAKE 2 CAPSULES BY MOUTH  DAILY AFTER SUPPER What changed: See the new instructions.   Vitamin D 1000 units capsule Take 1,000 Units by mouth in the morning.       Follow-up Information    Dettinger, Elige Radon, MD Follow up.   Specialties: Family Medicine, Cardiology Contact information: [address] [place] [address] [phone]        Sande Rives, MD .   Specialties: Internal Medicine, Cardiology, Radiology Contact information: [address] [place] [address] [phone]        Surgery, [place]. Go on 08/23/2020.   Specialty: General Surgery Why: 08/26 at 1:30 pm.  Please bring a copy of your photo ID and insurance card to your appointment. Please arrive 30 minutes prior to your appointment for paperwork.  Contact information: [address] [place] [address] [phone]              No Known Allergies  Consultations:  General Surgery  GI  Procedures/Studies: DG Chest 2 View  Result Date: 07/31/2020 CLINICAL DATA:  Chest pain EXAM: CHEST - 2 VIEW COMPARISON:  03/28/2020 FINDINGS: Subsegmental atelectasis at the bases. No consolidation or pleural effusion. Borderline cardiomegaly with aortic atherosclerosis. No pneumothorax. IMPRESSION: Mildly low lung volumes with subsegmental atelectasis at the bases. Electronically Signed   By: Jasmine Pang M.D.   On: 07/31/2020 03:40   DG Cholangiogram Operative  Result Date: 08/03/2020 CLINICAL DATA:  78 year old  male with cholelithiasis EXAM: INTRAOPERATIVE CHOLANGIOGRAM TECHNIQUE: Cholangiographic images from the C-arm fluoroscopic device were submitted for interpretation post-operatively. Please see the procedural report for the amount of contrast and the fluoroscopy time utilized. COMPARISON:  None. FINDINGS: Surgical instruments project over the upper abdomen. There is cannulation of the cystic duct/gallbladder neck, with antegrade infusion of contrast. Caliber of the extrahepatic ductal system within normal limits. No definite filling defect within the extrahepatic ducts identified. Free flow of contrast across the ampulla. IMPRESSION: Intraoperative cholangiogram demonstrates extrahepatic biliary ducts of unremarkable caliber, with no definite filling defects identified. Free flow of contrast across the ampulla. Please refer to the dictated operative report for full details of intraoperative findings and procedure Electronically Signed   By: Gilmer Mor D.O.   On: 08/03/2020 10:35   CT ABDOMEN PELVIS W CONTRAST  Result Date: 07/31/2020 CLINICAL DATA:  Epigastric pain, tenderness to palpation EXAM: CT ABDOMEN AND PELVIS WITH CONTRAST TECHNIQUE: Multidetector CT imaging of the abdomen and pelvis was performed using the standard protocol following bolus administration of intravenous contrast. CONTRAST:  64mL OMNIPAQUE IOHEXOL 300 MG/ML  SOLN COMPARISON:  07/29/2020 FINDINGS: Lower chest: No acute abnormality. Scarring of the included bilateral lung bases. Hepatobiliary: No solid liver abnormality is seen. No gallstones or biliary dilatation. Thickening of the gallbladder wall, new compared to prior examination. (Series 6, image 28). Pancreas: Mild inflammatory fat stranding about the inferior head of the pancreas adjacent to the duodenum as below. No pancreatic ductal dilatation or surrounding inflammatory changes. Spleen: Normal in size without significant abnormality. Adrenals/Urinary Tract: Adrenal glands are  unremarkable. Kidneys are normal, without renal calculi, solid lesion, or hydronephrosis. Bladder is unremarkable. Stomach/Bowel: Stomach is within normal limits. There is new inflammatory fat stranding about the descending and transverse portions of the duodenum (series 6, image 43). Incidental diverticulum of the transverse duodenum. Appendix appears normal. No evidence of bowel wall thickening, distention, or inflammatory changes. Colonic diverticulosis. Vascular/Lymphatic: Aortic atherosclerosis. No enlarged abdominal or pelvic lymph nodes. Reproductive: Mild prostatomegaly. Other: Fat containing umbilical hernia.  No abdominopelvic ascites. Musculoskeletal: No acute or significant osseous findings. IMPRESSION: 1. There is new inflammatory fat stranding about the descending and transverse portions of the duodenum. Findings are consistent with either nonspecific infectious or inflammatory duodenitis or alternately groove pancreatitis. 2. Thickening of the gallbladder wall, new compared to prior examination, and of uncertain significance, possibly reactive to adjacent inflammation. 3. Overall constellation of findings could be related to a passed gallstone and gallstone pancreatitis. 4. Colonic diverticulosis without evidence for diverticulitis. 5. Mild prostatomegaly. 6. Aortic Atherosclerosis (ICD10-I70.0). Electronically Signed   By: Lauralyn Primes M.D.   On: 07/31/2020 11:57   MR 3D Recon At Scanner  Result Date: 07/31/2020 CLINICAL DATA:  Acute pancreatitis. EXAM: MRI ABDOMEN WITHOUT AND WITH CONTRAST (INCLUDING MRCP) TECHNIQUE: Multiplanar multisequence MR imaging of the abdomen was performed both before and after the administration of intravenous contrast. Heavily T2-weighted images of the biliary and pancreatic ducts were obtained, and three-dimensional MRCP images were rendered by post processing. CONTRAST:  10mL GADAVIST GADOBUTROL 1 MMOL/ML IV SOLN COMPARISON:  CT scan 07/31/2020 FINDINGS: Lower chest:  The lung bases are clear of an acute process. No pleural effusions. No pericardial effusion. Hepatobiliary: Small hepatic cysts are noted. No worrisome hepatic lesions. No intrahepatic biliary dilatation. Normal caliber and course of the common bile duct. No common bile duct stones are identified but exam is limited by motion artifact. The gallbladder is abnormal. There is gallbladder wall thickening, gallbladder wall edema and some debris. No obvious or definite gallstones. Pancreas: MR findings consistent with acute pancreatitis. Diffuse inflammation/edema involving the pancreas and peripancreatic tissues. There is also moderate inflammation of the second and third portions of the duodenum. Normal pancreatic enhancement. No findings for pancreatic necrosis. Spleen:  Normal size.  No focal lesions. Adrenals/Urinary Tract: The adrenal glands and kidneys are unremarkable. Small renal cysts are noted. Stomach/Bowel: The stomach is unremarkable. Inflammation of the duodenum is secondary to pancreatitis. The visualized small bowel and colon are grossly normal. Vascular/Lymphatic: The aorta and branch vessels are patent. The major venous structures are patent. No retroperitoneal adenopathy. Other:  No ascites or abdominal wall hernia. Musculoskeletal: No significant bony findings. IMPRESSION: 1. MR findings consistent with acute pancreatitis. No findings for pancreatic necrosis. 2. Normal caliber and course of the common bile duct. No common bile duct stones are identified but exam is limited by motion artifact. 3. Abnormal gallbladder with gallbladder wall thickening, edema and enhancement and some debris. No obvious or definite gallstones. 4. Simple appearing hepatic and renal cysts. Electronically Signed   By: Rudie Meyer  M.D.   On: 07/31/2020 18:40   MR ABDOMEN MRCP W WO CONTAST  Result Date: 07/31/2020 CLINICAL DATA:  Acute pancreatitis. EXAM: MRI ABDOMEN WITHOUT AND WITH CONTRAST (INCLUDING MRCP) TECHNIQUE:  Multiplanar multisequence MR imaging of the abdomen was performed both before and after the administration of intravenous contrast. Heavily T2-weighted images of the biliary and pancreatic ducts were obtained, and three-dimensional MRCP images were rendered by post processing. CONTRAST:  10mL GADAVIST GADOBUTROL 1 MMOL/ML IV SOLN COMPARISON:  CT scan 07/31/2020 FINDINGS: Lower chest: The lung bases are clear of an acute process. No pleural effusions. No pericardial effusion. Hepatobiliary: Small hepatic cysts are noted. No worrisome hepatic lesions. No intrahepatic biliary dilatation. Normal caliber and course of the common bile duct. No common bile duct stones are identified but exam is limited by motion artifact. The gallbladder is abnormal. There is gallbladder wall thickening, gallbladder wall edema and some debris. No obvious or definite gallstones. Pancreas: MR findings consistent with acute pancreatitis. Diffuse inflammation/edema involving the pancreas and peripancreatic tissues. There is also moderate inflammation of the second and third portions of the duodenum. Normal pancreatic enhancement. No findings for pancreatic necrosis. Spleen:  Normal size.  No focal lesions. Adrenals/Urinary Tract: The adrenal glands and kidneys are unremarkable. Small renal cysts are noted. Stomach/Bowel: The stomach is unremarkable. Inflammation of the duodenum is secondary to pancreatitis. The visualized small bowel and colon are grossly normal. Vascular/Lymphatic: The aorta and branch vessels are patent. The major venous structures are patent. No retroperitoneal adenopathy. Other:  No ascites or abdominal wall hernia. Musculoskeletal: No significant bony findings. IMPRESSION: 1. MR findings consistent with acute pancreatitis. No findings for pancreatic necrosis. 2. Normal caliber and course of the common bile duct. No common bile duct stones are identified but exam is limited by motion artifact. 3. Abnormal gallbladder with  gallbladder wall thickening, edema and enhancement and some debris. No obvious or definite gallstones. 4. Simple appearing hepatic and renal cysts. Electronically Signed   By: Rudie Meyer M.D.   On: 07/31/2020 18:40      Subjective: "I think I'm ready."  Discharge Exam: Vitals:   08/04/20 0255 08/04/20 0910  BP: (!) 157/74 (!) 153/73  Pulse: (!) 58 64  Resp: 17 20  Temp: 97.7 F (36.5 C) 98.1 F (36.7 C)  SpO2: 96% 97%   Vitals:   08/03/20 2005 08/04/20 0245 08/04/20 0255 08/04/20 0910  BP: (!) 162/77  (!) 157/74 (!) 153/73  Pulse: 63  (!) 58 64  Resp: Temp: 97.8 F (36.6 C)  97.7 F (36.5 C) 98.1 F (36.7 C)  TempSrc: Oral  Oral Oral  SpO2: 95%  96% 97%  Weight:  110.4 kg    Height:        General: 79 y.o. male resting in bed in NAD Cardiovascular: RRR, +S1, S2, no m/g/r, equal pulses throughout Respiratory: CTABL, no w/r/r, normal WOB GI: BS+, ND, RUQ mild TTP, no masses noted, no organomegaly noted MSK: No e/c/c Neuro: A&O x 3, no focal deficits Psyc: Appropriate interaction and affect, calm/cooperative   The results of significant diagnostics from this hospitalization (including imaging, microbiology, ancillary and laboratory) are listed below for reference.     Microbiology: Recent Results (from the past 240 hour(s))  SARS Coronavirus 2 by RT PCR (hospital order, performed in [facility] hospital lab) Nasopharyngeal Nasopharyngeal Swab     Status: None   Collection Time: 07/31/20  1:46 PM   Specimen: Nasopharyngeal Swab  Result  Value Ref Range Status   SARS Coronavirus 2 NEGATIVE NEGATIVE Final    Comment: (NOTE) SARS-CoV-2 target nucleic acids are NOT DETECTED.  The SARS-CoV-2 RNA is generally detectable in upper and lower respiratory specimens during the acute phase of infection. The lowest concentration of SARS-CoV-2 viral copies this assay can detect is 250 copies / mL. A negative result does not preclude SARS-CoV-2 infection and  should not be used as the sole basis for treatment or other patient management decisions.  A negative result may occur with improper specimen collection / handling, submission of specimen other than nasopharyngeal swab, presence of viral mutation(s) within the areas targeted by this assay, and inadequate number of viral copies (<250 copies / mL). A negative result must be combined with clinical observations, patient history, and epidemiological information.  Fact Sheet for Patients:   BoilerBrush.com.cy  Fact Sheet for Healthcare Providers: https://pope.com/  This test is not yet approved or  cleared by the [place] FDA and has been authorized for detection and/or diagnosis of SARS-CoV-2 by FDA under an Emergency Use Authorization (EUA).  This EUA will remain in effect (meaning this test can be used) for the duration of the COVID-19 declaration under Section 564(b)(1) of the Act, 21 U.S.C. section 360bbb-3(b)(1), unless the authorization is terminated or revoked sooner.  Performed at [facility], [address], [place], [place] [address]   Surgical pcr screen     Status: None   Collection Time: 08/03/20  7:43 AM   Specimen: Nasal Mucosa; Nasal Swab  Result Value Ref Range Status   MRSA, PCR NEGATIVE NEGATIVE Final   Staphylococcus aureus NEGATIVE NEGATIVE Final    Comment: (NOTE) The Xpert SA Assay (FDA approved for NASAL specimens in patients 64 years of age and older), is one component of a comprehensive surveillance program. It is not intended to diagnose infection nor to guide or monitor treatment. Performed at [facility], [address] [place], [place] [address]      Labs: BNP (last 3 results) Recent Labs    03/26/20 0256 03/28/20 0344  BNP 230.0* 187.9*   Basic Metabolic Panel: Recent Labs  Lab 07/31/20 0333 08/01/20 0625 08/02/20 0537 08/03/20 1217 08/04/20 0515  NA 133* 138 137 142  135  K 4.0 3.8 3.5 3.9 3.5  CL 98 104 106 105 102  CO2 24 25 22 27 24   GLUCOSE 186* 121* 88 167* 158*  BUN 23 22 16  6* 10  CREATININE 1.71* 1.36* 1.10 1.10 1.18  CALCIUM 8.3* 8.1* 8.2* 8.7* 8.4*  MG  --   --  1.8  --  1.7  PHOS  --   --  2.6 2.6  --    Liver Function Tests: Recent Labs  Lab 07/31/20 0838 08/01/20 0625 08/02/20 0537 08/03/20 1217 08/04/20 0515  AST 218* 100* 51*  --  56*  ALT 516* 328* 222*  --  160*  ALKPHOS 123 110 112  --  104  BILITOT 8.2* 6.5* 3.2*  --  2.1*  PROT 5.6* 5.2* 5.2*  --  5.5*  ALBUMIN 3.0* 2.6* 2.6*  2.5* 2.7* 2.7*   Recent Labs  Lab 07/31/20 0838 08/01/20 0625 08/02/20 0537  LIPASE 1,725* 754* 190*   No results for input(s): AMMONIA in the last 168 hours. CBC: Recent Labs  Lab 07/31/20 0333 08/01/20 0625 08/02/20 0537 08/03/20 1217 08/04/20 0515  WBC 12.3* 7.5 6.2 6.1 10.7*  NEUTROABS  --   --  4.5 5.2 8.4*  HGB 15.7 14.1 13.8 14.6 14.3  HCT 46.6 42.6 40.6 43.8 42.8  MCV 90.0 88.0 88.6 88.0 88.8  PLT 135* 115* 128* 142* 154   Cardiac Enzymes: No results for input(s): CKTOTAL, CKMB, CKMBINDEX, TROPONINI in the last 168 hours. BNP: Invalid input(s): POCBNP CBG: Recent Labs  Lab 08/03/20 0755 08/03/20 1110  GLUCAP 133* 169*   D-Dimer No results for input(s): DDIMER in the last 72 hours. Hgb A1c No results for input(s): HGBA1C in the last 72 hours. Lipid Profile No results for input(s): CHOL, HDL, LDLCALC, TRIG, CHOLHDL, LDLDIRECT in the last 72 hours. Thyroid function studies No results for input(s): TSH, T4TOTAL, T3FREE, THYROIDAB in the last 72 hours.  Invalid input(s): FREET3 Anemia work up No results for input(s): VITAMINB12, FOLATE, FERRITIN, TIBC, IRON, RETICCTPCT in the last 72 hours. Urinalysis    Component Value Date/Time   COLORURINE AMBER (A) 03/25/2020 0450   APPEARANCEUR CLEAR 03/25/2020 0450   LABSPEC 1.023 03/25/2020 0450   PHURINE 5.0 03/25/2020 0450   GLUCOSEU NEGATIVE 03/25/2020 0450    HGBUR NEGATIVE 03/25/2020 0450   BILIRUBINUR NEGATIVE 03/25/2020 0450   BILIRUBINUR small 08/07/2015 1659   KETONESUR NEGATIVE 03/25/2020 0450   PROTEINUR 30 (A) 03/25/2020 0450   UROBILINOGEN negative 08/07/2015 1659   UROBILINOGEN 0.2 09/20/2014 2300   NITRITE NEGATIVE 03/25/2020 0450   LEUKOCYTESUR NEGATIVE 03/25/2020 0450   Sepsis Labs Invalid input(s): PROCALCITONIN,  WBC,  LACTICIDVEN Microbiology Recent Results (from the past 240 hour(s))  SARS Coronavirus 2 by RT PCR (hospital order, performed in [facility] hospital lab) Nasopharyngeal Nasopharyngeal Swab     Status: None   Collection Time: 07/31/20  1:46 PM   Specimen: Nasopharyngeal Swab  Result Value Ref Range Status   SARS Coronavirus 2 NEGATIVE NEGATIVE Final    Comment: (NOTE) SARS-CoV-2 target nucleic acids are NOT DETECTED.  The SARS-CoV-2 RNA is generally detectable in upper and lower respiratory specimens during the acute phase of infection. The lowest concentration of SARS-CoV-2 viral copies this assay can detect is 250 copies / mL. A negative result does not preclude SARS-CoV-2 infection and should not be used as the sole basis for treatment or other patient management decisions.  A negative result may occur with improper specimen collection / handling, submission of specimen other than nasopharyngeal swab, presence of viral mutation(s) within the areas targeted by this assay, and inadequate number of viral copies (<250 copies / mL). A negative result must be combined with clinical observations, patient history, and epidemiological information.  Fact Sheet for Patients:   BoilerBrush.com.cy  Fact Sheet for Healthcare Providers: https://pope.com/  This test is not yet approved or  cleared by the [place] FDA and has been authorized for detection and/or diagnosis of SARS-CoV-2 by FDA under an Emergency Use Authorization (EUA).  This EUA will remain in  effect (meaning this test can be used) for the duration of the COVID-19 declaration under Section 564(b)(1) of the Act, 21 U.S.C. section 360bbb-3(b)(1), unless the authorization is terminated or revoked sooner.  Performed at [facility], [address] [place], [place] [address]   Surgical pcr screen     Status: None   Collection Time: 08/03/20  7:43 AM   Specimen: Nasal Mucosa; Nasal Swab  Result Value Ref Range Status   MRSA, PCR NEGATIVE NEGATIVE Final   Staphylococcus aureus NEGATIVE NEGATIVE Final    Comment: (NOTE) The Xpert SA Assay (FDA approved for NASAL specimens in patients 21 years of age and older), is  one component of a comprehensive surveillance program. It is not intended to diagnose infection nor to guide or monitor treatment. Performed at [facility], [address] [place], [place] [address]      Time coordinating discharge: 35 minutes  SIGNED:   Teddy Spike, DO  [facility] 08/04/2020, 10:56 AM   If 7PM-7AM, please contact night-coverage [email or web address]

## 2020-08-06 ENCOUNTER — Telehealth: Payer: Self-pay | Admitting: Family Medicine

## 2020-08-06 ENCOUNTER — Encounter (HOSPITAL_COMMUNITY): Payer: Self-pay | Admitting: General Surgery

## 2020-08-06 ENCOUNTER — Ambulatory Visit: Payer: Medicare Other | Admitting: Family Medicine

## 2020-08-06 LAB — SURGICAL PATHOLOGY

## 2020-08-06 NOTE — Anesthesia Postprocedure Evaluation (Signed)
Anesthesia Post Note  Patient: Vincent Collins  Procedure(s) Performed: LAPAROSCOPIC CHOLECYSTECTOMY WITH INTRAOPERATIVE CHOLANGIOGRAM (N/A Abdomen)     Patient location during evaluation: PACU Anesthesia Type: General Level of consciousness: sedated and patient cooperative Pain management: pain level controlled Vital Signs Assessment: post-procedure vital signs reviewed and stable Respiratory status: spontaneous breathing Cardiovascular status: stable Anesthetic complications: no   No complications documented.  Last Vitals:  Vitals:   08/04/20 0255 08/04/20 0910  BP: (!) 157/74 (!) 153/73  Pulse: (!) 58 64  Resp: 17 20  Temp: 36.5 C 36.7 C  SpO2: 96% 97%    Last Pain:  Vitals:   08/04/20 0910  TempSrc: Oral  PainSc: 0-No pain                 Lewie Loron

## 2020-08-06 NOTE — Telephone Encounter (Signed)
TRANSITIONAL CARE MANAGEMENT TELEPHONE OUTREACH NOTE   Contact Date: 08/06/2020 Contacted By: Vincent Collins CMA   DISCHARGE INFORMATION Date of Discharge:08/04/2020 Discharge Facility: [facility] Discharge Diagnosis:Biliary acute pancreatitis with out necrosis or infection.   Outpatient Follow Up Recommendations (copied from discharge summary)   Recommendations for Outpatient Follow-up:  1. Follow up with PCP in 1 week Please obtain BMP/CBC in one week  Vincent Collins is a male primary care patient of Dettinger, Vincent Radon, MD. An outgoing telephone call was made today and I spoke with Vincent Collins his wife.  [name] condition(s) and treatment(s) were discussed. An opportunity to ask questions was provided and all were answered or forwarded as appropriate.    ACTIVITIES OF DAILY LIVING  Vincent Collins lives with their spouse and he can perform ADLs independently. his primary caregiver is his self. he is able to depend on his primary caregiver(s) for consistent help. Transportation to appointments, to pick up medications, and to run errands is not a problem.  (Consider referral to [facility] if transportation or a consistent caregiver is a problem)   Fall Risk Fall Risk  05/18/2020 04/16/2020  Falls in the past year? 1 1  Number falls in past yr: 0 0  Injury with Fall? 1 1  Comment - -  Risk for fall due to : History of fall(s) History of fall(s)  Follow up Falls evaluation completed -    medium Fall Risk   Home Modifications/Assistive Devices Wheelchair: No Cane: Yes Ramp: No Bedside Toilet: No Hospital Bed:  No Other: [facility] he is not receiving home health  services.    MEDICATION RECONCILIATION  [name] has been able to pick-up all prescribed discharge medications from the pharmacy.   A post discharge medication reconciliation was performed and the complete medication list was reviewed with the patient/caregiver and is current as of 08/06/2020.  Changes highlighted below.  Discontinued Medications   Current Medication List Allergies as of 08/06/2020   No Known Allergies     Medication List       Accurate as of August 06, 2020  9:52 AM. If you have any questions, ask your nurse or doctor.        acetaminophen 325 MG tablet Commonly known as: TYLENOL Take 2 tablets (650 mg total) by mouth every 6 (six) hours as needed for mild pain (or Fever >/= 101).   albuterol (2.5 MG/3ML) 0.083% nebulizer solution Commonly known as: PROVENTIL USE 1 VIAL VIA NEBULIZER 4  TIMES DAILY AS NEEDED FOR  WHEEZING OR SHORTNESS OF  BREATH What changed: See the new instructions.   apixaban 5 MG Tabs tablet Commonly known as: ELIQUIS Take 1 tablet (5 mg total) by mouth 2 (two) times daily.   atorvastatin 40 MG tablet Commonly known as: LIPITOR TAKE 1 TABLET BY MOUTH  DAILY   CO-Q 10 Omega-3 Fish Oil Caps Take 1 capsule by mouth in the morning. 100mg  Capsule Qunol   dicyclomine 20 MG tablet Commonly known as: BENTYL Take 20 mg by mouth 3 (three) times daily as needed for spasms.   fluticasone 50 MCG/ACT nasal spray Commonly known as: FLONASE USE 1 SPRAY INTO BOTH  NOSTRILS 2 TIMES DAILY AS  NEEDED FOR ALLERGIES OR  RHINITIS   lisinopril 20 MG tablet Commonly known as: ZESTRIL TAKE 1 TABLET BY MOUTH  DAILY   metFORMIN 500 MG 24 hr tablet Commonly known as: GLUCOPHAGE-XR TAKE 1 TABLET BY MOUTH  TWICE DAILY What changed: when to take this   metoprolol succinate 50 MG 24 hr tablet Commonly known as: TOPROL-XL TAKE 1 TABLET BY MOUTH  DAILY WITH OR IMMEDIATELY  FOLLOWING A MEAL What changed: See the new instructions.   omeprazole 20 MG capsule Commonly known as: PRILOSEC TAKE 1 CAPSULE BY MOUTH  DAILY   ondansetron 4 MG disintegrating tablet Commonly known as: ZOFRAN-ODT Take 4 mg by mouth every 8 (eight) hours as needed for nausea.   oxyCODONE 5 MG immediate release tablet Commonly known as: Oxy IR/ROXICODONE Take 1 tablet  (5 mg total) by mouth every 6 (six) hours as needed for severe pain.   tamsulosin 0.4 MG Caps capsule Commonly known as: FLOMAX TAKE 2 CAPSULES BY MOUTH  DAILY AFTER SUPPER What changed: See the new instructions.   Vitamin D 1000 units capsule Take 1,000 Units by mouth in the morning.        PATIENT EDUCATION & FOLLOW-UP PLAN  An appointment for Transitional Care Management is scheduled with Vincent Pierini FNP on 08/13/2020 at 2:15pm.  Take all medications as prescribed  Contact our office by calling [phone] if you have any questions or concerns

## 2020-08-07 ENCOUNTER — Other Ambulatory Visit: Payer: Self-pay | Admitting: Cardiovascular Disease

## 2020-08-13 ENCOUNTER — Other Ambulatory Visit: Payer: Self-pay

## 2020-08-13 ENCOUNTER — Encounter: Payer: Self-pay | Admitting: Nurse Practitioner

## 2020-08-13 ENCOUNTER — Ambulatory Visit (INDEPENDENT_AMBULATORY_CARE_PROVIDER_SITE_OTHER): Payer: Medicare Other | Admitting: Nurse Practitioner

## 2020-08-13 ENCOUNTER — Other Ambulatory Visit: Payer: Self-pay | Admitting: Cardiovascular Disease

## 2020-08-13 VITALS — BP 155/80 | HR 63 | Temp 97.8°F | Resp 20 | Ht 68.0 in | Wt 234.0 lb

## 2020-08-13 DIAGNOSIS — Z9049 Acquired absence of other specified parts of digestive tract: Secondary | ICD-10-CM

## 2020-08-13 DIAGNOSIS — Z7689 Persons encountering health services in other specified circumstances: Secondary | ICD-10-CM

## 2020-08-13 NOTE — Patient Instructions (Signed)
Cholecystostomy, Care After This sheet gives you information about how to care for yourself after your procedure. Your health care provider may also give you more specific instructions. If you have problems or questions, contact your health care provider. What can I expect after the procedure? After your procedure, it is common to have soreness near the incision site of your drainage tube (catheter). Follow these instructions at home: Incision care   Follow instructions from your health care provider about how to take care of your incision site where the catheter was inserted. Make sure you: ? Wash your hands with soap and water before and after you change your bandage (dressing). If soap and water are not available, use hand sanitizer. ? Change your dressing as told by your health care provider.  Check the incision site every day for signs of infection. Check for: ? Redness, swelling, or pain. ? Fluid or blood. ? Warmth. ? Pus or a bad smell.  Do not take baths, swim, or use a hot tub until your health care provider approves. Ask your health care provider if you may take showers. You may only be allowed to take sponge baths. General instructions  Follow instructions from your health care provider about how to care for your catheter and collection bag at home.  Your health care provider will show you: ? How to record the amount of drainage from the catheter. ? How to flush the catheter. ? How to care for the catheter incision site.  Follow instructions from your health care provider about eating or drinking restrictions.  Take over-the-counter and prescription medicines only as told by your health care provider.  Keep all follow-up visits as told by your health care provider. This is important. Contact a health care provider if:  You have redness, swelling, or pain around the catheter incision site.  You have nausea or vomiting. Get help right away if:  Your abdominal pain  gets worse.  You feel dizzy or you faint while standing.  You have fluid or blood coming from the catheter incision site.  The area around the catheter incision site feels warm to the touch.  You have pus or a bad smell coming from the catheter incision site.  You have a fever.  You have shortness of breath.  You have a rapid heartbeat.  Your nausea or vomiting does not go away.  Your catheter becomes blocked.  Your catheter comes out of your abdomen. Summary  After your procedure, it is common to have soreness near the incision site of your drainage tube (catheter).  Wash your hands with soap and water before and after you change your bandage (dressing). Change your dressing as told by your health care provider.  Check the catheter incision site every day for signs of infection. Check for redness, swelling, pain, fluid, blood, warmth, pus, or a bad smell.  Contact your health care provider if you have nausea or vomiting, or if you have redness, swelling, or pain around your catheter incision site.  Get help right away if your abdominal pain gets worse, you feel dizzy, you have blood or fluid coming from the catheter incision site, you have a fever, or you have shortness of breath. This information is not intended to replace advice given to you by your health care provider. Make sure you discuss any questions you have with your health care provider. Document Revised: 07/12/2018 Document Reviewed: 07/12/2018 Elsevier Patient Education  2020 Elsevier Inc.  

## 2020-08-13 NOTE — Progress Notes (Signed)
Subjective:    Patient ID: Vincent Collins, male    DOB: November 22, 1941, 79 y.o.   MRN: [id number]  Today's visit was for Transitional Care Management.  The patient was discharged from [facility] on 08/04/20 with a primary diagnosis of biliary acute pancreatitis without necrosis or infection.   Contact with the patient and/or caregiver, by a clinical staff member, was made on 08/06/20 and was documented as a telephone encounter within the EMR.  Through chart review and discussion with the patient I have determined that management of their condition is of moderate complexity.   He had his gallbladder taken out on 08/03/20. He is doing much better. They think that was the cause of his pancreatitis. He says he feels much better. Denies any pain, nausea of vomiting. He has a follow up with surgeon on 08/23/20      Review of Systems  Constitutional: Negative for diaphoresis.  Eyes: Negative for pain.  Respiratory: Negative for shortness of breath.   Cardiovascular: Negative for chest pain, palpitations and leg swelling.  Gastrointestinal: Negative for abdominal pain.  Endocrine: Negative for polydipsia.  Skin: Negative for rash.  Neurological: Negative for dizziness, weakness and headaches.  Hematological: Does not bruise/bleed easily.  All other systems reviewed and are negative.      Objective:   Physical Exam Vitals and nursing note reviewed.  Constitutional:      Appearance: Normal appearance. He is well-developed.  HENT:     Head: Normocephalic.     Nose: Nose normal.  Eyes:     Pupils: Pupils are equal, round, and reactive to light.  Neck:     Thyroid: No thyroid mass or thyromegaly.     Vascular: No carotid bruit or JVD.     Trachea: Phonation normal.  Cardiovascular:     Rate and Rhythm: Normal rate and regular rhythm.  Pulmonary:     Effort: Pulmonary effort is normal. No respiratory distress.     Breath sounds: Normal breath sounds.  Abdominal:     General: Bowel sounds  are normal.     Palpations: Abdomen is soft.     Tenderness: There is no abdominal tenderness.  Musculoskeletal:        General: Normal range of motion.     Cervical back: Normal range of motion and neck supple.  Lymphadenopathy:     Cervical: No cervical adenopathy.  Skin:    General: Skin is warm and dry.  Neurological:     Mental Status: He is alert and oriented to person, place, and time.  Psychiatric:        Behavior: Behavior normal.        Thought Content: Thought content normal.        Judgment: Judgment normal.    BP (!) 155/80   Pulse 63   Temp 97.8 F (36.6 C) (Temporal)   Resp 20   Ht 5\' 8"  (1.727 m)   Wt 234 lb (106.1 kg)   SpO2 94%   BMI 35.58 kg/m        Assessment & Plan:  in today with chief complaint of Transitions Of Care   1. S/P laparoscopic cholecystectomy Keep follow up appointment with surgeon Avoid heavy lifting or straining  2. Encounter for support and coordination of transition of care Hospital records reviewed    The above assessment and management plan was discussed with the patient. The patient verbalized understanding of and has agreed to the management plan. Patient is  aware to call the clinic if symptoms persist or worsen. Patient is aware when to return to the clinic for a follow-up visit. Patient educated on when it is appropriate to go to the emergency department.   Mary-Margaret Hassell Done, FNP

## 2020-08-19 ENCOUNTER — Other Ambulatory Visit: Payer: Self-pay | Admitting: Family

## 2020-08-19 DIAGNOSIS — R351 Nocturia: Secondary | ICD-10-CM

## 2020-09-05 ENCOUNTER — Other Ambulatory Visit: Payer: Self-pay

## 2020-09-07 ENCOUNTER — Other Ambulatory Visit: Payer: Self-pay

## 2020-11-21 ENCOUNTER — Other Ambulatory Visit: Payer: Self-pay

## 2020-11-21 ENCOUNTER — Other Ambulatory Visit: Payer: Self-pay | Admitting: Family

## 2020-11-21 ENCOUNTER — Other Ambulatory Visit: Payer: Self-pay | Admitting: Family Medicine

## 2020-11-21 DIAGNOSIS — K2950 Unspecified chronic gastritis without bleeding: Secondary | ICD-10-CM

## 2020-11-21 DIAGNOSIS — I152 Hypertension secondary to endocrine disorders: Secondary | ICD-10-CM

## 2020-11-21 DIAGNOSIS — J019 Acute sinusitis, unspecified: Secondary | ICD-10-CM

## 2020-11-21 MED ORDER — METFORMIN HCL ER 500 MG PO TB24: 500.0000 mg | ORAL_TABLET | Freq: Two times a day (BID) | ORAL | 0 refills | Status: DC

## 2020-11-21 NOTE — Telephone Encounter (Signed)
  Prescription Request  11/21/2020  What is the name of the medication or equipment? [facility]/ He is still is waiting on mail order  Have you contacted your pharmacy to request a refill? (if applicable) yes  Which pharmacy would you like this sent to? [facility]   Patient notified that their request is being sent to the clinical staff for review and that they should receive a response within 2 business days.

## 2020-11-21 NOTE — Telephone Encounter (Signed)
30 day supply sent patient aware. 

## 2020-12-02 NOTE — Progress Notes (Signed)
Cardiology Office Note:   Date:  12/03/2020  NAME:  Vincent Collins    MRN: [id number] DOB:  1941-12-08   PCP:  Dettinger, Elige Radon, MD  Cardiologist:  Reatha Harps, MD   Referring MD: Dettinger, Elige Radon, MD   Chief Complaint  Patient presents with  . Atrial Fibrillation    History of Present Illness:   Vincent Collins is a 79 y.o. male with a hx of persistent Afib, DM, HTN, COPD who presents for follow-up. Readmitted with cholecystitis in August and is s/p cholecystectomy.  EKG today shows he is maintaining sinus rhythm.  No recurrence of atrial fibrillation.  No bleeding issues on Eliquis 5 mg twice daily.  Weight is down in the 230 pound range.  Seems to be doing well since his gallbladder surgery.  He denies any chest pain, shortness of breath or palpitations.  Blood pressure is a bit elevated today 156/82.  Apparently it runs much lower at home.  He will start to check it more often.  Has not had any issues from a cardiovascular standpoint that I can tell.  His LDL cholesterol from 2019 was at goal.  He is diabetic with an A1c of 6.8.  He is on Lipitor 40 mg a day.  He will see his primary care physician later this week for annual labs.  We did discuss Jardiance as this will help his diabetes with cost appears to be an issue.  He cannot afford Eliquis but I wonder about Jardiance.  He will hold on this for now.  Problem List 1. Persistent Afib -diagnosed 03/27/2020 2/2 acute pancreatitis -back in NSR with short course of amiodarone  -CHADSVASC = 4 (DM, HTN, age) 2. DM -A1c 6.9 3. HTN 4. COPD  Past Medical History: Past Medical History:  Diagnosis Date  . Arrhythmia   . Broken leg and ribs, right, closed, initial encounter   . COPD (chronic obstructive pulmonary disease) (HCC)   . Diabetes mellitus without complication (HCC)   . Diverticulitis   . GERD (gastroesophageal reflux disease)   . Hydronephrosis of left kidney   . Hyperlipidemia   . Hypertension   . Left ureteral  stone    obstructing  . Renal calculus or stone 2015    Past Surgical History: Past Surgical History:  Procedure Laterality Date  . 2d echo  04/04/09  . CHOLECYSTECTOMY N/A 08/03/2020   Procedure: LAPAROSCOPIC CHOLECYSTECTOMY WITH INTRAOPERATIVE CHOLANGIOGRAM;  Surgeon: Gaynelle Adu, MD;  Location: [facility] OR;  Service: General;  Laterality: N/A;  . CYSTOSCOPY WITH RETROGRADE PYELOGRAM, URETEROSCOPY AND STENT PLACEMENT Left 10/11/2014   Procedure: CYSTOSCOPY WITH RETROGRADE PYELOGRAM, DIAGNOSTIC URETEROSCOPY AND STENT PLACEMENT;  Surgeon: [name], MD;  Location: [facility];  Service: Urology;  Laterality: Left;  . LUMBAR DISC SURGERY    . SPINE SURGERY      Current Medications: Current Meds  Medication Sig  . acetaminophen (TYLENOL) 325 MG tablet Take 2 tablets (650 mg total) by mouth every 6 (six) hours as needed for mild pain (or Fever >/= 101).  Marland Kitchen albuterol (PROVENTIL) (2.5 MG/3ML) 0.083% nebulizer solution USE 1 VIAL VIA NEBULIZER 4  TIMES DAILY AS NEEDED FOR  WHEEZING OR SHORTNESS OF  BREATH (Patient taking differently: Take 2.5 mg by nebulization 4 (four) times daily as needed for wheezing or shortness of breath. )  . apixaban (ELIQUIS) 5 MG TABS tablet Take 1 tablet (5 mg total) by mouth 2 (two) times daily.  Marland Kitchen atorvastatin (LIPITOR) 40  MG tablet TAKE 1 TABLET BY MOUTH  DAILY  . Cholecalciferol (VITAMIN D) 1000 UNITS capsule Take 1,000 Units by mouth in the morning.   . Coenzyme Q10-Fish Oil-Vit E (CO-Q 10 OMEGA-3 FISH OIL) CAPS Take 1 capsule by mouth in the morning. 100mg  Capsule Qunol  . fluticasone (FLONASE) 50 MCG/ACT nasal spray USE 1 SPRAY INTO BOTH  NOSTRILS 2 TIMES DAILY AS  NEEDED FOR ALLERGIES OR  RHINITIS  . lisinopril (ZESTRIL) 20 MG tablet TAKE 1 TABLET BY MOUTH  DAILY  . metFORMIN (GLUCOPHAGE-XR) 500 MG 24 hr tablet TAKE 1 TABLET BY MOUTH  TWICE DAILY  . metoprolol succinate (TOPROL-XL) 50 MG 24 hr tablet TAKE 1 TABLET BY MOUTH  DAILY WITH OR  IMMEDIATELY  FOLLOWING A MEAL  . omeprazole (PRILOSEC) 20 MG capsule TAKE 1 CAPSULE BY MOUTH  DAILY  . tamsulosin (FLOMAX) 0.4 MG CAPS capsule TAKE 2 CAPSULES BY MOUTH  DAILY AFTER SUPPER  . [DISCONTINUED] ELIQUIS 5 MG TABS tablet Take 1 tablet by mouth twice daily     Allergies:    Patient has no known allergies.   Social History: Social History   Socioeconomic History  . Marital status: Married    Spouse name: Not on file  . Number of children: 2  . Years of education: 8th  . Highest education level: 8th grade  Occupational History  . Not on file  Tobacco Use  . Smoking status: Former Smoker    Types: Cigarettes    Quit date: 06/18/1967    Years since quitting: 53.4  . Smokeless tobacco: Former NeurosurgeonUser    Types: Engineer, drillingChew  Vaping Use  . Vaping Use: Never used  Substance and Sexual Activity  . Alcohol use: No  . Drug use: No  . Sexual activity: Not Currently  Other Topics Concern  . Not on file  Social History Narrative  . Not on file   Social Determinants of Health   Financial Resource Strain: Low Risk   . Difficulty of Paying Living Expenses: Not hard at all  Food Insecurity: No Food Insecurity  . Worried About Programme researcher, broadcasting/film/videounning Out of Food in the Last Year: Never true  . Ran Out of Food in the Last Year: Never true  Transportation Needs: No Transportation Needs  . Lack of Transportation (Medical): No  . Lack of Transportation (Non-Medical): No  Physical Activity: Inactive  . Days of Exercise per Week: 0 days  . Minutes of Exercise per Session: 0 min  Stress: No Stress Concern Present  . Feeling of Stress : Not at all  Social Connections: Moderately Integrated  . Frequency of Communication with Friends and Family: More than three times a week  . Frequency of Social Gatherings with Friends and Family: Twice a week  . Attends Religious Services: More than 4 times per year  . Active Member of Clubs or Organizations: No  . Attends BankerClub or Organization Meetings: Never  . Marital  Status: Married     Family History: The patient's family history includes COPD in his brother; Cancer in his sister; Early death in his father; Heart attack in his father.  ROS:   All other ROS reviewed and negative. Pertinent positives noted in the HPI.     EKGs/Labs/Other Studies Reviewed:   The following studies were personally reviewed by me today:  EKG:  EKG is ordered today.  The ekg ordered today demonstrates normal sinus rhythm, heart rate 59, no acute ischemic changes, no evidence of prior infarction, and was  personally reviewed by me.   Recent Labs: 03/28/2020: B Natriuretic Peptide 187.9 08/04/2020: ALT 160; BUN 10; Creatinine, Ser 1.18; Hemoglobin 14.3; Magnesium 1.7; Platelets 154; Potassium 3.5; Sodium 135   Recent Lipid Panel    Component Value Date/Time   CHOL 117 08/18/2018 1143   TRIG 76 03/25/2020 0839   TRIG 97 11/02/2013 0949   HDL 37 (L) 08/18/2018 1143   HDL 40 11/02/2013 0949   CHOLHDL 3.2 08/18/2018 1143   CHOLHDL 2.4 09/21/2014 0620   VLDL 21 09/21/2014 0620   LDLCALC 59 08/18/2018 1143   LDLCALC 59 11/02/2013 0949    Physical Exam:   VS:  BP (!) 156/82   Pulse (!) 59   Ht 5\' 8"  (1.727 m)   Wt 237 lb 3.2 oz (107.6 kg)   SpO2 96%   BMI 36.07 kg/m    Wt Readings from Last 3 Encounters:  12/03/20 237 lb 3.2 oz (107.6 kg)  08/13/20 234 lb (106.1 kg)  08/04/20 243 lb 6.4 oz (110.4 kg)    General: Well nourished, well developed, in no acute distress Heart: Atraumatic, normal size  Eyes: PEERLA, EOMI  Neck: Supple, no JVD Endocrine: No thryomegaly Cardiac: Normal S1, S2; RRR; no murmurs, rubs, or gallops Lungs: Clear to auscultation bilaterally, no wheezing, rhonchi or rales  Abd: Soft, nontender, no hepatomegaly  Ext: No edema, pulses 2+ Musculoskeletal: No deformities, BUE and BLE strength normal and equal Skin: Warm and dry, no rashes   Neuro: Alert and oriented to person, place, time, and situation, CNII-XII grossly intact, no focal  deficits  Psych: Normal mood and affect   ASSESSMENT:   SYRIS BROOKENS is a 79 y.o. male who presents for the following: 1. Persistent atrial fibrillation (HCC)   2. Adequate anticoagulation on anticoagulant therapy   3. Essential hypertension   4. Mixed hyperlipidemia     PLAN:   1. Persistent atrial fibrillation (HCC) 2. Adequate anticoagulation on anticoagulant therapy -He had atrial fibrillation in the setting of critical illness earlier this year.  He did have recurrence.  He completed a course of amiodarone and has had no recurrence of A. fib.  He is on Eliquis 5 mg twice daily due to his high chads vas score.  No issues with this.  We will continue for now.  3. Essential hypertension -Blood pressure a bit elevated.  I want him to keep a log.  He is only on minimal blood pressure medications.  Doses can be increased if needed.  4. Mixed hyperlipidemia -He is diabetic.  Most recent LDL cholesterol at goal.  Continue Lipitor 40 mg a day.  His LDL cholesterol should be less than 70.  Disposition: Return in about 1 year (around 12/03/2021).  Medication Adjustments/Labs and Tests Ordered: Current medicines are reviewed at length with the patient today.  Concerns regarding medicines are outlined above.  Orders Placed This Encounter  Procedures  . EKG 12-Lead   Meds ordered this encounter  Medications  . apixaban (ELIQUIS) 5 MG TABS tablet    Sig: Take 1 tablet (5 mg total) by mouth 2 (two) times daily.    Dispense:  180 tablet    Refill:  3    Patient Instructions  Medication Instructions:  Continue same medications *If you need a refill on your cardiac medications before your next appointment, please call your pharmacy*   Lab Work: None ordered   Testing/Procedures: None ordered   Follow-Up: At [facility], you and your health needs are our  priority.  As part of our continuing mission to provide you with exceptional heart care, we have created designated  Provider Care Teams.  These Care Teams include your primary Cardiologist (physician) and Advanced Practice Providers (APPs -  Physician Assistants and Nurse Practitioners) who all work together to provide you with the care you need, when you need it.  We recommend signing up for the patient portal called "MyChart".  Sign up information is provided on this After Visit Summary.  MyChart is used to connect with patients for Virtual Visits (Telemedicine).  Patients are able to view lab/test results, encounter notes, upcoming appointments, etc.  Non-urgent messages can be sent to your provider as well.   To learn more about what you can do with MyChart, go to [email or web address].    Your next appointment:  1 year    Call in Sept to schedule Dec appointment    The format for your next appointment: Office   Provider:  Dr.O'Neal      Time Spent with Patient: I have spent a total of 25 minutes with patient reviewing hospital notes, telemetry, EKGs, labs and examining the patient as well as establishing an assessment and plan that was discussed with the patient.  > 50% of time was spent in direct patient care.  Signed, Lenna Gilford. Flora Lipps, MD [facility]  [address] [place], [place] [address] [phone]  12/03/2020 9:45 AM

## 2020-12-03 ENCOUNTER — Ambulatory Visit: Payer: Medicare Other | Admitting: Cardiovascular Disease

## 2020-12-03 ENCOUNTER — Encounter: Payer: Self-pay | Admitting: Cardiovascular Disease

## 2020-12-03 ENCOUNTER — Other Ambulatory Visit: Payer: Self-pay

## 2020-12-03 VITALS — BP 156/82 | HR 59 | Ht 68.0 in | Wt 237.2 lb

## 2020-12-03 DIAGNOSIS — I10 Essential (primary) hypertension: Secondary | ICD-10-CM

## 2020-12-03 DIAGNOSIS — I4819 Other persistent atrial fibrillation: Secondary | ICD-10-CM

## 2020-12-03 DIAGNOSIS — Z7901 Long term (current) use of anticoagulants: Secondary | ICD-10-CM

## 2020-12-03 DIAGNOSIS — E782 Mixed hyperlipidemia: Secondary | ICD-10-CM | POA: Diagnosis not present

## 2020-12-03 MED ORDER — APIXABAN 5 MG PO TABS: 5.0000 mg | ORAL_TABLET | Freq: Two times a day (BID) | ORAL | 3 refills | Status: DC

## 2020-12-03 NOTE — Patient Instructions (Signed)
Medication Instructions:  Continue same medications *If you need a refill on your cardiac medications before your next appointment, please call your pharmacy*   Lab Work: None ordered  Testing/Procedures: None ordered   Follow-Up: At [facility], you and your health needs are our priority.  As part of our continuing mission to provide you with exceptional heart care, we have created designated Provider Care Teams.  These Care Teams include your primary Cardiologist (physician) and Advanced Practice Providers (APPs -  Physician Assistants and Nurse Practitioners) who all work together to provide you with the care you need, when you need it.  We recommend signing up for the patient portal called "MyChart".  Sign up information is provided on this After Visit Summary.  MyChart is used to connect with patients for Virtual Visits (Telemedicine).  Patients are able to view lab/test results, encounter notes, upcoming appointments, etc.  Non-urgent messages can be sent to your provider as well.   To learn more about what you can do with MyChart, go to [email or web address].    Your next appointment: 1 year  Call in Sept to schedule Dec appointment     The format for your next appointment: Office    Provider: Dr.O'Neal     

## 2020-12-06 ENCOUNTER — Encounter: Payer: Self-pay | Admitting: Family Medicine

## 2020-12-06 ENCOUNTER — Ambulatory Visit (INDEPENDENT_AMBULATORY_CARE_PROVIDER_SITE_OTHER): Payer: Medicare Other | Admitting: Family Medicine

## 2020-12-06 ENCOUNTER — Other Ambulatory Visit: Payer: Self-pay

## 2020-12-06 VITALS — BP 161/72 | HR 58 | Ht 68.0 in | Wt 237.0 lb

## 2020-12-06 DIAGNOSIS — E785 Hyperlipidemia, unspecified: Secondary | ICD-10-CM | POA: Diagnosis not present

## 2020-12-06 DIAGNOSIS — Z23 Encounter for immunization: Secondary | ICD-10-CM | POA: Diagnosis not present

## 2020-12-06 DIAGNOSIS — E1169 Type 2 diabetes mellitus with other specified complication: Secondary | ICD-10-CM | POA: Diagnosis not present

## 2020-12-06 DIAGNOSIS — I10 Essential (primary) hypertension: Secondary | ICD-10-CM

## 2020-12-06 LAB — BAYER DCA HB A1C WAIVED: HB A1C (BAYER DCA - WAIVED): 6 % (ref <7.0)

## 2020-12-06 MED ORDER — METFORMIN HCL ER 500 MG PO TB24: 500.0000 mg | ORAL_TABLET | Freq: Two times a day (BID) | ORAL | 3 refills | Status: DC

## 2020-12-06 NOTE — Progress Notes (Signed)
BP (!) 161/72   Pulse (!) 58   Ht 5\' 8"  (1.727 m)   Wt 237 lb (107.5 kg)   SpO2 95%   BMI 36.04 kg/m    Subjective:   Patient ID: Vincent Collins, male    DOB: December 08, 1941, 79 y.o.   MRN: [id number]  HPI: Vincent Collins is a 79 y.o. male presenting on 12/06/2020 for Medical Management of Chronic Issues and Diabetes   HPI Type 2 diabetes mellitus Patient comes in today for recheck of his diabetes. Patient has been currently taking Metformin, A1c is 6.0 today. Patient is currently on an ACE inhibitor/ARB. Patient has seen an ophthalmologist this year. Patient denies any issues with their feet. The symptom started onset as an adult hyperlipidemia hypertension ARE RELATED TO DM   Hyperlipidemia Patient is coming in for recheck of his hyperlipidemia. The patient is currently taking atorvastatin. They deny any issues with myalgias or history of liver damage from it. They deny any focal numbness or weakness or chest pain.   Hypertension Patient is currently on  lisinopril and metoprolol, and their blood pressure today is 161/72 but he says he checked it this morning at home and it was 130s over 70s, he says he always runs 130s over 70s at home, he will keep those numbers and bring them in next time.. Patient denies any lightheadedness or dizziness. Patient denies headaches, blurred vision, chest pains, shortness of breath, or weakness. Denies any side effects from medication and is content with current medication.   Patient had laparoscopic cholecystectomy recently and feels like a new man and feels like things have been going very well since that.  Relevant past medical, surgical, family and social history reviewed and updated as indicated. Interim medical history since our last visit reviewed. Allergies and medications reviewed and updated.  Review of Systems  Constitutional: Negative for chills and fever.  Eyes: Negative for visual disturbance.  Respiratory: Negative for shortness of  breath and wheezing.   Cardiovascular: Negative for chest pain and leg swelling.  Musculoskeletal: Negative for back pain and gait problem.  Skin: Negative for rash.  Neurological: Negative for dizziness, weakness and light-headedness.  All other systems reviewed and are negative.   Per HPI unless specifically indicated above   Allergies as of 12/06/2020   No Known Allergies     Medication List       Accurate as of December 06, 2020  2:22 PM. If you have any questions, ask your nurse or doctor.        acetaminophen 325 MG tablet Commonly known as: TYLENOL Take 2 tablets (650 mg total) by mouth every 6 (six) hours as needed for mild pain (or Fever >/= 101).   albuterol (2.5 MG/3ML) 0.083% nebulizer solution Commonly known as: PROVENTIL USE 1 VIAL VIA NEBULIZER 4  TIMES DAILY AS NEEDED FOR  WHEEZING OR SHORTNESS OF  BREATH What changed: See the new instructions.   apixaban 5 MG Tabs tablet Commonly known as: Eliquis Take 1 tablet (5 mg total) by mouth 2 (two) times daily.   atorvastatin 40 MG tablet Commonly known as: LIPITOR TAKE 1 TABLET BY MOUTH  DAILY   CO-Q 10 Omega-3 Fish Oil Caps Take 1 capsule by mouth in the morning. 100mg  Capsule Qunol   dicyclomine 20 MG tablet Commonly known as: BENTYL Take 20 mg by mouth 3 (three) times daily as needed for spasms.   fluticasone 50 MCG/ACT nasal spray Commonly known as: FLONASE USE 1 SPRAY  INTO BOTH  NOSTRILS 2 TIMES DAILY AS  NEEDED FOR ALLERGIES OR  RHINITIS   lisinopril 20 MG tablet Commonly known as: ZESTRIL TAKE 1 TABLET BY MOUTH  DAILY   metFORMIN 500 MG 24 hr tablet Commonly known as: GLUCOPHAGE-XR Take 1 tablet (500 mg total) by mouth 2 (two) times daily.   metoprolol succinate 50 MG 24 hr tablet Commonly known as: TOPROL-XL TAKE 1 TABLET BY MOUTH  DAILY WITH OR IMMEDIATELY  FOLLOWING A MEAL   omeprazole 20 MG capsule Commonly known as: PRILOSEC TAKE 1 CAPSULE BY MOUTH  DAILY   tamsulosin 0.4 MG Caps  capsule Commonly known as: FLOMAX TAKE 2 CAPSULES BY MOUTH  DAILY AFTER SUPPER   Vitamin D 1000 units capsule Take 1,000 Units by mouth in the morning.        Objective:   BP (!) 161/72   Pulse (!) 58   Ht 5\' 8"  (1.727 m)   Wt 237 lb (107.5 kg)   SpO2 95%   BMI 36.04 kg/m   Wt Readings from Last 3 Encounters:  12/06/20 237 lb (107.5 kg)  12/03/20 237 lb 3.2 oz (107.6 kg)  08/13/20 234 lb (106.1 kg)    Physical Exam Vitals and nursing note reviewed.  Constitutional:      General: He is not in acute distress.    Appearance: He is well-developed and well-nourished. He is not diaphoretic.  Eyes:     General: No scleral icterus.    Extraocular Movements: EOM normal.     Conjunctiva/sclera: Conjunctivae normal.  Neck:     Thyroid: No thyromegaly.  Cardiovascular:     Rate and Rhythm: Normal rate and regular rhythm.     Pulses: Intact distal pulses.     Heart sounds: Normal heart sounds. No murmur heard.   Pulmonary:     Effort: Pulmonary effort is normal. No respiratory distress.     Breath sounds: Normal breath sounds. No wheezing.  Musculoskeletal:        General: No edema. Normal range of motion.     Cervical back: Neck supple.  Lymphadenopathy:     Cervical: No cervical adenopathy.  Skin:    General: Skin is warm and dry.     Findings: No rash.  Neurological:     Mental Status: He is alert and oriented to person, place, and time.     Coordination: Coordination normal.  Psychiatric:        Mood and Affect: Mood and affect normal.        Behavior: Behavior normal.       Assessment & Plan:   Problem List Items Addressed This Visit      Cardiovascular and Mediastinum   Essential hypertension     Endocrine   Hyperlipidemia associated with type 2 diabetes mellitus (HCC)   Relevant Medications   metFORMIN (GLUCOPHAGE-XR) 500 MG 24 hr tablet   Type 2 diabetes mellitus (HCC) - Primary   Relevant Medications   metFORMIN (GLUCOPHAGE-XR) 500 MG 24 hr  tablet   Other Relevant Orders   Bayer DCA Hb A1c Waived      Continue current medication, A1c looks good, no changes, will monitor blood pressure closely at home and bring in the logs. Follow up plan: Return in about 3 months (around 03/06/2021), or if symptoms worsen or fail to improve, for Hypertension and diabetes.  Counseling provided for all of the vaccine components Orders Placed This Encounter  Procedures  . Bayer DCA Hb A1c Waived  Arville Care, MD [facility] 12/06/2020, 2:22 PM

## 2020-12-06 NOTE — Addendum Note (Signed)
Addended by: Dorene Sorrow on: 12/06/2020 04:49 PM   Modules accepted: Orders

## 2020-12-07 NOTE — Addendum Note (Signed)
Addended by: Dorene Sorrow on: 12/07/2020 08:40 AM   Modules accepted: Orders

## 2021-01-22 ENCOUNTER — Other Ambulatory Visit: Payer: Self-pay | Admitting: Family Medicine

## 2021-01-22 DIAGNOSIS — R351 Nocturia: Secondary | ICD-10-CM

## 2021-01-22 DIAGNOSIS — N401 Enlarged prostate with lower urinary tract symptoms: Secondary | ICD-10-CM

## 2021-01-31 ENCOUNTER — Ambulatory Visit (INDEPENDENT_AMBULATORY_CARE_PROVIDER_SITE_OTHER): Payer: Medicare Other | Admitting: Family Medicine

## 2021-01-31 ENCOUNTER — Encounter: Payer: Self-pay | Admitting: Family Medicine

## 2021-01-31 ENCOUNTER — Other Ambulatory Visit: Payer: Self-pay

## 2021-01-31 VITALS — BP 183/90 | HR 60 | Ht 68.0 in | Wt 238.5 lb

## 2021-01-31 DIAGNOSIS — N401 Enlarged prostate with lower urinary tract symptoms: Secondary | ICD-10-CM

## 2021-01-31 DIAGNOSIS — E1169 Type 2 diabetes mellitus with other specified complication: Secondary | ICD-10-CM | POA: Diagnosis not present

## 2021-01-31 DIAGNOSIS — I4819 Other persistent atrial fibrillation: Secondary | ICD-10-CM | POA: Insufficient documentation

## 2021-01-31 DIAGNOSIS — J449 Chronic obstructive pulmonary disease, unspecified: Secondary | ICD-10-CM | POA: Diagnosis not present

## 2021-01-31 DIAGNOSIS — K2950 Unspecified chronic gastritis without bleeding: Secondary | ICD-10-CM

## 2021-01-31 DIAGNOSIS — R351 Nocturia: Secondary | ICD-10-CM

## 2021-01-31 DIAGNOSIS — E785 Hyperlipidemia, unspecified: Secondary | ICD-10-CM

## 2021-01-31 DIAGNOSIS — I10 Essential (primary) hypertension: Secondary | ICD-10-CM

## 2021-01-31 LAB — CMP14+EGFR
ALT: 12 IU/L (ref 0–44)
AST: 16 IU/L (ref 0–40)
Albumin/Globulin Ratio: 2.4 — ABNORMAL HIGH (ref 1.2–2.2)
Albumin: 4.1 g/dL (ref 3.7–4.7)
Alkaline Phosphatase: 92 IU/L (ref 44–121)
BUN/Creatinine Ratio: 14 (ref 10–24)
BUN: 14 mg/dL (ref 8–27)
Bilirubin Total: 1.1 mg/dL (ref 0.0–1.2)
CO2: 24 mmol/L (ref 20–29)
Calcium: 9 mg/dL (ref 8.6–10.2)
Chloride: 103 mmol/L (ref 96–106)
Creatinine, Ser: 1.03 mg/dL (ref 0.76–1.27)
GFR calc Af Amer: 80 mL/min/{1.73_m2} (ref >59)
GFR calc non Af Amer: 69 mL/min/{1.73_m2} (ref >59)
Globulin, Total: 1.7 g/dL (ref 1.5–4.5)
Glucose: 134 mg/dL — ABNORMAL HIGH (ref 65–99)
Potassium: 4.1 mmol/L (ref 3.5–5.2)
Sodium: 142 mmol/L (ref 134–144)
Total Protein: 5.8 g/dL — ABNORMAL LOW (ref 6.0–8.5)

## 2021-01-31 LAB — CBC WITH DIFFERENTIAL/PLATELET
Basophils Absolute: 0 10*3/uL (ref 0.0–0.2)
Basos: 1 %
EOS (ABSOLUTE): 0.2 10*3/uL (ref 0.0–0.4)
Eos: 2 %
Hematocrit: 46.5 % (ref 37.5–51.0)
Hemoglobin: 16.2 g/dL (ref 13.0–17.7)
Immature Grans (Abs): 0 10*3/uL (ref 0.0–0.1)
Immature Granulocytes: 0 %
Lymphocytes Absolute: 2.1 10*3/uL (ref 0.7–3.1)
Lymphs: 25 %
MCH: 30.9 pg (ref 26.6–33.0)
MCHC: 34.8 g/dL (ref 31.5–35.7)
MCV: 89 fL (ref 79–97)
Monocytes Absolute: 0.8 10*3/uL (ref 0.1–0.9)
Monocytes: 10 %
Neutrophils Absolute: 5.2 10*3/uL (ref 1.4–7.0)
Neutrophils: 62 %
Platelets: 167 10*3/uL (ref 150–450)
RBC: 5.25 x10E6/uL (ref 4.14–5.80)
RDW: 12.7 % (ref 11.6–15.4)
WBC: 8.4 10*3/uL (ref 3.4–10.8)

## 2021-01-31 LAB — LIPID PANEL
Chol/HDL Ratio: 2.9 ratio (ref 0.0–5.0)
Cholesterol, Total: 102 mg/dL (ref 100–199)
HDL: 35 mg/dL — ABNORMAL LOW (ref >39)
LDL Chol Calc (NIH): 48 mg/dL (ref 0–99)
Triglycerides: 101 mg/dL (ref 0–149)
VLDL Cholesterol Cal: 19 mg/dL (ref 5–40)

## 2021-01-31 LAB — BAYER DCA HB A1C WAIVED: HB A1C (BAYER DCA - WAIVED): 6.4 % (ref <7.0)

## 2021-01-31 MED ORDER — OMEPRAZOLE 20 MG PO CPDR: 20.0000 mg | DELAYED_RELEASE_CAPSULE | Freq: Every day | ORAL | 3 refills | Status: DC

## 2021-01-31 MED ORDER — ATORVASTATIN CALCIUM 40 MG PO TABS: 40.0000 mg | ORAL_TABLET | Freq: Every day | ORAL | 3 refills | Status: DC

## 2021-01-31 MED ORDER — TAMSULOSIN HCL 0.4 MG PO CAPS: ORAL_CAPSULE | ORAL | 3 refills | Status: DC

## 2021-01-31 NOTE — Progress Notes (Signed)
BP (!) 183/90   Pulse 60   Ht $R'5\' 8"'Pp$  (1.727 m)   Wt 238 lb 8 oz (108.2 kg)   SpO2 96%   BMI 36.26 kg/m    Subjective:   Patient ID: Vincent Collins, male    DOB: 1941-01-14, 80 y.o.   MRN: [id number]  HPI: Vincent Collins is a 80 y.o. male presenting on 01/31/2021 for Medical Management of Chronic Issues   HPI Type 2 diabetes mellitus Patient comes in today for recheck of his diabetes. Patient has been currently taking Metformin, A1c is 6.4. Patient is currently on an ACE inhibitor/ARB. Patient has seen an ophthalmologist this year. Patient denies any issues with their feet. The symptom started onset as an adult hypertension and hyperlipidemia ARE RELATED TO DM   Hyperlipidemia Patient is coming in for recheck of his hyperlipidemia. The patient is currently taking atorvastatin. They deny any issues with myalgias or history of liver damage from it. They deny any focal numbness or weakness or chest pain.   Hypertension Patient is currently on lisinopril, and their blood pressure today is 183/90 and 172/76 and 168/92, he says at home his blood pressure when he checked it this morning was 143/76. Patient denies any lightheadedness or dizziness. Patient denies headaches, blurred vision, chest pains, shortness of breath, or weakness. Denies any side effects from medication and is content with current medication.   COPD Patient is coming in for COPD recheck today.  He is currently on albuterol and Flonase.  He has a mild chronic cough but denies any major coughing spells or wheezing spells.  He has 0nighttime symptoms per week and 0daytime symptoms per week currently.   Patient has A. fib and sees cardiology for this, seems to be regular rhythm today regular rate.  He is on Eliquis and denies any bleeding  Relevant past medical, surgical, family and social history reviewed and updated as indicated. Interim medical history since our last visit reviewed. Allergies and medications reviewed and  updated.  Review of Systems  Constitutional: Negative for chills and fever.  Eyes: Negative for visual disturbance.  Respiratory: Negative for shortness of breath and wheezing.   Cardiovascular: Positive for leg swelling. Negative for chest pain.  Musculoskeletal: Negative for back pain and gait problem.  Skin: Negative for rash.  Neurological: Negative for dizziness and numbness.  All other systems reviewed and are negative.   Per HPI unless specifically indicated above   Allergies as of 01/31/2021   No Known Allergies     Medication List       Accurate as of January 31, 2021  8:25 AM. If you have any questions, ask your nurse or doctor.        acetaminophen 325 MG tablet Commonly known as: TYLENOL Take 2 tablets (650 mg total) by mouth every 6 (six) hours as needed for mild pain (or Fever >/= 101).   albuterol (2.5 MG/3ML) 0.083% nebulizer solution Commonly known as: PROVENTIL USE 1 VIAL VIA NEBULIZER 4  TIMES DAILY AS NEEDED FOR  WHEEZING OR SHORTNESS OF  BREATH What changed: See the new instructions.   apixaban 5 MG Tabs tablet Commonly known as: Eliquis Take 1 tablet (5 mg total) by mouth 2 (two) times daily.   atorvastatin 40 MG tablet Commonly known as: LIPITOR TAKE 1 TABLET BY MOUTH  DAILY   CO-Q 10 Omega-3 Fish Oil Caps Take 1 capsule by mouth in the morning. $RemoveBefo'100mg'[id number]$  Capsule Qunol   dicyclomine 20 MG tablet Commonly  known as: BENTYL Take 20 mg by mouth 3 (three) times daily as needed for spasms.   fluticasone 50 MCG/ACT nasal spray Commonly known as: FLONASE USE 1 SPRAY INTO BOTH  NOSTRILS 2 TIMES DAILY AS  NEEDED FOR ALLERGIES OR  RHINITIS   lisinopril 20 MG tablet Commonly known as: ZESTRIL TAKE 1 TABLET BY MOUTH  DAILY   metFORMIN 500 MG 24 hr tablet Commonly known as: GLUCOPHAGE-XR Take 1 tablet (500 mg total) by mouth 2 (two) times daily.   metoprolol succinate 50 MG 24 hr tablet Commonly known as: TOPROL-XL TAKE 1 TABLET BY MOUTH  DAILY WITH  OR IMMEDIATELY  FOLLOWING A MEAL   omeprazole 20 MG capsule Commonly known as: PRILOSEC TAKE 1 CAPSULE BY MOUTH  DAILY   tamsulosin 0.4 MG Caps capsule Commonly known as: FLOMAX TAKE 2 CAPSULES BY MOUTH  DAILY AFTER SUPPER   Vitamin D 1000 units capsule Take 1,000 Units by mouth in the morning.        Objective:   BP (!) 183/90   Pulse 60   Ht $R'5\' 8"'aS$  (1.727 m)   Wt 238 lb 8 oz (108.2 kg)   SpO2 96%   BMI 36.26 kg/m   Wt Readings from Last 3 Encounters:  01/31/21 238 lb 8 oz (108.2 kg)  12/06/20 237 lb (107.5 kg)  12/03/20 237 lb 3.2 oz (107.6 kg)    Physical Exam Vitals and nursing note reviewed.  Constitutional:      General: He is not in acute distress.    Appearance: He is well-developed and well-nourished. He is not diaphoretic.  Eyes:     General: No scleral icterus.    Extraocular Movements: EOM normal.     Conjunctiva/sclera: Conjunctivae normal.  Neck:     Thyroid: No thyromegaly.  Cardiovascular:     Rate and Rhythm: Normal rate and regular rhythm.     Pulses: Intact distal pulses.     Heart sounds: Normal heart sounds. No murmur heard.   Pulmonary:     Effort: Pulmonary effort is normal. No respiratory distress.     Breath sounds: Normal breath sounds. No wheezing.  Musculoskeletal:        General: No edema. Normal range of motion.     Cervical back: Neck supple.  Lymphadenopathy:     Cervical: No cervical adenopathy.  Skin:    General: Skin is warm and dry.     Findings: No rash.  Neurological:     Mental Status: He is alert and oriented to person, place, and time.     Coordination: Coordination normal.  Psychiatric:        Mood and Affect: Mood and affect normal.        Behavior: Behavior normal.       Assessment & Plan:   Problem List Items Addressed This Visit      Cardiovascular and Mediastinum   Essential hypertension   Relevant Medications   atorvastatin (LIPITOR) 40 MG tablet   Other Relevant Orders   CBC with  Differential/Platelet   CMP14+EGFR   Lipid panel   Bayer DCA Hb A1c Waived   Persistent atrial fibrillation (HCC)   Relevant Medications   atorvastatin (LIPITOR) 40 MG tablet     Respiratory   COPD (chronic obstructive pulmonary disease) (HCC)     Endocrine   Hyperlipidemia associated with type 2 diabetes mellitus (HCC)   Relevant Medications   atorvastatin (LIPITOR) 40 MG tablet   Other Relevant Orders   CBC  with Differential/Platelet   CMP14+EGFR   Lipid panel   Bayer DCA Hb A1c Waived   Type 2 diabetes mellitus (HCC) - Primary   Relevant Medications   atorvastatin (LIPITOR) 40 MG tablet   Other Relevant Orders   CBC with Differential/Platelet   CMP14+EGFR   Lipid panel   Bayer DCA Hb A1c Waived     Genitourinary   BPH (benign prostatic hyperplasia)   Relevant Medications   tamsulosin (FLOMAX) 0.4 MG CAPS capsule    Other Visit Diagnoses    Chronic gastritis without bleeding, unspecified gastritis type       Relevant Medications   omeprazole (PRILOSEC) 20 MG capsule      Continue current medication.  No change in medication, blood pressure slightly elevated today but running good at home, keep an eye on it over the next 2 weeks.  Follow up plan: Return in about 3 months (around 04/30/2021), or if symptoms worsen or fail to improve, for Diabetes and cholesterol and hypertension..  Counseling provided for all of the vaccine components Orders Placed This Encounter  Procedures  . CBC with Differential/Platelet  . CMP14+EGFR  . Lipid panel  . Bayer [facility] Hb A1c [place], MD [facility] 01/31/2021, 8:25 AM

## 2021-05-16 ENCOUNTER — Ambulatory Visit: Payer: Medicare Other | Admitting: Family Medicine

## 2021-05-21 ENCOUNTER — Ambulatory Visit (INDEPENDENT_AMBULATORY_CARE_PROVIDER_SITE_OTHER): Payer: Medicare Other

## 2021-05-21 VITALS — Ht 68.0 in | Wt 238.0 lb

## 2021-05-21 DIAGNOSIS — Z0000 Encounter for general adult medical examination without abnormal findings: Secondary | ICD-10-CM | POA: Diagnosis not present

## 2021-05-21 NOTE — Patient Instructions (Signed)
Vincent Collins , Thank you for taking time to come for your Medicare Wellness Visit. I appreciate your ongoing commitment to your health goals. Please review the following plan we discussed and let me know if I can assist you in the future.   Screening recommendations/referrals: Colonoscopy: No longer required Recommended yearly ophthalmology/optometry visit for glaucoma screening and checkup Recommended yearly dental visit for hygiene and checkup  Vaccinations: Influenza vaccine: Done 12/06/2020 - Repeat annually Pneumococcal vaccine: Done 04/20/2012 & 05/09/2015 Tdap vaccine: Done 12/06/2020 - Repeat in 10 years Shingles vaccine: Zostavax done 05/17/2014; due for Shingrix discussed. Please contact your pharmacy for coverage information.  Covid-19: Done 05/21/2020 & 06/18/2020 - due for booster  Advanced directives: Advance directive discussed with you today. I have provided a copy for you to complete at home and have notarized. Once this is complete please bring a copy in to our office so we can scan it into your chart.  Conditions/risks identified: Aim for 30 minutes of exercise or brisk walking each day, drink 6-8 glasses of water and eat lots of fruits and vegetables.  Next appointment: Follow up in one year for your annual wellness visit.   Preventive Care 31 Years and Older, Male  Preventive care refers to lifestyle choices and visits with your health care provider that can promote health and wellness. What does preventive care include?  A yearly physical exam. This is also called an annual well check.  Dental exams once or twice a year.  Routine eye exams. Ask your health care provider how often you should have your eyes checked.  Personal lifestyle choices, including:  Daily care of your teeth and gums.  Regular physical activity.  Eating a healthy diet.  Avoiding tobacco and drug use.  Limiting alcohol use.  Practicing safe sex.  Taking low doses of aspirin every  day.  Taking vitamin and mineral supplements as recommended by your health care provider. What happens during an annual well check? The services and screenings done by your health care provider during your annual well check will depend on your age, overall health, lifestyle risk factors, and family history of disease. Counseling  Your health care provider may ask you questions about your:  Alcohol use.  Tobacco use.  Drug use.  Emotional well-being.  Home and relationship well-being.  Sexual activity.  Eating habits.  History of falls.  Memory and ability to understand (cognition).  Work and work Astronomer. Screening  You may have the following tests or measurements:  Height, weight, and BMI.  Blood pressure.  Lipid and cholesterol levels. These may be checked every 5 years, or more frequently if you are over 67 years old.  Skin check.  Lung cancer screening. You may have this screening every year starting at age 48 if you have a 30-pack-year history of smoking and currently smoke or have quit within the past 15 years.  Fecal occult blood test (FOBT) of the stool. You may have this test every year starting at age 63.  Flexible sigmoidoscopy or colonoscopy. You may have a sigmoidoscopy every 5 years or a colonoscopy every 10 years starting at age 24.  Prostate cancer screening. Recommendations will vary depending on your family history and other risks.  Hepatitis C blood test.  Hepatitis B blood test.  Sexually transmitted disease (STD) testing.  Diabetes screening. This is done by checking your blood sugar (glucose) after you have not eaten for a while (fasting). You may have this done every 1-3 years.  Abdominal  aortic aneurysm (AAA) screening. You may need this if you are a current or former smoker.  Osteoporosis. You may be screened starting at age 34 if you are at high risk. Talk with your health care provider about your test results, treatment options,  and if necessary, the need for more tests. Vaccines  Your health care provider may recommend certain vaccines, such as:  Influenza vaccine. This is recommended every year.  Tetanus, diphtheria, and acellular pertussis (Tdap, Td) vaccine. You may need a Td booster every 10 years.  Zoster vaccine. You may need this after age 35.  Pneumococcal 13-valent conjugate (PCV13) vaccine. One dose is recommended after age 30.  Pneumococcal polysaccharide (PPSV23) vaccine. One dose is recommended after age 22. Talk to your health care provider about which screenings and vaccines you need and how often you need them. This information is not intended to replace advice given to you by your health care provider. Make sure you discuss any questions you have with your health care provider. Document Released: 01/11/2016 Document Revised: 09/03/2016 Document Reviewed: 10/16/2015 Elsevier Interactive Patient Education  2017 [employer].  Fall Prevention in the Home Falls can cause injuries. They can happen to people of all ages. There are many things you can do to make your home safe and to help prevent falls. What can I do on the outside of my home?  Regularly fix the edges of walkways and driveways and fix any cracks.  Remove anything that might make you trip as you walk through a door, such as a raised step or threshold.  Trim any bushes or trees on the path to your home.  Use bright outdoor lighting.  Clear any walking paths of anything that might make someone trip, such as rocks or tools.  Regularly check to see if handrails are loose or broken. Make sure that both sides of any steps have handrails.  Any raised decks and porches should have guardrails on the edges.  Have any leaves, snow, or ice cleared regularly.  Use sand or salt on walking paths during winter.  Clean up any spills in your garage right away. This includes oil or grease spills. What can I do in the bathroom?  Use night  lights.  Install grab bars by the toilet and in the tub and shower. Do not use towel bars as grab bars.  Use non-skid mats or decals in the tub or shower.  If you need to sit down in the shower, use a plastic, non-slip stool.  Keep the floor dry. Clean up any water that spills on the floor as soon as it happens.  Remove soap buildup in the tub or shower regularly.  Attach bath mats securely with double-sided non-slip rug tape.  Do not have throw rugs and other things on the floor that can make you trip. What can I do in the bedroom?  Use night lights.  Make sure that you have a light by your bed that is easy to reach.  Do not use any sheets or blankets that are too big for your bed. They should not hang down onto the floor.  Have a firm chair that has side arms. You can use this for support while you get dressed.  Do not have throw rugs and other things on the floor that can make you trip. What can I do in the kitchen?  Clean up any spills right away.  Avoid walking on wet floors.  Keep items that you use a  lot in easy-to-reach places.  If you need to reach something above you, use a strong step stool that has a grab bar.  Keep electrical cords out of the way.  Do not use floor polish or wax that makes floors slippery. If you must use wax, use non-skid floor wax.  Do not have throw rugs and other things on the floor that can make you trip. What can I do with my stairs?  Do not leave any items on the stairs.  Make sure that there are handrails on both sides of the stairs and use them. Fix handrails that are broken or loose. Make sure that handrails are as long as the stairways.  Check any carpeting to make sure that it is firmly attached to the stairs. Fix any carpet that is loose or worn.  Avoid having throw rugs at the top or bottom of the stairs. If you do have throw rugs, attach them to the floor with carpet tape.  Make sure that you have a light switch at the  top of the stairs and the bottom of the stairs. If you do not have them, ask someone to add them for you. What else can I do to help prevent falls?  Wear shoes that:  Do not have high heels.  Have rubber bottoms.  Are comfortable and fit you well.  Are closed at the toe. Do not wear sandals.  If you use a stepladder:  Make sure that it is fully opened. Do not climb a closed stepladder.  Make sure that both sides of the stepladder are locked into place.  Ask someone to hold it for you, if possible.  Clearly mark and make sure that you can see:  Any grab bars or handrails.  First and last steps.  Where the edge of each step is.  Use tools that help you move around (mobility aids) if they are needed. These include:  Canes.  Walkers.  Scooters.  Crutches.  Turn on the lights when you go into a dark area. Replace any light bulbs as soon as they burn out.  Set up your furniture so you have a clear path. Avoid moving your furniture around.  If any of your floors are uneven, fix them.  If there are any pets around you, be aware of where they are.  Review your medicines with your doctor. Some medicines can make you feel dizzy. This can increase your chance of falling. Ask your doctor what other things that you can do to help prevent falls. This information is not intended to replace advice given to you by your health care provider. Make sure you discuss any questions you have with your health care provider. Document Released: 10/11/2009 Document Revised: 05/22/2016 Document Reviewed: 01/19/2015 Elsevier Interactive Patient Education  2017 [employer].

## 2021-05-21 NOTE — Progress Notes (Signed)
Subjective:   Vincent Collins is a 80 y.o. male who presents for Medicare Annual/Subsequent preventive examination.  Virtual Visit via Telephone Note  I connected with  Vincent Collins on 05/21/21 at  9:00 AM EDT by telephone and verified that I am speaking with the correct person using two identifiers.  Location: Patient: Home Provider: [facility] Persons participating in the virtual visit: patient, his wife/Nurse Health Advisor   I discussed the limitations, risks, security and privacy concerns of performing an evaluation and management service by telephone and the availability of in person appointments. The patient expressed understanding and agreed to proceed.  Interactive audio and video telecommunications were attempted between this nurse and patient, however failed, due to patient having technical difficulties OR patient did not have access to video capability.  We continued and completed visit with audio only.  Some vital signs may be absent or patient reported.   Thurmon Mizell E Beverly Ferner, LPN   Review of Systems     Cardiac Risk Factors include: advanced age (>102men, >80 women);diabetes mellitus;dyslipidemia;hypertension;male gender;obesity (BMI >30kg/m2);sedentary lifestyle     Objective:    Today's Vitals   05/21/21 0845 05/21/21 0846  Weight: 238 lb (108 kg)   Height: 5\' 8"  (1.727 m)   PainSc:  3    Body mass index is 36.19 kg/m.  Advanced Directives 05/21/2021 08/03/2020 07/31/2020 04/16/2020 03/26/2020 03/24/2020 12/13/2019  Does Patient Have a Medical Advance Directive? No No No No No No No  Does patient want to make changes to medical advance directive? - - - - - - -  Would patient like information on creating a medical advance directive? Yes (MAU/Ambulatory/Procedural Areas - Information given) No - Patient declined No - Patient declined No - Patient declined No - Patient declined - -    Current Medications (verified) Outpatient Encounter Medications as of 05/21/2021  Medication  Sig  . acetaminophen (TYLENOL) 325 MG tablet Take 2 tablets (650 mg total) by mouth every 6 (six) hours as needed for mild pain (or Fever >/= 101).  05/23/2021 albuterol (PROVENTIL) (2.5 MG/3ML) 0.083% nebulizer solution USE 1 VIAL VIA NEBULIZER 4  TIMES DAILY AS NEEDED FOR  WHEEZING OR SHORTNESS OF  BREATH (Patient taking differently: Take 2.5 mg by nebulization 4 (four) times daily as needed for wheezing or shortness of breath.)  . apixaban (ELIQUIS) 5 MG TABS tablet Take 1 tablet (5 mg total) by mouth 2 (two) times daily.  Marland Kitchen aspirin 81 MG EC tablet Take by mouth.  Marland Kitchen atorvastatin (LIPITOR) 40 MG tablet Take 1 tablet (40 mg total) by mouth daily.  . Cholecalciferol (VITAMIN D) 1000 UNITS capsule Take 1,000 Units by mouth in the morning.  . Coenzyme Q10-Fish Oil-Vit E (CO-Q 10 OMEGA-3 FISH OIL) CAPS Take 1 capsule by mouth in the morning. 100mg  Capsule Qunol  . dicyclomine (BENTYL) 20 MG tablet Take 20 mg by mouth 3 (three) times daily as needed for spasms.  . fluticasone (FLONASE) 50 MCG/ACT nasal spray USE 1 SPRAY INTO BOTH  NOSTRILS 2 TIMES DAILY AS  NEEDED FOR ALLERGIES OR  RHINITIS  . gabapentin (NEURONTIN) 100 MG capsule Take by mouth.  Marland Kitchen lisinopril (ZESTRIL) 20 MG tablet TAKE 1 TABLET BY MOUTH  DAILY  . metFORMIN (GLUCOPHAGE-XR) 500 MG 24 hr tablet Take 1 tablet (500 mg total) by mouth 2 (two) times daily.  . metoprolol succinate (TOPROL-XL) 50 MG 24 hr tablet TAKE 1 TABLET BY MOUTH  DAILY WITH OR IMMEDIATELY  FOLLOWING A MEAL  .  omeprazole (PRILOSEC) 20 MG capsule Take 1 capsule (20 mg total) by mouth daily.  . tamsulosin (FLOMAX) 0.4 MG CAPS capsule Take 2 capsules daily after supper  . [DISCONTINUED] omeprazole (PRILOSEC OTC) 20 MG tablet take 1 capsule by oral route  every day  . HYDROcodone-acetaminophen (NORCO/VICODIN) 5-325 MG tablet take 1 tablet by oral route  every 6 hours as needed for pain (Patient not taking: Reported on 05/21/2021)  . lidocaine (LIDODERM) 5 % Place onto the skin.  (Patient not taking: Reported on 05/21/2021)   No facility-administered encounter medications on file as of 05/21/2021.    Allergies (verified) Patient has no known allergies.   History: Past Medical History:  Diagnosis Date  . Arrhythmia   . Broken leg and ribs, right, closed, initial encounter   . COPD (chronic obstructive pulmonary disease) (HCC)   . Diabetes mellitus without complication (HCC)   . Diverticulitis   . GERD (gastroesophageal reflux disease)   . Hydronephrosis of left kidney   . Hyperlipidemia   . Hypertension   . Left ureteral stone    obstructing  . Renal calculus or stone 2015   Past Surgical History:  Procedure Laterality Date  . 2d echo  04/04/09  . CHOLECYSTECTOMY N/A 08/03/2020   Procedure: LAPAROSCOPIC CHOLECYSTECTOMY WITH INTRAOPERATIVE CHOLANGIOGRAM;  Surgeon: Gaynelle Adu, MD;  Location: [facility] OR;  Service: General;  Laterality: N/A;  . CYSTOSCOPY WITH RETROGRADE PYELOGRAM, URETEROSCOPY AND STENT PLACEMENT Left 10/11/2014   Procedure: CYSTOSCOPY WITH RETROGRADE PYELOGRAM, DIAGNOSTIC URETEROSCOPY AND STENT PLACEMENT;  Surgeon: [name], MD;  Location: [facility];  Service: Urology;  Laterality: Left;  . LUMBAR DISC SURGERY    . SPINE SURGERY     Family History  Problem Relation Age of Onset  . Early death Father        MI age 58  . Heart attack Father   . COPD Brother   . Cancer Sister    Social History   Socioeconomic History  . Marital status: Married    Spouse name: Harriett Sine  . Number of children: 2  . Years of education: 8th  . Highest education level: 8th grade  Occupational History  . Not on file  Tobacco Use  . Smoking status: Former Smoker    Types: Cigarettes    Quit date: 06/18/1967    Years since quitting: 53.9  . Smokeless tobacco: Former Neurosurgeon    Types: Engineer, drilling  . Vaping Use: Never used  Substance and Sexual Activity  . Alcohol use: No  . Drug use: No  . Sexual activity: Not Currently  Other  Topics Concern  . Not on file  Social History Narrative   Lives home with wife - still working part time   Social Determinants of Health   Financial Resource Strain: Low Risk   . Difficulty of Paying Living Expenses: Not hard at all  Food Insecurity: No Food Insecurity  . Worried About Programme researcher, broadcasting/film/video in the Last Year: Never true  . Ran Out of Food in the Last Year: Never true  Transportation Needs: No Transportation Needs  . Lack of Transportation (Medical): No  . Lack of Transportation (Non-Medical): No  Physical Activity: Sufficiently Active  . Days of Exercise per Week: 4 days  . Minutes of Exercise per Session: 40 min  Stress: No Stress Concern Present  . Feeling of Stress : Not at all  Social Connections: Socially Integrated  . Frequency of Communication with Friends and Family:  Collins than three times a week  . Frequency of Social Gatherings with Friends and Family: Collins than three times a week  . Attends Religious Services: 1 to 4 times per year  . Active Member of Clubs or Organizations: Yes  . Attends Banker Meetings: 1 to 4 times per year  . Marital Status: Married    Tobacco Counseling Counseling given: Not Answered   Clinical Intake:  Pre-visit preparation completed: Yes  Pain : 0-10 Pain Score: 3  Pain Type: Acute pain Pain Location: Foot Pain Orientation: Right Pain Descriptors / Indicators: Aching,Sharp Pain Onset: 1 to 4 weeks ago Pain Frequency: Several days a week     BMI - recorded: 36.19 Nutritional Status: BMI > 30  Obese Nutritional Risks: None Diabetes: Yes CBG done?: No Did pt. bring in CBG monitor from home?: No  How often do you need to have someone help you when you read instructions, pamphlets, or other written materials from your doctor or pharmacy?: 1 - Never   Nutrition Risk Assessment:  Has the patient had any N/V/D within the last 2 months?  No  Does the patient have any non-healing wounds?  No  Has the  patient had any unintentional weight loss or weight gain?  No   Diabetes:  Is the patient diabetic?  Yes  If diabetic, was a CBG obtained today?  No  Did the patient bring in their glucometer from home?  No  How often do you monitor your CBG's? Hasn't been checking - has a complicated glucometer - is going to go back to pharmacy and see if he can get a Collins simple basic one.   Financial Strains and Diabetes Management:  Are you having any financial strains with the device, your supplies or your medication? No .  Does the patient want to be seen by Chronic Care Management for management of their diabetes?  No  Would the patient like to be referred to a Nutritionist or for Diabetic Management?  No   Diabetic Exams:  Diabetic Eye Exam: Completed 2020. Overdue for diabetic eye exam. Pt has been advised about the importance in completing this exam. He is going to get appt with Dr Conley Rolls soon  Diabetic Foot Exam: Completed 04/13/2020. Pt has been advised about the importance in completing this exam. Pt is scheduled for diabetic foot exam on 05/23/2021.    Interpreter Needed?: No  Information entered by :: Davaun Quintela, LPN   Activities of Daily Living In your present state of health, do you have any difficulty performing the following activities: 05/21/2021 08/01/2020  Hearing? N -  Vision? N -  Difficulty concentrating or making decisions? N -  Walking or climbing stairs? N -  Dressing or bathing? N -  Doing errands, shopping? N N  Preparing Food and eating ? N -  Using the Toilet? N -  In the past six months, have you accidently leaked urine? N -  Do you have problems with loss of bowel control? N -  Managing your Medications? N -  Managing your Finances? N -  Housekeeping or managing your Housekeeping? N -  Some recent data might be hidden    Patient Care Team: Dettinger, Elige Radon, MD as PCP - General (Family Medicine) O'Neal, Ronnald Ramp, MD as PCP - Cardiology  (Cardiology) O'Neal, Ronnald Ramp, MD as Consulting Physician (Cardiology)  Indicate any recent Medical Services you may have received from other than [facility] providers in the past year (date may  be approximate).     Assessment:   This is a routine wellness examination for Vincent FearingJames.  Hearing/Vision screen  Hearing Screening   125Hz  250Hz  500Hz  1000Hz  2000Hz  3000Hz  4000Hz  6000Hz  8000Hz   Right ear:           Left ear:           Comments: Denies hearing difficulties  Vision Screening Comments: Wears eyeglasses - [place] - behind on eye exam  Dietary issues and exercise activities discussed: Current Exercise Habits: Home exercise routine, Type of exercise: walking, Time (Minutes): 40, Frequency (Times/Week): 4, Weekly Exercise (Minutes/Week): 160, Intensity: Moderate, Exercise limited by: respiratory conditions(s);orthopedic condition(s)  Goals Addressed            This Visit's Progress   . DIET - EAT Collins FRUITS AND VEGETABLES   On track   . Exercise 150 minutes per week (moderate activity)   On track    Try to walk for at least 30 minutes daily. Avoid the heat of the day.     . Reduce portion size   On track     Depression Screen PHQ 2/9 Scores 05/21/2021 01/31/2021 12/06/2020 08/13/2020 05/18/2020 04/16/2020 04/13/2020  PHQ - 2 Score 0 0 0 0 0 0 0  PHQ- 9 Score - - - - - - 0    Fall Risk Fall Risk  05/21/2021 01/31/2021 12/06/2020 08/13/2020 05/18/2020  Falls in the past year? 1 1 1  0 1  Number falls in past yr: 0 0 0 - 0  Injury with Fall? 1 1 1  - 1  Comment - - - - -  Risk for fall due to : Impaired vision;Orthopedic patient;Medication side effect;History of fall(s) Impaired balance/gait Impaired balance/gait - History of fall(s)  Follow up Falls prevention discussed;Education provided Falls evaluation completed Falls evaluation completed - Falls evaluation completed    FALL RISK PREVENTION PERTAINING TO THE HOME:  Any stairs in or around the home? No   If so, are there any without handrails? No  Home free of loose throw rugs in walkways, pet beds, electrical cords, etc? Yes  Adequate lighting in your home to reduce risk of falls? Yes   ASSISTIVE DEVICES UTILIZED TO PREVENT FALLS:  Life alert? No  Use of a cane, walker or w/c? No  Grab bars in the bathroom? No  Shower chair or bench in shower? No  Elevated toilet seat or a handicapped toilet? No   TIMED UP AND GO:  Was the test performed? No . telephonic visit  Cognitive Function: Normal cognitive status assessed by direct observation by this Nurse Health Advisor. No abnormalities found.  MMSE - Mini Mental State Exam 06/17/2017  Not completed: Unable to complete  Orientation to time 5  Orientation to Place 4  Registration 3  Attention/ Calculation 0  Attention/Calculation-comments unable to spell  Recall 3  Language- name 2 objects 2  Language- repeat 1  Language- follow 3 step command 3  Language- read & follow direction 0  Language-read & follow direction-comments difficulty with reading and spelling  Write a sentence 0  Write a sentence-comments difficulty with reading and spelling  Copy design 1  Total score 22     6CIT Screen 04/16/2020  What Year? 0 points  What month? 0 points  What time? 0 points  Count back from 20 0 points  Months in reverse 0 points  Repeat phrase 4 points  Total Score 4    Immunizations Immunization History  Administered Date(s) Administered  . DTaP 11/23/2007  . Fluad Quad(high Dose 65+) 01/04/2020, 12/06/2020  . Influenza Whole 09/26/2010  . Influenza, High Dose Seasonal PF 10/07/2017, 01/04/2019  . Influenza,inj,Quad PF,6+ Mos 10/06/2013, 10/02/2014, 10/19/2015, 11/26/2016  . Moderna Sars-Covid-2 Vaccination 05/21/2020, 06/18/2020  . Pneumococcal Conjugate-13 05/09/2015  . Pneumococcal Polysaccharide-23 04/05/2012, 04/20/2012  . Tdap 05/17/2009, 12/06/2020  . Zoster 05/17/2014    TDAP status: Up to date  Flu Vaccine  status: Up to date  Pneumococcal vaccine status: Up to date  Covid-19 vaccine status: Completed vaccines  Qualifies for Shingles Vaccine? Yes   Zostavax completed Yes   Shingrix Completed?: No.    Education has been provided regarding the importance of this vaccine. Patient has been advised to call insurance company to determine out of pocket expense if they have not yet received this vaccine. Advised may also receive vaccine at local pharmacy or Health Dept. Verbalized acceptance and understanding.  Screening Tests Health Maintenance  Topic Date Due  . FOOT EXAM  04/13/2021  . OPHTHALMOLOGY EXAM  06/07/2021 (Originally 05/26/2015)  . COVID-19 Vaccine (3 - Booster for Moderna series) 07/31/2021 (Originally 11/18/2020)  . INFLUENZA VACCINE  07/29/2021  . HEMOGLOBIN A1C  07/31/2021  . TETANUS/TDAP  12/06/2030  . Hepatitis C Screening  Completed  . PNA vac Low Risk Adult  Completed  . HPV VACCINES  Aged Out    Health Maintenance  Health Maintenance Due  Topic Date Due  . FOOT EXAM  04/13/2021    Colorectal cancer screening: No longer required.   Lung Cancer Screening: (Low Dose CT Chest recommended if Age 77-80 years, 30 pack-year currently smoking OR have quit w/in 15years.) does not qualify.   Additional Screening:  Hepatitis C Screening: does not qualify; Completed 03/25/2020  Vision Screening: Recommended annual ophthalmology exams for early detection of glaucoma and other disorders of the eye. Is the patient up to date with their annual eye exam?  No  Who is the provider or what is the name of the office in which the patient attends annual eye exams? Dr Conley Rolls If pt is not established with a provider, would they like to be referred to a provider to establish care? No .   Dental Screening: Recommended annual dental exams for proper oral hygiene  Community Resource Referral / Chronic Care Management: CRR required this visit?  No   CCM required this visit?  No      Plan:      I have personally reviewed and noted the following in the patient's chart:   . Medical and social history . Use of alcohol, tobacco or illicit drugs  . Current medications and supplements including opioid prescriptions. Patient is not currently taking opioid prescriptions. . Functional ability and status . Nutritional status . Physical activity . Advanced directives . List of other physicians . Hospitalizations, surgeries, and ER visits in previous 12 months . Vitals . Screenings to include cognitive, depression, and falls . Referrals and appointments  In addition, I have reviewed and discussed with patient certain preventive protocols, quality metrics, and best practice recommendations. A written personalized care plan for preventive services as well as general preventive health recommendations were provided to patient.     [place] Constable, LPN   4/31/5400   Nurse Notes: None

## 2021-05-23 ENCOUNTER — Other Ambulatory Visit: Payer: Self-pay

## 2021-05-23 ENCOUNTER — Encounter: Payer: Self-pay | Admitting: Family Medicine

## 2021-05-23 ENCOUNTER — Ambulatory Visit (INDEPENDENT_AMBULATORY_CARE_PROVIDER_SITE_OTHER): Payer: Medicare Other | Admitting: Family Medicine

## 2021-05-23 VITALS — BP 146/81 | HR 63 | Temp 98.4°F | Resp 20 | Ht 68.0 in | Wt 236.0 lb

## 2021-05-23 DIAGNOSIS — I152 Hypertension secondary to endocrine disorders: Secondary | ICD-10-CM

## 2021-05-23 DIAGNOSIS — N401 Enlarged prostate with lower urinary tract symptoms: Secondary | ICD-10-CM

## 2021-05-23 DIAGNOSIS — J019 Acute sinusitis, unspecified: Secondary | ICD-10-CM | POA: Diagnosis not present

## 2021-05-23 DIAGNOSIS — E785 Hyperlipidemia, unspecified: Secondary | ICD-10-CM

## 2021-05-23 DIAGNOSIS — E1169 Type 2 diabetes mellitus with other specified complication: Secondary | ICD-10-CM | POA: Diagnosis not present

## 2021-05-23 DIAGNOSIS — E1159 Type 2 diabetes mellitus with other circulatory complications: Secondary | ICD-10-CM | POA: Diagnosis not present

## 2021-05-23 DIAGNOSIS — Z125 Encounter for screening for malignant neoplasm of prostate: Secondary | ICD-10-CM | POA: Diagnosis not present

## 2021-05-23 DIAGNOSIS — I10 Essential (primary) hypertension: Secondary | ICD-10-CM | POA: Diagnosis not present

## 2021-05-23 DIAGNOSIS — R351 Nocturia: Secondary | ICD-10-CM

## 2021-05-23 LAB — BAYER DCA HB A1C WAIVED: HB A1C (BAYER DCA - WAIVED): 6.5 % (ref <7.0)

## 2021-05-23 MED ORDER — FLUTICASONE PROPIONATE 50 MCG/ACT NA SUSP: NASAL | 1 refills | Status: DC

## 2021-05-23 MED ORDER — METOPROLOL SUCCINATE ER 50 MG PO TB24: ORAL_TABLET | ORAL | 1 refills | Status: DC

## 2021-05-23 MED ORDER — METFORMIN HCL ER 500 MG PO TB24: 500.0000 mg | ORAL_TABLET | Freq: Two times a day (BID) | ORAL | 3 refills | Status: DC

## 2021-05-23 MED ORDER — LISINOPRIL 20 MG PO TABS: 1.0000 | ORAL_TABLET | Freq: Every day | ORAL | 1 refills | Status: DC

## 2021-05-23 MED ORDER — ALBUTEROL SULFATE (2.5 MG/3ML) 0.083% IN NEBU: 2.5000 mg | INHALATION_SOLUTION | Freq: Four times a day (QID) | RESPIRATORY_TRACT | 2 refills | Status: DC | PRN

## 2021-05-23 NOTE — Progress Notes (Signed)
BP (!) 146/81   Pulse 63   Temp 98.4 F (36.9 C) (Temporal)   Resp 20   Ht $R'5\' 8"'SZ$  (1.727 m)   Wt 236 lb (107 kg)   SpO2 95%   BMI 35.88 kg/m    Subjective:   Patient ID: Vincent Collins, male    DOB: [birth year], 80 y.o.   MRN: [id number]  HPI: Vincent Collins is a 80 y.o. male presenting on 05/23/2021 for Medical Management of Chronic Issues   HPI Type 2 diabetes mellitus Patient comes in today for recheck of his diabetes. Patient has been currently taking metformin. Patient is currently on an ACE inhibitor/ARB. Patient has not seen an ophthalmologist this year. Patient denies any issues with their feet except for pain under his right heel, no skin changes. The symptom started onset as an adult hypertension and hyperlipidemia ARE RELATED TO DM   Hypertension Patient is currently on lisinopril and metoprolol, and their blood pressure today is 146/81. Patient denies any lightheadedness or dizziness. Patient denies headaches, blurred vision, chest pains, shortness of breath, or weakness. Denies any side effects from medication and is content with current medication.   Hyperlipidemia Patient is coming in for recheck of his hyperlipidemia. The patient is currently taking atorvastatin. They deny any issues with myalgias or history of liver damage from it. They deny any focal numbness or weakness or chest pain.   BPH Patient is coming in for recheck on BPH Symptoms: None currently Medication: Flomax Last PSA: Over a year ago  Patient has A. fib as well and takes Eliquis and has been stable and not denies any bruising or bleeding.  Relevant past medical, surgical, family and social history reviewed and updated as indicated. Interim medical history since our last visit reviewed. Allergies and medications reviewed and updated.  Review of Systems  Constitutional: Negative for chills and fever.  Eyes: Negative for visual disturbance.  Respiratory: Negative for shortness of breath and  wheezing.   Cardiovascular: Negative for chest pain and leg swelling.  Musculoskeletal: Positive for arthralgias. Negative for back pain and gait problem.  Skin: Negative for rash.  Neurological: Negative for dizziness, weakness and light-headedness.  All other systems reviewed and are negative.   Per HPI unless specifically indicated above   Allergies as of 05/23/2021   No Known Allergies     Medication List       Accurate as of May 23, 2021  9:54 AM. If you have any questions, ask your nurse or doctor.        STOP taking these medications   aspirin 81 MG EC tablet Stopped by: Fransisca Kaufmann Robt Okuda, MD   dicyclomine 20 MG tablet Commonly known as: BENTYL Stopped by: Fransisca Kaufmann Heena Woodbury, MD   gabapentin 100 MG capsule Commonly known as: NEURONTIN Stopped by: [name], MD   HYDROcodone-acetaminophen 5-325 MG tablet Commonly known as: NORCO/VICODIN Stopped by: Fransisca Kaufmann Casmere Hollenbeck, MD   lidocaine 5 % Commonly known as: LIDODERM Stopped by: [name], MD     TAKE these medications   acetaminophen 325 MG tablet Commonly known as: TYLENOL Take 2 tablets (650 mg total) by mouth every 6 (six) hours as needed for mild pain (or Fever >/= 101).   albuterol (2.5 MG/3ML) 0.083% nebulizer solution Commonly known as: PROVENTIL Take 3 mLs (2.5 mg total) by nebulization every 6 (six) hours as needed for wheezing or shortness of breath. USE 1 VIAL VIA NEBULIZER 4  TIMES DAILY AS NEEDED  FOR  WHEEZING OR SHORTNESS OF  BREATH What changed: See the new instructions. Changed by: Fransisca Kaufmann Kaira Stringfield, MD   apixaban 5 MG Tabs tablet Commonly known as: Eliquis Take 1 tablet (5 mg total) by mouth 2 (two) times daily.   atorvastatin 40 MG tablet Commonly known as: LIPITOR Take 1 tablet (40 mg total) by mouth daily.   CO-Q 10 Omega-3 Fish Oil Caps Take 1 capsule by mouth in the morning. $RemoveBefo'100mg'HCoEHgtFxZl$  Capsule Qunol   fluticasone 50 MCG/ACT nasal spray Commonly known as:  FLONASE 1 spray per nostril daily What changed: See the new instructions. Changed by: Fransisca Kaufmann Daniqua Campoy, MD   lisinopril 20 MG tablet Commonly known as: ZESTRIL Take 1 tablet (20 mg total) by mouth daily.   metFORMIN 500 MG 24 hr tablet Commonly known as: GLUCOPHAGE-XR Take 1 tablet (500 mg total) by mouth 2 (two) times daily.   metoprolol succinate 50 MG 24 hr tablet Commonly known as: TOPROL-XL TAKE 1 TABLET BY MOUTH  DAILY WITH OR IMMEDIATELY  FOLLOWING A MEAL   omeprazole 20 MG capsule Commonly known as: PRILOSEC Take 1 capsule (20 mg total) by mouth daily.   tamsulosin 0.4 MG Caps capsule Commonly known as: FLOMAX Take 2 capsules daily after supper   Vitamin D 1000 units capsule Take 1,000 Units by mouth in the morning.        Objective:   BP (!) 146/81   Pulse 63   Temp 98.4 F (36.9 C) (Temporal)   Resp 20   Ht $R'5\' 8"'mn$  (1.727 m)   Wt 236 lb (107 kg)   SpO2 95%   BMI 35.88 kg/m   Wt Readings from Last 3 Encounters:  05/23/21 236 lb (107 kg)  05/21/21 238 lb (108 kg)  01/31/21 238 lb 8 oz (108.2 kg)    Physical Exam Vitals and nursing note reviewed.  Constitutional:      General: He is not in acute distress.    Appearance: He is well-developed. He is not diaphoretic.  Eyes:     General: No scleral icterus.    Conjunctiva/sclera: Conjunctivae normal.  Neck:     Thyroid: No thyromegaly.  Cardiovascular:     Rate and Rhythm: Normal rate and regular rhythm.     Heart sounds: Normal heart sounds. No murmur heard.   Pulmonary:     Effort: Pulmonary effort is normal. No respiratory distress.     Breath sounds: Normal breath sounds. No wheezing.  Musculoskeletal:        General: Normal range of motion.     Cervical back: Neck supple.     Comments: No pain to palpation on the right heel but that is where the pain he says occurs but is not there today  Lymphadenopathy:     Cervical: No cervical adenopathy.  Skin:    General: Skin is warm and dry.      Findings: No rash.  Neurological:     Mental Status: He is alert and oriented to person, place, and time.     Coordination: Coordination normal.  Psychiatric:        Behavior: Behavior normal.       Assessment & Plan:   Problem List Items Addressed This Visit      Cardiovascular and Mediastinum   Essential hypertension   Relevant Medications   metoprolol succinate (TOPROL-XL) 50 MG 24 hr tablet   lisinopril (ZESTRIL) 20 MG tablet   Other Relevant Orders   CBC with Differential/Platelet  CMP14+EGFR     Endocrine   Hyperlipidemia associated with type 2 diabetes mellitus (HCC)   Relevant Medications   metFORMIN (GLUCOPHAGE-XR) 500 MG 24 hr tablet   lisinopril (ZESTRIL) 20 MG tablet   Other Relevant Orders   Lipid panel   Type 2 diabetes mellitus (HCC) - Primary   Relevant Medications   metFORMIN (GLUCOPHAGE-XR) 500 MG 24 hr tablet   lisinopril (ZESTRIL) 20 MG tablet   Other Relevant Orders   Bayer DCA Hb A1c Waived (Completed)   Microalbumin / creatinine urine ratio     Genitourinary   BPH (benign prostatic hyperplasia)    Other Visit Diagnoses    Screening for prostate cancer       Relevant Orders   PSA, total and free   Hypertension associated with diabetes ([place])       Relevant Medications   metoprolol succinate (TOPROL-XL) 50 MG 24 hr tablet   metFORMIN (GLUCOPHAGE-XR) 500 MG 24 hr tablet   lisinopril (ZESTRIL) 20 MG tablet   Acute rhinosinusitis       Relevant Medications   fluticasone (FLONASE) 50 MCG/ACT nasal spray      A1c looks good at 6.5, continue current medicine.  No changes.  Seems to be doing really well.  Has a little pain under 1 heel, likely plantar fasciitis versus bone spurs we recommend to see podiatrist like he has before. Follow up plan: Return in about 3 months (around 08/23/2021), or if symptoms worsen or fail to improve, for Diabetes and hypertension.  Counseling provided for all of the vaccine components Orders Placed This  Encounter  Procedures  . Bayer DCA Hb A1c Waived  . CBC with Differential/Platelet  . CMP14+EGFR  . Lipid panel  . Microalbumin / creatinine urine ratio  . PSA, total and free    Caryl Pina, MD [facility] 05/23/2021, 9:54 AM

## 2021-05-24 LAB — LIPID PANEL
Chol/HDL Ratio: 2.9 ratio (ref 0.0–5.0)
Cholesterol, Total: 104 mg/dL (ref 100–199)
HDL: 36 mg/dL — ABNORMAL LOW (ref >39)
LDL Chol Calc (NIH): 47 mg/dL (ref 0–99)
Triglycerides: 118 mg/dL (ref 0–149)
VLDL Cholesterol Cal: 21 mg/dL (ref 5–40)

## 2021-05-24 LAB — CBC WITH DIFFERENTIAL/PLATELET
Basophils Absolute: 0.1 10*3/uL (ref 0.0–0.2)
Basos: 1 %
EOS (ABSOLUTE): 0.2 10*3/uL (ref 0.0–0.4)
Eos: 2 %
Hematocrit: 47 % (ref 37.5–51.0)
Hemoglobin: 16.1 g/dL (ref 13.0–17.7)
Immature Grans (Abs): 0 10*3/uL (ref 0.0–0.1)
Immature Granulocytes: 0 %
Lymphocytes Absolute: 1.7 10*3/uL (ref 0.7–3.1)
Lymphs: 21 %
MCH: 30.1 pg (ref 26.6–33.0)
MCHC: 34.3 g/dL (ref 31.5–35.7)
MCV: 88 fL (ref 79–97)
Monocytes Absolute: 0.6 10*3/uL (ref 0.1–0.9)
Monocytes: 8 %
Neutrophils Absolute: 5.6 10*3/uL (ref 1.4–7.0)
Neutrophils: 68 %
Platelets: 159 10*3/uL (ref 150–450)
RBC: 5.34 x10E6/uL (ref 4.14–5.80)
RDW: 13.1 % (ref 11.6–15.4)
WBC: 8.3 10*3/uL (ref 3.4–10.8)

## 2021-05-24 LAB — CMP14+EGFR
ALT: 12 IU/L (ref 0–44)
AST: 15 IU/L (ref 0–40)
Albumin/Globulin Ratio: 2.2 (ref 1.2–2.2)
Albumin: 4.1 g/dL (ref 3.7–4.7)
Alkaline Phosphatase: 93 IU/L (ref 44–121)
BUN/Creatinine Ratio: 18 (ref 10–24)
BUN: 19 mg/dL (ref 8–27)
Bilirubin Total: 1.3 mg/dL — ABNORMAL HIGH (ref 0.0–1.2)
CO2: 23 mmol/L (ref 20–29)
Calcium: 9 mg/dL (ref 8.6–10.2)
Chloride: 102 mmol/L (ref 96–106)
Creatinine, Ser: 1.07 mg/dL (ref 0.76–1.27)
Globulin, Total: 1.9 g/dL (ref 1.5–4.5)
Glucose: 129 mg/dL — ABNORMAL HIGH (ref 65–99)
Potassium: 3.9 mmol/L (ref 3.5–5.2)
Sodium: 140 mmol/L (ref 134–144)
Total Protein: 6 g/dL (ref 6.0–8.5)
eGFR: 71 mL/min/{1.73_m2} (ref >59)

## 2021-05-24 LAB — PSA, TOTAL AND FREE
PSA, Free Pct: 21.1 %
PSA, Free: 0.38 ng/mL
Prostate Specific Ag, Serum: 1.8 ng/mL (ref 0.0–4.0)

## 2021-08-28 ENCOUNTER — Other Ambulatory Visit: Payer: Self-pay

## 2021-08-28 ENCOUNTER — Encounter: Payer: Self-pay | Admitting: Family Medicine

## 2021-08-28 ENCOUNTER — Ambulatory Visit (INDEPENDENT_AMBULATORY_CARE_PROVIDER_SITE_OTHER): Payer: Medicare Other | Admitting: Family Medicine

## 2021-08-28 VITALS — BP 149/84 | HR 62 | Ht 68.0 in | Wt 232.0 lb

## 2021-08-28 DIAGNOSIS — E1169 Type 2 diabetes mellitus with other specified complication: Secondary | ICD-10-CM

## 2021-08-28 DIAGNOSIS — I10 Essential (primary) hypertension: Secondary | ICD-10-CM

## 2021-08-28 DIAGNOSIS — I4819 Other persistent atrial fibrillation: Secondary | ICD-10-CM | POA: Diagnosis not present

## 2021-08-28 DIAGNOSIS — E785 Hyperlipidemia, unspecified: Secondary | ICD-10-CM | POA: Diagnosis not present

## 2021-08-28 LAB — BAYER DCA HB A1C WAIVED: HB A1C (BAYER DCA - WAIVED): 6.6 % (ref <7.0)

## 2021-08-28 NOTE — Progress Notes (Signed)
BP (!) 149/84   Pulse 62   Ht 5\' 8"  (1.727 m)   Wt 232 lb (105.2 kg)   SpO2 95%   BMI 35.28 kg/m    Subjective:   Patient ID: Vincent Collins, male    DOB: July 21, 1941, 80 y.o.   MRN: [id number]  HPI: Vincent Collins is a 80 y.o. male presenting on 08/28/2021 for Medical Management of Chronic Issues, Diabetes, Hypertension, and Hyperlipidemia   HPI Type 2 diabetes mellitus Patient comes in today for recheck of his diabetes. Patient has been currently taking metformin. Patient is currently on an ACE inhibitor/ARB. Patient has seen an ophthalmologist this year. Patient denies any issues with their feet. The symptom started onset as an adult hyperlipidemia and hypertension and A. fib ARE RELATED TO DM   Hyperlipidemia Patient is coming in for recheck of his hyperlipidemia. The patient is currently taking atorvastatin. They deny any issues with myalgias or history of liver damage from it. They deny any focal numbness or weakness or chest pain.    Hypertension and A. fib Patient is currently on Eliquis and lisinopril and metoprolol, and their blood pressure today is 149/84. Patient denies any lightheadedness or dizziness. Patient denies headaches, blurred vision, chest pains, shortness of breath, or weakness. Denies any side effects from medication and is content with current medication.   Relevant past medical, surgical, family and social history reviewed and updated as indicated. Interim medical history since our last visit reviewed. Allergies and medications reviewed and updated.  Review of Systems  Constitutional:  Negative for chills and fever.  Respiratory:  Negative for shortness of breath and wheezing.   Cardiovascular:  Negative for chest pain, palpitations and leg swelling.  Gastrointestinal:  Negative for blood in stool.  Genitourinary:  Negative for hematuria.  Musculoskeletal:  Negative for back pain and gait problem.  Skin:  Negative for rash.  Neurological:  Negative for  dizziness and light-headedness.  All other systems reviewed and are negative.  Per HPI unless specifically indicated above   Allergies as of 08/28/2021   No Known Allergies      Medication List        Accurate as of August 28, 2021  8:37 AM. If you have any questions, ask your nurse or doctor.          acetaminophen 325 MG tablet Commonly known as: TYLENOL Take 2 tablets (650 mg total) by mouth every 6 (six) hours as needed for mild pain (or Fever >/= 101).   albuterol (2.5 MG/3ML) 0.083% nebulizer solution Commonly known as: PROVENTIL Take 3 mLs (2.5 mg total) by nebulization every 6 (six) hours as needed for wheezing or shortness of breath. USE 1 VIAL VIA NEBULIZER 4&amp;nbsp;&amp;nbsp;TIMES DAILY AS NEEDED FOR&amp;nbsp;&amp;nbsp;WHEEZING OR SHORTNESS OF&amp;nbsp;&amp;nbsp;BREATH   apixaban 5 MG Tabs tablet Commonly known as: Eliquis Take 1 tablet (5 mg total) by mouth 2 (two) times daily.   atorvastatin 40 MG tablet Commonly known as: LIPITOR Take 1 tablet (40 mg total) by mouth daily.   CO-Q 10 Omega-3 Fish Oil Caps Take 1 capsule by mouth in the morning. 100mg  Capsule Qunol   fluticasone 50 MCG/ACT nasal spray Commonly known as: FLONASE 1 spray per nostril daily   lisinopril 20 MG tablet Commonly known as: ZESTRIL Take 1 tablet (20 mg total) by mouth daily.   metFORMIN 500 MG 24 hr tablet Commonly known as: GLUCOPHAGE-XR Take 1 tablet (500 mg total) by mouth 2 (two) times daily.   metoprolol succinate 50  MG 24 hr tablet Commonly known as: TOPROL-XL TAKE 1 TABLET BY MOUTH  DAILY WITH OR IMMEDIATELY  FOLLOWING A MEAL   omeprazole 20 MG capsule Commonly known as: PRILOSEC Take 1 capsule (20 mg total) by mouth daily.   tamsulosin 0.4 MG Caps capsule Commonly known as: FLOMAX Take 2 capsules daily after supper   Vitamin D 1000 units capsule Take 1,000 Units by mouth in the morning.         Objective:   BP (!) 149/84   Pulse 62   Ht 5\' 8"   (1.727 m)   Wt 232 lb (105.2 kg)   SpO2 95%   BMI 35.28 kg/m   Wt Readings from Last 3 Encounters:  08/28/21 232 lb (105.2 kg)  05/23/21 236 lb (107 kg)  05/21/21 238 lb (108 kg)    Physical Exam Vitals and nursing note reviewed.  Constitutional:      General: He is not in acute distress.    Appearance: He is well-developed. He is not diaphoretic.  Eyes:     General: No scleral icterus.    Conjunctiva/sclera: Conjunctivae normal.  Neck:     Thyroid: No thyromegaly.  Cardiovascular:     Rate and Rhythm: Normal rate. Rhythm irregular.     Heart sounds: Normal heart sounds. No murmur heard. Pulmonary:     Effort: Pulmonary effort is normal. No respiratory distress.     Breath sounds: Normal breath sounds. No wheezing.  Musculoskeletal:        General: Normal range of motion.     Cervical back: Neck supple.  Lymphadenopathy:     Cervical: No cervical adenopathy.  Skin:    General: Skin is warm and dry.     Findings: No rash.  Neurological:     Mental Status: He is alert and oriented to person, place, and time.     Coordination: Coordination normal.  Psychiatric:        Behavior: Behavior normal.      Assessment & Plan:   Problem List Items Addressed This Visit       Cardiovascular and Mediastinum   Essential hypertension   Persistent atrial fibrillation (HCC)     Endocrine   Hyperlipidemia associated with type 2 diabetes mellitus (HCC)   Type 2 diabetes mellitus (HCC) - Primary   Relevant Orders   Bayer DCA Hb A1c Waived    A1c is still pending, will watch for Patient's blood pressure runs typically good at home, he will continue to monitor at home.  Slightly elevated here but will based off of the home measures and monitor closely Follow up plan: Return in about 3 months (around 11/27/2021), or if symptoms worsen or fail to improve, for Diabetes and hypertension and A. fib and cholesterol.  Counseling provided for all of the vaccine components Orders  Placed This Encounter  Procedures   Bayer DCA Hb A1c Waived    11/29/2021, MD [facility] 08/28/2021, 8:37 AM

## 2021-09-12 ENCOUNTER — Telehealth: Payer: Self-pay | Admitting: Family Medicine

## 2021-09-12 NOTE — Telephone Encounter (Signed)
Dettinger,  Pt believes he is in the donut hole. I have spoke with Raynelle Fanning and she states that she can't really help with Eliquis.   We have some samples that I left up front for pt. Enough for 3 weeks. He takes 5mg  BID.  Pt is wondering if the could start taking one per day??  States that he has an appt with is heart doctor in December.

## 2021-09-12 NOTE — Telephone Encounter (Signed)
Aware of recommendations  

## 2021-09-12 NOTE — Telephone Encounter (Signed)
He may have to call up his cardiologist and discuss other options if he cannot keep on the Eliquis.

## 2021-09-12 NOTE — Telephone Encounter (Signed)
Give him samples and then see if he can call his cardiologist and see if they have other samples.  Try to keep it at 5 mg twice a day if possible.

## 2021-10-14 ENCOUNTER — Other Ambulatory Visit: Payer: Self-pay | Admitting: Family Medicine

## 2021-10-14 DIAGNOSIS — E1159 Type 2 diabetes mellitus with other circulatory complications: Secondary | ICD-10-CM

## 2021-11-28 ENCOUNTER — Ambulatory Visit: Payer: Medicare Other | Admitting: Family Medicine

## 2021-12-05 ENCOUNTER — Other Ambulatory Visit: Payer: Self-pay | Admitting: *Deleted

## 2021-12-05 DIAGNOSIS — E1169 Type 2 diabetes mellitus with other specified complication: Secondary | ICD-10-CM

## 2021-12-05 DIAGNOSIS — I4819 Other persistent atrial fibrillation: Secondary | ICD-10-CM

## 2021-12-05 DIAGNOSIS — I10 Essential (primary) hypertension: Secondary | ICD-10-CM

## 2021-12-06 ENCOUNTER — Other Ambulatory Visit: Payer: Self-pay

## 2021-12-06 ENCOUNTER — Encounter: Payer: Self-pay | Admitting: Family Medicine

## 2021-12-06 ENCOUNTER — Ambulatory Visit (INDEPENDENT_AMBULATORY_CARE_PROVIDER_SITE_OTHER): Payer: Medicare Other | Admitting: Family Medicine

## 2021-12-06 VITALS — BP 151/82 | HR 75 | Ht 68.0 in | Wt 235.0 lb

## 2021-12-06 DIAGNOSIS — E785 Hyperlipidemia, unspecified: Secondary | ICD-10-CM

## 2021-12-06 DIAGNOSIS — I10 Essential (primary) hypertension: Secondary | ICD-10-CM

## 2021-12-06 DIAGNOSIS — Z23 Encounter for immunization: Secondary | ICD-10-CM | POA: Diagnosis not present

## 2021-12-06 DIAGNOSIS — E1169 Type 2 diabetes mellitus with other specified complication: Secondary | ICD-10-CM

## 2021-12-06 LAB — CBC WITH DIFFERENTIAL/PLATELET
Basophils Absolute: 0.1 10*3/uL (ref 0.0–0.2)
Basos: 1 %
EOS (ABSOLUTE): 0.2 10*3/uL (ref 0.0–0.4)
Eos: 3 %
Hematocrit: 47.1 % (ref 37.5–51.0)
Hemoglobin: 16.1 g/dL (ref 13.0–17.7)
Immature Grans (Abs): 0 10*3/uL (ref 0.0–0.1)
Immature Granulocytes: 0 %
Lymphocytes Absolute: 1.7 10*3/uL (ref 0.7–3.1)
Lymphs: 25 %
MCH: 29.7 pg (ref 26.6–33.0)
MCHC: 34.2 g/dL (ref 31.5–35.7)
MCV: 87 fL (ref 79–97)
Monocytes Absolute: 0.6 10*3/uL (ref 0.1–0.9)
Monocytes: 9 %
Neutrophils Absolute: 4.1 10*3/uL (ref 1.4–7.0)
Neutrophils: 62 %
Platelets: 143 10*3/uL — ABNORMAL LOW (ref 150–450)
RBC: 5.42 x10E6/uL (ref 4.14–5.80)
RDW: 12.5 % (ref 11.6–15.4)
WBC: 6.6 10*3/uL (ref 3.4–10.8)

## 2021-12-06 LAB — CMP14+EGFR
ALT: 14 IU/L (ref 0–44)
AST: 14 IU/L (ref 0–40)
Albumin/Globulin Ratio: 2 (ref 1.2–2.2)
Albumin: 3.8 g/dL (ref 3.7–4.7)
Alkaline Phosphatase: 93 IU/L (ref 44–121)
BUN/Creatinine Ratio: 12 (ref 10–24)
BUN: 12 mg/dL (ref 8–27)
Bilirubin Total: 1.2 mg/dL (ref 0.0–1.2)
CO2: 25 mmol/L (ref 20–29)
Calcium: 9 mg/dL (ref 8.6–10.2)
Chloride: 103 mmol/L (ref 96–106)
Creatinine, Ser: 0.97 mg/dL (ref 0.76–1.27)
Globulin, Total: 1.9 g/dL (ref 1.5–4.5)
Glucose: 125 mg/dL — ABNORMAL HIGH (ref 70–99)
Potassium: 4.1 mmol/L (ref 3.5–5.2)
Sodium: 141 mmol/L (ref 134–144)
Total Protein: 5.7 g/dL — ABNORMAL LOW (ref 6.0–8.5)
eGFR: 79 mL/min/{1.73_m2} (ref >59)

## 2021-12-06 LAB — LIPID PANEL
Chol/HDL Ratio: 3 ratio (ref 0.0–5.0)
Cholesterol, Total: 99 mg/dL — ABNORMAL LOW (ref 100–199)
HDL: 33 mg/dL — ABNORMAL LOW (ref >39)
LDL Chol Calc (NIH): 46 mg/dL (ref 0–99)
Triglycerides: 108 mg/dL (ref 0–149)
VLDL Cholesterol Cal: 20 mg/dL (ref 5–40)

## 2021-12-06 LAB — BAYER DCA HB A1C WAIVED: HB A1C (BAYER DCA - WAIVED): 6.4 % — ABNORMAL HIGH (ref 4.8–5.6)

## 2021-12-06 MED ORDER — MECLIZINE HCL 25 MG PO TABS: 25.0000 mg | ORAL_TABLET | Freq: Three times a day (TID) | ORAL | 0 refills | Status: DC | PRN

## 2021-12-06 NOTE — Progress Notes (Signed)
BP (!) 151/82   Pulse 75   Ht 5\' 8"  (1.727 m)   Wt 235 lb (106.6 kg)   SpO2 97%   BMI 35.73 kg/m    Subjective:   Patient ID: Vincent Collins, male    DOB: 1941-01-25, 80 y.o.   MRN: [id number]  HPI: Vincent Collins is a 80 y.o. male presenting on 12/06/2021 for Medical Management of Chronic Issues and Diabetes   HPI Type 2 diabetes mellitus Patient comes in today for recheck of his diabetes. Patient has been currently taking metformin, A1c is good at 6.4. Patient is currently on an ACE inhibitor/ARB. Patient has seen an ophthalmologist this year. Patient denies any issues with their feet. The symptom started onset as an adult hypertension and hyperlipidemia ARE RELATED TO DM   Hypertension Patient is currently on lisinopril and metoprolol, and their blood pressure today is 159/77 and recheck is 151/82. Patient denies any lightheadedness or dizziness. Patient denies headaches, blurred vision, chest pains, shortness of breath, or weakness. Denies any side effects from medication and is content with current medication.   Hyperlipidemia Patient is coming in for recheck of his hyperlipidemia. The patient is currently taking atorvastatin. They deny any issues with myalgias or history of liver damage from it. They deny any focal numbness or weakness or chest pain.   Relevant past medical, surgical, family and social history reviewed and updated as indicated. Interim medical history since our last visit reviewed. Allergies and medications reviewed and updated.  Review of Systems  Constitutional:  Negative for chills and fever.  Eyes:  Negative for visual disturbance.  Respiratory:  Negative for shortness of breath and wheezing.   Cardiovascular:  Negative for chest pain and leg swelling.  Musculoskeletal:  Negative for back pain and gait problem.  Skin:  Negative for rash.  Neurological:  Negative for dizziness, weakness and light-headedness.  All other systems reviewed and are  negative.  Per HPI unless specifically indicated above   Allergies as of 12/06/2021   No Known Allergies      Medication List        Accurate as of December 06, 2021 11:35 AM. If you have any questions, ask your nurse or doctor.          acetaminophen 325 MG tablet Commonly known as: TYLENOL Take 2 tablets (650 mg total) by mouth every 6 (six) hours as needed for mild pain (or Fever >/= 101).   albuterol (2.5 MG/3ML) 0.083% nebulizer solution Commonly known as: PROVENTIL Take 3 mLs (2.5 mg total) by nebulization every 6 (six) hours as needed for wheezing or shortness of breath. USE 1 VIAL VIA NEBULIZER 4&amp;nbsp;&amp;nbsp;TIMES DAILY AS NEEDED FOR&amp;nbsp;&amp;nbsp;WHEEZING OR SHORTNESS OF&amp;nbsp;&amp;nbsp;BREATH   apixaban 5 MG Tabs tablet Commonly known as: Eliquis Take 1 tablet (5 mg total) by mouth 2 (two) times daily.   atorvastatin 40 MG tablet Commonly known as: LIPITOR Take 1 tablet (40 mg total) by mouth daily.   CO-Q 10 Omega-3 Fish Oil Caps Take 1 capsule by mouth in the morning. 100mg  Capsule Qunol   fluticasone 50 MCG/ACT nasal spray Commonly known as: FLONASE 1 spray per nostril daily   lisinopril 20 MG tablet Commonly known as: ZESTRIL TAKE 1 TABLET BY MOUTH  DAILY   meclizine 25 MG tablet Commonly known as: ANTIVERT Take 1 tablet (25 mg total) by mouth 3 (three) times daily as needed for dizziness. Started by: Nils Pyle, MD   metFORMIN 500 MG 24 hr tablet Commonly  known as: GLUCOPHAGE-XR Take 1 tablet (500 mg total) by mouth 2 (two) times daily.   metoprolol succinate 50 MG 24 hr tablet Commonly known as: TOPROL-XL TAKE 1 TABLET BY MOUTH  DAILY WITH OR IMMEDIATELY  FOLLOWING A MEAL   omeprazole 20 MG capsule Commonly known as: PRILOSEC Take 1 capsule (20 mg total) by mouth daily.   tamsulosin 0.4 MG Caps capsule Commonly known as: FLOMAX Take 2 capsules daily after supper   Vitamin D 1000 units capsule Take 1,000 Units by  mouth in the morning.         Objective:   BP (!) 151/82   Pulse 75   Ht 5\' 8"  (1.727 m)   Wt 235 lb (106.6 kg)   SpO2 97%   BMI 35.73 kg/m   Wt Readings from Last 3 Encounters:  12/06/21 235 lb (106.6 kg)  08/28/21 232 lb (105.2 kg)  05/23/21 236 lb (107 kg)    Physical Exam Vitals and nursing note reviewed.  Constitutional:      General: He is not in acute distress.    Appearance: He is well-developed. He is not diaphoretic.  Eyes:     General: No scleral icterus.    Conjunctiva/sclera: Conjunctivae normal.  Cardiovascular:     Rate and Rhythm: Normal rate and regular rhythm.     Heart sounds: Normal heart sounds. No murmur heard. Pulmonary:     Effort: Pulmonary effort is normal. No respiratory distress.     Breath sounds: Normal breath sounds. No wheezing.  Musculoskeletal:        General: Normal range of motion.     Cervical back: Neck supple.  Skin:    General: Skin is warm and dry.     Findings: No rash.  Neurological:     Mental Status: He is alert and oriented to person, place, and time.     Coordination: Coordination normal.  Psychiatric:        Behavior: Behavior normal.      Assessment & Plan:   Problem List Items Addressed This Visit       Cardiovascular and Mediastinum   Essential hypertension     Endocrine   Hyperlipidemia associated with type 2 diabetes mellitus (HCC)   Type 2 diabetes mellitus (HCC)    Diabetes looks good at 6.4, hypertension is elevated but it runs in the 140s normally at home.  Allowing permissive hypertension because of age.  Patient gets a little bit of vertigo, he likes to keep meclizine on hand Follow up plan: Return in about 3 months (around 03/06/2022), or if symptoms worsen or fail to improve, for Diabetes and hypertension cholesterol.  Counseling provided for all of the vaccine components No orders of the defined types were placed in this encounter.   05/06/2022, MD [facility] 12/06/2021, 11:35 AM

## 2022-01-01 ENCOUNTER — Other Ambulatory Visit: Payer: Self-pay | Admitting: Cardiovascular Disease

## 2022-01-01 NOTE — Telephone Encounter (Signed)
Prescription refill request for Eliquis received. Indication:Afib Last office visit:upcoming Scr:0.9 Age: 81 Weight:106.6 kg  Prescription refilled

## 2022-01-16 ENCOUNTER — Ambulatory Visit: Payer: Medicare Other | Admitting: Cardiovascular Disease

## 2022-02-06 NOTE — Progress Notes (Signed)
Cardiology Office Note:   Date:  02/07/2022  NAME:  Vincent Collins    MRN: [id number] DOB:  November 11, 1941   PCP:  Dettinger, Fransisca Kaufmann, MD  Cardiologist:  Evalina Field, MD  Electrophysiologist:  None   Referring MD: Dettinger, Fransisca Kaufmann, MD   Chief Complaint  Patient presents with   Follow-up        History of Present Illness:   Vincent Collins is a 81 y.o. male with a hx of persistent A-fib, hypertension, diabetes who presents for follow-up.  Diagnosed with atrial fibrillation in the setting of acute pancreatitis in 2021.  EKG shows he is maintaining sinus rhythm.  Presents with his wife.  Denies any symptoms in office today.  No chest pain or trouble breathing.  Still working for [employer].  Blood pressure elevated today.  158/88.  Only on lisinopril 20 mg daily metoprolol succinate 50 mg daily.  We discussed increasing his lisinopril.  He is okay to do this.  A1c well controlled.  Cholesterol well controlled.  He reports he worked on his diet.  Overall doing well without complaints in office.  CV exam is normal.  Problem List 1. Persistent Afib -diagnosed 03/27/2020 2/2 acute pancreatitis -back in NSR with short course of amiodarone  -CHADSVASC = 4 (DM, HTN, age) 2. DM -A1c 6.9 3. HTN 4. COPD 5. HLD -T chol 99, HDL 33, LDL 46, TG 108  Past Medical History: Past Medical History:  Diagnosis Date   Arrhythmia    Broken leg and ribs, right, closed, initial encounter    COPD (chronic obstructive pulmonary disease) ([place])    Diabetes mellitus without complication ([place])    Diverticulitis    GERD (gastroesophageal reflux disease)    Hydronephrosis of left kidney    Hyperlipidemia    Hypertension    Left ureteral stone    obstructing   Renal calculus or stone 2015    Past Surgical History: Past Surgical History:  Procedure Laterality Date   2d echo  04/04/09   CHOLECYSTECTOMY N/A 08/03/2020   Procedure: LAPAROSCOPIC CHOLECYSTECTOMY WITH INTRAOPERATIVE  CHOLANGIOGRAM;  Surgeon: Greer Pickerel, MD;  Location: [facility];  Service: General;  Laterality: N/A;   CYSTOSCOPY WITH RETROGRADE PYELOGRAM, URETEROSCOPY AND STENT PLACEMENT Left 10/11/2014   Procedure: CYSTOSCOPY WITH RETROGRADE PYELOGRAM, DIAGNOSTIC URETEROSCOPY AND STENT PLACEMENT;  Surgeon: Alexis Frock, MD;  Location[facility];  Service: Urology;  Laterality: Left;   LUMBAR DISC SURGERY     SPINE SURGERY      Current Medications: Current Meds  Medication Sig   acetaminophen (TYLENOL) 325 MG tablet Take 2 tablets (650 mg total) by mouth every 6 (six) hours as needed for mild pain (or Fever >/= 101).   albuterol (PROVENTIL) (2.5 MG/3ML) 0.083% nebulizer solution Take 3 mLs (2.5 mg total) by nebulization every 6 (six) hours as needed for wheezing or shortness of breath. USE 1 VIAL VIA NEBULIZER 4&amp;nbsp;&amp;nbsp;TIMES DAILY AS NEEDED FOR&amp;nbsp;&amp;nbsp;WHEEZING OR SHORTNESS OF&amp;nbsp;&amp;nbsp;BREATH   apixaban (ELIQUIS) 5 MG TABS tablet Take 1 tablet by mouth twice daily   atorvastatin (LIPITOR) 40 MG tablet Take 1 tablet (40 mg total) by mouth daily.   Cholecalciferol (VITAMIN D) 1000 UNITS capsule Take 1,000 Units by mouth in the morning.   Coenzyme Q10-Fish Oil-Vit E (CO-Q 10 OMEGA-3 FISH OIL) CAPS Take 1 capsule by mouth in the morning. 100mg  Capsule Qunol   fluticasone (FLONASE) 50 MCG/ACT nasal spray 1 spray per nostril daily   meclizine (ANTIVERT)  25 MG tablet Take 1 tablet (25 mg total) by mouth 3 (three) times daily as needed for dizziness.   metFORMIN (GLUCOPHAGE-XR) 500 MG 24 hr tablet Take 1 tablet (500 mg total) by mouth 2 (two) times daily.   metoprolol succinate (TOPROL-XL) 50 MG 24 hr tablet TAKE 1 TABLET BY MOUTH  DAILY WITH OR IMMEDIATELY  FOLLOWING A MEAL   omeprazole (PRILOSEC) 20 MG capsule Take 1 capsule (20 mg total) by mouth daily.   tamsulosin (FLOMAX) 0.4 MG CAPS capsule Take 2 capsules daily after supper   [DISCONTINUED] lisinopril  (ZESTRIL) 20 MG tablet TAKE 1 TABLET BY MOUTH  DAILY     Allergies:    Patient has no known allergies.   Social History: Social History   Socioeconomic History   Marital status: Married    Spouse name: Izora Gala   Number of children: 2   Years of education: 8th   Highest education level: 8th grade  Occupational History   Not on file  Tobacco Use   Smoking status: Former    Types: Cigarettes    Quit date: 06/18/1967    Years since quitting: 61.6   Smokeless tobacco: Former    Types: Nurse, children's Use: Never used  Substance and Sexual Activity   Alcohol use: No   Drug use: No   Sexual activity: Not Currently  Other Topics Concern   Not on file  Social History Narrative   Lives home with wife - still working part time   Social Determinants of Radio broadcast assistant Strain: Low Risk    Difficulty of Paying Living Expenses: Not hard at all  Food Insecurity: No Food Insecurity   Worried About Charity fundraiser in the Last Year: Never true   Arboriculturist in the Last Year: Never true  Transportation Needs: No Transportation Needs   Lack of Transportation (Medical): No   Lack of Transportation (Non-Medical): No  Physical Activity: Sufficiently Active   Days of Exercise per Week: 4 days   Minutes of Exercise per Session: 40 min  Stress: No Stress Concern Present   Feeling of Stress : Not at all  Social Connections: Socially Integrated   Frequency of Communication with Friends and Family: More than three times a week   Frequency of Social Gatherings with Friends and Family: More than three times a week   Attends Religious Services: 1 to 4 times per year   Active Member of [employer] or Organizations: Yes   Attends Archivist Meetings: 1 to 4 times per year   Marital Status: Married     Family History: The patient's family history includes COPD in his brother; Cancer in his sister; Early death in his father; Heart attack in his father.  ROS:    All other ROS reviewed and negative. Pertinent positives noted in the HPI.     EKGs/Labs/Other Studies Reviewed:   The following studies were personally reviewed by me today:  EKG:  EKG is ordered today.  The ekg ordered today demonstrates normal sinus rhythm heart rate 61, first-degree block, no acute ischemic changes or evidence of infarction, and was personally reviewed by me.   TTE 03/27/2020  1. Left ventricular ejection fraction, by estimation, is 65 to 70%. The  left ventricle has normal function. The left ventricle has no regional  wall motion abnormalities. There is moderate left ventricular hypertrophy.  Left ventricular diastolic function   could not be evaluated.  2. Right ventricular systolic function is normal. The right ventricular  size is normal.   3. Left atrial size was severely dilated.   4. The mitral valve was not well visualized. No evidence of mitral valve  regurgitation.   5. The aortic valve was not well visualized. Aortic valve regurgitation  is not visualized.   6. Aortic dilatation noted. There is moderate dilatation of the ascending  aorta measuring 44 mm.   7. The inferior vena cava is normal in size with greater than 50%  respiratory variability, suggesting right atrial pressure of 3 mmHg.   Recent Labs: 12/06/2021: ALT 14; BUN 12; Creatinine, Ser 0.97; Hemoglobin 16.1; Platelets 143; Potassium 4.1; Sodium 141   Recent Lipid Panel    Component Value Date/Time   CHOL 99 (L) 12/06/2021 0846   TRIG 108 12/06/2021 0846   TRIG 97 11/02/2013 0949   HDL 33 (L) 12/06/2021 0846   HDL 40 11/02/2013 0949   CHOLHDL 3.0 12/06/2021 0846   CHOLHDL 2.4 09/21/2014 0620   VLDL 21 09/21/2014 0620   LDLCALC 46 12/06/2021 0846   LDLCALC 59 11/02/2013 0949    Physical Exam:   VS:  BP (!) 158/88    Pulse 61    Ht 5\' 8"  (1.727 m)    Wt 233 lb (105.7 kg)    SpO2 96%    BMI 35.43 kg/m    Wt Readings from Last 3 Encounters:  02/07/22 233 lb (105.7 kg)  12/06/21  235 lb (106.6 kg)  08/28/21 232 lb (105.2 kg)    General: Well nourished, well developed, in no acute distress Head: Atraumatic, normal size  Eyes: PEERLA, EOMI  Neck: Supple, no JVD Endocrine: No thryomegaly Cardiac: Normal S1, S2; RRR; no murmurs, rubs, or gallops Lungs: Clear to auscultation bilaterally, no wheezing, rhonchi or rales  Abd: Soft, nontender, no hepatomegaly  Ext: No edema, pulses 2+ Musculoskeletal: No deformities, BUE and BLE strength normal and equal Skin: Warm and dry, no rashes   Neuro: Alert and oriented to person, place, time, and situation, CNII-XII grossly intact, no focal deficits  Psych: Normal mood and affect   ASSESSMENT:   KRUZE VOLZ is a 81 y.o. male who presents for the following: 1. Persistent atrial fibrillation ([place])   2. Acquired thrombophilia ([place])   3. Essential hypertension   4. Mixed hyperlipidemia   5. Hypertension associated with diabetes ([place])     PLAN:   1. Persistent atrial fibrillation ([place]) 2. Acquired thrombophilia ([place]) -Diagnosed with atrial fibrillation in 2021 secondary to acute pancreatitis.  Completed a short course of amiodarone and has maintained sinus rhythm.  EKG today shows sinus rhythm.  He has been weaned off amiodarone.  Due to CHA2DS2-VASc score of 4 he will continue Eliquis 5 mg twice daily.  No bleeding issues on this medication.  3. Essential hypertension -BP elevated today.  We will increase his lisinopril to 40 mg daily.  Continue metoprolol succinate 50 mg daily.  4. Mixed hyperlipidemia -Most recent LDL cholesterol at goal.  He is diabetic.  A1c 6.4.  Most recent LDL 46.  He will continue his Lipitor 40 mg daily.  Disposition: Return in about 1 year (around 02/07/2023).  Medication Adjustments/Labs and Tests Ordered: Current medicines are reviewed at length with the patient today.  Concerns regarding medicines are outlined above.  Orders Placed This Encounter  Procedures   EKG 12-Lead   Meds ordered  this encounter  Medications   lisinopril (ZESTRIL) 40 MG tablet  Sig: Take 1 tablet (40 mg total) by mouth daily.    Dispense:  90 tablet    Refill:  1    Patient Instructions  Medication Instructions:  Increase Lisinopril to 40 mg daily   *If you need a refill on your cardiac medications before your next appointment, please call your pharmacy*  Follow-Up: At [facility], you and your health needs are our priority.  As part of our continuing mission to provide you with exceptional heart care, we have created designated Provider Care Teams.  These Care Teams include your primary Cardiologist (physician) and Advanced Practice Providers (APPs -  Physician Assistants and Nurse Practitioners) who all work together to provide you with the care you need, when you need it.  We recommend signing up for the patient portal called "MyChart".  Sign up information is provided on this After Visit Summary.  MyChart is used to connect with patients for Virtual Visits (Telemedicine).  Patients are able to view lab/test results, encounter notes, upcoming appointments, etc.  Non-urgent messages can be sent to your provider as well.   To learn more about what you can do with MyChart, go to [email or web address].    Your next appointment:   12 month(s)  The format for your next appointment:   In Person  Provider:   Evalina Field, MD       Time Spent with Patient: I have spent a total of 35 minutes with patient reviewing hospital notes, telemetry, EKGs, labs and examining the patient as well as establishing an assessment and plan that was discussed with the patient.  > 50% of time was spent in direct patient care.  Signed, Addison Naegeli. Audie Box, MD, [place]  [address], [place], [place] [address] [phone]  02/07/2022 4:32 PM

## 2022-02-07 ENCOUNTER — Encounter: Payer: Self-pay | Admitting: Cardiovascular Disease

## 2022-02-07 ENCOUNTER — Other Ambulatory Visit: Payer: Self-pay

## 2022-02-07 ENCOUNTER — Ambulatory Visit: Payer: Medicare Other | Admitting: Cardiovascular Disease

## 2022-02-07 VITALS — BP 158/88 | HR 61 | Ht 68.0 in | Wt 233.0 lb

## 2022-02-07 DIAGNOSIS — E782 Mixed hyperlipidemia: Secondary | ICD-10-CM | POA: Diagnosis not present

## 2022-02-07 DIAGNOSIS — E1159 Type 2 diabetes mellitus with other circulatory complications: Secondary | ICD-10-CM | POA: Diagnosis not present

## 2022-02-07 DIAGNOSIS — I4819 Other persistent atrial fibrillation: Secondary | ICD-10-CM | POA: Diagnosis not present

## 2022-02-07 DIAGNOSIS — D6869 Other thrombophilia: Secondary | ICD-10-CM

## 2022-02-07 DIAGNOSIS — I10 Essential (primary) hypertension: Secondary | ICD-10-CM | POA: Diagnosis not present

## 2022-02-07 DIAGNOSIS — I152 Hypertension secondary to endocrine disorders: Secondary | ICD-10-CM

## 2022-02-07 MED ORDER — LISINOPRIL 40 MG PO TABS: 40.0000 mg | ORAL_TABLET | Freq: Every day | ORAL | 1 refills | Status: DC

## 2022-02-07 NOTE — Patient Instructions (Signed)
Medication Instructions:  Increase Lisinopril to 40 mg daily   *If you need a refill on your cardiac medications before your next appointment, please call your pharmacy*  Follow-Up: At [facility], you and your health needs are our priority.  As part of our continuing mission to provide you with exceptional heart care, we have created designated Provider Care Teams.  These Care Teams include your primary Cardiologist (physician) and Advanced Practice Providers (APPs -  Physician Assistants and Nurse Practitioners) who all work together to provide you with the care you need, when you need it.  We recommend signing up for the patient portal called "MyChart".  Sign up information is provided on this After Visit Summary.  MyChart is used to connect with patients for Virtual Visits (Telemedicine).  Patients are able to view lab/test results, encounter notes, upcoming appointments, etc.  Non-urgent messages can be sent to your provider as well.   To learn more about what you can do with MyChart, go to [email or web address].    Your next appointment:   12 month(s)  The format for your next appointment:   In Person  Provider:   Evalina Field, MD

## 2022-02-25 DIAGNOSIS — H40033 Anatomical narrow angle, bilateral: Secondary | ICD-10-CM | POA: Diagnosis not present

## 2022-02-25 DIAGNOSIS — E119 Type 2 diabetes mellitus without complications: Secondary | ICD-10-CM | POA: Diagnosis not present

## 2022-02-25 LAB — HM DIABETES EYE EXAM

## 2022-03-06 ENCOUNTER — Other Ambulatory Visit: Payer: Self-pay | Admitting: Family Medicine

## 2022-03-06 DIAGNOSIS — K2950 Unspecified chronic gastritis without bleeding: Secondary | ICD-10-CM

## 2022-03-06 DIAGNOSIS — E1159 Type 2 diabetes mellitus with other circulatory complications: Secondary | ICD-10-CM

## 2022-03-06 DIAGNOSIS — I152 Hypertension secondary to endocrine disorders: Secondary | ICD-10-CM

## 2022-03-10 ENCOUNTER — Encounter: Payer: Self-pay | Admitting: Family Medicine

## 2022-03-10 ENCOUNTER — Ambulatory Visit (INDEPENDENT_AMBULATORY_CARE_PROVIDER_SITE_OTHER): Payer: Medicare Other | Admitting: Family Medicine

## 2022-03-10 VITALS — BP 183/80 | HR 57 | Wt 228.0 lb

## 2022-03-10 DIAGNOSIS — E1169 Type 2 diabetes mellitus with other specified complication: Secondary | ICD-10-CM | POA: Diagnosis not present

## 2022-03-10 DIAGNOSIS — J449 Chronic obstructive pulmonary disease, unspecified: Secondary | ICD-10-CM

## 2022-03-10 DIAGNOSIS — I4819 Other persistent atrial fibrillation: Secondary | ICD-10-CM

## 2022-03-10 DIAGNOSIS — N401 Enlarged prostate with lower urinary tract symptoms: Secondary | ICD-10-CM | POA: Diagnosis not present

## 2022-03-10 DIAGNOSIS — Z23 Encounter for immunization: Secondary | ICD-10-CM | POA: Diagnosis not present

## 2022-03-10 DIAGNOSIS — I10 Essential (primary) hypertension: Secondary | ICD-10-CM

## 2022-03-10 DIAGNOSIS — R351 Nocturia: Secondary | ICD-10-CM

## 2022-03-10 DIAGNOSIS — E785 Hyperlipidemia, unspecified: Secondary | ICD-10-CM | POA: Diagnosis not present

## 2022-03-10 DIAGNOSIS — K2950 Unspecified chronic gastritis without bleeding: Secondary | ICD-10-CM

## 2022-03-10 LAB — BAYER DCA HB A1C WAIVED: HB A1C (BAYER DCA - WAIVED): 6.4 % — ABNORMAL HIGH (ref 4.8–5.6)

## 2022-03-10 MED ORDER — TAMSULOSIN HCL 0.4 MG PO CAPS: ORAL_CAPSULE | ORAL | 3 refills | Status: DC

## 2022-03-10 MED ORDER — METFORMIN HCL ER 500 MG PO TB24: 500.0000 mg | ORAL_TABLET | Freq: Two times a day (BID) | ORAL | 3 refills | Status: DC

## 2022-03-10 MED ORDER — METOPROLOL SUCCINATE ER 50 MG PO TB24: 50.0000 mg | ORAL_TABLET | Freq: Every day | ORAL | 3 refills | Status: DC

## 2022-03-10 MED ORDER — ALBUTEROL SULFATE (2.5 MG/3ML) 0.083% IN NEBU: 2.5000 mg | INHALATION_SOLUTION | Freq: Four times a day (QID) | RESPIRATORY_TRACT | 2 refills | Status: DC | PRN

## 2022-03-10 MED ORDER — OMEPRAZOLE 20 MG PO CPDR: 20.0000 mg | DELAYED_RELEASE_CAPSULE | Freq: Every day | ORAL | 3 refills | Status: DC

## 2022-03-10 MED ORDER — ATORVASTATIN CALCIUM 40 MG PO TABS: 40.0000 mg | ORAL_TABLET | Freq: Every day | ORAL | 3 refills | Status: DC

## 2022-03-10 NOTE — Addendum Note (Signed)
Addended by: Dorene Sorrow on: 03/10/2022 09:08 AM ? ? Modules accepted: Orders ? ?

## 2022-03-10 NOTE — Progress Notes (Signed)
? ?BP (!) 183/80   Pulse (!) 57   Wt 228 lb (103.4 kg)   SpO2 96%   BMI 34.67 kg/m?   ? ?Subjective:  ? ?Patient ID: Vincent Collins, male    DOB: [birth year], 81 y.o.   MRN: [id number] ? ?HPI: ?Vincent Collins is a 81 y.o. male presenting on 03/10/2022 for Medical Management of Chronic Issues and Diabetes ? ? ?HPI ?Type 2 diabetes mellitus ?Patient comes in today for recheck of his diabetes. Patient has been currently taking metformin, A1c looks good at 6.4. Patient is currently on an ACE inhibitor/ARB. Patient has not seen an ophthalmologist this year. Patient denies any issues with their feet. The symptom started onset as an adult hypertension and hyperlipidemia ARE RELATED TO DM ? ?Hypertension ?Patient is currently on metoprolol and lisinopril, recently increased by cardiology lisinopril to 40 mg within the past few weeks, and their blood pressure today is 190/82 and recheck is 153/77. Patient denies any lightheadedness or dizziness. Patient denies headaches, blurred vision, chest pains, shortness of breath, or weakness. Denies any side effects from medication and is content with current medication.  ? ?Hyperlipidemia ?Patient is coming in for recheck of his hyperlipidemia. The patient is currently taking atorvastatin. They deny any issues with myalgias or history of liver damage from it. They deny any focal numbness or weakness or chest pain.  ? ?BPH ?Patient is coming in for recheck on BPH ?Symptoms: None currently ?Medication: Flomax ?Last PSA: 05/03/2021, was 1.8 ? ?COPD ?Patient is coming in for COPD recheck today.  He is currently on albuterol and Flonase.  He has a mild chronic cough but denies any major coughing spells or wheezing spells.  He has 0 nighttime symptoms per week and 2 daytime symptoms per week currently.  ? ?Relevant past medical, surgical, family and social history reviewed and updated as indicated. Interim medical history since our last visit reviewed. ?Allergies and medications reviewed and  updated. ? ?Review of Systems  ?Constitutional:  Negative for chills and fever.  ?Eyes:  Negative for visual disturbance.  ?Respiratory:  Negative for shortness of breath and wheezing.   ?Cardiovascular:  Negative for chest pain and leg swelling.  ?Musculoskeletal:  Negative for back pain and gait problem.  ?Skin:  Negative for rash.  ?Neurological:  Negative for dizziness and weakness.  ?All other systems reviewed and are negative. ? ?Per HPI unless specifically indicated above ? ? ?Allergies as of 03/10/2022   ?No Known Allergies ?  ? ?  ?Medication List  ?  ? ?  ? Accurate as of March 10, 2022  8:54 AM. If you have any questions, ask your nurse or doctor.  ?  ?  ? ?  ? ?acetaminophen 325 MG tablet ?Commonly known as: TYLENOL ?Take 2 tablets (650 mg total) by mouth every 6 (six) hours as needed for mild pain (or Fever >/= 101). ?  ?albuterol (2.5 MG/3ML) 0.083% nebulizer solution ?Commonly known as: PROVENTIL ?Take 3 mLs (2.5 mg total) by nebulization every 6 (six) hours as needed for wheezing or shortness of breath. USE 1 VIAL VIA NEBULIZER 4&amp;nbsp;&amp;nbsp;TIMES DAILY AS NEEDED FOR&amp;nbsp;&amp;nbsp;WHEEZING OR SHORTNESS OF&amp;nbsp;&amp;nbsp;BREATH ?  ?atorvastatin 40 MG tablet ?Commonly known as: LIPITOR ?Take 1 tablet (40 mg total) by mouth daily. ?  ?CO-Q 10 Omega-3 Fish Oil Caps ?Take 1 capsule by mouth in the morning. 100mg  Capsule Qunol ?  ?Eliquis 5 MG Tabs tablet ?Generic drug: apixaban ?Take 1 tablet by mouth twice daily ?  ?fluticasone  50 MCG/ACT nasal spray ?Commonly known as: FLONASE ?1 spray per nostril daily ?  ?lisinopril 40 MG tablet ?Commonly known as: ZESTRIL ?Take 1 tablet (40 mg total) by mouth daily. ?  ?meclizine 25 MG tablet ?Commonly known as: ANTIVERT ?Take 1 tablet (25 mg total) by mouth 3 (three) times daily as needed for dizziness. ?  ?metFORMIN 500 MG 24 hr tablet ?Commonly known as: GLUCOPHAGE-XR ?Take 1 tablet (500 mg total) by mouth 2 (two) times daily. ?  ?metoprolol  succinate 50 MG 24 hr tablet ?Commonly known as: TOPROL-XL ?Take 1 tablet (50 mg total) by mouth daily. Take with or immediately following a meal. ?What changed: See the new instructions. ?Changed by: [name], MD ?  ?omeprazole 20 MG capsule ?Commonly known as: PRILOSEC ?Take 1 capsule (20 mg total) by mouth daily. ?  ?tamsulosin 0.4 MG Caps capsule ?Commonly known as: FLOMAX ?Take 2 capsules daily after supper ?  ?Vitamin D 1000 units capsule ?Take 1,000 Units by mouth in the morning. ?  ? ?  ? ? ? ?Objective:  ? ?BP (!) 183/80   Pulse (!) 57   Wt 228 lb (103.4 kg)   SpO2 96%   BMI 34.67 kg/m?   ?Wt Readings from Last 3 Encounters:  ?03/10/22 228 lb (103.4 kg)  ?02/07/22 233 lb (105.7 kg)  ?12/06/21 235 lb (106.6 kg)  ?  ?Physical Exam ?Vitals and nursing note reviewed.  ?Constitutional:   ?   General: He is not in acute distress. ?   Appearance: He is well-developed. He is not diaphoretic.  ?Eyes:  ?   General: No scleral icterus. ?   Conjunctiva/sclera: Conjunctivae normal.  ?Neck:  ?   Thyroid: No thyromegaly.  ?Cardiovascular:  ?   Rate and Rhythm: Normal rate. Rhythm irregular.  ?   Heart sounds: Normal heart sounds. No murmur heard. ?Pulmonary:  ?   Effort: Pulmonary effort is normal. No respiratory distress.  ?   Breath sounds: Normal breath sounds. No wheezing.  ?Musculoskeletal:     ?   General: No swelling. Normal range of motion.  ?   Cervical back: Neck supple.  ?Lymphadenopathy:  ?   Cervical: No cervical adenopathy.  ?Skin: ?   General: Skin is warm and dry.  ?   Findings: No rash.  ?Neurological:  ?   Mental Status: He is alert and oriented to person, place, and time.  ?   Coordination: Coordination normal.  ?Psychiatric:     ?   Behavior: Behavior normal.  ? ? ? ? ?Assessment & Plan:  ? ?Problem List Items Addressed This Visit   ? ?  ? Cardiovascular and Mediastinum  ? Essential hypertension  ? Relevant Medications  ? atorvastatin (LIPITOR) 40 MG tablet  ? metoprolol succinate  (TOPROL-XL) 50 MG 24 hr tablet  ? Persistent atrial fibrillation ([place])  ? Relevant Medications  ? atorvastatin (LIPITOR) 40 MG tablet  ? metoprolol succinate (TOPROL-XL) 50 MG 24 hr tablet  ?  ? Respiratory  ? COPD (chronic obstructive pulmonary disease) ([place])  ? Relevant Medications  ? albuterol (PROVENTIL) (2.5 MG/3ML) 0.083% nebulizer solution  ?  ? Endocrine  ? Hyperlipidemia associated with type 2 diabetes mellitus ([place])  ? Relevant Medications  ? metFORMIN (GLUCOPHAGE-XR) 500 MG 24 hr tablet  ? atorvastatin (LIPITOR) 40 MG tablet  ? metoprolol succinate (TOPROL-XL) 50 MG 24 hr tablet  ? Type 2 diabetes mellitus ([place]) - Primary  ? Relevant Medications  ? metFORMIN (GLUCOPHAGE-XR)  500 MG 24 hr tablet  ? atorvastatin (LIPITOR) 40 MG tablet  ? Other Relevant Orders  ? Bayer DCA Hb A1c Waived  ?  ? Genitourinary  ? BPH (benign prostatic hyperplasia)  ? Relevant Medications  ? tamsulosin (FLOMAX) 0.4 MG CAPS capsule  ? ?Other Visit Diagnoses   ? ? Chronic gastritis without bleeding, unspecified gastritis type      ? Relevant Medications  ? omeprazole (PRILOSEC) 20 MG capsule  ? ?  ?A1c looks good at 6.4.  BP up today, just recently increase lisinopril by cardiology a few weeks ago. ? ?Recheck, blood pressure is 153/77, still slightly elevated, he says is running good at home in the 130s.  Keep a close eye on it at home. ? ?Follow up plan: ?Return in about 3 months (around 06/10/2022), or if symptoms worsen or fail to improve, for Diabetes and hypertension recheck. ? ?Counseling provided for all of the vaccine components ?Orders Placed This Encounter  ?Procedures  ? Bayer DCA Hb A1c Waived  ? ? ?Caryl Pina, MD ?[facility] ?03/10/2022, 8:54 AM ? ? ? ? ?

## 2022-03-14 DIAGNOSIS — H25811 Combined forms of age-related cataract, right eye: Secondary | ICD-10-CM | POA: Diagnosis not present

## 2022-03-14 DIAGNOSIS — H40051 Ocular hypertension, right eye: Secondary | ICD-10-CM | POA: Diagnosis not present

## 2022-03-18 ENCOUNTER — Telehealth: Payer: Self-pay

## 2022-03-18 NOTE — Telephone Encounter (Signed)
? ?  Pre-operative Risk Assessment  ?  ?Patient Name: Vincent Collins  ?DOB: [birth year] ?MRN: [id number]  ? ?  ? ?Request for Surgical Clearance   ? ?Procedure:   RIGHT EYE CATARACT EXTRACTION BY PE,IOL & GONIOTOMY ? ?Date of Surgery:  Clearance 03/28/22                              ?   ?Surgeon:  DR Hedda Slade ?Surgeon's Group or Practice Name:  [facility] ?Phone number:  [phone] ?Fax number:  [phone] ?  ?Type of Clearance Requested:   ?- Pharmacy:  Hold Apixaban (Eliquis) 2-3 DAYS BEFORE ?  ?Type of Anesthesia:   IV SEDATION ?  ?Additional requests/questions:   ? ? ? ?

## 2022-03-18 NOTE — Telephone Encounter (Signed)
? ?  Name: Vincent Collins  ?DOB: 1941/08/09  ?MRN: [id number]  ? ?Primary Cardiologist: Reatha Harps, MD ? ?Chart reviewed as part of pre-operative protocol coverage.  ? ?We have been asked for guidance to hold Eliquis for right eye cataract extraction with goniotomy. ? ?Per our clinical pharmacist: ?Patient with diagnosis of A Fib on Eliquis for anticoagulation.   ?  ?Procedure: RIGHT EYE CATARACT EXTRACTION BY PE,IOL & GONIOTOMY ?Date of procedure: 03/28/22         ?  ?  ?CHA2DS2-VASc Score = 4  ?This indicates a 4.8% annual risk of stroke. ?The patient's score is based upon: ?CHF History: 0 ?HTN History: 1 ?Diabetes History: 1 ?Stroke History: 0 ?Vascular Disease History: 0 ?Age Score: 2 ?Gender Score: 0 ?  ?CrCl 72 mL/min using adjusted body weight ?Platelet count 143K ?  ?Per office protocol, patient can hold Eliquis for 2 days prior to procedure.   ? ?I will route this recommendation to the requesting party via Epic fax function and remove from pre-op pool. Please call with questions. ? ?Marcelino Duster, PA ?03/18/2022, 5:19 PM ? ?

## 2022-03-18 NOTE — Telephone Encounter (Signed)
Received additional fax updating phone/fax numbers as follows: ?Fax [phone] phone [phone] x [phone] ?

## 2022-03-18 NOTE — Telephone Encounter (Signed)
Patient with diagnosis of A Fib on Eliquis for anticoagulation.   ? ?Procedure: RIGHT EYE CATARACT EXTRACTION BY PE,IOL & GONIOTOMY ?Date of procedure: 03/28/22         ? ? ?CHA2DS2-VASc Score = 4  ?This indicates a 4.8% annual risk of stroke. ?The patient's score is based upon: ?CHF History: 0 ?HTN History: 1 ?Diabetes History: 1 ?Stroke History: 0 ?Vascular Disease History: 0 ?Age Score: 2 ?Gender Score: 0 ?  ?CrCl 72 mL/min using adjusted body weight ?Platelet count 143K ? ?Per office protocol, patient can hold Eliquis for 2 days prior to procedure.   ?

## 2022-03-28 DIAGNOSIS — H25811 Combined forms of age-related cataract, right eye: Secondary | ICD-10-CM | POA: Diagnosis not present

## 2022-03-28 DIAGNOSIS — H40051 Ocular hypertension, right eye: Secondary | ICD-10-CM | POA: Diagnosis not present

## 2022-04-11 DIAGNOSIS — H2512 Age-related nuclear cataract, left eye: Secondary | ICD-10-CM | POA: Diagnosis not present

## 2022-04-18 DIAGNOSIS — H04123 Dry eye syndrome of bilateral lacrimal glands: Secondary | ICD-10-CM | POA: Diagnosis not present

## 2022-05-02 ENCOUNTER — Telehealth: Payer: Self-pay | Admitting: Family Medicine

## 2022-05-02 DIAGNOSIS — J449 Chronic obstructive pulmonary disease, unspecified: Secondary | ICD-10-CM

## 2022-05-02 NOTE — Telephone Encounter (Signed)
Patient is in need of a new nebulizer machine. Wife stated that [facility] has them and wants to know if a prescription can be sent there. Please call back to let patient know.  ?

## 2022-05-02 NOTE — Telephone Encounter (Signed)
Prescription faxed to [facility]. ?

## 2022-05-02 NOTE — Telephone Encounter (Signed)
Printed prescription for nebulizer machine ?

## 2022-05-02 NOTE — Telephone Encounter (Signed)
Did you fax this already Morrie Sheldon, I know we printed and signed? ?

## 2022-05-05 NOTE — Telephone Encounter (Signed)
Yes, I faxed it twice. It went to [facility]. Tried calling pt today to follow up to see if they were able to pick up. Asked pt to call back if not. ?

## 2022-05-07 ENCOUNTER — Ambulatory Visit (INDEPENDENT_AMBULATORY_CARE_PROVIDER_SITE_OTHER): Payer: Medicare Other | Admitting: Family Medicine

## 2022-05-07 ENCOUNTER — Encounter: Payer: Self-pay | Admitting: Family Medicine

## 2022-05-07 VITALS — BP 161/74 | HR 68 | Temp 96.8°F | Ht 68.0 in | Wt 229.0 lb

## 2022-05-07 DIAGNOSIS — J441 Chronic obstructive pulmonary disease with (acute) exacerbation: Secondary | ICD-10-CM

## 2022-05-07 MED ORDER — METHYLPREDNISOLONE ACETATE 40 MG/ML IJ SUSP
80.0000 mg | Freq: Once | INTRAMUSCULAR | Status: AC
  Administered 2022-05-07: 80 mg via INTRAMUSCULAR

## 2022-05-07 NOTE — Progress Notes (Signed)
? ?BP (!) 161/74   Pulse 68   Temp (!) 96.8 ?F (36 ?C)   Ht 5\' 8"  (1.727 m)   Wt 229 lb (103.9 kg)   SpO2 94%   BMI 34.82 kg/m?   ? ?Subjective:  ? ?Patient ID: Vincent Collins, male    DOB: 05-04-1941, 81 y.o.   MRN: [id number] ? ?HPI: ?Vincent Collins is a 81 y.o. male presenting on 05/07/2022 for Cough (Present for 1 week but getting worse. Some productive cough.) ? ? ?HPI ?Cough and congestion and production. ?Patient is coming in for a productive cough and congestion and drainage that is been going on over the past 3 or 4 days.  He denies any fevers or chills.  He does have some wheezing but denies any shortness of breath.  He just feels like is coming on and has been using his albuterol inhaler and nebulizer and helps but is not clearing it and he wanted to get in before he had significant infection.  He does not know if it is pollen or allergies but he just feels like it is coming on like he gets sometimes this time a year.  He denies any sick contacts that he knows of.  He has been using some Tylenol over-the-counter but otherwise his albuterol and nothing else. ? ?Relevant past medical, surgical, family and social history reviewed and updated as indicated. Interim medical history since our last visit reviewed. ?Allergies and medications reviewed and updated. ? ?Review of Systems  ?Constitutional:  Negative for chills and fever.  ?HENT:  Positive for congestion. Negative for sinus pressure, sinus pain and sore throat.   ?Eyes:  Negative for visual disturbance.  ?Respiratory:  Positive for cough and wheezing. Negative for shortness of breath.   ?Cardiovascular:  Negative for chest pain and leg swelling.  ?Musculoskeletal:  Negative for back pain and gait problem.  ?Skin:  Negative for rash.  ?All other systems reviewed and are negative. ? ?Per HPI unless specifically indicated above ? ? ?Allergies as of 05/07/2022   ?No Known Allergies ?  ? ?  ?Medication List  ?  ? ?  ? Accurate as of May 07, 2022  9:43 AM.  If you have any questions, ask your nurse or doctor.  ?  ?  ? ?  ? ?acetaminophen 325 MG tablet ?Commonly known as: TYLENOL ?Take 2 tablets (650 mg total) by mouth every 6 (six) hours as needed for mild pain (or Fever >/= 101). ?  ?albuterol (2.5 MG/3ML) 0.083% nebulizer solution ?Commonly known as: PROVENTIL ?Take 3 mLs (2.5 mg total) by nebulization every 6 (six) hours as needed for wheezing or shortness of breath. USE 1 VIAL VIA NEBULIZER 4&amp;nbsp;&amp;nbsp;TIMES DAILY AS NEEDED FOR&amp;nbsp;&amp;nbsp;WHEEZING OR SHORTNESS OF&amp;nbsp;&amp;nbsp;BREATH ?  ?atorvastatin 40 MG tablet ?Commonly known as: LIPITOR ?Take 1 tablet (40 mg total) by mouth daily. ?  ?CO-Q 10 Omega-3 Fish Oil Caps ?Take 1 capsule by mouth in the morning. 100mg  Capsule Qunol ?  ?Eliquis 5 MG Tabs tablet ?Generic drug: apixaban ?Take 1 tablet by mouth twice daily ?  ?fluticasone 50 MCG/ACT nasal spray ?Commonly known as: FLONASE ?1 spray per nostril daily ?  ?lisinopril 40 MG tablet ?Commonly known as: ZESTRIL ?Take 1 tablet (40 mg total) by mouth daily. ?  ?meclizine 25 MG tablet ?Commonly known as: ANTIVERT ?Take 1 tablet (25 mg total) by mouth 3 (three) times daily as needed for dizziness. ?  ?metFORMIN 500 MG 24 hr tablet ?Commonly known as: GLUCOPHAGE-XR ?  Take 1 tablet (500 mg total) by mouth 2 (two) times daily. ?  ?metoprolol succinate 50 MG 24 hr tablet ?Commonly known as: TOPROL-XL ?Take 1 tablet (50 mg total) by mouth daily. Take with or immediately following a meal. ?  ?omeprazole 20 MG capsule ?Commonly known as: PRILOSEC ?Take 1 capsule (20 mg total) by mouth daily. ?  ?tamsulosin 0.4 MG Caps capsule ?Commonly known as: FLOMAX ?Take 2 capsules daily after supper ?  ?Vitamin D 1000 units capsule ?Take 1,000 Units by mouth in the morning. ?  ? ?  ? ? ? ?Objective:  ? ?BP (!) 161/74   Pulse 68   Temp (!) 96.8 ?F (36 ?C)   Ht 5\' 8"  (1.727 m)   Wt 229 lb (103.9 kg)   SpO2 94%   BMI 34.82 kg/m?   ?Wt Readings from Last 3  Encounters:  ?05/07/22 229 lb (103.9 kg)  ?03/10/22 228 lb (103.4 kg)  ?02/07/22 233 lb (105.7 kg)  ?  ?Physical Exam ?Vitals and nursing note reviewed.  ?Constitutional:   ?   General: He is not in acute distress. ?   Appearance: He is well-developed. He is not diaphoretic.  ?HENT:  ?   Mouth/Throat:  ?   Mouth: Mucous membranes are moist.  ?   Pharynx: Oropharynx is clear. No oropharyngeal exudate or posterior oropharyngeal erythema.  ?Eyes:  ?   General: No scleral icterus. ?   Conjunctiva/sclera: Conjunctivae normal.  ?Neck:  ?   Thyroid: No thyromegaly.  ?Cardiovascular:  ?   Rate and Rhythm: Normal rate and regular rhythm.  ?   Heart sounds: Normal heart sounds. No murmur heard. ?Pulmonary:  ?   Effort: Pulmonary effort is normal. No respiratory distress.  ?   Breath sounds: Normal breath sounds. No wheezing, rhonchi or rales.  ?Musculoskeletal:     ?   General: Normal range of motion.  ?   Cervical back: Neck supple.  ?Lymphadenopathy:  ?   Cervical: No cervical adenopathy.  ?Skin: ?   General: Skin is warm and dry.  ?   Findings: No rash.  ?Neurological:  ?   Mental Status: He is alert and oriented to person, place, and time.  ?   Coordination: Coordination normal.  ?Psychiatric:     ?   Behavior: Behavior normal.  ? ? ? ? ?Assessment & Plan:  ? ?Problem List Items Addressed This Visit   ?None ?Visit Diagnoses   ? ? COPD exacerbation (HCC)    -  Primary  ? Relevant Medications  ? methylPREDNISolone acetate (DEPO-MEDROL) injection 80 mg (Start on 05/07/2022  9:45 AM)  ? ?  ?  ?Likely early COPD exacerbation due to pollen and allergies, will treat with steroid injection and continue inhalers and can take Mucinex.  Return if anything worsens or does not improve. ?Follow up plan: ?Return if symptoms worsen or fail to improve. ? ?Counseling provided for all of the vaccine components ?No orders of the defined types were placed in this encounter. ? ? ?07/07/2022, MD ?[facility] ?05/07/2022, 9:43 AM ? ? ?  ?

## 2022-05-08 ENCOUNTER — Telehealth: Payer: Self-pay | Admitting: Family Medicine

## 2022-05-08 DIAGNOSIS — J441 Chronic obstructive pulmonary disease with (acute) exacerbation: Secondary | ICD-10-CM

## 2022-05-08 MED ORDER — PREDNISONE 20 MG PO TABS: ORAL_TABLET | ORAL | 0 refills | Status: DC

## 2022-05-08 MED ORDER — BENZONATATE 100 MG PO CAPS: 100.0000 mg | ORAL_CAPSULE | Freq: Three times a day (TID) | ORAL | 1 refills | Status: DC | PRN

## 2022-05-08 NOTE — Telephone Encounter (Signed)
Sent prednisone and Tessalon Perles to his pharmacy ?

## 2022-05-08 NOTE — Telephone Encounter (Signed)
Please advise 

## 2022-05-08 NOTE — Telephone Encounter (Signed)
Patient aware and verbalizes understanding. 

## 2022-05-22 ENCOUNTER — Ambulatory Visit (INDEPENDENT_AMBULATORY_CARE_PROVIDER_SITE_OTHER): Payer: Medicare Other

## 2022-05-22 VITALS — Wt 223.0 lb

## 2022-05-22 DIAGNOSIS — Z0000 Encounter for general adult medical examination without abnormal findings: Secondary | ICD-10-CM

## 2022-05-22 NOTE — Patient Instructions (Signed)
[name] , Thank you for taking time to come for your Medicare Wellness Visit. I appreciate your ongoing commitment to your health goals. Please review the following plan we discussed and let me know if I can assist you in the future.   Screening recommendations/referrals: Colonoscopy: No longer required Recommended yearly ophthalmology/optometry visit for glaucoma screening and checkup Recommended yearly dental visit for hygiene and checkup  Vaccinations: Influenza vaccine: Done 12/06/2021 - Repeat annually  Pneumococcal vaccine: Done 04/20/2012 & 05/09/2015 Tdap vaccine: Done 12/06/2020 - Repeat in 10 years Shingles vaccine: Done 03/10/2022 - get second dose at next visit   Covid-19: Done 05/21/2020 & 06/18/2020  Advanced directives: Advance directive discussed with you today. Even though you declined this today, please call our office should you change your mind, and we can give you the proper paperwork for you to fill out.   Conditions/risks identified: Aim for 30 minutes of exercise or brisk walking, 6-8 glasses of water, and 5 servings of fruits and vegetables each day.   Next appointment: Follow up in one year for your annual wellness visit.   Preventive Care 80 Years and Older, Male  Preventive care refers to lifestyle choices and visits with your health care provider that can promote health and wellness. What does preventive care include? A yearly physical exam. This is also called an annual well check. Dental exams once or twice a year. Routine eye exams. Ask your health care provider how often you should have your eyes checked. Personal lifestyle choices, including: Daily care of your teeth and gums. Regular physical activity. Eating a healthy diet. Avoiding tobacco and drug use. Limiting alcohol use. Practicing safe sex. Taking low doses of aspirin every day. Taking vitamin and mineral supplements as recommended by your health care provider. What happens during an annual  well check? The services and screenings done by your health care provider during your annual well check will depend on your age, overall health, lifestyle risk factors, and family history of disease. Counseling  Your health care provider may ask you questions about your: Alcohol use. Tobacco use. Drug use. Emotional well-being. Home and relationship well-being. Sexual activity. Eating habits. History of falls. Memory and ability to understand (cognition). Work and work Statistician. Screening  You may have the following tests or measurements: Height, weight, and BMI. Blood pressure. Lipid and cholesterol levels. These may be checked every 5 years, or more frequently if you are over 73 years old. Skin check. Lung cancer screening. You may have this screening every year starting at age 75 if you have a 30-pack-year history of smoking and currently smoke or have quit within the past 15 years. Fecal occult blood test (FOBT) of the stool. You may have this test every year starting at age 26. Flexible sigmoidoscopy or colonoscopy. You may have a sigmoidoscopy every 5 years or a colonoscopy every 10 years starting at age 22. Prostate cancer screening. Recommendations will vary depending on your family history and other risks. Hepatitis C blood test. Hepatitis B blood test. Sexually transmitted disease (STD) testing. Diabetes screening. This is done by checking your blood sugar (glucose) after you have not eaten for a while (fasting). You may have this done every 1-3 years. Abdominal aortic aneurysm (AAA) screening. You may need this if you are a current or former smoker. Osteoporosis. You may be screened starting at age 8 if you are at high risk. Talk with your health care provider about your test results, treatment options, and if necessary, the  need for more tests. Vaccines  Your health care provider may recommend certain vaccines, such as: Influenza vaccine. This is recommended every  year. Tetanus, diphtheria, and acellular pertussis (Tdap, Td) vaccine. You may need a Td booster every 10 years. Zoster vaccine. You may need this after age 52. Pneumococcal 13-valent conjugate (PCV13) vaccine. One dose is recommended after age 72. Pneumococcal polysaccharide (PPSV23) vaccine. One dose is recommended after age 26. Talk to your health care provider about which screenings and vaccines you need and how often you need them. This information is not intended to replace advice given to you by your health care provider. Make sure you discuss any questions you have with your health care provider. Document Released: 01/11/2016 Document Revised: 09/03/2016 Document Reviewed: 10/16/2015 Elsevier Interactive Patient Education  2017 [place] Prevention in the Home Falls can cause injuries. They can happen to people of all ages. There are many things you can do to make your home safe and to help prevent falls. What can I do on the outside of my home? Regularly fix the edges of walkways and driveways and fix any cracks. Remove anything that might make you trip as you walk through a door, such as a raised step or threshold. Trim any bushes or trees on the path to your home. Use bright outdoor lighting. Clear any walking paths of anything that might make someone trip, such as rocks or tools. Regularly check to see if handrails are loose or broken. Make sure that both sides of any steps have handrails. Any raised decks and porches should have guardrails on the edges. Have any leaves, snow, or ice cleared regularly. Use sand or salt on walking paths during winter. Clean up any spills in your garage right away. This includes oil or grease spills. What can I do in the bathroom? Use night lights. Install grab bars by the toilet and in the tub and shower. Do not use towel bars as grab bars. Use non-skid mats or decals in the tub or shower. If you need to sit down in the shower, use a  plastic, non-slip stool. Keep the floor dry. Clean up any water that spills on the floor as soon as it happens. Remove soap buildup in the tub or shower regularly. Attach bath mats securely with double-sided non-slip rug tape. Do not have throw rugs and other things on the floor that can make you trip. What can I do in the bedroom? Use night lights. Make sure that you have a light by your bed that is easy to reach. Do not use any sheets or blankets that are too big for your bed. They should not hang down onto the floor. Have a firm chair that has side arms. You can use this for support while you get dressed. Do not have throw rugs and other things on the floor that can make you trip. What can I do in the kitchen? Clean up any spills right away. Avoid walking on wet floors. Keep items that you use a lot in easy-to-reach places. If you need to reach something above you, use a strong step stool that has a grab bar. Keep electrical cords out of the way. Do not use floor polish or wax that makes floors slippery. If you must use wax, use non-skid floor wax. Do not have throw rugs and other things on the floor that can make you trip. What can I do with my stairs? Do not leave any items on the  stairs. Make sure that there are handrails on both sides of the stairs and use them. Fix handrails that are broken or loose. Make sure that handrails are as long as the stairways. Check any carpeting to make sure that it is firmly attached to the stairs. Fix any carpet that is loose or worn. Avoid having throw rugs at the top or bottom of the stairs. If you do have throw rugs, attach them to the floor with carpet tape. Make sure that you have a light switch at the top of the stairs and the bottom of the stairs. If you do not have them, ask someone to add them for you. What else can I do to help prevent falls? Wear shoes that: Do not have high heels. Have rubber bottoms. Are comfortable and fit you  well. Are closed at the toe. Do not wear sandals. If you use a stepladder: Make sure that it is fully opened. Do not climb a closed stepladder. Make sure that both sides of the stepladder are locked into place. Ask someone to hold it for you, if possible. Clearly mark and make sure that you can see: Any grab bars or handrails. First and last steps. Where the edge of each step is. Use tools that help you move around (mobility aids) if they are needed. These include: Canes. Walkers. Scooters. Crutches. Turn on the lights when you go into a dark area. Replace any light bulbs as soon as they burn out. Set up your furniture so you have a clear path. Avoid moving your furniture around. If any of your floors are uneven, fix them. If there are any pets around you, be aware of where they are. Review your medicines with your doctor. Some medicines can make you feel dizzy. This can increase your chance of falling. Ask your doctor what other things that you can do to help prevent falls. This information is not intended to replace advice given to you by your health care provider. Make sure you discuss any questions you have with your health care provider. Document Released: 10/11/2009 Document Revised: 05/22/2016 Document Reviewed: 01/19/2015 Elsevier Interactive Patient Education  2017 [employer].

## 2022-05-22 NOTE — Progress Notes (Signed)
Subjective:   Vincent Collins is a 81 y.o. male who presents for Medicare Annual/Subsequent preventive examination.  Virtual Visit via Telephone Note  I connected with  Vincent Collins on 05/22/22 at  9:45 AM EDT by telephone and verified that I am speaking with the correct person using two identifiers.  Location: Patient: Home Provider: [facility] Persons participating in the virtual visit: patient/Nurse Health Advisor   I discussed the limitations, risks, security and privacy concerns of performing an evaluation and management service by telephone and the availability of in person appointments. The patient expressed understanding and agreed to proceed.  Interactive audio and video telecommunications were attempted between this nurse and patient, however failed, due to patient having technical difficulties OR patient did not have access to video capability.  We continued and completed visit with audio only.  Some vital signs may be absent or patient reported.   Nykia Turko E Ryatt Corsino, LPN   Review of Systems     Cardiac Risk Factors include: advanced age (>2155men, 49>65 women);diabetes mellitus;dyslipidemia;hypertension;male gender;obesity (BMI >30kg/m2);Other (see comment), Risk factor comments: COPD, A.Fib     Objective:    Today's Vitals   05/22/22 0947  Weight: 223 lb (101.2 kg)   Body mass index is 33.91 kg/m.     05/22/2022    9:55 AM 05/21/2021    9:00 AM 08/03/2020    8:13 AM 07/31/2020    3:21 AM 04/16/2020    8:51 AM 03/26/2020   10:21 AM 03/24/2020    6:25 PM  Advanced Directives  Does Patient Have a Medical Advance Directive? No No No No No No No  Would patient like information on creating a medical advance directive? No - Patient declined Yes (MAU/Ambulatory/Procedural Areas - Information given) No - Patient declined No - Patient declined No - Patient declined No - Patient declined     Current Medications (verified) Outpatient Encounter Medications as of 05/22/2022  Medication Sig    acetaminophen (TYLENOL) 325 MG tablet Take 2 tablets (650 mg total) by mouth every 6 (six) hours as needed for mild pain (or Fever >/= 101).   albuterol (PROVENTIL) (2.5 MG/3ML) 0.083% nebulizer solution Take 3 mLs (2.5 mg total) by nebulization every 6 (six) hours as needed for wheezing or shortness of breath. USE 1 VIAL VIA NEBULIZER 4&amp;nbsp;&amp;nbsp;TIMES DAILY AS NEEDED FOR&amp;nbsp;&amp;nbsp;WHEEZING OR SHORTNESS OF&amp;nbsp;&amp;nbsp;BREATH   apixaban (ELIQUIS) 5 MG TABS tablet Take 1 tablet by mouth twice daily   atorvastatin (LIPITOR) 40 MG tablet Take 1 tablet (40 mg total) by mouth daily.   Cholecalciferol (VITAMIN D) 1000 UNITS capsule Take 1,000 Units by mouth in the morning.   Coenzyme Q10-Fish Oil-Vit E (CO-Q 10 OMEGA-3 FISH OIL) CAPS Take 1 capsule by mouth in the morning. 100mg  Capsule Qunol   fluticasone (FLONASE) 50 MCG/ACT nasal spray 1 spray per nostril daily   lisinopril (ZESTRIL) 40 MG tablet Take 1 tablet (40 mg total) by mouth daily.   meclizine (ANTIVERT) 25 MG tablet Take 1 tablet (25 mg total) by mouth 3 (three) times daily as needed for dizziness.   metFORMIN (GLUCOPHAGE-XR) 500 MG 24 hr tablet Take 1 tablet (500 mg total) by mouth 2 (two) times daily.   metoprolol succinate (TOPROL-XL) 50 MG 24 hr tablet Take 1 tablet (50 mg total) by mouth daily. Take with or immediately following a meal.   omeprazole (PRILOSEC) 20 MG capsule Take 1 capsule (20 mg total) by mouth daily.   tamsulosin (FLOMAX) 0.4 MG CAPS capsule Take 2 capsules  daily after supper   [DISCONTINUED] benzonatate (TESSALON PERLES) 100 MG capsule Take 1 capsule (100 mg total) by mouth 3 (three) times daily as needed for cough. (Patient not taking: Reported on 05/22/2022)   [DISCONTINUED] predniSONE (DELTASONE) 20 MG tablet 2 po at same time daily for 5 days (Patient not taking: Reported on 05/22/2022)   No facility-administered encounter medications on file as of 05/22/2022.    Allergies  (verified) Patient has no known allergies.   History: Past Medical History:  Diagnosis Date   Arrhythmia    Broken leg and ribs, right, closed, initial encounter    COPD (chronic obstructive pulmonary disease) (HCC)    Diabetes mellitus without complication (HCC)    Diverticulitis    GERD (gastroesophageal reflux disease)    Hydronephrosis of left kidney    Hyperlipidemia    Hypertension    Left ureteral stone    obstructing   Renal calculus or stone 2015   Past Surgical History:  Procedure Laterality Date   2d echo  04/04/09   CHOLECYSTECTOMY N/A 08/03/2020   Procedure: LAPAROSCOPIC CHOLECYSTECTOMY WITH INTRAOPERATIVE CHOLANGIOGRAM;  Surgeon: Gaynelle Adu, MD;  Location: [facility] OR;  Service: General;  Laterality: N/A;   CYSTOSCOPY WITH RETROGRADE PYELOGRAM, URETEROSCOPY AND STENT PLACEMENT Left 10/11/2014   Procedure: CYSTOSCOPY WITH RETROGRADE PYELOGRAM, DIAGNOSTIC URETEROSCOPY AND STENT PLACEMENT;  Surgeon: [name], MD;  Location: [facility];  Service: Urology;  Laterality: Left;   LUMBAR DISC SURGERY     SPINE SURGERY     Family History  Problem Relation Age of Onset   Early death Father        MI age 13   Heart attack Father    COPD Brother    Cancer Sister    Social History   Socioeconomic History   Marital status: Married    Spouse name: Harriett Sine   Number of children: 2   Years of education: 8th   Highest education level: 8th grade  Occupational History   Occupation: [employer]    Comment: part time 6 hour shifts  Tobacco Use   Smoking status: Former    Types: Cigarettes    Quit date: 06/18/1967    Years since quitting: 54.9   Smokeless tobacco: Former    Types: Associate Professor Use: Never used  Substance and Sexual Activity   Alcohol use: No   Drug use: No   Sexual activity: Not Currently  Other Topics Concern   Not on file  Social History Narrative   Lives home with wife - still working part time   Social  Determinants of Corporate investment banker Strain: Low Risk    Difficulty of Paying Living Expenses: Not hard at all  Food Insecurity: No Food Insecurity   Worried About Programme researcher, broadcasting/film/video in the Last Year: Never true   Barista in the Last Year: Never true  Transportation Needs: No Transportation Needs   Lack of Transportation (Medical): No   Lack of Transportation (Non-Medical): No  Physical Activity: Sufficiently Active   Days of Exercise per Week: 4 days   Minutes of Exercise per Session: 40 min  Stress: No Stress Concern Present   Feeling of Stress : Not at all  Social Connections: Socially Integrated   Frequency of Communication with Friends and Family: More than three times a week   Frequency of Social Gatherings with Friends and Family: More than three times a week  Attends Religious Services: 1 to 4 times per year   Active Member of Clubs or Organizations: Yes   Attends Banker Meetings: 1 to 4 times per year   Marital Status: Married    Tobacco Counseling Counseling given: Not Answered   Clinical Intake:  Pre-visit preparation completed: Yes  Pain : No/denies pain     BMI - recorded: 33.91 Nutritional Status: BMI > 30  Obese Nutritional Risks: None Diabetes: Yes CBG done?: No Did pt. bring in CBG monitor from home?: No  How often do you need to have someone help you when you read instructions, pamphlets, or other written materials from your doctor or pharmacy?: 1 - Never  Diabetic? Nutrition Risk Assessment:  Has the patient had any N/V/D within the last 2 months?  No  Does the patient have any non-healing wounds?  No  Has the patient had any unintentional weight loss or weight gain?  No   Diabetes:  Is the patient diabetic?  Yes  If diabetic, was a CBG obtained today?  No  Did the patient bring in their glucometer from home?  No  How often do you monitor your CBG's? Used to check daily; hasn't been checking lately - needs new  glucometer.   Financial Strains and Diabetes Management:  Are you having any financial strains with the device, your supplies or your medication? No .  Does the patient want to be seen by Chronic Care Management for management of their diabetes?  No  Would the patient like to be referred to a Nutritionist or for Diabetic Management?  No   Diabetic Exams:  Diabetic Eye Exam: Completed 02/25/2022.   Diabetic Foot Exam: Completed 08/28/2021. Pt has been advised about the importance in completing this exam. Pt is scheduled for diabetic foot exam on next appt with Dettinger.    Interpreter Needed?: No  Information entered by :: Vail Basista, LPN   Activities of Daily Living    05/22/2022    9:55 AM  In your present state of health, do you have any difficulty performing the following activities:  Hearing? 1  Comment mild  Vision? 0  Difficulty concentrating or making decisions? 0  Walking or climbing stairs? 1  Comment can only tolerate a few at a time - hurts knees, gets out of breath  Dressing or bathing? 0  Doing errands, shopping? 0  Preparing Food and eating ? N  Using the Toilet? N  In the past six months, have you accidently leaked urine? N  Do you have problems with loss of bowel control? N  Managing your Medications? N  Managing your Finances? N  Housekeeping or managing your Housekeeping? N    Patient Care Team: Dettinger, Elige Radon, MD as PCP - General (Family Medicine) O'Neal, Ronnald Ramp, MD as PCP - Cardiology (Cardiology) O'Neal, Ronnald Ramp, MD as Consulting Physician (Cardiology) Michaelle Copas, MD as Referring Physician (Optometry)  Indicate any recent Medical Services you may have received from other than [facility] providers in the past year (date may be approximate).     Assessment:   This is a routine wellness examination for Vincent Collins.  Hearing/Vision screen Hearing Screening - Comments:: C/o mild hearing difficulties - declines hearing aids Vision  Screening - Comments:: Wears rx glasses - up to date with routine eye exams with [facility] in [place]  Dietary issues and exercise activities discussed: Current Exercise Habits: The patient has a physically strenuous job, but has no regular exercise apart  from work., Type of exercise: walking;Other - see comments (stacks boxes, lifts, bends, etc at work), Time (Minutes): 40, Frequency (Times/Week): 4, Weekly Exercise (Minutes/Week): 160, Intensity: Mild, Exercise limited by: orthopedic condition(s);respiratory conditions(s);cardiac condition(s)   Goals Addressed             This Visit's Progress    DIET - EAT MORE FRUITS AND VEGETABLES   On track    Exercise 150 minutes per week (moderate activity)   On track    Try to walk for at least 30 minutes daily. Avoid the heat of the day.       Reduce portion size   On track      Depression Screen    05/22/2022    9:54 AM 05/07/2022    9:20 AM 03/10/2022    8:11 AM 12/06/2021   11:02 AM 08/28/2021    8:04 AM 05/23/2021    9:03 AM 05/23/2021    9:02 AM  PHQ 2/9 Scores  PHQ - 2 Score 0 0 0 0 0 0 0  PHQ- 9 Score      0     Fall Risk    05/22/2022    9:49 AM 05/07/2022    9:20 AM 03/10/2022    8:11 AM 12/06/2021   11:02 AM 08/28/2021    8:04 AM  Fall Risk   Falls in the past year? 0 0 0 0 0  Number falls in past yr: 0      Injury with Fall? 0      Risk for fall due to : Orthopedic patient      Follow up Falls prevention discussed        FALL RISK PREVENTION PERTAINING TO THE HOME:  Any stairs in or around the home? No  If so, are there any without handrails? No  Home free of loose throw rugs in walkways, pet beds, electrical cords, etc? Yes  Adequate lighting in your home to reduce risk of falls? Yes   ASSISTIVE DEVICES UTILIZED TO PREVENT FALLS:  Life alert? No  Use of a cane, walker or w/c? No  Grab bars in the bathroom? Yes  Shower chair or bench in shower? Yes  Elevated toilet seat or a handicapped toilet? Yes   TIMED UP  AND GO:  Was the test performed? No . Telephonic visit  Cognitive Function:    06/17/2017   10:21 AM  MMSE - Mini Mental State Exam  Not completed: Unable to complete  Orientation to time 5  Orientation to Place 4  Registration 3  Attention/ Calculation 0  Attention/Calculation-comments unable to spell  Recall 3  Language- name 2 objects 2  Language- repeat 1  Language- follow 3 step command 3  Language- read & follow direction 0  Language-read & follow direction-comments difficulty with reading and spelling  Write a sentence 0  Write a sentence-comments difficulty with reading and spelling  Copy design 1  Total score 22        05/22/2022   10:04 AM 04/16/2020    8:44 AM  6CIT Screen  What Year? 0 points 0 points  What month? 0 points 0 points  What time? 0 points 0 points  Count back from 20 0 points 0 points  Months in reverse 4 points 0 points  Repeat phrase 6 points 4 points  Total Score 10 points 4 points    Immunizations Immunization History  Administered Date(s) Administered   DTaP 11/23/2007   Fluad Quad(high Dose 65+)  01/04/2020, 12/06/2020, 12/06/2021   Influenza Whole 09/26/2010   Influenza, High Dose Seasonal PF 10/07/2017, 01/04/2019   Influenza,inj,Quad PF,6+ Mos 10/06/2013, 10/02/2014, 10/19/2015, 11/26/2016   Moderna Sars-Covid-2 Vaccination 05/21/2020, 06/18/2020   Pneumococcal Conjugate-13 05/09/2015   Pneumococcal Polysaccharide-23 04/05/2012, 04/20/2012   Tdap 05/17/2009, 12/06/2020   Zoster Recombinat (Shingrix) 03/10/2022   Zoster, Live 05/17/2014    TDAP status: Up to date  Flu Vaccine status: Up to date  Pneumococcal vaccine status: Up to date  Covid-19 vaccine status: Completed vaccines  Qualifies for Shingles Vaccine? Yes   Zostavax completed Yes   Shingrix Completed?: No.    Education has been provided regarding the importance of this vaccine. Patient has been advised to call insurance company to determine out of pocket  expense if they have not yet received this vaccine. Advised may also receive vaccine at local pharmacy or Health Dept. Verbalized acceptance and understanding.  Screening Tests Health Maintenance  Topic Date Due   Zoster Vaccines- Shingrix (2 of 2) 05/05/2022   COVID-19 Vaccine (3 - Moderna risk series) 09/01/2022 (Originally 07/16/2020)   INFLUENZA VACCINE  07/29/2022   FOOT EXAM  08/28/2022   HEMOGLOBIN A1C  09/10/2022   OPHTHALMOLOGY EXAM  02/25/2023   TETANUS/TDAP  12/06/2030   Pneumonia Vaccine 57+ Years old  Completed   Hepatitis C Screening  Completed   HPV VACCINES  Aged Out    Health Maintenance  Health Maintenance Due  Topic Date Due   Zoster Vaccines- Shingrix (2 of 2) 05/05/2022    Colorectal cancer screening: No longer required.   Lung Cancer Screening: (Low Dose CT Chest recommended if Age 67-80 years, 30 pack-year currently smoking OR have quit w/in 15years.) does not qualify.   Additional Screening:  Hepatitis C Screening: does not qualify  Vision Screening: Recommended annual ophthalmology exams for early detection of glaucoma and other disorders of the eye. Is the patient up to date with their annual eye exam?  Yes  Who is the provider or what is the name of the office in which the patient attends annual eye exams? [facility] in [place] If pt is not established with a provider, would they like to be referred to a provider to establish care? No .   Dental Screening: Recommended annual dental exams for proper oral hygiene  Community Resource Referral / Chronic Care Management: CRR required this visit?  No   CCM required this visit?  No      Plan:     I have personally reviewed and noted the following in the patient's chart:   Medical and social history Use of alcohol, tobacco or illicit drugs  Current medications and supplements including opioid prescriptions. Patient is not currently taking opioid prescriptions. Functional ability and  status Nutritional status Physical activity Advanced directives List of other physicians Hospitalizations, surgeries, and ER visits in previous 12 months Vitals Screenings to include cognitive, depression, and falls Referrals and appointments  In addition, I have reviewed and discussed with patient certain preventive protocols, quality metrics, and best practice recommendations. A written personalized care plan for preventive services as well as general preventive health recommendations were provided to patient.     [place] Constable, LPN   1/61/0960   Nurse Notes: None

## 2022-06-03 DIAGNOSIS — G44219 Episodic tension-type headache, not intractable: Secondary | ICD-10-CM | POA: Diagnosis not present

## 2022-06-05 ENCOUNTER — Telehealth: Payer: Self-pay | Admitting: Pharmacist

## 2022-06-05 ENCOUNTER — Ambulatory Visit (INDEPENDENT_AMBULATORY_CARE_PROVIDER_SITE_OTHER): Payer: Medicare Other | Admitting: Family Medicine

## 2022-06-05 ENCOUNTER — Other Ambulatory Visit: Payer: Self-pay | Admitting: Family Medicine

## 2022-06-05 ENCOUNTER — Encounter: Payer: Self-pay | Admitting: Family Medicine

## 2022-06-05 VITALS — BP 152/75 | HR 59 | Temp 98.2°F | Ht 68.0 in | Wt 227.0 lb

## 2022-06-05 DIAGNOSIS — Z23 Encounter for immunization: Secondary | ICD-10-CM

## 2022-06-05 DIAGNOSIS — R351 Nocturia: Secondary | ICD-10-CM

## 2022-06-05 DIAGNOSIS — E785 Hyperlipidemia, unspecified: Secondary | ICD-10-CM

## 2022-06-05 DIAGNOSIS — I4819 Other persistent atrial fibrillation: Secondary | ICD-10-CM

## 2022-06-05 DIAGNOSIS — E1169 Type 2 diabetes mellitus with other specified complication: Secondary | ICD-10-CM | POA: Diagnosis not present

## 2022-06-05 DIAGNOSIS — I10 Essential (primary) hypertension: Secondary | ICD-10-CM

## 2022-06-05 DIAGNOSIS — N401 Enlarged prostate with lower urinary tract symptoms: Secondary | ICD-10-CM | POA: Diagnosis not present

## 2022-06-05 LAB — BAYER DCA HB A1C WAIVED: HB A1C (BAYER DCA - WAIVED): 6.3 % — ABNORMAL HIGH (ref 4.8–5.6)

## 2022-06-05 MED ORDER — CONTOUR NEXT TEST VI STRP: ORAL_STRIP | 12 refills | Status: DC

## 2022-06-05 MED ORDER — ACCU-CHEK GUIDE W/DEVICE KIT: PACK | 0 refills | Status: AC

## 2022-06-05 MED ORDER — MICROLET LANCETS MISC: 11 refills | Status: AC

## 2022-06-05 NOTE — Telephone Encounter (Signed)
CONTOUR NEXT GLUCOMETER GIVEN TO PATIENT  CALLED IN LANCETS & TEST STRIPS DX E 11.9

## 2022-06-05 NOTE — Progress Notes (Signed)
BP (!) 152/75   Pulse (!) 59   Temp 98.2 F (36.8 C)   Ht 5\' 8"  (1.727 m)   Wt 227 lb (103 kg)   SpO2 97%   BMI 34.52 kg/m    Subjective:   Patient ID: Vincent Collins, male    DOB: 1941/02/08, 81 y.o.   MRN: [id number]  HPI: TYRONNE BLANN is a 81 y.o. male presenting on 06/05/2022 for Medical Management of Chronic Issues, Hyperlipidemia, Hypertension, and Diabetes   HPI Type 2 diabetes mellitus Patient comes in today for recheck of his diabetes. Patient has been currently taking metformin, A1c looks good at 6.3. Patient is currently on an ACE inhibitor/ARB. Patient has seen an ophthalmologist this year. Patient denies any issues with their feet. The symptom started onset as an adult hypertension and hyperlipidemia ARE RELATED TO DM   Hypertension Patient is currently on lisinopril and metoprolol, and their blood pressure today is 152/72 on recheck, he says it runs good at home.. Patient denies any lightheadedness or dizziness. Patient denies headaches, blurred vision, chest pains, shortness of breath, or weakness. Denies any side effects from medication and is content with current medication.   Hyperlipidemia Patient is coming in for recheck of his hyperlipidemia. The patient is currently taking Lipitor. They deny any issues with myalgias or history of liver damage from it. They deny any focal numbness or weakness or chest pain.   Relevant past medical, surgical, family and social history reviewed and updated as indicated. Interim medical history since our last visit reviewed. Allergies and medications reviewed and updated.  Review of Systems  Constitutional:  Negative for chills and fever.  Eyes:  Negative for visual disturbance.  Respiratory:  Negative for shortness of breath and wheezing.   Cardiovascular:  Negative for chest pain and leg swelling.  Musculoskeletal:  Negative for back pain and gait problem.  Skin:  Negative for rash.  Neurological:  Negative for dizziness,  weakness and light-headedness.  All other systems reviewed and are negative.   Per HPI unless specifically indicated above   Allergies as of 06/05/2022   No Known Allergies      Medication List        Accurate as of June 05, 2022  9:38 AM. If you have any questions, ask your nurse or doctor.          acetaminophen 325 MG tablet Commonly known as: TYLENOL Take 2 tablets (650 mg total) by mouth every 6 (six) hours as needed for mild pain (or Fever >/= 101).   albuterol (2.5 MG/3ML) 0.083% nebulizer solution Commonly known as: PROVENTIL Take 3 mLs (2.5 mg total) by nebulization every 6 (six) hours as needed for wheezing or shortness of breath. USE 1 VIAL VIA NEBULIZER 4&amp;nbsp;&amp;nbsp;TIMES DAILY AS NEEDED FOR&amp;nbsp;&amp;nbsp;WHEEZING OR SHORTNESS OF&amp;nbsp;&amp;nbsp;BREATH   atorvastatin 40 MG tablet Commonly known as: LIPITOR Take 1 tablet (40 mg total) by mouth daily.   CO-Q 10 Omega-3 Fish Oil Caps Take 1 capsule by mouth in the morning. 100mg  Capsule Qunol   Eliquis 5 MG Tabs tablet Generic drug: apixaban Take 1 tablet by mouth twice daily   fluticasone 50 MCG/ACT nasal spray Commonly known as: FLONASE 1 spray per nostril daily   lisinopril 40 MG tablet Commonly known as: ZESTRIL Take 1 tablet (40 mg total) by mouth daily.   meclizine 25 MG tablet Commonly known as: ANTIVERT Take 1 tablet (25 mg total) by mouth 3 (three) times daily as needed for dizziness.  metFORMIN 500 MG 24 hr tablet Commonly known as: GLUCOPHAGE-XR Take 1 tablet (500 mg total) by mouth 2 (two) times daily.   metoprolol succinate 50 MG 24 hr tablet Commonly known as: TOPROL-XL Take 1 tablet (50 mg total) by mouth daily. Take with or immediately following a meal.   omeprazole 20 MG capsule Commonly known as: PRILOSEC Take 1 capsule (20 mg total) by mouth daily.   tamsulosin 0.4 MG Caps capsule Commonly known as: FLOMAX Take 2 capsules daily after supper   Vitamin D 1000  units capsule Take 1,000 Units by mouth in the morning.         Objective:   BP (!) 152/75   Pulse (!) 59   Temp 98.2 F (36.8 C)   Ht $R'5\' 8"'to$  (1.727 m)   Wt 227 lb (103 kg)   SpO2 97%   BMI 34.52 kg/m   Wt Readings from Last 3 Encounters:  06/05/22 227 lb (103 kg)  05/22/22 223 lb (101.2 kg)  05/07/22 229 lb (103.9 kg)    Physical Exam Vitals and nursing note reviewed.  Constitutional:      General: He is not in acute distress.    Appearance: He is well-developed. He is not diaphoretic.  Eyes:     General: No scleral icterus.    Conjunctiva/sclera: Conjunctivae normal.  Neck:     Thyroid: No thyromegaly.  Cardiovascular:     Rate and Rhythm: Normal rate and regular rhythm.     Heart sounds: Normal heart sounds. No murmur heard. Pulmonary:     Effort: Pulmonary effort is normal. No respiratory distress.     Breath sounds: Normal breath sounds. No wheezing.  Musculoskeletal:        General: No swelling. Normal range of motion.     Cervical back: Neck supple.  Lymphadenopathy:     Cervical: No cervical adenopathy.  Skin:    General: Skin is warm and dry.     Findings: No rash.  Neurological:     Mental Status: He is alert and oriented to person, place, and time.     Coordination: Coordination normal.  Psychiatric:        Behavior: Behavior normal.     Results for orders placed or performed in visit on 03/27/22  HM DIABETES EYE EXAM  Result Value Ref Range   HM Diabetic Eye Exam No Retinopathy No Retinopathy    Assessment & Plan:   Problem List Items Addressed This Visit       Cardiovascular and Mediastinum   Essential hypertension   Relevant Orders   CBC with Differential/Platelet   CMP14+EGFR   Lipid panel   Bayer DCA Hb A1c Waived   Persistent atrial fibrillation ([place])     Endocrine   Hyperlipidemia associated with type 2 diabetes mellitus ([place])   Relevant Orders   CBC with Differential/Platelet   CMP14+EGFR   Lipid panel   Bayer DCA Hb  A1c Waived   Type 2 diabetes mellitus ([place]) - Primary   Relevant Orders   CBC with Differential/Platelet   CMP14+EGFR   Lipid panel   Bayer DCA Hb A1c Waived     Genitourinary   BPH (benign prostatic hyperplasia)   Relevant Orders   PSA, total and free   Other Visit Diagnoses     Need for shingles vaccine       Relevant Orders   Varicella-zoster vaccine IM (Shingrix) (Completed)       A1c looks good at 6.3, blood pressure  elevated today but he keeps close eye on it at home. Follow up plan: Return in about 3 months (around 09/05/2022), or if symptoms worsen or fail to improve, for Hypertension and hyperlipidemia and diabetes.  Counseling provided for all of the vaccine components Orders Placed This Encounter  Procedures   Varicella-zoster vaccine IM (Shingrix)   CBC with Differential/Platelet   CMP14+EGFR   Lipid panel   Bayer DCA Hb A1c Waived   PSA, total and free    Caryl Pina, MD [facility] 06/05/2022, 9:38 AM

## 2022-06-06 LAB — LIPID PANEL
Chol/HDL Ratio: 2.4 ratio (ref 0.0–5.0)
Cholesterol, Total: 104 mg/dL (ref 100–199)
HDL: 44 mg/dL (ref >39)
LDL Chol Calc (NIH): 45 mg/dL (ref 0–99)
Triglycerides: 72 mg/dL (ref 0–149)
VLDL Cholesterol Cal: 15 mg/dL (ref 5–40)

## 2022-06-06 LAB — CMP14+EGFR
ALT: 12 IU/L (ref 0–44)
AST: 13 IU/L (ref 0–40)
Albumin/Globulin Ratio: 2.1 (ref 1.2–2.2)
Albumin: 3.9 g/dL (ref 3.7–4.7)
Alkaline Phosphatase: 83 IU/L (ref 44–121)
BUN/Creatinine Ratio: 16 (ref 10–24)
BUN: 14 mg/dL (ref 8–27)
Bilirubin Total: 1.2 mg/dL (ref 0.0–1.2)
CO2: 25 mmol/L (ref 20–29)
Calcium: 9 mg/dL (ref 8.6–10.2)
Chloride: 105 mmol/L (ref 96–106)
Creatinine, Ser: 0.85 mg/dL (ref 0.76–1.27)
Globulin, Total: 1.9 g/dL (ref 1.5–4.5)
Glucose: 133 mg/dL — ABNORMAL HIGH (ref 70–99)
Potassium: 4.4 mmol/L (ref 3.5–5.2)
Sodium: 144 mmol/L (ref 134–144)
Total Protein: 5.8 g/dL — ABNORMAL LOW (ref 6.0–8.5)
eGFR: 88 mL/min/{1.73_m2} (ref >59)

## 2022-06-06 LAB — CBC WITH DIFFERENTIAL/PLATELET
Basophils Absolute: 0.1 10*3/uL (ref 0.0–0.2)
Basos: 1 %
EOS (ABSOLUTE): 0.2 10*3/uL (ref 0.0–0.4)
Eos: 3 %
Hematocrit: 46 % (ref 37.5–51.0)
Hemoglobin: 15.8 g/dL (ref 13.0–17.7)
Immature Grans (Abs): 0 10*3/uL (ref 0.0–0.1)
Immature Granulocytes: 0 %
Lymphocytes Absolute: 1.6 10*3/uL (ref 0.7–3.1)
Lymphs: 22 %
MCH: 30.4 pg (ref 26.6–33.0)
MCHC: 34.3 g/dL (ref 31.5–35.7)
MCV: 89 fL (ref 79–97)
Monocytes Absolute: 0.6 10*3/uL (ref 0.1–0.9)
Monocytes: 8 %
Neutrophils Absolute: 4.6 10*3/uL (ref 1.4–7.0)
Neutrophils: 66 %
Platelets: 135 10*3/uL — ABNORMAL LOW (ref 150–450)
RBC: 5.19 x10E6/uL (ref 4.14–5.80)
RDW: 13.4 % (ref 11.6–15.4)
WBC: 7 10*3/uL (ref 3.4–10.8)

## 2022-06-06 LAB — PSA, TOTAL AND FREE
PSA, Free Pct: 18.7 %
PSA, Free: 0.43 ng/mL
Prostate Specific Ag, Serum: 2.3 ng/mL (ref 0.0–4.0)

## 2022-07-06 ENCOUNTER — Other Ambulatory Visit: Payer: Self-pay | Admitting: Family Medicine

## 2022-07-06 DIAGNOSIS — J449 Chronic obstructive pulmonary disease, unspecified: Secondary | ICD-10-CM

## 2022-07-08 ENCOUNTER — Other Ambulatory Visit: Payer: Self-pay | Admitting: Cardiovascular Disease

## 2022-07-08 NOTE — Telephone Encounter (Signed)
Pt last saw Dr Flora Lipps 02/07/22, last labs 06/05/22 Creat 0.85, age 81, weight 103kg, based on specified criteria pt is on appropriate dosage of Eliquis 5mg  BID for afib.  Will refill rx.

## 2022-09-08 ENCOUNTER — Encounter: Payer: Self-pay | Admitting: Family Medicine

## 2022-09-08 ENCOUNTER — Other Ambulatory Visit: Payer: Self-pay

## 2022-09-08 ENCOUNTER — Ambulatory Visit (INDEPENDENT_AMBULATORY_CARE_PROVIDER_SITE_OTHER): Payer: Medicare Other | Admitting: Family Medicine

## 2022-09-08 VITALS — BP 142/62 | HR 45 | Temp 98.0°F | Ht 68.0 in | Wt 233.0 lb

## 2022-09-08 DIAGNOSIS — E1169 Type 2 diabetes mellitus with other specified complication: Secondary | ICD-10-CM | POA: Diagnosis not present

## 2022-09-08 DIAGNOSIS — I10 Essential (primary) hypertension: Secondary | ICD-10-CM

## 2022-09-08 DIAGNOSIS — I4819 Other persistent atrial fibrillation: Secondary | ICD-10-CM

## 2022-09-08 DIAGNOSIS — E785 Hyperlipidemia, unspecified: Secondary | ICD-10-CM | POA: Diagnosis not present

## 2022-09-08 DIAGNOSIS — J449 Chronic obstructive pulmonary disease, unspecified: Secondary | ICD-10-CM | POA: Diagnosis not present

## 2022-09-08 LAB — BAYER DCA HB A1C WAIVED: HB A1C (BAYER DCA - WAIVED): 6.1 % — ABNORMAL HIGH (ref 4.8–5.6)

## 2022-09-08 NOTE — Progress Notes (Signed)
BP (!) 142/62   Pulse (!) 45   Temp 98 F (36.7 C)   Ht 5' 8" (1.727 m)   Wt 233 lb (105.7 kg)   SpO2 95%   BMI 35.43 kg/m    Subjective:   Patient ID: Vincent Collins, male    DOB: [birth year], 81 y.o.   MRN: [id number]  HPI: Vincent Collins is a 81 y.o. male presenting on 09/08/2022 for Medical Management of Chronic Issues and Diabetes   HPI Persistent A-fib recheck Patient has persistent A-fib, sees cardiology for this.  He is on Eliquis and metoprolol  Hypertension Patient is currently on lisinopril and metoprolol, and their blood pressure today is 142/62. Patient denies any lightheadedness or dizziness. Patient denies headaches, blurred vision, chest pains, shortness of breath, or weakness. Denies any side effects from medication and is content with current medication.   Hyperlipidemia Patient is coming in for recheck of his hyperlipidemia. The patient is currently taking atorvastatin. They deny any issues with myalgias or history of liver damage from it. They deny any focal numbness or weakness or chest pain.   Type 2 diabetes mellitus Patient comes in today for recheck of his diabetes. Patient has been currently taking metformin. Patient is currently on an ACE inhibitor/ARB. Patient has seen an ophthalmologist this year. Patient denies any issues with their feet. The symptom started onset as an adult hypertension and hyperlipidemia and A-fib ARE RELATED TO DM   COPD Patient is coming in for COPD recheck today.  He is currently on albuterol and Flonase.  He has a mild chronic cough but denies any major coughing spells or wheezing spells.  He has 0 nighttime symptoms per week and 0 daytime symptoms per week currently.  He feels like he is doing pretty well and the heat and the weather has not bothered him at all  Relevant past medical, surgical, family and social history reviewed and updated as indicated. Interim medical history since our last visit reviewed. Allergies and  medications reviewed and updated.  Review of Systems  Constitutional:  Negative for chills and fever.  Eyes:  Negative for visual disturbance.  Respiratory:  Negative for shortness of breath and wheezing.   Cardiovascular:  Negative for chest pain and leg swelling.  Musculoskeletal:  Negative for back pain and gait problem.  Skin:  Negative for rash.  Neurological:  Negative for dizziness, weakness and light-headedness.  All other systems reviewed and are negative.   Per HPI unless specifically indicated above   Allergies as of 09/08/2022   No Known Allergies      Medication List        Accurate as of September 08, 2022 11:36 AM. If you have any questions, ask your nurse or doctor.          Accu-Chek Guide test strip Generic drug: glucose blood TEST BLOOD SUGAR DAILY; DX: E11.9.   Accu-Chek Guide w/Device Kit TEST BLOOD SUGAR DAILY; DX: E11.9.   acetaminophen 325 MG tablet Commonly known as: TYLENOL Take 2 tablets (650 mg total) by mouth every 6 (six) hours as needed for mild pain (or Fever >/= 101).   albuterol (2.5 MG/3ML) 0.083% nebulizer solution Commonly known as: PROVENTIL USE 1 VIAL VIA NEBULIZER EVERY 6 HOURS AS NEEDED FOR WHEEZING OR  SHORTNESS OF BREATH   atorvastatin 40 MG tablet Commonly known as: LIPITOR Take 1 tablet (40 mg total) by mouth daily.   CO-Q 10 Omega-3 Fish Oil Caps Take 1 capsule by mouth  in the morning. 190m Capsule Qunol   Eliquis 5 MG Tabs tablet Generic drug: apixaban Take 1 tablet by mouth twice daily   fluticasone 50 MCG/ACT nasal spray Commonly known as: FLONASE 1 spray per nostril daily   lisinopril 40 MG tablet Commonly known as: ZESTRIL Take 1 tablet (40 mg total) by mouth daily.   meclizine 25 MG tablet Commonly known as: ANTIVERT Take 1 tablet (25 mg total) by mouth 3 (three) times daily as needed for dizziness.   metFORMIN 500 MG 24 hr tablet Commonly known as: GLUCOPHAGE-XR Take 1 tablet (500 mg total) by  mouth 2 (two) times daily.   metoprolol succinate 50 MG 24 hr tablet Commonly known as: TOPROL-XL Take 1 tablet (50 mg total) by mouth daily. Take with or immediately following a meal.   Microlet Lancets Misc USE TO TEST BLOOD SUGAR DAILY; DX: E11.9. PATIENT HAS GLUCOMETER AND LANCING DEVICE   omeprazole 20 MG capsule Commonly known as: PRILOSEC Take 1 capsule (20 mg total) by mouth daily.   tamsulosin 0.4 MG Caps capsule Commonly known as: FLOMAX Take 2 capsules daily after supper   Vitamin D 1000 units capsule Take 1,000 Units by mouth in the morning.         Objective:   BP (!) 142/62   Pulse (!) 45   Temp 98 F (36.7 C)   Ht 5' 8" (1.727 m)   Wt 233 lb (105.7 kg)   SpO2 95%   BMI 35.43 kg/m   Wt Readings from Last 3 Encounters:  09/08/22 233 lb (105.7 kg)  06/05/22 227 lb (103 kg)  05/22/22 223 lb (101.2 kg)    Physical Exam Vitals and nursing note reviewed.  Constitutional:      General: He is not in acute distress.    Appearance: He is well-developed. He is not diaphoretic.  Eyes:     General: No scleral icterus.    Conjunctiva/sclera: Conjunctivae normal.  Neck:     Thyroid: No thyromegaly.  Cardiovascular:     Rate and Rhythm: Normal rate and regular rhythm.     Heart sounds: Normal heart sounds. No murmur heard. Pulmonary:     Effort: Pulmonary effort is normal. No respiratory distress.     Breath sounds: Normal breath sounds. No wheezing.  Musculoskeletal:        General: No swelling. Normal range of motion.     Cervical back: Neck supple.  Lymphadenopathy:     Cervical: No cervical adenopathy.  Skin:    General: Skin is warm and dry.     Findings: No rash.  Neurological:     Mental Status: He is alert and oriented to person, place, and time.     Coordination: Coordination normal.  Psychiatric:        Behavior: Behavior normal.     Results for orders placed or performed in visit on 09/08/22  Bayer DCA Hb A1c Waived  Result Value Ref  Range   HB A1C (BAYER DCA - WAIVED) 6.1 (H) 4.8 - 5.6 %    Assessment & Plan:   Problem List Items Addressed This Visit       Cardiovascular and Mediastinum   Essential hypertension   Persistent atrial fibrillation (HCC)     Respiratory   COPD (chronic obstructive pulmonary disease) (HCC)     Endocrine   Hyperlipidemia associated with type 2 diabetes mellitus ([facility] - Primary   Type 2 diabetes mellitus (HCC)    A1c looks good at 6.1.  Blood pressure looks okay.  No changes.  Monitor closely and keep active. Follow up plan: Return in about 3 months (around 12/08/2022), or if symptoms worsen or fail to improve, for Diabetes recheck.  Counseling provided for all of the vaccine components No orders of the defined types were placed in this encounter.   Caryl Pina, MD [facility] 09/08/2022, 11:36 AM

## 2022-09-09 ENCOUNTER — Other Ambulatory Visit: Payer: Self-pay | Admitting: Cardiovascular Disease

## 2022-09-09 DIAGNOSIS — I152 Hypertension secondary to endocrine disorders: Secondary | ICD-10-CM

## 2022-11-18 ENCOUNTER — Ambulatory Visit (INDEPENDENT_AMBULATORY_CARE_PROVIDER_SITE_OTHER): Payer: Medicare Other

## 2022-11-18 ENCOUNTER — Ambulatory Visit (INDEPENDENT_AMBULATORY_CARE_PROVIDER_SITE_OTHER): Payer: Medicare Other | Admitting: Family

## 2022-11-18 ENCOUNTER — Encounter: Payer: Self-pay | Admitting: Family

## 2022-11-18 VITALS — BP 156/83 | HR 79 | Temp 98.0°F | Ht 68.0 in | Wt 223.0 lb

## 2022-11-18 DIAGNOSIS — J441 Chronic obstructive pulmonary disease with (acute) exacerbation: Secondary | ICD-10-CM

## 2022-11-18 DIAGNOSIS — R918 Other nonspecific abnormal finding of lung field: Secondary | ICD-10-CM | POA: Diagnosis not present

## 2022-11-18 DIAGNOSIS — R059 Cough, unspecified: Secondary | ICD-10-CM | POA: Diagnosis not present

## 2022-11-18 MED ORDER — DOXYCYCLINE HYCLATE 100 MG PO TABS: 100.0000 mg | ORAL_TABLET | Freq: Two times a day (BID) | ORAL | 0 refills | Status: DC

## 2022-11-18 MED ORDER — PREDNISONE 20 MG PO TABS: 40.0000 mg | ORAL_TABLET | Freq: Every day | ORAL | 0 refills | Status: AC

## 2022-11-18 NOTE — Patient Instructions (Signed)
Community-Acquired Pneumonia, Adult Pneumonia is a lung infection that causes inflammation and the buildup of mucus and fluids in the lungs. This may cause coughing and difficulty breathing. Community-acquired pneumonia is pneumonia that develops in people who are not, and have not recently been, in a hospital or other health care facility. Usually, pneumonia develops as a result of an illness that is caused by a virus, such as the common cold and the flu (influenza). It can also be caused by bacteria or fungi. While the common cold and influenza can pass from person to person (are contagious), pneumonia itself is not considered contagious. What are the causes? This condition may be caused by: Viruses. Bacteria. Fungi. What increases the risk? The following factors may make you more likely to develop this condition: Being over age 65 or having certain medical conditions, such as: A long-term (chronic) disease, such as: chronic obstructive pulmonary disease (COPD), asthma, heart failure, diabetes, or kidney disease. A condition that increases the risk of breathing in (aspirating) mucus and other fluids from your mouth and nose. A weakened body defense system (immune system). Having had your spleen removed (splenectomy). The spleen is the organ that helps fight germs and infections. Not cleaning your teeth and gums well (poor dental hygiene). Using tobacco products. Traveling to places where germs that cause pneumonia are present or being near certain animals or animal habitats that could have germs that cause pneumonia. What are the signs or symptoms? Symptoms of this condition include: A dry cough or a wet (productive) cough. A fever, sweating, or chills. Chest pain, especially when breathing deeply or coughing. Fast breathing, difficulty breathing, or shortness of breath. Tiredness (fatigue) and muscle aches. How is this diagnosed? This condition may be diagnosed based on your medical  history or a physical exam. You may also have tests, including: Imaging, such as a chest X-ray or lung ultrasound. Tests of: The level of oxygen and other gases in your blood. Mucus from your lungs (sputum). Fluid around your lungs (pleural fluid). Your urine. How is this treated? Treatment for this condition depends on many factors, such as the cause of your pneumonia, your medicines, and other medical conditions that you have. For most adults, pneumonia may be treated at home. In some cases, treatment must happen in a hospital and may include: Medicines that are given by mouth (orally) or through an IV, including: Antibiotic medicines, if bacteria caused the pneumonia. Medicines that kill viruses (antiviral medicines), if a virus caused the pneumonia. Oxygen therapy. Severe pneumonia, although rare, may require the following treatments: Mechanical ventilation.This procedure uses a machine to help you breathe if you cannot breathe well on your own or maintain a safe level of blood oxygen. Thoracentesis. This procedure removes any buildup of pleural fluid to help with breathing. Follow these instructions at home:  Medicines Take over-the-counter and prescription medicines only as told by your health care provider. Take cough medicine only if you have trouble sleeping. Cough medicine can prevent your body from removing mucus from your lungs. If you were prescribed antibiotics, take them as told by your health care provider. Do not stop taking the antibiotic even if you start to feel better. Lifestyle     Do not drink alcohol. Do not use any products that contain nicotine or tobacco. These products include cigarettes, chewing tobacco, and vaping devices, such as e-cigarettes. If you need help quitting, ask your health care provider. Eat a healthy diet. This includes plenty of vegetables, fruits, whole grains, low-fat   dairy products, and lean protein. General instructions Rest a lot and  get at least 8 hours of sleep each night. Sleep in a partly upright position at night. Place a few pillows under your head or sleep in a reclining chair. Return to your normal activities as told by your health care provider. Ask your health care provider what activities are safe for you. Drink enough fluid to keep your urine pale yellow. This helps to thin the mucus in your lungs. If your throat is sore, gargle with a mixture of salt and water 3-4 times a day or as needed. To make salt water, completely dissolve -1 tsp (3-6 g) of salt in 1 cup (237 mL) of warm water. Keep all follow-up visits. How is this prevented? You can lower your risk of developing community-acquired pneumonia by: Getting the pneumonia vaccine. There are different types and schedules of pneumonia vaccines. Ask your health care provider which option is best for you. Consider getting the pneumonia vaccine if: You are older than 80 years of age. You are 19-65 years of age and are receiving cancer treatment, have chronic lung disease, or have other medical conditions that affect your immune system. Ask your health care provider if this applies to you. Getting your influenza vaccine every year. Ask your health care provider which type of vaccine is best for you. Getting regular dental checkups. Washing your hands often with soap and water for at least 20 seconds. If soap and water are not available, use hand sanitizer. Contact a health care provider if: You have a fever. You have trouble sleeping because you cannot control your cough with cough medicine. Get help right away if: Your shortness of breath becomes worse. Your chest pain increases. Your sickness becomes worse, especially if you are an older adult or have a weak immune system. You cough up blood. These symptoms may be an emergency. Get help right away. Call 911. Do not wait to see if the symptoms will go away. Do not drive yourself to the  hospital. Summary Pneumonia is an infection of the lungs. Community-acquired pneumonia develops in people who have not been in the hospital. It can be caused by bacteria, viruses, or fungi. This condition may be treated with antibiotics or antiviral medicines. Severe pneumonia may require a hospital stay and treatment to help with breathing. This information is not intended to replace advice given to you by your health care provider. Make sure you discuss any questions you have with your health care provider. Document Revised: 02/12/2022 Document Reviewed: 02/12/2022 Elsevier Patient Education  2023 Elsevier Inc.  

## 2022-11-18 NOTE — Progress Notes (Signed)
Subjective:    Patient ID: Vincent Collins, male    DOB: 1941/05/08, 81 y.o.   MRN: [id number]  Chief Complaint  Patient presents with   Cough    X 2 weeks and congestion   Pt presents to the office today for cough for two weeks.  Cough This is a new problem. The current episode started 1 to 4 weeks ago. The problem has been gradually worsening. The problem occurs every few minutes. The cough is Productive of sputum. Associated symptoms include headaches, myalgias, nasal congestion and shortness of breath. Pertinent negatives include no chills, ear congestion, ear pain or fever. He has tried rest and OTC cough suppressant for the symptoms. The treatment provided mild relief.      Review of Systems  Constitutional:  Negative for chills and fever.  HENT:  Negative for ear pain.   Respiratory:  Positive for cough and shortness of breath.   Musculoskeletal:  Positive for myalgias.  Neurological:  Positive for headaches.  All other systems reviewed and are negative.      Objective:   Physical Exam Vitals reviewed.  Constitutional:      General: He is not in acute distress.    Appearance: He is well-developed. He is obese.  HENT:     Head: Normocephalic.  Eyes:     General:        Right eye: No discharge.        Left eye: No discharge.     Pupils: Pupils are equal, round, and reactive to light.  Neck:     Thyroid: No thyromegaly.  Cardiovascular:     Rate and Rhythm: Normal rate and regular rhythm.     Heart sounds: Normal heart sounds. No murmur heard. Pulmonary:     Effort: Pulmonary effort is normal. No respiratory distress.     Breath sounds: Rales present. No wheezing.  Abdominal:     General: Bowel sounds are normal. There is no distension.     Palpations: Abdomen is soft.     Tenderness: There is no abdominal tenderness.  Musculoskeletal:        General: No tenderness. Normal range of motion.     Cervical back: Normal range of motion and neck supple.  Skin:     General: Skin is warm and dry.     Findings: No erythema or rash.  Neurological:     Mental Status: He is alert and oriented to person, place, and time.     Cranial Nerves: No cranial nerve deficit.     Deep Tendon Reflexes: Reflexes are normal and symmetric.  Psychiatric:        Behavior: Behavior normal.        Thought Content: Thought content normal.        Judgment: Judgment normal.      BP (!) 156/83   Pulse 79   Temp 98 F (36.7 C) (Temporal)   Ht 5\' 8"  (1.727 m)   Wt 223 lb (101.2 kg)   SpO2 95%   BMI 33.91 kg/m       Assessment & Plan:  Vincent Collins comes in today with chief complaint of Cough (X 2 weeks and congestion)   Diagnosis and orders addressed:  1. COPD exacerbation (HCC) Start doxcycline  Continue Albuterol  Strict low carb diet with prednisone  - doxycycline (VIBRA-TABS) 100 MG tablet; Take 1 tablet (100 mg total) by mouth 2 (two) times daily.  Dispense: 20 tablet; Refill: 0 - predniSONE (  DELTASONE) 20 MG tablet; Take 2 tablets (40 mg total) by mouth daily with breakfast for 5 days.  Dispense: 10 tablet; Refill: 0 - DG Chest 2 View   [place], [place]

## 2022-11-28 ENCOUNTER — Encounter: Payer: Self-pay | Admitting: Family Medicine

## 2022-11-28 ENCOUNTER — Ambulatory Visit (INDEPENDENT_AMBULATORY_CARE_PROVIDER_SITE_OTHER): Payer: Medicare Other | Admitting: Family Medicine

## 2022-11-28 VITALS — BP 137/74 | HR 82 | Ht 68.0 in | Wt 228.0 lb

## 2022-11-28 DIAGNOSIS — J449 Chronic obstructive pulmonary disease, unspecified: Secondary | ICD-10-CM | POA: Diagnosis not present

## 2022-11-28 DIAGNOSIS — E1169 Type 2 diabetes mellitus with other specified complication: Secondary | ICD-10-CM | POA: Diagnosis not present

## 2022-11-28 DIAGNOSIS — J189 Pneumonia, unspecified organism: Secondary | ICD-10-CM

## 2022-11-28 DIAGNOSIS — I10 Essential (primary) hypertension: Secondary | ICD-10-CM

## 2022-11-28 DIAGNOSIS — E785 Hyperlipidemia, unspecified: Secondary | ICD-10-CM | POA: Diagnosis not present

## 2022-11-28 LAB — BAYER DCA HB A1C WAIVED: HB A1C (BAYER DCA - WAIVED): 6.9 % — ABNORMAL HIGH (ref 4.8–5.6)

## 2022-11-28 NOTE — Progress Notes (Signed)
BP 137/74   Pulse 82   Ht _0  (1.727 m)   Wt 228 lb (103.4 kg)   SpO2 97%   BMI 34.67 kg/m    Subjective:   Patient ID: Vincent Collins, male    DOB: 1941/01/26, 81 y.o.   MRN: [id number]  HPI: Vincent Collins is a 81 y.o. male presenting on 11/28/2022 for Medical Management of Chronic Issues, Diabetes, and Hyperlipidemia   HPI Type 2 diabetes mellitus Patient comes in today for recheck of his diabetes. Patient has been currently taking metformin. Patient is currently on an ACE inhibitor/ARB. Patient has not seen an ophthalmologist this year. Patient denies any issues with their feet. The symptom started onset as an adult hyperlipidemia and hypertension ARE RELATED TO DM   Hyperlipidemia Patient is coming in for recheck of his hyperlipidemia. The patient is currently taking Lipitor. They deny any issues with myalgias or history of liver damage from it. They deny any focal numbness or weakness or chest pain.   COPD recheck and pneumonia follow-up Patient is coming in for COPD recheck today.  He is currently on Flonase and just finished the doxycycline for an exacerbation also use albuterol.  He has a mild chronic cough but denies any major coughing spells or wheezing spells.  He has 1nighttime symptoms per week and 2daytime symptoms per week currently.   Hypertension Patient is currently on lisinopril and metoprolol, and their blood pressure today is 159/76 and 137/74. Patient denies any lightheadedness or dizziness. Patient denies headaches, blurred vision, chest pains, shortness of breath, or weakness. Denies any side effects from medication and is content with current medication.   Relevant past medical, surgical, family and social history reviewed and updated as indicated. Interim medical history since our last visit reviewed. Allergies and medications reviewed and updated.  Review of Systems  Constitutional:  Negative for chills and fever.  HENT:  Positive for congestion.    Eyes:  Negative for visual disturbance.  Respiratory:  Positive for cough. Negative for shortness of breath and wheezing.   Cardiovascular:  Negative for chest pain and leg swelling.  Musculoskeletal:  Negative for back pain and gait problem.  Skin:  Negative for rash.  Neurological:  Negative for dizziness and weakness.  All other systems reviewed and are negative.   Per HPI unless specifically indicated above   Allergies as of 11/28/2022   No Known Allergies      Medication List        Accurate as of November 28, 2022  3:28 PM. If you have any questions, ask your nurse or doctor.          STOP taking these medications    doxycycline 100 MG tablet Commonly known as: VIBRA-TABS Stopped by: Fransisca Kaufmann Michaila Kenney, MD       TAKE these medications    Accu-Chek Guide test strip Generic drug: glucose blood TEST BLOOD SUGAR DAILY; DX: E11.9.   Accu-Chek Guide w/Device Kit TEST BLOOD SUGAR DAILY; DX: E11.9.   acetaminophen 325 MG tablet Commonly known as: TYLENOL Take 2 tablets (650 mg total) by mouth every 6 (six) hours as needed for mild pain (or Fever >/= 101).   albuterol (2.5 MG/3ML) 0.083% nebulizer solution Commonly known as: PROVENTIL USE 1 VIAL VIA NEBULIZER EVERY 6 HOURS AS NEEDED FOR WHEEZING OR  SHORTNESS OF BREATH   atorvastatin 40 MG tablet Commonly known as: LIPITOR Take 1 tablet (40 mg total) by mouth daily.   CO-Q 10 Omega-3  Fish Oil Caps Take 1 capsule by mouth in the morning. 122m Capsule Qunol   Eliquis 5 MG Tabs tablet Generic drug: apixaban Take 1 tablet by mouth twice daily   fluticasone 50 MCG/ACT nasal spray Commonly known as: FLONASE 1 spray per nostril daily   lisinopril 40 MG tablet Commonly known as: ZESTRIL Take 1 tablet by mouth once daily   meclizine 25 MG tablet Commonly known as: ANTIVERT Take 1 tablet (25 mg total) by mouth 3 (three) times daily as needed for dizziness.   metFORMIN 500 MG 24 hr tablet Commonly known  as: GLUCOPHAGE-XR Take 1 tablet (500 mg total) by mouth 2 (two) times daily.   metoprolol succinate 50 MG 24 hr tablet Commonly known as: TOPROL-XL Take 1 tablet (50 mg total) by mouth daily. Take with or immediately following a meal.   Microlet Lancets Misc USE TO TEST BLOOD SUGAR DAILY; DX: E11.9. PATIENT HAS GLUCOMETER AND LANCING DEVICE   omeprazole 20 MG capsule Commonly known as: PRILOSEC Take 1 capsule (20 mg total) by mouth daily.   tamsulosin 0.4 MG Caps capsule Commonly known as: FLOMAX Take 2 capsules daily after supper   Vitamin D 1000 units capsule Take 1,000 Units by mouth in the morning.         Objective:   BP 137/74   Pulse 82   Ht _0  (1.727 m)   Wt 228 lb (103.4 kg)   SpO2 97%   BMI 34.67 kg/m   Wt Readings from Last 3 Encounters:  11/28/22 228 lb (103.4 kg)  11/18/22 223 lb (101.2 kg)  09/08/22 233 lb (105.7 kg)    Physical Exam Vitals and nursing note reviewed.  Constitutional:      General: He is not in acute distress.    Appearance: He is well-developed. He is not diaphoretic.  Eyes:     General: No scleral icterus.    Conjunctiva/sclera: Conjunctivae normal.  Neck:     Thyroid: No thyromegaly.  Cardiovascular:     Rate and Rhythm: Normal rate and regular rhythm.     Heart sounds: Normal heart sounds. No murmur heard. Pulmonary:     Effort: Pulmonary effort is normal. No respiratory distress.     Breath sounds: Normal breath sounds. No wheezing or rhonchi.  Musculoskeletal:        General: Normal range of motion.     Cervical back: Neck supple.  Lymphadenopathy:     Cervical: No cervical adenopathy.  Skin:    General: Skin is warm and dry.     Findings: No rash.  Neurological:     Mental Status: He is alert and oriented to person, place, and time.     Coordination: Coordination normal.  Psychiatric:        Behavior: Behavior normal.       Assessment & Plan:   Problem List Items Addressed This Visit        Cardiovascular and Mediastinum   Essential hypertension   Relevant Orders   CBC with Differential/Platelet   CMP14+EGFR   Lipid panel   Bayer DCA Hb A1c Waived     Respiratory   COPD (chronic obstructive pulmonary disease) (HCC)     Endocrine   Hyperlipidemia associated with type 2 diabetes mellitus ([facility]   Relevant Orders   CBC with Differential/Platelet   CMP14+EGFR   Lipid panel   Bayer DCA Hb A1c Waived   Type 2 diabetes mellitus ([facility] - [facility]   Relevant Orders   CBC  with Differential/Platelet   CMP14+EGFR   Lipid panel   Bayer DCA Hb A1c Waived   Other Visit Diagnoses     Community acquired pneumonia, unspecified laterality           His breathing is doing much better, he still has some residual cough but it is much improved, recommended Mucinex and continue with his albuterol to help clear it out.  Because if it worsens.  A1c looks good at 6.9, slightly more elevated but still okay, no changes Follow up plan: Return in about 3 months (around 02/27/2023), or if symptoms worsen or fail to improve, for Diabetes hypertension.  Counseling provided for all of the vaccine components Orders Placed This Encounter  Procedures   CBC with Differential/Platelet   CMP14+EGFR   Lipid panel   Bayer DCA Hb A1c Waived    Caryl Pina, MD [facility] 11/28/2022, 3:28 PM

## 2022-11-29 LAB — CMP14+EGFR
ALT: 14 IU/L (ref 0–44)
AST: 14 IU/L (ref 0–40)
Albumin/Globulin Ratio: 1.5 (ref 1.2–2.2)
Albumin: 3.3 g/dL — ABNORMAL LOW (ref 3.8–4.8)
Alkaline Phosphatase: 101 IU/L (ref 44–121)
BUN/Creatinine Ratio: 12 (ref 10–24)
BUN: 11 mg/dL (ref 8–27)
Bilirubin Total: 0.5 mg/dL (ref 0.0–1.2)
CO2: 23 mmol/L (ref 20–29)
Calcium: 8.2 mg/dL — ABNORMAL LOW (ref 8.6–10.2)
Chloride: 102 mmol/L (ref 96–106)
Creatinine, Ser: 0.94 mg/dL (ref 0.76–1.27)
Globulin, Total: 2.2 g/dL (ref 1.5–4.5)
Glucose: 195 mg/dL — ABNORMAL HIGH (ref 70–99)
Potassium: 4.3 mmol/L (ref 3.5–5.2)
Sodium: 140 mmol/L (ref 134–144)
Total Protein: 5.5 g/dL — ABNORMAL LOW (ref 6.0–8.5)
eGFR: 82 mL/min/{1.73_m2} (ref >59)

## 2022-11-29 LAB — CBC WITH DIFFERENTIAL/PLATELET
Basophils Absolute: 0.1 10*3/uL (ref 0.0–0.2)
Basos: 1 %
EOS (ABSOLUTE): 0.2 10*3/uL (ref 0.0–0.4)
Eos: 3 %
Hematocrit: 44.1 % (ref 37.5–51.0)
Hemoglobin: 14.7 g/dL (ref 13.0–17.7)
Immature Grans (Abs): 0.1 10*3/uL (ref 0.0–0.1)
Immature Granulocytes: 1 %
Lymphocytes Absolute: 1.4 10*3/uL (ref 0.7–3.1)
Lymphs: 20 %
MCH: 29.5 pg (ref 26.6–33.0)
MCHC: 33.3 g/dL (ref 31.5–35.7)
MCV: 89 fL (ref 79–97)
Monocytes Absolute: 0.7 10*3/uL (ref 0.1–0.9)
Monocytes: 9 %
Neutrophils Absolute: 4.9 10*3/uL (ref 1.4–7.0)
Neutrophils: 66 %
Platelets: 203 10*3/uL (ref 150–450)
RBC: 4.98 x10E6/uL (ref 4.14–5.80)
RDW: 13.2 % (ref 11.6–15.4)
WBC: 7.3 10*3/uL (ref 3.4–10.8)

## 2022-11-29 LAB — MICROALBUMIN / CREATININE URINE RATIO
Creatinine, Urine: 97.6 mg/dL
Microalb/Creat Ratio: 9 mg/g creat (ref 0–29)
Microalbumin, Urine: 8.4 ug/mL

## 2022-11-29 LAB — LIPID PANEL
Chol/HDL Ratio: 2.4 ratio (ref 0.0–5.0)
Cholesterol, Total: 84 mg/dL — ABNORMAL LOW (ref 100–199)
HDL: 35 mg/dL — ABNORMAL LOW (ref >39)
LDL Chol Calc (NIH): 23 mg/dL (ref 0–99)
Triglycerides: 155 mg/dL — ABNORMAL HIGH (ref 0–149)
VLDL Cholesterol Cal: 26 mg/dL (ref 5–40)

## 2022-12-05 ENCOUNTER — Ambulatory Visit: Payer: Medicare Other | Admitting: Family Medicine

## 2022-12-06 ENCOUNTER — Other Ambulatory Visit: Payer: Self-pay | Admitting: Family Medicine

## 2022-12-06 DIAGNOSIS — E1159 Type 2 diabetes mellitus with other circulatory complications: Secondary | ICD-10-CM

## 2022-12-06 DIAGNOSIS — J019 Acute sinusitis, unspecified: Secondary | ICD-10-CM

## 2023-01-05 ENCOUNTER — Encounter: Payer: Self-pay | Admitting: Nurse Practitioner

## 2023-01-05 ENCOUNTER — Ambulatory Visit (INDEPENDENT_AMBULATORY_CARE_PROVIDER_SITE_OTHER): Payer: Medicare Other

## 2023-01-05 ENCOUNTER — Ambulatory Visit (INDEPENDENT_AMBULATORY_CARE_PROVIDER_SITE_OTHER): Payer: Medicare Other | Admitting: Nurse Practitioner

## 2023-01-05 VITALS — BP 195/93 | HR 77 | Ht 68.0 in | Wt 229.0 lb

## 2023-01-05 DIAGNOSIS — R509 Fever, unspecified: Secondary | ICD-10-CM | POA: Diagnosis not present

## 2023-01-05 DIAGNOSIS — J069 Acute upper respiratory infection, unspecified: Secondary | ICD-10-CM

## 2023-01-05 DIAGNOSIS — R0789 Other chest pain: Secondary | ICD-10-CM | POA: Diagnosis not present

## 2023-01-05 LAB — VERITOR FLU A/B WAIVED
Influenza A: NEGATIVE
Influenza B: NEGATIVE

## 2023-01-05 MED ORDER — GUAIFENESIN ER 600 MG PO TB12: 600.0000 mg | ORAL_TABLET | Freq: Two times a day (BID) | ORAL | 0 refills | Status: DC

## 2023-01-05 NOTE — Patient Instructions (Signed)

## 2023-01-05 NOTE — Progress Notes (Signed)
Acute Office Visit  Subjective:     Patient ID: Vincent Collins, male    DOB: 01-30-1941, 82 y.o.   MRN: [id number]  Chief Complaint  Patient presents with   Nasal Congestion    URI  This is a recurrent problem. The current episode started in the past 7 days. The problem has been unchanged. Associated symptoms include congestion and coughing. Pertinent negatives include no headaches, joint pain, joint swelling or rash. He has tried nothing for the symptoms.    Review of Systems  Constitutional:  Positive for chills. Negative for malaise/fatigue.  HENT:  Positive for congestion.   Respiratory:  Positive for cough.   Cardiovascular: Negative.   Musculoskeletal:  Negative for joint pain.  Skin: Negative.  Negative for itching and rash.  Neurological:  Negative for headaches.  All other systems reviewed and are negative.       Objective:    BP (!) 195/93   Pulse 77   Ht 5\' 8"  (1.727 m)   Wt 229 lb (103.9 kg)   SpO2 94%   BMI 34.82 kg/m  BP Readings from Last 3 Encounters:  01/05/23 (!) 195/93  11/28/22 137/74  11/18/22 (!) 156/83   Wt Readings from Last 3 Encounters:  01/05/23 229 lb (103.9 kg)  11/28/22 228 lb (103.4 kg)  11/18/22 223 lb (101.2 kg)      Physical Exam Vitals and nursing note reviewed.  Constitutional:      Appearance: Normal appearance. He is obese.  HENT:     Right Ear: External ear normal.     Left Ear: External ear normal.     Nose: Congestion present.     Mouth/Throat:     Mouth: Mucous membranes are moist.     Pharynx: Oropharynx is clear.  Eyes:     Conjunctiva/sclera: Conjunctivae normal.  Cardiovascular:     Rate and Rhythm: Normal rate and regular rhythm.     Pulses: Normal pulses.     Heart sounds: Normal heart sounds.  Pulmonary:     Effort: Pulmonary effort is normal.     Breath sounds: Normal breath sounds.  Abdominal:     General: Bowel sounds are normal.  Skin:    General: Skin is warm.     Findings: No erythema or  rash.  Neurological:     General: No focal deficit present.     Mental Status: He is alert.     No results found for any visits on 01/05/23.      Assessment & Plan:  Patient presents with Upper respiratory symptoms with cough and congestion. Completed chest X-ray, RSV?Covid-19 and rapid flu swabs with results pending Take meds as prescribed - Use a cool mist humidifier  -Use saline nose sprays frequently -Force fluids -For fever or aches or pains- take Tylenol or ibuprofen. Follow up with worsening unresolved symptoms    Problem List Items Addressed This Visit   None Visit Diagnoses     Fever and chills    -  Primary   Relevant Orders   Veritor Flu A/B Waived   COVID-19, Flu A+B and RSV   DG Chest 2 View   URI with cough and congestion       Relevant Medications   guaiFENesin (MUCINEX) 600 MG 12 hr tablet   Other Relevant Orders   DG Chest 2 View       Meds ordered this encounter  Medications   guaiFENesin (MUCINEX) 600 MG 12 hr tablet  Sig: Take 1 tablet (600 mg total) by mouth 2 (two) times daily.    Dispense:  30 tablet    Refill:  0    Order Specific Question:   Supervising Provider    Answer:   Claretta Fraise [phone]    No follow-ups on file.  Ivy Lynn, NP

## 2023-01-06 ENCOUNTER — Other Ambulatory Visit: Payer: Self-pay | Admitting: Cardiovascular Disease

## 2023-01-06 LAB — COVID-19, FLU A+B AND RSV
Influenza A, NAA: NOT DETECTED
Influenza B, NAA: NOT DETECTED
RSV, NAA: NOT DETECTED
SARS-CoV-2, NAA: NOT DETECTED

## 2023-01-06 NOTE — Telephone Encounter (Signed)
Eliquis 5mg  refill request received. Patient is 82 years old, weight-103.9kg, Crea-0.94 on 11/28/2022, Diagnosis-Afib, and last seen by Dr. Marisue Ivan on 02/07/2022 and pending appt with Dr. Marisue Ivan on 04/10/23. Dose is appropriate based on dosing criteria. Will send in refill to requested pharmacy.

## 2023-02-13 ENCOUNTER — Other Ambulatory Visit: Payer: Self-pay | Admitting: Family Medicine

## 2023-02-13 ENCOUNTER — Encounter: Payer: Self-pay | Admitting: Family Medicine

## 2023-02-13 DIAGNOSIS — K2950 Unspecified chronic gastritis without bleeding: Secondary | ICD-10-CM

## 2023-02-13 DIAGNOSIS — I10 Essential (primary) hypertension: Secondary | ICD-10-CM

## 2023-02-13 DIAGNOSIS — N401 Enlarged prostate with lower urinary tract symptoms: Secondary | ICD-10-CM

## 2023-02-13 DIAGNOSIS — E1169 Type 2 diabetes mellitus with other specified complication: Secondary | ICD-10-CM

## 2023-02-13 DIAGNOSIS — I4819 Other persistent atrial fibrillation: Secondary | ICD-10-CM

## 2023-02-13 NOTE — Telephone Encounter (Signed)
CALLED PT TO SCHEDULE APPT N/A NO VM LETTER MAILED

## 2023-02-13 NOTE — Telephone Encounter (Signed)
Dettinger NTBS in March for 3 mos FU from Dec. Refill Sent to pharmacy

## 2023-03-05 ENCOUNTER — Encounter: Payer: Self-pay | Admitting: Family Medicine

## 2023-03-05 ENCOUNTER — Ambulatory Visit (INDEPENDENT_AMBULATORY_CARE_PROVIDER_SITE_OTHER): Payer: Medicare Other | Admitting: Family Medicine

## 2023-03-05 VITALS — BP 153/73 | HR 62 | Ht 68.0 in | Wt 226.0 lb

## 2023-03-05 DIAGNOSIS — E785 Hyperlipidemia, unspecified: Secondary | ICD-10-CM

## 2023-03-05 DIAGNOSIS — E1159 Type 2 diabetes mellitus with other circulatory complications: Secondary | ICD-10-CM | POA: Diagnosis not present

## 2023-03-05 DIAGNOSIS — E1169 Type 2 diabetes mellitus with other specified complication: Secondary | ICD-10-CM

## 2023-03-05 DIAGNOSIS — I152 Hypertension secondary to endocrine disorders: Secondary | ICD-10-CM | POA: Diagnosis not present

## 2023-03-05 DIAGNOSIS — I10 Essential (primary) hypertension: Secondary | ICD-10-CM

## 2023-03-05 LAB — BAYER DCA HB A1C WAIVED: HB A1C (BAYER DCA - WAIVED): 6.1 % — ABNORMAL HIGH (ref 4.8–5.6)

## 2023-03-05 NOTE — Progress Notes (Signed)
BP (!) 153/73   Pulse 62   Ht '5\' 8"'$  (1.727 m)   Wt 226 lb (102.5 kg)   SpO2 97%   BMI 34.36 kg/m    Subjective:   Patient ID: Vincent Collins, male    DOB: 23-Feb-1941, 82 y.o.   MRN: [id number]  HPI: Vincent Collins is a 82 y.o. male presenting on 03/05/2023 for Medical Management of Chronic Issues, Diabetes, and Hyperlipidemia   HPI Type 2 diabetes mellitus Patient comes in today for recheck of his diabetes. Patient has been currently taking metformin. Patient is currently on an ACE inhibitor/ARB. Patient has not seen an ophthalmologist this year. Patient denies any issues with their feet. The symptom started onset as an adult hypertension and hyperlipidemia and CAD ARE RELATED TO DM   Hypertension Patient is currently on lisinopril and metoprolol, and their blood pressure today is 153/73. Patient denies any lightheadedness or dizziness. Patient denies headaches, blurred vision, chest pains, shortness of breath, or weakness. Denies any side effects from medication and is content with current medication.   Hyperlipidemia Patient is coming in for recheck of his hyperlipidemia. The patient is currently taking atorvastatin. They deny any issues with myalgias or history of liver damage from it. They deny any focal numbness or weakness or chest pain.   Relevant past medical, surgical, family and social history reviewed and updated as indicated. Interim medical history since our last visit reviewed. Allergies and medications reviewed and updated.  Review of Systems  Constitutional:  Negative for chills and fever.  Eyes:  Negative for visual disturbance.  Respiratory:  Negative for shortness of breath and wheezing.   Cardiovascular:  Negative for chest pain and leg swelling.  Musculoskeletal:  Negative for back pain and gait problem.  Skin:  Negative for rash.  Neurological:  Negative for dizziness, weakness and headaches.  All other systems reviewed and are negative.   Per HPI unless  specifically indicated above   Allergies as of 03/05/2023   No Known Allergies      Medication List        Accurate as of March 05, 2023  2:08 PM. If you have any questions, ask your nurse or doctor.          Accu-Chek Guide test strip Generic drug: glucose blood TEST BLOOD SUGAR DAILY; DX: E11.9.   Accu-Chek Guide w/Device Kit TEST BLOOD SUGAR DAILY; DX: E11.9.   acetaminophen 325 MG tablet Commonly known as: TYLENOL Take 2 tablets (650 mg total) by mouth every 6 (six) hours as needed for mild pain (or Fever >/= 101).   albuterol (2.5 MG/3ML) 0.083% nebulizer solution Commonly known as: PROVENTIL USE 1 VIAL VIA NEBULIZER EVERY 6 HOURS AS NEEDED FOR WHEEZING OR  SHORTNESS OF BREATH   atorvastatin 40 MG tablet Commonly known as: LIPITOR TAKE 1 TABLET BY MOUTH DAILY   CO-Q 10 Omega-3 Fish Oil Caps Take 1 capsule by mouth in the morning. '100mg'$  Capsule Qunol   Eliquis 5 MG Tabs tablet Generic drug: apixaban Take 1 tablet by mouth twice daily   fluticasone 50 MCG/ACT nasal spray Commonly known as: FLONASE USE 1 SPRAY IN BOTH  NOSTRILS ONCE DAILY   guaiFENesin 600 MG 12 hr tablet Commonly known as: Mucinex Take 1 tablet (600 mg total) by mouth 2 (two) times daily.   lisinopril 40 MG tablet Commonly known as: ZESTRIL Take 1 tablet by mouth once daily   meclizine 25 MG tablet Commonly known as: ANTIVERT Take 1 tablet (  25 mg total) by mouth 3 (three) times daily as needed for dizziness.   metFORMIN 500 MG 24 hr tablet Commonly known as: GLUCOPHAGE-XR TAKE 1 TABLET BY MOUTH TWICE  DAILY   metoprolol succinate 50 MG 24 hr tablet Commonly known as: TOPROL-XL TAKE 1 TABLET BY MOUTH DAILY  WITH OR IMMEDIATELY FOLLOWING A  MEAL   Microlet Lancets Misc USE TO TEST BLOOD SUGAR DAILY; DX: E11.9. PATIENT HAS GLUCOMETER AND LANCING DEVICE   omeprazole 20 MG capsule Commonly known as: PRILOSEC TAKE 1 CAPSULE BY MOUTH DAILY   tamsulosin 0.4 MG Caps capsule Commonly  known as: FLOMAX TAKE 2 CAPSULES BY MOUTH DAILY  AFTER SUPPER   Vitamin D 1000 units capsule Take 1,000 Units by mouth in the morning.         Objective:   BP (!) 153/73   Pulse 62   Ht '5\' 8"'$  (1.727 m)   Wt 226 lb (102.5 kg)   SpO2 97%   BMI 34.36 kg/m   Wt Readings from Last 3 Encounters:  03/05/23 226 lb (102.5 kg)  01/05/23 229 lb (103.9 kg)  11/28/22 228 lb (103.4 kg)    Physical Exam Vitals and nursing note reviewed.  Constitutional:      General: He is not in acute distress.    Appearance: He is well-developed. He is not diaphoretic.  Eyes:     General: No scleral icterus.    Conjunctiva/sclera: Conjunctivae normal.  Neck:     Thyroid: No thyromegaly.  Cardiovascular:     Rate and Rhythm: Normal rate and regular rhythm.     Heart sounds: Normal heart sounds. No murmur heard. Pulmonary:     Effort: Pulmonary effort is normal. No respiratory distress.     Breath sounds: Normal breath sounds. No wheezing.  Musculoskeletal:        General: No swelling. Normal range of motion.     Cervical back: Neck supple.  Lymphadenopathy:     Cervical: No cervical adenopathy.  Skin:    General: Skin is warm and dry.     Findings: No rash.  Neurological:     Mental Status: He is alert and oriented to person, place, and time.     Coordination: Coordination normal.  Psychiatric:        Behavior: Behavior normal.       Assessment & Plan:   Problem List Items Addressed This Visit       Cardiovascular and Mediastinum   Essential hypertension     Endocrine   Hyperlipidemia associated with type 2 diabetes mellitus ([place])   Type 2 diabetes mellitus ([place]) - Primary   Relevant Orders   Bayer DCA Hb A1c Waived   BMP8+EGFR    A1c looks good at 6.1.  Will add amlodipine 5 mg because his blood pressure is up here and has also been running at home. Follow up plan: Return in about 3 months (around 06/05/2023), or if symptoms worsen or fail to improve, for Diabetes and  hypertension and hyperlipidemia.  Counseling provided for all of the vaccine components Orders Placed This Encounter  Procedures   Bayer [facility] Hb A1c Waived   [place], MD [facility] 03/05/2023, 2:08 PM

## 2023-03-06 LAB — BMP8+EGFR
BUN/Creatinine Ratio: 10 (ref 10–24)
BUN: 10 mg/dL (ref 8–27)
CO2: 23 mmol/L (ref 20–29)
Calcium: 8.8 mg/dL (ref 8.6–10.2)
Chloride: 101 mmol/L (ref 96–106)
Creatinine, Ser: 1.02 mg/dL (ref 0.76–1.27)
Glucose: 104 mg/dL — ABNORMAL HIGH (ref 70–99)
Potassium: 4.1 mmol/L (ref 3.5–5.2)
Sodium: 139 mmol/L (ref 134–144)
eGFR: 74 mL/min/{1.73_m2} (ref >59)

## 2023-03-09 ENCOUNTER — Telehealth: Payer: Self-pay | Admitting: Family Medicine

## 2023-03-09 MED ORDER — AMLODIPINE BESYLATE 5 MG PO TABS: 5.0000 mg | ORAL_TABLET | Freq: Every day | ORAL | 1 refills | Status: DC

## 2023-03-09 NOTE — Telephone Encounter (Signed)
Patients wife calling because patient was seen on 3/7 and was told that he would be getting a new medication called in for high blood pressure and [facility] in [place] does not have the prescription. They were unsure of the name of the medication but stated that it was supposed to be '5mg'$ . Please call back and advise.

## 2023-03-09 NOTE — Telephone Encounter (Signed)
Per Dr. Merita Norton last [place] note he was going to add '5mg'$  of Amlodipine qd.  18msupply Amlodipine '5mg'$  sent to [facility] in [place]. Pt made aware. He has no concerns.

## 2023-04-10 ENCOUNTER — Ambulatory Visit: Payer: Medicare Other | Admitting: Cardiovascular Disease

## 2023-05-17 NOTE — Progress Notes (Unsigned)
Cardiology Office Note:   Date:  05/18/2023  NAME:  Vincent Collins    MRN: [id number] DOB:  [birth year]   PCP:  Dettinger, Elige Radon, MD  Cardiologist:  Reatha Harps, MD  Electrophysiologist:  None   Referring MD: Dettinger, Elige Radon, MD   Chief Complaint  Patient presents with   Follow-up   History of Present Illness:   Vincent Collins is a 82 y.o. male with a hx of persistent atrial fibrillation, hypertension, hyperlipidemia who presents for follow-up.  He reports no A-fib.  No heart racing episodes.  No chest pains or trouble breathing.  BP 187/82.  Values have been elevated.  He was prescribed amlodipine by his primary care physician but reports he did not feel well on this.  We discussed adding Aldactone to see if this helps.  Denies any cardiac symptoms.  CV exam unremarkable.  No bleeding on Eliquis.  He is diabetic.  All of his numbers are at goal.  Problem List 1. Persistent Afib -diagnosed 03/27/2020 2/2 acute pancreatitis -back in NSR with short course of amiodarone  -CHADSVASC = 4 (DM, HTN, age) 2. DM -A1c 6.1 3. HTN 4. COPD 5. HLD -T chol 984, HDL 35, LDL 23, TG 155  Past Medical History: Past Medical History:  Diagnosis Date   Arrhythmia    Broken leg and ribs, right, closed, initial encounter    COPD (chronic obstructive pulmonary disease) (HCC)    Diabetes mellitus without complication (HCC)    Diverticulitis    GERD (gastroesophageal reflux disease)    Hydronephrosis of left kidney    Hyperlipidemia    Hypertension    Left ureteral stone    obstructing   Renal calculus or stone 2015    Past Surgical History: Past Surgical History:  Procedure Laterality Date   2d echo  04/04/09   CHOLECYSTECTOMY N/A 08/03/2020   Procedure: LAPAROSCOPIC CHOLECYSTECTOMY WITH INTRAOPERATIVE CHOLANGIOGRAM;  Surgeon: Gaynelle Adu, MD;  Location: [facility] OR;  Service: General;  Laterality: N/A;   CYSTOSCOPY WITH RETROGRADE PYELOGRAM, URETEROSCOPY AND STENT PLACEMENT Left 10/11/2014    Procedure: CYSTOSCOPY WITH RETROGRADE PYELOGRAM, DIAGNOSTIC URETEROSCOPY AND STENT PLACEMENT;  Surgeon: [name], MD;  Location[facility];  Service: Urology;  Laterality: Left;   LUMBAR DISC SURGERY     SPINE SURGERY      Current Medications: Current Meds  Medication Sig   acetaminophen (TYLENOL) 325 MG tablet Take 2 tablets (650 mg total) by mouth every 6 (six) hours as needed for mild pain (or Fever >/= 101).   albuterol (PROVENTIL) (2.5 MG/3ML) 0.083% nebulizer solution USE 1 VIAL VIA NEBULIZER EVERY 6 HOURS AS NEEDED FOR WHEEZING OR  SHORTNESS OF BREATH   atorvastatin (LIPITOR) 40 MG tablet TAKE 1 TABLET BY MOUTH DAILY   Blood Glucose Monitoring Suppl (ACCU-CHEK GUIDE) w/Device KIT TEST BLOOD SUGAR DAILY; DX: E11.9.   Cholecalciferol (VITAMIN D) 1000 UNITS capsule Take 1,000 Units by mouth in the morning.   Coenzyme Q10-Fish Oil-Vit E (CO-Q 10 OMEGA-3 FISH OIL) CAPS Take 1 capsule by mouth in the morning. 100mg  Capsule Qunol   fluticasone (FLONASE) 50 MCG/ACT nasal spray USE 1 SPRAY IN BOTH  NOSTRILS ONCE DAILY   glucose blood (ACCU-CHEK GUIDE) test strip TEST BLOOD SUGAR DAILY; DX: E11.9.   guaiFENesin (MUCINEX) 600 MG 12 hr tablet Take 1 tablet (600 mg total) by mouth 2 (two) times daily.   lisinopril (ZESTRIL) 40 MG tablet Take 1 tablet by mouth once daily  metFORMIN (GLUCOPHAGE-XR) 500 MG 24 hr tablet TAKE 1 TABLET BY MOUTH TWICE  DAILY   Microlet Lancets MISC USE TO TEST BLOOD SUGAR DAILY; DX: E11.9. PATIENT HAS GLUCOMETER AND LANCING DEVICE   omeprazole (PRILOSEC) 20 MG capsule TAKE 1 CAPSULE BY MOUTH DAILY   spironolactone (ALDACTONE) 25 MG tablet Take 1 tablet (25 mg total) by mouth daily.   tamsulosin (FLOMAX) 0.4 MG CAPS capsule TAKE 2 CAPSULES BY MOUTH DAILY  AFTER SUPPER   [DISCONTINUED] amLODipine (NORVASC) 5 MG tablet Take 1 tablet (5 mg total) by mouth daily.   [DISCONTINUED] apixaban (ELIQUIS) 5 MG TABS tablet Take 1 tablet by mouth twice daily    [DISCONTINUED] meclizine (ANTIVERT) 25 MG tablet Take 1 tablet (25 mg total) by mouth 3 (three) times daily as needed for dizziness.   [DISCONTINUED] metoprolol succinate (TOPROL-XL) 50 MG 24 hr tablet TAKE 1 TABLET BY MOUTH DAILY  WITH OR IMMEDIATELY FOLLOWING A  MEAL     Allergies:    Patient has no known allergies.   Social History: Social History   Socioeconomic History   Marital status: Married    Spouse name: Harriett Sine   Number of children: 2   Years of education: 8th   Highest education level: 8th grade  Occupational History   Occupation: [employer]    Comment: part time 6 hour shifts  Tobacco Use   Smoking status: Former    Types: Cigarettes    Quit date: 06/18/1967    Years since quitting: 55.9   Smokeless tobacco: Former    Types: Associate Professor Use: Never used  Substance and Sexual Activity   Alcohol use: No   Drug use: No   Sexual activity: Not Currently  Other Topics Concern   Not on file  Social History Narrative   Lives home with wife - still working part time   Social Determinants of Health   Financial Resource Strain: Low Risk  (05/22/2022)   Overall Financial Resource Strain (CARDIA)    Difficulty of Paying Living Expenses: Not hard at all  Food Insecurity: No Food Insecurity (05/22/2022)   Hunger Vital Sign    Worried About Running Out of Food in the Last Year: Never true    Ran Out of Food in the Last Year: Never true  Transportation Needs: No Transportation Needs (05/22/2022)   PRAPARE - Administrator, Civil Service (Medical): No    Lack of Transportation (Non-Medical): No  Physical Activity: Sufficiently Active (05/22/2022)   Exercise Vital Sign    Days of Exercise per Week: 4 days    Minutes of Exercise per Session: 40 min  Stress: No Stress Concern Present (05/22/2022)   [employer] of Occupational Health - Occupational Stress Questionnaire    Feeling of Stress : Not at all  Social Connections:  Socially Integrated (05/22/2022)   Social Connection and Isolation Panel [NHANES]    Frequency of Communication with Friends and Family: More than three times a week    Frequency of Social Gatherings with Friends and Family: More than three times a week    Attends Religious Services: 1 to 4 times per year    Active Member of [employer] or Organizations: Yes    Attends Banker Meetings: 1 to 4 times per year    Marital Status: Married     Family History: The patient's family history includes COPD in his brother; Cancer in his sister; Early death in his father;  Heart attack in his father.  ROS:   All other ROS reviewed and negative. Pertinent positives noted in the HPI.     EKGs/Labs/Other Studies Reviewed:   The following studies were personally reviewed by me today:  Recent Labs: 11/28/2022: ALT 14; Hemoglobin 14.7; Platelets 203 03/05/2023: BUN 10; Creatinine, Ser 1.02; Potassium 4.1; Sodium 139   Recent Lipid Panel    Component Value Date/Time   CHOL 84 (L) 11/28/2022 1456   TRIG 155 (H) 11/28/2022 1456   TRIG 97 11/02/2013 0949   HDL 35 (L) 11/28/2022 1456   HDL 40 11/02/2013 0949   CHOLHDL 2.4 11/28/2022 1456   CHOLHDL 2.4 09/21/2014 0620   VLDL 21 09/21/2014 0620   LDLCALC 23 11/28/2022 1456   LDLCALC 59 11/02/2013 0949    Physical Exam:   VS:  BP (!) 187/82   Pulse 66   Ht 5\' 8"  (1.727 m)   Wt 225 lb 12.8 oz (102.4 kg)   SpO2 96%   BMI 34.33 kg/m    Wt Readings from Last 3 Encounters:  05/18/23 225 lb 12.8 oz (102.4 kg)  03/05/23 226 lb (102.5 kg)  01/05/23 229 lb (103.9 kg)    General: Well nourished, well developed, in no acute distress Head: Atraumatic, normal size  Eyes: PEERLA, EOMI  Neck: Supple, no JVD Endocrine: No thryomegaly Cardiac: Normal S1, S2; RRR; no murmurs, rubs, or gallops Lungs: Clear to auscultation bilaterally, no wheezing, rhonchi or rales  Abd: Soft, nontender, no hepatomegaly  Ext: No edema, pulses 2+ Musculoskeletal: No  deformities, BUE and BLE strength normal and equal Skin: Warm and dry, no rashes   Neuro: Alert and oriented to person, place, time, and situation, CNII-XII grossly intact, no focal deficits  Psych: Normal mood and affect   ASSESSMENT:   ATILLA BARRACLOUGH is a 82 y.o. male who presents for the following: 1. Persistent atrial fibrillation (HCC)   2. Acquired thrombophilia (HCC)   3. Essential hypertension   4. Mixed hyperlipidemia   5. Vertigo     PLAN:   1. Persistent atrial fibrillation (HCC) 2. Acquired thrombophilia (HCC) -No further recurrence of atrial fibrillation.  Continue metoprolol succinate 50 mg daily.  He will continue Eliquis 5 mg twice daily.  3. Essential hypertension -BP severely elevated.  Did not tolerate amlodipine.  We will continue lisinopril 40 mg daily, metoprolol succinate 50 mg daily.  Add Aldactone 25 mg daily.  Daily blood pressure checks.  We will have him come back for reevaluation in 3 months.  4. Mixed hyperlipidemia -Lipids are at goal.  He is diabetic.  LDL goal less than 70.  Currently on Lipitor 40.  Will continue this.  5. Vertigo -Reports issues with vertigo.  Prescription for meclizine will be provided today.  Disposition: Return in about 3 months (around 08/18/2023).  Medication Adjustments/Labs and Tests Ordered: Current medicines are reviewed at length with the patient today.  Concerns regarding medicines are outlined above.  No orders of the defined types were placed in this encounter.  Meds ordered this encounter  Medications   apixaban (ELIQUIS) 5 MG TABS tablet    Sig: Take 1 tablet (5 mg total) by mouth 2 (two) times daily.    Dispense:  180 tablet    Refill:  3   metoprolol succinate (TOPROL-XL) 50 MG 24 hr tablet    Sig: TAKE 1 TABLET BY MOUTH DAILY  WITH OR IMMEDIATELY FOLLOWING A  MEAL    Dispense:  90 tablet  Refill:  3    Please send a replace/new response with 100-Day Supply if appropriate to maximize member benefit.    spironolactone (ALDACTONE) 25 MG tablet    Sig: Take 1 tablet (25 mg total) by mouth daily.    Dispense:  90 tablet    Refill:  3   meclizine (ANTIVERT) 25 MG tablet    Sig: Take 1 tablet (25 mg total) by mouth 3 (three) times daily as needed for dizziness.    Dispense:  30 tablet    Refill:  0    Patient Instructions  Medication Instructions:  STOP Amlodipine  START Aldactone 25 mg daily   *If you need a refill on your cardiac medications before your next appointment, please call your pharmacy*  Follow-Up: At [facility], you and your health needs are our priority.  As part of our continuing mission to provide you with exceptional heart care, we have created designated Provider Care Teams.  These Care Teams include your primary Cardiologist (physician) and Advanced Practice Providers (APPs -  Physician Assistants and Nurse Practitioners) who all work together to provide you with the care you need, when you need it.  We recommend signing up for the patient portal called "MyChart".  Sign up information is provided on this After Visit Summary.  MyChart is used to connect with patients for Virtual Visits (Telemedicine).  Patients are able to view lab/test results, encounter notes, upcoming appointments, etc.  Non-urgent messages can be sent to your provider as well.   To learn more about what you can do with MyChart, go to [email or web address].    Your next appointment:   3 month(s)  Provider:   Marjie Skiff, PA-C, Azalee Course, PA-C, Bernadene Person, NP, or Carlos Levering, NP    Then, Reatha Harps, MD will plan to see you again in 12 month(s).       Time Spent with Patient: I have spent a total of 25 minutes with patient reviewing hospital notes, telemetry, EKGs, labs and examining the patient as well as establishing an assessment and plan that was discussed with the patient.  > 50% of time was spent in direct patient care.  Signed, Lenna Gilford. Flora Lipps, MD, [facility]  [facility]  [address] [place], [place] [address] [phone]  05/18/2023 9:08 AM

## 2023-05-18 ENCOUNTER — Encounter: Payer: Self-pay | Admitting: Cardiovascular Disease

## 2023-05-18 ENCOUNTER — Ambulatory Visit: Payer: Medicare Other | Attending: Cardiovascular Disease | Admitting: Cardiovascular Disease

## 2023-05-18 VITALS — BP 187/82 | HR 66 | Ht 68.0 in | Wt 225.8 lb

## 2023-05-18 DIAGNOSIS — D6869 Other thrombophilia: Secondary | ICD-10-CM

## 2023-05-18 DIAGNOSIS — R42 Dizziness and giddiness: Secondary | ICD-10-CM | POA: Diagnosis not present

## 2023-05-18 DIAGNOSIS — I4819 Other persistent atrial fibrillation: Secondary | ICD-10-CM | POA: Diagnosis not present

## 2023-05-18 DIAGNOSIS — E782 Mixed hyperlipidemia: Secondary | ICD-10-CM | POA: Diagnosis not present

## 2023-05-18 DIAGNOSIS — I10 Essential (primary) hypertension: Secondary | ICD-10-CM

## 2023-05-18 MED ORDER — MECLIZINE HCL 25 MG PO TABS: 25.0000 mg | ORAL_TABLET | Freq: Three times a day (TID) | ORAL | 0 refills | Status: AC | PRN

## 2023-05-18 MED ORDER — SPIRONOLACTONE 25 MG PO TABS: 25.0000 mg | ORAL_TABLET | Freq: Every day | ORAL | 3 refills | Status: DC

## 2023-05-18 MED ORDER — METOPROLOL SUCCINATE ER 50 MG PO TB24: ORAL_TABLET | ORAL | 3 refills | Status: DC

## 2023-05-18 MED ORDER — APIXABAN 5 MG PO TABS: 5.0000 mg | ORAL_TABLET | Freq: Two times a day (BID) | ORAL | 3 refills | Status: DC

## 2023-05-18 NOTE — Patient Instructions (Signed)
Medication Instructions:  STOP Amlodipine  START Aldactone 25 mg daily   *If you need a refill on your cardiac medications before your next appointment, please call your pharmacy*  Follow-Up: At [facility], you and your health needs are our priority.  As part of our continuing mission to provide you with exceptional heart care, we have created designated Provider Care Teams.  These Care Teams include your primary Cardiologist (physician) and Advanced Practice Providers (APPs -  Physician Assistants and Nurse Practitioners) who all work together to provide you with the care you need, when you need it.  We recommend signing up for the patient portal called "MyChart".  Sign up information is provided on this After Visit Summary.  MyChart is used to connect with patients for Virtual Visits (Telemedicine).  Patients are able to view lab/test results, encounter notes, upcoming appointments, etc.  Non-urgent messages can be sent to your provider as well.   To learn more about what you can do with MyChart, go to [email or web address].    Your next appointment:   3 month(s)  Provider:   Marjie Skiff, PA-C, Azalee Course, PA-C, Bernadene Person, NP, or Carlos Levering, NP    Then, Reatha Harps, MD will plan to see you again in 12 month(s).

## 2023-05-22 ENCOUNTER — Other Ambulatory Visit: Payer: Self-pay | Admitting: Family Medicine

## 2023-05-22 DIAGNOSIS — K2950 Unspecified chronic gastritis without bleeding: Secondary | ICD-10-CM

## 2023-05-22 DIAGNOSIS — I4819 Other persistent atrial fibrillation: Secondary | ICD-10-CM

## 2023-05-22 DIAGNOSIS — I10 Essential (primary) hypertension: Secondary | ICD-10-CM

## 2023-05-22 DIAGNOSIS — E1169 Type 2 diabetes mellitus with other specified complication: Secondary | ICD-10-CM

## 2023-06-05 ENCOUNTER — Encounter: Payer: Self-pay | Admitting: Family Medicine

## 2023-06-05 ENCOUNTER — Ambulatory Visit (INDEPENDENT_AMBULATORY_CARE_PROVIDER_SITE_OTHER): Payer: Medicare Other | Admitting: Family Medicine

## 2023-06-05 ENCOUNTER — Other Ambulatory Visit: Payer: Medicare Other

## 2023-06-05 VITALS — BP 156/77 | HR 41 | Ht 68.0 in | Wt 226.0 lb

## 2023-06-05 DIAGNOSIS — Z7984 Long term (current) use of oral hypoglycemic drugs: Secondary | ICD-10-CM

## 2023-06-05 DIAGNOSIS — E785 Hyperlipidemia, unspecified: Secondary | ICD-10-CM | POA: Diagnosis not present

## 2023-06-05 DIAGNOSIS — E1159 Type 2 diabetes mellitus with other circulatory complications: Secondary | ICD-10-CM | POA: Diagnosis not present

## 2023-06-05 DIAGNOSIS — I152 Hypertension secondary to endocrine disorders: Secondary | ICD-10-CM | POA: Diagnosis not present

## 2023-06-05 DIAGNOSIS — I10 Essential (primary) hypertension: Secondary | ICD-10-CM

## 2023-06-05 DIAGNOSIS — E1169 Type 2 diabetes mellitus with other specified complication: Secondary | ICD-10-CM

## 2023-06-05 LAB — CBC WITH DIFFERENTIAL/PLATELET
Basophils Absolute: 0.1 10*3/uL (ref 0.0–0.2)
Basos: 1 %
EOS (ABSOLUTE): 0.2 10*3/uL (ref 0.0–0.4)
Eos: 2 %
Hematocrit: 45.7 % (ref 37.5–51.0)
Hemoglobin: 15.5 g/dL (ref 13.0–17.7)
Immature Grans (Abs): 0 10*3/uL (ref 0.0–0.1)
Immature Granulocytes: 0 %
Lymphocytes Absolute: 1.7 10*3/uL (ref 0.7–3.1)
Lymphs: 22 %
MCH: 29.8 pg (ref 26.6–33.0)
MCHC: 33.9 g/dL (ref 31.5–35.7)
MCV: 88 fL (ref 79–97)
Monocytes Absolute: 0.7 10*3/uL (ref 0.1–0.9)
Monocytes: 9 %
Neutrophils Absolute: 5 10*3/uL (ref 1.4–7.0)
Neutrophils: 66 %
Platelets: 143 10*3/uL — ABNORMAL LOW (ref 150–450)
RBC: 5.2 x10E6/uL (ref 4.14–5.80)
RDW: 13.2 % (ref 11.6–15.4)
WBC: 7.7 10*3/uL (ref 3.4–10.8)

## 2023-06-05 LAB — CMP14+EGFR
ALT: 10 IU/L (ref 0–44)
AST: 15 IU/L (ref 0–40)
Albumin/Globulin Ratio: 2.3 — ABNORMAL HIGH (ref 1.2–2.2)
Albumin: 4.1 g/dL (ref 3.8–4.8)
Alkaline Phosphatase: 84 IU/L (ref 44–121)
BUN/Creatinine Ratio: 20 (ref 10–24)
BUN: 16 mg/dL (ref 8–27)
Bilirubin Total: 1.5 mg/dL — ABNORMAL HIGH (ref 0.0–1.2)
CO2: 23 mmol/L (ref 20–29)
Calcium: 8.9 mg/dL (ref 8.6–10.2)
Chloride: 104 mmol/L (ref 96–106)
Creatinine, Ser: 0.79 mg/dL (ref 0.76–1.27)
Globulin, Total: 1.8 g/dL (ref 1.5–4.5)
Glucose: 120 mg/dL — ABNORMAL HIGH (ref 70–99)
Potassium: 4.1 mmol/L (ref 3.5–5.2)
Sodium: 142 mmol/L (ref 134–144)
Total Protein: 5.9 g/dL — ABNORMAL LOW (ref 6.0–8.5)
eGFR: 90 mL/min/{1.73_m2} (ref >59)

## 2023-06-05 LAB — LIPID PANEL
Chol/HDL Ratio: 2.1 ratio (ref 0.0–5.0)
Cholesterol, Total: 84 mg/dL — ABNORMAL LOW (ref 100–199)
HDL: 40 mg/dL (ref >39)
LDL Chol Calc (NIH): 28 mg/dL (ref 0–99)
Triglycerides: 74 mg/dL (ref 0–149)
VLDL Cholesterol Cal: 16 mg/dL (ref 5–40)

## 2023-06-05 LAB — BAYER DCA HB A1C WAIVED: HB A1C (BAYER DCA - WAIVED): 6.4 % — ABNORMAL HIGH (ref 4.8–5.6)

## 2023-06-05 MED ORDER — DAPAGLIFLOZIN PROPANEDIOL 10 MG PO TABS
10.0000 mg | ORAL_TABLET | Freq: Every day | ORAL | 5 refills | Status: DC
Start: 2023-06-05 — End: 2024-09-21

## 2023-06-05 NOTE — Progress Notes (Addendum)
BP (!) 156/77   Pulse (!) 41   Ht 5\' 8"  (1.727 m)   Wt 226 lb (102.5 kg)   SpO2 96%   BMI 34.36 kg/m    Subjective:   Patient ID: Vincent Collins, male    DOB: [birth year], 82 y.o.   MRN: [id number]  HPI: CHRITOPHER Collins is a 82 y.o. male presenting on 06/05/2023 for Medical Management of Chronic Issues, Diabetes, and Hypertension   HPI Type 2 diabetes mellitus Patient comes in today for recheck of his diabetes. Patient has been currently taking metformin. Patient is not currently on an ACE inhibitor/ARB. Patient has not seen an ophthalmologist this year. Patient denies any new issues with their feet. The symptom started onset as an adult hypertension and hyperlipidemia ARE RELATED TO DM   Hypertension Patient is currently on lisinopril metoprolol spironolactone, and their blood pressure today is 156/77. Patient denies any lightheadedness or dizziness. Patient denies headaches, blurred vision, chest pains, shortness of breath, or weakness. Denies any side effects from medication and is content with current medication.   Hyperlipidemia Patient is coming in for recheck of his hyperlipidemia. The patient is currently taking Lipitor. They deny any issues with myalgias or history of liver damage from it. They deny any focal numbness or weakness or chest pain.   Relevant past medical, surgical, family and social history reviewed and updated as indicated. Interim medical history since our last visit reviewed. Allergies and medications reviewed and updated.  Review of Systems  Constitutional:  Negative for chills and fever.  Eyes:  Negative for visual disturbance.  Respiratory:  Negative for shortness of breath and wheezing.   Cardiovascular:  Negative for chest pain and leg swelling.  Musculoskeletal:  Negative for back pain and gait problem.  Skin:  Negative for rash.  Neurological:  Negative for dizziness, weakness and light-headedness.  All other systems reviewed and are  negative.   Per HPI unless specifically indicated above   Allergies as of 06/05/2023   No Known Allergies      Medication List        Accurate as of June 05, 2023 11:27 AM. If you have any questions, ask your nurse or doctor.          Accu-Chek Guide test strip Generic drug: glucose blood TEST BLOOD SUGAR DAILY; DX: E11.9.   Accu-Chek Guide w/Device Kit TEST BLOOD SUGAR DAILY; DX: E11.9.   acetaminophen 325 MG tablet Commonly known as: TYLENOL Take 2 tablets (650 mg total) by mouth every 6 (six) hours as needed for mild pain (or Fever >/= 101).   albuterol (2.5 MG/3ML) 0.083% nebulizer solution Commonly known as: PROVENTIL USE 1 VIAL VIA NEBULIZER EVERY 6 HOURS AS NEEDED FOR WHEEZING OR  SHORTNESS OF BREATH   apixaban 5 MG Tabs tablet Commonly known as: Eliquis Take 1 tablet (5 mg total) by mouth 2 (two) times daily.   atorvastatin 40 MG tablet Commonly known as: LIPITOR TAKE 1 TABLET BY MOUTH DAILY   CO-Q 10 Omega-3 Fish Oil Caps Take 1 capsule by mouth in the morning. 100mg  Capsule Qunol   dapagliflozin propanediol 10 MG Tabs tablet Commonly known as: Farxiga Take 1 tablet (10 mg total) by mouth daily before breakfast. Started by: [name], MD   fluticasone 50 MCG/ACT nasal spray Commonly known as: FLONASE USE 1 SPRAY IN BOTH  NOSTRILS ONCE DAILY   guaiFENesin 600 MG 12 hr tablet Commonly known as: Mucinex Take 1 tablet (600 mg total) by  mouth 2 (two) times daily.   lisinopril 40 MG tablet Commonly known as: ZESTRIL Take 1 tablet by mouth once daily   meclizine 25 MG tablet Commonly known as: ANTIVERT Take 1 tablet (25 mg total) by mouth 3 (three) times daily as needed for dizziness.   metFORMIN 500 MG 24 hr tablet Commonly known as: GLUCOPHAGE-XR TAKE 1 TABLET BY MOUTH TWICE  DAILY   metoprolol succinate 50 MG 24 hr tablet Commonly known as: TOPROL-XL TAKE 1 TABLET BY MOUTH DAILY  WITH OR IMMEDIATELY FOLLOWING A  MEAL   Microlet  Lancets Misc USE TO TEST BLOOD SUGAR DAILY; DX: E11.9. PATIENT HAS GLUCOMETER AND LANCING DEVICE   omeprazole 20 MG capsule Commonly known as: PRILOSEC TAKE 1 CAPSULE BY MOUTH DAILY   spironolactone 25 MG tablet Commonly known as: ALDACTONE Take 1 tablet (25 mg total) by mouth daily.   tamsulosin 0.4 MG Caps capsule Commonly known as: FLOMAX TAKE 2 CAPSULES BY MOUTH DAILY  AFTER SUPPER   Vitamin D 1000 units capsule Take 1,000 Units by mouth in the morning.         Objective:   BP (!) 156/77   Pulse (!) 41   Ht 5\' 8"  (1.727 m)   Wt 226 lb (102.5 kg)   SpO2 96%   BMI 34.36 kg/m   Wt Readings from Last 3 Encounters:  06/05/23 226 lb (102.5 kg)  05/18/23 225 lb 12.8 oz (102.4 kg)  03/05/23 226 lb (102.5 kg)    Physical Exam Vitals and nursing note reviewed.  Constitutional:      General: He is not in acute distress.    Appearance: He is well-developed. He is not diaphoretic.  Eyes:     General: No scleral icterus.    Conjunctiva/sclera: Conjunctivae normal.  Neck:     Thyroid: No thyromegaly.  Cardiovascular:     Rate and Rhythm: Normal rate and regular rhythm.     Heart sounds: Normal heart sounds. No murmur heard. Pulmonary:     Effort: Pulmonary effort is normal. No respiratory distress.     Breath sounds: Normal breath sounds. No wheezing.  Musculoskeletal:        General: No swelling. Normal range of motion.     Cervical back: Neck supple.  Lymphadenopathy:     Cervical: No cervical adenopathy.  Skin:    General: Skin is warm and dry.     Findings: No rash.  Neurological:     Mental Status: He is alert and oriented to person, place, and time.     Coordination: Coordination normal.  Psychiatric:        Behavior: Behavior normal.       Assessment & Plan:   Problem List Items Addressed This Visit       Cardiovascular and Mediastinum   Essential hypertension   Relevant Orders   CBC with Differential/Platelet   CMP14+EGFR   Lipid panel    Bayer DCA Hb A1c Waived   AMB Referral to Pharmacy Medication Management     Endocrine   Hyperlipidemia associated with type 2 diabetes mellitus (HCC)   Relevant Medications   dapagliflozin propanediol (FARXIGA) 10 MG TABS tablet   Other Relevant Orders   CBC with Differential/Platelet   CMP14+EGFR   Lipid panel   Bayer DCA Hb A1c Waived   Type 2 diabetes mellitus (HCC) - Primary   Relevant Medications   dapagliflozin propanediol (FARXIGA) 10 MG TABS tablet   Other Relevant Orders   CBC with Differential/Platelet  CMP14+EGFR   Lipid panel   Bayer DCA Hb A1c Waived   AMB Referral to Pharmacy Medication Management  A1c 6.4 which is up from baseline but still looks decent.  Will add Farxiga to see if that helps somewhat the blood pressure and heart status.  Does not appear that the spironolactone has been a difference in the past week.  His blood pressure still running slightly elevated at 156 here and between 140 and 146 at home.  Gave 2 weeks samples of Farxiga 10 mg Follow up plan: Return in about 3 months (around 09/05/2023), or if symptoms worsen or fail to improve, for Diabetes recheck.  Counseling provided for all of the vaccine components Orders Placed This Encounter  Procedures   CBC with Differential/Platelet   CMP14+EGFR   Lipid panel   Bayer DCA Hb A1c Waived   AMB Referral to Pharmacy Medication Management    Arville Care, MD [place] [facility] 06/05/2023, 11:27 AM

## 2023-06-09 ENCOUNTER — Telehealth: Payer: Self-pay

## 2023-06-09 NOTE — Progress Notes (Signed)
   Care Guide Note  06/09/2023 Name: [name] MRN: [id number] DOB: 1941/08/29  Referred by: Dettinger, Elige Radon, MD Reason for referral : Care Coordination (Outreach to schedule with Pharm d )   [name] is a 82 y.o. year old male who is a primary care patient of Dettinger, Elige Radon, MD. [name] was referred to the pharmacist for assistance related to DM.    Successful contact was made with the patient to discuss pharmacy services including being ready for the pharmacist to call at least 5 minutes before the scheduled appointment time, to have medication bottles and any blood sugar or blood pressure readings ready for review. The patient agreed to meet with the pharmacist via with the pharmacist via telephone visit on (date/time).  06/12/2023  Penne Lash, RMA Care Guide [facility]  [place], [place] [address] Direct Dial: [phone] [email or web address]

## 2023-06-10 ENCOUNTER — Ambulatory Visit (INDEPENDENT_AMBULATORY_CARE_PROVIDER_SITE_OTHER): Payer: Medicare Other

## 2023-06-10 VITALS — Ht 68.0 in | Wt 226.0 lb

## 2023-06-10 DIAGNOSIS — Z0000 Encounter for general adult medical examination without abnormal findings: Secondary | ICD-10-CM | POA: Diagnosis not present

## 2023-06-10 NOTE — Progress Notes (Signed)
Subjective:   Vincent Collins is a 82 y.o. male who presents for Medicare Annual/Subsequent preventive examination. I connected with  Vincent Collins on 06/10/23 by a audio enabled telemedicine application and verified that I am speaking with the correct person using two identifiers.  Patient Location: Home  Provider Location: Home Office  I discussed the limitations of evaluation and management by telemedicine. The patient expressed understanding and agreed to proceed.  Review of Systems     Cardiac Risk Factors include: advanced age (>49men, >22 women);diabetes mellitus;hypertension;male gender;dyslipidemia     Objective:    Today's Vitals   06/10/23 1412  Weight: 226 lb (102.5 kg)  Height: 5\' 8"  (1.727 m)   Body mass index is 34.36 kg/m.     06/10/2023    2:16 PM 05/22/2022    9:55 AM 05/21/2021    9:00 AM 08/03/2020    8:13 AM 07/31/2020    3:21 AM 04/16/2020    8:51 AM 03/26/2020   10:21 AM  Advanced Directives  Does Patient Have a Medical Advance Directive? No No No No No No No  Would patient like information on creating a medical advance directive? No - Patient declined No - Patient declined Yes (MAU/Ambulatory/Procedural Areas - Information given) No - Patient declined No - Patient declined No - Patient declined No - Patient declined    Current Medications (verified) Outpatient Encounter Medications as of 06/10/2023  Medication Sig   acetaminophen (TYLENOL) 325 MG tablet Take 2 tablets (650 mg total) by mouth every 6 (six) hours as needed for mild pain (or Fever >/= 101).   albuterol (PROVENTIL) (2.5 MG/3ML) 0.083% nebulizer solution USE 1 VIAL VIA NEBULIZER EVERY 6 HOURS AS NEEDED FOR WHEEZING OR  SHORTNESS OF BREATH   apixaban (ELIQUIS) 5 MG TABS tablet Take 1 tablet (5 mg total) by mouth 2 (two) times daily.   atorvastatin (LIPITOR) 40 MG tablet TAKE 1 TABLET BY MOUTH DAILY   Blood Glucose Monitoring Suppl (ACCU-CHEK GUIDE) w/Device KIT TEST BLOOD SUGAR DAILY; DX: E11.9.    Cholecalciferol (VITAMIN D) 1000 UNITS capsule Take 1,000 Units by mouth in the morning.   Coenzyme Q10-Fish Oil-Vit E (CO-Q 10 OMEGA-3 FISH OIL) CAPS Take 1 capsule by mouth in the morning. 100mg  Capsule Qunol   dapagliflozin propanediol (FARXIGA) 10 MG TABS tablet Take 1 tablet (10 mg total) by mouth daily before breakfast.   fluticasone (FLONASE) 50 MCG/ACT nasal spray USE 1 SPRAY IN BOTH  NOSTRILS ONCE DAILY   glucose blood (ACCU-CHEK GUIDE) test strip TEST BLOOD SUGAR DAILY; DX: E11.9.   lisinopril (ZESTRIL) 40 MG tablet Take 1 tablet by mouth once daily   meclizine (ANTIVERT) 25 MG tablet Take 1 tablet (25 mg total) by mouth 3 (three) times daily as needed for dizziness.   metFORMIN (GLUCOPHAGE-XR) 500 MG 24 hr tablet TAKE 1 TABLET BY MOUTH TWICE  DAILY   metoprolol succinate (TOPROL-XL) 50 MG 24 hr tablet TAKE 1 TABLET BY MOUTH DAILY  WITH OR IMMEDIATELY FOLLOWING A  MEAL   Microlet Lancets MISC USE TO TEST BLOOD SUGAR DAILY; DX: E11.9. PATIENT HAS GLUCOMETER AND LANCING DEVICE   omeprazole (PRILOSEC) 20 MG capsule TAKE 1 CAPSULE BY MOUTH DAILY   spironolactone (ALDACTONE) 25 MG tablet Take 1 tablet (25 mg total) by mouth daily.   tamsulosin (FLOMAX) 0.4 MG CAPS capsule TAKE 2 CAPSULES BY MOUTH DAILY  AFTER SUPPER   guaiFENesin (MUCINEX) 600 MG 12 hr tablet Take 1 tablet (600 mg total) by  mouth 2 (two) times daily. (Patient not taking: Reported on 06/10/2023)   No facility-administered encounter medications on file as of 06/10/2023.    Allergies (verified) Patient has no known allergies.   History: Past Medical History:  Diagnosis Date   Arrhythmia    Broken leg and ribs, right, closed, initial encounter    COPD (chronic obstructive pulmonary disease) (HCC)    Diabetes mellitus without complication (HCC)    Diverticulitis    GERD (gastroesophageal reflux disease)    Hydronephrosis of left kidney    Hyperlipidemia    Hypertension    Left ureteral stone    obstructing   Renal  calculus or stone 2015   Past Surgical History:  Procedure Laterality Date   2d echo  04/04/09   CHOLECYSTECTOMY N/A 08/03/2020   Procedure: LAPAROSCOPIC CHOLECYSTECTOMY WITH INTRAOPERATIVE CHOLANGIOGRAM;  Surgeon: Gaynelle Adu, MD;  Location: [facility] OR;  Service: General;  Laterality: N/A;   CYSTOSCOPY WITH RETROGRADE PYELOGRAM, URETEROSCOPY AND STENT PLACEMENT Left 10/11/2014   Procedure: CYSTOSCOPY WITH RETROGRADE PYELOGRAM, DIAGNOSTIC URETEROSCOPY AND STENT PLACEMENT;  Surgeon: [name], MD;  Location: [facility];  Service: Urology;  Laterality: Left;   LUMBAR DISC SURGERY     SPINE SURGERY     Family History  Problem Relation Age of Onset   Early death Father        MI age 39   Heart attack Father    COPD Brother    Cancer Sister    Social History   Socioeconomic History   Marital status: Married    Spouse name: Harriett Sine   Number of children: 2   Years of education: 8th   Highest education level: 8th grade  Occupational History   Occupation: [employer]    Comment: part time 6 hour shifts  Tobacco Use   Smoking status: Former    Types: Cigarettes    Quit date: 06/18/1967    Years since quitting: 56.0   Smokeless tobacco: Former    Types: Associate Professor Use: Never used  Substance and Sexual Activity   Alcohol use: No   Drug use: No   Sexual activity: Not Currently  Other Topics Concern   Not on file  Social History Narrative   Lives home with wife - still working part time   Social Determinants of Health   Financial Resource Strain: Low Risk  (06/10/2023)   Overall Financial Resource Strain (CARDIA)    Difficulty of Paying Living Expenses: Not hard at all  Food Insecurity: No Food Insecurity (06/10/2023)   Hunger Vital Sign    Worried About Running Out of Food in the Last Year: Never true    Ran Out of Food in the Last Year: Never true  Transportation Needs: No Transportation Needs (06/10/2023)   PRAPARE - Therapist, art (Medical): No    Lack of Transportation (Non-Medical): No  Physical Activity: Insufficiently Active (06/10/2023)   Exercise Vital Sign    Days of Exercise per Week: 3 days    Minutes of Exercise per Session: 30 min  Stress: No Stress Concern Present (06/10/2023)   [employer] of Occupational Health - Occupational Stress Questionnaire    Feeling of Stress : Not at all  Social Connections: Moderately Integrated (06/10/2023)   Social Connection and Isolation Panel [NHANES]    Frequency of Communication with Friends and Family: More than three times a week    Frequency of Social Gatherings  with Friends and Family: More than three times a week    Attends Religious Services: More than 4 times per year    Active Member of Clubs or Organizations: No    Attends Engineer, structural: Never    Marital Status: Married    Tobacco Counseling Counseling given: Not Answered   Clinical Intake:  Pre-visit preparation completed: Yes  Pain : No/denies pain     Nutritional Risks: None Diabetes: Yes CBG done?: No Did pt. bring in CBG monitor from home?: No  How often do you need to have someone help you when you read instructions, pamphlets, or other written materials from your doctor or pharmacy?: 1 - Never  Diabetic?yes  Nutrition Risk Assessment:  Has the patient had any N/V/D within the last 2 months?  No  Does the patient have any non-healing wounds?  No  Has the patient had any unintentional weight loss or weight gain?  No   Diabetes:  Is the patient diabetic?  Yes  If diabetic, was a CBG obtained today?  No  Did the patient bring in their glucometer from home?  No  How often do you monitor your CBG's? Never .   Financial Strains and Diabetes Management:  Are you having any financial strains with the device, your supplies or your medication? No .  Does the patient want to be seen by Chronic Care Management for management of their  diabetes?  No  Would the patient like to be referred to a Nutritionist or for Diabetic Management?  No   Diabetic Exams:  Diabetic Eye Exam: Overdue for diabetic eye exam. Pt has been advised about the importance in completing this exam. Patient advised to call and schedule an eye exam. Diabetic Foot Exam: Overdue, Pt has been advised about the importance in completing this exam. Pt is scheduled for diabetic foot exam on next office visit .   Interpreter Needed?: No  Information entered by :: Renie Ora, LPN   Activities of Daily Living    06/10/2023    2:17 PM  In your present state of health, do you have any difficulty performing the following activities:  Hearing? 0  Vision? 0  Difficulty concentrating or making decisions? 0  Walking or climbing stairs? 0  Dressing or bathing? 0  Doing errands, shopping? 0  Preparing Food and eating ? N  Using the Toilet? N  In the past six months, have you accidently leaked urine? N  Do you have problems with loss of bowel control? N  Managing your Medications? N  Managing your Finances? N  Housekeeping or managing your Housekeeping? N    Patient Care Team: Dettinger, Elige Radon, MD as PCP - General (Family Medicine) O'Neal, Ronnald Ramp, MD as PCP - Cardiology (Cardiology) O'Neal, Ronnald Ramp, MD as Consulting Physician (Cardiology) Michaelle Copas, MD as Referring Physician (Optometry)  Indicate any recent Medical Services you may have received from other than [facility] providers in the past year (date may be approximate).     Assessment:   This is a routine wellness examination for Amorie.  Hearing/Vision screen Vision Screening - Comments:: Wears rx glasses - Patient will schedule with routine eye exams with  Dr.Lee   Dietary issues and exercise activities discussed: Current Exercise Habits: Home exercise routine, Type of exercise: walking, Time (Minutes): 30, Frequency (Times/Week): 3, Weekly Exercise (Minutes/Week): 90,  Intensity: Mild, Exercise limited by: orthopedic condition(s)   Goals Addressed  This Visit's Progress    DIET - EAT MORE FRUITS AND VEGETABLES   On track      Depression Screen    06/10/2023    2:15 PM 06/05/2023   11:12 AM 01/05/2023    2:39 PM 11/28/2022    3:28 PM 11/18/2022   12:03 PM 09/08/2022   11:14 AM 05/22/2022    9:54 AM  PHQ 2/9 Scores  PHQ - 2 Score 0  0  0 0 0  PHQ- 9 Score   0  0    Exception Documentation  Patient refusal  Patient refusal       Fall Risk    06/10/2023    2:14 PM 06/05/2023   11:12 AM 09/08/2022   11:14 AM 05/22/2022    9:49 AM 05/07/2022    9:20 AM  Fall Risk   Falls in the past year? 0 0 0 0 0  Number falls in past yr: 0   0   Injury with Fall? 0   0   Risk for fall due to : No Fall Risks   Orthopedic patient   Follow up Falls prevention discussed   Falls prevention discussed     FALL RISK PREVENTION PERTAINING TO THE HOME:  Any stairs in or around the home? No  If so, are there any without handrails? No  Home free of loose throw rugs in walkways, pet beds, electrical cords, etc? Yes  Adequate lighting in your home to reduce risk of falls? Yes   ASSISTIVE DEVICES UTILIZED TO PREVENT FALLS:  Life alert? No  Use of a cane, walker or w/c? No  Grab bars in the bathroom? No  Shower chair or bench in shower? No  Elevated toilet seat or a handicapped toilet? No       06/17/2017   10:21 AM  MMSE - Mini Mental State Exam  Not completed: Unable to complete  Orientation to time 5  Orientation to Place 4  Registration 3  Attention/ Calculation 0  Attention/Calculation-comments unable to spell  Recall 3  Language- name 2 objects 2  Language- repeat 1  Language- follow 3 step command 3  Language- read & follow direction 0  Language-read & follow direction-comments difficulty with reading and spelling  Write a sentence 0  Write a sentence-comments difficulty with reading and spelling  Copy design 1  Total score 22         06/10/2023    2:17 PM 05/22/2022   10:04 AM 04/16/2020    8:44 AM  6CIT Screen  What Year? 0 points 0 points 0 points  What month? 0 points 0 points 0 points  What time? 0 points 0 points 0 points  Count back from 20 0 points 0 points 0 points  Months in reverse 0 points 4 points 0 points  Repeat phrase 0 points 6 points 4 points  Total Score 0 points 10 points 4 points    Immunizations Immunization History  Administered Date(s) Administered   DTaP 11/23/2007   Fluad Quad(high Dose 65+) 01/04/2020, 12/06/2020, 12/06/2021   Influenza Whole 09/26/2010   Influenza, High Dose Seasonal PF 10/07/2017, 01/04/2019   Influenza,inj,Quad PF,6+ Mos 10/06/2013, 10/02/2014, 10/19/2015, 11/26/2016   Moderna Sars-Covid-2 Vaccination 05/21/2020, 06/18/2020   Pneumococcal Conjugate-13 05/09/2015   Pneumococcal Polysaccharide-23 04/05/2012, 04/20/2012   Tdap 05/17/2009, 12/06/2020   Zoster Recombinat (Shingrix) 03/10/2022, 06/05/2022   Zoster, Live 05/17/2014    TDAP status: Up to date  Flu Vaccine status: Declined, Education has been provided  regarding the importance of this vaccine but patient still declined. Advised may receive this vaccine at local pharmacy or Health Dept. Aware to provide a copy of the vaccination record if obtained from local pharmacy or Health Dept. Verbalized acceptance and understanding.  Pneumococcal vaccine status: Up to date  Covid-19 vaccine status: Completed vaccines  Qualifies for Shingles Vaccine? Yes   Zostavax completed No   Shingrix Completed?: No.    Education has been provided regarding the importance of this vaccine. Patient has been advised to call insurance company to determine out of pocket expense if they have not yet received this vaccine. Advised may also receive vaccine at local pharmacy or Health Dept. Verbalized acceptance and understanding.  Screening Tests Health Maintenance  Topic Date Due   COVID-19 Vaccine (3 - 2023-24 season)  08/29/2022   OPHTHALMOLOGY EXAM  02/25/2023   INFLUENZA VACCINE  07/30/2023   FOOT EXAM  09/09/2023   Diabetic kidney evaluation - Urine ACR  11/29/2023   HEMOGLOBIN A1C  12/05/2023   Diabetic kidney evaluation - eGFR measurement  06/04/2024   Medicare Annual Wellness (AWV)  06/09/2024   DTaP/Tdap/Td (4 - Td or Tdap) 12/06/2030   Pneumonia Vaccine 65+ Years old  Completed   Zoster Vaccines- Shingrix  Completed   HPV VACCINES  Aged Out   Hepatitis C Screening  Discontinued    Health Maintenance  Health Maintenance Due  Topic Date Due   COVID-19 Vaccine (3 - 2023-24 season) 08/29/2022   OPHTHALMOLOGY EXAM  02/25/2023    Colorectal cancer screening: No longer required.   Lung Cancer Screening: (Low Dose CT Chest recommended if Age 48-80 years, 30 pack-year currently smoking OR have quit w/in 15years.) does not qualify.   Lung Cancer Screening Referral: n/a  Additional Screening:  Hepatitis C Screening: does not qualify;   Vision Screening: Recommended annual ophthalmology exams for early detection of glaucoma and other disorders of the eye. Is the patient up to date with their annual eye exam?  Yes  Who is the provider or what is the name of the office in which the patient attends annual eye exams? Dr.Lee  If pt is not established with a provider, would they like to be referred to a provider to establish care? No .   Dental Screening: Recommended annual dental exams for proper oral hygiene  Community Resource Referral / Chronic Care Management: CRR required this visit?  No   CCM required this visit?  No      Plan:     I have personally reviewed and noted the following in the patient's chart:   Medical and social history Use of alcohol, tobacco or illicit drugs  Current medications and supplements including opioid prescriptions. Patient is not currently taking opioid prescriptions. Functional ability and status Nutritional status Physical activity Advanced  directives List of other physicians Hospitalizations, surgeries, and ER visits in previous 12 months Vitals Screenings to include cognitive, depression, and falls Referrals and appointments  In addition, I have reviewed and discussed with patient certain preventive protocols, quality metrics, and best practice recommendations. A written personalized care plan for preventive services as well as general preventive health recommendations were provided to patient.     Lorrene Reid, LPN   [id number]   Nurse Notes: none

## 2023-06-10 NOTE — Patient Instructions (Signed)
Vincent Collins , Thank you for taking time to come for your Medicare Wellness Visit. I appreciate your ongoing commitment to your health goals. Please review the following plan we discussed and let me know if I can assist you in the future.   These are the goals we discussed:  Goals      DIET - EAT MORE FRUITS AND VEGETABLES     Exercise 150 minutes per week (moderate activity)     Try to walk for at least 30 minutes daily. Avoid the heat of the day.      Reduce portion size        This is a list of the screening recommended for you and due dates:  Health Maintenance  Topic Date Due   COVID-19 Vaccine (3 - 2023-24 season) 08/29/2022   Eye exam for diabetics  02/25/2023   Flu Shot  07/30/2023   Complete foot exam   09/09/2023   Yearly kidney health urinalysis for diabetes  11/29/2023   Hemoglobin A1C  12/05/2023   Yearly kidney function blood test for diabetes  06/04/2024   Medicare Annual Wellness Visit  06/09/2024   DTaP/Tdap/Td vaccine (4 - Td or Tdap) 12/06/2030   Pneumonia Vaccine  Completed   Zoster (Shingles) Vaccine  Completed   HPV Vaccine  Aged Out   Hepatitis C Screening  Discontinued    Advanced directives: Advance directive discussed with you today. I have provided a copy for you to complete at home and have notarized. Once this is complete please bring a copy in to our office so we can scan it into your chart.   Conditions/risks identified: Aim for 30 minutes of exercise or brisk walking, 6-8 glasses of water, and 5 servings of fruits and vegetables each day.   Next appointment: Follow up in one year for your annual wellness visit.   Preventive Care 23 Years and Older, Male  Preventive care refers to lifestyle choices and visits with your health care provider that can promote health and wellness. What does preventive care include? A yearly physical exam. This is also called an annual well check. Dental exams once or twice a year. Routine eye exams. Ask your health  care provider how often you should have your eyes checked. Personal lifestyle choices, including: Daily care of your teeth and gums. Regular physical activity. Eating a healthy diet. Avoiding tobacco and drug use. Limiting alcohol use. Practicing safe sex. Taking low doses of aspirin every day. Taking vitamin and mineral supplements as recommended by your health care provider. What happens during an annual well check? The services and screenings done by your health care provider during your annual well check will depend on your age, overall health, lifestyle risk factors, and family history of disease. Counseling  Your health care provider may ask you questions about your: Alcohol use. Tobacco use. Drug use. Emotional well-being. Home and relationship well-being. Sexual activity. Eating habits. History of falls. Memory and ability to understand (cognition). Work and work Astronomer. Screening  You may have the following tests or measurements: Height, weight, and BMI. Blood pressure. Lipid and cholesterol levels. These may be checked every 5 years, or more frequently if you are over 73 years old. Skin check. Lung cancer screening. You may have this screening every year starting at age 60 if you have a 30-pack-year history of smoking and currently smoke or have quit within the past 15 years. Fecal occult blood test (FOBT) of the stool. You may have this test every  year starting at age 55. Flexible sigmoidoscopy or colonoscopy. You may have a sigmoidoscopy every 5 years or a colonoscopy every 10 years starting at age 29. Prostate cancer screening. Recommendations will vary depending on your family history and other risks. Hepatitis C blood test. Hepatitis B blood test. Sexually transmitted disease (STD) testing. Diabetes screening. This is done by checking your blood sugar (glucose) after you have not eaten for a while (fasting). You may have this done every 1-3 years. Abdominal  aortic aneurysm (AAA) screening. You may need this if you are a current or former smoker. Osteoporosis. You may be screened starting at age 50 if you are at high risk. Talk with your health care provider about your test results, treatment options, and if necessary, the need for more tests. Vaccines  Your health care provider may recommend certain vaccines, such as: Influenza vaccine. This is recommended every year. Tetanus, diphtheria, and acellular pertussis (Tdap, Td) vaccine. You may need a Td booster every 10 years. Zoster vaccine. You may need this after age 13. Pneumococcal 13-valent conjugate (PCV13) vaccine. One dose is recommended after age 67. Pneumococcal polysaccharide (PPSV23) vaccine. One dose is recommended after age 51. Talk to your health care provider about which screenings and vaccines you need and how often you need them. This information is not intended to replace advice given to you by your health care provider. Make sure you discuss any questions you have with your health care provider. Document Released: 01/11/2016 Document Revised: 09/03/2016 Document Reviewed: 10/16/2015 Elsevier Interactive Patient Education  2017 [employer].  Fall Prevention in the Home Falls can cause injuries. They can happen to people of all ages. There are many things you can do to make your home safe and to help prevent falls. What can I do on the outside of my home? Regularly fix the edges of walkways and driveways and fix any cracks. Remove anything that might make you trip as you walk through a door, such as a raised step or threshold. Trim any bushes or trees on the path to your home. Use bright outdoor lighting. Clear any walking paths of anything that might make someone trip, such as rocks or tools. Regularly check to see if handrails are loose or broken. Make sure that both sides of any steps have handrails. Any raised decks and porches should have guardrails on the edges. Have any  leaves, snow, or ice cleared regularly. Use sand or salt on walking paths during winter. Clean up any spills in your garage right away. This includes oil or grease spills. What can I do in the bathroom? Use night lights. Install grab bars by the toilet and in the tub and shower. Do not use towel bars as grab bars. Use non-skid mats or decals in the tub or shower. If you need to sit down in the shower, use a plastic, non-slip stool. Keep the floor dry. Clean up any water that spills on the floor as soon as it happens. Remove soap buildup in the tub or shower regularly. Attach bath mats securely with double-sided non-slip rug tape. Do not have throw rugs and other things on the floor that can make you trip. What can I do in the bedroom? Use night lights. Make sure that you have a light by your bed that is easy to reach. Do not use any sheets or blankets that are too big for your bed. They should not hang down onto the floor. Have a firm chair that has side  arms. You can use this for support while you get dressed. Do not have throw rugs and other things on the floor that can make you trip. What can I do in the kitchen? Clean up any spills right away. Avoid walking on wet floors. Keep items that you use a lot in easy-to-reach places. If you need to reach something above you, use a strong step stool that has a grab bar. Keep electrical cords out of the way. Do not use floor polish or wax that makes floors slippery. If you must use wax, use non-skid floor wax. Do not have throw rugs and other things on the floor that can make you trip. What can I do with my stairs? Do not leave any items on the stairs. Make sure that there are handrails on both sides of the stairs and use them. Fix handrails that are broken or loose. Make sure that handrails are as long as the stairways. Check any carpeting to make sure that it is firmly attached to the stairs. Fix any carpet that is loose or worn. Avoid  having throw rugs at the top or bottom of the stairs. If you do have throw rugs, attach them to the floor with carpet tape. Make sure that you have a light switch at the top of the stairs and the bottom of the stairs. If you do not have them, ask someone to add them for you. What else can I do to help prevent falls? Wear shoes that: Do not have high heels. Have rubber bottoms. Are comfortable and fit you well. Are closed at the toe. Do not wear sandals. If you use a stepladder: Make sure that it is fully opened. Do not climb a closed stepladder. Make sure that both sides of the stepladder are locked into place. Ask someone to hold it for you, if possible. Clearly mark and make sure that you can see: Any grab bars or handrails. First and last steps. Where the edge of each step is. Use tools that help you move around (mobility aids) if they are needed. These include: Canes. Walkers. Scooters. Crutches. Turn on the lights when you go into a dark area. Replace any light bulbs as soon as they burn out. Set up your furniture so you have a clear path. Avoid moving your furniture around. If any of your floors are uneven, fix them. If there are any pets around you, be aware of where they are. Review your medicines with your doctor. Some medicines can make you feel dizzy. This can increase your chance of falling. Ask your doctor what other things that you can do to help prevent falls. This information is not intended to replace advice given to you by your health care provider. Make sure you discuss any questions you have with your health care provider. Document Released: 10/11/2009 Document Revised: 05/22/2016 Document Reviewed: 01/19/2015 Elsevier Interactive Patient Education  2017 [employer].

## 2023-06-12 ENCOUNTER — Ambulatory Visit (INDEPENDENT_AMBULATORY_CARE_PROVIDER_SITE_OTHER): Payer: Medicare Other | Admitting: Pharmacist

## 2023-06-12 VITALS — BP 132/71 | HR 78

## 2023-06-12 DIAGNOSIS — I10 Essential (primary) hypertension: Secondary | ICD-10-CM

## 2023-06-12 NOTE — Progress Notes (Signed)
06/12/2023 Name: Vincent Collins MRN: [id number] DOB: December 27, 1941  HYPERTENSION   Vincent Collins is a 82 y.o. year old male who presented for a telephone visit.   They were referred to the pharmacist by their PCP for assistance in managing hypertension.   Subjective:  Care Team: Primary Care Provider: Dettinger, Elige Radon, MD  Cardiologist: [facility]; Next Scheduled Visit: 07/2023  Medication Access/Adherence  Current Pharmacy:  [facility] [address] [place] [id number] [place] [address] Phone: [phone] Fax: [phone]  Hypertension:  Current medications: LISINOPRIL, SPIRO, METOPROLOL Medications previously tried:   Patient has a validated, automated, upper arm home BP cuff Current blood pressure readings readings: 132/71 TODAY  Patient denies hypotensive s/sx including dizziness, lightheadedness.  Patient denies hypertensive symptoms including headache, chest pain, shortness of breath  Current physical activity: ENCOURAGED AS ABLE   Objective:  Lab Results  Component Value Date   HGBA1C 6.4 (H) 06/05/2023    Lab Results  Component Value Date   CREATININE 0.79 06/05/2023   BUN 16 06/05/2023   NA 142 06/05/2023   K 4.1 06/05/2023   CL 104 06/05/2023   CO2 23 06/05/2023    Lab Results  Component Value Date   CHOL 84 (L) 06/05/2023   HDL 40 06/05/2023   LDLCALC 28 06/05/2023   TRIG 74 06/05/2023   CHOLHDL 2.1 06/05/2023    Medications Reviewed Today     Reviewed by Lorrene Reid, LPN (Licensed Practical Nurse) on 06/10/23 at 1414  Med List Status: <None>   Medication Order Taking? Sig Documenting Provider Last Dose Status Informant  acetaminophen (TYLENOL) 325 MG tablet [id number] Yes Take 2 tablets (650 mg total) by mouth every 6 (six) hours as needed for mild pain (or Fever >/= 101). Meuth, Brooke A, PA-C Taking Active   albuterol (PROVENTIL) (2.5 MG/3ML) 0.083% nebulizer solution [id number] Yes USE 1 VIAL VIA  NEBULIZER EVERY 6 HOURS AS NEEDED FOR WHEEZING OR  SHORTNESS OF BREATH Dettinger, Elige Radon, MD Taking Active   apixaban (ELIQUIS) 5 MG TABS tablet [id number] Yes Take 1 tablet (5 mg total) by mouth 2 (two) times daily. Sande Rives, MD Taking Active   atorvastatin (LIPITOR) 40 MG tablet [id number] Yes TAKE 1 TABLET BY MOUTH DAILY Dettinger, Elige Radon, MD Taking Active   Blood Glucose Monitoring Suppl (ACCU-CHEK GUIDE) w/Device KIT [id number] Yes TEST BLOOD SUGAR DAILY; DX: E11.9. Dettinger, Elige Radon, MD Taking Active   Cholecalciferol (VITAMIN D) 1000 UNITS capsule [id number] Yes Take 1,000 Units by mouth in the morning. [provider] Taking Active Self  Coenzyme Q10-Fish Oil-Vit E (CO-Q 10 OMEGA-3 FISH OIL) CAPS [id number] Yes Take 1 capsule by mouth in the morning. 100mg  Capsule Qunol [provider] Taking Active Self  dapagliflozin propanediol (FARXIGA) 10 MG TABS tablet [id number] Yes Take 1 tablet (10 mg total) by mouth daily before breakfast. Dettinger, Elige Radon, MD Taking Active   fluticasone (FLONASE) 50 MCG/ACT nasal spray [id number] Yes USE 1 SPRAY IN BOTH  NOSTRILS ONCE DAILY Dettinger, Elige Radon, MD Taking Active   glucose blood (ACCU-CHEK GUIDE) test strip [id number] Yes TEST BLOOD SUGAR DAILY; DX: E11.9. Dettinger, Elige Radon, MD Taking Active   guaiFENesin (MUCINEX) 600 MG 12 hr tablet [id number] No Take 1 tablet (600 mg total) by mouth 2 (two) times daily.  Patient not taking: Reported on 06/10/2023   Daryll Drown, NP Not Taking Active   lisinopril (ZESTRIL) 40 MG tablet  [id number] Yes Take 1 tablet by mouth once daily O'Neal, Ronnald Ramp, MD Taking Active   meclizine (ANTIVERT) 25 MG tablet [id number] Yes Take 1 tablet (25 mg total) by mouth 3 (three) times daily as needed for dizziness. Sande Rives, MD Taking Active   metFORMIN (GLUCOPHAGE-XR) 500 MG 24 hr tablet [id number] Yes TAKE 1 TABLET BY MOUTH TWICE  DAILY Dettinger, Elige Radon, MD Taking Active    metoprolol succinate (TOPROL-XL) 50 MG 24 hr tablet [id number] Yes TAKE 1 TABLET BY MOUTH DAILY  WITH OR IMMEDIATELY FOLLOWING A  MEAL O'Neal, Ronnald Ramp, MD Taking Active   Microlet Lancets MISC [id number] Yes USE TO TEST BLOOD SUGAR DAILY; DX: E11.9. PATIENT HAS GLUCOMETER AND LANCING DEVICE Dettinger, Elige Radon, MD Taking Active   omeprazole (PRILOSEC) 20 MG capsule [id number] Yes TAKE 1 CAPSULE BY MOUTH DAILY Dettinger, Elige Radon, MD Taking Active   spironolactone (ALDACTONE) 25 MG tablet [id number] Yes Take 1 tablet (25 mg total) by mouth daily. Sande Rives, MD Taking Active   tamsulosin [facility]) 0.4 MG CAPS capsule [id number] Yes TAKE 2 CAPSULES BY MOUTH DAILY  AFTER SUPPER Dettinger, Elige Radon, MD Taking Active               Assessment/Plan:   Hypertension: - Currently controlled   Patient reports his blood pressures are controlled at home  He follows with cardiology  - Reviewed long term cardiovascular and renal outcomes of uncontrolled blood pressure - Reviewed appropriate blood pressure monitoring technique and reviewed goal blood pressure. Recommended to check home blood pressure and heart rate daily or if symptomatic. Continue all medications as prescribed.     Follow Up Plan: as needed  Kieth Brightly, PharmD, BCACP Clinical Pharmacist, [facility]

## 2023-07-02 ENCOUNTER — Other Ambulatory Visit: Payer: Self-pay | Admitting: Family Medicine

## 2023-07-02 DIAGNOSIS — I4819 Other persistent atrial fibrillation: Secondary | ICD-10-CM

## 2023-07-02 DIAGNOSIS — I10 Essential (primary) hypertension: Secondary | ICD-10-CM

## 2023-08-11 NOTE — Progress Notes (Deleted)
Cardiology Office Note:    Date:  08/11/2023   ID:  Vincent Collins, DOB 02-02-1941, MRN [id number]  PCP:  Dettinger, Elige Radon, MD  Cardiologist:  Reatha Harps, MD  Electrophysiologist:  None   Referring MD: Dettinger, Elige Radon, MD   Chief Complaint: follow-up of hypertension  History of Present Illness:    Vincent Collins is a 82 y.o. male with a history of paroxysmal atrial fibrillation on Eliquis, ascending aorta dilatation, hypertension, hyperlipidemia, type 2 diabetes mellitus, COPD, GERD, and vertigo who is followed by Dr. Flora Lipps and presents today for follow-up of hypertension.  Patient is followed by Cardiology primarily for his atrial fibrillation. Initially diagnosed in 02/2020 in the setting of pancreatitis. Echo at that time showed LVEF of 65-70% with normal wall motion and moderate LVH, severely dilated left atrium, and moderate dilatation of the ascending aorta measuring 44 mm. He was started on Amiodarone and DOAC with restoration of sinus rhythm. Amiodarone was stopped in 04/2020. He has not had any recurrent documented atrial fibrillation since then.  He was last seen by Dr. Flora Lipps in 04/2023 at which time his BP was markedly elevated but he was otherwise doing well from a cardiac standpoint. He was started on Spironolactone for additional BP control. He also reported some issues with vertigo and was provided a prescription of Meclizine.  Patient presents today for follow-up of hypertension. ***  Hypertension BP  - Continue current medications: Lisinopril 40mg  daily, Toprol-XL 50mg  daily, and Spironolactone 25mg  daily. *** - Of note, he was previously unable to tolerate Amlodipine.  Paroxysmal Atrial Fibrillation Diagnosed in 02/2020 in setting of acute pericarditis. No documented recurrence since then. - Maintaining sinus rhythm on EKG. *** - Continue Toprol-XL 50mg  dialy. - Continue Eliquis 5mg  twice dialy.  Ascending Aorta Dilatation  Echo in 02/2020 showed moderate  dilatation of the ascending aorta measuring 44 mm. - Will reassess with repeat Echo. ***  Hyperlipidemia Lipid panel in 05/2023: Total Cholesterol 84, Triglycerides 74, HDL 40, LDL 28.  - Continue Lipitor 40mg  daily.  Type 2 Diabetes Mellitus Hemoglobin A1c 6.4% in 05/2023.  - On Farxiga.  EKGs/Labs/Other Studies Reviewed:    The following studies were reviewed:  Echocardiogram 03/27/2020: Impressions: 1. Left ventricular ejection fraction, by estimation, is 65 to 70%. The  left ventricle has normal function. The left ventricle has no regional  wall motion abnormalities. There is moderate left ventricular hypertrophy.  Left ventricular diastolic function   could not be evaluated.   2. Right ventricular systolic function is normal. The right ventricular  size is normal.   3. Left atrial size was severely dilated.   4. The mitral valve was not well visualized. No evidence of mitral valve  regurgitation.   5. The aortic valve was not well visualized. Aortic valve regurgitation  is not visualized.   6. Aortic dilatation noted. There is moderate dilatation of the ascending  aorta measuring 44 mm.   7. The inferior vena cava is normal in size with greater than 50%  respiratory variability, suggesting right atrial pressure of 3 mmHg.    EKG:  EKG ordered today. EKG personally reviewed and demonstrates ***.  Recent Labs: 06/05/2023: ALT 10; BUN 16; Creatinine, Ser 0.79; Hemoglobin 15.5; Platelets 143; Potassium 4.1; Sodium 142  Recent Lipid Panel    Component Value Date/Time   CHOL 84 (L) 06/05/2023 0909   TRIG 74 06/05/2023 0909   TRIG 97 11/02/2013 0949   HDL 40 06/05/2023 0909  HDL 40 11/02/2013 0949   CHOLHDL 2.1 06/05/2023 0909   CHOLHDL 2.4 09/21/2014 0620   VLDL 21 09/21/2014 0620   LDLCALC 28 06/05/2023 0909   LDLCALC 59 11/02/2013 0949    Physical Exam:    Vital Signs: There were no vitals taken for this visit.    Wt Readings from Last 3 Encounters:  06/10/23  226 lb (102.5 kg)  06/05/23 226 lb (102.5 kg)  05/18/23 225 lb 12.8 oz (102.4 kg)     General: 82 y.o. male in no acute distress. HEENT: Normocephalic and atraumatic. Sclera clear. EOMs intact. Neck: Supple. No carotid bruits. No JVD. Heart: *** RRR. Distinct S1 and S2. No murmurs, gallops, or rubs. Radial and distal pedal pulses 2+ and equal bilaterally. Lungs: No increased work of breathing. Clear to ausculation bilaterally. No wheezes, rhonchi, or rales.  Abdomen: Soft, non-distended, and non-tender to palpation. Bowel sounds present in all 4 quadrants.  MSK: Normal strength and tone for age. *** Extremities: No lower extremity edema.    Skin: Warm and dry. Neuro: Alert and oriented x3. No focal deficits. Psych: Normal affect. Responds appropriately.   Assessment:    No diagnosis found.  Plan:     Disposition: Follow up in ***   Medication Adjustments/Labs and Tests Ordered: Current medicines are reviewed at length with the patient today.  Concerns regarding medicines are outlined above.  No orders of the defined types were placed in this encounter.  No orders of the defined types were placed in this encounter.   There are no Patient Instructions on file for this visit.   Signed, Corrin Parker, PA-C  08/11/2023 2:09 PM     [facility]

## 2023-08-16 NOTE — Progress Notes (Unsigned)
Cardiology Office Note:  .   Date:  08/17/2023  ID:  Vincent Collins, DOB [birth year], MRN [id number] PCP: Dettinger, Elige Radon, MD  [facility] Providers Cardiologist:  Vincent Harps, MD    History of Present Illness: .   Vincent Collins is a 82 y.o. male with past medical history of paroxysmal atrial fibrillation on Eliquis, ascending aorta dilation, HTN, HLD, type 2 diabetes mellitus, COPD, GERD and vertigo who is followed by Dr. Flora Collins and presents today for follow up of hypertension.   In 02/2020 he was diagnosed with atrial fibrillation in the setting of pancreatitis.  Echocardiogram at that time showed LVEF of 65 to 70% with normal wall motion and moderate LVH, severely dilated left atrium and moderate dilation of the ascending aorta measuring 44 mm.  He was started on amiodarone and DOAC with restoration of sinus rhythm.  In 04/2020 amiodarone was stopped.  He has not had any documented recurrent atrial fibrillation since.  He was last seen by Dr. Flora Collins in 04/2023 for follow up, his blood pressure was elevated, he was otherwise stable from a cardiac standpoint. He was started on Spironolactone for additional BP control.  He also reported problems with vertigo and was provided a prescription of meclizine. On 06/12/23 he had follow up with Pharm D through his PCP for hypertension, was noted to be well controlled at that time.   Today he presents for follow-up for hypertension.  He reports that he is doing well overall. He has been monitoring his blood pressure at home, has been well controlled, averaging 130/80. 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 He and his wife routinely do yard work.  Reports that his PCP plan to start him on Farxiga in June, reports that he only took a few samples, was unsure if he needed this for blood pressure and notes that it was very expensive.   ROS: Today he denies chest pain,  shortness of breath, lower extremity edema, fatigue, palpitations, melena, hematuria, hemoptysis, diaphoresis, weakness, presyncope, syncope, orthopnea, and PND.   Studies Reviewed: Marland Kitchen   EKG Interpretation Date/Time:  Monday August 17 2023 09:32:52 EDT Ventricular Rate:  55 PR Interval:  198 QRS Duration:  106 QT Interval:  454 QTC Calculation: 434 R Axis:   -17  Text Interpretation: Sinus bradycardia Nonspecific T wave abnormality Confirmed by Reather Littler [phone]) on 08/17/2023 9:38:34 AM   Cardiac Studies & Procedures       ECHOCARDIOGRAM  ECHOCARDIOGRAM COMPLETE 03/27/2020  Narrative ECHOCARDIOGRAM REPORT    Patient Name:   Vincent Collins Date of Exam: 03/27/2020 Medical Rec #:  [id number]     Height:       68.0 in Accession #:    [id number]    Weight:       242.5 lb Date of Birth:  July 26, 1941     BSA:          2.218 m Patient Age:    78 years      BP:           157/76 mmHg Patient Gender: M             HR:           105 bpm. Exam Location:  Inpatient  Procedure: 2D Echo, Color Doppler and Cardiac Doppler  Indications:    I48.91* Unspecified atrial fibrillation  History:  Patient has prior history of Echocardiogram examinations, most recent 04/04/2009. COPD; Risk Factors:Hypertension, Diabetes and Dyslipidemia.  Sonographer:    Irving Burton Senior RDCS Referring Phys: 1914 SYLVESTER I OGBATA   Sonographer Comments: Technically difficult study due to patient body habitus and respiratory variation. IMPRESSIONS   1. Left ventricular ejection fraction, by estimation, is 65 to 70%. The left ventricle has normal function. The left ventricle has no regional wall motion abnormalities. There is moderate left ventricular hypertrophy. Left ventricular diastolic function could not be evaluated. 2. Right ventricular systolic function is normal. The right ventricular size is normal. 3. Left atrial size was severely dilated. 4. The mitral valve was not well visualized. No evidence of  mitral valve regurgitation. 5. The aortic valve was not well visualized. Aortic valve regurgitation is not visualized. 6. Aortic dilatation noted. 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 7. The inferior vena cava is normal in size with greater than 50% respiratory variability, suggesting right atrial pressure of 3 mmHg.  FINDINGS Left Ventricle: Left ventricular ejection fraction, by estimation, is 65 to 70%. The left ventricle has normal function. The left ventricle has no regional wall motion abnormalities. The left ventricular internal cavity size was normal in size. There is moderate left ventricular hypertrophy. Left ventricular diastolic function could not be evaluated due to atrial fibrillation. Left ventricular diastolic function could not be evaluated.  Right Ventricle: The right ventricular size is normal. No increase in right ventricular wall thickness. Right ventricular systolic function is normal.  Left Atrium: Left atrial size was severely dilated.  Right Atrium: Right atrial size was normal in size.  Pericardium: There is no evidence of pericardial effusion.  Mitral Valve: The mitral valve was not well visualized. No evidence of mitral valve regurgitation.  Tricuspid Valve: The tricuspid valve is not well visualized. Tricuspid valve regurgitation is trivial.  Aortic Valve: The aortic valve was not well visualized. Aortic valve regurgitation is not visualized.  Pulmonic Valve: The pulmonic valve was not well visualized. Pulmonic valve regurgitation is not visualized.  Aorta: Aortic dilatation noted. 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  Venous: The inferior vena cava is normal in size with greater than 50% respiratory variability, suggesting right atrial pressure of 3 mmHg.  IAS/Shunts: No atrial level shunt detected by color flow Doppler.   LEFT VENTRICLE PLAX 2D LVIDd:         5.50 cm LVIDs:         3.40  cm LV PW:         1.20 cm LV IVS:        1.60 cm LVOT diam:     2.30 cm LV SV:         83 LV SV Index:   37 LVOT Area:     4.15 cm   RIGHT VENTRICLE RV S prime:     22.80 cm/s TAPSE (M-mode): 2.5 cm  LEFT ATRIUM              Index       RIGHT ATRIUM           Index LA diam:        3.90 cm  1.76 cm/m  RA Area:     20.80 cm LA Vol (A2C):   100.3 ml 45.22 ml/m RA Volume:   56.80 ml  25.61 ml/m LA Vol (A4C):   118.0 ml 53.20 ml/m LA Biplane Vol: 120.0 ml 54.10 ml/m AORTIC VALVE LVOT  Vmax:   97.50 cm/s LVOT Vmean:  71.000 cm/s LVOT VTI:    0.199 m PULMONARY ARTERY AORTA                    MPA diam:        3.10 cm Ao Root diam: 3.90 cm Ao Asc diam:  4.40 cm   SHUNTS Systemic VTI:  0.20 m Systemic Diam: 2.30 cm  Zoila Shutter MD Electronically signed by Zoila Shutter MD Signature Date/Time: 03/27/2020/3:35:16 PM    Final             Risk Assessment/Calculations:    CHA2DS2-VASc Score = 4   This indicates a 4.8% annual risk of stroke. The patient's score is based upon: CHF History: 0 HTN History: 1 Diabetes History: 1 Stroke History: 0 Vascular Disease History: 0 Age Score: 2 Gender Score: 0    HYPERTENSION CONTROL Vitals:   08/17/23 0935 08/17/23 1006  BP: (!) 178/80 (!) 148/80    The patient's blood pressure is elevated above target today.  In order to address the patient's elevated BP: Blood pressure will be monitored at home to determine if medication changes need to be made.; The blood pressure is usually elevated in clinic.  Blood pressures monitored at home have been optimal.         Physical Exam:   VS:  BP (!) 148/80   Pulse (!) 55   Ht 5\' 8"  (1.727 m)   Wt 229 lb (103.9 kg)   SpO2 98%   BMI 34.82 kg/m    Wt Readings from Last 3 Encounters:  08/17/23 229 lb (103.9 kg)  06/10/23 226 lb (102.5 kg)  06/05/23 226 lb (102.5 kg)    GEN: Well nourished, well developed in no acute distress NECK: No JVD; No carotid bruits CARDIAC: RRR,  no murmurs, rubs, gallops RESPIRATORY:  Clear to auscultation without rales, wheezing or rhonchi  ABDOMEN: Soft, non-tender, non-distended EXTREMITIES:  No edema; No deformity   ASSESSMENT AND PLAN: .    Hypertension: Initial blood pressure today 178/80, on recheck was 148/80. Reviewed his home blood pressure logs, he is averaging 130/80 at home, this morning his BP was 136/78. Discussed plan to continue monitoring at home, if his blood pressure is consistently elevated above 140/90 he will notify the office. Of note he was previously unable to tolerate Amlodipine. Continue lisinopril 40 mg daily, Toprol XL 50 mg daily and spironolactone 25 mg daily.  Paroxysmal atrial fibrillation: Diagnosed in 02/2020 in setting of acute pancreatitis.  No documented recurrence. EKG today indicates sinus bradycardia at 55bpm. He denies bleeding problems on Eliquis. Denies palpitations. Continue Eliquis 5mg  twice daily, last creatinine 0.79 on 06/05/23. Continue Toprol-XL 50mg  daily.   Diabetes mellitus type II: Most recent A1C was 6.4 on 06/05/2023. PCP planned to initiate Marcelline Deist, patient notes he only took a few samples then stopped noting cost, recommended he follow up with his PCP.   HLD: Last lipid profile on 06/05/2023 indicates total cholesterol of 84 and LDL of 28. Continue atorvastatin.   Ascending aorta dilatation: Echocardiogram in 02/2020 showed moderate dilation of the ascending aorta measuring 44 mm.  Repeat echocardiogram for routine monitoring. If increased in size will plan for CTA.   COPD: Denies current shortness of breath or DOE. Monitored and managed by PCP.        Dispo: Follow up with Dr. Flora Collins in one year.   Signed, Rip Harbour, NP

## 2023-08-17 ENCOUNTER — Encounter: Payer: Self-pay | Admitting: Cardiology

## 2023-08-17 ENCOUNTER — Ambulatory Visit: Payer: Medicare Other | Attending: Student | Admitting: Cardiology

## 2023-08-17 VITALS — BP 148/80 | HR 55 | Ht 68.0 in | Wt 229.0 lb

## 2023-08-17 DIAGNOSIS — J449 Chronic obstructive pulmonary disease, unspecified: Secondary | ICD-10-CM

## 2023-08-17 DIAGNOSIS — I4819 Other persistent atrial fibrillation: Secondary | ICD-10-CM

## 2023-08-17 DIAGNOSIS — D6859 Other primary thrombophilia: Secondary | ICD-10-CM

## 2023-08-17 DIAGNOSIS — I10 Essential (primary) hypertension: Secondary | ICD-10-CM | POA: Diagnosis not present

## 2023-08-17 DIAGNOSIS — I77810 Thoracic aortic ectasia: Secondary | ICD-10-CM

## 2023-08-17 DIAGNOSIS — E782 Mixed hyperlipidemia: Secondary | ICD-10-CM | POA: Diagnosis not present

## 2023-08-17 NOTE — Patient Instructions (Signed)
Medication Instructions:  The current medical regimen is effective;  continue present plan and medications as directed. Please refer to the Current Medication list given to you today.  *If you need a refill on your cardiac medications before your next appointment, please call your pharmacy*  Lab Work: NONE If you have labs (blood work) drawn today and your tests are completely normal, you will receive your results only by:    MyChart Message (if you have MyChart) OR  A paper copy in the mail If you have any lab test that is abnormal or we need to change your treatment, we will call you to review the results.  Testing/Procedures: Your physician has requested that you have an echocardiogram. Echocardiography is a painless test that uses sound waves to create images of your heart. It provides your doctor with information about the size and shape of your heart and how well your heart's chambers and valves are working. This procedure takes approximately one hour. There are no restrictions for this procedure. Please do NOT wear cologne, perfume, aftershave, or lotions (deodorant is allowed). Please arrive 15 minutes prior to your appointment time.   Follow-Up: At [facility], you and your health needs are our priority.  As part of our continuing mission to provide you with exceptional heart care, we have created designated Provider Care Teams.  These Care Teams include your primary Cardiologist (physician) and Advanced Practice Providers (APPs -  Physician Assistants and Nurse Practitioners) who all work together to provide you with the care you need, when you need it.  Your next appointment:   12 month(s)  Provider:   Reatha Harps, MD     Other Instructions

## 2023-09-07 ENCOUNTER — Other Ambulatory Visit: Payer: Self-pay | Admitting: Cardiovascular Disease

## 2023-09-07 DIAGNOSIS — E1159 Type 2 diabetes mellitus with other circulatory complications: Secondary | ICD-10-CM

## 2023-09-09 ENCOUNTER — Ambulatory Visit (HOSPITAL_COMMUNITY): Payer: Medicare Other | Attending: Cardiology

## 2023-09-09 ENCOUNTER — Other Ambulatory Visit: Payer: Self-pay | Admitting: Family Medicine

## 2023-09-09 DIAGNOSIS — J449 Chronic obstructive pulmonary disease, unspecified: Secondary | ICD-10-CM | POA: Insufficient documentation

## 2023-09-09 DIAGNOSIS — E785 Hyperlipidemia, unspecified: Secondary | ICD-10-CM | POA: Insufficient documentation

## 2023-09-09 DIAGNOSIS — N401 Enlarged prostate with lower urinary tract symptoms: Secondary | ICD-10-CM

## 2023-09-09 DIAGNOSIS — I77810 Thoracic aortic ectasia: Secondary | ICD-10-CM | POA: Insufficient documentation

## 2023-09-09 DIAGNOSIS — I10 Essential (primary) hypertension: Secondary | ICD-10-CM | POA: Insufficient documentation

## 2023-09-09 DIAGNOSIS — E119 Type 2 diabetes mellitus without complications: Secondary | ICD-10-CM | POA: Diagnosis not present

## 2023-09-09 DIAGNOSIS — I4819 Other persistent atrial fibrillation: Secondary | ICD-10-CM

## 2023-09-09 LAB — ECHOCARDIOGRAM COMPLETE
Area-P 1/2: 2.42 cm2
P 1/2 time: 1136 ms
S' Lateral: 3.7 cm

## 2023-09-10 ENCOUNTER — Other Ambulatory Visit: Payer: Self-pay

## 2023-09-10 ENCOUNTER — Other Ambulatory Visit: Payer: Self-pay | Admitting: Family Medicine

## 2023-09-10 DIAGNOSIS — I77810 Thoracic aortic ectasia: Secondary | ICD-10-CM

## 2023-09-10 DIAGNOSIS — J449 Chronic obstructive pulmonary disease, unspecified: Secondary | ICD-10-CM

## 2023-09-10 DIAGNOSIS — Z01812 Encounter for preprocedural laboratory examination: Secondary | ICD-10-CM

## 2023-09-11 DIAGNOSIS — I77810 Thoracic aortic ectasia: Secondary | ICD-10-CM | POA: Diagnosis not present

## 2023-09-11 DIAGNOSIS — Z01812 Encounter for preprocedural laboratory examination: Secondary | ICD-10-CM | POA: Diagnosis not present

## 2023-09-12 LAB — BASIC METABOLIC PANEL
BUN/Creatinine Ratio: 13 (ref 10–24)
BUN: 13 mg/dL (ref 8–27)
CO2: 22 mmol/L (ref 20–29)
Calcium: 9.2 mg/dL (ref 8.6–10.2)
Chloride: 103 mmol/L (ref 96–106)
Creatinine, Ser: 1.03 mg/dL (ref 0.76–1.27)
Glucose: 121 mg/dL — ABNORMAL HIGH (ref 70–99)
Potassium: 4.9 mmol/L (ref 3.5–5.2)
Sodium: 141 mmol/L (ref 134–144)
eGFR: 73 mL/min/{1.73_m2} (ref >59)

## 2023-09-16 ENCOUNTER — Ambulatory Visit (HOSPITAL_COMMUNITY): Payer: Medicare Other

## 2023-09-17 ENCOUNTER — Ambulatory Visit (HOSPITAL_COMMUNITY): Payer: Medicare Other

## 2023-09-23 ENCOUNTER — Encounter: Payer: Self-pay | Admitting: Family Medicine

## 2023-09-23 ENCOUNTER — Ambulatory Visit (INDEPENDENT_AMBULATORY_CARE_PROVIDER_SITE_OTHER): Payer: Medicare Other | Admitting: Family Medicine

## 2023-09-23 ENCOUNTER — Ambulatory Visit (HOSPITAL_COMMUNITY)
Admission: RE | Admit: 2023-09-23 | Discharge: 2023-09-23 | Disposition: A | Discharge status: Home / Self Care | Payer: Medicare Other | Source: Ambulatory Visit | Attending: Cardiovascular Disease | Admitting: Cardiovascular Disease

## 2023-09-23 VITALS — BP 165/82 | HR 66 | Ht 68.0 in | Wt 225.0 lb

## 2023-09-23 DIAGNOSIS — N401 Enlarged prostate with lower urinary tract symptoms: Secondary | ICD-10-CM | POA: Diagnosis not present

## 2023-09-23 DIAGNOSIS — Z23 Encounter for immunization: Secondary | ICD-10-CM | POA: Diagnosis not present

## 2023-09-23 DIAGNOSIS — I4819 Other persistent atrial fibrillation: Secondary | ICD-10-CM | POA: Diagnosis not present

## 2023-09-23 DIAGNOSIS — Z7984 Long term (current) use of oral hypoglycemic drugs: Secondary | ICD-10-CM | POA: Diagnosis not present

## 2023-09-23 DIAGNOSIS — I10 Essential (primary) hypertension: Secondary | ICD-10-CM

## 2023-09-23 DIAGNOSIS — E785 Hyperlipidemia, unspecified: Secondary | ICD-10-CM | POA: Diagnosis not present

## 2023-09-23 DIAGNOSIS — I700 Atherosclerosis of aorta: Secondary | ICD-10-CM | POA: Diagnosis not present

## 2023-09-23 DIAGNOSIS — I77810 Thoracic aortic ectasia: Secondary | ICD-10-CM | POA: Insufficient documentation

## 2023-09-23 DIAGNOSIS — E1169 Type 2 diabetes mellitus with other specified complication: Secondary | ICD-10-CM | POA: Diagnosis not present

## 2023-09-23 DIAGNOSIS — K449 Diaphragmatic hernia without obstruction or gangrene: Secondary | ICD-10-CM | POA: Diagnosis not present

## 2023-09-23 DIAGNOSIS — R351 Nocturia: Secondary | ICD-10-CM

## 2023-09-23 LAB — BAYER DCA HB A1C WAIVED: HB A1C (BAYER DCA - WAIVED): 6.1 % — ABNORMAL HIGH (ref 4.8–5.6)

## 2023-09-23 MED ORDER — IOHEXOL 350 MG/ML SOLN
75.0000 mL | Freq: Once | INTRAVENOUS | Status: AC | PRN
  Administered 2023-09-23: 75 mL via INTRAVENOUS

## 2023-09-23 MED ORDER — TAMSULOSIN HCL 0.4 MG PO CAPS
ORAL_CAPSULE | ORAL | 3 refills | Status: DC
Start: 2023-09-23 — End: 2023-11-18

## 2023-09-23 NOTE — Progress Notes (Signed)
BP (!) 165/82   Pulse 66   Ht 5\' 8"  (1.727 m)   Wt 225 lb (102.1 kg)   SpO2 96%   BMI 34.21 kg/m    Subjective:   Patient ID: Vincent Collins, male    DOB: [birth year], 82 y.o.   MRN: [id number]  HPI: Vincent Collins is a 82 y.o. male presenting on 09/23/2023 for Medical Management of Chronic Issues and Diabetes   HPI Type 2 diabetes mellitus Patient comes in today for recheck of his diabetes. Patient has been currently taking metformin and [place]. Patient is currently on an ACE inhibitor/ARB. Patient has seen an ophthalmologist this year. Patient denies any new issues with their feet. The symptom started onset as an adult hypertension and hyperlipidemia ARE RELATED TO DM   Hypertension and A-fib Patient is currently on lisinopril and spironolactone and metoprolol, dose recently adjusted by cardiology, and their blood pressure today is 165/82 but he says his blood pressures at home are running perfect, he did not bring the numbers with him today but he is following with cardiology closely for this. Patient denies any lightheadedness or dizziness. Patient denies headaches, blurred vision, chest pains, shortness of breath, or weakness. Denies any side effects from medication and is content with current medication.  He also takes Eliquis for A-fib  Hyperlipidemia Patient is coming in for recheck of his hyperlipidemia. The patient is currently taking atorvastatin. They deny any issues with myalgias or history of liver damage from it. They deny any focal numbness or weakness or chest pain.   BPH Patient is coming in for recheck on BPH Symptoms: None currently Medication: Flomax Last PSA: A year and a half ago, normal  Relevant past medical, surgical, family and social history reviewed and updated as indicated. Interim medical history since our last visit reviewed. Allergies and medications reviewed and updated.  Review of Systems  Constitutional:  Negative for chills and fever.  Eyes:   Negative for visual disturbance.  Respiratory:  Negative for shortness of breath and wheezing.   Cardiovascular:  Negative for chest pain and leg swelling.  Skin:  Negative for rash.  Neurological:  Negative for dizziness, weakness and light-headedness.  All other systems reviewed and are negative.   Per HPI unless specifically indicated above   Allergies as of 09/23/2023   No Known Allergies      Medication List        Accurate as of September 23, 2023  8:49 AM. If you have any questions, ask your nurse or doctor.          Accu-Chek Guide test strip Generic drug: glucose blood TEST BLOOD SUGAR DAILY; DX: E11.9.   Accu-Chek Guide w/Device Kit TEST BLOOD SUGAR DAILY; DX: E11.9.   acetaminophen 325 MG tablet Commonly known as: TYLENOL Take 2 tablets (650 mg total) by mouth every 6 (six) hours as needed for mild pain (or Fever >/= 101).   albuterol (2.5 MG/3ML) 0.083% nebulizer solution Commonly known as: PROVENTIL USE 1 VIAL VIA NEBULIZER EVERY 6 HOURS AS NEEDED FOR WHEEZING OR  SHORTNESS OF BREATH   apixaban 5 MG Tabs tablet Commonly known as: Eliquis Take 1 tablet (5 mg total) by mouth 2 (two) times daily.   atorvastatin 40 MG tablet Commonly known as: LIPITOR TAKE 1 TABLET BY MOUTH DAILY   CO-Q 10 Omega-3 Fish Oil Caps Take 1 capsule by mouth in the morning. 100mg  Capsule Qunol   dapagliflozin propanediol 10 MG Tabs tablet Commonly known as:  Farxiga Take 1 tablet (10 mg total) by mouth daily before breakfast.   fluticasone 50 MCG/ACT nasal spray Commonly known as: FLONASE USE 1 SPRAY IN BOTH  NOSTRILS ONCE DAILY   guaiFENesin 600 MG 12 hr tablet Commonly known as: Mucinex Take 1 tablet (600 mg total) by mouth 2 (two) times daily.   lisinopril 40 MG tablet Commonly known as: ZESTRIL Take 1 tablet by mouth once daily   meclizine 25 MG tablet Commonly known as: ANTIVERT Take 1 tablet (25 mg total) by mouth 3 (three) times daily as needed for  dizziness.   metFORMIN 500 MG 24 hr tablet Commonly known as: GLUCOPHAGE-XR TAKE 1 TABLET BY MOUTH TWICE  DAILY   metoprolol succinate 50 MG 24 hr tablet Commonly known as: TOPROL-XL TAKE 1 TABLET BY MOUTH DAILY  WITH OR IMMEDIATELY FOLLOWING A  MEAL   Microlet Lancets Misc USE TO TEST BLOOD SUGAR DAILY; DX: E11.9. PATIENT HAS GLUCOMETER AND LANCING DEVICE   omeprazole 20 MG capsule Commonly known as: PRILOSEC TAKE 1 CAPSULE BY MOUTH DAILY   spironolactone 25 MG tablet Commonly known as: ALDACTONE Take 1 tablet (25 mg total) by mouth daily.   tamsulosin 0.4 MG Caps capsule Commonly known as: FLOMAX TAKE 2 CAPSULES BY MOUTH DAILY  AFTER SUPPER   Vitamin D 1000 units capsule Take 1,000 Units by mouth in the morning.         Objective:   BP (!) 165/82   Pulse 66   Ht 5\' 8"  (1.727 m)   Wt 225 lb (102.1 kg)   SpO2 96%   BMI 34.21 kg/m   Wt Readings from Last 3 Encounters:  09/23/23 225 lb (102.1 kg)  08/17/23 229 lb (103.9 kg)  06/10/23 226 lb (102.5 kg)    Physical Exam Vitals and nursing note reviewed.  Constitutional:      General: He is not in acute distress.    Appearance: He is well-developed. He is not diaphoretic.  Eyes:     General: No scleral icterus.    Conjunctiva/sclera: Conjunctivae normal.  Neck:     Thyroid: No thyromegaly.  Cardiovascular:     Rate and Rhythm: Normal rate and regular rhythm.     Heart sounds: Normal heart sounds. No murmur heard. Pulmonary:     Effort: Pulmonary effort is normal. No respiratory distress.     Breath sounds: Normal breath sounds. No wheezing or rhonchi.  Musculoskeletal:     Cervical back: Neck supple.  Lymphadenopathy:     Cervical: No cervical adenopathy.  Skin:    General: Skin is warm and dry.     Findings: No rash.  Neurological:     Mental Status: He is alert and oriented to person, place, and time.     Coordination: Coordination normal.  Psychiatric:        Behavior: Behavior normal.        Assessment & Plan:   Problem List Items Addressed This Visit       Cardiovascular and Mediastinum   Essential hypertension   Persistent atrial fibrillation (HCC)     Endocrine   Hyperlipidemia associated with type 2 diabetes mellitus (HCC)   Type 2 diabetes mellitus (HCC) - Primary   Relevant Orders   Hepatic Function Panel   Bayer DCA Hb A1c Waived     Genitourinary   BPH (benign prostatic hyperplasia)   Relevant Medications   tamsulosin (FLOMAX) 0.4 MG CAPS capsule   Other Relevant Orders   PSA, total and  free    A1c looks good at 6.1, doing pretty well.  Blood pressure is elevated but he says it runs good at home and cardiology did just make some med adjustments so we will leave that with cardiology and he will continue to monitor at home. Follow up plan: Return in about 3 months (around 12/23/2023), or if symptoms worsen or fail to improve, for Diabetes recheck.  Counseling provided for all of the vaccine components Orders Placed This Encounter  Procedures   Hepatic Function Panel   Bayer DCA Hb A1c Waived   PSA, total and free    Arville Care, MD [facility] 09/23/2023, 8:49 AM

## 2023-09-24 LAB — HEPATIC FUNCTION PANEL
ALT: 11 IU/L (ref 0–44)
AST: 16 IU/L (ref 0–40)
Albumin: 4.1 g/dL (ref 3.7–4.7)
Alkaline Phosphatase: 87 IU/L (ref 44–121)
Bilirubin Total: 1.5 mg/dL — ABNORMAL HIGH (ref 0.0–1.2)
Bilirubin, Direct: 0.39 mg/dL (ref 0.00–0.40)
Total Protein: 5.9 g/dL — ABNORMAL LOW (ref 6.0–8.5)

## 2023-10-05 ENCOUNTER — Telehealth: Payer: Self-pay | Admitting: Cardiovascular Disease

## 2023-10-05 ENCOUNTER — Other Ambulatory Visit: Payer: Self-pay | Admitting: Family Medicine

## 2023-10-05 DIAGNOSIS — I4819 Other persistent atrial fibrillation: Secondary | ICD-10-CM

## 2023-10-05 DIAGNOSIS — I10 Essential (primary) hypertension: Secondary | ICD-10-CM

## 2023-10-05 NOTE — Telephone Encounter (Signed)
Pt spouse called in for pt CT results

## 2023-10-05 NOTE — Telephone Encounter (Signed)
Advised of results and states understanding

## 2023-10-08 ENCOUNTER — Other Ambulatory Visit (HOSPITAL_COMMUNITY): Payer: Self-pay | Admitting: *Deleted

## 2023-10-08 DIAGNOSIS — I77810 Thoracic aortic ectasia: Secondary | ICD-10-CM

## 2023-11-18 ENCOUNTER — Other Ambulatory Visit: Payer: Self-pay | Admitting: Family Medicine

## 2023-11-18 DIAGNOSIS — N401 Enlarged prostate with lower urinary tract symptoms: Secondary | ICD-10-CM

## 2023-11-20 ENCOUNTER — Ambulatory Visit: Payer: Medicare Other | Admitting: Family

## 2023-11-20 ENCOUNTER — Encounter: Payer: Self-pay | Admitting: Family Medicine

## 2023-11-20 ENCOUNTER — Ambulatory Visit: Payer: Medicare Other | Admitting: Family Medicine

## 2023-11-20 VITALS — BP 139/78 | HR 68 | Temp 98.6°F | Ht 68.0 in | Wt 226.0 lb

## 2023-11-20 DIAGNOSIS — J441 Chronic obstructive pulmonary disease with (acute) exacerbation: Secondary | ICD-10-CM

## 2023-11-20 MED ORDER — PREDNISONE 20 MG PO TABS
ORAL_TABLET | ORAL | 0 refills | Status: DC
Start: 2023-11-20 — End: 2023-12-11

## 2023-11-20 MED ORDER — CEFTRIAXONE SODIUM 1 G IJ SOLR
1.0000 g | Freq: Once | INTRAMUSCULAR | Status: AC
Start: 2023-11-20 — End: 2023-11-20
  Administered 2023-11-20: 1 g via INTRAMUSCULAR

## 2023-11-20 MED ORDER — AMOXICILLIN-POT CLAVULANATE 875-125 MG PO TABS: 1.0000 | ORAL_TABLET | Freq: Two times a day (BID) | ORAL | 0 refills | Status: DC

## 2023-11-20 NOTE — Progress Notes (Signed)
BP 139/78   Pulse 68   Temp 98.6 F (37 C)   Ht 5\' 8"  (1.727 m)   Wt 226 lb (102.5 kg)   SpO2 94%   BMI 34.36 kg/m    Subjective:   Patient ID: Vincent Collins, male    DOB: February 11, 1941, 82 y.o.   MRN: [id number]  HPI: Vincent Collins is a 81 y.o. male presenting on 11/20/2023 for Fatigue and Cough   HPI Cough and congestion and wheezing Patient is coming in today with cough and congestion and wheezing.  He says it has been going on about 4 days and he has been feeling weak and coughing and when he coughs he coughs up some blood.  He says he is not eating as well.  He is drinking plenty of fluids.  He says his had fevers and chills and lots of wheezing.  He has been using his inhalers and trying to help but he is just feeling more more weak.  He denies any sick contacts that he knows of although they do have the grandchildren sometimes and there have been some coughing and stuff going on but they do not know what they had.  Relevant past medical, surgical, family and social history reviewed and updated as indicated. Interim medical history since our last visit reviewed. Allergies and medications reviewed and updated.  Review of Systems  Constitutional:  Positive for chills, fatigue and fever.  HENT:  Positive for congestion. Negative for ear discharge, ear pain, postnasal drip, rhinorrhea, sinus pressure, sneezing, sore throat and voice change.   Eyes:  Negative for pain, discharge, redness and visual disturbance.  Respiratory:  Positive for cough, shortness of breath and wheezing.   Cardiovascular:  Negative for chest pain and leg swelling.  Musculoskeletal:  Positive for myalgias. Negative for gait problem.  Skin:  Negative for rash.  Neurological:  Positive for weakness.  All other systems reviewed and are negative.   Per HPI unless specifically indicated above   Allergies as of 11/20/2023   No Known Allergies      Medication List        Accurate as of November 20, 2023  3:51 PM. If you have any questions, ask your nurse or doctor.          Accu-Chek Guide test strip Generic drug: glucose blood TEST BLOOD SUGAR DAILY; DX: E11.9.   Accu-Chek Guide w/Device Kit TEST BLOOD SUGAR DAILY; DX: E11.9.   acetaminophen 325 MG tablet Commonly known as: TYLENOL Take 2 tablets (650 mg total) by mouth every 6 (six) hours as needed for mild pain (or Fever >/= 101).   albuterol (2.5 MG/3ML) 0.083% nebulizer solution Commonly known as: PROVENTIL USE 1 VIAL VIA NEBULIZER EVERY 6 HOURS AS NEEDED FOR WHEEZING OR  SHORTNESS OF BREATH   amoxicillin-clavulanate 875-125 MG tablet Commonly known as: AUGMENTIN Take 1 tablet by mouth 2 (two) times daily. Started by: Elige Radon Helton Oleson   apixaban 5 MG Tabs tablet Commonly known as: Eliquis Take 1 tablet (5 mg total) by mouth 2 (two) times daily.   atorvastatin 40 MG tablet Commonly known as: LIPITOR TAKE 1 TABLET BY MOUTH DAILY   CO-Q 10 Omega-3 Fish Oil Caps Take 1 capsule by mouth in the morning. 100mg  Capsule Qunol   dapagliflozin propanediol 10 MG Tabs tablet Commonly known as: Farxiga Take 1 tablet (10 mg total) by mouth daily before breakfast.   fluticasone 50 MCG/ACT nasal spray Commonly known as: FLONASE USE 1  SPRAY IN BOTH  NOSTRILS ONCE DAILY   guaiFENesin 600 MG 12 hr tablet Commonly known as: Mucinex Take 1 tablet (600 mg total) by mouth 2 (two) times daily.   lisinopril 40 MG tablet Commonly known as: ZESTRIL Take 1 tablet by mouth once daily   meclizine 25 MG tablet Commonly known as: ANTIVERT Take 1 tablet (25 mg total) by mouth 3 (three) times daily as needed for dizziness.   metFORMIN 500 MG 24 hr tablet Commonly known as: GLUCOPHAGE-XR TAKE 1 TABLET BY MOUTH TWICE  DAILY   metoprolol succinate 50 MG 24 hr tablet Commonly known as: TOPROL-XL TAKE 1 TABLET BY MOUTH DAILY  WITH OR IMMEDIATELY FOLLOWING A  MEAL   Microlet Lancets Misc USE TO TEST BLOOD SUGAR DAILY; DX: E11.9.  PATIENT HAS GLUCOMETER AND LANCING DEVICE   omeprazole 20 MG capsule Commonly known as: PRILOSEC TAKE 1 CAPSULE BY MOUTH DAILY   predniSONE 20 MG tablet Commonly known as: DELTASONE 2 po at same time daily for 5 days Started by: Elige Radon Ryla Cauthon   spironolactone 25 MG tablet Commonly known as: ALDACTONE Take 1 tablet (25 mg total) by mouth daily.   tamsulosin 0.4 MG Caps capsule Commonly known as: FLOMAX TAKE 2 CAPSULES BY MOUTH DAILY  AFTER SUPPER   Vitamin D 1000 units capsule Take 1,000 Units by mouth in the morning.         Objective:   BP 139/78   Pulse 68   Temp 98.6 F (37 C)   Ht 5\' 8"  (1.727 m)   Wt 226 lb (102.5 kg)   SpO2 94%   BMI 34.36 kg/m   Wt Readings from Last 3 Encounters:  11/20/23 226 lb (102.5 kg)  09/23/23 225 lb (102.1 kg)  08/17/23 229 lb (103.9 kg)    Physical Exam Vitals and nursing note reviewed.  Constitutional:      General: He is not in acute distress.    Appearance: He is well-developed. He is not diaphoretic.  HENT:     Nose:     Right Sinus: No maxillary sinus tenderness or frontal sinus tenderness.     Left Sinus: No maxillary sinus tenderness or frontal sinus tenderness.     Mouth/Throat:     Pharynx: Uvula midline. No oropharyngeal exudate or posterior oropharyngeal erythema.     Tonsils: No tonsillar abscesses.  Eyes:     General: No scleral icterus.    Conjunctiva/sclera: Conjunctivae normal.  Neck:     Thyroid: No thyromegaly.  Cardiovascular:     Rate and Rhythm: Normal rate and regular rhythm.     Heart sounds: Normal heart sounds. No murmur heard. Pulmonary:     Effort: Pulmonary effort is normal. No respiratory distress.     Breath sounds: Wheezing and rhonchi present. No rales.  Musculoskeletal:        General: Normal range of motion.     Cervical back: Neck supple.  Lymphadenopathy:     Cervical: No cervical adenopathy.  Skin:    General: Skin is warm and dry.     Findings: No rash.  Neurological:      Mental Status: He is alert and oriented to person, place, and time.     Coordination: Coordination normal.  Psychiatric:        Behavior: Behavior normal.       Assessment & Plan:   Problem List Items Addressed This Visit   None Visit Diagnoses     COPD exacerbation (HCC)    -  Primary   Relevant Medications   cefTRIAXone (ROCEPHIN) injection 1 g   amoxicillin-clavulanate (AUGMENTIN) 875-125 MG tablet   predniSONE (DELTASONE) 20 MG tablet     Will treat like possible COPD exacerbation or starting on early pneumonia.  Given Rocephin IM 1 g today and will give Augmentin and prednisone.  Watch his sugars closely at home and if anything worsens go to the emergency department.  Follow up plan: Return if symptoms worsen or fail to improve.  Counseling provided for all of the vaccine components No orders of the defined types were placed in this encounter.   Arville Care, MD [facility] 11/20/2023, 3:51 PM

## 2023-12-04 ENCOUNTER — Emergency Department (HOSPITAL_COMMUNITY)
Admission: EM | Admit: 2023-12-04 | Discharge: 2023-12-04 | Disposition: A | Discharge status: Home / Self Care | Payer: Medicare Other | Attending: Emergency Medicine | Admitting: Emergency Medicine

## 2023-12-04 ENCOUNTER — Other Ambulatory Visit: Payer: Self-pay

## 2023-12-04 ENCOUNTER — Encounter (HOSPITAL_COMMUNITY): Payer: Self-pay

## 2023-12-04 ENCOUNTER — Emergency Department (HOSPITAL_COMMUNITY): Payer: Medicare Other

## 2023-12-04 DIAGNOSIS — K5730 Diverticulosis of large intestine without perforation or abscess without bleeding: Secondary | ICD-10-CM | POA: Diagnosis not present

## 2023-12-04 DIAGNOSIS — E119 Type 2 diabetes mellitus without complications: Secondary | ICD-10-CM | POA: Insufficient documentation

## 2023-12-04 DIAGNOSIS — R109 Unspecified abdominal pain: Secondary | ICD-10-CM | POA: Diagnosis not present

## 2023-12-04 DIAGNOSIS — J449 Chronic obstructive pulmonary disease, unspecified: Secondary | ICD-10-CM | POA: Diagnosis not present

## 2023-12-04 DIAGNOSIS — J9811 Atelectasis: Secondary | ICD-10-CM | POA: Diagnosis not present

## 2023-12-04 DIAGNOSIS — J90 Pleural effusion, not elsewhere classified: Secondary | ICD-10-CM | POA: Diagnosis not present

## 2023-12-04 DIAGNOSIS — I700 Atherosclerosis of aorta: Secondary | ICD-10-CM | POA: Diagnosis not present

## 2023-12-04 DIAGNOSIS — X58XXXA Exposure to other specified factors, initial encounter: Secondary | ICD-10-CM | POA: Diagnosis not present

## 2023-12-04 DIAGNOSIS — I10 Essential (primary) hypertension: Secondary | ICD-10-CM | POA: Insufficient documentation

## 2023-12-04 DIAGNOSIS — S29012A Strain of muscle and tendon of back wall of thorax, initial encounter: Secondary | ICD-10-CM | POA: Diagnosis not present

## 2023-12-04 DIAGNOSIS — R918 Other nonspecific abnormal finding of lung field: Secondary | ICD-10-CM | POA: Diagnosis not present

## 2023-12-04 DIAGNOSIS — I77810 Thoracic aortic ectasia: Secondary | ICD-10-CM | POA: Diagnosis not present

## 2023-12-04 DIAGNOSIS — S3992XA Unspecified injury of lower back, initial encounter: Secondary | ICD-10-CM | POA: Diagnosis present

## 2023-12-04 DIAGNOSIS — R0602 Shortness of breath: Secondary | ICD-10-CM | POA: Diagnosis not present

## 2023-12-04 LAB — COMPREHENSIVE METABOLIC PANEL
ALT: 23 U/L (ref 0–44)
AST: 21 U/L (ref 15–41)
Albumin: 2.8 g/dL — ABNORMAL LOW (ref 3.5–5.0)
Alkaline Phosphatase: 79 U/L (ref 38–126)
Anion gap: 10 (ref 5–15)
BUN: 14 mg/dL (ref 8–23)
CO2: 26 mmol/L (ref 22–32)
Calcium: 8.6 mg/dL — ABNORMAL LOW (ref 8.9–10.3)
Chloride: 100 mmol/L (ref 98–111)
Creatinine, Ser: 1.07 mg/dL (ref 0.61–1.24)
GFR, Estimated: >60 mL/min (ref >60)
Glucose, Bld: 135 mg/dL — ABNORMAL HIGH (ref 70–99)
Potassium: 4.9 mmol/L (ref 3.5–5.1)
Sodium: 136 mmol/L (ref 135–145)
Total Bilirubin: 0.9 mg/dL (ref <1.2)
Total Protein: 5.9 g/dL — ABNORMAL LOW (ref 6.5–8.1)

## 2023-12-04 LAB — CBC WITH DIFFERENTIAL/PLATELET
Abs Immature Granulocytes: 0.06 10*3/uL (ref 0.00–0.07)
Basophils Absolute: 0.1 10*3/uL (ref 0.0–0.1)
Basophils Relative: 1 %
Eosinophils Absolute: 0.1 10*3/uL (ref 0.0–0.5)
Eosinophils Relative: 0 %
HCT: 43.4 % (ref 39.0–52.0)
Hemoglobin: 14.4 g/dL (ref 13.0–17.0)
Immature Granulocytes: 1 %
Lymphocytes Relative: 16 %
Lymphs Abs: 1.8 10*3/uL (ref 0.7–4.0)
MCH: 30.3 pg (ref 26.0–34.0)
MCHC: 33.2 g/dL (ref 30.0–36.0)
MCV: 91.2 fL (ref 80.0–100.0)
Monocytes Absolute: 1.1 10*3/uL — ABNORMAL HIGH (ref 0.1–1.0)
Monocytes Relative: 10 %
Neutro Abs: 8.4 10*3/uL — ABNORMAL HIGH (ref 1.7–7.7)
Neutrophils Relative %: 72 %
Platelets: 267 10*3/uL (ref 150–400)
RBC: 4.76 MIL/uL (ref 4.22–5.81)
RDW: 13.5 % (ref 11.5–15.5)
WBC: 11.5 10*3/uL — ABNORMAL HIGH (ref 4.0–10.5)
nRBC: 0 % (ref 0.0–0.2)

## 2023-12-04 LAB — URINALYSIS, ROUTINE W REFLEX MICROSCOPIC
Bilirubin Urine: NEGATIVE
Glucose, UA: 50 mg/dL — AB
Hgb urine dipstick: NEGATIVE
Ketones, ur: 5 mg/dL — AB
Leukocytes,Ua: NEGATIVE
Nitrite: NEGATIVE
Protein, ur: NEGATIVE mg/dL
Specific Gravity, Urine: 1.024 (ref 1.005–1.030)
pH: 5 (ref 5.0–8.0)

## 2023-12-04 LAB — LIPASE, BLOOD: Lipase: 39 U/L (ref 11–51)

## 2023-12-04 MED ORDER — METHOCARBAMOL 500 MG PO TABS: 500.0000 mg | ORAL_TABLET | Freq: Two times a day (BID) | ORAL | 0 refills | Status: DC

## 2023-12-04 MED ORDER — ACETAMINOPHEN 325 MG PO TABS
650.0000 mg | ORAL_TABLET | Freq: Once | ORAL | Status: AC
  Administered 2023-12-04: 650 mg via ORAL
  Filled 2023-12-04: qty 2

## 2023-12-04 MED ORDER — METHOCARBAMOL 500 MG PO TABS
500.0000 mg | ORAL_TABLET | Freq: Once | ORAL | Status: AC
  Administered 2023-12-04: 500 mg via ORAL
  Filled 2023-12-04: qty 1

## 2023-12-04 NOTE — Discharge Instructions (Signed)
Thankfully nothing is broken today.  They do show some signs of pneumonia on the left side which you have already been treated for and since you are getting better you will just need to have a repeat x-ray by your doctor in about 4-6 weeks.  You can use the muscle relaxer as needed but also get some lidocaine patches or Salonpas patches to put on your back where you are having the pain.  It is okay to take Tylenol every 6 hours as well.  If you start having new fever, shortness of breath, passing out or other concerns return to the emergency room or follow-up with your doctor

## 2023-12-04 NOTE — ED Notes (Signed)
Patient transported to CT 

## 2023-12-04 NOTE — ED Provider Notes (Signed)
[facility] Provider Note   CSN: [id number] Arrival date & time: 12/04/23  1342     History  Chief Complaint  Patient presents with   Flank Pain    Vincent Collins is a 82 y.o. male.  Patient is an 82 year old male with a history of hypertension, hyperlipidemia, COPD, diabetes, renal stones and GERD who is presenting today with complaints of left-sided back pain that has been present for about a week now.  He reports that it the end of November 09 2223 he was having URI symptoms that persisted and was concerned he may be getting pneumonia.  He followed up at his doctor's office and reports he got a shot and antibiotics.  States overall that is gotten better but with some of the coughing he started getting a severe pain in his left side.  Now he reports with certain movements sitting laughing, coughing the pain is so severe it makes him double over at times.  He reports no shortness of breath and overall his URI symptoms have significantly improved.  He denies any abdominal pain or vomiting.  He has been able to eat okay.  He has not had any trouble walking denies any urinary symptoms  The history is provided by the patient and the spouse.  Flank Pain       Home Medications Prior to Admission medications   Medication Sig Start Date End Date Taking? Authorizing Provider  methocarbamol (ROBAXIN) 500 MG tablet Take 1 tablet (500 mg total) by mouth 2 (two) times daily. 12/04/23  Yes Gwyneth Sprout, MD  acetaminophen (TYLENOL) 325 MG tablet Take 2 tablets (650 mg total) by mouth every 6 (six) hours as needed for mild pain (or Fever >/= 101). 08/04/20   Meuth, Brooke A, PA-C  albuterol (PROVENTIL) (2.5 MG/3ML) 0.083% nebulizer solution USE 1 VIAL VIA NEBULIZER EVERY 6 HOURS AS NEEDED FOR WHEEZING OR  SHORTNESS OF BREATH 09/10/23   Dettinger, Elige Radon, MD  amoxicillin-clavulanate (AUGMENTIN) 875-125 MG tablet Take 1 tablet by mouth 2 (two) times  daily. 11/20/23   Dettinger, Elige Radon, MD  apixaban (ELIQUIS) 5 MG TABS tablet Take 1 tablet (5 mg total) by mouth 2 (two) times daily. 05/18/23   Sande Rives, MD  atorvastatin (LIPITOR) 40 MG tablet TAKE 1 TABLET BY MOUTH DAILY 05/22/23   Dettinger, Elige Radon, MD  Blood Glucose Monitoring Suppl (ACCU-CHEK GUIDE) w/Device KIT TEST BLOOD SUGAR DAILY; DX: E11.9. 06/05/22   Dettinger, Elige Radon, MD  Cholecalciferol (VITAMIN D) 1000 UNITS capsule Take 1,000 Units by mouth in the morning.    [provider]  Coenzyme Q10-Fish Oil-Vit E (CO-Q 10 OMEGA-3 FISH OIL) CAPS Take 1 capsule by mouth in the morning. 100mg  Capsule Qunol    [provider]  dapagliflozin propanediol (FARXIGA) 10 MG TABS tablet Take 1 tablet (10 mg total) by mouth daily before breakfast. 06/05/23   Dettinger, Elige Radon, MD  fluticasone (FLONASE) 50 MCG/ACT nasal spray USE 1 SPRAY IN BOTH  NOSTRILS ONCE DAILY 12/08/22   Dettinger, Elige Radon, MD  glucose blood (ACCU-CHEK GUIDE) test strip TEST BLOOD SUGAR DAILY; DX: E11.9. 06/05/22   Dettinger, Elige Radon, MD  guaiFENesin (MUCINEX) 600 MG 12 hr tablet Take 1 tablet (600 mg total) by mouth 2 (two) times daily. 01/05/23   Daryll Drown, NP  lisinopril (ZESTRIL) 40 MG tablet Take 1 tablet by mouth once daily 09/08/23   O'Neal, Ronnald Ramp, MD  meclizine Lezlie Octave)  25 MG tablet Take 1 tablet (25 mg total) by mouth 3 (three) times daily as needed for dizziness. 05/18/23   Sande Rives, MD  metFORMIN (GLUCOPHAGE-XR) 500 MG 24 hr tablet TAKE 1 TABLET BY MOUTH TWICE  DAILY 05/22/23   Dettinger, Elige Radon, MD  metoprolol succinate (TOPROL-XL) 50 MG 24 hr tablet TAKE 1 TABLET BY MOUTH DAILY  WITH OR IMMEDIATELY FOLLOWING A  MEAL 05/18/23   O'Neal, Ronnald Ramp, MD  Microlet Lancets MISC USE TO TEST BLOOD SUGAR DAILY; DX: E11.9. PATIENT HAS GLUCOMETER AND LANCING DEVICE 06/05/22   Dettinger, Elige Radon, MD  omeprazole (PRILOSEC) 20 MG capsule TAKE 1 CAPSULE BY MOUTH DAILY 05/22/23    Dettinger, Elige Radon, MD  predniSONE (DELTASONE) 20 MG tablet 2 po at same time daily for 5 days 11/20/23   Dettinger, Elige Radon, MD  spironolactone (ALDACTONE) 25 MG tablet Take 1 tablet (25 mg total) by mouth daily. 05/18/23 08/16/23  Sande Rives, MD  tamsulosin (FLOMAX) 0.4 MG CAPS capsule TAKE 2 CAPSULES BY MOUTH DAILY  AFTER SUPPER 11/18/23   Dettinger, Elige Radon, MD      Allergies    Sulfa antibiotics    Review of Systems   Review of Systems  Genitourinary:  Positive for flank pain.    Physical Exam Updated Vital Signs BP (!) 148/70 (BP Location: Right Arm)   Pulse 62   Temp 97.9 F (36.6 C)   Resp 20   Ht 5\' 8"  (1.727 m)   Wt 100.7 kg   SpO2 94%   BMI 33.75 kg/m  Physical Exam Vitals and nursing note reviewed.  Constitutional:      General: He is not in acute distress.    Appearance: He is well-developed.  HENT:     Head: Normocephalic and atraumatic.  Eyes:     Conjunctiva/sclera: Conjunctivae normal.     Pupils: Pupils are equal, round, and reactive to light.  Cardiovascular:     Rate and Rhythm: Normal rate and regular rhythm.     Heart sounds: No murmur heard. Pulmonary:     Effort: Pulmonary effort is normal. No respiratory distress.     Breath sounds: Normal breath sounds. No wheezing or rales.       Comments: Tenderness with palpation in the lower ribs Chest:     Chest wall: Tenderness present.  Abdominal:     General: There is no distension.     Palpations: Abdomen is soft.     Tenderness: There is no abdominal tenderness. There is left CVA tenderness. There is no right CVA tenderness, guarding or rebound.     Comments: Significant flank tenderness on the left without bruising or rash  Musculoskeletal:        General: No tenderness. Normal range of motion.     Cervical back: Normal range of motion and neck supple.     Right lower leg: No edema.     Left lower leg: No edema.  Skin:    General: Skin is warm and dry.     Findings: No  erythema or rash.     Comments: No rashes present  Neurological:     Mental Status: He is alert and oriented to person, place, and time.  Psychiatric:        Behavior: Behavior normal.     ED Results / Procedures / Treatments   Labs (all labs ordered are listed, but only abnormal results are displayed) Labs Reviewed  CBC WITH DIFFERENTIAL/PLATELET - Abnormal;  Notable for the following components:      Result Value   WBC 11.5 (*)    Neutro Abs 8.4 (*)    Monocytes Absolute 1.1 (*)    All other components within normal limits  COMPREHENSIVE METABOLIC PANEL - Abnormal; Notable for the following components:   Glucose, Bld 135 (*)    Calcium 8.6 (*)    Total Protein 5.9 (*)    Albumin 2.8 (*)    All other components within normal limits  URINALYSIS, ROUTINE W REFLEX MICROSCOPIC - Abnormal; Notable for the following components:   Color, Urine AMBER (*)    Glucose, UA 50 (*)    Ketones, ur 5 (*)    All other components within normal limits  LIPASE, BLOOD    EKG None  Radiology CT Chest Wo Contrast  Result Date: 12/04/2023 CLINICAL DATA:  Left flank pain for several weeks worsening over the past few days, initial encounter EXAM: CT CHEST, ABDOMEN AND PELVIS WITHOUT CONTRAST TECHNIQUE: Multidetector CT imaging of the chest, abdomen and pelvis was performed following the standard protocol without IV contrast. RADIATION DOSE REDUCTION: This exam was performed according to the departmental dose-optimization program which includes automated exposure control, adjustment of the mA and/or kV according to patient size and/or use of iterative reconstruction technique. COMPARISON:  Chest x-ray from earlier in the same day, CT from 09/23/2023 FINDINGS: CT CHEST FINDINGS Cardiovascular: Somewhat limited due to lack of IV contrast. Atherosclerotic calcifications of the thoracic aorta are noted. Stable dilatation of the ascending aorta to 4.4 cm is noted. No hyperdense crescent to suggest acute  aortic injury is seen. Coronary calcifications are noted. No cardiac enlargement is seen. Mediastinum/Nodes: Thoracic inlet is within normal limits. No hilar or mediastinal adenopathy is noted. The esophagus as visualized is within normal limits. Lungs/Pleura: Lungs are well aerated bilaterally. Some scarring is noted in the right middle lobe. A small left-sided pleural effusion is noted. In the lingula and left upper lobe there are is some areas of consolidation with air bronchograms within suspicious for round pneumonia. This is new from the prior CT examination from September of 2024. No other airspace consolidation is noted. Musculoskeletal: Degenerative changes of the thoracic spine are noted. No acute rib abnormality is seen. CT ABDOMEN PELVIS FINDINGS Hepatobiliary: No focal liver abnormality is seen. Status post cholecystectomy. No biliary dilatation. Pancreas: Unremarkable. No pancreatic ductal dilatation or surrounding inflammatory changes. Spleen: Normal in size without focal abnormality. Adrenals/Urinary Tract: Adrenal glands are within normal limits. Kidneys are well visualized bilaterally. No renal calculi or obstructive changes are noted. Cystic changes are noted within the kidneys bilaterally. These are stable from a prior MRI from 2021 and no further follow-up is recommended. The bladder is decompressed Stomach/Bowel: Diverticular change of the sigmoid colon is noted without evidence of diverticulitis. No obstructive or inflammatory changes of the colon are seen. The appendix is within normal limits. Stomach is within normal limits. Duodenal diverticulum is noted adjacent to the uncinate process of the pancreas. A knuckle of small bowel extends into an umbilical hernia although no incarceration is identified. Vascular/Lymphatic: Aortic atherosclerosis. No enlarged abdominal or pelvic lymph nodes. Reproductive: Prostate is unremarkable. Other: No abdominal wall hernia or abnormality. No  abdominopelvic ascites. Musculoskeletal: Multilevel degenerative changes of lumbar spine are noted. IMPRESSION: CT of the chest: Stable dilatation of the ascending aorta to 4.4 cm. Recommend annual imaging followup by CTA or MRA. This recommendation follows 2010 ACCF/AHA/AATS/ACR/ASA/SCA/SCAI/SIR/STS/SVM Guidelines for the Diagnosis and Management of Patients with  Thoracic Aortic Disease. Circulation. 2010; 121: Z610-R604. Aortic aneurysm NOS (ICD10-I71.9) Rounded areas of consolidation in the left upper lobe and lingula with air bronchograms consistent with focal pneumonia. Associated small effusion is noted. Corresponds to the chest x-ray findings. CT of the abdomen and pelvis: Diverticulosis without diverticulitis. Knuckle of small bowel within an umbilical hernia although no evidence of incarceration is noted. Electronically Signed   By: Alcide Clever M.D.   On: 12/04/2023 20:23   CT Renal Stone Study  Result Date: 12/04/2023 CLINICAL DATA:  Left flank pain for several weeks worsening over the past few days, initial encounter EXAM: CT CHEST, ABDOMEN AND PELVIS WITHOUT CONTRAST TECHNIQUE: Multidetector CT imaging of the chest, abdomen and pelvis was performed following the standard protocol without IV contrast. RADIATION DOSE REDUCTION: This exam was performed according to the departmental dose-optimization program which includes automated exposure control, adjustment of the mA and/or kV according to patient size and/or use of iterative reconstruction technique. COMPARISON:  Chest x-ray from earlier in the same day, CT from 09/23/2023 FINDINGS: CT CHEST FINDINGS Cardiovascular: Somewhat limited due to lack of IV contrast. Atherosclerotic calcifications of the thoracic aorta are noted. Stable dilatation of the ascending aorta to 4.4 cm is noted. No hyperdense crescent to suggest acute aortic injury is seen. Coronary calcifications are noted. No cardiac enlargement is seen. Mediastinum/Nodes: Thoracic inlet is  within normal limits. No hilar or mediastinal adenopathy is noted. The esophagus as visualized is within normal limits. Lungs/Pleura: Lungs are well aerated bilaterally. Some scarring is noted in the right middle lobe. A small left-sided pleural effusion is noted. In the lingula and left upper lobe there are is some areas of consolidation with air bronchograms within suspicious for round pneumonia. This is new from the prior CT examination from September of 2024. No other airspace consolidation is noted. Musculoskeletal: Degenerative changes of the thoracic spine are noted. No acute rib abnormality is seen. CT ABDOMEN PELVIS FINDINGS Hepatobiliary: No focal liver abnormality is seen. Status post cholecystectomy. No biliary dilatation. Pancreas: Unremarkable. No pancreatic ductal dilatation or surrounding inflammatory changes. Spleen: Normal in size without focal abnormality. Adrenals/Urinary Tract: Adrenal glands are within normal limits. Kidneys are well visualized bilaterally. No renal calculi or obstructive changes are noted. Cystic changes are noted within the kidneys bilaterally. These are stable from a prior MRI from 2021 and no further follow-up is recommended. The bladder is decompressed Stomach/Bowel: Diverticular change of the sigmoid colon is noted without evidence of diverticulitis. No obstructive or inflammatory changes of the colon are seen. The appendix is within normal limits. Stomach is within normal limits. Duodenal diverticulum is noted adjacent to the uncinate process of the pancreas. A knuckle of small bowel extends into an umbilical hernia although no incarceration is identified. Vascular/Lymphatic: Aortic atherosclerosis. No enlarged abdominal or pelvic lymph nodes. Reproductive: Prostate is unremarkable. Other: No abdominal wall hernia or abnormality. No abdominopelvic ascites. Musculoskeletal: Multilevel degenerative changes of lumbar spine are noted. IMPRESSION: CT of the chest: Stable  dilatation of the ascending aorta to 4.4 cm. Recommend annual imaging followup by CTA or MRA. This recommendation follows 2010 ACCF/AHA/AATS/ACR/ASA/SCA/SCAI/SIR/STS/SVM Guidelines for the Diagnosis and Management of Patients with Thoracic Aortic Disease. Circulation. 2010; 121: V409-W119. Aortic aneurysm NOS (ICD10-I71.9) Rounded areas of consolidation in the left upper lobe and lingula with air bronchograms consistent with focal pneumonia. Associated small effusion is noted. Corresponds to the chest x-ray findings. CT of the abdomen and pelvis: Diverticulosis without diverticulitis. Knuckle of small bowel within an umbilical hernia  although no evidence of incarceration is noted. Electronically Signed   By: Alcide Clever M.D.   On: 12/04/2023 20:23   DG Chest 2 View  Result Date: 12/04/2023 CLINICAL DATA:  Short of breath, EXAM: CHEST - 2 VIEW COMPARISON:  CT 09/23/2023, chest xray 01/05/2023 FINDINGS: Atelectasis right base. Small left pleural effusion. Ovoid opacity left midlung, possible loculated pleural fluid given no lung nodule on CT from September. Cardiomegaly. IMPRESSION: 1. Small left pleural effusion. Ovoid opacity left midlung, possible loculated pleural fluid vs nodule, suggest correlation with chest CT 2. Atelectasis right base. Electronically Signed   By: Jasmine Pang M.D.   On: 12/04/2023 15:42    Procedures Procedures    Medications Ordered in ED Medications  acetaminophen (TYLENOL) tablet 650 mg (650 mg Oral Given 12/04/23 1922)  methocarbamol (ROBAXIN) tablet 500 mg (500 mg Oral Given 12/04/23 1922)    ED Course/ Medical Decision Making/ A&P                                 Medical Decision Making Amount and/or Complexity of Data Reviewed Independent Historian: spouse Labs: ordered. Decision-making details documented in ED Course. Radiology: ordered and independent interpretation performed. Decision-making details documented in ED Course.  Risk OTC drugs. Prescription drug  management.   Pt with multiple medical problems and comorbidities and presenting today with a complaint that caries a high risk for morbidity and mortality.  Here with persistent pain in the left lower ribs and flank area.  This is in the setting of having recent URI and a cough.  Concern for rib fractures versus pneumonia versus possible kidney stone versus muscle spasm.  Patient overall is well-appearing and vital signs are reassuring.  No evidence to suggest shingles.  Pain can be reproduced with patient laughing and certain movements.  I independently interpreted patient's labs and CBC with minimal leukocytosis of 11.5 but normal hemoglobin, CMP with normal creatinine sodium and potassium and lipase within normal limits.  I have independently visualized and interpreted pt's images today. Chest x-ray today with a small pleural effusion on the left but no obvious fractures.  Radiology reports ovoid opacity in the left midlung which could be a nodule versus pleural fluid and recommended a CT of the chest.  Will do a CT of the chest as well as renal stone study to rule out above.  Patient given Tylenol and Robaxin.  9:46 PM I have independently visualized and interpreted pt's images today.  CT today shows no sign of renal stones or pneumothorax.  Radiology reports a stable dilation of the ascending aorta that needs annual follow-up as well as consolidation in the left upper lobe and lingula with air bronchograms consistent with a focal pneumonia but no evidence of fracture.  Patient has already been treated with antibiotics and reports ultimately symptoms are improving.  He is not complaining of infectious symptoms now and do not feel that he needs further antibiotics but will need a follow-up x-ray in 4 to 6 weeks to ensure this is all resolved.  He feels much better after Tylenol and Robaxin.  Will give him a short course of this to take at home but also encouraged Salonpas or lidocaine patches.  He and his  wife are comfortable with this plan.  No social barriers affecting his discharge today          Final Clinical Impression(s) / ED Diagnoses Final diagnoses:  Strain  of thoracic paraspinal muscles excluding T1 and T2 levels, initial encounter    Rx / DC Orders ED Discharge Orders          Ordered    methocarbamol (ROBAXIN) 500 MG tablet  2 times daily        12/04/23 2143              Gwyneth Sprout, MD 12/04/23 2146

## 2023-12-04 NOTE — ED Provider Triage Note (Signed)
Emergency Medicine Provider Triage Evaluation Note  Vincent Collins , a 82 y.o. male  was evaluated in triage.  Pt complains of left sided flank pain on going for the last couple of week, worse over the past 2 days. Also reports pneumonia diagnosed at outside hospital. Reports shortness of breath and black stools.  Review of Systems  Positive: As above Negative: As above  Physical Exam  BP (!) 142/77 (BP Location: Right Arm)   Pulse 74   Temp (!) 97.4 F (36.3 C) (Oral)   Resp 16   Ht 5\' 8"  (1.727 m)   Wt 100.7 kg   SpO2 91%   BMI 33.75 kg/m  Gen:   Awake, no distress   Resp:  Normal effort  MSK:   Moves extremities without difficulty    Medical Decision Making  Medically screening exam initiated at 2:28 PM.  Appropriate orders placed.  [name] was informed that the remainder of the evaluation will be completed by another provider, this initial triage assessment does not replace that evaluation, and the importance of remaining in the ED until their evaluation is complete.     Arabella Merles, PA-C 12/04/23 1428

## 2023-12-04 NOTE — ED Triage Notes (Signed)
Pt states he has had left flank pain for past few weeks and has worsened over past 2 days. Pt was seen at OSH and diagnosed with pna. Pt states he has also had shortness of breath and Vanisha Whiten stools. Pt states he has also been coughing and spitting up blood. Pt denies nausea/vomiting.

## 2023-12-07 ENCOUNTER — Telehealth: Payer: Self-pay

## 2023-12-07 NOTE — Transitions of Care (Post Inpatient/ED Visit) (Signed)
 12/07/2023  Name: Vincent Collins MRN: [id number] DOB: [birth year]  Today's TOC FU Call Status:   Patient's Name and Date of Birth confirmed.  Transition Care Management Follow-up Telephone Call Date of Discharge: 12/04/23 Discharge Facility: [facility]) Type of Discharge: Emergency Department Reason for ED Visit: Other: (strain of muscle and tendon of back wall of thorax) How have you been since you were released from the hospital?: Better Any questions or concerns?: No  Items Reviewed: Did you receive and understand the discharge instructions provided?: Yes Medications obtained,verified, and reconciled?: Yes (Medications Reviewed) Any new allergies since your discharge?: No Dietary orders reviewed?: NA Do you have support at home?: Yes People in Home: spouse  Medications Reviewed Today: Medications Reviewed Today     Reviewed by Saban Heinlen, Jordan Hawks, CMA (Certified Medical Assistant) on 12/07/23 at 1647  Med List Status: <None>   Medication Order Taking? Sig Documenting Provider Last Dose Status Informant  acetaminophen (TYLENOL) 325 MG tablet [id number] No Take 2 tablets (650 mg total) by mouth every 6 (six) hours as needed for mild pain (or Fever >/= 101). Meuth, Brooke A, PA-C Taking Active   albuterol (PROVENTIL) (2.5 MG/3ML) 0.083% nebulizer solution [id number] No USE 1 VIAL VIA NEBULIZER EVERY 6 HOURS AS NEEDED FOR WHEEZING OR  SHORTNESS OF BREATH Dettinger, Elige Radon, MD Taking Active   amoxicillin-clavulanate (AUGMENTIN) 875-125 MG tablet [id number]  Take 1 tablet by mouth 2 (two) times daily. Dettinger, Elige Radon, MD  Active   apixaban (ELIQUIS) 5 MG TABS tablet [id number] No Take 1 tablet (5 mg total) by mouth 2 (two) times daily. Sande Rives, MD Taking Active   atorvastatin (LIPITOR) 40 MG tablet [id number] No TAKE 1 TABLET BY MOUTH DAILY Dettinger, Elige Radon, MD Taking Active   Blood Glucose Monitoring Suppl (ACCU-CHEK GUIDE) w/Device KIT [id number] No TEST BLOOD  SUGAR DAILY; DX: E11.9. Dettinger, Elige Radon, MD Taking Active   Cholecalciferol (VITAMIN D) 1000 UNITS capsule [id number] No Take 1,000 Units by mouth in the morning. [provider] Taking Active Self  Coenzyme Q10-Fish Oil-Vit E (CO-Q 10 OMEGA-3 FISH OIL) CAPS [id number] No Take 1 capsule by mouth in the morning. 100mg  Capsule Qunol [provider] Taking Active Self  dapagliflozin propanediol (FARXIGA) 10 MG TABS tablet [id number] No Take 1 tablet (10 mg total) by mouth daily before breakfast. Dettinger, Elige Radon, MD Taking Active   fluticasone (FLONASE) 50 MCG/ACT nasal spray [id number] No USE 1 SPRAY IN BOTH  NOSTRILS ONCE DAILY Dettinger, Elige Radon, MD Taking Active   glucose blood (ACCU-CHEK GUIDE) test strip [id number] No TEST BLOOD SUGAR DAILY; DX: E11.9. Dettinger, Elige Radon, MD Taking Active   guaiFENesin (MUCINEX) 600 MG 12 hr tablet [id number] No Take 1 tablet (600 mg total) by mouth 2 (two) times daily. Daryll Drown, NP Taking Active   lisinopril (ZESTRIL) 40 MG tablet [id number] No Take 1 tablet by mouth once daily O'Neal, Ronnald Ramp, MD Taking Active   meclizine (ANTIVERT) 25 MG tablet [id number] No Take 1 tablet (25 mg total) by mouth 3 (three) times daily as needed for dizziness. Sande Rives, MD Taking Active   metFORMIN (GLUCOPHAGE-XR) 500 MG 24 hr tablet [id number] No TAKE 1 TABLET BY MOUTH TWICE  DAILY Dettinger, Elige Radon, MD Taking Active   methocarbamol (ROBAXIN) 500 MG tablet [id number]  Take 1 tablet (500 mg total) by mouth 2 (two) times daily. Gwyneth Sprout, MD  Active   metoprolol succinate (TOPROL-XL) 50 MG 24 hr tablet  [id number] No TAKE 1 TABLET BY MOUTH DAILY  WITH OR IMMEDIATELY FOLLOWING A  MEAL O'Neal, Ronnald Ramp, MD Taking Active   Microlet Lancets MISC [id number] No USE TO TEST BLOOD SUGAR DAILY; DX: E11.9. PATIENT HAS GLUCOMETER AND LANCING DEVICE Dettinger, Elige Radon, MD Taking Active   omeprazole (PRILOSEC) 20 MG capsule [id number] No TAKE  1 CAPSULE BY MOUTH DAILY Dettinger, Elige Radon, MD Taking Active   predniSONE (DELTASONE) 20 MG tablet [id number]  2 po at same time daily for 5 days Dettinger, Elige Radon, MD  Active   spironolactone (ALDACTONE) 25 MG tablet [id number] No Take 1 tablet (25 mg total) by mouth daily. Sande Rives, MD Taking Expired 08/16/23 2359   tamsulosin (FLOMAX) 0.4 MG CAPS capsule [id number] No TAKE 2 CAPSULES BY MOUTH DAILY  AFTER SUPPER Dettinger, Elige Radon, MD Taking Active             Home Care and Equipment/Supplies: Were Home Health Services Ordered?: NA Any new equipment or medical supplies ordered?: NA  Functional Questionnaire: Do you need assistance with bathing/showering or dressing?: No Do you need assistance with meal preparation?: No Do you need assistance with eating?: No Do you have difficulty maintaining continence: No Do you need assistance with getting out of bed/getting out of a chair/moving?: No Do you have difficulty managing or taking your medications?: No  Follow up appointments reviewed: PCP Follow-up appointment confirmed?: Yes Date of PCP follow-up appointment?: 12/10/23 Follow-up Provider: [facility] Follow-up appointment confirmed?: NA Do you need transportation to your follow-up appointment?: No Do you understand care options if your condition(s) worsen?: Yes-patient verbalized understanding     Shulamis Wenberg, CMA  [facility] AWV Team Direct Dial: [phone]

## 2023-12-09 ENCOUNTER — Inpatient Hospital Stay: Payer: Medicare Other | Admitting: Family Medicine

## 2023-12-11 ENCOUNTER — Ambulatory Visit (INDEPENDENT_AMBULATORY_CARE_PROVIDER_SITE_OTHER): Payer: Medicare Other | Admitting: Family Medicine

## 2023-12-11 ENCOUNTER — Encounter: Payer: Self-pay | Admitting: Family Medicine

## 2023-12-11 VITALS — BP 149/78 | HR 82 | Ht 68.0 in | Wt 222.0 lb

## 2023-12-11 DIAGNOSIS — J988 Other specified respiratory disorders: Secondary | ICD-10-CM

## 2023-12-11 DIAGNOSIS — R0789 Other chest pain: Secondary | ICD-10-CM | POA: Diagnosis not present

## 2023-12-11 DIAGNOSIS — Z7984 Long term (current) use of oral hypoglycemic drugs: Secondary | ICD-10-CM | POA: Diagnosis not present

## 2023-12-11 DIAGNOSIS — E785 Hyperlipidemia, unspecified: Secondary | ICD-10-CM | POA: Diagnosis not present

## 2023-12-11 DIAGNOSIS — I10 Essential (primary) hypertension: Secondary | ICD-10-CM | POA: Diagnosis not present

## 2023-12-11 DIAGNOSIS — E1169 Type 2 diabetes mellitus with other specified complication: Secondary | ICD-10-CM

## 2023-12-11 LAB — BAYER DCA HB A1C WAIVED: HB A1C (BAYER DCA - WAIVED): 7.3 % — ABNORMAL HIGH (ref 4.8–5.6)

## 2023-12-11 NOTE — Progress Notes (Signed)
BP (!) 149/78   Pulse 82   Ht 5\' 8"  (1.727 m)   Wt 222 lb (100.7 kg)   SpO2 93%   BMI 33.75 kg/m    Subjective:   Patient ID: Vincent Collins, male    DOB: Feb 28, 1941, 82 y.o.   MRN: [id number]  HPI: Vincent Collins is a 82 y.o. male presenting on 12/11/2023 for Hospitalization Follow-up (pneumonia)   HPI Hospital follow-up/transition of care Patient was contacted on 12/07/2023 by Regina Eck, CMA for transition of care phone call. Patient was in the emergency department on 12/04/2023.  He was in the emergency department for left-sided chest wall pain that he was having a lot of coughing and congestion and treated for a URI/possible pneumonia.  The pain was occurring with coughing and laughing and certain movements.  The pain was posterior that sided chest wall pain.  He was given a muscle relaxer from the emergency department.  He says since leaving the emergency department now he says he is chest wall pain is doing good and his cough is doing much better.  He still has a mild cough but it is much better normal is back to his normal.  He says he is feeling like everything is doing well and is ready go back to work.  He wants a note to go back to work on the 23rd.  He denies any shortness of breath or fevers or chills or bodyaches.  Relevant past medical, surgical, family and social history reviewed and updated as indicated. Interim medical history since our last visit reviewed. Allergies and medications reviewed and updated.  Review of Systems  Constitutional:  Negative for chills and fever.  Eyes:  Negative for discharge.  Respiratory:  Negative for shortness of breath and wheezing.   Cardiovascular:  Negative for chest pain and leg swelling.  Musculoskeletal:  Negative for back pain and gait problem.  Skin:  Negative for rash.  All other systems reviewed and are negative.   Per HPI unless specifically indicated above   Allergies as of 12/11/2023       Reactions   Sulfa  Antibiotics Other (See Comments)   Unsure of reaction        Medication List        Accurate as of December 11, 2023  9:19 AM. If you have any questions, ask your nurse or doctor.          STOP taking these medications    amoxicillin-clavulanate 875-125 MG tablet Commonly known as: AUGMENTIN Stopped by: Elige Radon Zadiel Leyh   methocarbamol 500 MG tablet Commonly known as: ROBAXIN Stopped by: Elige Radon Amarion Portell   predniSONE 20 MG tablet Commonly known as: DELTASONE Stopped by: Elige Radon Shatira Dobosz       TAKE these medications    Accu-Chek Guide test strip Generic drug: glucose blood TEST BLOOD SUGAR DAILY; DX: E11.9.   Accu-Chek Guide w/Device Kit TEST BLOOD SUGAR DAILY; DX: E11.9.   acetaminophen 325 MG tablet Commonly known as: TYLENOL Take 2 tablets (650 mg total) by mouth every 6 (six) hours as needed for mild pain (or Fever >/= 101).   albuterol (2.5 MG/3ML) 0.083% nebulizer solution Commonly known as: PROVENTIL USE 1 VIAL VIA NEBULIZER EVERY 6 HOURS AS NEEDED FOR WHEEZING OR  SHORTNESS OF BREATH   apixaban 5 MG Tabs tablet Commonly known as: Eliquis Take 1 tablet (5 mg total) by mouth 2 (two) times daily.   atorvastatin 40 MG tablet Commonly known as: LIPITOR  TAKE 1 TABLET BY MOUTH DAILY   CO-Q 10 Omega-3 Fish Oil Caps Take 1 capsule by mouth in the morning. 100mg  Capsule Qunol   dapagliflozin propanediol 10 MG Tabs tablet Commonly known as: Farxiga Take 1 tablet (10 mg total) by mouth daily before breakfast.   fluticasone 50 MCG/ACT nasal spray Commonly known as: FLONASE USE 1 SPRAY IN BOTH  NOSTRILS ONCE DAILY   guaiFENesin 600 MG 12 hr tablet Commonly known as: Mucinex Take 1 tablet (600 mg total) by mouth 2 (two) times daily.   lisinopril 40 MG tablet Commonly known as: ZESTRIL Take 1 tablet by mouth once daily   meclizine 25 MG tablet Commonly known as: ANTIVERT Take 1 tablet (25 mg total) by mouth 3 (three) times daily as needed  for dizziness.   metFORMIN 500 MG 24 hr tablet Commonly known as: GLUCOPHAGE-XR TAKE 1 TABLET BY MOUTH TWICE  DAILY   metoprolol succinate 50 MG 24 hr tablet Commonly known as: TOPROL-XL TAKE 1 TABLET BY MOUTH DAILY  WITH OR IMMEDIATELY FOLLOWING A  MEAL   Microlet Lancets Misc USE TO TEST BLOOD SUGAR DAILY; DX: E11.9. PATIENT HAS GLUCOMETER AND LANCING DEVICE   omeprazole 20 MG capsule Commonly known as: PRILOSEC TAKE 1 CAPSULE BY MOUTH DAILY   spironolactone 25 MG tablet Commonly known as: ALDACTONE Take 1 tablet (25 mg total) by mouth daily.   tamsulosin 0.4 MG Caps capsule Commonly known as: FLOMAX TAKE 2 CAPSULES BY MOUTH DAILY  AFTER SUPPER   Vitamin D 1000 units capsule Take 1,000 Units by mouth in the morning.         Objective:   BP (!) 149/78   Pulse 82   Ht 5\' 8"  (1.727 m)   Wt 222 lb (100.7 kg)   SpO2 93%   BMI 33.75 kg/m   Wt Readings from Last 3 Encounters:  12/11/23 222 lb (100.7 kg)  12/04/23 222 lb (100.7 kg)  11/20/23 226 lb (102.5 kg)    Physical Exam Vitals and nursing note reviewed.  Constitutional:      General: He is not in acute distress.    Appearance: He is well-developed. He is not diaphoretic.  Eyes:     General: No scleral icterus.    Conjunctiva/sclera: Conjunctivae normal.  Neck:     Thyroid: No thyromegaly.  Cardiovascular:     Rate and Rhythm: Normal rate and regular rhythm.     Heart sounds: Normal heart sounds. No murmur heard. Pulmonary:     Effort: Pulmonary effort is normal. No respiratory distress.     Breath sounds: Normal breath sounds. No wheezing, rhonchi or rales.  Chest:     Chest wall: No tenderness.  Musculoskeletal:        General: No swelling. Normal range of motion.     Cervical back: Neck supple.  Lymphadenopathy:     Cervical: No cervical adenopathy.  Skin:    General: Skin is warm and dry.     Findings: No rash.  Neurological:     Mental Status: He is alert and oriented to person, place, and  time.     Coordination: Coordination normal.  Psychiatric:        Behavior: Behavior normal.       Assessment & Plan:   Problem List Items Addressed This Visit       Cardiovascular and Mediastinum   Essential hypertension   Relevant Orders   CMP14+EGFR   Lipid panel   Bayer DCA Hb A1c Waived  Endocrine   Hyperlipidemia associated with type 2 diabetes mellitus (HCC)   Relevant Orders   CMP14+EGFR   Lipid panel   Bayer DCA Hb A1c Waived   Type 2 diabetes mellitus (HCC)   Relevant Orders   CMP14+EGFR   Lipid panel   Bayer DCA Hb A1c Waived   Other Visit Diagnoses       Chest wall pain    -  Primary     Respiratory infection           Seems to be doing much better with his pneumonia and chest wall pain.  Cleared to go back to Work on the 31st  Follow up plan: Return in about 3 months (around 03/10/2024), or if symptoms worsen or fail to improve.  Counseling provided for all of the vaccine components Orders Placed This Encounter  Procedures   CMP14+EGFR   Lipid panel   Bayer DCA Hb A1c Waived    Arville Care, MD [facility] 12/11/2023, 9:19 AM

## 2023-12-12 LAB — CMP14+EGFR
ALT: 14 [IU]/L (ref 0–44)
AST: 18 [IU]/L (ref 0–40)
Albumin: 3.7 g/dL (ref 3.7–4.7)
Alkaline Phosphatase: 99 [IU]/L (ref 44–121)
BUN/Creatinine Ratio: 12 (ref 10–24)
BUN: 12 mg/dL (ref 8–27)
Bilirubin Total: 1 mg/dL (ref 0.0–1.2)
CO2: 25 mmol/L (ref 20–29)
Calcium: 8.9 mg/dL (ref 8.6–10.2)
Chloride: 102 mmol/L (ref 96–106)
Creatinine, Ser: 1 mg/dL (ref 0.76–1.27)
Globulin, Total: 2.6 g/dL (ref 1.5–4.5)
Glucose: 132 mg/dL — ABNORMAL HIGH (ref 70–99)
Potassium: 4.6 mmol/L (ref 3.5–5.2)
Sodium: 142 mmol/L (ref 134–144)
Total Protein: 6.3 g/dL (ref 6.0–8.5)
eGFR: 76 mL/min/{1.73_m2} (ref >59)

## 2023-12-12 LAB — LIPID PANEL
Chol/HDL Ratio: 2.5 {ratio} (ref 0.0–5.0)
Cholesterol, Total: 110 mg/dL (ref 100–199)
HDL: 44 mg/dL (ref >39)
LDL Chol Calc (NIH): 48 mg/dL (ref 0–99)
Triglycerides: 90 mg/dL (ref 0–149)
VLDL Cholesterol Cal: 18 mg/dL (ref 5–40)

## 2023-12-31 ENCOUNTER — Ambulatory Visit: Payer: Medicare Other | Admitting: Family Medicine

## 2024-01-07 ENCOUNTER — Ambulatory Visit (INDEPENDENT_AMBULATORY_CARE_PROVIDER_SITE_OTHER): Payer: Medicare Other | Admitting: Family Medicine

## 2024-01-07 ENCOUNTER — Encounter: Payer: Self-pay | Admitting: Family Medicine

## 2024-01-07 VITALS — BP 146/76 | HR 84 | Temp 98.1°F | Ht 68.0 in | Wt 228.0 lb

## 2024-01-07 DIAGNOSIS — J0140 Acute pansinusitis, unspecified: Secondary | ICD-10-CM

## 2024-01-07 MED ORDER — CEFDINIR 300 MG PO CAPS
300.0000 mg | ORAL_CAPSULE | Freq: Two times a day (BID) | ORAL | 0 refills | Status: AC
Start: 2024-01-07 — End: 2024-01-14

## 2024-01-07 NOTE — Progress Notes (Signed)
 Acute Office Visit  Subjective:     Patient ID: Vincent Collins, male    DOB: Dec 28, 1941, 83 y.o.   MRN: [id number]  Chief Complaint  Patient presents with   Facial Pain    Cough This is a new problem. Episode onset: 1 week. The problem has been unchanged. The cough is Non-productive. Associated symptoms include headaches (facial pressure), nasal congestion and rhinorrhea. Pertinent negatives include no chest pain, chills, ear congestion, ear pain, fever, sore throat, shortness of breath or wheezing. His past medical history is significant for COPD and pneumonia (2 months ago- treated with augmentin ).    Review of Systems  Constitutional:  Negative for chills and fever.  HENT:  Positive for rhinorrhea. Negative for ear pain and sore throat.   Respiratory:  Positive for cough. Negative for shortness of breath and wheezing.   Cardiovascular:  Negative for chest pain.  Neurological:  Positive for headaches (facial pressure).        Objective:    BP (!) 146/76   Pulse 84   Temp 98.1 F (36.7 C) (Temporal)   Ht 5' 8 (1.727 m)   Wt 228 lb (103.4 kg)   SpO2 96%   BMI 34.67 kg/m    Physical Exam Vitals and nursing note reviewed.  Constitutional:      General: He is not in acute distress.    Appearance: He is not ill-appearing, toxic-appearing or diaphoretic.  HENT:     Right Ear: Tympanic membrane, ear canal and external ear normal.     Left Ear: Tympanic membrane, ear canal and external ear normal.     Nose: Congestion present.     Right Sinus: Maxillary sinus tenderness and frontal sinus tenderness present.     Left Sinus: Maxillary sinus tenderness and frontal sinus tenderness present.     Mouth/Throat:     Mouth: Mucous membranes are moist.     Pharynx: Oropharynx is clear. No oropharyngeal exudate.  Eyes:     General:        Right eye: No discharge.        Left eye: No discharge.     Conjunctiva/sclera: Conjunctivae normal.  Cardiovascular:     Rate and  Rhythm: Normal rate and regular rhythm.     Heart sounds: Normal heart sounds. No murmur heard. Pulmonary:     Effort: Pulmonary effort is normal. No respiratory distress.     Breath sounds: Normal breath sounds. No wheezing, rhonchi or rales.  Musculoskeletal:     Cervical back: Neck supple. No rigidity.  Lymphadenopathy:     Cervical: No cervical adenopathy.  Skin:    General: Skin is warm and dry.  Neurological:     General: No focal deficit present.     Mental Status: He is alert and oriented to person, place, and time.  Psychiatric:        Mood and Affect: Mood normal.        Behavior: Behavior normal.     No results found for any visits on 01/07/24.      Assessment & Plan:   Vincent Collins was seen today for facial pain.  Diagnoses and all orders for this visit:  Acute non-recurrent pansinusitis Cefdinir  as below since he was recently treated with Augmentin . Discussed symptomatic care and return precautions.  -     cefdinir  (OMNICEF ) 300 MG capsule; Take 1 capsule (300 mg total) by mouth 2 (two) times daily for 7 days. 1 po BID  Return to office  for new or worsening symptoms, or if symptoms persist.   The patient indicates understanding of these issues and agrees with the plan.  Annabella CHRISTELLA Search, FNP

## 2024-02-18 ENCOUNTER — Other Ambulatory Visit: Payer: Self-pay | Admitting: Family Medicine

## 2024-02-18 DIAGNOSIS — E1169 Type 2 diabetes mellitus with other specified complication: Secondary | ICD-10-CM

## 2024-02-18 DIAGNOSIS — K2950 Unspecified chronic gastritis without bleeding: Secondary | ICD-10-CM

## 2024-03-09 NOTE — Telephone Encounter (Unsigned)
 Copied from CRM [id number]. Topic: Clinical - Lab/Test Results >> Mar 09, 2024  8:15 AM Gildardo Pounds wrote: Reason for CRM: Patient wants labs run before appointment on Friday, 03/11/2024. Callback number is [phone]

## 2024-03-10 ENCOUNTER — Telehealth: Payer: Self-pay | Admitting: Family Medicine

## 2024-03-10 NOTE — Telephone Encounter (Signed)
 Pt informed that we will get a CBC and an A1C. He will wait until the day of the appt for labs

## 2024-03-11 ENCOUNTER — Encounter: Payer: Self-pay | Admitting: Family Medicine

## 2024-03-11 ENCOUNTER — Ambulatory Visit: Payer: Medicare Other | Admitting: Family Medicine

## 2024-03-11 VITALS — BP 174/77 | HR 64 | Ht 68.0 in | Wt 228.0 lb

## 2024-03-11 DIAGNOSIS — I10 Essential (primary) hypertension: Secondary | ICD-10-CM

## 2024-03-11 DIAGNOSIS — E785 Hyperlipidemia, unspecified: Secondary | ICD-10-CM

## 2024-03-11 DIAGNOSIS — I4819 Other persistent atrial fibrillation: Secondary | ICD-10-CM | POA: Diagnosis not present

## 2024-03-11 DIAGNOSIS — K2950 Unspecified chronic gastritis without bleeding: Secondary | ICD-10-CM

## 2024-03-11 DIAGNOSIS — E1169 Type 2 diabetes mellitus with other specified complication: Secondary | ICD-10-CM

## 2024-03-11 DIAGNOSIS — J449 Chronic obstructive pulmonary disease, unspecified: Secondary | ICD-10-CM | POA: Diagnosis not present

## 2024-03-11 DIAGNOSIS — R351 Nocturia: Secondary | ICD-10-CM

## 2024-03-11 DIAGNOSIS — Z7984 Long term (current) use of oral hypoglycemic drugs: Secondary | ICD-10-CM | POA: Diagnosis not present

## 2024-03-11 DIAGNOSIS — N401 Enlarged prostate with lower urinary tract symptoms: Secondary | ICD-10-CM

## 2024-03-11 LAB — CBC WITH DIFFERENTIAL/PLATELET
Basophils Absolute: 0.1 10*3/uL (ref 0.0–0.2)
Basos: 1 %
EOS (ABSOLUTE): 0.2 10*3/uL (ref 0.0–0.4)
Eos: 2 %
Hematocrit: 43.5 % (ref 37.5–51.0)
Hemoglobin: 14.8 g/dL (ref 13.0–17.7)
Immature Grans (Abs): 0 10*3/uL (ref 0.0–0.1)
Immature Granulocytes: 0 %
Lymphocytes Absolute: 1.9 10*3/uL (ref 0.7–3.1)
Lymphs: 24 %
MCH: 30 pg (ref 26.6–33.0)
MCHC: 34 g/dL (ref 31.5–35.7)
MCV: 88 fL (ref 79–97)
Monocytes Absolute: 0.6 10*3/uL (ref 0.1–0.9)
Monocytes: 7 %
Neutrophils Absolute: 5.1 10*3/uL (ref 1.4–7.0)
Neutrophils: 66 %
Platelets: 191 10*3/uL (ref 150–450)
RBC: 4.94 x10E6/uL (ref 4.14–5.80)
RDW: 13.1 % (ref 11.6–15.4)
WBC: 7.8 10*3/uL (ref 3.4–10.8)

## 2024-03-11 LAB — BAYER DCA HB A1C WAIVED: HB A1C (BAYER DCA - WAIVED): 6.1 % — ABNORMAL HIGH (ref 4.8–5.6)

## 2024-03-11 MED ORDER — OMEPRAZOLE 20 MG PO CPDR
20.0000 mg | DELAYED_RELEASE_CAPSULE | Freq: Every day | ORAL | 3 refills | Status: DC
Start: 2024-03-11 — End: 2024-04-28

## 2024-03-11 MED ORDER — ATORVASTATIN CALCIUM 40 MG PO TABS
40.0000 mg | ORAL_TABLET | Freq: Every day | ORAL | 3 refills | Status: DC
Start: 2024-03-11 — End: 2024-04-28

## 2024-03-11 MED ORDER — METFORMIN HCL ER 500 MG PO TB24
500.0000 mg | ORAL_TABLET | Freq: Two times a day (BID) | ORAL | 3 refills | Status: DC
Start: 2024-03-11 — End: 2024-04-28

## 2024-03-11 MED ORDER — TAMSULOSIN HCL 0.4 MG PO CAPS
ORAL_CAPSULE | ORAL | 3 refills | Status: AC
Start: 2024-03-11 — End: ?

## 2024-03-11 NOTE — Addendum Note (Signed)
 Addended by: Dorene Sorrow on: 03/11/2024 02:40 PM   Modules accepted: Orders

## 2024-03-11 NOTE — Progress Notes (Signed)
 BP (!) 174/77   Pulse 64   Ht 5\' 8"  (1.727 m)   Wt 228 lb (103.4 kg)   SpO2 95%   BMI 34.67 kg/m    Subjective:   Patient ID: Vincent Collins, male    DOB: 11/29/1941, 83 y.o.   MRN: [id number]  HPI: Vincent Collins is a 83 y.o. male presenting on 03/11/2024 for Medical Management of Chronic Issues, Diabetes, Hyperlipidemia, and Hypertension   HPI Type 2 diabetes mellitus Patient comes in today for recheck of his diabetes. Patient has been currently taking metformin and [place]. Patient is currently on an ACE inhibitor/ARB. Patient has not seen an ophthalmologist this year. Patient denies any new issues with their feet. The symptom started onset as an adult hypertension and hyperlipidemia and A-fib ARE RELATED TO DM   Hypertension and A-fib recheck Patient is currently on Eliquis and lisinopril and metoprolol and spironolactone, and their blood pressure today is 174/77 and recheck was 143/69 and he says is running little better in the 130s over 60s at home consistently.. Patient denies any lightheadedness or dizziness. Patient denies headaches, blurred vision, chest pains, shortness of breath, or weakness. Denies any side effects from medication and is content with current medication.   Hyperlipidemia Patient is coming in for recheck of his hyperlipidemia. The patient is currently taking atorvastatin. They deny any issues with myalgias or history of liver damage from it. They deny any focal numbness or weakness or chest pain.   COPD Patient is coming in for COPD recheck today.  He is currently on albuterol as needed and Flonase.  He has a mild chronic cough but denies any major coughing spells or wheezing spells.  He has 0 nighttime symptoms per week and 0 daytime symptoms per week currently.   BPH Patient is coming in for recheck on BPH Symptoms: None currently Medication: Flomax Last PSA: Over a year ago  Relevant past medical, surgical, family and social history reviewed and  updated as indicated. Interim medical history since our last visit reviewed. Allergies and medications reviewed and updated.  Review of Systems  Constitutional:  Negative for chills and fever.  Eyes:  Negative for visual disturbance.  Respiratory:  Negative for shortness of breath and wheezing.   Cardiovascular:  Negative for chest pain and leg swelling.  Musculoskeletal:  Negative for back pain and gait problem.  Skin:  Negative for rash.  All other systems reviewed and are negative.   Per HPI unless specifically indicated above   Allergies as of 03/11/2024       Reactions   Sulfa Antibiotics Other (See Comments)   Unsure of reaction        Medication List        Accurate as of March 11, 2024  2:05 PM. If you have any questions, ask your nurse or doctor.          Accu-Chek Guide test strip Generic drug: glucose blood TEST BLOOD SUGAR DAILY; DX: E11.9.   Accu-Chek Guide w/Device Kit TEST BLOOD SUGAR DAILY; DX: E11.9.   acetaminophen 325 MG tablet Commonly known as: TYLENOL Take 2 tablets (650 mg total) by mouth every 6 (six) hours as needed for mild pain (or Fever >/= 101).   albuterol (2.5 MG/3ML) 0.083% nebulizer solution Commonly known as: PROVENTIL USE 1 VIAL VIA NEBULIZER EVERY 6 HOURS AS NEEDED FOR WHEEZING OR  SHORTNESS OF BREATH   apixaban 5 MG Tabs tablet Commonly known as: Eliquis Take 1 tablet (5 mg  total) by mouth 2 (two) times daily.   atorvastatin 40 MG tablet Commonly known as: LIPITOR Take 1 tablet (40 mg total) by mouth daily.   CO-Q 10 Omega-3 Fish Oil Caps Take 1 capsule by mouth in the morning. 100mg  Capsule Qunol   dapagliflozin propanediol 10 MG Tabs tablet Commonly known as: Farxiga Take 1 tablet (10 mg total) by mouth daily before breakfast.   fluticasone 50 MCG/ACT nasal spray Commonly known as: FLONASE USE 1 SPRAY IN BOTH  NOSTRILS ONCE DAILY   guaiFENesin 600 MG 12 hr tablet Commonly known as: Mucinex Take 1 tablet (600  mg total) by mouth 2 (two) times daily.   lisinopril 40 MG tablet Commonly known as: ZESTRIL Take 1 tablet by mouth once daily   meclizine 25 MG tablet Commonly known as: ANTIVERT Take 1 tablet (25 mg total) by mouth 3 (three) times daily as needed for dizziness.   metFORMIN 500 MG 24 hr tablet Commonly known as: GLUCOPHAGE-XR Take 1 tablet (500 mg total) by mouth 2 (two) times daily.   metoprolol succinate 50 MG 24 hr tablet Commonly known as: TOPROL-XL TAKE 1 TABLET BY MOUTH DAILY  WITH OR IMMEDIATELY FOLLOWING A  MEAL   Microlet Lancets Misc USE TO TEST BLOOD SUGAR DAILY; DX: E11.9. PATIENT HAS GLUCOMETER AND LANCING DEVICE   omeprazole 20 MG capsule Commonly known as: PRILOSEC Take 1 capsule (20 mg total) by mouth daily.   spironolactone 25 MG tablet Commonly known as: ALDACTONE Take 1 tablet (25 mg total) by mouth daily.   tamsulosin 0.4 MG Caps capsule Commonly known as: FLOMAX TAKE 2 CAPSULES BY MOUTH DAILY  AFTER SUPPER   Vitamin D 1000 units capsule Take 1,000 Units by mouth in the morning.         Objective:   BP (!) 174/77   Pulse 64   Ht 5\' 8"  (1.727 m)   Wt 228 lb (103.4 kg)   SpO2 95%   BMI 34.67 kg/m   Wt Readings from Last 3 Encounters:  03/11/24 228 lb (103.4 kg)  01/07/24 228 lb (103.4 kg)  12/11/23 222 lb (100.7 kg)    Physical Exam Vitals and nursing note reviewed.  Constitutional:      General: He is not in acute distress.    Appearance: He is well-developed. He is not diaphoretic.  Eyes:     General: No scleral icterus.    Conjunctiva/sclera: Conjunctivae normal.  Neck:     Thyroid: No thyromegaly.  Cardiovascular:     Rate and Rhythm: Normal rate and regular rhythm.     Heart sounds: Normal heart sounds. No murmur heard. Pulmonary:     Effort: Pulmonary effort is normal. No respiratory distress.     Breath sounds: Normal breath sounds. No wheezing.  Musculoskeletal:        General: No swelling. Normal range of motion.      Cervical back: Neck supple.  Lymphadenopathy:     Cervical: No cervical adenopathy.  Skin:    General: Skin is warm and dry.     Findings: No rash.  Neurological:     Mental Status: He is alert and oriented to person, place, and time.     Coordination: Coordination normal.  Psychiatric:        Behavior: Behavior normal.       Assessment & Plan:   Problem List Items Addressed This Visit       Cardiovascular and Mediastinum   Essential hypertension   Relevant Medications  atorvastatin (LIPITOR) 40 MG tablet   Persistent atrial fibrillation (HCC)   Relevant Medications   atorvastatin (LIPITOR) 40 MG tablet     Respiratory   COPD (chronic obstructive pulmonary disease) (HCC)     Endocrine   Hyperlipidemia associated with type 2 diabetes mellitus (HCC)   Relevant Medications   atorvastatin (LIPITOR) 40 MG tablet   metFORMIN (GLUCOPHAGE-XR) 500 MG 24 hr tablet   Type 2 diabetes mellitus (HCC) - Primary   Relevant Medications   atorvastatin (LIPITOR) 40 MG tablet   metFORMIN (GLUCOPHAGE-XR) 500 MG 24 hr tablet   Other Relevant Orders   CBC with Differential/Platelet   Bayer DCA Hb A1c Waived     Genitourinary   BPH (benign prostatic hyperplasia)   Relevant Medications   tamsulosin (FLOMAX) 0.4 MG CAPS capsule   Other Visit Diagnoses       Chronic gastritis without bleeding, unspecified gastritis type       Relevant Medications   omeprazole (PRILOSEC) 20 MG capsule       A1c looks good at 6.1, blood pressure looks good as well.  He says it runs good at home and is only slightly elevated here on the recheck.  No changes Follow up plan: Return in about 3 months (around 06/11/2024), or if symptoms worsen or fail to improve, for Diabetes recheck.  Counseling provided for all of the vaccine components Orders Placed This Encounter  Procedures   CBC with Differential/Platelet   Bayer DCA Hb A1c Waived    Arville Care, MD [facility] 03/11/2024, 2:05 PM

## 2024-03-12 LAB — MICROALBUMIN / CREATININE URINE RATIO
Creatinine, Urine: 181.8 mg/dL
Microalb/Creat Ratio: 8 mg/g{creat} (ref 0–29)
Microalbumin, Urine: 14 ug/mL

## 2024-03-17 ENCOUNTER — Encounter: Payer: Self-pay | Admitting: Family Medicine

## 2024-03-25 ENCOUNTER — Encounter: Payer: Self-pay | Admitting: Family Medicine

## 2024-03-25 ENCOUNTER — Ambulatory Visit: Admitting: Family Medicine

## 2024-03-25 ENCOUNTER — Ambulatory Visit (INDEPENDENT_AMBULATORY_CARE_PROVIDER_SITE_OTHER)

## 2024-03-25 ENCOUNTER — Other Ambulatory Visit: Payer: Self-pay | Admitting: Family Medicine

## 2024-03-25 VITALS — BP 155/74 | HR 98 | Temp 98.8°F | Ht 68.0 in | Wt 225.0 lb

## 2024-03-25 DIAGNOSIS — J069 Acute upper respiratory infection, unspecified: Secondary | ICD-10-CM

## 2024-03-25 DIAGNOSIS — R0989 Other specified symptoms and signs involving the circulatory and respiratory systems: Secondary | ICD-10-CM

## 2024-03-25 DIAGNOSIS — R9389 Abnormal findings on diagnostic imaging of other specified body structures: Secondary | ICD-10-CM | POA: Diagnosis not present

## 2024-03-25 DIAGNOSIS — R059 Cough, unspecified: Secondary | ICD-10-CM | POA: Diagnosis not present

## 2024-03-25 LAB — VERITOR FLU A/B WAIVED
Influenza A: NEGATIVE
Influenza B: NEGATIVE

## 2024-03-25 MED ORDER — BENZONATATE 100 MG PO CAPS
100.0000 mg | ORAL_CAPSULE | Freq: Three times a day (TID) | ORAL | 0 refills | Status: DC | PRN
Start: 2024-03-25 — End: 2024-09-21

## 2024-03-25 MED ORDER — AZELASTINE HCL 0.1 % NA SOLN
1.0000 | Freq: Two times a day (BID) | NASAL | 12 refills | Status: AC
Start: 2024-03-25 — End: ?

## 2024-03-25 MED ORDER — DESLORATADINE 5 MG PO TABS
5.0000 mg | ORAL_TABLET | Freq: Every day | ORAL | 3 refills | Status: DC
Start: 2024-03-25 — End: 2024-09-21

## 2024-03-25 NOTE — Progress Notes (Signed)
 Subjective: CC: URI PCP: Dettinger, Elige Radon, MD BMW:UXLKG D Crist is a 83 y.o. male presenting to clinic today for:  1.  URI Patient reports that he has had about a 1 week cough that is somewhat productive.  He reports rhinorrhea, sinus congestion.  He had pneumonia in November so of course he is concerned.  He has been utilizing over-the-counter Coricidin and his home Flonase.  No known COPD.  Very little tobacco use and he quit in his 71s.  Denies any wheezing, dyspnea on exertion or chest pain   ROS: Per HPI  Allergies  Allergen Reactions   Sulfa Antibiotics Other (See Comments)    Unsure of reaction   Past Medical History:  Diagnosis Date   Arrhythmia    Broken leg and ribs, right, closed, initial encounter    COPD (chronic obstructive pulmonary disease) (HCC)    Diabetes mellitus without complication (HCC)    Diverticulitis    GERD (gastroesophageal reflux disease)    Hydronephrosis of left kidney    Hyperlipidemia    Hypertension    Left ureteral stone    obstructing   Renal calculus or stone 2015    Current Outpatient Medications:    acetaminophen (TYLENOL) 325 MG tablet, Take 2 tablets (650 mg total) by mouth every 6 (six) hours as needed for mild pain (or Fever >/= 101)., Disp: , Rfl:    albuterol (PROVENTIL) (2.5 MG/3ML) 0.083% nebulizer solution, USE 1 VIAL VIA NEBULIZER EVERY 6 HOURS AS NEEDED FOR WHEEZING OR  SHORTNESS OF BREATH, Disp: 1080 mL, Rfl: 1   apixaban (ELIQUIS) 5 MG TABS tablet, Take 1 tablet (5 mg total) by mouth 2 (two) times daily., Disp: 180 tablet, Rfl: 3   atorvastatin (LIPITOR) 40 MG tablet, Take 1 tablet (40 mg total) by mouth daily., Disp: 100 tablet, Rfl: 3   Blood Glucose Monitoring Suppl (ACCU-CHEK GUIDE) w/Device KIT, TEST BLOOD SUGAR DAILY; DX: E11.9., Disp: 1 kit, Rfl: 0   Cholecalciferol (VITAMIN D) 1000 UNITS capsule, Take 1,000 Units by mouth in the morning., Disp: , Rfl:    Coenzyme Q10-Fish Oil-Vit E (CO-Q 10 OMEGA-3 FISH OIL)  CAPS, Take 1 capsule by mouth in the morning. 100mg  Capsule Qunol, Disp: , Rfl:    dapagliflozin propanediol (FARXIGA) 10 MG TABS tablet, Take 1 tablet (10 mg total) by mouth daily before breakfast., Disp: 30 tablet, Rfl: 5   fluticasone (FLONASE) 50 MCG/ACT nasal spray, USE 1 SPRAY IN BOTH  NOSTRILS ONCE DAILY, Disp: 48 g, Rfl: 1   glucose blood (ACCU-CHEK GUIDE) test strip, TEST BLOOD SUGAR DAILY; DX: E11.9., Disp: 100 each, Rfl: 3   guaiFENesin (MUCINEX) 600 MG 12 hr tablet, Take 1 tablet (600 mg total) by mouth 2 (two) times daily., Disp: 30 tablet, Rfl: 0   lisinopril (ZESTRIL) 40 MG tablet, Take 1 tablet by mouth once daily, Disp: 90 tablet, Rfl: 3   meclizine (ANTIVERT) 25 MG tablet, Take 1 tablet (25 mg total) by mouth 3 (three) times daily as needed for dizziness., Disp: 30 tablet, Rfl: 0   metFORMIN (GLUCOPHAGE-XR) 500 MG 24 hr tablet, Take 1 tablet (500 mg total) by mouth 2 (two) times daily., Disp: 200 tablet, Rfl: 3   metoprolol succinate (TOPROL-XL) 50 MG 24 hr tablet, TAKE 1 TABLET BY MOUTH DAILY  WITH OR IMMEDIATELY FOLLOWING A  MEAL, Disp: 90 tablet, Rfl: 3   Microlet Lancets MISC, USE TO TEST BLOOD SUGAR DAILY; DX: E11.9. PATIENT HAS GLUCOMETER AND LANCING DEVICE, Disp: 100  each, Rfl: 11   omeprazole (PRILOSEC) 20 MG capsule, Take 1 capsule (20 mg total) by mouth daily., Disp: 100 capsule, Rfl: 3   tamsulosin (FLOMAX) 0.4 MG CAPS capsule, TAKE 2 CAPSULES BY MOUTH DAILY  AFTER SUPPER, Disp: 200 capsule, Rfl: 3   spironolactone (ALDACTONE) 25 MG tablet, Take 1 tablet (25 mg total) by mouth daily., Disp: 90 tablet, Rfl: 3 Social History   Socioeconomic History   Marital status: Married    Spouse name: Harriett Sine   Number of children: 2   Years of education: 8th   Highest education level: 8th grade  Occupational History   Occupation: [employer]    Comment: part time 6 hour shifts  Tobacco Use   Smoking status: Former    Current packs/day: 0.00    Types: Cigarettes     Quit date: 06/18/1967    Years since quitting: 56.8   Smokeless tobacco: Former    Types: Engineer, drilling   Vaping status: Never Used  Substance and Sexual Activity   Alcohol use: No   Drug use: No   Sexual activity: Not Currently  Other Topics Concern   Not on file  Social History Narrative   Lives home with wife - still working part time   Social Drivers of Corporate investment banker Strain: Low Risk  (06/10/2023)   Overall Financial Resource Strain (CARDIA)    Difficulty of Paying Living Expenses: Not hard at all  Food Insecurity: No Food Insecurity (06/10/2023)   Hunger Vital Sign    Worried About Running Out of Food in the Last Year: Never true    Ran Out of Food in the Last Year: Never true  Transportation Needs: No Transportation Needs (06/10/2023)   PRAPARE - Administrator, Civil Service (Medical): No    Lack of Transportation (Non-Medical): No  Physical Activity: Insufficiently Active (06/10/2023)   Exercise Vital Sign    Days of Exercise per Week: 3 days    Minutes of Exercise per Session: 30 min  Stress: No Stress Concern Present (06/10/2023)   [employer] of Occupational Health - Occupational Stress Questionnaire    Feeling of Stress : Not at all  Social Connections: Moderately Integrated (06/10/2023)   Social Connection and Isolation Panel [NHANES]    Frequency of Communication with Friends and Family: More than three times a week    Frequency of Social Gatherings with Friends and Family: More than three times a week    Attends Religious Services: More than 4 times per year    Active Member of [employer] or Organizations: No    Attends Banker Meetings: Never    Marital Status: Married  Catering manager Violence: Not At Risk (06/10/2023)   Humiliation, Afraid, Rape, and Kick questionnaire    Fear of Current or Ex-Partner: No    Emotionally Abused: No    Physically Abused: No    Sexually Abused: No   Family History  Problem Relation  Age of Onset   Early death Father        MI age 87   Heart attack Father    COPD Brother    Cancer Sister     Objective: Office vital signs reviewed. BP (!) 155/74   Pulse 98   Temp 98.8 F (37.1 C)   Ht 5\' 8"  (1.727 m)   Wt 225 lb (102.1 kg)   SpO2 98%   BMI 34.21 kg/m   Physical Examination:  General:  Awake, alert, well nourished, No acute distress HEENT: Normal    Neck: No masses palpated. No lymphadenopathy    Ears: Tympanic membranes intact, normal light reflex, no erythema, no bulging    Eyes: PERRLA, extraocular membranes intact, sclera white    Nose: nasal turbinates moist, clear nasal discharge    Throat: moist mucus membranes, no erythema, no tonsillar exudate.  Airway is patent Cardio: regular rate and rhythm, S1S2 heard, no murmurs appreciated Pulm:  no wheezes, rales; normal work of breathing on room air ?rhonchi in right lower lung. Otherwise, clear to auscultation bilaterally  DG Chest 2 View Result Date: 03/25/2024 CLINICAL DATA:  Cough.  Right-sided rhonchi. EXAM: CHEST - 2 VIEW COMPARISON:  12/04/2023. FINDINGS: Bilateral lung fields are clear. Bilateral costophrenic angles are clear. Note is made of elevated right hemidiaphragm. Normal cardio-mediastinal silhouette. No acute osseous abnormalities. The soft tissues are within normal limits. IMPRESSION: No active cardiopulmonary disease. Electronically Signed   By: Jules Schick M.D.   On: 03/25/2024 16:12   Assessment/ Plan: 83 y.o. male   Upper respiratory tract infection, unspecified type - Plan: Novel Coronavirus, NAA ([facility]), Veritor Flu A/B Waived, DG Chest 2 View, azelastine (ASTELIN) 0.1 % nasal spray, desloratadine (CLARINEX) 5 MG tablet, benzonatate (TESSALON PERLES) 100 MG capsule  Rhonchi at right lung base - Plan: DG Chest 2 View  Rapid flu negative.  COVID sent.  Suspect this is viral versus allergic in nature.  I have sent over Astelin for drainage, Tessalon Perles as needed cough and  Clarinex as well.  No evidence of secondary bacterial infection and chest x-ray was negative for pulmonary infiltrates to suggest pneumonia.  We discussed red flag signs and symptoms warranting further evaluation reevaluation.  He voiced good understanding   Oswin Griffith Hulen Skains, DO [facility] [phone]

## 2024-03-26 LAB — NOVEL CORONAVIRUS, NAA: SARS-CoV-2, NAA: NOT DETECTED

## 2024-03-28 ENCOUNTER — Encounter: Payer: Self-pay | Admitting: Family Medicine

## 2024-04-27 ENCOUNTER — Other Ambulatory Visit: Payer: Self-pay | Admitting: Family Medicine

## 2024-04-27 DIAGNOSIS — E1169 Type 2 diabetes mellitus with other specified complication: Secondary | ICD-10-CM

## 2024-04-27 DIAGNOSIS — K2950 Unspecified chronic gastritis without bleeding: Secondary | ICD-10-CM

## 2024-05-12 ENCOUNTER — Other Ambulatory Visit: Payer: Self-pay | Admitting: Cardiovascular Disease

## 2024-06-10 ENCOUNTER — Ambulatory Visit (INDEPENDENT_AMBULATORY_CARE_PROVIDER_SITE_OTHER): Payer: Medicare Other

## 2024-06-10 VITALS — BP 155/74 | HR 98 | Ht 68.0 in | Wt 225.0 lb

## 2024-06-10 DIAGNOSIS — Z0000 Encounter for general adult medical examination without abnormal findings: Secondary | ICD-10-CM | POA: Diagnosis not present

## 2024-06-10 NOTE — Patient Instructions (Signed)
 Vincent Collins , Thank you for taking time out of your busy schedule to complete your Annual Wellness Visit with me. I enjoyed our conversation and look forward to speaking with you again next year. I, as well as your care team,  appreciate your ongoing commitment to your health goals. Please review the following plan we discussed and let me know if I can assist you in the future. Your Game plan/ To Do List    Follow up Visits: Next Medicare AWV with our clinical staff: 06/13/25 at 8:00a.m.   Next Office Visit with your provider: 06/13/24 at 1:40p.m.  Clinician Recommendations:  Aim for 30 minutes of exercise or brisk walking, 6-8 glasses of water , and 5 servings of fruits and vegetables each day. N/a      This is a list of the screening recommended for you and due dates:  Health Maintenance  Topic Date Due   Eye exam for diabetics  02/25/2023   COVID-19 Vaccine (3 - 2024-25 season) 06/26/2025*   Flu Shot  07/29/2024   Hemoglobin A1C  09/11/2024   Complete foot exam   09/22/2024   Yearly kidney function blood test for diabetes  12/10/2024   Yearly kidney health urinalysis for diabetes  03/11/2025   Medicare Annual Wellness Visit  06/10/2025   DTaP/Tdap/Td vaccine (4 - Td or Tdap) 12/06/2030   Pneumococcal Vaccine for age over 73  Completed   Zoster (Shingles) Vaccine  Completed   HPV Vaccine  Aged Out   Meningitis B Vaccine  Aged Out   Hepatitis C Screening  Discontinued  *Topic was postponed. The date shown is not the original due date.    Advanced directives: (Declined) Advance directive discussed with you today. Even though you declined this today, please call our office should you change your mind, and we can give you the proper paperwork for you to fill out. Advance Care Planning is important because it:  [x]  Makes sure you receive the medical care that is consistent with your values, goals, and preferences  [x]  It provides guidance to your family and loved ones and reduces their  decisional burden about whether or not they are making the right decisions based on your wishes.  Follow the link provided in your after visit summary or read over the paperwork we have mailed to you to help you started getting your Advance Directives in place. If you need assistance in completing these, please reach out to us  so that we can help you!  See attachments for Preventive Care and Fall Prevention Tips.

## 2024-06-10 NOTE — Progress Notes (Signed)
 Subjective:   Vincent Collins is a 83 y.o. who presents for a Medicare Wellness preventive visit.  As a reminder, Annual Wellness Visits don't include a physical exam, and some assessments may be limited, especially if this visit is performed virtually. We may recommend an in-person follow-up visit with your provider if needed.  Visit Complete: Virtual I connected with  Vincent Collins on 06/10/24 by a audio enabled telemedicine application and verified that I am speaking with the correct person using two identifiers.  Patient Location: Other:  at work  Provider Location: Home Office  I discussed the limitations of evaluation and management by telemedicine. The patient expressed understanding and agreed to proceed.  Vital Signs: Because this visit was a virtual/telehealth visit, some criteria may be missing or patient reported. Any vitals not documented were not able to be obtained and vitals that have been documented are patient reported.  VideoDeclined- This patient declined Librarian, academic. Therefore the visit was completed with audio only.  Persons Participating in Visit: Patient.  AWV Questionnaire: No: Patient Medicare AWV questionnaire was not completed prior to this visit.  Cardiac Risk Factors include: advanced age (>2men, >67 women);diabetes mellitus;dyslipidemia;hypertension;male gender;obesity (BMI >30kg/m2)     Objective:    Today's Vitals   06/10/24 0802  BP: (!) 155/74  Pulse: 98  Weight: 225 lb (102.1 kg)  Height: 5' 8 (1.727 m)   Body mass index is 34.21 kg/m.     06/10/2024    8:24 AM 12/04/2023    1:59 PM 06/10/2023    2:16 PM 05/22/2022    9:55 AM 05/21/2021    9:00 AM 08/03/2020    8:13 AM 07/31/2020    3:21 AM  Advanced Directives  Does Patient Have a Medical Advance Directive? No No No No No No No  Would patient like information on creating a medical advance directive?   No - Patient declined No - Patient declined Yes  (MAU/Ambulatory/Procedural Areas - Information given) No - Patient declined No - Patient declined    Current Medications (verified) Outpatient Encounter Medications as of 06/10/2024  Medication Sig   acetaminophen  (TYLENOL ) 325 MG tablet Take 2 tablets (650 mg total) by mouth every 6 (six) hours as needed for mild pain (or Fever >/= 101).   albuterol  (PROVENTIL ) (2.5 MG/3ML) 0.083% nebulizer solution USE 1 VIAL VIA NEBULIZER EVERY 6 HOURS AS NEEDED FOR WHEEZING OR  SHORTNESS OF BREATH   apixaban  (ELIQUIS ) 5 MG TABS tablet Take 1 tablet (5 mg total) by mouth 2 (two) times daily.   atorvastatin  (LIPITOR) 40 MG tablet TAKE 1 TABLET BY MOUTH DAILY   azelastine  (ASTELIN ) 0.1 % nasal spray Place 1 spray into both nostrils 2 (two) times daily. For runny nose   benzonatate  (TESSALON  PERLES) 100 MG capsule Take 1 capsule (100 mg total) by mouth 3 (three) times daily as needed for cough.   Blood Glucose Monitoring Suppl (ACCU-CHEK GUIDE) w/Device KIT TEST BLOOD SUGAR DAILY; DX: E11.9.   Cholecalciferol  (VITAMIN D ) 1000 UNITS capsule Take 1,000 Units by mouth in the morning.   Coenzyme Q10-Fish Oil -Vit E (CO-Q 10 OMEGA-3 FISH OIL ) CAPS Take 1 capsule by mouth in the morning. 100mg  Capsule Qunol   dapagliflozin  propanediol (FARXIGA ) 10 MG TABS tablet Take 1 tablet (10 mg total) by mouth daily before breakfast.   desloratadine  (CLARINEX ) 5 MG tablet Take 1 tablet (5 mg total) by mouth daily. For allergies/ runny nose   fluticasone  (FLONASE ) 50 MCG/ACT nasal spray  USE 1 SPRAY IN BOTH  NOSTRILS ONCE DAILY   glucose blood (ACCU-CHEK GUIDE) test strip TEST BLOOD SUGAR DAILY; DX: E11.9.   lisinopril  (ZESTRIL ) 40 MG tablet Take 1 tablet by mouth once daily   meclizine  (ANTIVERT ) 25 MG tablet Take 1 tablet (25 mg total) by mouth 3 (three) times daily as needed for dizziness.   metFORMIN  (GLUCOPHAGE -XR) 500 MG 24 hr tablet TAKE 1 TABLET BY MOUTH TWICE  DAILY   metoprolol  succinate (TOPROL -XL) 50 MG 24 hr tablet TAKE  1 TABLET BY MOUTH DAILY  WITH OR IMMEDIATELY FOLLOWING A  MEAL   Microlet Lancets MISC USE TO TEST BLOOD SUGAR DAILY; DX: E11.9. PATIENT HAS GLUCOMETER AND LANCING DEVICE   omeprazole  (PRILOSEC) 20 MG capsule TAKE 1 CAPSULE BY MOUTH DAILY   spironolactone  (ALDACTONE ) 25 MG tablet Take 1 tablet by mouth once daily   tamsulosin  (FLOMAX ) 0.4 MG CAPS capsule TAKE 2 CAPSULES BY MOUTH DAILY  AFTER SUPPER   No facility-administered encounter medications on file as of 06/10/2024.    Allergies (verified) Sulfa antibiotics   History: Past Medical History:  Diagnosis Date   Arrhythmia    Broken leg and ribs, right, closed, initial encounter    COPD (chronic obstructive pulmonary disease) (HCC)    Diabetes mellitus without complication (HCC)    Diverticulitis    GERD (gastroesophageal reflux disease)    Hydronephrosis of left kidney    Hyperlipidemia    Hypertension    Left ureteral stone    obstructing   Renal calculus or stone 2015   Past Surgical History:  Procedure Laterality Date   2d echo  04/04/09   CHOLECYSTECTOMY N/A 08/03/2020   Procedure: LAPAROSCOPIC CHOLECYSTECTOMY WITH INTRAOPERATIVE CHOLANGIOGRAM;  Surgeon: Aldean Hummingbird, MD;  Location: [facility] OR;  Service: General;  Laterality: N/A;   CYSTOSCOPY WITH RETROGRADE PYELOGRAM, URETEROSCOPY AND STENT PLACEMENT Left 10/11/2014   Procedure: CYSTOSCOPY WITH RETROGRADE PYELOGRAM, DIAGNOSTIC URETEROSCOPY AND STENT PLACEMENT;  Surgeon: Osborn Blaze, MD;  Location: [facility];  Service: Urology;  Laterality: Left;   LUMBAR DISC SURGERY     SPINE SURGERY     Family History  Problem Relation Age of Onset   Early death Father        MI age 54   Heart attack Father    COPD Brother    Cancer Sister    Social History   Socioeconomic History   Marital status: Married    Spouse name: Vincent Collins   Number of children: 2   Years of education: 8th   Highest education level: 8th grade  Occupational History   Occupation: [employer]    Comment: part time 6 hour shifts  Tobacco Use   Smoking status: Former    Current packs/day: 0.00    Types: Cigarettes    Quit date: 06/18/1967    Years since quitting: 57.0   Smokeless tobacco: Former    Types: Engineer, drilling   Vaping status: Never Used  Substance and Sexual Activity   Alcohol use: No   Drug use: No   Sexual activity: Not Currently  Other Topics Concern   Not on file  Social History Narrative   Lives home with wife - still working part time   Social Drivers of Corporate investment banker Strain: Low Risk  (06/10/2024)   Overall Financial Resource Strain (CARDIA)    Difficulty of Paying Living Expenses: Not hard at all  Food Insecurity: No Food Insecurity (06/10/2024)  Hunger Vital Sign    Worried About Running Out of Food in the Last Year: Never true    Ran Out of Food in the Last Year: Never true  Transportation Needs: No Transportation Needs (06/10/2024)   PRAPARE - Administrator, Civil Service (Medical): No    Lack of Transportation (Non-Medical): No  Physical Activity: Inactive (06/10/2024)   Exercise Vital Sign    Days of Exercise per Week: 0 days    Minutes of Exercise per Session: 0 min  Stress: No Stress Concern Present (06/10/2024)   [employer] of Occupational Health - Occupational Stress Questionnaire    Feeling of Stress: Not at all  Social Connections: Socially Integrated (06/10/2024)   Social Connection and Isolation Panel    Frequency of Communication with Friends and Family: More than three times a week    Frequency of Social Gatherings with Friends and Family: More than three times a week    Attends Religious Services: More than 4 times per year    Active Member of [employer] or Organizations: No    Attends Engineer, structural: More than 4 times per year    Marital Status: Married    Tobacco Counseling Counseling given: Yes    Clinical Intake:  Pre-visit preparation completed:  Yes  Pain : No/denies pain     BMI - recorded: 34.21 Nutritional Status: BMI > 30  Obese Nutritional Risks: None Diabetes: Yes CBG done?: No  Lab Results  Component Value Date   HGBA1C 6.1 (H) 03/11/2024   HGBA1C 7.3 (H) 12/11/2023   HGBA1C 6.1 (H) 09/23/2023     How often do you need to have someone help you when you read instructions, pamphlets, or other written materials from your doctor or pharmacy?: 1 - Never  Interpreter Needed?: No  Information entered by :: Alia T/cma   Activities of Daily Living     06/10/2024    8:08 AM  In your present state of health, do you have any difficulty performing the following activities:  Hearing? 1  Comment Some  Vision? 0  Comment pt goes [facility] in [place]/last a yr ago  Difficulty concentrating or making decisions? 0  Walking or climbing stairs? 0  Dressing or bathing? 0  Doing errands, shopping? 0  Preparing Food and eating ? N  Using the Toilet? N  In the past six months, have you accidently leaked urine? N  Do you have problems with loss of bowel control? N  Managing your Medications? N  Managing your Finances? N  Housekeeping or managing your Housekeeping? N    Patient Care Team: Dettinger, Lucio Sabin, MD as PCP - General (Family Medicine) O'Neal, Cathay Clonts, MD as PCP - Cardiology (Cardiology) O'Neal, Cathay Clonts, MD as Consulting Physician (Cardiology) Hyland Mailman, MD as Referring Physician (Optometry)  I have updated your Care Teams any recent Medical Services you may have received from other providers in the past year.     Assessment:   This is a routine wellness examination for Haze.  Hearing/Vision screen Hearing Screening - Comments:: Pt denies hearing dif  Vision Screening - Comments:: pt goes [facility] in [place]/last a yr ago   Goals Addressed             This Visit's Progress    Patient Stated       Would like to get pt's wife to get some new dentures       Depression  Screen  06/10/2024    8:28 AM 03/25/2024    3:10 PM 03/11/2024    1:58 PM 12/11/2023    9:03 AM 11/20/2023    3:30 PM 09/23/2023    8:13 AM 06/10/2023    2:15 PM  PHQ 2/9 Scores  PHQ - 2 Score 0 0 0 0 0 0 0  PHQ- 9 Score 0 0  0 0      Fall Risk     06/10/2024    8:23 AM 03/25/2024    3:10 PM 03/11/2024    1:58 PM 12/11/2023    9:03 AM 11/20/2023    3:30 PM  Fall Risk   Falls in the past year? 0 0 0 0 0  Number falls in past yr: 0 0     Injury with Fall? 0 0     Risk for fall due to : No Fall Risks No Fall Risks     Follow up Falls evaluation completed Falls evaluation completed       MEDICARE RISK AT HOME:  Medicare Risk at Home Any stairs in or around the home?: Yes If so, are there any without handrails?: Yes Home free of loose throw rugs in walkways, pet beds, electrical cords, etc?: Yes Adequate lighting in your home to reduce risk of falls?: Yes Life alert?: No Use of a cane, walker or w/c?: No Grab bars in the bathroom?: No Shower chair or bench in shower?: No Elevated toilet seat or a handicapped toilet?: No  TIMED UP AND GO:  Was the test performed?  no  Cognitive Function: 6CIT completed    06/17/2017   10:21 AM  MMSE - Mini Mental State Exam  Not completed: Unable to complete  Orientation to time 5   Orientation to Place 4   Registration 3   Attention/ Calculation 0   Attention/Calculation-comments unable to spell   Recall 3   Language- name 2 objects 2   Language- repeat 1  Language- follow 3 step command 3   Language- read & follow direction 0   Language-read & follow direction-comments difficulty with reading and spelling   Write a sentence 0   Write a sentence-comments difficulty with reading and spelling   Copy design 1   Total score 22      Data saved with a previous flowsheet row definition        06/10/2024    8:31 AM 06/10/2024    8:28 AM 06/10/2023    2:17 PM 05/22/2022   10:04 AM 04/16/2020    8:44 AM  6CIT Screen  What Year? 0  points 0 points 0 points 0 points 0 points  What month? 0 points 0 points 0 points 0 points 0 points  What time? 0 points 0 points 0 points 0 points 0 points  Count back from 20 0 points 0 points 0 points 0 points 0 points  Months in reverse 4 points 4 points 0 points 4 points 0 points  Repeat phrase 10 points 10 points 0 points 6 points 4 points  Total Score 14 points 14 points 0 points 10 points 4 points    Immunizations Immunization History  Administered Date(s) Administered   DTaP 11/23/2007   Fluad Quad(high Dose 65+) 01/04/2020, 12/06/2020, 12/06/2021   Fluad Trivalent(High Dose 65+) 09/23/2023   Influenza Whole 09/26/2010   Influenza, High Dose Seasonal PF 10/07/2017, 01/04/2019   Influenza,inj,Quad PF,6+ Mos 10/06/2013, 10/02/2014, 10/19/2015, 11/26/2016   Moderna Sars-Covid-2 Vaccination 05/21/2020, 06/18/2020   Pneumococcal Conjugate-13 05/09/2015  Pneumococcal Polysaccharide-23 04/05/2012, 04/20/2012   Tdap 05/17/2009, 12/06/2020   Zoster Recombinant(Shingrix ) 03/10/2022, 06/05/2022   Zoster, Live 05/17/2014    Screening Tests Health Maintenance  Topic Date Due   OPHTHALMOLOGY EXAM  02/25/2023   COVID-19 Vaccine (3 - 2024-25 season) 06/26/2025 (Originally 08/30/2023)   INFLUENZA VACCINE  07/29/2024   HEMOGLOBIN A1C  09/11/2024   FOOT EXAM  09/22/2024   Diabetic kidney evaluation - eGFR measurement  12/10/2024   Diabetic kidney evaluation - Urine ACR  03/11/2025   Medicare Annual Wellness (AWV)  06/10/2025   DTaP/Tdap/Td (4 - Td or Tdap) 12/06/2030   Pneumococcal Vaccine: 50+ Years  Completed   Zoster Vaccines- Shingrix   Completed   HPV VACCINES  Aged Out   Meningococcal B Vaccine  Aged Out   Hepatitis C Screening  Discontinued    Health Maintenance  Health Maintenance Due  Topic Date Due   OPHTHALMOLOGY EXAM  02/25/2023   Health Maintenance Items Addressed: See Nurse Notes at the end of this note  Additional Screening:  Vision Screening: Recommended  annual ophthalmology exams for early detection of glaucoma and other disorders of the eye. Would you like a referral to an eye doctor? No    Dental Screening: Recommended annual dental exams for proper oral hygiene  Community Resource Referral / Chronic Care Management: CRR required this visit?  No   CCM required this visit?  No   Plan:    I have personally reviewed and noted the following in the patient's chart:   Medical and social history Use of alcohol, tobacco or illicit drugs  Current medications and supplements including opioid prescriptions. Patient is not currently taking opioid prescriptions. Functional ability and status Nutritional status Physical activity Advanced directives List of other physicians Hospitalizations, surgeries, and ER visits in previous 12 months Vitals Screenings to include cognitive, depression, and falls Referrals and appointments  In addition, I have reviewed and discussed with patient certain preventive protocols, quality metrics, and best practice recommendations. A written personalized care plan for preventive services as well as general preventive health recommendations were provided to patient.   Michaelle Adolphus, CMA   06/10/2024   After Visit Summary: (MyChart) Due to this being a telephonic visit, the after visit summary with patients personalized plan was offered to patient via MyChart   Notes: pt is aware that due for another eye exam.

## 2024-06-13 ENCOUNTER — Encounter: Payer: Self-pay | Admitting: Family Medicine

## 2024-06-13 ENCOUNTER — Ambulatory Visit: Admitting: Family Medicine

## 2024-06-13 VITALS — BP 174/84 | HR 55 | Temp 98.4°F | Ht 68.0 in | Wt 228.1 lb

## 2024-06-13 DIAGNOSIS — R42 Dizziness and giddiness: Secondary | ICD-10-CM | POA: Diagnosis not present

## 2024-06-13 DIAGNOSIS — J449 Chronic obstructive pulmonary disease, unspecified: Secondary | ICD-10-CM | POA: Diagnosis not present

## 2024-06-13 DIAGNOSIS — I10 Essential (primary) hypertension: Secondary | ICD-10-CM | POA: Diagnosis not present

## 2024-06-13 DIAGNOSIS — E1169 Type 2 diabetes mellitus with other specified complication: Secondary | ICD-10-CM

## 2024-06-13 DIAGNOSIS — Z7984 Long term (current) use of oral hypoglycemic drugs: Secondary | ICD-10-CM

## 2024-06-13 DIAGNOSIS — E785 Hyperlipidemia, unspecified: Secondary | ICD-10-CM

## 2024-06-13 LAB — BAYER DCA HB A1C WAIVED: HB A1C (BAYER DCA - WAIVED): 6.4 % — ABNORMAL HIGH (ref 4.8–5.6)

## 2024-06-13 NOTE — Progress Notes (Unsigned)
 Established Patient Office Visit  Subjective   Patient ID: Vincent Collins, male    DOB: [birth year]  Age: 83 y.o. MRN: [id number]  Chief Complaint  Patient presents with   Medical Management of Chronic Issues    HPI  (1) T2DM Patient is present to clinic today for F/U on diabetes. He is taking metformin  and farxiga , and mentions that their blood glucose levels have been normal at home. Patient denies any new tingling or numbness in the extremities. No other concerns or questions reported.   (2) Hyperlipidemia  Patient is present to clinic today for F/U on hyperlipidemia. He is currently taking atorvastatin . They deny any tachycardia, chest pain, weakness or joint pain. Recent lipid profile unremarkable.   (3) Hypertension  Patient is present to clinic today for F/U on hypertension. He is currently taking lisinopril  and metoprolol . His blood pressure was elevated today at 163/75 and recheck 174/84 in office, however he mentions that his numbers have been better at home. Patient does report feeling a little lightheaded and dizzy today and mentions that he started feeling it after eating a sweet-potato pie this morning. Patient denies having tachycardia, chest pain, palpitations, syncope, shortness of breath, cough, wheezing, headaches, blurred vision, numbness, tingling, confusion, weakness, or focal deficits. Patient also mentions that he takes his medications regularly and he remembers taking his medications this morning too. He associates the dizziness and lightheadedness to the food he consumed in the morning, and reports that his symptoms are improving and does not express any concern.   (4) COPD Patient is present to clinic today for F/U on COPD. He is currently taking albuterol  and flonase  as needed. He denies any SOB, cough, or wheezing spells. He has not had any major symptoms or flare-up recently.    Review of Systems  Constitutional:  Negative for chills, fever, malaise/fatigue  and weight loss.  HENT:  Negative for congestion, ear pain, hearing loss, sinus pain and sore throat.   Eyes:  Negative for blurred vision, double vision and pain.  Respiratory:  Negative for cough, sputum production and wheezing.   Cardiovascular:  Negative for chest pain, palpitations and leg swelling.  Gastrointestinal:  Negative for abdominal pain, constipation, diarrhea, heartburn, nausea and vomiting.  Genitourinary:  Negative for dysuria, frequency, hematuria and urgency.  Musculoskeletal:  Negative for back pain, joint pain and myalgias.  Skin:  Negative for itching and rash.  Neurological:  Positive for dizziness. Negative for tingling, weakness and headaches.  Psychiatric/Behavioral:  Negative for depression and suicidal ideas. The patient is not nervous/anxious.       Objective:     BP (!) 174/84   Pulse (!) 55   Temp 98.4 F (36.9 C)   Ht 5' 8 (1.727 m)   Wt 228 lb 2 oz (103.5 kg)   SpO2 94%   BMI 34.69 kg/m  {Vitals History (Optional):23777}  Physical Exam Constitutional:      General: He is not in acute distress.    Appearance: Normal appearance. He is obese.  HENT:     Head: Normocephalic and atraumatic.     Right Ear: Tympanic membrane and external ear normal.     Left Ear: Tympanic membrane and external ear normal.     Mouth/Throat:     Mouth: Mucous membranes are moist.     Pharynx: Oropharynx is clear.   Eyes:     Conjunctiva/sclera: Conjunctivae normal.     Pupils: Pupils are equal, round, and reactive to light.  Cardiovascular:     Rate and Rhythm: Normal rate and regular rhythm.     Pulses: Normal pulses.     Heart sounds: Normal heart sounds. No murmur heard.    No friction rub. No gallop.  Pulmonary:     Effort: Pulmonary effort is normal. No respiratory distress.     Breath sounds: Normal breath sounds. No wheezing or rales.   Musculoskeletal:        General: No swelling or deformity. Normal range of motion.     Cervical back: Normal  range of motion and neck supple.   Skin:    General: Skin is warm and dry.   Neurological:     Mental Status: He is alert.      No results found for any visits on 06/13/24.  {Labs (Optional):23779}  The ASCVD Risk score (Arnett DK, et al., 2019) failed to calculate for the following reasons:   The 2019 ASCVD risk score is only valid for ages 50 to 27    Assessment & Plan:   (1) T2DM - Assessment: Well controlled with medications. A1C 6.4 today in office.  - Plan: Advised to regularly monitor blood glucose levels at home. Continue taking Metformin  and Farxiga  as prescribed.   (2) Hyperlipidemia  - Assessment: Well controlled with dietary modifications and medications. Recent lipid panel unremarkable.  - Plan: Continue Atorvastatin  as prescribed alongside fish-oil. Recheck lipid profile next visit.   (3) HTN and Lightheadedness - Assessment: Blood-pressure well controlled as per patient based on the measurements at home. Lightheadedness improving since the morning, at the moment there are no associated concerning symptoms such as chest pain, palpitations, syncope, SOB, or neurological deficits. Possible contributing factor can include dehydration or upset stomach. Cardiac or neurological causes are less likely due to lack of red flags. - Plan:  Continue Lisinopril  and Metoprolol  as prescribed. Advised to regularly check blood pressure at home and bring back the log for next visit. Advised to keep a close eye on his symptoms and RTC or seek emergency care if they do not improve or worsen.   (4) COPD - Assessment: Well controlled on medications.  - Plan: Continue using flonase  and albuterol  as needed for symptomatic relief during flare-ups. RTC if develop new cough or wheezing.   RTC in 6 months or if symptoms worsen or fail to improve.    No follow-ups on file.    Levert Ready, Medical Student

## 2024-06-21 ENCOUNTER — Other Ambulatory Visit: Payer: Self-pay | Admitting: Cardiovascular Disease

## 2024-06-21 DIAGNOSIS — I10 Essential (primary) hypertension: Secondary | ICD-10-CM

## 2024-06-21 DIAGNOSIS — I4819 Other persistent atrial fibrillation: Secondary | ICD-10-CM

## 2024-06-23 ENCOUNTER — Telehealth: Payer: Self-pay

## 2024-06-23 NOTE — Telephone Encounter (Signed)
 Copied from CRM [id number]. Topic: Clinical - Prescription Issue >> Jun 23, 2024 11:44 AM Annabella RAMAN wrote: Reason for CRM: 06/21/24 0838 Pend Interface, Surescripts Out  Please follow up with patient regarding refill status

## 2024-06-23 NOTE — Telephone Encounter (Signed)
 This medication is filled by cardiology and a request has been sent to them

## 2024-06-24 ENCOUNTER — Telehealth: Payer: Self-pay | Admitting: Cardiovascular Disease

## 2024-06-24 DIAGNOSIS — I10 Essential (primary) hypertension: Secondary | ICD-10-CM

## 2024-06-24 DIAGNOSIS — I4819 Other persistent atrial fibrillation: Secondary | ICD-10-CM

## 2024-06-24 MED ORDER — METOPROLOL SUCCINATE ER 50 MG PO TB24: ORAL_TABLET | ORAL | 0 refills | Status: DC

## 2024-06-24 NOTE — Telephone Encounter (Signed)
*  STAT* If patient is at the pharmacy, call can be transferred to refill team.   1. Which medications need to be refilled? (please list name of each medication and dose if known) metoprolol  succinate (TOPROL -XL) 50 MG 24 hr tablet   2. Which pharmacy/location (including street and city if local pharmacy) is medication to be sent to? [id number] - [place], [place] - [address]    3. Do they need a 30 day or 90 day supply? 90

## 2024-06-24 NOTE — Telephone Encounter (Signed)
 Pt's medication was sent to pt's pharmacy as requested. Confirmation received.

## 2024-07-06 ENCOUNTER — Other Ambulatory Visit: Payer: Self-pay | Admitting: Family Medicine

## 2024-07-06 DIAGNOSIS — E1169 Type 2 diabetes mellitus with other specified complication: Secondary | ICD-10-CM

## 2024-07-06 DIAGNOSIS — K2950 Unspecified chronic gastritis without bleeding: Secondary | ICD-10-CM

## 2024-07-11 ENCOUNTER — Other Ambulatory Visit: Payer: Self-pay | Admitting: Cardiovascular Disease

## 2024-07-11 DIAGNOSIS — I4819 Other persistent atrial fibrillation: Secondary | ICD-10-CM

## 2024-07-11 NOTE — Telephone Encounter (Signed)
 Prescription refill request for Eliquis  received. Indication:  Afib  Last office visit: 08/17/23 Jolayne)  Scr: 1.00 (12/11/23)  Age: 83 Weight: 103.5kg  Appropriate dose. Refill sent.

## 2024-08-08 ENCOUNTER — Other Ambulatory Visit: Payer: Self-pay | Admitting: Cardiovascular Disease

## 2024-08-10 ENCOUNTER — Other Ambulatory Visit: Payer: Self-pay | Admitting: Cardiovascular Disease

## 2024-08-14 NOTE — Progress Notes (Unsigned)
 Cardiology Office Note    Date:  08/15/2024  ID:  Vincent Collins, DOB 1941-01-07, MRN [id number] PCP:  Collins, Vincent LABOR, MD  Cardiologist:  Vincent ONEIDA Decent, MD  Electrophysiologist:  None   Chief Complaint: Follow up for PAF, HTN   History of Present Illness: .    Vincent Collins is a 83 y.o. male with visit-pertinent history of paroxysmal atrial fibrillation on Eliquis , ascending aorta dilation, HTN, HLD, type 2 diabetes mellitus, COPD, GERD and vertigo who is followed by Dr. Decent and presents today for follow up of hypertension.   In 02/2020 he was diagnosed with atrial fibrillation in the setting of pancreatitis.  Echocardiogram at that time showed LVEF of 65 to 70% with normal wall motion and moderate LVH, severely dilated left atrium and moderate dilation of the ascending aorta measuring 44 mm.  He was started on amiodarone  and DOAC with restoration of sinus rhythm.  In 04/2020 amiodarone  was stopped.  He has not had any documented recurrent atrial fibrillation since.  He was seen by Dr. Decent in 04/2023 for follow up, his blood pressure was elevated, he was otherwise stable from a cardiac standpoint. He was started on Spironolactone  for additional BP control.  He also reported problems with vertigo and was provided a prescription of meclizine . On 06/12/23 he had follow up with Pharm D through his PCP for hypertension, was noted to be well controlled at that time.   Patient was last seen in clinic on 08/17/2023 for follow-up for hypertension.  Patient reported that he had been doing well.  He reported he been monitoring his blood pressure at home averaging 130/80.  Patient's blood pressure was elevated in office however on review of his blood pressure log it is been averaging 130/80 at home.  He was continued on his current regimen.  Today he presents for follow-up.  He reports that he has been doing well, he notes some occasional chest discomfort after eating biscuits in the morning, he  notes this typically resolves after a few minutes.  He denies any chest pain, pressure or tightness on exertion.  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  He denies any palpitations or feeling of irregular heartbeats.  He reports that his blood pressure is typically well-controlled at home with systolics around 130, diastolic at 80.  He denies any cardiac concerns or complaints today. ROS: .   Today he denies shortness of breath, lower extremity edema, fatigue, palpitations, melena, hematuria, hemoptysis, diaphoresis, weakness, presyncope, syncope, orthopnea, and PND.  All other systems are reviewed and otherwise negative. Studies Reviewed: Vincent Collins   EKG:  EKG is ordered today, personally reviewed, demonstrating  EKG Interpretation Date/Time:  Monday August 15 2024 10:23:29 EDT Ventricular Rate:  59 PR Interval:  220 QRS Duration:  106 QT Interval:  440 QTC Calculation: 435 R Axis:   -25  Text Interpretation: Sinus bradycardia with 1st degree A-V block with occasional Premature ventricular complexes Left ventricular hypertrophy with repolarization abnormality ( R in aVL , Cornell product ) When compared with ECG of 17-Aug-2023 09:32, Premature ventricular complexes are now Present Confirmed by Vincent Collins [phone]) on 08/15/2024 10:35:36 AM   CV Studies: Cardiac studies reviewed are outlined and summarized above. Otherwise please see EMR for full report. Cardiac Studies & Procedures   ______________________________________________________________________________________________     ECHOCARDIOGRAM  ECHOCARDIOGRAM COMPLETE 09/09/2023  Narrative ECHOCARDIOGRAM REPORT    Patient Name:   Vincent Collins Date of Exam: 09/09/2023  Medical Rec #:  [id number]     Height:       68.0 in Accession #:    [id number]    Weight:       229.0 lb Date of Birth:  [birth year]     BSA:          2.165 m Patient Age:    82 years      BP:           168/88 mmHg Patient Gender: M              HR:           53 bpm. Exam Location:  [address]  Procedure: 2D Echo, 3D Echo, Cardiac Doppler, Color Doppler and Strain Analysis  Indications:     I77.810 Ascending Aorta Dilation  History:         Patient has prior history of Echocardiogram examinations, most recent 03/27/2020. COPD, Arrythmias:Atrial Fibrillation; Risk Factors:Hypertension, Diabetes and HLD.  Sonographer:     Vincent Collins RCS Referring Phys:  [id number] XJUOBW D Elmor Collins Diagnosing Phys: Vincent Nanas MD  IMPRESSIONS   1. Left ventricular ejection fraction, by estimation, is 50 to 55%. The left ventricle has low normal function. The left ventricle has no regional wall motion abnormalities. There is mild left ventricular hypertrophy. Left ventricular diastolic parameters are consistent with Grade II diastolic dysfunction (pseudonormalization). Elevated left atrial pressure. 2. Right ventricular systolic function is normal. The right ventricular size is normal. There is normal pulmonary artery systolic pressure. The estimated right ventricular systolic pressure is 18.7 mmHg. 3. The mitral valve is normal in structure. Trivial mitral valve regurgitation. No evidence of mitral stenosis. 4. The aortic valve was not well visualized. Aortic valve regurgitation is trivial. Aortic valve sclerosis/calcification is present, without any evidence of aortic stenosis. 5. Aortic dilatation noted. Aneurysm of the ascending aorta, measuring 46 mm. There is dilatation of the aortic root, measuring 42 mm. 6. The inferior vena cava is normal in size with greater than 50% respiratory variability, suggesting right atrial pressure of 3 mmHg.  FINDINGS Left Ventricle: Left ventricular ejection fraction, by estimation, is 50 to 55%. The left ventricle has low normal function. The left ventricle has no regional wall motion abnormalities. The left ventricular internal cavity size was normal in size. There is mild left ventricular hypertrophy.  Left ventricular diastolic parameters are consistent with Grade II diastolic dysfunction (pseudonormalization). Elevated left atrial pressure.  Right Ventricle: The right ventricular size is normal. No increase in right ventricular wall thickness. Right ventricular systolic function is normal. There is normal pulmonary artery systolic pressure. The tricuspid regurgitant velocity is 1.98 m/s, and with an assumed right atrial pressure of 3 mmHg, the estimated right ventricular systolic pressure is 18.7 mmHg.  Left Atrium: Left atrial size was normal in size.  Right Atrium: Right atrial size was normal in size.  Pericardium: There is no evidence of pericardial effusion.  Mitral Valve: The mitral valve is normal in structure. Trivial mitral valve regurgitation. No evidence of mitral valve stenosis.  Tricuspid Valve: The tricuspid valve is normal in structure. Tricuspid valve regurgitation is trivial.  Aortic Valve: The aortic valve was not well visualized. Aortic valve regurgitation is trivial. Aortic regurgitation PHT measures 1136 msec. Aortic valve sclerosis/calcification is present, without any evidence of aortic stenosis.  Pulmonic Valve: The pulmonic valve was not well visualized. Pulmonic valve regurgitation is trivial.  Aorta: Aortic dilatation noted. There is dilatation of the aortic root, measuring 42 mm. There  is an aneurysm involving the ascending aorta measuring 46 mm.  Venous: The inferior vena cava is normal in size with greater than 50% respiratory variability, suggesting right atrial pressure of 3 mmHg.  IAS/Shunts: The interatrial septum was not well visualized.   LEFT VENTRICLE PLAX 2D LVIDd:         5.30 cm   Diastology LVIDs:         3.70 cm   LV e' medial:    2.94 cm/s LV PW:         1.30 cm   LV E/e' medial:  26.7 LV IVS:        1.30 cm   LV e' lateral:   3.15 cm/s LVOT diam:     2.60 cm   LV E/e' lateral: 25.0 LV SV:         97 LV SV Index:   45        2D  Longitudinal Strain LVOT Area:     5.31 cm  2D Strain GLS (A2C):   -14.4 % 2D Strain GLS (A3C):   -17.3 % 2D Strain GLS (A4C):   -17.1 % 2D Strain GLS Avg:     -16.3 %  3D Volume EF: 3D EF:        51 % LV EDV:       181 ml LV ESV:       89 ml LV SV:        92 ml  RIGHT VENTRICLE RV Basal diam:  3.30 cm RV S prime:     14.80 cm/s TAPSE (M-mode): 1.7 cm RVSP:           18.7 mmHg  LEFT ATRIUM           Index        RIGHT ATRIUM           Index LA diam:      5.20 cm 2.40 cm/m   RA Pressure: 3.00 mmHg LA Vol (A4C): 45.0 ml 20.79 ml/m  RA Area:     18.50 cm RA Volume:   47.30 ml  21.85 ml/m AORTIC VALVE LVOT Vmax:   79.00 cm/s LVOT Vmean:  51.650 cm/s LVOT VTI:    0.184 m AI PHT:      1136 msec  AORTA Ao Root diam: 4.20 cm Ao Asc diam:  4.60 cm  MITRAL VALVE                TRICUSPID VALVE MV Area (PHT):              TR Peak grad:   15.7 mmHg MV Decel Time:              TR Vmax:        198.00 cm/s MV E velocity: 78.60 cm/s   Estimated RAP:  3.00 mmHg MV A velocity: 108.00 cm/s  RVSP:           18.7 mmHg MV E/A ratio:  0.73 SHUNTS Systemic VTI:  0.18 m Systemic Diam: 2.60 cm  Vincent Nanas MD Electronically signed by Vincent Nanas MD Signature Date/Time: 09/09/2023/3:57:01 PM    Final (Updated)          ______________________________________________________________________________________________       Current Reported Medications:.    Current Meds  Medication Sig   acetaminophen  (TYLENOL ) 325 MG tablet Take 2 tablets (650 mg total) by mouth every 6 (six) hours as needed for mild pain (or Fever >/= 101).   albuterol  (PROVENTIL ) (  2.5 MG/3ML) 0.083% nebulizer solution USE 1 VIAL VIA NEBULIZER EVERY 6 HOURS AS NEEDED FOR WHEEZING OR  SHORTNESS OF BREATH   apixaban  (ELIQUIS ) 5 MG TABS tablet Take 1 tablet by mouth twice daily   atorvastatin  (LIPITOR) 40 MG tablet TAKE 1 TABLET BY MOUTH DAILY   azelastine  (ASTELIN ) 0.1 % nasal spray Place 1 spray  into both nostrils 2 (two) times daily. For runny nose   Blood Glucose Monitoring Suppl (ACCU-CHEK GUIDE) w/Device KIT TEST BLOOD SUGAR DAILY; DX: E11.9.   Cholecalciferol  (VITAMIN D ) 1000 UNITS capsule Take 1,000 Units by mouth in the morning.   Coenzyme Q10-Fish Oil -Vit E (CO-Q 10 OMEGA-3 FISH OIL ) CAPS Take 1 capsule by mouth in the morning. 100mg  Capsule Qunol   dapagliflozin  propanediol (FARXIGA ) 10 MG TABS tablet Take 1 tablet (10 mg total) by mouth daily before breakfast.   fluticasone  (FLONASE ) 50 MCG/ACT nasal spray USE 1 SPRAY IN BOTH  NOSTRILS ONCE DAILY   glucose blood (ACCU-CHEK GUIDE) test strip TEST BLOOD SUGAR DAILY; DX: E11.9.   lisinopril  (ZESTRIL ) 40 MG tablet Take 1 tablet by mouth once daily   meclizine  (ANTIVERT ) 25 MG tablet Take 1 tablet (25 mg total) by mouth 3 (three) times daily as needed for dizziness.   metFORMIN  (GLUCOPHAGE -XR) 500 MG 24 hr tablet TAKE 1 TABLET BY MOUTH TWICE  DAILY   metoprolol  succinate (TOPROL -XL) 50 MG 24 hr tablet TAKE 1 TABLET BY MOUTH DAILY  WITH OR IMMEDIATELY FOLLOWING A  MEAL   Microlet Lancets MISC USE TO TEST BLOOD SUGAR DAILY; DX: E11.9. PATIENT HAS GLUCOMETER AND LANCING DEVICE   omeprazole  (PRILOSEC) 20 MG capsule TAKE 1 CAPSULE BY MOUTH DAILY   tamsulosin  (FLOMAX ) 0.4 MG CAPS capsule TAKE 2 CAPSULES BY MOUTH DAILY  AFTER SUPPER   [DISCONTINUED] spironolactone  (ALDACTONE ) 25 MG tablet Take 1 tablet by mouth once daily    Physical Exam:    VS:  BP 138/78   Pulse 62   Ht 5' 8 (1.727 m)   Wt 227 lb 12.8 oz (103.3 kg)   SpO2 97%   BMI 34.64 kg/m    Wt Readings from Last 3 Encounters:  08/15/24 227 lb 12.8 oz (103.3 kg)  06/13/24 228 lb 2 oz (103.5 kg)  06/10/24 225 lb (102.1 kg)    GEN: Well nourished, well developed in no acute distress NECK: No JVD; No carotid bruits CARDIAC: RRR, no murmurs, rubs, gallops RESPIRATORY:  Clear to auscultation without rales, wheezing or rhonchi  ABDOMEN: Soft, non-tender,  non-distended EXTREMITIES:  No edema; No acute deformity     Asessement and Plan:Vincent Collins    Hypertension: Initial blood pressure today 150/74, on my recheck was 130/78.  Patient reports that his home blood pressure prior to appointment was 135/80, he reports this is typically how it runs at home, he reports that it is always elevated when he is at office visits.  He will continue to monitor at home and notify the office if consistently elevated above 140/90.  Continue lisinopril  40 mg daily, metoprolol  succinate 50 mg daily and spironolactone  25 mg daily.  Chest discomfort: Patient reports that he will occasionally have a left-sided chest discomfort that occurs after he eats biscuits in the morning, typically resolves within a few minutes.  He reports that he has not had a recurrence in recent weeks.  He denies any chest pain, pressure or tightness on exertion.  Overall sounds GI in nature however discussed completing a stress test, patient deferred at this time, he  will notify the office if he has recurrence or more persistent chest discomfort.  Reviewed ED precautions.  Paroxysmal atrial fibrillation: Diagnosed in 02/2020 in setting of acute pancreatitis.  No documented recurrence.  Patient denies any palpitations or feeling of irregular heartbeats. EKG today indicates sinus bradycardia with first-degree AV block and occasional PVCs. He denies any bleeding problems on Eliquis .  Continue Eliquis  5 mg twice daily and Toprol -XL 50 mg daily.  Check CBC and BMET today.   Diabetes mellitus type 2: Last hemoglobin A1c 6.4% on 06/13/2024.  Monitored and managed per PCP.  HLD: Last lipid profile on 12/11/2023 indicated total cholesterol 110, HDL 44, triglycerides 90 and LDL 48. Continue atorvastatin  40 mg daily.   Ascending aorta dilation: CT angio chest aorta in 08/2023 indicated dilation of the ascending aorta measuring 44 mm.  Check CT angio chest aorta.  Check BMET today.   COPD: Patient denies any recent  worsening in his breathing, feels that this is overall stable. Monitored and managed per PCP.    Disposition: F/u with Dr. Barbaraann in six months.   Signed, Naveen Lorusso D Ezzie Senat, NP

## 2024-08-15 ENCOUNTER — Encounter: Payer: Self-pay | Admitting: Cardiology

## 2024-08-15 ENCOUNTER — Ambulatory Visit: Attending: Cardiology | Admitting: Cardiology

## 2024-08-15 VITALS — BP 138/78 | HR 62 | Ht 68.0 in | Wt 227.8 lb

## 2024-08-15 DIAGNOSIS — E782 Mixed hyperlipidemia: Secondary | ICD-10-CM | POA: Diagnosis not present

## 2024-08-15 DIAGNOSIS — I4819 Other persistent atrial fibrillation: Secondary | ICD-10-CM | POA: Diagnosis not present

## 2024-08-15 DIAGNOSIS — I10 Essential (primary) hypertension: Secondary | ICD-10-CM | POA: Diagnosis not present

## 2024-08-15 DIAGNOSIS — J449 Chronic obstructive pulmonary disease, unspecified: Secondary | ICD-10-CM

## 2024-08-15 DIAGNOSIS — I77810 Thoracic aortic ectasia: Secondary | ICD-10-CM | POA: Diagnosis not present

## 2024-08-15 DIAGNOSIS — Z79899 Other long term (current) drug therapy: Secondary | ICD-10-CM

## 2024-08-15 MED ORDER — SPIRONOLACTONE 25 MG PO TABS: 25.0000 mg | ORAL_TABLET | Freq: Every day | ORAL | 3 refills | Status: AC

## 2024-08-15 NOTE — Patient Instructions (Signed)
 Medication Instructions:  No changes *If you need a refill on your cardiac medications before your next appointment, please call your pharmacy*  Lab Work: Today we are going to have a CBC and Bmet get drawn. If you have labs (blood work) drawn today and your tests are completely normal, you will receive your results only by: MyChart Message (if you have MyChart) OR A paper copy in the mail If you have any lab test that is abnormal or we need to change your treatment, we will call you to review the results.  Testing/Procedures: CT-scan of the chest/Aorta   Follow-Up: At [facility], you and your health needs are our priority.  As part of our continuing mission to provide you with exceptional heart care, our providers are all part of one team.  This team includes your primary Cardiologist (physician) and Advanced Practice Providers or APPs (Physician Assistants and Nurse Practitioners) who all work together to provide you with the care you need, when you need it.  Your next appointment:   6 month(s)  Provider:   Darryle ONEIDA Decent, MD    We recommend signing up for the patient portal called MyChart.  Sign up information is provided on this After Visit Summary.  MyChart is used to connect with patients for Virtual Visits (Telemedicine).  Patients are able to view lab/test results, encounter notes, upcoming appointments, etc.  Non-urgent messages can be sent to your provider as well.   To learn more about what you can do with MyChart, go to [email or web address].

## 2024-08-16 ENCOUNTER — Ambulatory Visit: Payer: Self-pay | Admitting: Cardiology

## 2024-08-16 LAB — BASIC METABOLIC PANEL WITH GFR
BUN/Creatinine Ratio: 11 (ref 10–24)
BUN: 12 mg/dL (ref 8–27)
CO2: 24 mmol/L (ref 20–29)
Calcium: 9 mg/dL (ref 8.6–10.2)
Chloride: 100 mmol/L (ref 96–106)
Creatinine, Ser: 1.05 mg/dL (ref 0.76–1.27)
Glucose: 113 mg/dL — AB (ref 70–99)
Potassium: 4.1 mmol/L (ref 3.5–5.2)
Sodium: 139 mmol/L (ref 134–144)
eGFR: 71 mL/min/1.73 (ref >59)

## 2024-08-16 LAB — CBC
Hematocrit: 48.6 % (ref 37.5–51.0)
Hemoglobin: 15.7 g/dL (ref 13.0–17.7)
MCH: 29.6 pg (ref 26.6–33.0)
MCHC: 32.3 g/dL (ref 31.5–35.7)
MCV: 92 fL (ref 79–97)
Platelets: 175 x10E3/uL (ref 150–450)
RBC: 5.3 x10E6/uL (ref 4.14–5.80)
RDW: 13 % (ref 11.6–15.4)
WBC: 8.8 x10E3/uL (ref 3.4–10.8)

## 2024-08-17 NOTE — Telephone Encounter (Signed)
 Called patient advised of below they verbalized understanding.

## 2024-08-17 NOTE — Telephone Encounter (Signed)
-----   Message from Katlyn D [place] sent at 08/16/2024  1:37 PM EDT ----- Please let Vincent Collins know that his CBC shows no evidence of anemia or infection, his BMET shows normal kidney function and electrolytes.  Good results, continue current medications and follow-up as  planned. ----- Message ----- From: Rebecka Memos Lab Results In Sent: 08/15/2024  11:36 PM EDT To: Katlyn D [place], NP

## 2024-09-05 ENCOUNTER — Other Ambulatory Visit: Payer: Self-pay | Admitting: Cardiovascular Disease

## 2024-09-05 DIAGNOSIS — I152 Hypertension secondary to endocrine disorders: Secondary | ICD-10-CM

## 2024-09-14 ENCOUNTER — Other Ambulatory Visit: Payer: Self-pay | Admitting: Family Medicine

## 2024-09-14 DIAGNOSIS — K2950 Unspecified chronic gastritis without bleeding: Secondary | ICD-10-CM

## 2024-09-14 DIAGNOSIS — E1169 Type 2 diabetes mellitus with other specified complication: Secondary | ICD-10-CM

## 2024-09-16 ENCOUNTER — Other Ambulatory Visit: Payer: Self-pay | Admitting: Cardiovascular Disease

## 2024-09-16 DIAGNOSIS — I10 Essential (primary) hypertension: Secondary | ICD-10-CM

## 2024-09-16 DIAGNOSIS — I4819 Other persistent atrial fibrillation: Secondary | ICD-10-CM

## 2024-09-21 ENCOUNTER — Ambulatory Visit (INDEPENDENT_AMBULATORY_CARE_PROVIDER_SITE_OTHER): Admitting: Family Medicine

## 2024-09-21 ENCOUNTER — Encounter: Payer: Self-pay | Admitting: Family Medicine

## 2024-09-21 VITALS — BP 177/79 | HR 73 | Ht 68.0 in | Wt 228.0 lb

## 2024-09-21 DIAGNOSIS — E1169 Type 2 diabetes mellitus with other specified complication: Secondary | ICD-10-CM | POA: Diagnosis not present

## 2024-09-21 DIAGNOSIS — K2950 Unspecified chronic gastritis without bleeding: Secondary | ICD-10-CM

## 2024-09-21 DIAGNOSIS — Z7984 Long term (current) use of oral hypoglycemic drugs: Secondary | ICD-10-CM | POA: Diagnosis not present

## 2024-09-21 DIAGNOSIS — I4819 Other persistent atrial fibrillation: Secondary | ICD-10-CM | POA: Diagnosis not present

## 2024-09-21 DIAGNOSIS — I10 Essential (primary) hypertension: Secondary | ICD-10-CM | POA: Diagnosis not present

## 2024-09-21 DIAGNOSIS — E785 Hyperlipidemia, unspecified: Secondary | ICD-10-CM

## 2024-09-21 DIAGNOSIS — Z23 Encounter for immunization: Secondary | ICD-10-CM

## 2024-09-21 DIAGNOSIS — J449 Chronic obstructive pulmonary disease, unspecified: Secondary | ICD-10-CM | POA: Diagnosis not present

## 2024-09-21 LAB — BAYER DCA HB A1C WAIVED: HB A1C (BAYER DCA - WAIVED): 6.4 % — ABNORMAL HIGH (ref 4.8–5.6)

## 2024-09-21 LAB — LIPID PANEL

## 2024-09-21 MED ORDER — OMEPRAZOLE 20 MG PO CPDR: 20.0000 mg | DELAYED_RELEASE_CAPSULE | Freq: Every day | ORAL | 0 refills | Status: DC

## 2024-09-21 MED ORDER — METFORMIN HCL ER 500 MG PO TB24: 500.0000 mg | ORAL_TABLET | Freq: Two times a day (BID) | ORAL | 0 refills | Status: DC

## 2024-09-21 MED ORDER — ATORVASTATIN CALCIUM 40 MG PO TABS: 40.0000 mg | ORAL_TABLET | Freq: Every day | ORAL | 0 refills | Status: DC

## 2024-09-21 MED ORDER — DAPAGLIFLOZIN PROPANEDIOL 10 MG PO TABS: 10.0000 mg | ORAL_TABLET | Freq: Every day | ORAL | Status: DC

## 2024-09-21 NOTE — Progress Notes (Unsigned)
 Ht 5' 8 (1.727 m)   BMI 34.64 kg/m    Subjective:   Patient ID: Vincent Collins, male    DOB: 1941/01/27, 83 y.o.   MRN: [id number]  HPI: Vincent Collins is a 83 y.o. male presenting on 09/21/2024 for Medical Management of Chronic Issues, Diabetes, and Hypertension   Discussed the use of AI scribe software for clinical note transcription with the patient, who gave verbal consent to proceed.  History of Present Illness              Relevant past medical, surgical, family and social history reviewed and updated as indicated. Interim medical history since our last visit reviewed. Allergies and medications reviewed and updated.  Review of Systems  Constitutional:  Negative for chills and fever.  Eyes:  Negative for visual disturbance.  Respiratory:  Negative for shortness of breath and wheezing.   Cardiovascular:  Negative for chest pain and leg swelling.  Musculoskeletal:  Negative for arthralgias, back pain and gait problem.  Skin:  Negative for rash.  Neurological:  Positive for dizziness.  All other systems reviewed and are negative.   Per HPI unless specifically indicated above   Allergies as of 09/21/2024       Reactions   Sulfa Antibiotics Other (See Comments)   Unsure of reaction        Medication List        Accurate as of September 21, 2024  8:27 AM. If you have any questions, ask your nurse or doctor.          Accu-Chek Guide test strip Generic drug: glucose blood TEST BLOOD SUGAR DAILY; DX: E11.9.   Accu-Chek Guide w/Device Kit TEST BLOOD SUGAR DAILY; DX: E11.9.   acetaminophen  325 MG tablet Commonly known as: TYLENOL  Take 2 tablets (650 mg total) by mouth every 6 (six) hours as needed for mild pain (or Fever >/= 101).   albuterol  (2.5 MG/3ML) 0.083% nebulizer solution Commonly known as: PROVENTIL  USE 1 VIAL VIA NEBULIZER EVERY 6 HOURS AS NEEDED FOR WHEEZING OR  SHORTNESS OF BREATH   atorvastatin  40 MG tablet Commonly known as:  LIPITOR Take 1 tablet (40 mg total) by mouth daily.   azelastine  0.1 % nasal spray Commonly known as: ASTELIN  Place 1 spray into both nostrils 2 (two) times daily. For runny nose   benzonatate  100 MG capsule Commonly known as: Tessalon  Perles Take 1 capsule (100 mg total) by mouth 3 (three) times daily as needed for cough.   CO-Q 10 Omega-3 Fish Oil  Caps Take 1 capsule by mouth in the morning. 100mg  Capsule Qunol   dapagliflozin  propanediol 10 MG Tabs tablet Commonly known as: Farxiga  Take 1 tablet (10 mg total) by mouth daily before breakfast.   desloratadine  5 MG tablet Commonly known as: Clarinex  Take 1 tablet (5 mg total) by mouth daily. For allergies/ runny nose   Eliquis  5 MG Tabs tablet Generic drug: apixaban  Take 1 tablet by mouth twice daily   fluticasone  50 MCG/ACT nasal spray Commonly known as: FLONASE  USE 1 SPRAY IN BOTH  NOSTRILS ONCE DAILY   lisinopril  40 MG tablet Commonly known as: ZESTRIL  Take 1 tablet by mouth once daily   meclizine  25 MG tablet Commonly known as: ANTIVERT  Take 1 tablet (25 mg total) by mouth 3 (three) times daily as needed for dizziness.   metFORMIN  500 MG 24 hr tablet Commonly known as: GLUCOPHAGE -XR Take 1 tablet (500 mg total) by mouth 2 (two) times daily.   metoprolol   succinate 50 MG 24 hr tablet Commonly known as: TOPROL -XL TAKE 1 TABLET BY MOUTH ONCE DAILY WITH OR IMMEDIATELY FOLLOWING A MEAL. APPOINTMENT REQUIRED FOR FUTURE REFILLS   Microlet Lancets Misc USE TO TEST BLOOD SUGAR DAILY; DX: E11.9. PATIENT HAS GLUCOMETER AND LANCING DEVICE   omeprazole  20 MG capsule Commonly known as: PRILOSEC Take 1 capsule (20 mg total) by mouth daily.   spironolactone  25 MG tablet Commonly known as: ALDACTONE  Take 1 tablet (25 mg total) by mouth daily.   tamsulosin  0.4 MG Caps capsule Commonly known as: FLOMAX  TAKE 2 CAPSULES BY MOUTH DAILY  AFTER SUPPER   Vitamin D  1000 units capsule Take 1,000 Units by mouth in the morning.          Objective:   Ht 5' 8 (1.727 m)   BMI 34.64 kg/m   Wt Readings from Last 3 Encounters:  08/15/24 227 lb 12.8 oz (103.3 kg)  06/13/24 228 lb 2 oz (103.5 kg)  06/10/24 225 lb (102.1 kg)    Physical Exam Vitals and nursing note reviewed.  Constitutional:      Appearance: Normal appearance. He is obese.  Neurological:     Mental Status: He is alert.    Physical Exam             Assessment & Plan:   Problem List Items Addressed This Visit       Cardiovascular and Mediastinum   Essential hypertension   Relevant Medications   atorvastatin  (LIPITOR) 40 MG tablet   Persistent atrial fibrillation (HCC)   Relevant Medications   atorvastatin  (LIPITOR) 40 MG tablet     Respiratory   COPD (chronic obstructive pulmonary disease) (HCC)     Endocrine   Hyperlipidemia associated with type 2 diabetes mellitus (HCC) - Primary   Relevant Medications   atorvastatin  (LIPITOR) 40 MG tablet   dapagliflozin  propanediol (FARXIGA ) 10 MG TABS tablet   metFORMIN  (GLUCOPHAGE -XR) 500 MG 24 hr tablet   Other Relevant Orders   Bayer DCA Hb A1c Waived   CBC with Differential/Platelet   CMP14+EGFR   Lipid panel   Type 2 diabetes mellitus (HCC)   Relevant Medications   atorvastatin  (LIPITOR) 40 MG tablet   dapagliflozin  propanediol (FARXIGA ) 10 MG TABS tablet   metFORMIN  (GLUCOPHAGE -XR) 500 MG 24 hr tablet   Other Visit Diagnoses       Chronic gastritis without bleeding, unspecified gastritis type       Relevant Medications   omeprazole  (PRILOSEC) 20 MG capsule                    Follow up plan: Return in about 3 months (around 12/21/2024) for Diabetes recheck.  Counseling provided for all of the vaccine components Orders Placed This Encounter  Procedures   Bayer DCA Hb A1c Waived   CBC with Differential/Platelet   CMP14+EGFR   Lipid panel    Fonda Levins, MD [facility] 09/21/2024, 8:27 AM

## 2024-09-22 LAB — CBC WITH DIFFERENTIAL/PLATELET
Basophils Absolute: 0.1 x10E3/uL (ref 0.0–0.2)
Basos: 1 %
EOS (ABSOLUTE): 0.3 x10E3/uL (ref 0.0–0.4)
Eos: 4 %
Hematocrit: 46.7 % (ref 37.5–51.0)
Hemoglobin: 15 g/dL (ref 13.0–17.7)
Immature Grans (Abs): 0 x10E3/uL (ref 0.0–0.1)
Immature Granulocytes: 0 %
Lymphocytes Absolute: 1.7 x10E3/uL (ref 0.7–3.1)
Lymphs: 23 %
MCH: 29.9 pg (ref 26.6–33.0)
MCHC: 32.1 g/dL (ref 31.5–35.7)
MCV: 93 fL (ref 79–97)
Monocytes Absolute: 0.6 x10E3/uL (ref 0.1–0.9)
Monocytes: 9 %
Neutrophils Absolute: 4.6 x10E3/uL (ref 1.4–7.0)
Neutrophils: 63 %
Platelets: 172 x10E3/uL (ref 150–450)
RBC: 5.02 x10E6/uL (ref 4.14–5.80)
RDW: 13.2 % (ref 11.6–15.4)
WBC: 7.3 x10E3/uL (ref 3.4–10.8)

## 2024-09-22 LAB — LIPID PANEL
Cholesterol, Total: 88 mg/dL — AB (ref 100–199)
HDL: 32 mg/dL — ABNORMAL LOW (ref >39)
LDL CALC COMMENT:: 2.8 ratio (ref 0.0–5.0)
LDL Chol Calc (NIH): 35 mg/dL (ref 0–99)
Triglycerides: 114 mg/dL (ref 0–149)
VLDL Cholesterol Cal: 21 mg/dL (ref 5–40)

## 2024-09-22 LAB — CMP14+EGFR
ALT: 10 IU/L (ref 0–44)
AST: 13 IU/L (ref 0–40)
Albumin: 3.7 g/dL (ref 3.7–4.7)
Alkaline Phosphatase: 86 IU/L (ref 48–129)
BUN/Creatinine Ratio: 14 (ref 10–24)
BUN: 15 mg/dL (ref 8–27)
Bilirubin Total: 0.9 mg/dL (ref 0.0–1.2)
CO2: 23 mmol/L (ref 20–29)
Calcium: 8.8 mg/dL (ref 8.6–10.2)
Chloride: 101 mmol/L (ref 96–106)
Creatinine, Ser: 1.08 mg/dL (ref 0.76–1.27)
Globulin, Total: 2.1 g/dL (ref 1.5–4.5)
Glucose: 161 mg/dL — ABNORMAL HIGH (ref 70–99)
Potassium: 4 mmol/L (ref 3.5–5.2)
Sodium: 138 mmol/L (ref 134–144)
Total Protein: 5.8 g/dL — ABNORMAL LOW (ref 6.0–8.5)
eGFR: 69 mL/min/1.73 (ref >59)

## 2024-09-26 ENCOUNTER — Ambulatory Visit: Payer: Self-pay | Admitting: Family Medicine

## 2024-12-16 ENCOUNTER — Encounter: Payer: Self-pay | Admitting: Family Medicine

## 2024-12-16 ENCOUNTER — Ambulatory Visit: Payer: Self-pay | Admitting: Family Medicine

## 2024-12-16 VITALS — BP 155/69 | HR 60 | Ht 68.0 in | Wt 227.0 lb

## 2024-12-16 DIAGNOSIS — I152 Hypertension secondary to endocrine disorders: Secondary | ICD-10-CM

## 2024-12-16 DIAGNOSIS — Z7984 Long term (current) use of oral hypoglycemic drugs: Secondary | ICD-10-CM

## 2024-12-16 DIAGNOSIS — E785 Hyperlipidemia, unspecified: Secondary | ICD-10-CM | POA: Diagnosis not present

## 2024-12-16 DIAGNOSIS — E1169 Type 2 diabetes mellitus with other specified complication: Secondary | ICD-10-CM

## 2024-12-16 DIAGNOSIS — E1159 Type 2 diabetes mellitus with other circulatory complications: Secondary | ICD-10-CM | POA: Diagnosis not present

## 2024-12-16 DIAGNOSIS — J449 Chronic obstructive pulmonary disease, unspecified: Secondary | ICD-10-CM | POA: Diagnosis not present

## 2024-12-16 DIAGNOSIS — K2950 Unspecified chronic gastritis without bleeding: Secondary | ICD-10-CM | POA: Diagnosis not present

## 2024-12-16 LAB — BAYER DCA HB A1C WAIVED: HB A1C (BAYER DCA - WAIVED): 6.1 % — ABNORMAL HIGH (ref 4.8–5.6)

## 2024-12-16 MED ORDER — DAPAGLIFLOZIN PROPANEDIOL 10 MG PO TABS: 10.0000 mg | ORAL_TABLET | Freq: Every day | ORAL | 3 refills | Status: AC

## 2024-12-16 MED ORDER — OMEPRAZOLE 20 MG PO CPDR: 20.0000 mg | DELAYED_RELEASE_CAPSULE | Freq: Every day | ORAL | 3 refills | Status: AC

## 2024-12-16 MED ORDER — METFORMIN HCL ER 500 MG PO TB24: 500.0000 mg | ORAL_TABLET | Freq: Two times a day (BID) | ORAL | 3 refills | Status: AC

## 2024-12-16 MED ORDER — ATORVASTATIN CALCIUM 40 MG PO TABS: 40.0000 mg | ORAL_TABLET | Freq: Every day | ORAL | 3 refills | Status: AC

## 2024-12-16 NOTE — Progress Notes (Signed)
 "  BP (!) 155/69   Pulse 60   Ht 5' 8 (1.727 m)   Wt 227 lb (103 kg)   SpO2 97%   BMI 34.52 kg/m    Subjective:   Patient ID: Vincent Collins, male    DOB: May 13, 1941, 83 y.o.   MRN: [id number]  HPI: Vincent Collins is a 83 y.o. male presenting on 12/16/2024 for Medical Management of Chronic Issues, Diabetes, Hypertension, and Hyperlipidemia   Discussed the use of AI scribe software for clinical note transcription with the patient, who gave verbal consent to proceed.  History of Present Illness   Vincent Collins is an 83 year old male with diabetes and hypertension who presents for a routine follow-up. He is accompanied by his wife.  Glycemic control - Diabetes managed with Farxiga  and metformin . - Recent hemoglobin A1c is 6.1, indicating good glycemic control. - Attributes success to dietary modifications and support from his wife.  Blood pressure management - Blood pressure fluctuates and tends to increase during periods of anxiety, particularly during medical visits. - Monitors blood pressure at home. - Currently taking lisinopril , metoprolol , and spironolactone  daily. - Has not seen his cardiologist in two years.          Relevant past medical, surgical, family and social history reviewed and updated as indicated. Interim medical history since our last visit reviewed. Allergies and medications reviewed and updated.  Review of Systems  Constitutional:  Negative for chills and fever.  Eyes:  Negative for discharge.  Respiratory:  Negative for shortness of breath and wheezing.   Cardiovascular:  Negative for chest pain and leg swelling.  Musculoskeletal:  Negative for back pain and gait problem.  Skin:  Negative for rash.  All other systems reviewed and are negative.   Per HPI unless specifically indicated above   Allergies as of 12/16/2024       Reactions   Sulfa Antibiotics Other (See Comments)   Unsure of reaction        Medication List        Accurate  as of December 16, 2024  1:36 PM. If you have any questions, ask your nurse or doctor.          Accu-Chek Guide test strip Generic drug: glucose blood TEST BLOOD SUGAR DAILY; DX: E11.9.   Accu-Chek Guide w/Device Kit TEST BLOOD SUGAR DAILY; DX: E11.9.   acetaminophen  325 MG tablet Commonly known as: TYLENOL  Take 2 tablets (650 mg total) by mouth every 6 (six) hours as needed for mild pain (or Fever >/= 101).   albuterol  (2.5 MG/3ML) 0.083% nebulizer solution Commonly known as: PROVENTIL  USE 1 VIAL VIA NEBULIZER EVERY 6 HOURS AS NEEDED FOR WHEEZING OR  SHORTNESS OF BREATH   atorvastatin  40 MG tablet Commonly known as: LIPITOR Take 1 tablet (40 mg total) by mouth daily.   azelastine  0.1 % nasal spray Commonly known as: ASTELIN  Place 1 spray into both nostrils 2 (two) times daily. For runny nose   CO-Q 10 Omega-3 Fish Oil  Caps Take 1 capsule by mouth in the morning. 100mg  Capsule Qunol   dapagliflozin  propanediol 10 MG Tabs tablet Commonly known as: Farxiga  Take 1 tablet (10 mg total) by mouth daily before breakfast.   Eliquis  5 MG Tabs tablet Generic drug: apixaban  Take 1 tablet by mouth twice daily   fluticasone  50 MCG/ACT nasal spray Commonly known as: FLONASE  USE 1 SPRAY IN BOTH  NOSTRILS ONCE DAILY   lisinopril  40 MG tablet Commonly known as:  ZESTRIL  Take 1 tablet by mouth once daily   meclizine  25 MG tablet Commonly known as: ANTIVERT  Take 1 tablet (25 mg total) by mouth 3 (three) times daily as needed for dizziness.   metFORMIN  500 MG 24 hr tablet Commonly known as: GLUCOPHAGE -XR Take 1 tablet (500 mg total) by mouth 2 (two) times daily.   metoprolol  succinate 50 MG 24 hr tablet Commonly known as: TOPROL -XL TAKE 1 TABLET BY MOUTH ONCE DAILY WITH OR IMMEDIATELY FOLLOWING A MEAL. APPOINTMENT REQUIRED FOR FUTURE REFILLS   Microlet Lancets Misc USE TO TEST BLOOD SUGAR DAILY; DX: E11.9. PATIENT HAS GLUCOMETER AND LANCING DEVICE   omeprazole  20 MG  capsule Commonly known as: PRILOSEC Take 1 capsule (20 mg total) by mouth daily.   spironolactone  25 MG tablet Commonly known as: ALDACTONE  Take 1 tablet (25 mg total) by mouth daily.   tamsulosin  0.4 MG Caps capsule Commonly known as: FLOMAX  TAKE 2 CAPSULES BY MOUTH DAILY  AFTER SUPPER   Vitamin D  1000 units capsule Take 1,000 Units by mouth in the morning.         Objective:   BP (!) 155/69   Pulse 60   Ht 5' 8 (1.727 m)   Wt 227 lb (103 kg)   SpO2 97%   BMI 34.52 kg/m   Wt Readings from Last 3 Encounters:  12/16/24 227 lb (103 kg)  09/21/24 228 lb (103.4 kg)  08/15/24 227 lb 12.8 oz (103.3 kg)    Physical Exam Vitals and nursing note reviewed.  Constitutional:      General: He is not in acute distress.    Appearance: He is well-developed. He is not diaphoretic.  Eyes:     General: No scleral icterus.    Conjunctiva/sclera: Conjunctivae normal.  Neck:     Thyroid: No thyromegaly.  Cardiovascular:     Rate and Rhythm: Normal rate and regular rhythm.     Heart sounds: Normal heart sounds. No murmur heard. Pulmonary:     Effort: Pulmonary effort is normal. No respiratory distress.     Breath sounds: Normal breath sounds. No wheezing.  Musculoskeletal:        General: No swelling. Normal range of motion.     Cervical back: Neck supple.  Lymphadenopathy:     Cervical: No cervical adenopathy.  Skin:    General: Skin is warm and dry.     Findings: No rash.  Neurological:     Mental Status: He is alert and oriented to person, place, and time.     Coordination: Coordination normal.  Psychiatric:        Behavior: Behavior normal.                 Assessment & Plan:   Problem List Items Addressed This Visit       Cardiovascular and Mediastinum   Hypertension associated with diabetes (HCC)   Relevant Medications   atorvastatin  (LIPITOR) 40 MG tablet   dapagliflozin  propanediol (FARXIGA ) 10 MG TABS tablet   metFORMIN  (GLUCOPHAGE -XR) 500 MG 24 hr  tablet     Respiratory   COPD (chronic obstructive pulmonary disease) (HCC)     Endocrine   Hyperlipidemia associated with type 2 diabetes mellitus (HCC)   Relevant Medications   atorvastatin  (LIPITOR) 40 MG tablet   dapagliflozin  propanediol (FARXIGA ) 10 MG TABS tablet   metFORMIN  (GLUCOPHAGE -XR) 500 MG 24 hr tablet   Type 2 diabetes mellitus (HCC) - Primary   Relevant Medications   atorvastatin  (LIPITOR) 40 MG tablet  dapagliflozin  propanediol (FARXIGA ) 10 MG TABS tablet   metFORMIN  (GLUCOPHAGE -XR) 500 MG 24 hr tablet   Other Relevant Orders   Bayer DCA Hb A1c Waived   Other Visit Diagnoses       Chronic gastritis without bleeding, unspecified gastritis type       Relevant Medications   omeprazole  (PRILOSEC) 20 MG capsule          Type 2 diabetes mellitus with circulatory complications Diabetes well-controlled, A1c 6.1. Adherent to diet and medication. - Continue Farxiga  and metformin . - Maintain dietary modifications.     Blood pressure elevated but he will keep a close eye on it and says it runs better at home.  A1c looks good.     Follow up plan: Return in about 3 months (around 03/16/2025), or if symptoms worsen or fail to improve, for Diabetes and hypertension and hyperlipidemia.  Counseling provided for all of the vaccine components Orders Placed This Encounter  Procedures   Bayer DCA Hb A1c Waived    Fonda Levins, MD [facility] 12/16/2024, 1:36 PM     "

## 2025-01-13 ENCOUNTER — Other Ambulatory Visit: Payer: Self-pay | Admitting: Cardiovascular Disease

## 2025-01-13 DIAGNOSIS — I4819 Other persistent atrial fibrillation: Secondary | ICD-10-CM

## 2025-01-13 NOTE — Telephone Encounter (Signed)
 Eliquis  5mg  refill request received. Patient is 84 years old, weight-103kg, Crea-1.08 on 09/21/24, Diagnosis-Afib, and last seen by Katlyn West on 08/15/24. Dose is appropriate based on dosing criteria. Will send in refill to requested pharmacy.

## 2025-06-13 ENCOUNTER — Ambulatory Visit: Payer: Self-pay
